# Patient Record
Sex: Male | Born: 1955 | Race: White | Hispanic: No | Marital: Single | Smoking: Never smoker
Health system: Southern US, Community
[De-identification: ages and names within clinical notes are randomized; demographics above are authoritative.]

## PROBLEM LIST (undated history)

## (undated) DIAGNOSIS — I219 Acute myocardial infarction, unspecified: Secondary | ICD-10-CM

## (undated) DIAGNOSIS — N186 End stage renal disease: Secondary | ICD-10-CM

## (undated) DIAGNOSIS — E119 Type 2 diabetes mellitus without complications: Secondary | ICD-10-CM

## (undated) DIAGNOSIS — I480 Paroxysmal atrial fibrillation: Secondary | ICD-10-CM

## (undated) DIAGNOSIS — G4733 Obstructive sleep apnea (adult) (pediatric): Secondary | ICD-10-CM

## (undated) DIAGNOSIS — I5030 Unspecified diastolic (congestive) heart failure: Secondary | ICD-10-CM

## (undated) DIAGNOSIS — N289 Disorder of kidney and ureter, unspecified: Secondary | ICD-10-CM

## (undated) HISTORY — PX: DIALYSIS FISTULA CREATION: SHX611

## (undated) HISTORY — PX: TOE AMPUTATION: SHX809

---

## 2015-07-11 HISTORY — PX: CORONARY ARTERY BYPASS GRAFT: SHX141

## 2020-01-17 ENCOUNTER — Emergency Department (HOSPITAL_COMMUNITY): Payer: Medicare Other

## 2020-01-17 ENCOUNTER — Encounter (HOSPITAL_COMMUNITY): Payer: Self-pay

## 2020-01-17 ENCOUNTER — Emergency Department (HOSPITAL_COMMUNITY)
Admission: EM | Admit: 2020-01-17 | Discharge: 2020-01-17 | Disposition: A | Discharge status: Home / Self Care | Payer: Medicare Other | Attending: Emergency Medicine | Admitting: Emergency Medicine

## 2020-01-17 ENCOUNTER — Other Ambulatory Visit: Payer: Self-pay

## 2020-01-17 DIAGNOSIS — E1122 Type 2 diabetes mellitus with diabetic chronic kidney disease: Secondary | ICD-10-CM | POA: Insufficient documentation

## 2020-01-17 DIAGNOSIS — S0990XA Unspecified injury of head, initial encounter: Secondary | ICD-10-CM | POA: Diagnosis not present

## 2020-01-17 DIAGNOSIS — Z23 Encounter for immunization: Secondary | ICD-10-CM | POA: Insufficient documentation

## 2020-01-17 DIAGNOSIS — Z794 Long term (current) use of insulin: Secondary | ICD-10-CM | POA: Insufficient documentation

## 2020-01-17 DIAGNOSIS — E1165 Type 2 diabetes mellitus with hyperglycemia: Secondary | ICD-10-CM | POA: Insufficient documentation

## 2020-01-17 DIAGNOSIS — Z89421 Acquired absence of other right toe(s): Secondary | ICD-10-CM | POA: Insufficient documentation

## 2020-01-17 DIAGNOSIS — N186 End stage renal disease: Secondary | ICD-10-CM | POA: Diagnosis not present

## 2020-01-17 DIAGNOSIS — S80812A Abrasion, left lower leg, initial encounter: Secondary | ICD-10-CM | POA: Insufficient documentation

## 2020-01-17 DIAGNOSIS — W19XXXA Unspecified fall, initial encounter: Secondary | ICD-10-CM

## 2020-01-17 DIAGNOSIS — Y9389 Activity, other specified: Secondary | ICD-10-CM | POA: Insufficient documentation

## 2020-01-17 DIAGNOSIS — S90819A Abrasion, unspecified foot, initial encounter: Secondary | ICD-10-CM | POA: Insufficient documentation

## 2020-01-17 DIAGNOSIS — Z992 Dependence on renal dialysis: Secondary | ICD-10-CM

## 2020-01-17 DIAGNOSIS — R739 Hyperglycemia, unspecified: Secondary | ICD-10-CM

## 2020-01-17 DIAGNOSIS — Y999 Unspecified external cause status: Secondary | ICD-10-CM | POA: Insufficient documentation

## 2020-01-17 DIAGNOSIS — Y92410 Unspecified street and highway as the place of occurrence of the external cause: Secondary | ICD-10-CM | POA: Insufficient documentation

## 2020-01-17 DIAGNOSIS — T07XXXA Unspecified multiple injuries, initial encounter: Secondary | ICD-10-CM

## 2020-01-17 LAB — I-STAT CHEM 8, ED
BUN: 47 mg/dL — ABNORMAL HIGH (ref 8–23)
Calcium, Ion: 0.97 mmol/L — ABNORMAL LOW (ref 1.15–1.40)
Chloride: 100 mmol/L (ref 98–111)
Creatinine, Ser: 10.4 mg/dL — ABNORMAL HIGH (ref 0.61–1.24)
Glucose, Bld: 383 mg/dL — ABNORMAL HIGH (ref 70–99)
HCT: 27 % — ABNORMAL LOW (ref 39.0–52.0)
Hemoglobin: 9.2 g/dL — ABNORMAL LOW (ref 13.0–17.0)
Potassium: 5 mmol/L (ref 3.5–5.1)
Sodium: 137 mmol/L (ref 135–145)
TCO2: 23 mmol/L (ref 22–32)

## 2020-01-17 LAB — COMPREHENSIVE METABOLIC PANEL
ALT: 11 U/L (ref 0–44)
AST: 25 U/L (ref 15–41)
Albumin: 3 g/dL — ABNORMAL LOW (ref 3.5–5.0)
Alkaline Phosphatase: 98 U/L (ref 38–126)
Anion gap: 15 (ref 5–15)
BUN: 47 mg/dL — ABNORMAL HIGH (ref 8–23)
CO2: 21 mmol/L — ABNORMAL LOW (ref 22–32)
Calcium: 8.1 mg/dL — ABNORMAL LOW (ref 8.9–10.3)
Chloride: 100 mmol/L (ref 98–111)
Creatinine, Ser: 9.28 mg/dL — ABNORMAL HIGH (ref 0.61–1.24)
GFR calc Af Amer: 6 mL/min — ABNORMAL LOW (ref >60)
GFR calc non Af Amer: 5 mL/min — ABNORMAL LOW (ref >60)
Glucose, Bld: 384 mg/dL — ABNORMAL HIGH (ref 70–99)
Potassium: 5.1 mmol/L (ref 3.5–5.1)
Sodium: 136 mmol/L (ref 135–145)
Total Bilirubin: 0.7 mg/dL (ref 0.3–1.2)
Total Protein: 6.7 g/dL (ref 6.5–8.1)

## 2020-01-17 LAB — CBC
HCT: 28.6 % — ABNORMAL LOW (ref 39.0–52.0)
Hemoglobin: 8.7 g/dL — ABNORMAL LOW (ref 13.0–17.0)
MCH: 29.5 pg (ref 26.0–34.0)
MCHC: 30.4 g/dL (ref 30.0–36.0)
MCV: 96.9 fL (ref 80.0–100.0)
Platelets: 235 10*3/uL (ref 150–400)
RBC: 2.95 MIL/uL — ABNORMAL LOW (ref 4.22–5.81)
RDW: 14.8 % (ref 11.5–15.5)
WBC: 13.2 10*3/uL — ABNORMAL HIGH (ref 4.0–10.5)
nRBC: 0 % (ref 0.0–0.2)

## 2020-01-17 LAB — PROTIME-INR
INR: 1.1 (ref 0.8–1.2)
Prothrombin Time: 13.7 seconds (ref 11.4–15.2)

## 2020-01-17 LAB — SAMPLE TO BLOOD BANK

## 2020-01-17 IMAGING — DX DG CHEST 1V PORT
1 series · 1 of 1 positions shown · non-contrast
Comparison: None.

CLINICAL DATA: Acute pain due to trauma

EXAM:
PORTABLE CHEST 1 VIEW

[chest ap]
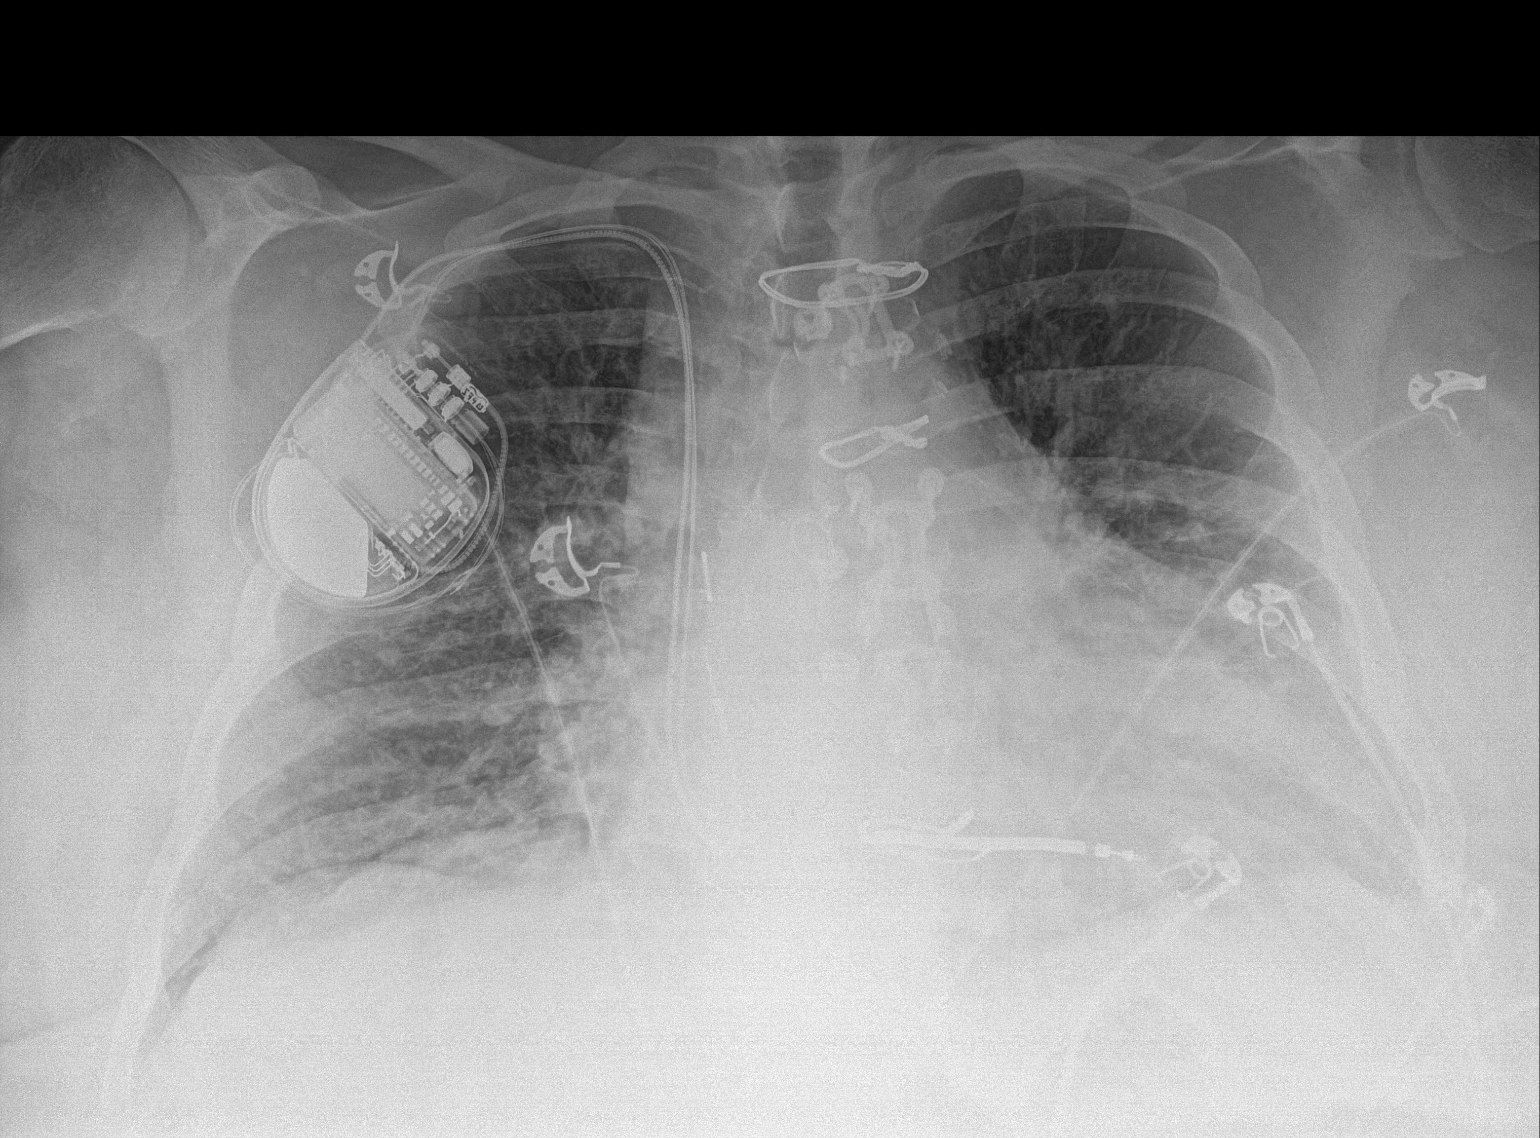

[1 of 1 positions shown; findings below may reference images not displayed]

FINDINGS: The heart size is enlarged. The patient is status post prior median
sternotomy. There is a dual chamber right-sided pacemaker/ICD in
place. There is vascular congestion with developing interstitial
edema. There is no pneumothorax. Bibasilar atelectasis is noted.
IMPRESSION: 1. Cardiomegaly with developing interstitial edema.
2. Bibasilar airspace opacities favored to represent atelectasis.

## 2020-01-17 IMAGING — CT CT HEAD W/O CM
4 series · 16 of 47 positions shown, 18 images · non-contrast
Comparison: None.

CLINICAL DATA: Fell out of wheelchair approximately 15-20 feet,
anticoagulated

EXAM:
CT HEAD WITHOUT CONTRAST
CT CERVICAL SPINE WITHOUT CONTRAST
TECHNIQUE: Multidetector CT imaging of the head and cervical spine was
performed following the standard protocol without intravenous
contrast. Multiplanar CT image reconstructions of the cervical spine
were also generated.

[Series 3: head wo · axial · 0.41mm/px · z∈[-122,-2]mm · 7 of 34 slices shown, 9 images]
[im 5/34  brain]
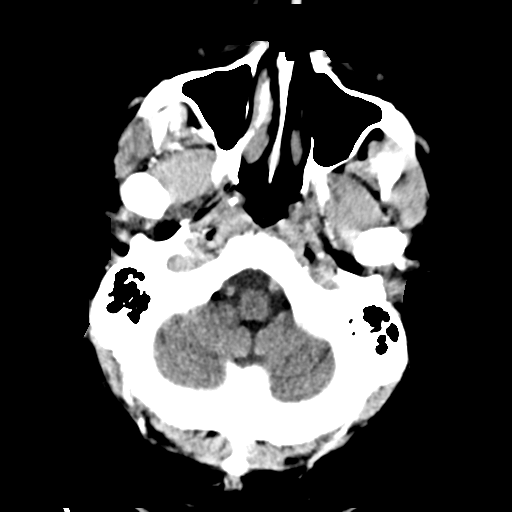
[im 5/34  bone]
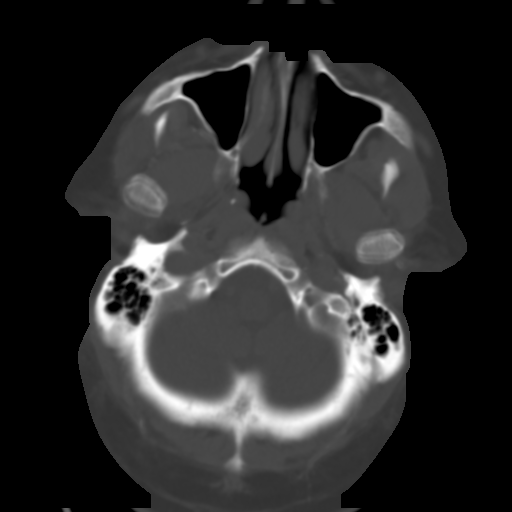
[im 9/34  brain]
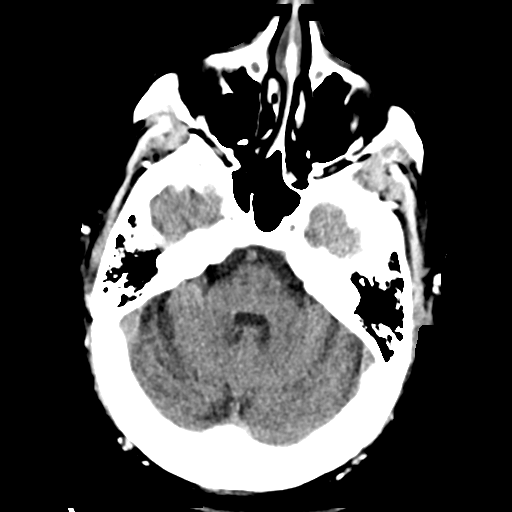
[im 13/34  brain]
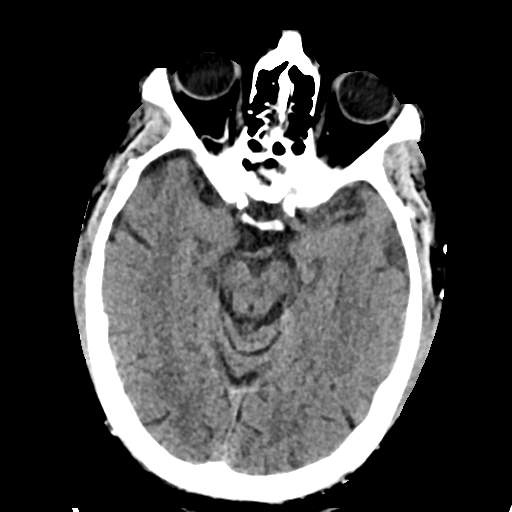
[im 17/34  brain]
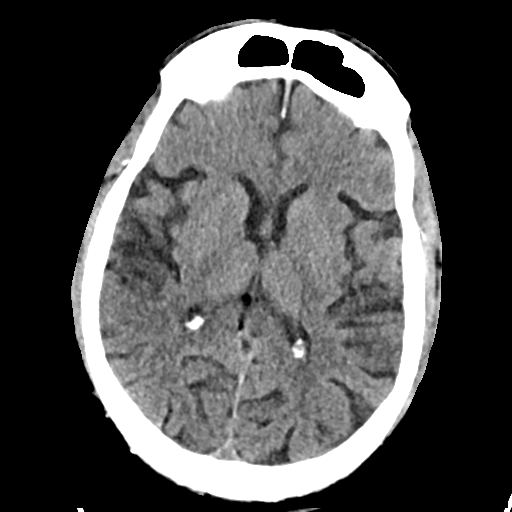
[im 21/34  brain]
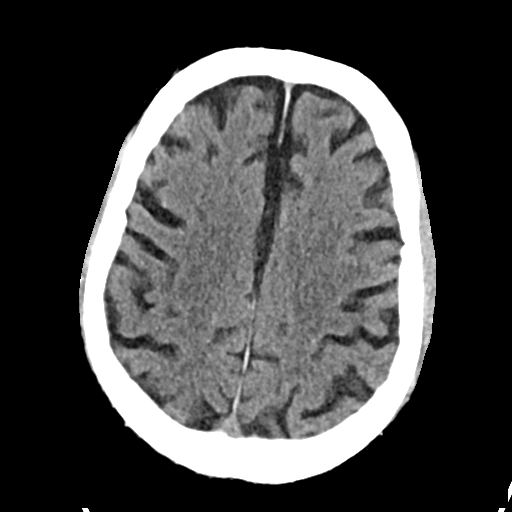
[im 21/34  bone]
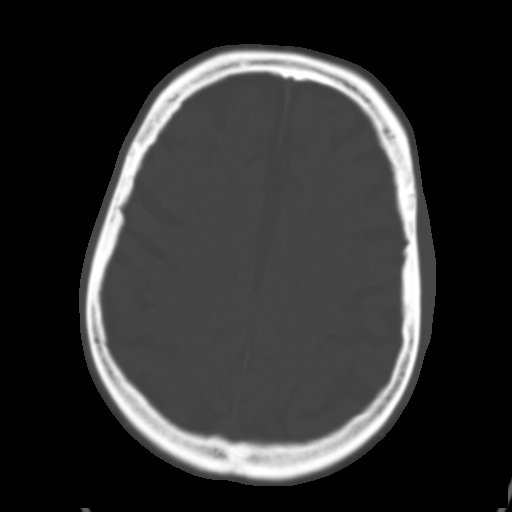
[im 25/34  brain]
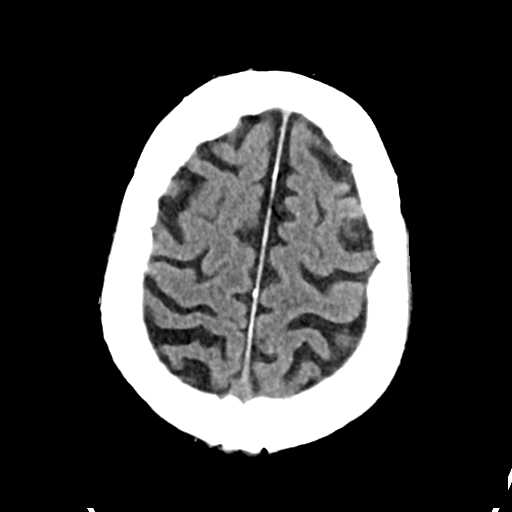
[im 29/34  brain]
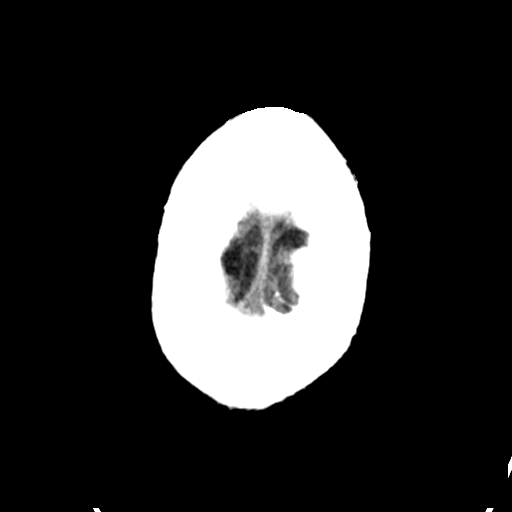

[Series 4: head bone · axial · 0.41mm/px · z∈[-126,-92]mm · 3 of 85 slices shown]
[im 9/85  bone]
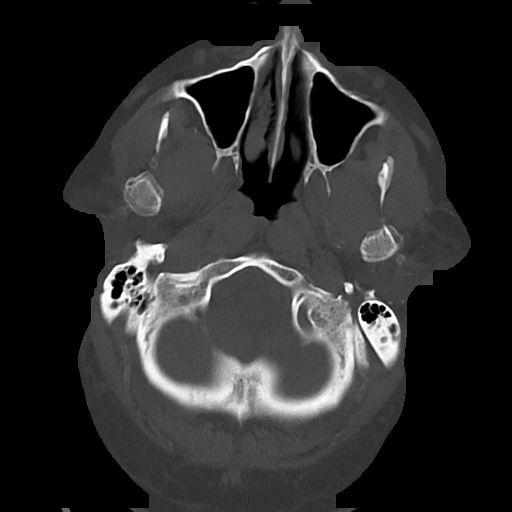
[im 17/85  bone]
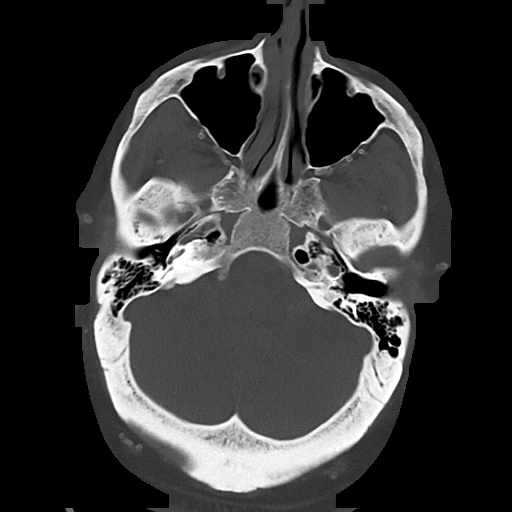
[im 26/85  bone]
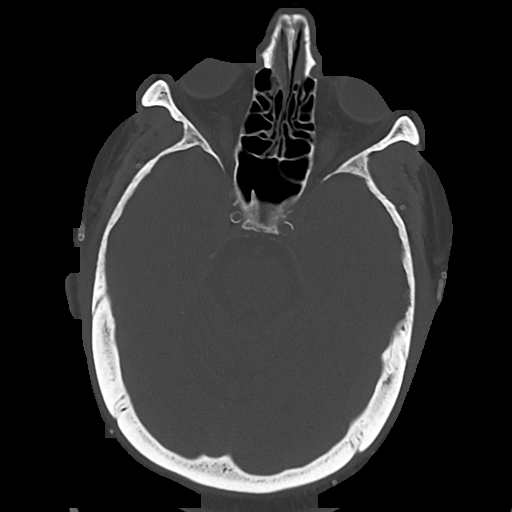

[Series 5: cor soft · coronal · 0.33mm/px · 3 of 79 slices shown]
[im 27/79  brain]
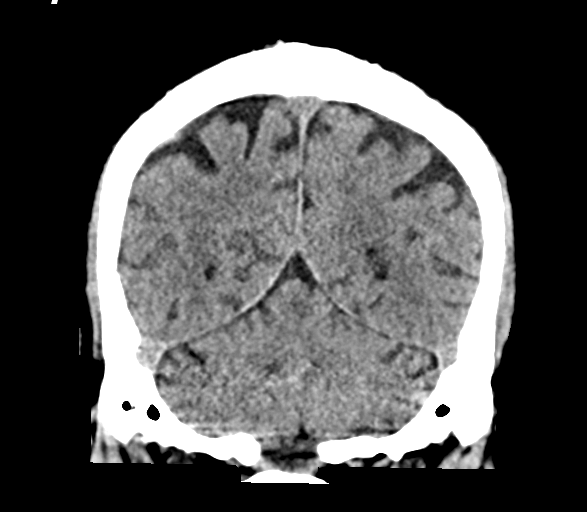
[im 35/79  brain]
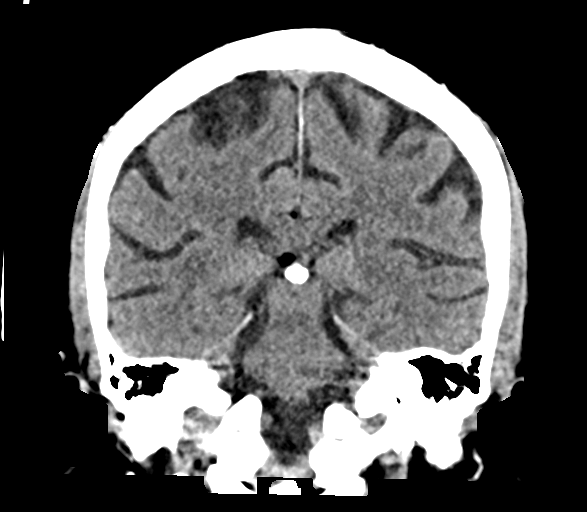
[im 44/79  brain]
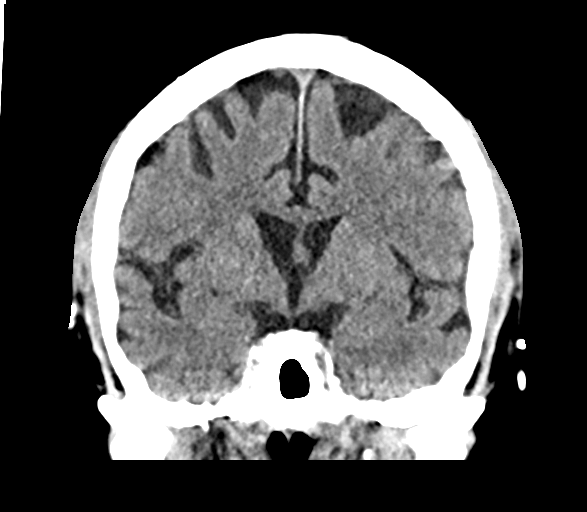

[Series 6: sag soft · sagittal · 0.33mm/px · 3 of 66 slices shown]
[im 22/66  brain]
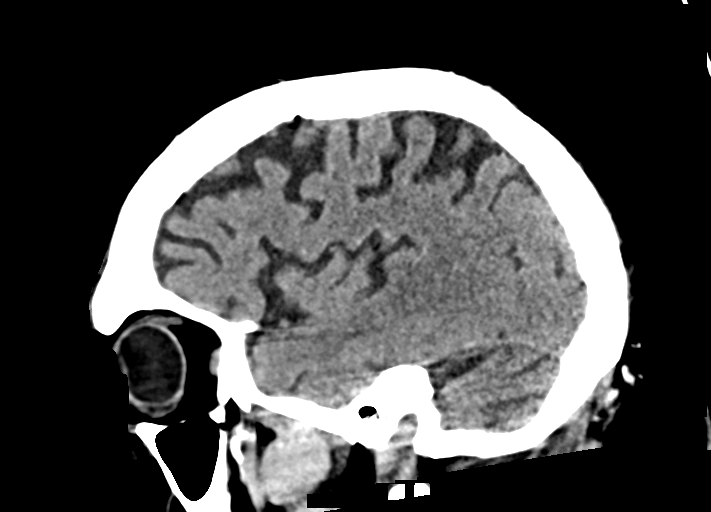
[im 33/66  brain]
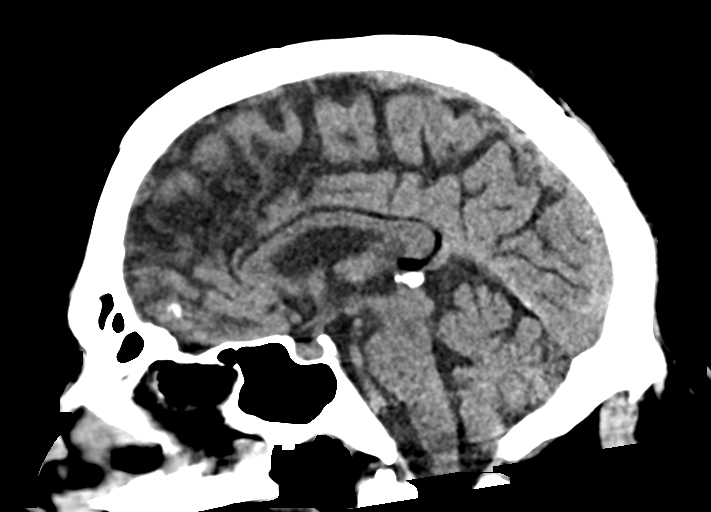
[im 44/66  brain]
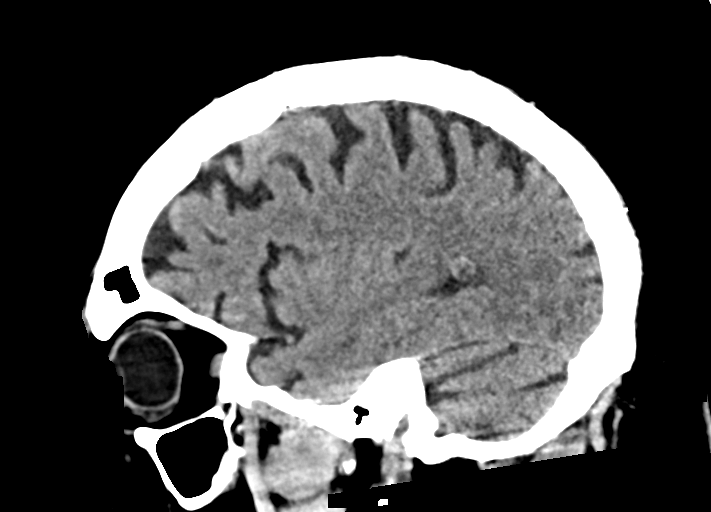

[16 of 47 positions shown; findings below may reference images not displayed]

FINDINGS: CT HEAD FINDINGS

Brain: No acute infarct or hemorrhage. Lateral ventricles and
midline structures are unremarkable. No acute extra-axial fluid
collections. No mass effect.

Vascular: No hyperdense vessel or unexpected calcification.

Skull: Normal. Negative for fracture or focal lesion.

Sinuses/Orbits: No acute finding.

Other: None.

CT CERVICAL SPINE FINDINGS

Alignment: Alignment is grossly anatomic.

Skull base and vertebrae: No acute displaced fractures.

Soft tissues and spinal canal: No prevertebral fluid or swelling. No
visible canal hematoma.

Disc levels: There is mild spondylosis at C3-4. Left predominant
facet hypertrophic changes are seen at C2-3, C3-4, and C4-5. These
changes result in left greater than right neural foraminal
encroachment at C2-3 and C3-4.

Upper chest: Airway is patent. Lung apices are clear.

Other: Reconstructed images demonstrate no additional findings.
IMPRESSION: 1. No acute intracranial process.
2. No acute cervical spine fracture.

## 2020-01-17 IMAGING — DX DG FOOT COMPLETE 3+V*L*
3 series · 3 of 3 positions shown · non-contrast
Comparison: None.

CLINICAL DATA: Pain.

EXAM:
LEFT FOOT - COMPLETE 3+ VIEW

[foot ap]
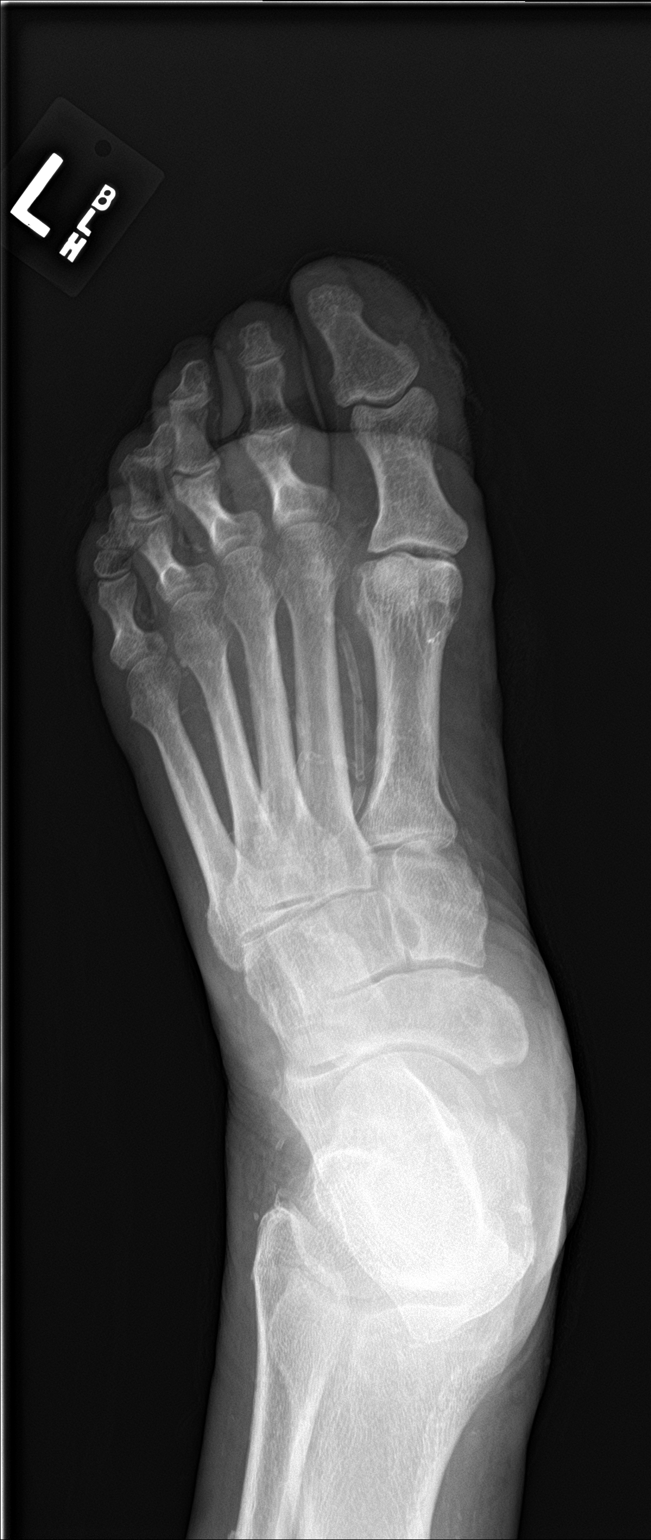

[foot obl]
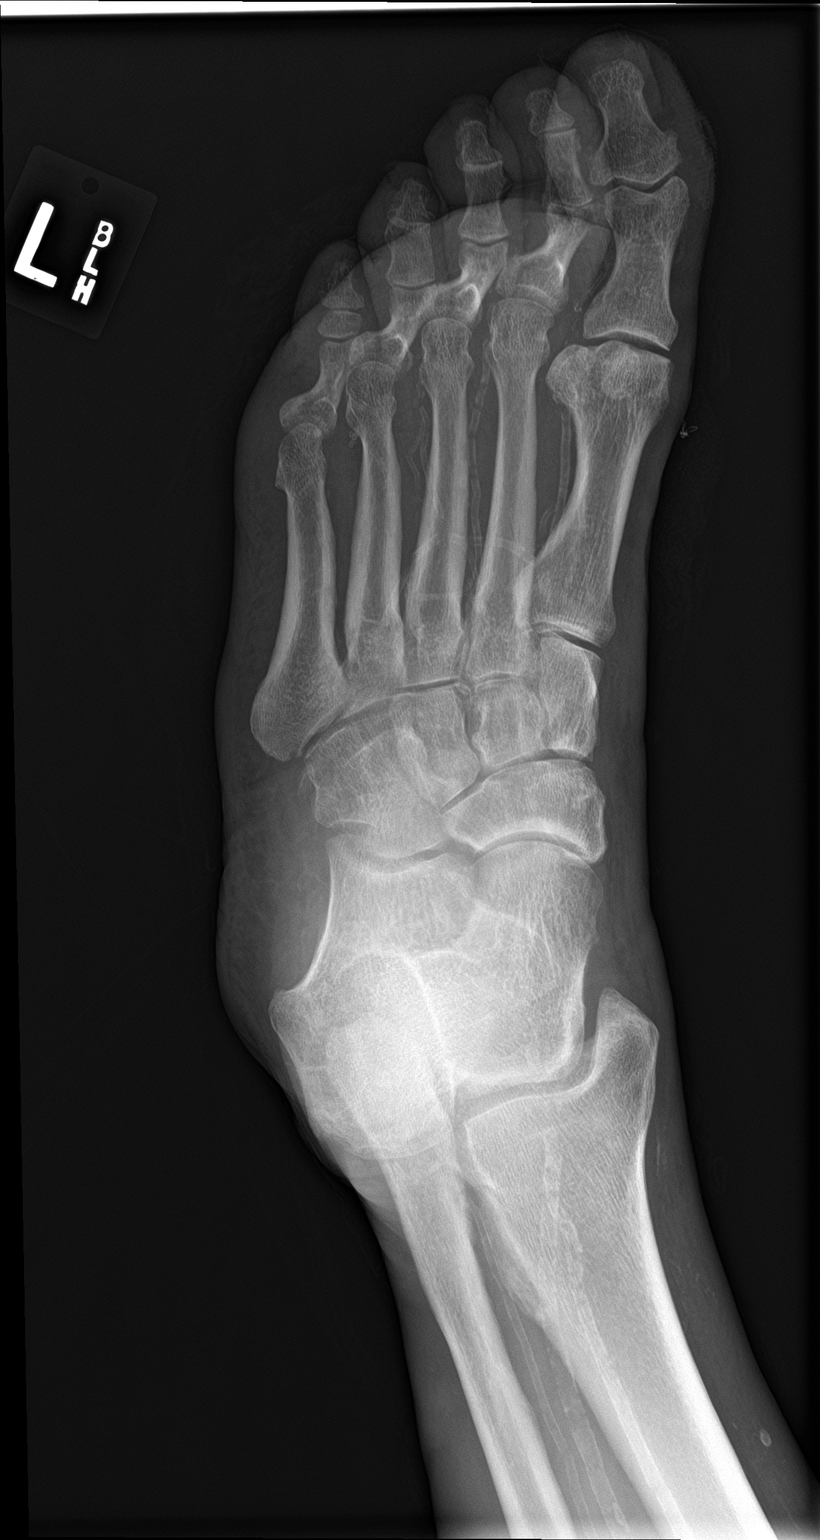

[foot lat]
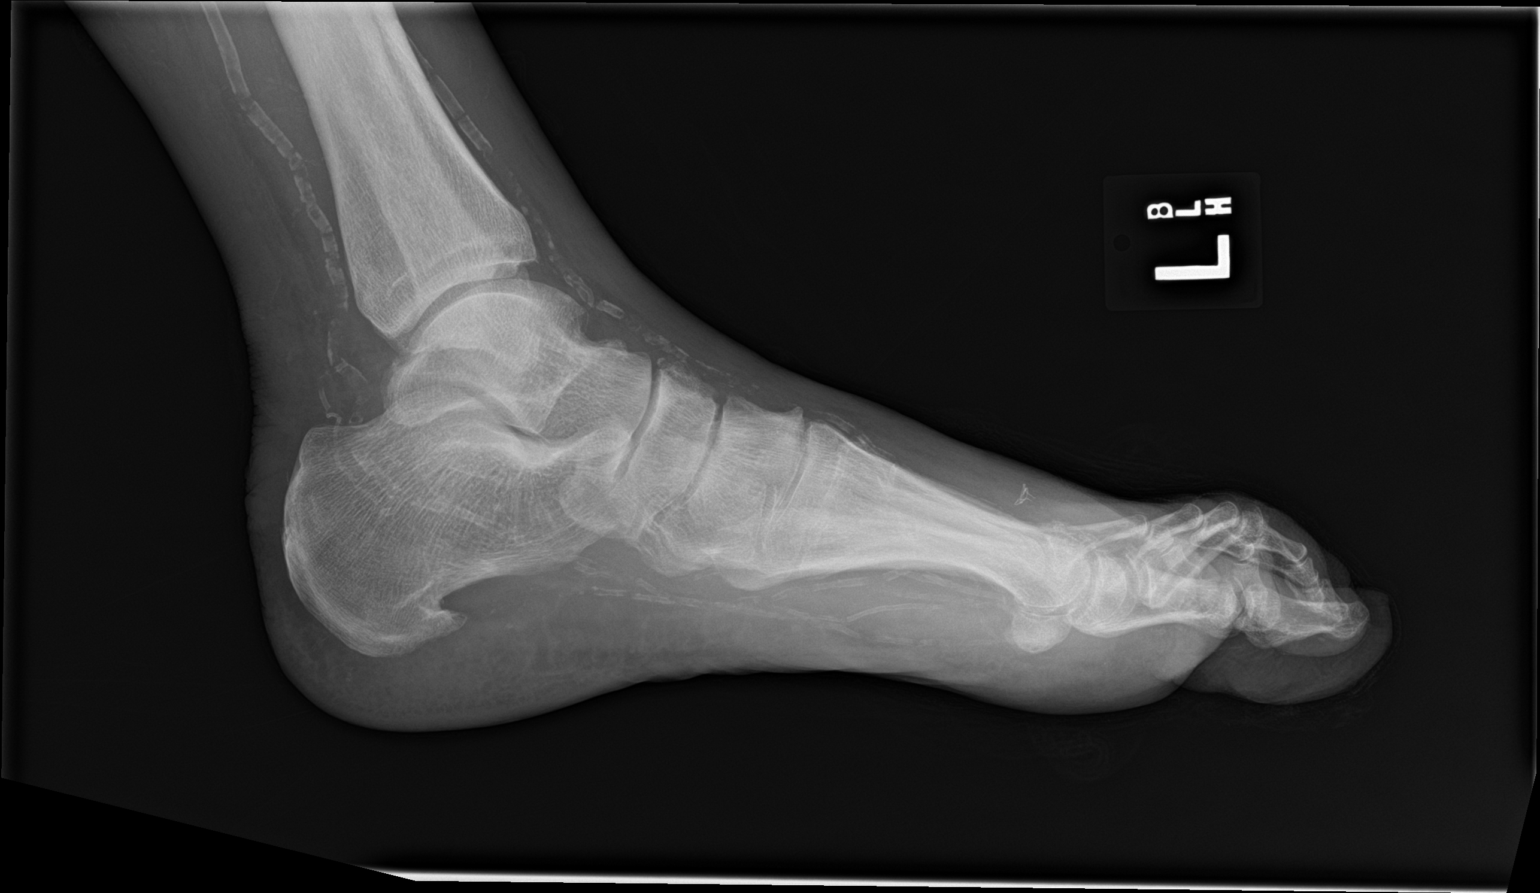

[3 of 3 positions shown; findings below may reference images not displayed]

FINDINGS: There is no acute displaced fracture or dislocation. There is soft
tissue swelling about the medial aspect of the distal first digit.
There is no radiopaque foreign body. Advanced vascular
calcifications are noted.
IMPRESSION: No acute displaced fracture or dislocation. Soft tissue swelling
about the medial aspect of the distal first digit.

## 2020-01-17 IMAGING — DX DG TIBIA/FIBULA 2V*L*
3 series · 3 of 3 positions shown · non-contrast
Comparison: None.

CLINICAL DATA: Pain status post trauma

EXAM:
LEFT TIBIA AND FIBULA - 2 VIEW

[tibia ap]
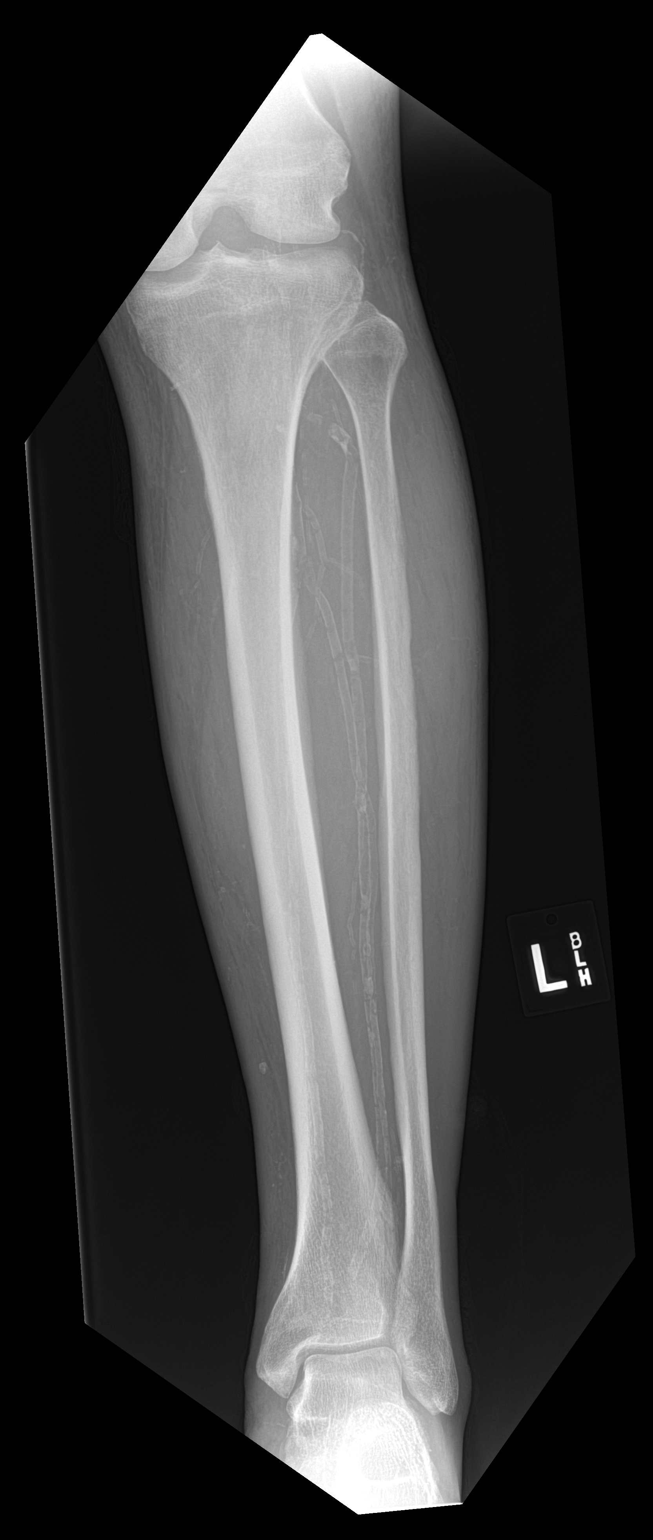

[tibia lat (1 of 2)]
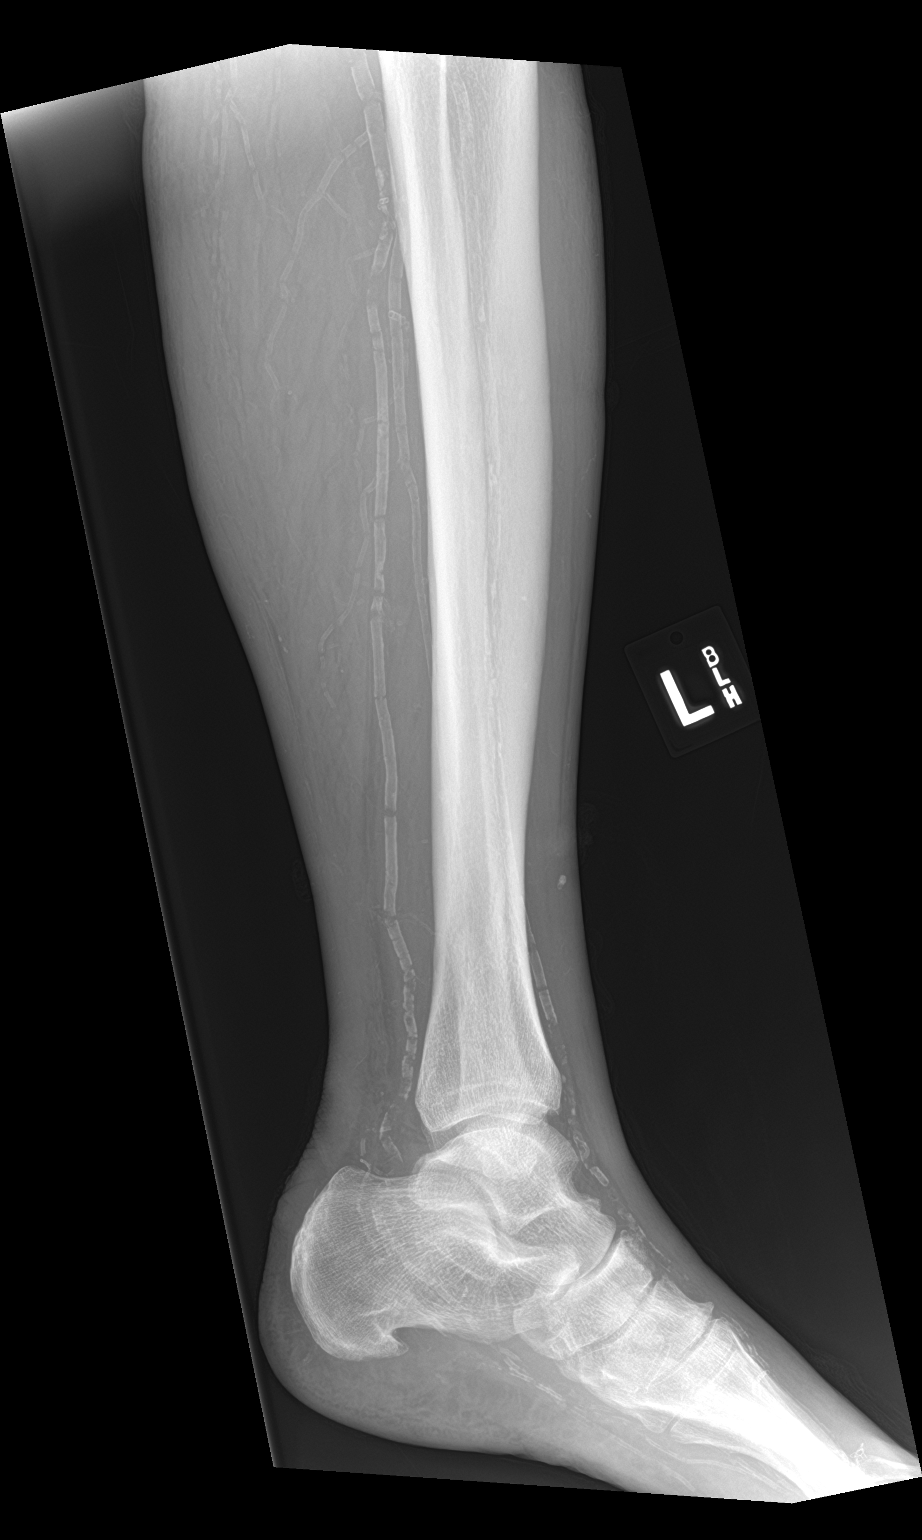

[tibia lat (2 of 2)]
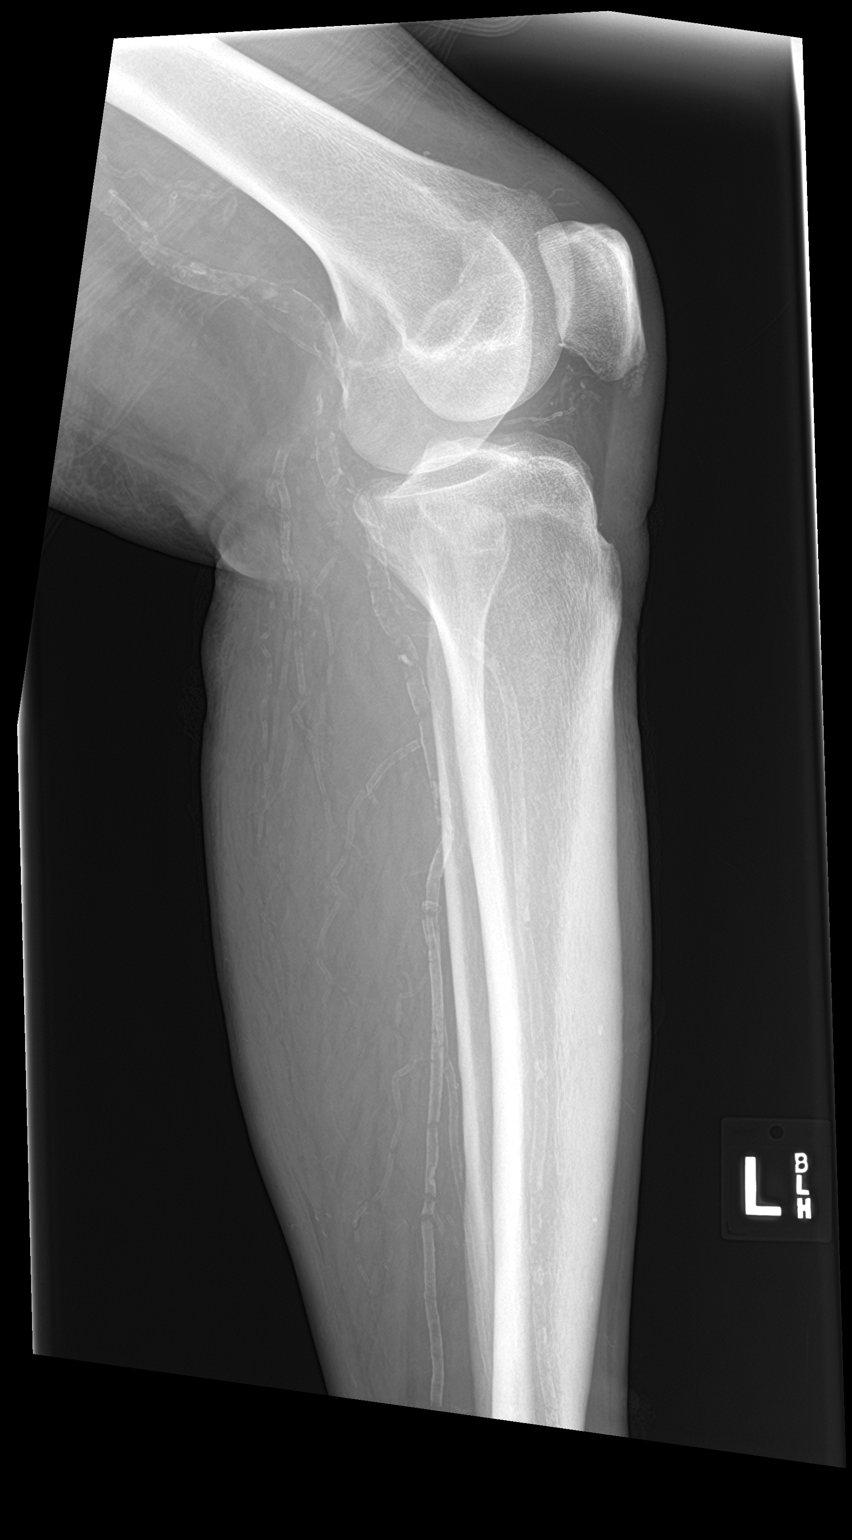

[3 of 3 positions shown; findings below may reference images not displayed]

FINDINGS: There is no evidence of fracture or other focal bone lesions. Soft
tissues are unremarkable. Advanced vascular calcifications are
noted.
IMPRESSION: 1. No acute bony abnormality.
2. Advanced vascular calcifications.

## 2020-01-17 IMAGING — CT CT CERVICAL SPINE W/O CM
3 of 4 series · 13 of 33 positions shown, 16 images · non-contrast
Comparison: None.

CLINICAL DATA: Fell out of wheelchair approximately 15-20 feet,
anticoagulated

EXAM:
CT HEAD WITHOUT CONTRAST
CT CERVICAL SPINE WITHOUT CONTRAST
TECHNIQUE: Multidetector CT imaging of the head and cervical spine was
performed following the standard protocol without intravenous
contrast. Multiplanar CT image reconstructions of the cervical spine
were also generated.

[Series 8: sag bone · sagittal · 0.25mm/px · 5 of 75 slices shown, 6 images]
[im 25/75  bone]
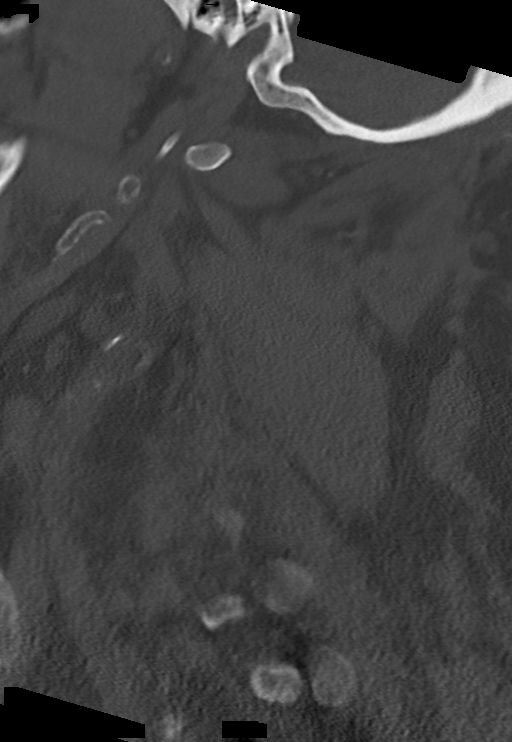
[im 31/75  bone]
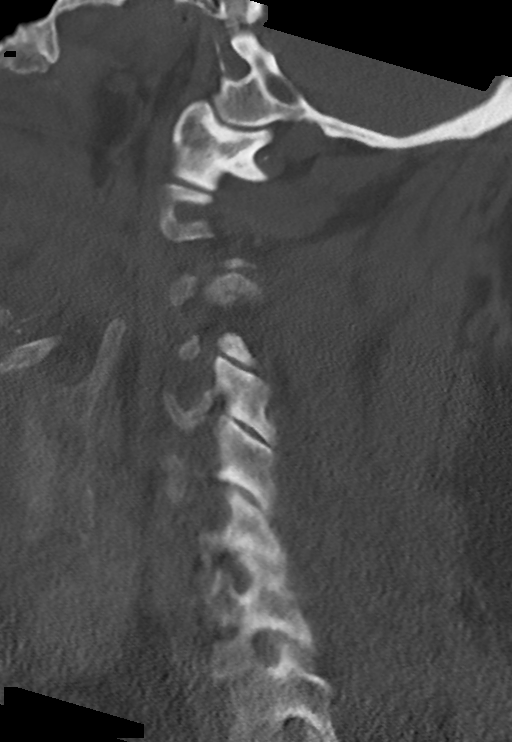
[im 38/75  soft-tissue]
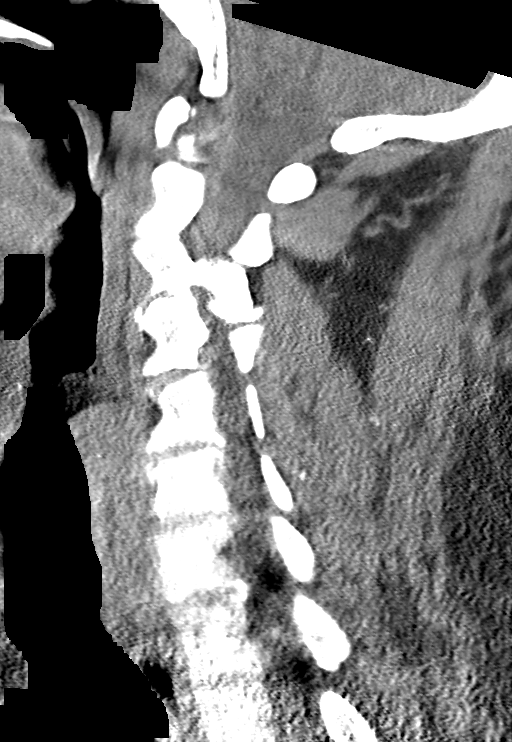
[im 38/75  bone]
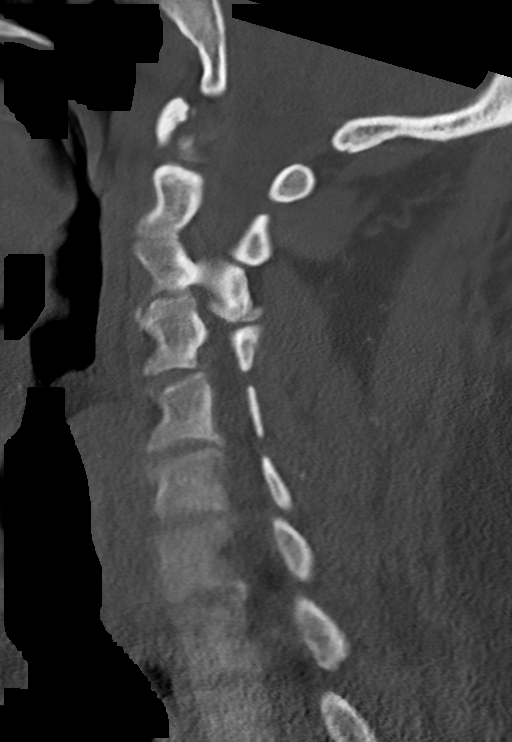
[im 44/75  bone]
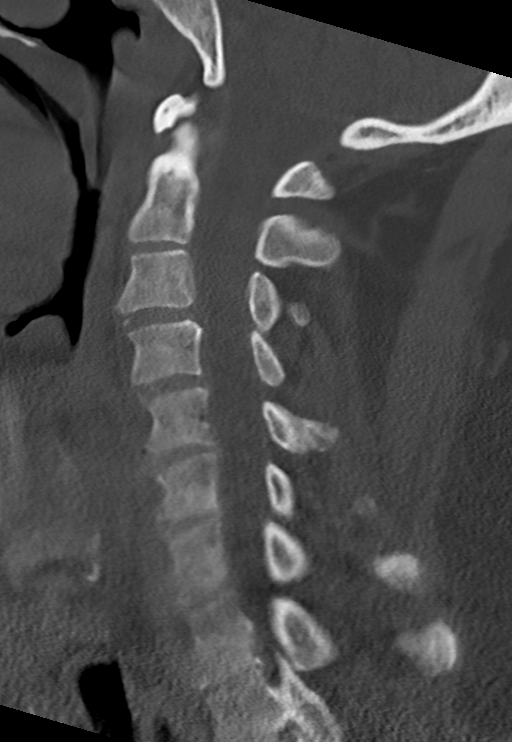
[im 50/75  bone]
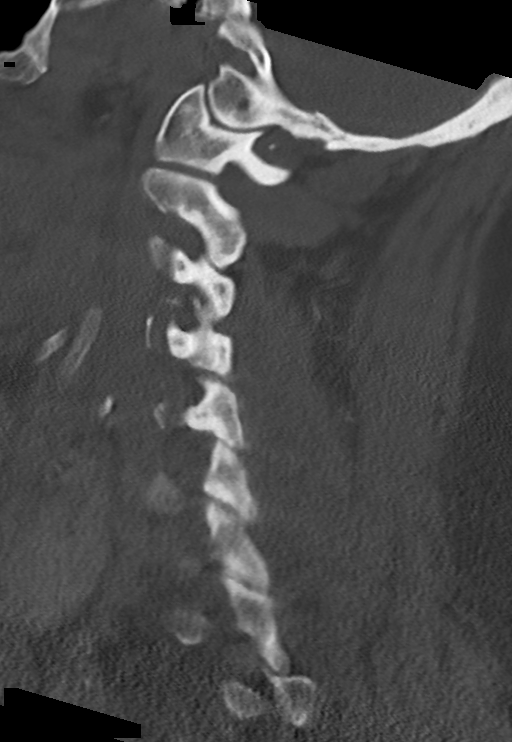

[Series 9: cor bone · coronal · 0.27mm/px · 3 of 63 slices shown]
[im 13/63  bone]
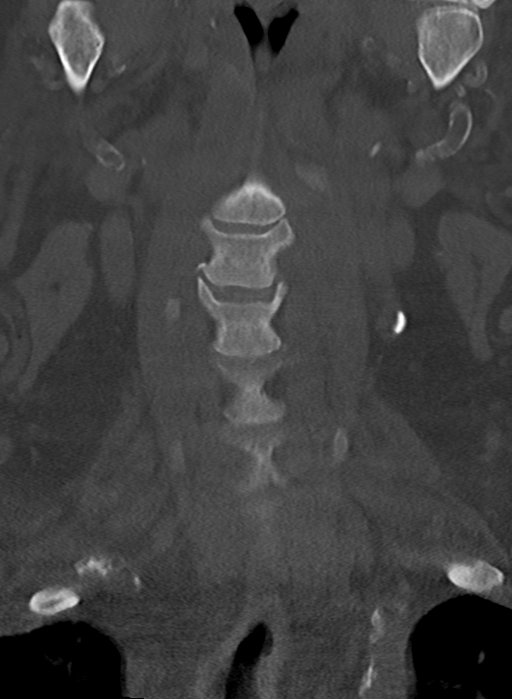
[im 25/63  bone]
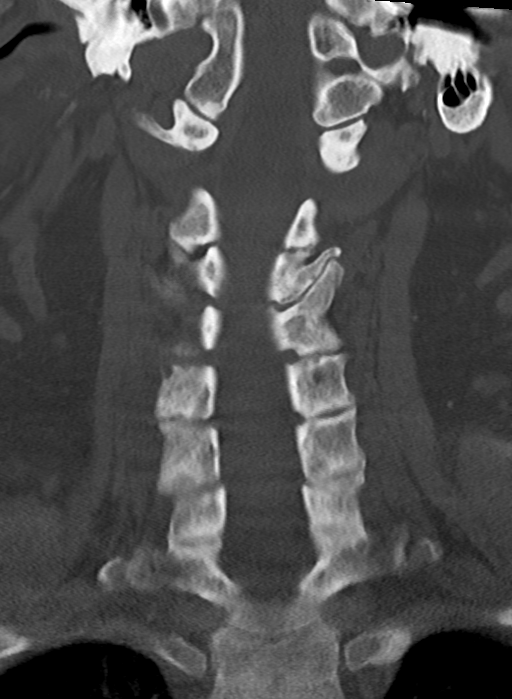
[im 38/63  bone]
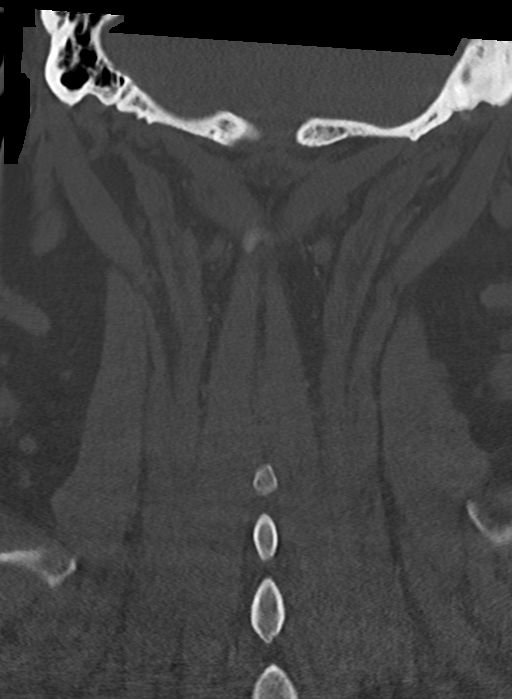

[Series 10: orthogonal axials · axial · 0.21mm/px · z∈[-282,-152]mm · 5 of 95 slices shown, 7 images]
[im 16/95  soft-tissue]
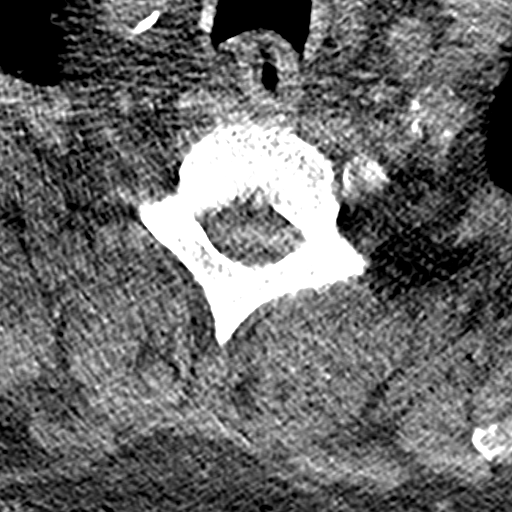
[im 16/95  bone]
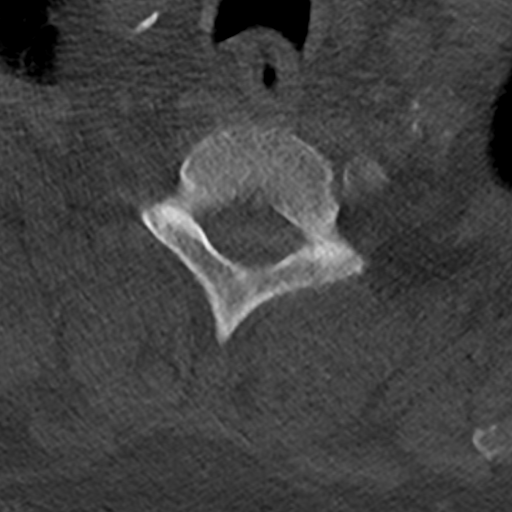
[im 32/95  bone]
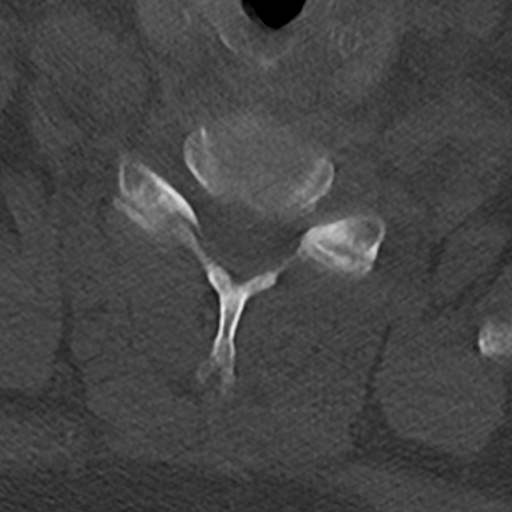
[im 48/95  bone]
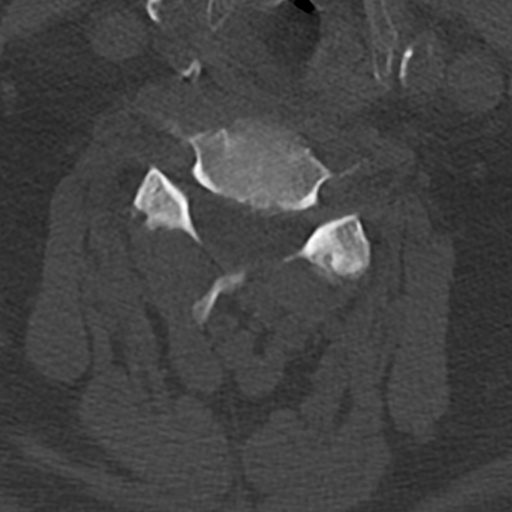
[im 63/95  bone]
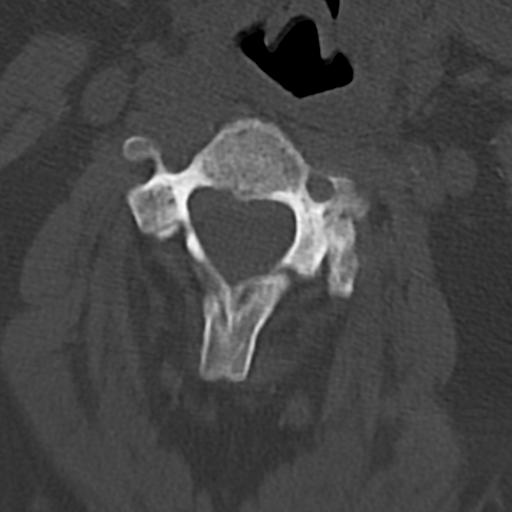
[im 79/95  soft-tissue]
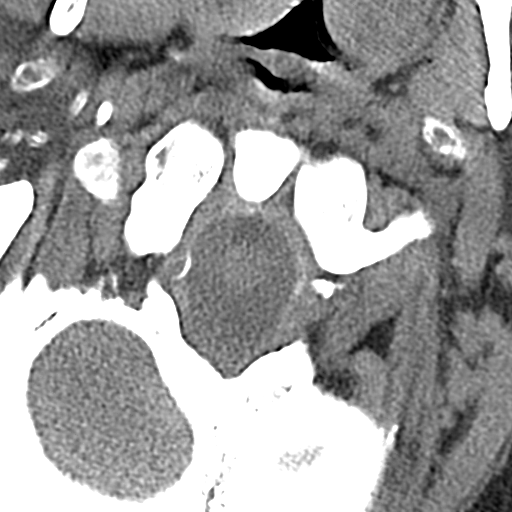
[im 79/95  bone]
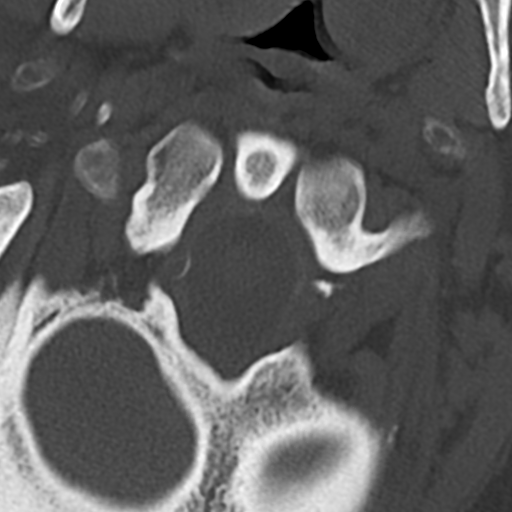

[13 of 33 positions shown; findings below may reference images not displayed]

FINDINGS: CT HEAD FINDINGS

Brain: No acute infarct or hemorrhage. Lateral ventricles and
midline structures are unremarkable. No acute extra-axial fluid
collections. No mass effect.

Vascular: No hyperdense vessel or unexpected calcification.

Skull: Normal. Negative for fracture or focal lesion.

Sinuses/Orbits: No acute finding.

Other: None.

CT CERVICAL SPINE FINDINGS

Alignment: Alignment is grossly anatomic.

Skull base and vertebrae: No acute displaced fractures.

Soft tissues and spinal canal: No prevertebral fluid or swelling. No
visible canal hematoma.

Disc levels: There is mild spondylosis at C3-4. Left predominant
facet hypertrophic changes are seen at C2-3, C3-4, and C4-5. These
changes result in left greater than right neural foraminal
encroachment at C2-3 and C3-4.

Upper chest: Airway is patent. Lung apices are clear.

Other: Reconstructed images demonstrate no additional findings.
IMPRESSION: 1. No acute intracranial process.
2. No acute cervical spine fracture.

## 2020-01-17 IMAGING — DX DG FOOT COMPLETE 3+V*R*
3 series · 3 of 3 positions shown · non-contrast
Comparison: None.

CLINICAL DATA: Acute pain due to trauma

EXAM:
RIGHT FOOT COMPLETE - 3+ VIEW

[foot ap]
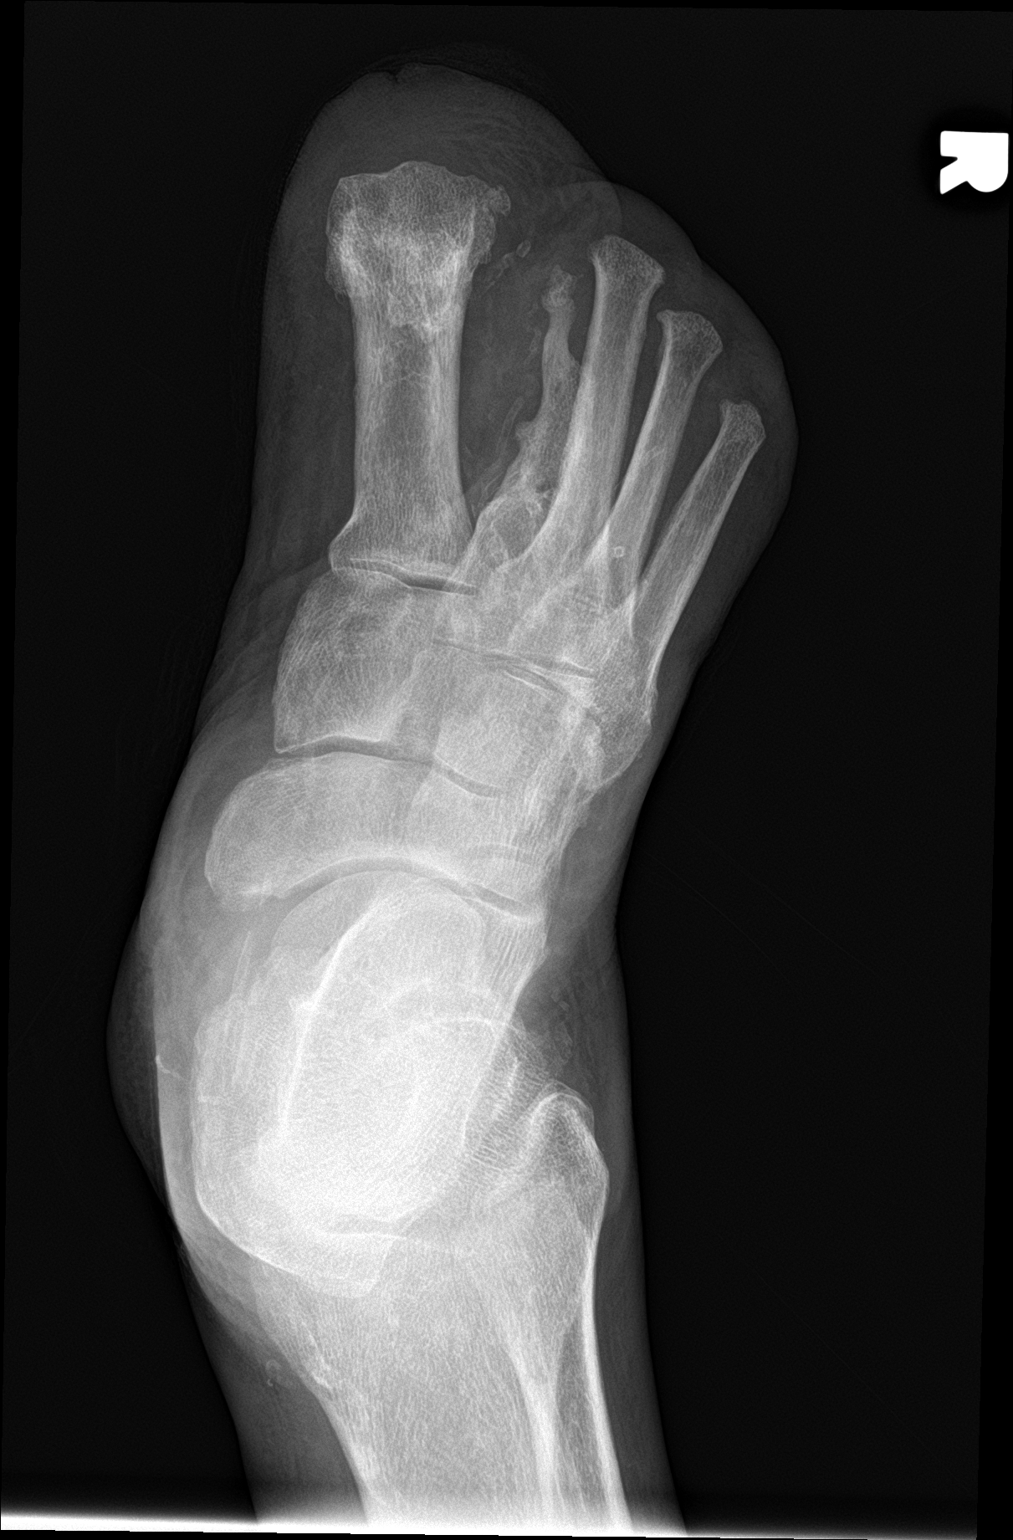

[foot obl]
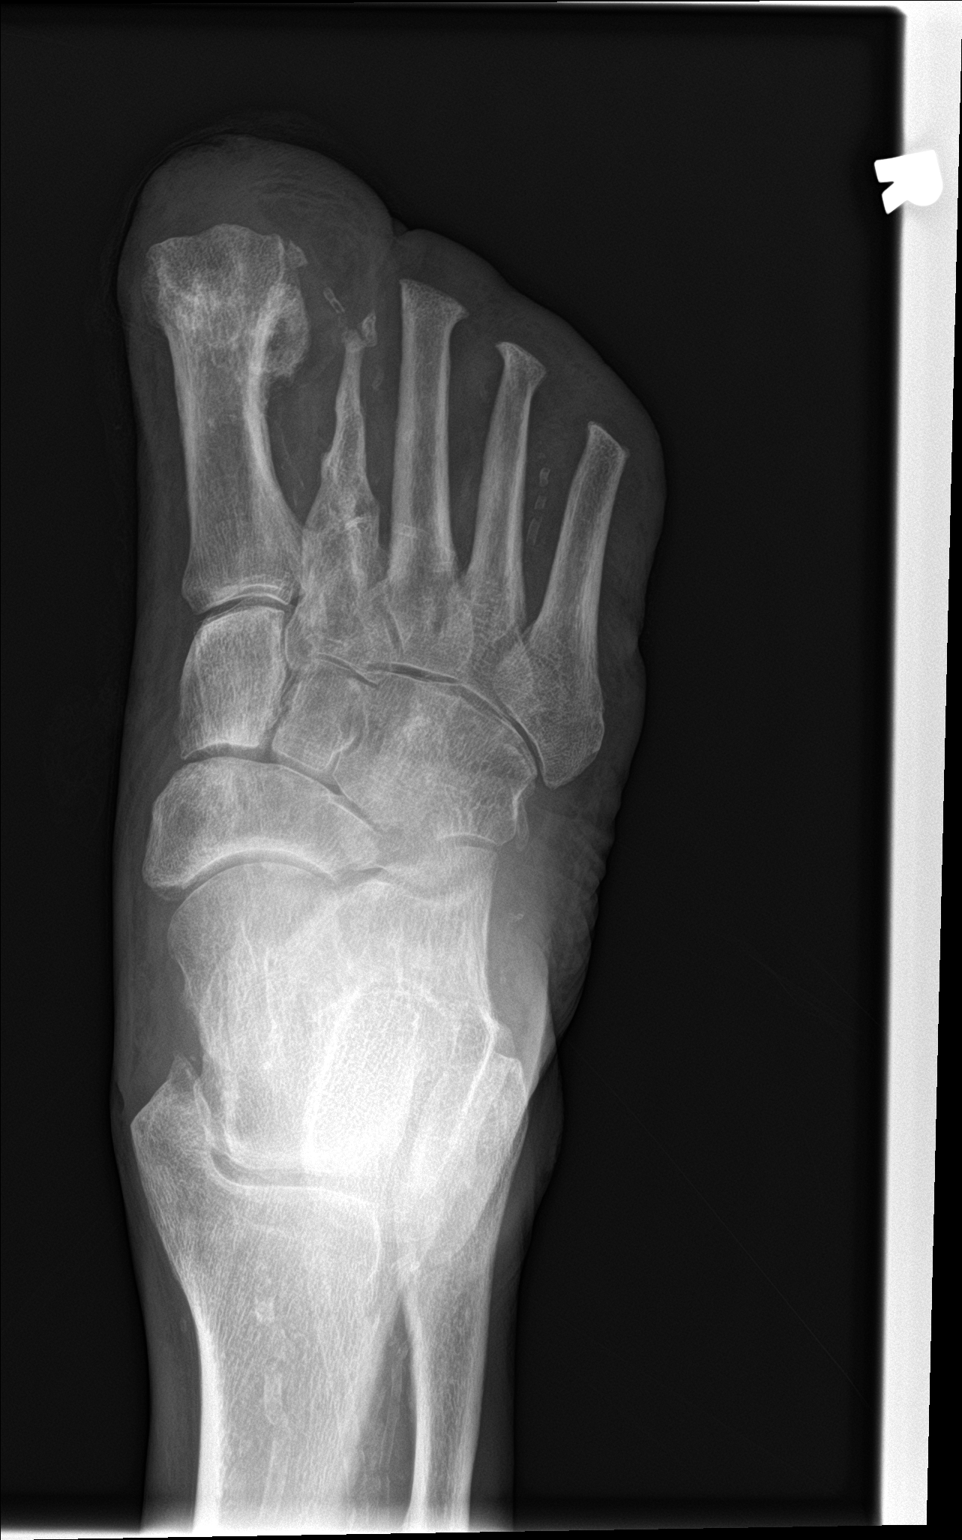

[foot lat]
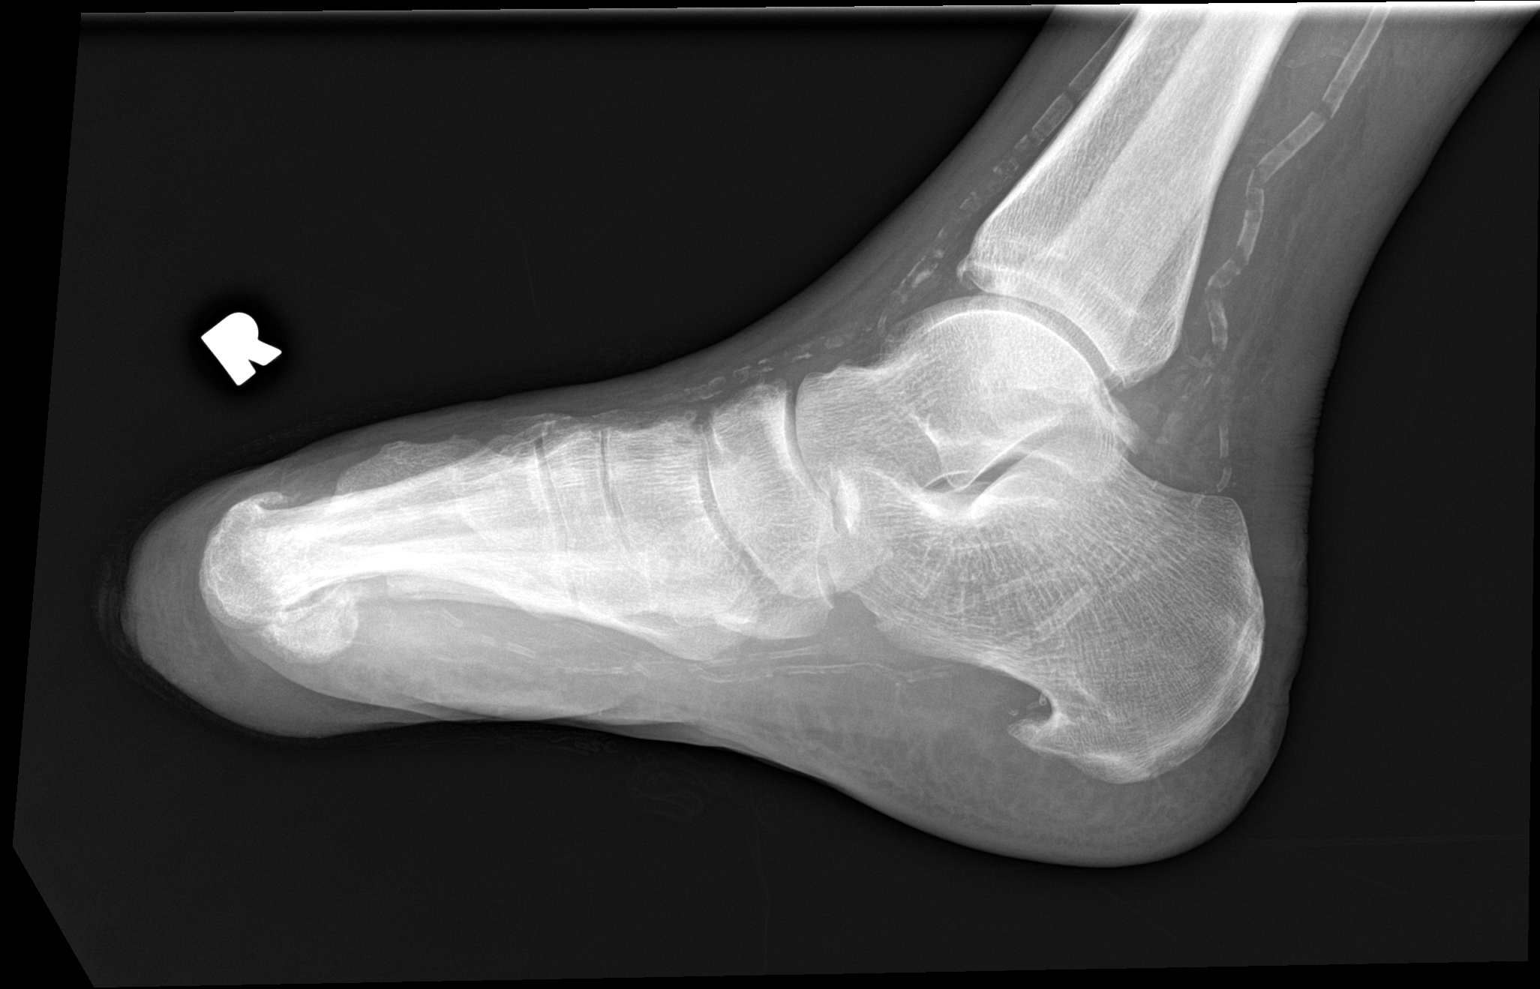

[3 of 3 positions shown; findings below may reference images not displayed]

FINDINGS: The patient is status post prior amputation of the phalanges. There
is nonspecific soft tissue swelling. There is a chronic appearing
deformity of the remaining second metatarsal. There appears to be an
ulcer at the level of the first digit. No definite acute displaced
fracture or dislocation. There is no definite evidence for
osteomyelitis. Advanced vascular calcifications are noted.
IMPRESSION: 1. No acute displaced fracture or dislocation.
2. Status post prior amputation.
3. There is an ulcer at the level of the first digit. No
radiographic evidence for osteomyelitis.

## 2020-01-17 MED ORDER — TETANUS-DIPHTH-ACELL PERTUSSIS 5-2.5-18.5 LF-MCG/0.5 IM SUSP
0.5000 mL | Freq: Once | INTRAMUSCULAR | Status: AC
  Administered 2020-01-17: 0.5 mL via INTRAMUSCULAR
  Filled 2020-01-17: qty 0.5

## 2020-01-17 MED ORDER — ONDANSETRON HCL 4 MG/2ML IJ SOLN
4.0000 mg | Freq: Once | INTRAMUSCULAR | Status: AC
  Administered 2020-01-17: 4 mg via INTRAVENOUS
  Filled 2020-01-17: qty 2

## 2020-01-17 MED ORDER — HYDROCODONE-ACETAMINOPHEN 5-325 MG PO TABS: 1.0000 | ORAL_TABLET | Freq: Four times a day (QID) | ORAL | 0 refills | Status: DC | PRN

## 2020-01-17 MED ORDER — SODIUM CHLORIDE 0.9 % IV SOLN: INTRAVENOUS | Status: DC

## 2020-01-17 MED ORDER — CEFAZOLIN SODIUM-DEXTROSE 1-4 GM/50ML-% IV SOLN
1.0000 g | Freq: Once | INTRAVENOUS | Status: AC
  Administered 2020-01-17: 1 g via INTRAVENOUS
  Filled 2020-01-17: qty 50

## 2020-01-17 MED ORDER — HYDROMORPHONE HCL 1 MG/ML IJ SOLN
0.5000 mg | Freq: Once | INTRAMUSCULAR | Status: AC
  Administered 2020-01-17: 0.5 mg via INTRAVENOUS
  Filled 2020-01-17: qty 1

## 2020-01-17 MED ORDER — HYDROMORPHONE HCL 1 MG/ML IJ SOLN
1.0000 mg | Freq: Once | INTRAMUSCULAR | Status: AC
  Administered 2020-01-17: 1 mg via INTRAVENOUS
  Filled 2020-01-17: qty 1

## 2020-01-17 NOTE — ED Notes (Signed)
Patient verbalizes understanding of discharge instructions. Opportunity for questioning and answers were provided. Armband removed by staff, pt discharged from ED in wheelchair to home.   

## 2020-01-17 NOTE — ED Notes (Signed)
Portable XRAY performed, pt transported to CT at this time.

## 2020-01-17 NOTE — ED Notes (Signed)
Ask patient for urine sample patient stated that he did not make urine.

## 2020-01-17 NOTE — ED Notes (Signed)
Pt transported to CT ?

## 2020-01-17 NOTE — ED Notes (Signed)
Wounds dressed by this RN as requested by MD

## 2020-01-17 NOTE — Progress Notes (Signed)
Orthopedic Tech Progress Note Patient Details:  Kyle Walton 11/01/1955 [id number] LEVEL 2 TRAUMA Patient ID: [name], male   DOB: 02-27-1956, 64 y.o.   MRN: [id number]   [name] 01/17/2020, 4:11 PM
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
## 2020-01-17 NOTE — Discharge Instructions (Addendum)
Soak your feet warm water for 20 minutes twice a day.  Then apply antibiotic ointment and redress.  Take the pain medication as needed for pain.  Continue with your dialysis.  Return for any new or worse symptoms.  Make an appointment with the wellness clinic for follow-up of your diabetes as well as her wounds.
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
## 2020-01-17 NOTE — ED Notes (Signed)
Per EDP, BP is stable and not considered an issue at this time.

## 2020-01-17 NOTE — Consult Note (Signed)
Responded to page, pt unavailable, no family present, pls call again if further need of chaplain services.  Rev. Eloise Levels Chaplain

## 2020-10-26 ENCOUNTER — Emergency Department (HOSPITAL_COMMUNITY)
Admission: EM | Admit: 2020-10-26 | Discharge: 2020-11-01 | Disposition: A | Discharge status: Home / Self Care | Payer: Medicare HMO | Attending: Emergency Medicine | Admitting: Emergency Medicine

## 2020-10-26 ENCOUNTER — Other Ambulatory Visit: Payer: Self-pay

## 2020-10-26 ENCOUNTER — Emergency Department (HOSPITAL_COMMUNITY): Payer: Medicare HMO

## 2020-10-26 DIAGNOSIS — G4733 Obstructive sleep apnea (adult) (pediatric): Secondary | ICD-10-CM | POA: Insufficient documentation

## 2020-10-26 DIAGNOSIS — I480 Paroxysmal atrial fibrillation: Secondary | ICD-10-CM | POA: Insufficient documentation

## 2020-10-26 DIAGNOSIS — Z794 Long term (current) use of insulin: Secondary | ICD-10-CM | POA: Insufficient documentation

## 2020-10-26 DIAGNOSIS — R4689 Other symptoms and signs involving appearance and behavior: Secondary | ICD-10-CM | POA: Diagnosis present

## 2020-10-26 DIAGNOSIS — Z992 Dependence on renal dialysis: Secondary | ICD-10-CM | POA: Diagnosis not present

## 2020-10-26 DIAGNOSIS — I2510 Atherosclerotic heart disease of native coronary artery without angina pectoris: Secondary | ICD-10-CM | POA: Diagnosis not present

## 2020-10-26 DIAGNOSIS — Z951 Presence of aortocoronary bypass graft: Secondary | ICD-10-CM

## 2020-10-26 DIAGNOSIS — Z955 Presence of coronary angioplasty implant and graft: Secondary | ICD-10-CM | POA: Insufficient documentation

## 2020-10-26 DIAGNOSIS — I469 Cardiac arrest, cause unspecified: Secondary | ICD-10-CM | POA: Diagnosis not present

## 2020-10-26 DIAGNOSIS — Z7901 Long term (current) use of anticoagulants: Secondary | ICD-10-CM | POA: Diagnosis not present

## 2020-10-26 DIAGNOSIS — I120 Hypertensive chronic kidney disease with stage 5 chronic kidney disease or end stage renal disease: Secondary | ICD-10-CM | POA: Diagnosis present

## 2020-10-26 DIAGNOSIS — E1122 Type 2 diabetes mellitus with diabetic chronic kidney disease: Secondary | ICD-10-CM | POA: Insufficient documentation

## 2020-10-26 DIAGNOSIS — S0001XA Abrasion of scalp, initial encounter: Secondary | ICD-10-CM | POA: Insufficient documentation

## 2020-10-26 DIAGNOSIS — X838XXA Intentional self-harm by other specified means, initial encounter: Secondary | ICD-10-CM | POA: Insufficient documentation

## 2020-10-26 DIAGNOSIS — Z86718 Personal history of other venous thrombosis and embolism: Secondary | ICD-10-CM | POA: Insufficient documentation

## 2020-10-26 DIAGNOSIS — Z7409 Other reduced mobility: Secondary | ICD-10-CM | POA: Diagnosis present

## 2020-10-26 DIAGNOSIS — J9621 Acute and chronic respiratory failure with hypoxia: Secondary | ICD-10-CM | POA: Diagnosis not present

## 2020-10-26 DIAGNOSIS — H548 Legal blindness, as defined in USA: Secondary | ICD-10-CM | POA: Diagnosis present

## 2020-10-26 DIAGNOSIS — J9612 Chronic respiratory failure with hypercapnia: Secondary | ICD-10-CM | POA: Diagnosis not present

## 2020-10-26 DIAGNOSIS — N186 End stage renal disease: Secondary | ICD-10-CM | POA: Insufficient documentation

## 2020-10-26 DIAGNOSIS — N185 Chronic kidney disease, stage 5: Secondary | ICD-10-CM | POA: Diagnosis present

## 2020-10-26 DIAGNOSIS — R45850 Homicidal ideations: Secondary | ICD-10-CM | POA: Insufficient documentation

## 2020-10-26 DIAGNOSIS — F329 Major depressive disorder, single episode, unspecified: Secondary | ICD-10-CM | POA: Diagnosis not present

## 2020-10-26 DIAGNOSIS — Z95810 Presence of automatic (implantable) cardiac defibrillator: Secondary | ICD-10-CM | POA: Diagnosis not present

## 2020-10-26 DIAGNOSIS — I132 Hypertensive heart and chronic kidney disease with heart failure and with stage 5 chronic kidney disease, or end stage renal disease: Secondary | ICD-10-CM | POA: Insufficient documentation

## 2020-10-26 DIAGNOSIS — R2689 Other abnormalities of gait and mobility: Secondary | ICD-10-CM | POA: Insufficient documentation

## 2020-10-26 DIAGNOSIS — E1151 Type 2 diabetes mellitus with diabetic peripheral angiopathy without gangrene: Secondary | ICD-10-CM | POA: Insufficient documentation

## 2020-10-26 DIAGNOSIS — R079 Chest pain, unspecified: Secondary | ICD-10-CM

## 2020-10-26 DIAGNOSIS — R45851 Suicidal ideations: Secondary | ICD-10-CM | POA: Insufficient documentation

## 2020-10-26 DIAGNOSIS — Y900 Blood alcohol level of less than 20 mg/100 ml: Secondary | ICD-10-CM | POA: Diagnosis not present

## 2020-10-26 DIAGNOSIS — Z8659 Personal history of other mental and behavioral disorders: Secondary | ICD-10-CM

## 2020-10-26 DIAGNOSIS — J9622 Acute and chronic respiratory failure with hypercapnia: Secondary | ICD-10-CM | POA: Diagnosis not present

## 2020-10-26 DIAGNOSIS — Z20822 Contact with and (suspected) exposure to covid-19: Secondary | ICD-10-CM | POA: Insufficient documentation

## 2020-10-26 DIAGNOSIS — I5032 Chronic diastolic (congestive) heart failure: Secondary | ICD-10-CM | POA: Diagnosis not present

## 2020-10-26 DIAGNOSIS — Z8669 Personal history of other diseases of the nervous system and sense organs: Secondary | ICD-10-CM

## 2020-10-26 DIAGNOSIS — Z79899 Other long term (current) drug therapy: Secondary | ICD-10-CM | POA: Diagnosis not present

## 2020-10-26 DIAGNOSIS — N2581 Secondary hyperparathyroidism of renal origin: Secondary | ICD-10-CM | POA: Diagnosis not present

## 2020-10-26 DIAGNOSIS — I10 Essential (primary) hypertension: Secondary | ICD-10-CM | POA: Diagnosis present

## 2020-10-26 DIAGNOSIS — S0990XA Unspecified injury of head, initial encounter: Secondary | ICD-10-CM | POA: Diagnosis present

## 2020-10-26 LAB — COMPREHENSIVE METABOLIC PANEL
ALT: 17 U/L (ref 0–44)
AST: 42 U/L — ABNORMAL HIGH (ref 15–41)
Albumin: 3.3 g/dL — ABNORMAL LOW (ref 3.5–5.0)
Alkaline Phosphatase: 149 U/L — ABNORMAL HIGH (ref 38–126)
Anion gap: 16 — ABNORMAL HIGH (ref 5–15)
BUN: 37 mg/dL — ABNORMAL HIGH (ref 8–23)
CO2: 20 mmol/L — ABNORMAL LOW (ref 22–32)
Calcium: 8.7 mg/dL — ABNORMAL LOW (ref 8.9–10.3)
Chloride: 106 mmol/L (ref 98–111)
Creatinine, Ser: 6.14 mg/dL — ABNORMAL HIGH (ref 0.61–1.24)
GFR, Estimated: 10 mL/min — ABNORMAL LOW (ref >60)
Glucose, Bld: 381 mg/dL — ABNORMAL HIGH (ref 70–99)
Potassium: 4.4 mmol/L (ref 3.5–5.1)
Sodium: 142 mmol/L (ref 135–145)
Total Bilirubin: 1 mg/dL (ref 0.3–1.2)
Total Protein: 7.1 g/dL (ref 6.5–8.1)

## 2020-10-26 LAB — CBC WITH DIFFERENTIAL/PLATELET
Abs Immature Granulocytes: 0.03 10*3/uL (ref 0.00–0.07)
Basophils Absolute: 0.1 10*3/uL (ref 0.0–0.1)
Basophils Relative: 1 %
Eosinophils Absolute: 0.2 10*3/uL (ref 0.0–0.5)
Eosinophils Relative: 2 %
HCT: 34.6 % — ABNORMAL LOW (ref 39.0–52.0)
Hemoglobin: 10.5 g/dL — ABNORMAL LOW (ref 13.0–17.0)
Immature Granulocytes: 0 %
Lymphocytes Relative: 10 %
Lymphs Abs: 0.9 10*3/uL (ref 0.7–4.0)
MCH: 29.8 pg (ref 26.0–34.0)
MCHC: 30.3 g/dL (ref 30.0–36.0)
MCV: 98.3 fL (ref 80.0–100.0)
Monocytes Absolute: 0.9 10*3/uL (ref 0.1–1.0)
Monocytes Relative: 10 %
Neutro Abs: 7.2 10*3/uL (ref 1.7–7.7)
Neutrophils Relative %: 77 %
Platelets: 134 10*3/uL — ABNORMAL LOW (ref 150–400)
RBC: 3.52 MIL/uL — ABNORMAL LOW (ref 4.22–5.81)
RDW: 19.8 % — ABNORMAL HIGH (ref 11.5–15.5)
WBC: 9.2 10*3/uL (ref 4.0–10.5)
nRBC: 0 % (ref 0.0–0.2)

## 2020-10-26 LAB — RESP PANEL BY RT-PCR (FLU A&B, COVID) ARPGX2
Influenza A by PCR: NEGATIVE
Influenza B by PCR: NEGATIVE
SARS Coronavirus 2 by RT PCR: NEGATIVE

## 2020-10-26 LAB — ETHANOL: Alcohol, Ethyl (B): <10 mg/dL (ref <10)

## 2020-10-26 IMAGING — CR DG CHEST 1V PORT
1 series · 1 of 1 positions shown · non-contrast
Comparison: [DATE]

CLINICAL DATA: Diabetes.

EXAM:
PORTABLE CHEST 1 VIEW

[x chest ap]
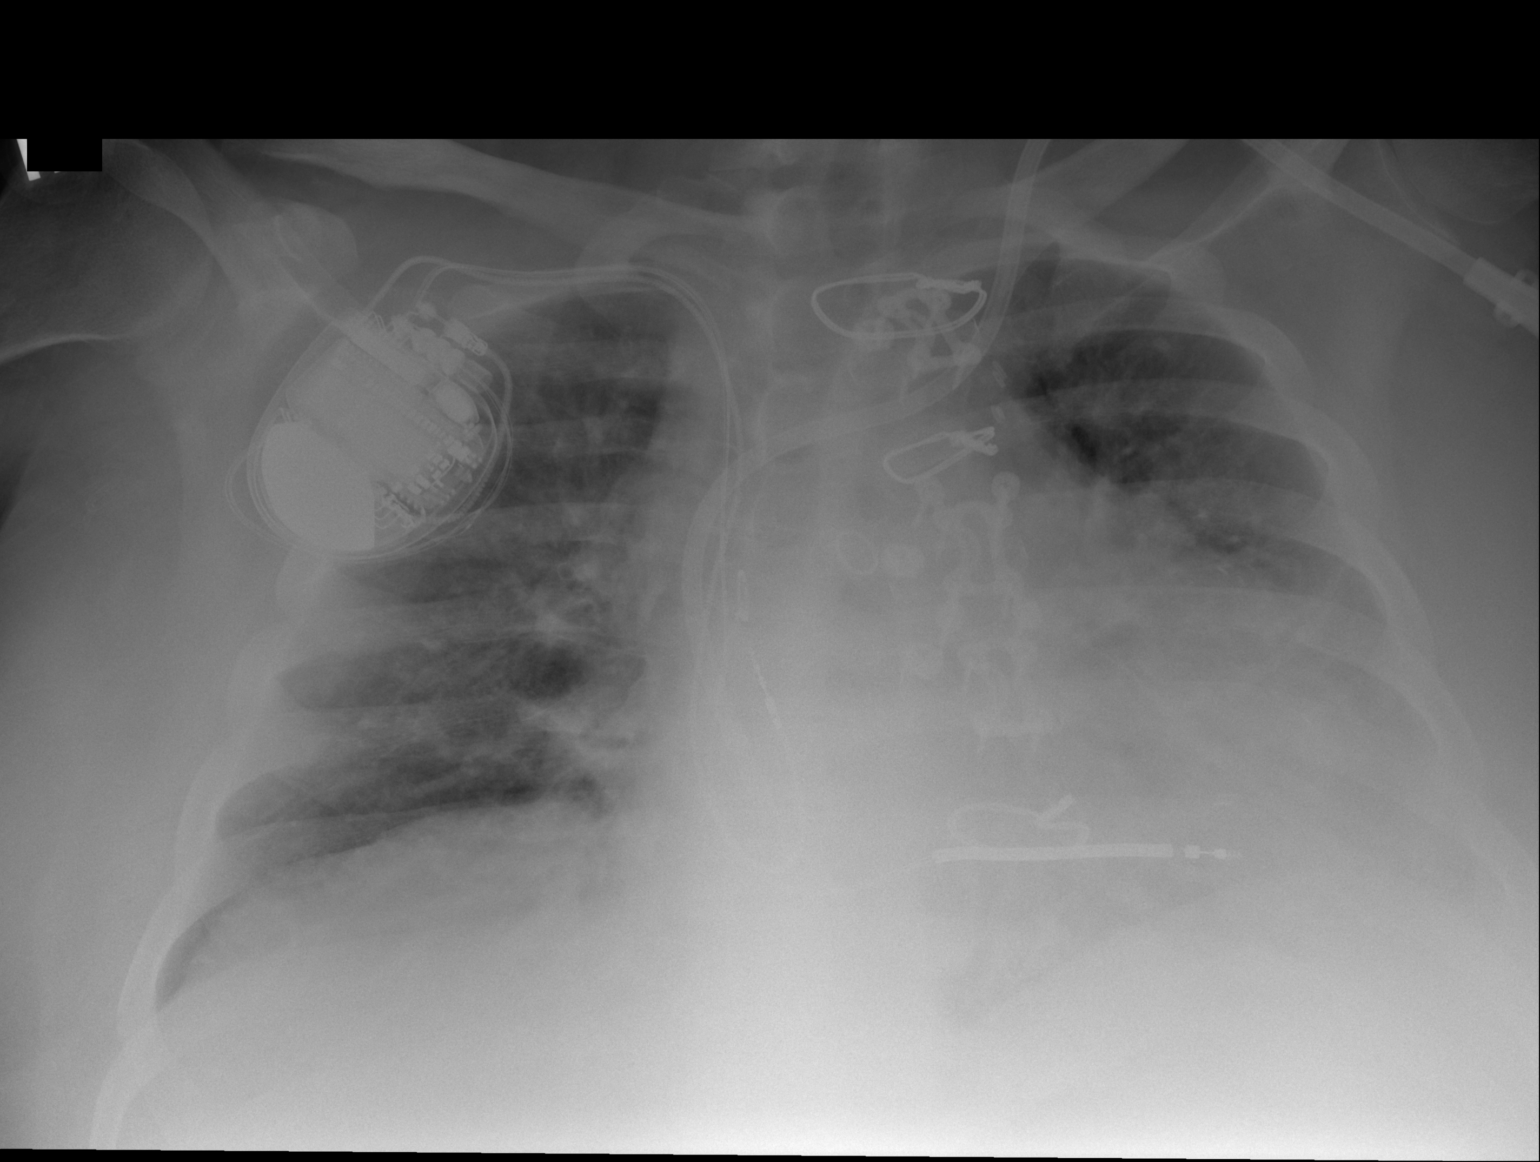

[1 of 1 positions shown; findings below may reference images not displayed]

FINDINGS: [KE] hours. Low lung volumes. The cardio pericardial silhouette is
enlarged. Status post CABG. Left-sided IJ dialysis catheter tip
overlies the right atrium. Right-sided permanent pacemaker/AICD
again noted. There is pulmonary vascular congestion without overt
pulmonary edema. No substantial pleural effusion.
IMPRESSION: Low volume film with cardiomegaly and vascular congestion.

## 2020-10-26 MED ORDER — OLANZAPINE 10 MG IM SOLR
10.0000 mg | Freq: Once | INTRAMUSCULAR | Status: AC
  Administered 2020-10-26: 10 mg via INTRAMUSCULAR
  Filled 2020-10-26: qty 10

## 2020-10-26 MED ORDER — STERILE WATER FOR INJECTION IJ SOLN
INTRAMUSCULAR | Status: AC
  Filled 2020-10-26: qty 10

## 2020-10-26 NOTE — ED Notes (Signed)
Patient keeps requesting a phone call as well as pain medication
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
## 2020-10-26 NOTE — ED Notes (Signed)
Patient stated that he does not urinate frequently due to dialysis  No urine collected at this time
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
## 2020-10-26 NOTE — ED Notes (Addendum)
Patient keeps stating he wants to sit on side of bed. Repositioned patient so he is sitting upright as compromise. Patient stated we are trying to kill him

## 2020-10-26 NOTE — ED Provider Notes (Addendum)
Care assumed from Dr. Roderic Palau, patient under IVC pending TTS evaluation.  TTS consultation is appreciated.  Patient meets inpatient criteria, they are currently working on appropriate placement.   Delora Fuel, MD [id number]  Discussed with Dr. Moshe Cipro, on-call for nephrology.  She will arrange for dialysis today.    Delora Fuel, MD [id number] [phone]

## 2020-10-26 NOTE — ED Notes (Signed)
Patient transported to X-ray 

## 2020-10-26 NOTE — ED Notes (Signed)
Patient stated that he wants to die. He wants out of the hospital and wants to die. Stated that we are holding him here against his wishes and will be on the hospitals head when he kills himself.
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
## 2020-10-26 NOTE — ED Notes (Signed)
Patient is continually requesting to leave the hospital and just go home to die. Stated the more he is in the hospital the more he is holding [name] accountable for him wanting to kill himself

## 2020-10-26 NOTE — ED Notes (Signed)
Safety sitter bedside.

## 2020-10-26 NOTE — ED Notes (Signed)
Sitter documentation: Bed alarm on Patient asking to leave Cleaned and bandaged Right arm, superior top of head, anterior forehead

## 2020-10-26 NOTE — ED Notes (Signed)
Family at bedside. 
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
## 2020-10-26 NOTE — ED Notes (Signed)
RN notified of BP  
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
## 2020-10-26 NOTE — ED Notes (Signed)
Joya Gaskins, EDP made aware patient HR is sustaining in the 120s and patient is supposed to be on a beta blocker but has not been taking it.  No new orders received.

## 2020-10-27 DIAGNOSIS — I120 Hypertensive chronic kidney disease with stage 5 chronic kidney disease or end stage renal disease: Secondary | ICD-10-CM | POA: Diagnosis present

## 2020-10-27 DIAGNOSIS — Z7409 Other reduced mobility: Secondary | ICD-10-CM | POA: Diagnosis present

## 2020-10-27 DIAGNOSIS — Z992 Dependence on renal dialysis: Secondary | ICD-10-CM | POA: Diagnosis not present

## 2020-10-27 DIAGNOSIS — R45850 Homicidal ideations: Secondary | ICD-10-CM | POA: Diagnosis not present

## 2020-10-27 DIAGNOSIS — J9612 Chronic respiratory failure with hypercapnia: Secondary | ICD-10-CM | POA: Diagnosis not present

## 2020-10-27 DIAGNOSIS — I5032 Chronic diastolic (congestive) heart failure: Secondary | ICD-10-CM | POA: Diagnosis not present

## 2020-10-27 DIAGNOSIS — F329 Major depressive disorder, single episode, unspecified: Secondary | ICD-10-CM | POA: Diagnosis not present

## 2020-10-27 DIAGNOSIS — N185 Chronic kidney disease, stage 5: Secondary | ICD-10-CM | POA: Diagnosis present

## 2020-10-27 DIAGNOSIS — J9621 Acute and chronic respiratory failure with hypoxia: Secondary | ICD-10-CM | POA: Diagnosis not present

## 2020-10-27 DIAGNOSIS — I2510 Atherosclerotic heart disease of native coronary artery without angina pectoris: Secondary | ICD-10-CM | POA: Diagnosis not present

## 2020-10-27 DIAGNOSIS — Z86718 Personal history of other venous thrombosis and embolism: Secondary | ICD-10-CM | POA: Diagnosis not present

## 2020-10-27 DIAGNOSIS — G4733 Obstructive sleep apnea (adult) (pediatric): Secondary | ICD-10-CM | POA: Diagnosis not present

## 2020-10-27 DIAGNOSIS — N186 End stage renal disease: Secondary | ICD-10-CM

## 2020-10-27 DIAGNOSIS — E1122 Type 2 diabetes mellitus with diabetic chronic kidney disease: Secondary | ICD-10-CM | POA: Diagnosis present

## 2020-10-27 DIAGNOSIS — I10 Essential (primary) hypertension: Secondary | ICD-10-CM | POA: Diagnosis present

## 2020-10-27 DIAGNOSIS — Z20822 Contact with and (suspected) exposure to covid-19: Secondary | ICD-10-CM | POA: Diagnosis not present

## 2020-10-27 DIAGNOSIS — S0001XA Abrasion of scalp, initial encounter: Secondary | ICD-10-CM | POA: Diagnosis not present

## 2020-10-27 DIAGNOSIS — I469 Cardiac arrest, cause unspecified: Secondary | ICD-10-CM | POA: Diagnosis not present

## 2020-10-27 DIAGNOSIS — J9622 Acute and chronic respiratory failure with hypercapnia: Secondary | ICD-10-CM | POA: Diagnosis not present

## 2020-10-27 DIAGNOSIS — Z951 Presence of aortocoronary bypass graft: Secondary | ICD-10-CM

## 2020-10-27 DIAGNOSIS — Z95810 Presence of automatic (implantable) cardiac defibrillator: Secondary | ICD-10-CM | POA: Diagnosis present

## 2020-10-27 DIAGNOSIS — S0990XA Unspecified injury of head, initial encounter: Secondary | ICD-10-CM | POA: Diagnosis present

## 2020-10-27 DIAGNOSIS — N2581 Secondary hyperparathyroidism of renal origin: Secondary | ICD-10-CM | POA: Diagnosis not present

## 2020-10-27 DIAGNOSIS — Z8669 Personal history of other diseases of the nervous system and sense organs: Secondary | ICD-10-CM

## 2020-10-27 DIAGNOSIS — H548 Legal blindness, as defined in USA: Secondary | ICD-10-CM | POA: Diagnosis present

## 2020-10-27 DIAGNOSIS — I480 Paroxysmal atrial fibrillation: Secondary | ICD-10-CM | POA: Diagnosis not present

## 2020-10-27 DIAGNOSIS — R2689 Other abnormalities of gait and mobility: Secondary | ICD-10-CM | POA: Diagnosis not present

## 2020-10-27 DIAGNOSIS — X838XXA Intentional self-harm by other specified means, initial encounter: Secondary | ICD-10-CM | POA: Diagnosis not present

## 2020-10-27 DIAGNOSIS — I132 Hypertensive heart and chronic kidney disease with heart failure and with stage 5 chronic kidney disease, or end stage renal disease: Secondary | ICD-10-CM | POA: Diagnosis not present

## 2020-10-27 DIAGNOSIS — Z8659 Personal history of other mental and behavioral disorders: Secondary | ICD-10-CM

## 2020-10-27 DIAGNOSIS — Z955 Presence of coronary angioplasty implant and graft: Secondary | ICD-10-CM | POA: Diagnosis not present

## 2020-10-27 DIAGNOSIS — Y900 Blood alcohol level of less than 20 mg/100 ml: Secondary | ICD-10-CM | POA: Diagnosis not present

## 2020-10-27 DIAGNOSIS — E1151 Type 2 diabetes mellitus with diabetic peripheral angiopathy without gangrene: Secondary | ICD-10-CM | POA: Diagnosis not present

## 2020-10-27 LAB — CBG MONITORING, ED
Glucose-Capillary: 262 mg/dL — ABNORMAL HIGH (ref 70–99)
Glucose-Capillary: 213 mg/dL — ABNORMAL HIGH (ref 70–99)
Glucose-Capillary: 166 mg/dL — ABNORMAL HIGH (ref 70–99)

## 2020-10-27 LAB — HEPATITIS B SURFACE ANTIGEN: Hepatitis B Surface Ag: NONREACTIVE

## 2020-10-27 LAB — HEPATITIS B SURFACE ANTIBODY,QUALITATIVE: Hep B S Ab: NONREACTIVE

## 2020-10-27 LAB — HEPATITIS B CORE ANTIBODY, TOTAL: Hep B Core Total Ab: NONREACTIVE

## 2020-10-27 MED ORDER — HYDROXYZINE HCL 25 MG PO TABS
25.0000 mg | ORAL_TABLET | Freq: Two times a day (BID) | ORAL | Status: DC
  Administered 2020-10-27 – 2020-11-01 (×13): 25 mg via ORAL
  Filled 2020-10-27 (×13): qty 1

## 2020-10-27 MED ORDER — HEPARIN SODIUM (PORCINE) 1000 UNIT/ML DIALYSIS: 2000.0000 [IU] | INTRAMUSCULAR | Status: DC | PRN

## 2020-10-27 MED ORDER — ATORVASTATIN CALCIUM 40 MG PO TABS
40.0000 mg | ORAL_TABLET | Freq: Every day | ORAL | Status: DC
  Administered 2020-10-27 – 2020-11-01 (×6): 40 mg via ORAL
  Filled 2020-10-27 (×6): qty 1

## 2020-10-27 MED ORDER — ERGOCALCIFEROL 1.25 MG (50000 UT) PO CAPS: 50000.0000 [IU] | ORAL_CAPSULE | ORAL | Status: DC

## 2020-10-27 MED ORDER — LORAZEPAM 2 MG/ML IJ SOLN
1.0000 mg | Freq: Once | INTRAMUSCULAR | Status: DC
  Filled 2020-10-27 (×3): qty 1

## 2020-10-27 MED ORDER — HEPARIN SODIUM (PORCINE) 1000 UNIT/ML IJ SOLN
INTRAMUSCULAR | Status: AC
  Filled 2020-10-27: qty 4

## 2020-10-27 MED ORDER — INSULIN LISPRO (1 UNIT DIAL) 100 UNIT/ML (KWIKPEN): 2.0000 [IU] | PEN_INJECTOR | Freq: Three times a day (TID) | SUBCUTANEOUS | Status: DC

## 2020-10-27 MED ORDER — CLOPIDOGREL BISULFATE 75 MG PO TABS
75.0000 mg | ORAL_TABLET | Freq: Every day | ORAL | Status: DC
  Administered 2020-10-27 – 2020-11-01 (×6): 75 mg via ORAL
  Filled 2020-10-27 (×6): qty 1

## 2020-10-27 MED ORDER — PANTOPRAZOLE SODIUM 40 MG PO TBEC
40.0000 mg | DELAYED_RELEASE_TABLET | Freq: Every day | ORAL | Status: DC
  Administered 2020-10-27 – 2020-11-01 (×6): 40 mg via ORAL
  Filled 2020-10-27 (×6): qty 1

## 2020-10-27 MED ORDER — LATANOPROST 0.005 % OP SOLN
1.0000 [drp] | Freq: Every day | OPHTHALMIC | Status: DC
  Administered 2020-10-28 – 2020-11-01 (×5): 1 [drp] via OPHTHALMIC
  Filled 2020-10-27 (×3): qty 2.5

## 2020-10-27 MED ORDER — ISOSORBIDE MONONITRATE ER 30 MG PO TB24
30.0000 mg | ORAL_TABLET | Freq: Every day | ORAL | Status: DC
  Administered 2020-10-27 – 2020-11-01 (×6): 30 mg via ORAL
  Filled 2020-10-27 (×6): qty 1

## 2020-10-27 MED ORDER — METOPROLOL TARTRATE 25 MG PO TABS
50.0000 mg | ORAL_TABLET | Freq: Every day | ORAL | Status: DC
  Administered 2020-10-27 – 2020-10-31 (×5): 50 mg via ORAL
  Filled 2020-10-27 (×5): qty 2

## 2020-10-27 MED ORDER — ALUM & MAG HYDROXIDE-SIMETH 200-200-20 MG/5ML PO SUSP
30.0000 mL | Freq: Four times a day (QID) | ORAL | Status: DC | PRN
  Administered 2020-10-31: 30 mL via ORAL
  Filled 2020-10-27: qty 30

## 2020-10-27 MED ORDER — APIXABAN 2.5 MG PO TABS
2.5000 mg | ORAL_TABLET | Freq: Two times a day (BID) | ORAL | Status: DC
  Administered 2020-10-27 – 2020-11-01 (×13): 2.5 mg via ORAL
  Filled 2020-10-27 (×14): qty 1

## 2020-10-27 MED ORDER — ACETAMINOPHEN 325 MG PO TABS
650.0000 mg | ORAL_TABLET | ORAL | Status: DC | PRN
  Administered 2020-10-29 – 2020-10-31 (×3): 650 mg via ORAL
  Filled 2020-10-27 (×3): qty 2

## 2020-10-27 MED ORDER — NITROGLYCERIN 0.4 MG SL SUBL: 0.4000 mg | SUBLINGUAL_TABLET | SUBLINGUAL | Status: DC | PRN

## 2020-10-27 MED ORDER — INSULIN ASPART 100 UNIT/ML ~~LOC~~ SOLN
0.0000 [IU] | Freq: Three times a day (TID) | SUBCUTANEOUS | Status: DC
  Administered 2020-10-28: 1 [IU] via SUBCUTANEOUS
  Administered 2020-10-28: 2 [IU] via SUBCUTANEOUS
  Administered 2020-10-29: 1 [IU] via SUBCUTANEOUS
  Administered 2020-10-30: 2 [IU] via SUBCUTANEOUS
  Administered 2020-10-30 – 2020-11-01 (×4): 1 [IU] via SUBCUTANEOUS
  Administered 2020-11-01: 2 [IU] via SUBCUTANEOUS
  Filled 2020-10-27: qty 0.06

## 2020-10-27 MED ORDER — VITAMIN D (ERGOCALCIFEROL) 1.25 MG (50000 UNIT) PO CAPS
50000.0000 [IU] | ORAL_CAPSULE | ORAL | Status: DC
  Administered 2020-10-27: 50000 [IU] via ORAL
  Filled 2020-10-27: qty 1

## 2020-10-27 MED ORDER — ONDANSETRON HCL 4 MG PO TABS: 4.0000 mg | ORAL_TABLET | Freq: Three times a day (TID) | ORAL | Status: DC | PRN

## 2020-10-27 MED ORDER — SODIUM BICARBONATE 650 MG PO TABS
325.0000 mg | ORAL_TABLET | Freq: Two times a day (BID) | ORAL | Status: DC
  Administered 2020-10-27 – 2020-11-01 (×13): 325 mg via ORAL
  Filled 2020-10-27 (×14): qty 0.5

## 2020-10-27 MED ORDER — AMLODIPINE BESYLATE 5 MG PO TABS
2.5000 mg | ORAL_TABLET | Freq: Every day | ORAL | Status: DC
  Administered 2020-10-27 – 2020-11-01 (×6): 2.5 mg via ORAL
  Filled 2020-10-27 (×5): qty 1

## 2020-10-27 MED ORDER — INSULIN ASPART 100 UNIT/ML ~~LOC~~ SOLN
2.0000 [IU] | Freq: Three times a day (TID) | SUBCUTANEOUS | Status: DC
  Administered 2020-10-27 – 2020-11-01 (×12): 2 [IU] via SUBCUTANEOUS
  Filled 2020-10-27: qty 0.02

## 2020-10-27 MED ORDER — INSULIN GLARGINE 100 UNIT/ML ~~LOC~~ SOLN
29.0000 [IU] | Freq: Every day | SUBCUTANEOUS | Status: DC
  Administered 2020-10-27 – 2020-11-01 (×7): 29 [IU] via SUBCUTANEOUS
  Filled 2020-10-27 (×7): qty 0.29

## 2020-10-27 MED ORDER — LEVOTHYROXINE SODIUM 25 MCG PO TABS
12.5000 ug | ORAL_TABLET | Freq: Every day | ORAL | Status: DC
  Administered 2020-10-28 – 2020-11-01 (×4): 12.5 ug via ORAL
  Filled 2020-10-27 (×5): qty 1

## 2020-10-27 MED ORDER — METOPROLOL TARTRATE 25 MG PO TABS
25.0000 mg | ORAL_TABLET | Freq: Once | ORAL | Status: AC
  Administered 2020-10-27: 25 mg via ORAL
  Filled 2020-10-27: qty 1

## 2020-10-27 MED ORDER — GABAPENTIN 100 MG PO CAPS
100.0000 mg | ORAL_CAPSULE | Freq: Two times a day (BID) | ORAL | Status: DC
  Administered 2020-10-27 – 2020-11-01 (×13): 100 mg via ORAL
  Filled 2020-10-27 (×13): qty 1

## 2020-10-27 MED ORDER — AMIODARONE HCL 200 MG PO TABS
200.0000 mg | ORAL_TABLET | Freq: Every day | ORAL | Status: DC
  Administered 2020-10-27 – 2020-11-01 (×6): 200 mg via ORAL
  Filled 2020-10-27 (×6): qty 1

## 2020-10-27 MED ORDER — CHLORHEXIDINE GLUCONATE CLOTH 2 % EX PADS
6.0000 | MEDICATED_PAD | Freq: Every day | CUTANEOUS | Status: DC
  Administered 2020-10-28 – 2020-10-30 (×2): 6 via TOPICAL

## 2020-10-27 MED ORDER — CALCIUM ACETATE (PHOS BINDER) 667 MG PO CAPS
1334.0000 mg | ORAL_CAPSULE | Freq: Three times a day (TID) | ORAL | Status: DC
  Administered 2020-10-27 – 2020-11-01 (×13): 1334 mg via ORAL
  Filled 2020-10-27 (×21): qty 2

## 2020-10-27 NOTE — ED Notes (Signed)
Patient notified about morning medication. Pt refuses to take medication and states" hey look, they have my phone locked up here somewhere and I need to make a phone call. Go get it for me". This RN responded with " items have been put away for your safety and per protocol". Pt responded with " [place] don[place] have [place] freedom [place] this [place] I didn't do anything wrong. You trying to trick me to say things. If I don't get my things I'm going to walk out. Nobody, You, can't tell me what to do with my life". This RN reniform the patient information on how the waiting  process is completed. Pt continued to further respond with " I haven't talked to anybody. Only one girl on a tablet with probably 2 weeks of schooling. She doesn't know what she's talking about. Donnald Garre been through a lot in the past 4 years. Its my life". Patient very agitated at this time. Sitter at bedside.

## 2020-10-27 NOTE — Progress Notes (Addendum)
Inpatient Diabetes Program Recommendations  AACE/ADA: New Consensus Statement on Inpatient Glycemic Control (2015)  Target Ranges:  Prepandial:   less than 140 mg/dL      Peak postprandial:   less than 180 mg/dL (1-2 hours)      Critically ill patients:  140 - 180 mg/dL   Lab Results  Component Value Date   GLUCAP 262 (H) 10/27/2020    Review of Glycemic Control Results for MALAK, TREMEL (MRN [id number]) as of 10/27/2020 09:18  Ref. Range 10/27/2020 01:57  Glucose-Capillary Latest Ref Range: 70 - 99 mg/dL 262 (H)   Diabetes history: DM 2  Outpatient Diabetes medications:  Basaglar 40 units q HS Humalog 2-15 units tid with meals Current orders for Inpatient glycemic control:  Lantus 29 units q HS Novolog 2 units tid with meals Inpatient Diabetes Program Recommendations:   Please add Novolog sensitive tid with meals and HS and check CBG's tid with meals and HS.  Thanks,  Adah Perl, RN, BC-ADM Inpatient Diabetes Coordinator Pager [phone] (8a-5p)

## 2020-10-27 NOTE — ED Notes (Signed)
Report

## 2020-10-27 NOTE — ED Notes (Signed)
Report called from [place] from dialysis. RN states patient had been hypotensive throughout treatment. 1.8L removed

## 2020-10-27 NOTE — ED Notes (Addendum)
Attempted to give patient medication for his HR, patient refused stating that the doctor was an idiot and that we are just trying to kill him. I encouraged patient to take the help being offered but was unable to redirect him. Patient continues to refuse metoprolol at this time.

## 2020-10-27 NOTE — ED Notes (Signed)
Received patient from [facility] from dialysis. Patient alert and oriented BP 98/48 P 116 R 18  O2 98% RA

## 2020-10-27 NOTE — ED Notes (Addendum)
Pt reports he undergoes dialysis M/W/F.  Pt reports he did have dialysis this past Monday.

## 2020-10-27 NOTE — ED Notes (Signed)
Pt assisted to reclining chair, per his request.  Pt insistent on doing it himself, able to stand and pivot to chair. No assistance required. Sitter remains at bedside.

## 2020-10-27 NOTE — Consult Note (Addendum)
Renal Service Consult Note [facility]  Linden Peppler 10/27/2020 Sol Blazing, MD Requesting Physician: Dr. Roxanne Mins  Reason for Consult: ESRD pt w/ IVC, suicidal ideation HPI: The patient is a 65 y.o. year-old w/ hx of PAF, ESRD on HD, DM2 w/ PAD, hx R TMA, hx DVT, HTN, hx cardiopulm arrest, CAD/ hx MI/ sp CABG 2017, chronic diast CHF, impaired mobility, chron resp failure w/ ^pCO2, dual chamber ICD in place, depression, legally blind presented to ED brought in IVC by the [facility] police dept. Pt was saying he wanted to die, was agitated and aggressive towards the police and towards his wife. Seen by psychiatry team, meets inpatient criteria, geropsychiatry recommended. We are asked to see for dialysis.   Pt seen in ED.  Reports MWF HD in [facility] w[facility]. Last HD Monday, doesn't miss HD sessions.  Had LUA fistula which is not working now, has tunneled HD cath x 2-3 month.  Started HD 4.5 yrs ago.  Pt lives w/ his wife in an apt in [place].    Pt denies any SOB, cough or orthopnea. No abd pain. + 45 lb wt gain over the last several mos to a year.     ROS  denies CP  no joint pain   no HA  no blurry vision  no rash  no diarrhea  no nausea/ vomiting    Past Medical History No past medical history on file. Past Surgical History No past surgical history on file. Family History No family history on file. Social History  has no history on file for tobacco use, alcohol use, and drug use. Allergies  Allergies  Allergen Reactions  . Morphine Other (See Comments)    Per son, [name], pt becomes unresponsive/disoriented. Especially when given after HD tx  . Liraglutide Diarrhea    Phenol Phenol    Home medications Prior to Admission medications   Medication Sig Start Date End Date Taking? Authorizing Provider  amiodarone (PACERONE) 200 MG tablet Take 200 mg by mouth daily. 10/13/20  Yes [provider]  amLODipine (NORVASC) 2.5 MG tablet Take  2.5 mg by mouth daily. 10/13/20  Yes [provider]  atorvastatin (LIPITOR) 40 MG tablet Take 40 mg by mouth daily. 09/23/20  Yes [provider]  calcium acetate (PHOSLO) 667 MG capsule Take 1,334 mg by mouth 3 (three) times daily with meals. 08/23/20  Yes [provider]  clopidogrel (PLAVIX) 75 MG tablet Take 75 mg by mouth daily. 10/13/20  Yes [provider]  ELIQUIS 2.5 MG TABS tablet Take 2.5 mg by mouth 2 (two) times daily. 10/13/20  Yes [provider]  ergocalciferol (VITAMIN D2) 1.25 MG (50000 UT) capsule Take 50,000 Units by mouth every 30 (thirty) days.   Yes [provider]  gabapentin (NEURONTIN) 100 MG capsule Take 100 mg by mouth in the morning and at bedtime. 08/23/20  Yes [provider]  HYDROcodone-acetaminophen (NORCO/VICODIN) 5-325 MG tablet Take 1 tablet by mouth every 6 (six) hours as needed for moderate pain. 01/17/20  Yes Fredia Sorrow, MD  hydrOXYzine (ATARAX/VISTARIL) 25 MG tablet Take 25 mg by mouth 2 (two) times daily. 10/13/20  Yes [provider]  Insulin Glargine (BASAGLAR KWIKPEN) 100 UNIT/ML Inject 40 Units into the skin at bedtime. 08/23/20  Yes [provider]  insulin lispro (HUMALOG) 100 UNIT/ML KwikPen Inject into the skin. Inject two Units to twelve Units into the skin 15 (fifteen) minutes before meals for 30 days. 180-200 =  2 units 201-250 = 5 units 251-300 = 7 units 301-400 = 12 units and call MD 08/23/20  Yes [provider]  isosorbide mononitrate (IMDUR) 30 MG 24 hr tablet Take 30 mg by mouth daily. 10/13/20  Yes [provider]  latanoprost (XALATAN) 0.005 % ophthalmic solution Place 1 drop into both eyes at bedtime. 08/23/20  Yes [provider]  levothyroxine (SYNTHROID) 25 MCG tablet Take 12.5 mcg by mouth daily before breakfast. 10/13/20  Yes [provider]  metoprolol tartrate (LOPRESSOR) 50 MG tablet Take 25 mg by mouth 2 (two) times daily. 10/13/20   Yes [provider]  nitroGLYCERIN (NITROSTAT) 0.3 MG SL tablet Place 0.3 mg under the tongue every 5 (five) minutes as needed for chest pain. 08/23/20  Yes [provider]  pantoprazole (PROTONIX) 40 MG tablet Take 1 tablet by mouth daily. 10/13/20  Yes [provider]  sodium bicarbonate 650 MG tablet Take 325 mg by mouth 2 (two) times daily. 08/23/20  Yes [provider]     Vitals:   10/27/20 0406 10/27/20 [id number] 10/27/20 0838 10/27/20 0846  BP: 123/66 115/87 115/87 115/87  Pulse: (!) 110 (!) 120  (!) 120  Resp: 19 16    Temp:      TempSrc:      SpO2:  97%    Weight:      Height:       Exam Gen alert, no distress No rash, cyanosis or gangrene Sclera anicteric, throat clear  No jvd or bruits Chest clear bilat to bases, no rales/ wheezing RRR no MRG Abd soft ntnd no mass or ascites +bs GU normal MS no joint effusions or deformity Ext no LE or UE edema, no wounds or ulcers Neuro is alert, Ox 3 , nf  LIJ TDC in place       Home meds:  - amiodarone 200 qd/ norvasc 2.5 qd/ lipitor 40/ plavix 75/ imdur 30 qd/ eliquis 2.5 bid/ metoprolol 25 bid/ sl ntg prn  - sod bicarb 325 bid/ phoslo 2 ac tid/ protonix 40/ synthroid 12.5 ug qd  - insulin lispro SSI 2- 12ug ac tid/ glargine 40u hs  - neurontin 100 bid  - prn's/ vitamins/ supplements    OP HD: [facility]  MWF  4h  450/800  117kg  2K/3Ca bath   TDC   Hep 4900 + 500u/hr  - zemplar 3.5 ug tiw  - aranesp 85 mg q wed   Assessment/ Plan: 1. Suicidal ideation - per psych/ ED, will need inpatient care 2. ESRD - gets HD [facility] in [facility], MWF. Started HD in 2017. HD today at [facility]. Would consider moving pt to [facility] if he will be here more than 1-2 days.  3. HTN - cont meds 4. Atrial fib - per pmd, on eliquis/ mtp/ amio 5. CAD hx CABG/ PCI - on imdur, BB, plavix 6. MBD ckd - cont binders 7. Anemia ckd - Hb 10.5, no need for esa at this time.     Kelly Splinter  MD 10/27/2020, 10:52  AM  Recent Labs  Lab 10/26/20 1452  WBC 9.2  HGB 10.5*   Recent Labs  Lab 10/26/20 1452  K 4.4  BUN 37*  CREATININE 6.14*  CALCIUM 8.7*

## 2020-10-27 NOTE — ED Notes (Signed)
Pt awakens to verbal stimuli.  Pt irritable, refusing to tell RN name or answer any questions.  Pt demanding belongings so he can leave.  RN informed pt that he is unable to leave at this time.  Sitter at bedside.

## 2020-10-27 NOTE — ED Notes (Signed)
Pt cooperative in taking meds, whole with water.

## 2020-10-27 NOTE — Procedures (Signed)
   I was present at this dialysis session, have reviewed the session itself and made  appropriate changes Kelly Splinter MD [place] pager [phone]   10/27/2020, 3:32 PM

## 2020-10-27 NOTE — Progress Notes (Signed)
Patient departed unit.Marland KitchenMarland KitchenMarland Kitchen

## 2020-10-27 NOTE — BH Assessment (Addendum)
Lindon Romp, NP, patient meets inpatient criteria. Geropsychiatry recommended. Disposition SW will secure placement in the AM. Dr. Roxanne Mins and Solmon Ice, RN, informed of disposition via secure chat.

## 2020-10-27 NOTE — ED Notes (Signed)
Assumed care of this patient. Patient resting with eyes closed. NAD. Respirations regular/unlabored. Connected to cardiac monitor, BP and pulse ox. Stretcher low, wheels locked, call bell within reach. Sitter at bedside.

## 2020-10-27 NOTE — BH Assessment (Addendum)
[place] Assessment Progress Note  Per Lindon Romp, NP, this pt requires psychiatric hospitalization at this time.  Pt presents under IVC initiated by law enforcement and upheld by EDP Lorre Munroe, MD.  The following facilities have been contacted to seek placement for this pt, with results as noted:  Beds available, information sent, decision pending: [facility] and [facility]) [facility]  Not Referred: [facility] (dialysis is exclusionary) [facility] (dialysis is exclusionary) [facility] (dialysis is exclusionary) [facility] (dialysis is exclusionary)  At capacity: [facility] and [facility])   Jalene Mullet, [place] Coordinator [phone]

## 2020-10-28 DIAGNOSIS — R4689 Other symptoms and signs involving appearance and behavior: Secondary | ICD-10-CM | POA: Diagnosis present

## 2020-10-28 LAB — CBG MONITORING, ED
Glucose-Capillary: 223 mg/dL — ABNORMAL HIGH (ref 70–99)
Glucose-Capillary: 179 mg/dL — ABNORMAL HIGH (ref 70–99)
Glucose-Capillary: 114 mg/dL — ABNORMAL HIGH (ref 70–99)

## 2020-10-28 MED ORDER — NYSTATIN 100000 UNIT/GM EX POWD
Freq: Once | CUTANEOUS | Status: AC
  Filled 2020-10-28: qty 15

## 2020-10-28 NOTE — ED Notes (Signed)
Pt requesting to be placed on buttock area. Notified MD Zenia Resides.

## 2020-10-28 NOTE — BH Assessment (Signed)
[place] Assessment Progress Note  Per Thomes Lolling, NP, this pt does not require psychiatric hospitalization at this time.  Pt presents under IVC initiated by law enforcement and upheld by EDP Lorre Munroe, MD, which has been rescinded by EDP Charlesetta Shanks, MD.  Pt is psychiatrically cleared.  Discharge instructions include referral information for the [facility] at [facility] with a brief synopsis of the programs that they offer.  A TOC consult has been ordered to address pt's psychosocial needs.  Dr Johnney Killian and pt's nurse, Loma Sousa, have been notified.  At 16:23 I called pt's wife, Missael Andre [phone]), a second time to follow up with her.  I informed her of pt's disposition.  She reports that she will be returning to the court to try to have anger management ordered for pt because she believes that he will not participate otherwise.  She asks that [facility] stay in communication with her about dispositional developments.  I have communicated this to the [facility] team.  Jalene Mullet, [place] Triage Specialist [phone]

## 2020-10-28 NOTE — Discharge Instructions (Signed)
For your behavioral health needs you are advised to follow up with the [facility] at [facility].  Due to Covid-19 most of their services are currently being offered virtually.  The programs they offer include:  Outpatient psychiatry: for medication management, by appointment Outpatient therapy: talk therapy, by appointment Mental Health Intensive Outpatient Programming: group therapy, meets daily Monday - Friday from 9:00 am to 12:00 pm, usually for about two weeks Partial Hospitalization Program: group therapy plus psychiatry, meets daily Monday - Friday from 9:00 am to 1:00 pm, usually for about two weeks  Call them at your earliest opportunity to schedule an intake appointment.       [facility]. [address] [place], [place] [address]      [phone]

## 2020-10-28 NOTE — ED Provider Notes (Signed)
Emergency Medicine Observation Re-evaluation Note  Kyle Walton is a 65 y.o. male, seen on rounds today.  Pt initially presented to the ED for complaints of Suicidal, Aggressive Behavior, verbal abuse, Hyperglycemia, and Homicidal Currently, the patient is resting in bed.  Requested fungal cream for groin rash..  Physical Exam  BP 108/73   Pulse (!) 124   Temp 98.9 F (37.2 C) (Oral)   Resp 19   Ht 1.778 m ('5\' 10"'$ )   Wt 124 kg   SpO2 98%   BMI 39.22 kg/m  Physical Exam General: Calm and cooperative   ED Course / MDM  EKG:  I have reviewed the labs performed to date as well as medications administered while in observation.  Recent changes in the last 24 hours include patient with known history of tachycardia.  Patient saw his physician a week ago and had his metoprolol increased from 25 twice daily to 50 mg twice daily.  Patient recently started receiving the appropriate dose here.  We will continue to monitor.  Patient receiving dialysis as needed per recommendations of nephrology.  Awaiting placement  Plan  Current plan is for placement. Patient is under full IVC at this time.   Lacretia Leigh, MD 10/28/20 [phone]

## 2020-10-28 NOTE — BH Assessment (Signed)
Call from [facility] to decline admission for pt at this time due to medical complexity.

## 2020-10-28 NOTE — ED Notes (Addendum)
Received pt from [employer]. Pt argumentative with sitter at this time. Introduced self to patient, tried to deescalate behavior. Pt unhappy with care. Explained to patient we have to try to keep patient safe.

## 2020-10-28 NOTE — Progress Notes (Signed)
[facility] Progress Note  Subjective: seen on HD, reports high heart rates but no SOB or CP  Vitals:   10/28/20 0751 10/28/20 0800 10/28/20 0911 10/28/20 1115  BP: 125/86 108/73  122/86  Pulse:  (!) 125 (!) 124 85  Resp: 19   19  Temp: 98.9 F (37.2 C)   98.8 F (37.1 C)  TempSrc: Oral   Oral  SpO2:  99% 98% 96%  Weight:      Height:        Exam:  alert, nad   no jvd  Chest cta bilat  Cor reg no RG  Abd soft ntnd no ascites   Ext no LE edema   Alert, NF, ox3  LIJ TDC in place   Home meds:  - amiodarone 200 qd/ norvasc 2.5 qd/ lipitor 40/ plavix 75/ imdur 30 qd/ eliquis 2.5 bid/ metoprolol 25 bid/ sl ntg prn  - sod bicarb 325 bid/ phoslo 2 ac tid/ protonix 40/ synthroid 12.5 ug qd  - insulin lispro SSI 2- 12ug ac tid/ glargine 40u hs  - neurontin 100 bid  - prn's/ vitamins/ supplements    OP HD: [facility]  MWF  4h  450/800  117kg  2K/3Ca bath   TDC   Hep 4900 + 500u/hr  - zemplar 3.5 ug tiw  - aranesp 85 mg q wed   Assessment/ Plan: 1. Suicidal ideation - per psych/ ED, will need inpatient care 2. ESRD - gets HD per [facility], MWF. Started HD in 2017. In [facility] ED and is being transported by ambulance back and forth to/ from [facility] for regular dialysis. HD today.  3. HTN/ vol - cont meds. Up 8kg pre HD today, got 3-4 L off today, cont to lower vol w/ HD as tolerated.  4. Atrial fib - per pmd, on eliquis/ mtp/ amio 5. CAD hx CABG/ PCI - on imdur, BB, plavix 6. MBD ckd - cont binders 7. Anemia ckd - Hb 10.5, no need for esa at this time.      Rob Doctor, hospital 10/29/2020, 7:31 AM   Recent Labs  Lab 10/26/20 1452  K 4.4  BUN 37*  CREATININE 6.14*  CALCIUM 8.7*  HGB 10.5*   Inpatient medications: . amiodarone  200 mg Oral Daily  . amLODipine  2.5 mg Oral Daily  . apixaban  2.5 mg Oral BID  . atorvastatin  40 mg Oral Daily  . calcium acetate  1,334 mg Oral TID with meals  . Chlorhexidine Gluconate Cloth  6 each Topical Q0600   . clopidogrel  75 mg Oral Daily  . gabapentin  100 mg Oral BID  . hydrOXYzine  25 mg Oral BID  . insulin aspart  0-6 Units Subcutaneous TID WC  . insulin aspart  2 Units Subcutaneous TID WC  . insulin glargine  29 Units Subcutaneous QHS  . isosorbide mononitrate  30 mg Oral Daily  . latanoprost  1 drop Both Eyes QHS  . levothyroxine  12.5 mcg Oral QAC breakfast  . LORazepam  1 mg Intramuscular Once  . metoprolol tartrate  50 mg Oral Daily  . pantoprazole  40 mg Oral Daily  . sodium bicarbonate  325 mg Oral BID  . Vitamin D (Ergocalciferol)  50,000 Units Oral Q30 days    acetaminophen, alum & mag hydroxide-simeth, nitroGLYCERIN, ondansetron

## 2020-10-28 NOTE — ED Notes (Signed)
Belongings returned to patient. RN Janett Billow given report. Pt updated as to why he is being moved. Pt calm, cooperative, displays understanding.

## 2020-10-28 NOTE — BH Assessment (Signed)
[place] Assessment Progress Note   This Probation officer received a call from social work reporting that pt's wife, Ahmaud Briskey [phone]), has called to inquire about pt's disposition.  Pt's nurse, Garlan Fair, reports that pt has given consent to release information to her.  At 09:54 I called her, reporting that pt is under IVC and that he requires psychiatric hospitalization at a facility providing specialty care for geriatric patients at this time, which I am seeking for him.  I added that given his combative behavior with ED staff early in his admission, as well as his need for dialysis, it will be difficult to find a facility that can attend to his needs.  I added that our psychiatry team will round on pt regularly in an effort to stabilize him in the meantime.  She had a few questions about potential length of stay, and facilities that might consider him for admission, to which I responded.  I offered by contact information, which she took.  Final disposition is pending as of this writing.  Jalene Mullet, [place] Coordinator [phone]

## 2020-10-28 NOTE — Progress Notes (Signed)
. ..   Transition of Care [facility]) - Emergency Department Mini Assessment   Patient Details  Name: Kyle Walton MRN: [id number] Date of Birth: 1955/10/31  [facility]) CM/SW Contact:    Erenest Rasher, RN Phone Number: [phone] 10/28/2020, 9:56 PM   Clinical Narrative: TOC CM spoke to pt and states he lives at home with wife. Has hoveround wheelchair, rw, cane and bedside commode. Pt provided contact for brother. Gave permission to speak to son or brother. Wife is not allowing pt to return home. Waiting for PT evaluation.    ED Mini Assessment: What brought you to the Emergency Department? : aggressive behaviors  Barriers to Discharge: Homeless with medical needs,ED Facility/Family Refusing to Allow Patient to Return        Interventions which prevented an admission or readmission: [place] or Services    Patient Contact and Communications        ,                 Admission diagnosis:  Suicidal ideation [R45.851] Aggressive behavior [R46.89] Homicidal ideation [R45.850] Chest pain [R07.9] Patient Active Problem List   Diagnosis Date Noted  . Aggressive behavior   . CAD (coronary artery disease) 10/27/2020  . 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  . Cardiopulmonary arrest with successful resuscitation ([place]) 10/27/2020  . Diabetes mellitus with peripheral vascular disease ([place]) 10/27/2020  . History of DVT (deep vein thrombosis) 10/27/2020  . Essential hypertension 10/27/2020  . OSA (obstructive sleep apnea) 10/27/2020  . Paroxysmal atrial fibrillation ([place]) 10/27/2020  . Secondary hyperparathyroidism, renal ([place]) 10/27/2020  . 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 Chronic diastolic congestive heart failure, NYHA class 4 ([place]) 10/27/2020  . ESRD on hemodialysis ([place]) 10/27/2020  . Impaired physical mobility 10/27/2020  . Chronic respiratory failure with hypercapnia ([place]) 10/27/2020  . 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 History of major depression 10/27/2020  . History of macular degeneration 10/27/2020  . Legally blind 10/27/2020   PCP:  Patient, No Pcp Per (Inactive) Pharmacy:   [facility] [address] [address] Phone: [phone] Fax: [phone]

## 2020-10-28 NOTE — ED Notes (Signed)
Patient will not leave BP cuff, monitor and pulse ox on. RN explained that we monitor patient continuously while in the ED. Patient continues to remove all equipment.

## 2020-10-28 NOTE — ED Provider Notes (Signed)
Patient has been psychiatrically cleared by behavioral health.  IVC rescinded per behavioral health recommendation.  TOC consult has been placed by behavioral health.  Apparently patient cannot go home and has a restraining order from his wife.   Charlesetta Shanks, MD 10/28/20 [phone]

## 2020-10-28 NOTE — ED Notes (Signed)
Kyle Walton refusing to remain on strecher hospital bed provided  Kyle Walton the [facility] at bedside when pt was informed he could not ambulate alone. Pt continues to insist he did not need assistance ambulating. Kyle Walton appears alert x3 answers questions appropriately and verbalize understanding that he was not to ambulate w/o staff assist but continues to insist that his gait was study. All high falls risk precautions were taken pt wearing yellow socks and will be given yellow arm band.

## 2020-10-28 NOTE — ED Notes (Signed)
Patient received breakfast tray 

## 2020-10-28 NOTE — ED Notes (Signed)
Pt more cooperative, appreciative of nystatin powder, which was applied to peri area. Peri area appears reddened. Offered to change pt linens and gown, pt refused.

## 2020-10-28 NOTE — ED Notes (Signed)
Patient ambulated to the bathroom with front wheel walker

## 2020-10-28 NOTE — Consult Note (Addendum)
[facility] Psychiatry Consult   Reason for Consult:  Psychiatric reassessment related to aggressive behavior  Referring Physician:  Dr. 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 male patient brought to the emergency room by police with an IVC order.    [name], 65 y.o., male patient seen face to face by this provider, consulted with Dr. Mauro Kaufmann; and chart reviewed on 10/28/20.  On evaluation Frasier Springborn reports "This whole situation is messed up, I don't know why my wife would do this"  HPI:    During evaluation Kyle Walton is in siting position in no acute distress.  He is alert, oriented x 4, calm, cooperative and attentive. He reports his mood is depressed with congruent affect. He is speech is normal rate and tone.   Objectively there is no evidence of psychosis/mania or delusional thinking.  Patient is able to converse coherently, goal directed thoughts, no distractibility, or pre-occupation.He also denies suicidal/self-harm/homicidal  ideation, psychosis, and paranoia. States in the heat of the moment he has said " I would be better off dead, but I have never said I would kill my self". He states he would never do that to his children. States he loves them too much.   Patient states he knows he has temper and he would like some resouces for therapist, either group or individual. Reports he did get angry and did run over the police officers foot with his hover chair when they came to pick him up. States, "I realize that was not the best way to handle that situation". States he has never physically assaulted his wife. Reports he has been verbally abusive to her and even the staff in the hospital. Reports he is frustrated, and states he knows he needs to handle himself better.Reports since he had his heart attack and he has been on dialysis he has been depressed, he states. "why shouldn't I be, I cant do the things that I once could".   Reports he has no access to  Firearms/weopons. Reports occasional alcohol use, maybe 2 drinks a week.  Reports he has one brother and 6 children that he is very close with. States his wife has taken out a restraining order against him. He states he will not contact her. Reports he knows if if he were to be discharged he knows he cant go home. At this time he has no alternative place to live. Patient contracts to keep him self and others safe.   Patient has requested out patient psychiatric resources for individual or group therapy and anger management.  He currently has none in place. He does have transportation in place for dialysis.    Past Psychiatric History: denies  Risk to Self:  patient denies Risk to Others:  patient denies Prior Inpatient Therapy:  patient denies Prior Outpatient Therapy:  patient denies  Past Medical History: No past medical history on file. No past surgical history on file. Family History: No family history on file. Family Psychiatric  History: denies Social History:   Social History   Substance and Sexual Activity  Alcohol Use Not on file     Social History   Substance and Sexual Activity  Drug Use Not on file    Social History   Socioeconomic History  . Marital status: Single    Spouse name: Not on file  . Number of children: Not on file  . Years of education: Not on file  . Highest education level: Not on file  Occupational History  . Not on file  Tobacco Use  . Smoking status: Not on file  . Smokeless tobacco: Not on file  Substance and Sexual Activity  . Alcohol use: Not on file  . Drug use: Not on file  . Sexual activity: Not on file  Other Topics Concern  . Not on file  Social History Narrative  . Not on file   Social Determinants of Health   Financial Resource Strain: Not on file  Food Insecurity: Not on file  Transportation Needs: Not on file  Physical Activity: Not on file  Stress: Not on file  Social Connections: Not on file   Additional Social History:    Allergies:   Allergies  Allergen Reactions  . Morphine Other (See Comments)    Per son, [name], pt becomes unresponsive/disoriented. Especially when given after HD tx  . Liraglutide Diarrhea    Phenol Phenol     Labs:  Results for orders placed or performed during the hospital encounter of 10/26/20 (from the past 48 hour(s))  CBG monitoring, ED     Status: Abnormal   Collection Time: 10/27/20  1:57 AM  Result Value Ref Range   Glucose-Capillary 262 (H) 70 - 99 mg/dL    Comment: Glucose reference range applies only to samples taken after fasting for at least 8 hours.  POC CBG, ED     Status: Abnormal   Collection Time: 10/27/20 12:49 PM  Result Value Ref Range   Glucose-Capillary 213 (H) 70 - 99 mg/dL    Comment: Glucose reference range applies only to samples taken after fasting for at least 8 hours.  Hepatitis B surface antibody,qualitative     Status: None   Collection Time: 10/27/20  4:15 PM  Result Value Ref Range   Hep B S Ab NON REACTIVE NON  REACTIVE    Comment: (NOTE) Inconsistent with immunity, less than 10 mIU/mL.  Performed at [facility], [place] [address] [place], [place] [address]   Hepatitis B core antibody, total     Status: None   Collection Time: 10/27/20  4:15 PM  Result Value Ref Range   Hep B Core Total Ab NON REACTIVE NON REACTIVE    Comment: Performed at [facility] [address] [place], [place] [address]  Hepatitis B surface antigen     Status: None   Collection Time: 10/27/20  5:00 PM  Result Value Ref Range   Hepatitis B Surface Ag NON REACTIVE NON REACTIVE    Comment: Performed at [facility] [address]  [place], [place] [address]  POC CBG, ED     Status: Abnormal   Collection Time: 10/27/20  9:47 PM  Result Value Ref Range   Glucose-Capillary 166 (H) 70 - 99 mg/dL    Comment: Glucose reference range applies only to samples taken after fasting for at least 8 hours.  POC CBG, ED     Status: Abnormal   Collection Time: 10/28/20  7:40 AM  Result Value Ref Range   Glucose-Capillary 223 (H) 70 - 99 mg/dL    Comment: Glucose reference range applies only to samples taken after fasting for at least 8 hours.  POC CBG, ED     Status: Abnormal   Collection Time: 10/28/20 12:23 PM  Result Value Ref Range   Glucose-Capillary 179 (H) 70 - 99 mg/dL    Comment: Glucose reference range applies only to samples taken after fasting for at least 8 hours.    Current Facility-Administered Medications  Medication Dose Route Frequency Provider Last Rate Last Admin  . acetaminophen (TYLENOL) tablet 650 mg  650 mg Oral [id number] PRN Delora Fuel, MD      . alum & mag hydroxide-simeth (MAALOX/MYLANTA) 200-200-20 MG/5ML suspension 30 mL  30 mL Oral [id number] PRN Delora Fuel, MD      . amiodarone (PACERONE) tablet 200 mg  [id number] mg Oral Daily Delora Fuel, MD   [id number] mg at 10/28/20 [id number]  . amLODipine (NORVASC) tablet 2.5 mg  2.5 mg Oral Daily Delora Fuel, MD   2.5 mg at 10/28/20 0902  . apixaban (ELIQUIS)  tablet 2.5 mg  2.5 mg Oral BID Delora Fuel, MD   2.5 mg at 10/28/20 [id number]  . atorvastatin (LIPITOR) tablet 40 mg  40 mg Oral Daily Delora Fuel, MD   40 mg at 10/28/20 [id number]  . calcium acetate (PHOSLO) capsule 1,334 mg  1,334 mg Oral TID with meals Delora Fuel, MD   [id number] mg at 10/28/20 1250  . Chlorhexidine Gluconate Cloth 2 % PADS 6 each  6 each Topical Q0600 Roney Jaffe, MD   6 each at 10/28/20 [phone]  . clopidogrel (PLAVIX) tablet 75 mg  75 mg Oral Daily Delora Fuel, MD   75 mg at 10/28/20 0902  . gabapentin (NEURONTIN) capsule 100 mg  [id number] mg Oral BID Delora Fuel, MD   [id number] mg at 10/28/20 0902  . hydrOXYzine (ATARAX/VISTARIL) tablet 25 mg  25 mg Oral BID Delora Fuel, MD   25 mg at 10/28/20 0902  . insulin aspart (novoLOG) injection 0-6 Units  0-6 Units Subcutaneous TID WC Luna Fuse, MD   1 Units at 10/28/20 1251  . insulin aspart (novoLOG) injection 2 Units  2 Units Subcutaneous TID WC Delora Fuel, MD   2 Units at 10/28/20 1251  . insulin glargine (LANTUS) injection 29 Units  29 Units Subcutaneous QHS Delora Fuel, MD   29 Units at 10/28/20 0021  . isosorbide mononitrate (IMDUR) 24 hr tablet 30 mg  30 mg Oral Daily Delora Fuel, MD   30 mg at 10/28/20 0902  . latanoprost (XALATAN) 0.005 % ophthalmic solution 1 drop  1 drop Both Eyes QHS Delora Fuel, MD   1 drop at 10/28/20 0023  . levothyroxine (SYNTHROID) tablet 12.5 mcg  12.5 mcg Oral QAC breakfast Delora Fuel, MD   [id number] mcg at 10/28/20 0618  . LORazepam (ATIVAN) injection 1 mg  1 mg Intramuscular Once Upstill, Shari, PA-C      . metoprolol tartrate (LOPRESSOR) tablet 50 mg  50 mg Oral Daily Roxanne Mins,  Shanon Brow, MD   50 mg at 10/28/20 0901  . nitroGLYCERIN (NITROSTAT) SL tablet 0.4 mg  0.4 mg Sublingual Q5 min PRN Delora Fuel, MD      . ondansetron [facility]) tablet 4 mg  4 mg Oral [id number] PRN Delora Fuel, MD      . pantoprazole (PROTONIX) EC tablet 40 mg  40 mg Oral Daily Delora Fuel, MD   40 mg at 10/28/20 0901  . sodium bicarbonate  tablet 325 mg  [id number] mg Oral BID Delora Fuel, MD   [id number] mg at 10/28/20 [id number]  . Vitamin D (Ergocalciferol) (DRISDOL) capsule 50,000 Units  50,000 Units Oral Q30 days Lenis Noon, [facility]   50,000 Units at 10/27/20 1029   Current Outpatient Medications  Medication Sig Dispense Refill  . amiodarone (PACERONE) 200 MG tablet Take 200 mg by mouth daily.    Marland Kitchen amLODipine (NORVASC) 2.5 MG tablet Take 2.5 mg by mouth daily.    Marland Kitchen atorvastatin (LIPITOR) 40 MG tablet Take 40 mg by mouth daily.    . calcium acetate (PHOSLO) 667 MG capsule Take 1,334 mg by mouth 3 (three) times daily with meals.    . clopidogrel (PLAVIX) 75 MG tablet Take 75 mg by mouth daily.    Marland Kitchen ELIQUIS 2.5 MG TABS tablet Take 2.5 mg by mouth 2 (two) times daily.    . ergocalciferol (VITAMIN D2) 1.25 MG (50000 UT) capsule Take 50,000 Units by mouth every 30 (thirty) days.    Marland Kitchen gabapentin (NEURONTIN) 100 MG capsule Take 100 mg by mouth in the morning and at bedtime.    Marland Kitchen HYDROcodone-acetaminophen (NORCO/VICODIN) 5-325 MG tablet Take 1 tablet by mouth every 6 (six) hours as needed for moderate pain. 12 tablet 0  . hydrOXYzine (ATARAX/VISTARIL) 25 MG tablet Take 25 mg by mouth 2 (two) times daily.    . Insulin Glargine (BASAGLAR KWIKPEN) 100 UNIT/ML Inject 40 Units into the skin at bedtime.    . insulin lispro (HUMALOG) 100 UNIT/ML KwikPen Inject into the skin. Inject two Units to twelve Units into the skin 15 (fifteen) minutes before meals for 30 days. 180-200 = 2 units 201-250 = 5 units 251-300 = 7 units 301-400 = 12 units and call MD    . isosorbide mononitrate (IMDUR) 30 MG 24 hr tablet Take 30 mg by mouth daily.    Marland Kitchen latanoprost (XALATAN) 0.005 % ophthalmic solution Place 1 drop into both eyes at bedtime.    Marland Kitchen levothyroxine (SYNTHROID) 25 MCG tablet Take 12.5 mcg by mouth daily before breakfast.    . metoprolol tartrate (LOPRESSOR) 50 MG tablet Take 25 mg by mouth 2 (two) times daily.    . nitroGLYCERIN (NITROSTAT) 0.3 MG SL tablet Place 0.3  mg under the tongue every 5 (five) minutes as needed for chest pain.    . pantoprazole (PROTONIX) 40 MG tablet Take 1 tablet by mouth daily.    . sodium bicarbonate 650 MG tablet Take 325 mg by mouth 2 (two) times daily.      Musculoskeletal: Strength & Muscle Tone: decreased Gait & Station: normal, unsteady Patient leans: N/A  Psychiatric Specialty Exam:  Presentation  General Appearance: Disheveled  Eye Contact:Good  Speech:Clear and Coherent; Normal Rate  Speech Volume:Normal  Handedness:Right   Mood and Affect  Mood:Depressed  Affect:Congruent; Depressed   Thought Process  Thought Processes:Goal Directed  Descriptions of Associations:Intact  Orientation:Full (Time, Place and Person)  Thought Content:Logical  History of Schizophrenia/Schizoaffective disorder:No data recorded Duration of Psychotic Symptoms:No data recorded Hallucinations:Hallucinations:  None  Ideas of Reference:None  Suicidal Thoughts:Suicidal Thoughts: No  Homicidal Thoughts:Homicidal Thoughts: No   Sensorium  Memory:Immediate Good; Recent Good; Remote Good  Judgment:Fair  Insight:Good   Executive Functions  Concentration:Good  Attention Span:Good  [place] of Knowledge:Good  Language:Good   Psychomotor Activity  Psychomotor Activity:Psychomotor Activity: Normal   Assets  Assets:Communication Skills; Desire for Improvement; Financial Resources/Insurance; Resilience; Social Support   Sleep  Sleep:Sleep: Good   Physical Exam: Physical Exam Vitals reviewed.  HENT:     Head: Normocephalic.     Right Ear: Tympanic membrane normal.     Left Ear: Tympanic membrane normal.     Nose: Nose normal.     Mouth/Throat:     Pharynx: Oropharynx is clear.  Eyes:     Conjunctiva/sclera: Conjunctivae normal.  Cardiovascular:     Rate and Rhythm: Normal rate.     Pulses: Normal pulses.  Pulmonary:     Effort: Pulmonary effort is normal. No respiratory distress.   Abdominal:     Tenderness: There is no guarding.  Musculoskeletal:        General: Normal range of motion.     Cervical back: Normal range of motion.  Skin:    General: Skin is warm and dry.  Neurological:     Mental Status: He is alert and oriented to person, place, and time.  Psychiatric:        Attention and Perception: Attention normal.        Mood and Affect: Mood is depressed.        Speech: Speech normal.        Behavior: Behavior normal. Behavior is not agitated or aggressive. Behavior is cooperative.        Thought Content: Thought content normal. Thought content is not paranoid or delusional. Thought content does not include homicidal or suicidal ideation. Thought content does not include homicidal or suicidal plan.        Cognition and Memory: Cognition normal.        Judgment: Judgment is impulsive.    Review of Systems  Constitutional: Negative.   HENT: Negative.   Eyes: Positive for blurred vision.  Respiratory: Negative.   Cardiovascular: Negative.   Gastrointestinal: Negative.   Genitourinary: Negative.   Musculoskeletal: Negative.   Skin: Negative.   Neurological: Negative.   Endo/Heme/Allergies: Negative.   Psychiatric/Behavioral: Negative.    Blood pressure 122/86, pulse 85, temperature 98.8 F (37.1 C), temperature source Oral, resp. rate 19, height '5\' 10"'$  (1.778 m), weight 124 kg, SpO2 96 %. Body mass index is 39.22 kg/m.  Treatment Plan Summary: Follow up outpatient resources provided  for behavorial health services.   No new medication recommendations at this time.   Continue at home medication Vistaril 25 mg PO BID   Disposition: No evidence of imminent risk to self or others at present.   Patient does not meet criteria for psychiatric inpatient admission. Supportive therapy provided about ongoing stressors. Discussed crisis plan, support from social network, calling 911, coming to the Emergency Department, and calling Suicide  Hotline.  Patient is psychiatrically cleared.   Revonda Humphrey, NP 10/28/2020 3:13 PM

## 2020-10-29 LAB — CBG MONITORING, ED
Glucose-Capillary: 117 mg/dL — ABNORMAL HIGH (ref 70–99)
Glucose-Capillary: 148 mg/dL — ABNORMAL HIGH (ref 70–99)
Glucose-Capillary: 196 mg/dL — ABNORMAL HIGH (ref 70–99)
Glucose-Capillary: 136 mg/dL — ABNORMAL HIGH (ref 70–99)

## 2020-10-29 MED ORDER — MELATONIN 3 MG PO TABS
3.0000 mg | ORAL_TABLET | Freq: Every evening | ORAL | Status: DC | PRN
  Administered 2020-10-29: 3 mg via ORAL
  Filled 2020-10-29 (×2): qty 1

## 2020-10-29 NOTE — ED Notes (Signed)
Pt alert, calm, cooperative. Pt completed dialysis today. Pt completed fine. Pt resting comfortably.
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
## 2020-10-29 NOTE — Progress Notes (Signed)
Received call from [facility] regarding patient initiated phone calls to his wife from facility. I was advised by staff that patient's wife has a restraining order against him and  he continues to make inappropriate phone calls. Spoke with dialysis director Ronny Bacon who advised that per [facility], we are not able to interfere with patient's personal belongings including his cell phone. He has not been permitted to use any other phone during duration of his time in this facility. Patient's wife is permitted to contact hospital security and request his phone calls be restricted if she feels the need to do so. Spoke with nursing team at [facility] and advised of limitations.

## 2020-10-29 NOTE — BH Assessment (Addendum)
Kyle Walton Assessment Progress Note   At 10:44 this Probation officer received a call from pt's wife, Kyle Walton [phone]), who had left a voice message for me.  She reports that pt has been calling her and harassing her, in spite of restraining order that she has taken out.  She notes that she is going to the court house later today to reinforce her case against the pt and will be citing these calls.  She asks that while he is in our care we take every effort permitted to prevent him from making further calls to her.  She adds that she has suspended pt's cell phone service, so pt must be using our phones to call her.  I have noted her request in pt FYI's.  At 12:05 I called [facility], where pt is currently being served, and spoke to pt's care provider, Kyle Walton, to inform her of these details.  At 12:18 I sent a secure chat to Kyle Walton, pt's nurse at [facility] with same details.  I have also included Kyle Pattricia Boss, Kyle Walton and Kyle Walton staff in conversation.  Jalene Mullet, [place] Coordinator [phone]

## 2020-10-29 NOTE — NC FL2 (Signed)
[place] LEVEL OF CARE SCREENING TOOL     IDENTIFICATION  Patient Name: Kyle Walton Birthdate: 30-Sep-1955 Sex: male Admission Date (Current Location): 10/26/2020  [facility] and [place] Number:  Herbalist and Address:  [facility],  [place] [address] [place]      Provider Number: [id number]  Attending Physician Name and Address:  Default, Provider, MD  Relative Name and Phone Number:  Hiep Ripley [phone]    Current Level of Care: Hospital Recommended Level of Care: [place] Prior Approval Number:    Date Approved/Denied:   PASRR Number: [id number]  Discharge Plan: SNF    Current Diagnoses: Patient Active Problem List   Diagnosis Date Noted  . Aggressive behavior   . CAD (coronary artery disease) 10/27/2020  . 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  . Cardiopulmonary arrest with successful resuscitation ([place]) 10/27/2020  . Diabetes mellitus with peripheral vascular disease ([place]) 10/27/2020  . History of DVT (deep vein thrombosis) 10/27/2020  . Essential hypertension 10/27/2020  . OSA (obstructive sleep apnea) 10/27/2020  . Paroxysmal atrial fibrillation ([place]) 10/27/2020  . Secondary hyperparathyroidism, renal ([place]) 10/27/2020  . 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 Chronic diastolic congestive heart failure, NYHA class 4 ([place]) 10/27/2020  . ESRD on hemodialysis ([place]) 10/27/2020  . Impaired physical mobility 10/27/2020  . Chronic respiratory failure with hypercapnia ([place]) 10/27/2020  . 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 History of major depression 10/27/2020  . History of macular degeneration 10/27/2020  . Legally blind 10/27/2020    Orientation RESPIRATION BLADDER Height & Weight     Self,Place  Normal Continent Weight: 277 lb 1.9 oz (125.7 kg) Height:  '5\' 10"'$  (177.8 cm)  BEHAVIORAL SYMPTOMS/MOOD NEUROLOGICAL BOWEL NUTRITION STATUS       Continent Diet (Carb Modified)  AMBULATORY STATUS COMMUNICATION OF NEEDS Skin   Limited Assist Verbally Normal                       Personal Care Assistance Level of Assistance  Bathing,Feeding,Dressing Bathing Assistance: Limited assistance Feeding assistance: Independent Dressing Assistance: Limited assistance     Functional Limitations Info  Sight,Hearing,Speech Sight Info: Adequate Hearing Info: Adequate Speech Info: Adequate    SPECIAL CARE FACTORS FREQUENCY  PT (By licensed PT),OT (By licensed OT)     PT Frequency: 5x a week OT Frequency: 5x a week            Contractures Contractures Info: Not present    Additional Factors Info  Code Status,Allergies Code Status Info: Full Allergies Info: Morphine and Liraglutide           Current Medications (10/29/2020):  This is the current hospital active medication list Current Facility-Administered Medications  Medication Dose Route Frequency Provider Last Rate Last Admin  . acetaminophen (TYLENOL) tablet 650 mg  650 mg Oral [id number] PRN Delora Fuel, MD      . alum & mag hydroxide-simeth (MAALOX/MYLANTA) 200-200-20 MG/5ML suspension 30 mL  30 mL Oral [id number] PRN Delora Fuel, MD      . amiodarone (PACERONE) tablet 200 mg  [id number] mg Oral Daily Delora Fuel, MD   [id number] mg at 10/29/20 [id number]  . amLODipine (NORVASC) tablet 2.5 mg  2.5 mg Oral Daily Delora Fuel, MD   2.5 mg at 10/28/20 0902  . apixaban (ELIQUIS) tablet 2.5 mg  2.5 mg Oral BID Delora Fuel, MD   2.5 mg at  10/28/20 2253  . atorvastatin (LIPITOR) tablet 40 mg  40 mg Oral Daily Delora Fuel, MD   40 mg at 10/28/20 [id number]  . calcium acetate (PHOSLO) capsule 1,334 mg  1,334 mg Oral TID with meals Delora Fuel, MD   [id number] mg at 10/28/20 1734  . Chlorhexidine Gluconate Cloth 2 % PADS 6 each  6 each Topical Q0600 Roney Jaffe, MD   6 each at 10/28/20 [phone]  . clopidogrel (PLAVIX) tablet 75 mg  75 mg Oral Daily Delora Fuel, MD   75 mg at 10/28/20 0902  . gabapentin  (NEURONTIN) capsule 100 mg  [id number] mg Oral BID Delora Fuel, MD   [id number] mg at 10/28/20 2254  . hydrOXYzine (ATARAX/VISTARIL) tablet 25 mg  25 mg Oral BID Delora Fuel, MD   25 mg at 10/28/20 2253  . insulin aspart (novoLOG) injection 0-6 Units  0-6 Units Subcutaneous TID WC Luna Fuse, MD   1 Units at 10/28/20 1251  . insulin aspart (novoLOG) injection 2 Units  2 Units Subcutaneous TID WC Delora Fuel, MD   2 Units at 10/28/20 1732  . insulin glargine (LANTUS) injection 29 Units  29 Units Subcutaneous QHS Delora Fuel, MD   29 Units at 10/28/20 2254  . isosorbide mononitrate (IMDUR) 24 hr tablet 30 mg  30 mg Oral Daily Delora Fuel, MD   30 mg at 10/28/20 0902  . latanoprost (XALATAN) 0.005 % ophthalmic solution 1 drop  1 drop Both Eyes QHS Delora Fuel, MD   1 drop at 10/28/20 0023  . levothyroxine (SYNTHROID) tablet 12.5 mcg  12.5 mcg Oral QAC breakfast Delora Fuel, MD   [id number] mcg at 10/29/20 0630  . LORazepam (ATIVAN) injection 1 mg  1 mg Intramuscular Once Upstill, Shari, PA-C      . metoprolol tartrate (LOPRESSOR) tablet 50 mg  50 mg Oral Daily Delora Fuel, MD   50 mg at 10/28/20 0901  . nitroGLYCERIN (NITROSTAT) SL tablet 0.4 mg  0.4 mg Sublingual Q5 min PRN Delora Fuel, MD      . ondansetron [facility]) tablet 4 mg  4 mg Oral [id number] PRN Delora Fuel, MD      . pantoprazole (PROTONIX) EC tablet 40 mg  40 mg Oral Daily Delora Fuel, MD   40 mg at 10/28/20 0901  . sodium bicarbonate tablet 325 mg  [id number] mg Oral BID Delora Fuel, MD   [id number] mg at 10/28/20 2252  . Vitamin D (Ergocalciferol) (DRISDOL) capsule 50,000 Units  50,000 Units Oral Q30 days Lenis Noon, [facility]   50,000 Units at 10/27/20 1029     Discharge Medications: Please see discharge summary for a list of discharge medications.  Relevant Imaging Results:  Relevant Lab Results:   Additional Information SSN#:  [id number]  Exavier Lina C Tarpley-Carter, LCSWA

## 2020-10-29 NOTE — ED Notes (Addendum)
Report given to Loma Sousa, RN (Dialysis) at [facility].  Her callback number is [phone].  Reports patient can arrive after 8 am.

## 2020-10-30 LAB — CBG MONITORING, ED
Glucose-Capillary: 125 mg/dL — ABNORMAL HIGH (ref 70–99)
Glucose-Capillary: 177 mg/dL — ABNORMAL HIGH (ref 70–99)
Glucose-Capillary: 237 mg/dL — ABNORMAL HIGH (ref 70–99)
Glucose-Capillary: 178 mg/dL — ABNORMAL HIGH (ref 70–99)

## 2020-10-30 MED ORDER — BACITRACIN ZINC 500 UNIT/GM EX OINT
TOPICAL_OINTMENT | CUTANEOUS | Status: AC
  Administered 2020-10-30: 1
  Filled 2020-10-30: qty 2.7
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
## 2020-10-30 NOTE — ED Notes (Addendum)
Patient mood has been up and down since the beginning of shift. Patient stated " I want to die" "I've lost everything" patient then started crying. Patient later told primary nurse he wants " to speak to a real nurse". Patient has picked his scabs that are all over his arm and they began bleeding. Primary nurse went in to assess and patient told nurse he did not want any bandages or anything for his arms. 5 minutes later patient called out for a bandage. Patient has been having this type of behavior all night so far.

## 2020-10-30 NOTE — ED Notes (Signed)
Pt has had a visit with chaplin today. Eats and takes medications with no issues. Skin has multiple areas of open lesions, wound consult put in.

## 2020-10-30 NOTE — Progress Notes (Signed)
Chaplain responded to request from patient/RN for spiritual/psychological support.  Patient has been here since Wednesday due to incident in the home.  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  Patent had 4 children with first wife and 2 with second.  States he is married now.  Patient shared about a heart attack 4 years ago that significantly affected his health and he has declined.  In relationship to why he is here he says when the police came he said he might be better off dead, but claims he has no intent to take his life.  Chaplain offered a non anxious presence with primary response to provide empathetic/reflective listening.  Patient is Kyle Walton and shared prayer and a belief God has a plan has given him encouragement.  Chaplain and patient discussed several Scriptural references for him to discern.  Patient states he would like to get some support in figuring out what is next.  Chaplain provided prayer.  Chaplain will ask other chaplains to follow up in the next few days.   Dawes, Mdiv.    10/30/20 1420  Clinical Encounter Type  Visited With Patient;Health care provider  Visit Type Spiritual support;Psychological support;Behavioral Health  Referral From Patient;Nurse  Consult/Referral To Chaplain  Spiritual Encounters  Spiritual Needs Prayer;Emotional  Stress Factors  Patient Stress Factors Family relationships;Health changes;Loss of control

## 2020-10-30 NOTE — ED Notes (Signed)
Pt resting with CPAP machine on at this. I will continue to monitor.
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
## 2020-10-30 NOTE — ED Notes (Signed)
MD aware of CBG still advised it was okay you give nighttime insulin lantus.

## 2020-10-31 LAB — CBG MONITORING, ED
Glucose-Capillary: 112 mg/dL — ABNORMAL HIGH (ref 70–99)
Glucose-Capillary: 168 mg/dL — ABNORMAL HIGH (ref 70–99)
Glucose-Capillary: 193 mg/dL — ABNORMAL HIGH (ref 70–99)
Glucose-Capillary: 202 mg/dL — ABNORMAL HIGH (ref 70–99)

## 2020-10-31 MED ORDER — DARBEPOETIN ALFA 100 MCG/0.5ML IJ SOSY
80.0000 ug | PREFILLED_SYRINGE | INTRAMUSCULAR | Status: DC
  Administered 2020-11-01: 80 ug via INTRAVENOUS
  Filled 2020-10-31 (×2): qty 0.5

## 2020-10-31 MED ORDER — METOPROLOL TARTRATE 25 MG PO TABS
25.0000 mg | ORAL_TABLET | Freq: Two times a day (BID) | ORAL | Status: DC
  Administered 2020-10-31 – 2020-11-01 (×2): 25 mg via ORAL
  Filled 2020-10-31 (×3): qty 1

## 2020-10-31 NOTE — Progress Notes (Addendum)
[facility] Progress Note  Subjective: seen in ED at [place], no new c/o's.  No longer under IVC. Pt saw patient and determined he needs SNF placement. FL2 created on 4/22.   Vitals:   10/30/20 1747 10/30/20 2100 10/31/20 0452 10/31/20 1052  BP: 111/74 (!) 1'03/58 92/75 93/77 '$  Pulse: (!) 116 91 82 (!) 120  Resp: '18 20 20 20  '$ Temp: 97.9 F (36.6 C) 98.4 F (36.9 C) (!) 97.3 F (36.3 C) 98.1 F (36.7 C)  TempSrc: Oral Oral Axillary Oral  SpO2: 93% 90% 99% 99%  Weight:      Height:        Exam:  alert, nad   no jvd  Chest cta bilat  Cor reg no RG  Abd soft ntnd no ascites   Ext no LE edema   Alert, NF, ox3  LIJ TDC in place   Home meds:  - amiodarone 200 qd/ norvasc 2.5 qd/ lipitor 40/ plavix 75/ imdur 30 qd/ eliquis 2.5 bid/ metoprolol 25 bid/ sl ntg prn  - sod bicarb 325 bid/ phoslo 2 ac tid/ protonix 40/ synthroid 12.5 ug qd  - insulin lispro SSI 2- 12ug ac tid/ glargine 40u hs  - neurontin 100 bid  - prn's/ vitamins/ supplements    OP HD: [facility]  MWF  4h  450/800  117kg  2K/3Ca bath   TDC   Hep 4900 + 500u/hr  - zemplar 3.5 ug tiw  - aranesp 85 mg q wed     CXR 4/19 - poor film, IMPRESSION > Low volume film with cardiomegaly and vascular congestion.     BS's 150 - 200 range    Assessment/ Plan: 1. Suicidal ideation - was IVC'd initially but no longer in place.  2. Debility - per PT needs SNF placement. Pt states he might be going home.   3. ESRD - gets HD per [facility] in [facility], MWF. Started HD in 2017. In [facility] ED and is being transported by ambulance back and forth to/ from [facility] for regular dialysis. Get labs MWF. Would consider transferring pt to the [facility] ED.  4. HTN/ vol - BP's are soft, have dc'd low dose norvasc. Need standing wt next HD. Not sure wt's are accurate. Euvolemic on exam. Lower UF goal w/ HD tomorrow. Have split metoprolol to 25 bid w/ hold orders.  5. MBD ckd - cont phoslo, vdra per pharm 6. Anemia ckd - last Hb 10.5,  no need for esa at this time. Cont darbe 75 weekly 7. Atrial fib - per pmd, on home eliquis 2.5 bid and home amio/ metoprolol 8. CAD hx CABG/ PCI - on home imdur, plavix, metoprolol     Rob Jayleana Colberg 10/29/2020, 7:31 AM   Recent Labs  Lab 10/26/20 1452  K 4.4  BUN 37*  CREATININE 6.14*  CALCIUM 8.7*  HGB 10.5*   Inpatient medications: . amiodarone  200 mg Oral Daily  . amLODipine  2.5 mg Oral Daily  . apixaban  2.5 mg Oral BID  . atorvastatin  40 mg Oral Daily  . calcium acetate  1,334 mg Oral TID with meals  . Chlorhexidine Gluconate Cloth  6 each Topical Q0600  . clopidogrel  75 mg Oral Daily  . gabapentin  100 mg Oral BID  . hydrOXYzine  25 mg Oral BID  . insulin aspart  0-6 Units Subcutaneous TID WC  . insulin aspart  2 Units Subcutaneous TID WC  . insulin glargine  29  Units Subcutaneous QHS  . isosorbide mononitrate  30 mg Oral Daily  . latanoprost  1 drop Both Eyes QHS  . levothyroxine  12.5 mcg Oral QAC breakfast  . LORazepam  1 mg Intramuscular Once  . metoprolol tartrate  50 mg Oral Daily  . pantoprazole  40 mg Oral Daily  . sodium bicarbonate  325 mg Oral BID  . Vitamin D (Ergocalciferol)  50,000 Units Oral Q30 days    acetaminophen, alum & mag hydroxide-simeth, melatonin, nitroGLYCERIN, ondansetron

## 2020-10-31 NOTE — Progress Notes (Addendum)
TOC CSW reviewed pts bed offers.  Pt has not had any bed offers since Friday (10/29/2020).  CSW will pursue bed offer search on Monday (11/01/2020).  Camar Guyton Tarpley-Carter, MSW, LCSW-A Pronouns:  She, Her, [place] ED Transitions of CareClinical Social Worker Goddess [email or web address] [phone]

## 2020-10-31 NOTE — Progress Notes (Addendum)
TOC CSW with the permission of pt, FL2 was faxed out to facilities for bed offers.   Truth Wolaver Tarpley-Carter, MSW, LCSW-A Pronouns:  She, Her, Hers                  [place] ED Transitions of CareClinical Social Worker [email or web address] [phone]
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
## 2020-10-31 NOTE — Progress Notes (Signed)
TOC CSW attempted to contact [facility] [phone], due to [facility] offered pt a bed. CSW left HIPPA compliant message with my contact information.  Kieran Nachtigal Tarpley-Carter, MSW, LCSW-A Pronouns:  She, Her, Hers                  [place] ED Transitions of CareClinical Social Worker [email or web address] [phone]

## 2020-10-31 NOTE — Progress Notes (Signed)
Pharmacy Brief Note  Pharmacy consulted for assistance with transitioning patient from [place] to [place]. Literature would suggest using a 50-65 % conversion factor (using 50-65% of the paricalcitol dose) to achieve similar PTH inhibition. Please see the following articles for reference.   Fadem SZ, Al-Saghir F, Mills Koller Converting hemodialysis patients from intravenous paricalcitol to intravenous doxercalciferol - a dose equivalency and titration study. Clin Nephrol. 2008 Oct;70(4):319-24. doi: W5629770. PMID: [id number].  Zisman AL, Ghantous W, [employer], Sprague SM. Inhibition of parathyroid hormone: a dose equivalency study of paricalcitol and doxercalciferol. Am J Nephrol. 2005 Nov-Dec;25(6):591-5. doi: 10.1159/000089707. Epub 2005 Nov 9. PMID: [id number].  Thank you for allowing pharmacy to participate in this patient's care.    Napoleon Form  10/31/20 6:41 PM

## 2020-10-31 NOTE — ED Provider Notes (Signed)
Emergency Medicine Observation Re-evaluation Note  Kyle Walton is a 65 y.o. male, seen on rounds today.  Pt initially presented to the ED for complaints of Suicidal, Aggressive Behavior, verbal abuse, Hyperglycemia, and Homicidal Currently, the patient is sleeping comfortably.  Physical Exam  BP 92/75 (BP Location: Right Arm)   Pulse 82   Temp (!) 97.3 F (36.3 C) (Axillary)   Resp 20   Ht '5\' 10"'$  (1.778 m)   Wt 121.7 kg   SpO2 99%   BMI 38.50 kg/m  Physical Exam General: sleeping comfortably    ED Course / MDM  EKG: I have reviewed the labs performed to date as well as medications administered while in observation.  Recent changes in the last 24 hours include none reported by staff. Last reported dialysis was on Friday (next due Monday).  Plan  Current plan is for SNF placement. Patient is not under full IVC at this time.   Valarie Merino, MD 10/31/20 [phone]

## 2020-10-31 NOTE — Progress Notes (Addendum)
TOC CSW received a call from [facility] [phone].  Clarene Critchley said she accepted pt thinking it was [facility] and it is not, therefore they do not accept pts insurance.  Dio Giller Tarpley-Carter, MSW, LCSW-A Pronouns:  She, Her, Hers                  [facility] ED Transitions of CareClinical Social Worker [email or web address] [phone]

## 2020-11-01 DIAGNOSIS — S0001XA Abrasion of scalp, initial encounter: Secondary | ICD-10-CM | POA: Diagnosis not present

## 2020-11-01 LAB — RENAL FUNCTION PANEL
Albumin: 3.1 g/dL — ABNORMAL LOW (ref 3.5–5.0)
Anion gap: 14 (ref 5–15)
BUN: 62 mg/dL — ABNORMAL HIGH (ref 8–23)
CO2: 22 mmol/L (ref 22–32)
Calcium: 8.6 mg/dL — ABNORMAL LOW (ref 8.9–10.3)
Chloride: 100 mmol/L (ref 98–111)
Creatinine, Ser: 7.6 mg/dL — ABNORMAL HIGH (ref 0.61–1.24)
GFR, Estimated: 7 mL/min — ABNORMAL LOW (ref >60)
Glucose, Bld: 191 mg/dL — ABNORMAL HIGH (ref 70–99)
Phosphorus: 5.8 mg/dL — ABNORMAL HIGH (ref 2.5–4.6)
Potassium: 4.8 mmol/L (ref 3.5–5.1)
Sodium: 136 mmol/L (ref 135–145)

## 2020-11-01 LAB — CBG MONITORING, ED
Glucose-Capillary: 158 mg/dL — ABNORMAL HIGH (ref 70–99)
Glucose-Capillary: 142 mg/dL — ABNORMAL HIGH (ref 70–99)
Glucose-Capillary: 209 mg/dL — ABNORMAL HIGH (ref 70–99)
Glucose-Capillary: 184 mg/dL — ABNORMAL HIGH (ref 70–99)

## 2020-11-01 LAB — CBC
HCT: 31 % — ABNORMAL LOW (ref 39.0–52.0)
Hemoglobin: 9.8 g/dL — ABNORMAL LOW (ref 13.0–17.0)
MCH: 30.3 pg (ref 26.0–34.0)
MCHC: 31.6 g/dL (ref 30.0–36.0)
MCV: 96 fL (ref 80.0–100.0)
Platelets: 140 10*3/uL — ABNORMAL LOW (ref 150–400)
RBC: 3.23 MIL/uL — ABNORMAL LOW (ref 4.22–5.81)
RDW: 18 % — ABNORMAL HIGH (ref 11.5–15.5)
WBC: 8.9 10*3/uL (ref 4.0–10.5)
nRBC: 0 % (ref 0.0–0.2)

## 2020-11-01 MED ORDER — HEPARIN SODIUM (PORCINE) 1000 UNIT/ML DIALYSIS
2000.0000 [IU] | INTRAMUSCULAR | Status: AC | PRN
  Administered 2020-11-01: 2000 [IU] via INTRAVENOUS_CENTRAL
  Administered 2020-11-01: 4200 [IU] via INTRAVENOUS_CENTRAL
  Filled 2020-11-01 (×3): qty 2

## 2020-11-01 MED ORDER — DARBEPOETIN ALFA 40 MCG/0.4ML IJ SOSY
PREFILLED_SYRINGE | INTRAMUSCULAR | Status: AC
  Filled 2020-11-01: qty 0.8

## 2020-11-01 MED ORDER — DIPHENHYDRAMINE HCL 50 MG/ML IJ SOLN
25.0000 mg | Freq: Once | INTRAMUSCULAR | Status: AC
  Administered 2020-11-01: 25 mg via INTRAVENOUS

## 2020-11-01 NOTE — Progress Notes (Addendum)
445 pm Sheriff Johnnye Sima was able to may contact with pt's wife, Delcie Roch, [phone]. States it was ok for pt to come home that she would be home. Updated address and contact number in pt's chart, [address] [place]. ED RN updated and PTAR will be arranged for pt dc home. Jonnie Finner RN CCM, [facility] ED TOC CM [phone]  418 pm TOC CM attempted call to pt's wife, Delcie Roch, sister, Verble Stoddart, son, [name] and brother, Jenny Reichmann. Left message for return call. TOC CM did not receive call back from family. Pt states the charges or restraining order was dropped in the court today. He can go back to his home. TOC CM contacted [place] office for a welfare check to pt's home to have pt's wife give hospital a call back about dc. Pt states he is ready to go home. [place], [place] ED TOC CM [phone]

## 2020-11-01 NOTE — ED Provider Notes (Signed)
Emergency Medicine Observation Re-evaluation Note  Kyle Walton is a 65 y.o. male, seen on rounds today.  Pt initially presented to the ED for complaints of Suicidal, Aggressive Behavior, verbal abuse, Hyperglycemia, and Homicidal Currently, the patient is recently returned from dialysis.  He was having some itching at dialysis and did receive Benadryl and is now sleepy.  Otherwise no complaints.  Physical Exam  BP 98/60 (BP Location: Right Wrist)   Pulse (!) 114   Temp 98.4 F (36.9 C) (Oral)   Resp 20   Ht '5\' 10"'$  (1.778 m)   Wt 121.7 kg   SpO2 94%   BMI 38.50 kg/m  Physical Exam General: No acute distress Cardiac: Mild tachycardia Lungs: No acute evidence of respiratory distress Psych: Cleared without any suicidal ideation at this time  ED Course / MDM  EKG:  I have reviewed the labs performed to date as well as medications administered while in observation.  Recent changes in the last 24 hours include based on note from yesterday restraining order may have been removed and patient may be able to go back home.  PT did eval and felt that he was unable to care for himself on his own.  Plan  Current plan is for following for possible skilled placement versus home with his wife.  On sure due to social factors. Patient is not under full IVC at this time.   Blanchie Dessert, MD 11/01/20 1257

## 2020-11-01 NOTE — ED Notes (Signed)
Patient has been calm and cooperative. Seems more positive that he may be going home today. He did speak with his wife last night and that seemed to go well.

## 2020-11-01 NOTE — ED Notes (Addendum)
This RN called PTAR waiting status on this patient, PTAR said there are still 5 patients ahead of him. Patient is tired of waiting and want to leave by cab.  Educate patient on his mobility safety that it would be safer to be transport home by [facility] but patient refused and wants a cab volcher. AC Joe explained to patient unless he has a way to pay for it then he can leave. Patient called his wife to arrange a cab to pick him up. The cab will be here in approximately in 15 minutes.

## 2020-11-01 NOTE — Progress Notes (Signed)
[facility] Progress Note  Subjective: seen in [facility] HD.  C/o pruritis with HD requesting benadryl.  No other complaints.  Vitals:   10/31/20 1707 11/01/20 0100 11/01/20 0743 11/01/20 0841  BP: 123/73 113/66 104/65   Pulse: 87 77 78   Resp: '18 20 18   '$ Temp: 97.9 F (36.6 C) 98.4 F (36.9 C) 97.9 F (36.6 C) 98.3 F (36.8 C)  TempSrc: Oral Axillary Oral Oral  SpO2: 96% 95% 94%   Weight:      Height:        Exam:  alert, nad   no jvd  Chest cta bilat  Cor reg no RG  Abd soft ntnd obese   Ext 1+ tibial edema   Alert, NF, ox3  LIJ TDC in place c/d/i Qb 350   Home meds:  - amiodarone 200 qd/ norvasc 2.5 qd/ lipitor 40/ plavix 75/ imdur 30 qd/ eliquis 2.5 bid/ metoprolol 25 bid/ sl ntg prn  - sod bicarb 325 bid/ phoslo 2 ac tid/ protonix 40/ synthroid 12.5 ug qd  - insulin lispro SSI 2- 12ug ac tid/ glargine 40u hs  - neurontin 100 bid  - prn's/ vitamins/ supplements    OP HD: [facility]  MWF  4h  450/800  117kg  2K/3Ca bath   TDC   Hep 4900 + 500u/hr  - zemplar 3.5 ug tiw  - aranesp 85 mg q wed     CXR 4/19 - poor film, IMPRESSION > Low volume film with cardiomegaly and vascular congestion.     BS's 150 - 200 range    Assessment/ Plan: 1. Suicidal ideation - was IVC'd initially but no longer in place.  2. Debility - per PT needs SNF placement. Pt states he might be going home and per notes it appears may even be today.   3. ESRD - gets HD per [facility] in [facility], MWF. Started HD in 2017. In [facility] ED and is being transported by ambulance back and forth to/ from [facility] for regular dialysis. Get labs MWF. Would consider transferring pt to the [facility] ED.  4. HTN/ vol - BP's are soft, have dc'd low dose norvasc. 7.9 kg over EDW per standing wt today and with mild LE edema.  With BP on lower side will run low temp, profile 2 and attempt 4L UF today. Have split metoprolol to 25 bid w/ hold orders.  5. MBD ckd - cont phoslo, vdra per pharm 6. Anemia ckd - last  Hb 9.8. Cont darbe 75 weekly.  May need dose increased.  7. Atrial fib - per pmd, on home eliquis 2.5 bid and home amio/ metoprolol 8. CAD hx CABG/ PCI - on home imdur, plavix, metoprolol  Jannifer Hick MD [facility] Pager [phone]    Recent Labs  Lab 10/26/20 1452 11/01/20 0500  K 4.4 4.8  BUN 37* 62*  CREATININE 6.14* 7.60*  CALCIUM 8.7* 8.6*  PHOS  --  5.8*  HGB 10.5* 9.8*   Inpatient medications: . amiodarone  200 mg Oral Daily  . amLODipine  2.5 mg Oral Daily  . apixaban  2.5 mg Oral BID  . atorvastatin  40 mg Oral Daily  . calcium acetate  1,334 mg Oral TID with meals  . Chlorhexidine Gluconate Cloth  6 each Topical Q0600  . clopidogrel  75 mg Oral Daily  . darbepoetin (ARANESP) injection - DIALYSIS  80 mcg Intravenous Q Mon-HD  . gabapentin  100 mg Oral BID  .  hydrOXYzine  25 mg Oral BID  . insulin aspart  0-6 Units Subcutaneous TID WC  . insulin aspart  2 Units Subcutaneous TID WC  . insulin glargine  29 Units Subcutaneous QHS  . isosorbide mononitrate  30 mg Oral Daily  . latanoprost  1 drop Both Eyes QHS  . levothyroxine  12.5 mcg Oral QAC breakfast  . LORazepam  1 mg Intramuscular Once  . metoprolol tartrate  25 mg Oral BID  . pantoprazole  40 mg Oral Daily  . sodium bicarbonate  325 mg Oral BID  . Vitamin D (Ergocalciferol)  50,000 Units Oral Q30 days    acetaminophen, alum & mag hydroxide-simeth, [START ON 11/02/2020] heparin, melatonin, nitroGLYCERIN, ondansetron

## 2020-11-02 NOTE — Progress Notes (Signed)
Chaplain responded to call from nurse that patient needed to talk with a Chaplain. Patient was in hematology having dialysis. Chaplain sat bedside and talked with patient. He wanted to go home. He talked about the last few weeks and why he is in the hospital. He also asked Chaplain to find out if wife dropped charges against him this morning in court. Chaplain explained that she could not find out that kind of information and his case worker would be the person to speak with. He also asked Chaplain if she could enroll him in an anger management course close to his home in [place]. Chaplain explained that his case worker would also be able to help him with that as she could have the tools to find anger management classes. Chaplain listened to patient lament over his behavior and offered prayer and support. Chaplain is available when needed.    11/01/20 1030  Clinical Encounter Type  Visited With Patient  Visit Type Spiritual support;Other (Comment)  Referral From Nurse  Consult/Referral To Chaplain  Spiritual Encounters  Spiritual Needs Prayer;Emotional  Stress Factors  Patient Stress Factors Family relationships;Health changes

## 2020-12-03 ENCOUNTER — Emergency Department (HOSPITAL_COMMUNITY): Payer: Medicare HMO

## 2020-12-03 ENCOUNTER — Inpatient Hospital Stay (HOSPITAL_COMMUNITY)
Admission: EM | Admit: 2020-12-03 | Discharge: 2020-12-31 | DRG: 871 | Disposition: A | Discharge status: Skilled Nursing Facility | Payer: Medicare HMO | Attending: Internal Medicine | Admitting: Internal Medicine

## 2020-12-03 ENCOUNTER — Encounter (HOSPITAL_COMMUNITY): Payer: Self-pay | Admitting: *Deleted

## 2020-12-03 ENCOUNTER — Other Ambulatory Visit: Payer: Self-pay

## 2020-12-03 DIAGNOSIS — Z951 Presence of aortocoronary bypass graft: Secondary | ICD-10-CM

## 2020-12-03 DIAGNOSIS — Z6841 Body Mass Index (BMI) 40.0 and over, adult: Secondary | ICD-10-CM | POA: Diagnosis not present

## 2020-12-03 DIAGNOSIS — E1122 Type 2 diabetes mellitus with diabetic chronic kidney disease: Secondary | ICD-10-CM | POA: Diagnosis present

## 2020-12-03 DIAGNOSIS — A419 Sepsis, unspecified organism: Secondary | ICD-10-CM | POA: Diagnosis not present

## 2020-12-03 DIAGNOSIS — I4892 Unspecified atrial flutter: Secondary | ICD-10-CM | POA: Diagnosis not present

## 2020-12-03 DIAGNOSIS — Z20822 Contact with and (suspected) exposure to covid-19: Secondary | ICD-10-CM | POA: Diagnosis not present

## 2020-12-03 DIAGNOSIS — L8990 Pressure ulcer of unspecified site, unspecified stage: Secondary | ICD-10-CM | POA: Insufficient documentation

## 2020-12-03 DIAGNOSIS — N2581 Secondary hyperparathyroidism of renal origin: Secondary | ICD-10-CM | POA: Diagnosis present

## 2020-12-03 DIAGNOSIS — I5042 Chronic combined systolic (congestive) and diastolic (congestive) heart failure: Secondary | ICD-10-CM | POA: Diagnosis present

## 2020-12-03 DIAGNOSIS — G5711 Meralgia paresthetica, right lower limb: Secondary | ICD-10-CM

## 2020-12-03 DIAGNOSIS — L89152 Pressure ulcer of sacral region, stage 2: Secondary | ICD-10-CM | POA: Diagnosis not present

## 2020-12-03 DIAGNOSIS — I120 Hypertensive chronic kidney disease with stage 5 chronic kidney disease or end stage renal disease: Secondary | ICD-10-CM | POA: Diagnosis present

## 2020-12-03 DIAGNOSIS — R29898 Other symptoms and signs involving the musculoskeletal system: Secondary | ICD-10-CM | POA: Diagnosis not present

## 2020-12-03 DIAGNOSIS — N185 Chronic kidney disease, stage 5: Secondary | ICD-10-CM

## 2020-12-03 DIAGNOSIS — I252 Old myocardial infarction: Secondary | ICD-10-CM

## 2020-12-03 DIAGNOSIS — G928 Other toxic encephalopathy: Secondary | ICD-10-CM | POA: Diagnosis present

## 2020-12-03 DIAGNOSIS — Z79890 Hormone replacement therapy (postmenopausal): Secondary | ICD-10-CM

## 2020-12-03 DIAGNOSIS — D72825 Bandemia: Secondary | ICD-10-CM | POA: Diagnosis not present

## 2020-12-03 DIAGNOSIS — M7989 Other specified soft tissue disorders: Secondary | ICD-10-CM | POA: Diagnosis not present

## 2020-12-03 DIAGNOSIS — E1151 Type 2 diabetes mellitus with diabetic peripheral angiopathy without gangrene: Secondary | ICD-10-CM | POA: Diagnosis present

## 2020-12-03 DIAGNOSIS — I953 Hypotension of hemodialysis: Secondary | ICD-10-CM | POA: Diagnosis not present

## 2020-12-03 DIAGNOSIS — D631 Anemia in chronic kidney disease: Secondary | ICD-10-CM | POA: Diagnosis not present

## 2020-12-03 DIAGNOSIS — R21 Rash and other nonspecific skin eruption: Secondary | ICD-10-CM | POA: Diagnosis present

## 2020-12-03 DIAGNOSIS — E1165 Type 2 diabetes mellitus with hyperglycemia: Secondary | ICD-10-CM | POA: Diagnosis present

## 2020-12-03 DIAGNOSIS — Z794 Long term (current) use of insulin: Secondary | ICD-10-CM

## 2020-12-03 DIAGNOSIS — I255 Ischemic cardiomyopathy: Secondary | ICD-10-CM | POA: Diagnosis present

## 2020-12-03 DIAGNOSIS — Z7901 Long term (current) use of anticoagulants: Secondary | ICD-10-CM | POA: Diagnosis not present

## 2020-12-03 DIAGNOSIS — Z95810 Presence of automatic (implantable) cardiac defibrillator: Secondary | ICD-10-CM

## 2020-12-03 DIAGNOSIS — I480 Paroxysmal atrial fibrillation: Secondary | ICD-10-CM | POA: Diagnosis not present

## 2020-12-03 DIAGNOSIS — G4733 Obstructive sleep apnea (adult) (pediatric): Secondary | ICD-10-CM | POA: Diagnosis present

## 2020-12-03 DIAGNOSIS — Z79899 Other long term (current) drug therapy: Secondary | ICD-10-CM

## 2020-12-03 DIAGNOSIS — Z89421 Acquired absence of other right toe(s): Secondary | ICD-10-CM

## 2020-12-03 DIAGNOSIS — L03115 Cellulitis of right lower limb: Secondary | ICD-10-CM | POA: Diagnosis present

## 2020-12-03 DIAGNOSIS — R5381 Other malaise: Secondary | ICD-10-CM | POA: Diagnosis not present

## 2020-12-03 DIAGNOSIS — I132 Hypertensive heart and chronic kidney disease with heart failure and with stage 5 chronic kidney disease, or end stage renal disease: Secondary | ICD-10-CM | POA: Diagnosis not present

## 2020-12-03 DIAGNOSIS — I5032 Chronic diastolic (congestive) heart failure: Secondary | ICD-10-CM | POA: Diagnosis not present

## 2020-12-03 DIAGNOSIS — Z992 Dependence on renal dialysis: Secondary | ICD-10-CM

## 2020-12-03 DIAGNOSIS — L538 Other specified erythematous conditions: Secondary | ICD-10-CM | POA: Diagnosis not present

## 2020-12-03 DIAGNOSIS — K5900 Constipation, unspecified: Secondary | ICD-10-CM | POA: Diagnosis present

## 2020-12-03 DIAGNOSIS — Z7902 Long term (current) use of antithrombotics/antiplatelets: Secondary | ICD-10-CM

## 2020-12-03 DIAGNOSIS — E039 Hypothyroidism, unspecified: Secondary | ICD-10-CM | POA: Diagnosis present

## 2020-12-03 DIAGNOSIS — N186 End stage renal disease: Secondary | ICD-10-CM | POA: Diagnosis not present

## 2020-12-03 DIAGNOSIS — I10 Essential (primary) hypertension: Secondary | ICD-10-CM | POA: Diagnosis not present

## 2020-12-03 DIAGNOSIS — I2510 Atherosclerotic heart disease of native coronary artery without angina pectoris: Secondary | ICD-10-CM | POA: Diagnosis present

## 2020-12-03 DIAGNOSIS — R6520 Severe sepsis without septic shock: Secondary | ICD-10-CM | POA: Diagnosis present

## 2020-12-03 DIAGNOSIS — R0602 Shortness of breath: Secondary | ICD-10-CM

## 2020-12-03 DIAGNOSIS — L8992 Pressure ulcer of unspecified site, stage 2: Secondary | ICD-10-CM | POA: Diagnosis not present

## 2020-12-03 DIAGNOSIS — Z89422 Acquired absence of other left toe(s): Secondary | ICD-10-CM

## 2020-12-03 DIAGNOSIS — H548 Legal blindness, as defined in USA: Secondary | ICD-10-CM | POA: Diagnosis present

## 2020-12-03 DIAGNOSIS — I951 Orthostatic hypotension: Secondary | ICD-10-CM | POA: Diagnosis not present

## 2020-12-03 HISTORY — DX: Type 2 diabetes mellitus without complications: E11.9

## 2020-12-03 HISTORY — DX: End stage renal disease: N18.6

## 2020-12-03 HISTORY — DX: Acute myocardial infarction, unspecified: I21.9

## 2020-12-03 HISTORY — DX: Disorder of kidney and ureter, unspecified: N28.9

## 2020-12-03 HISTORY — DX: Unspecified diastolic (congestive) heart failure: I50.30

## 2020-12-03 HISTORY — DX: Paroxysmal atrial fibrillation: I48.0

## 2020-12-03 HISTORY — DX: Obstructive sleep apnea (adult) (pediatric): G47.33

## 2020-12-03 LAB — COMPREHENSIVE METABOLIC PANEL
ALT: 12 U/L (ref 0–44)
AST: 38 U/L (ref 15–41)
Albumin: 2.9 g/dL — ABNORMAL LOW (ref 3.5–5.0)
Alkaline Phosphatase: 77 U/L (ref 38–126)
Anion gap: 17 — ABNORMAL HIGH (ref 5–15)
BUN: 57 mg/dL — ABNORMAL HIGH (ref 8–23)
CO2: 19 mmol/L — ABNORMAL LOW (ref 22–32)
Calcium: 8.3 mg/dL — ABNORMAL LOW (ref 8.9–10.3)
Chloride: 99 mmol/L (ref 98–111)
Creatinine, Ser: 9.94 mg/dL — ABNORMAL HIGH (ref 0.61–1.24)
GFR, Estimated: 5 mL/min — ABNORMAL LOW (ref >60)
Glucose, Bld: 278 mg/dL — ABNORMAL HIGH (ref 70–99)
Potassium: 4.6 mmol/L (ref 3.5–5.1)
Sodium: 135 mmol/L (ref 135–145)
Total Bilirubin: 1.3 mg/dL — ABNORMAL HIGH (ref 0.3–1.2)
Total Protein: 6.8 g/dL (ref 6.5–8.1)

## 2020-12-03 LAB — LACTIC ACID, PLASMA
Lactic Acid, Venous: 1.8 mmol/L (ref 0.5–1.9)
Lactic Acid, Venous: 1.9 mmol/L (ref 0.5–1.9)

## 2020-12-03 LAB — CBC WITH DIFFERENTIAL/PLATELET
Abs Immature Granulocytes: 0.2 10*3/uL — ABNORMAL HIGH (ref 0.00–0.07)
Basophils Absolute: 0 10*3/uL (ref 0.0–0.1)
Basophils Relative: 0 %
Eosinophils Absolute: 0 10*3/uL (ref 0.0–0.5)
Eosinophils Relative: 0 %
HCT: 33.9 % — ABNORMAL LOW (ref 39.0–52.0)
Hemoglobin: 10.9 g/dL — ABNORMAL LOW (ref 13.0–17.0)
Immature Granulocytes: 1 %
Lymphocytes Relative: 4 %
Lymphs Abs: 0.7 10*3/uL (ref 0.7–4.0)
MCH: 30.5 pg (ref 26.0–34.0)
MCHC: 32.2 g/dL (ref 30.0–36.0)
MCV: 95 fL (ref 80.0–100.0)
Monocytes Absolute: 1.6 10*3/uL — ABNORMAL HIGH (ref 0.1–1.0)
Monocytes Relative: 9 %
Neutro Abs: 15.8 10*3/uL — ABNORMAL HIGH (ref 1.7–7.7)
Neutrophils Relative %: 86 %
Platelets: 148 10*3/uL — ABNORMAL LOW (ref 150–400)
RBC: 3.57 MIL/uL — ABNORMAL LOW (ref 4.22–5.81)
RDW: 17 % — ABNORMAL HIGH (ref 11.5–15.5)
WBC: 18.3 10*3/uL — ABNORMAL HIGH (ref 4.0–10.5)
nRBC: 0 % (ref 0.0–0.2)

## 2020-12-03 LAB — PROTIME-INR
INR: 1.6 — ABNORMAL HIGH (ref 0.8–1.2)
Prothrombin Time: 18.9 seconds — ABNORMAL HIGH (ref 11.4–15.2)

## 2020-12-03 LAB — RESP PANEL BY RT-PCR (FLU A&B, COVID) ARPGX2
Influenza A by PCR: NEGATIVE
Influenza B by PCR: NEGATIVE
SARS Coronavirus 2 by RT PCR: NEGATIVE

## 2020-12-03 IMAGING — DX DG HIP (WITH OR WITHOUT PELVIS) 2-3V*R*
3 series · 3 of 3 positions shown · non-contrast
Comparison: None.

CLINICAL DATA: Right leg pain.

EXAM:
DG HIP (WITH OR WITHOUT PELVIS) 2-3V RIGHT

[pelvis ap]
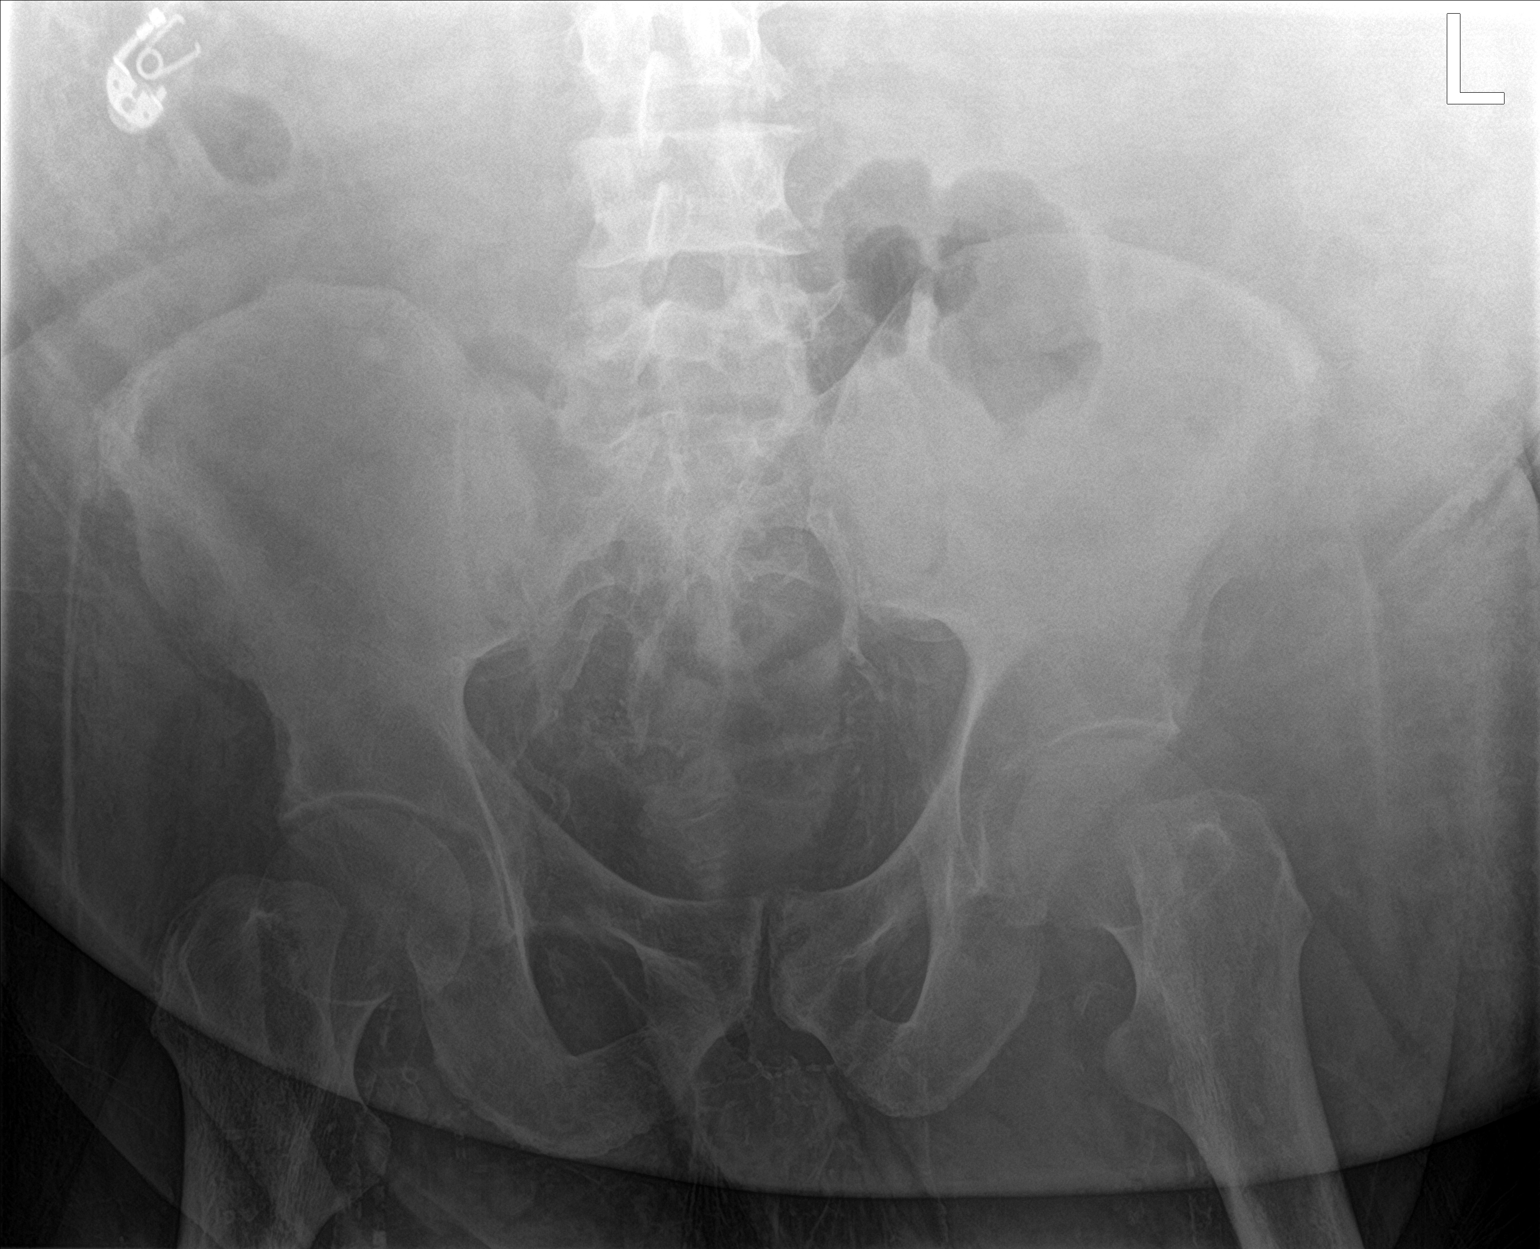

[hip ap]
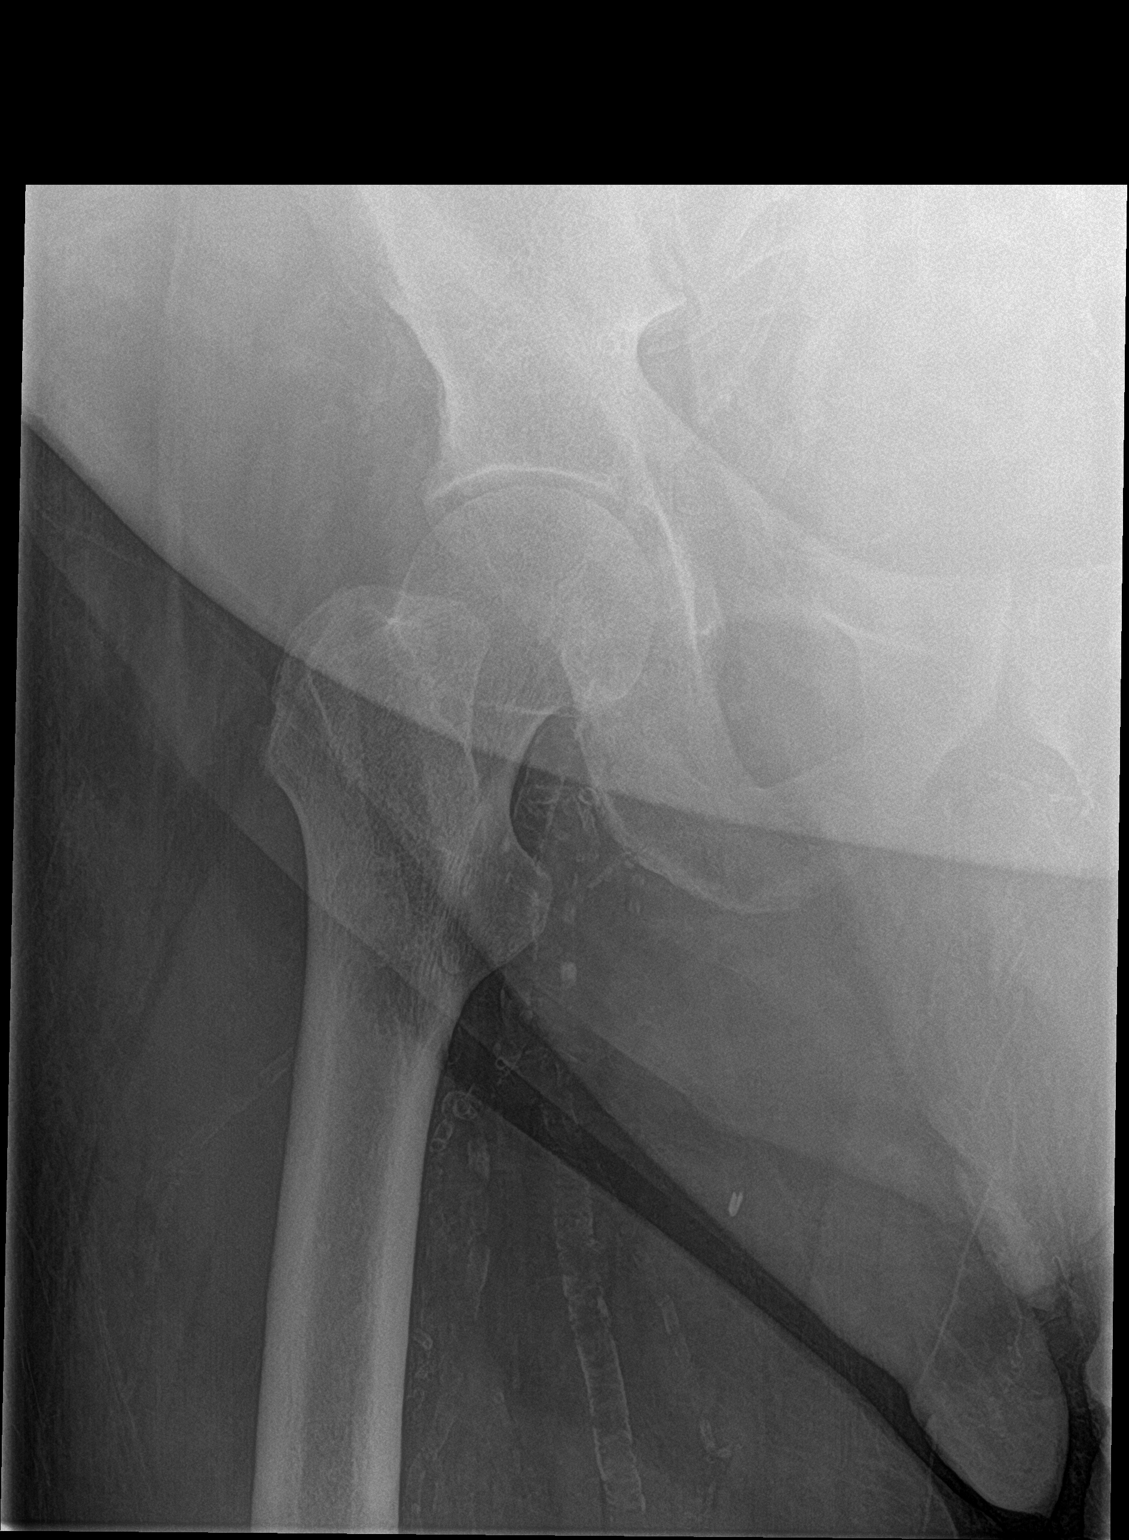

[hip lat]
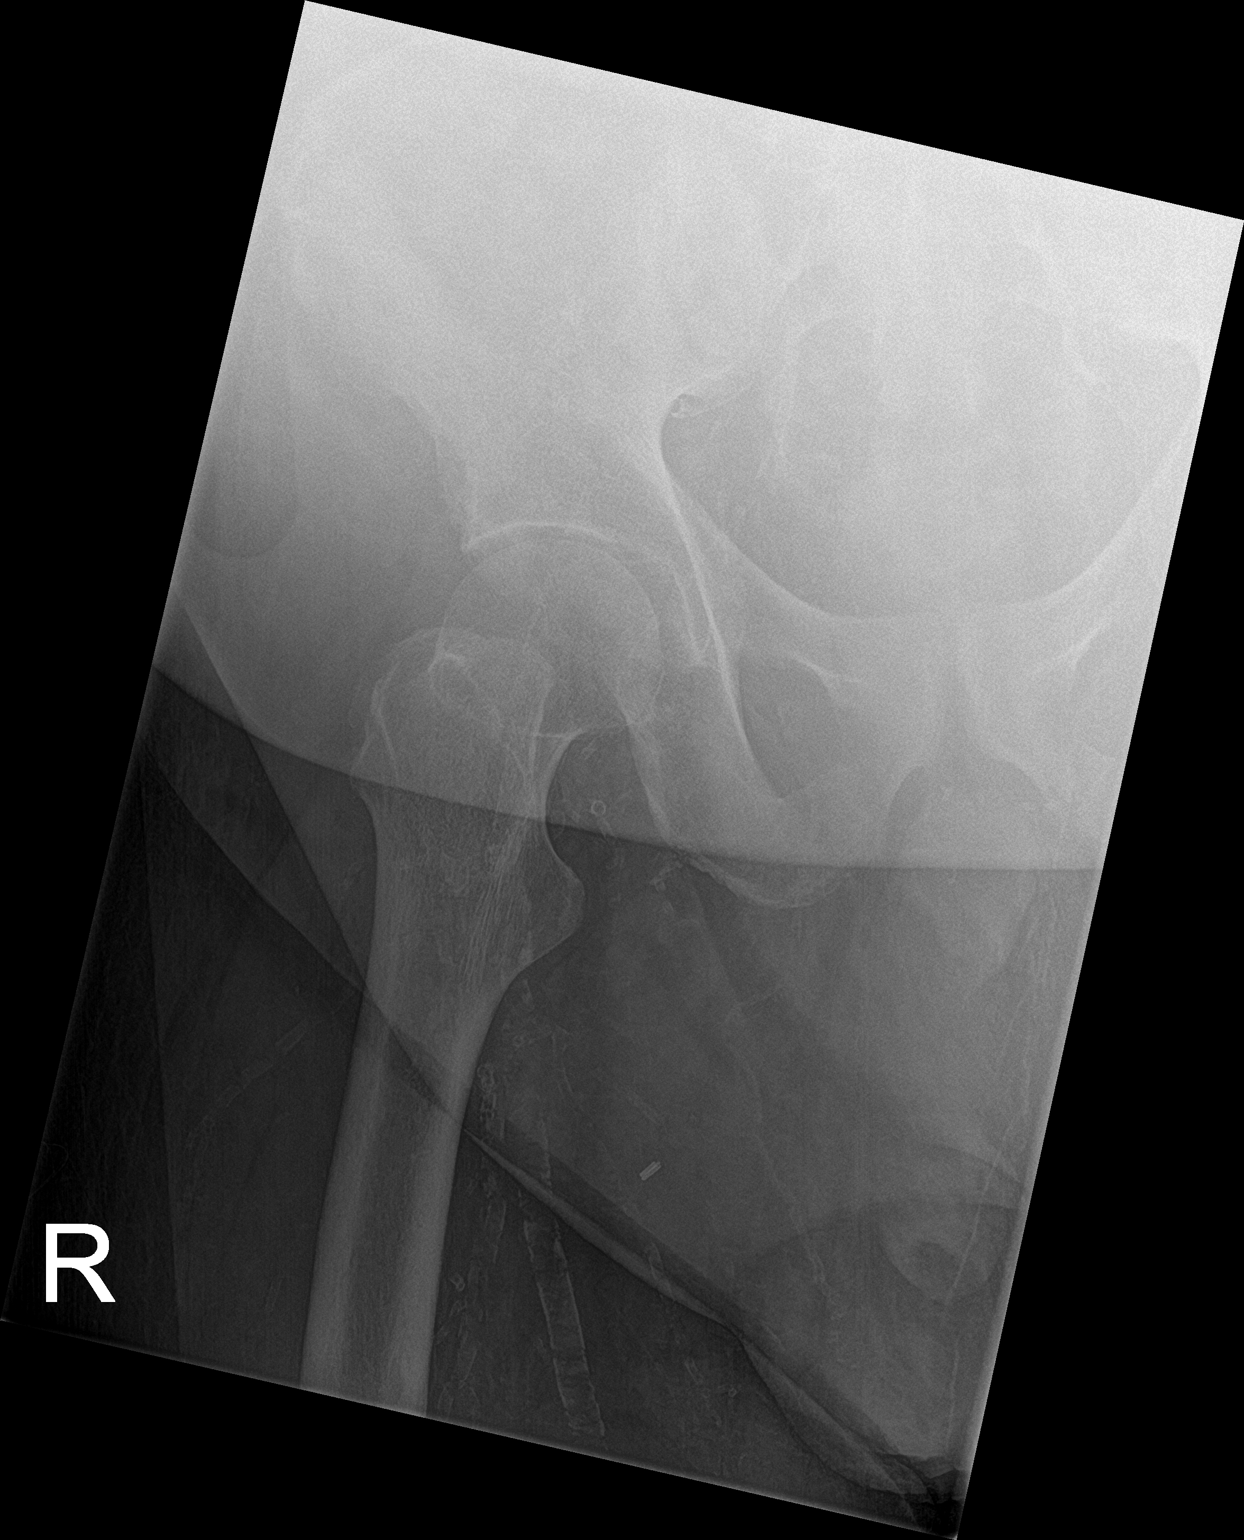

[3 of 3 positions shown; findings below may reference images not displayed]

FINDINGS: There is no evidence of hip fracture or dislocation. There is no
evidence of arthropathy or other focal bone abnormality.
IMPRESSION: Negative.

## 2020-12-03 IMAGING — DX DG CHEST 1V
1 series · 2 of 2 positions shown · non-contrast
Comparison: [DATE]

CLINICAL DATA: Leg pain

Suspected sepsis
EXAM:
CHEST  1 VIEW

[Series 3: chest ap · 0.14mm/px · 2 of 2 slices shown]
[im 1/2]
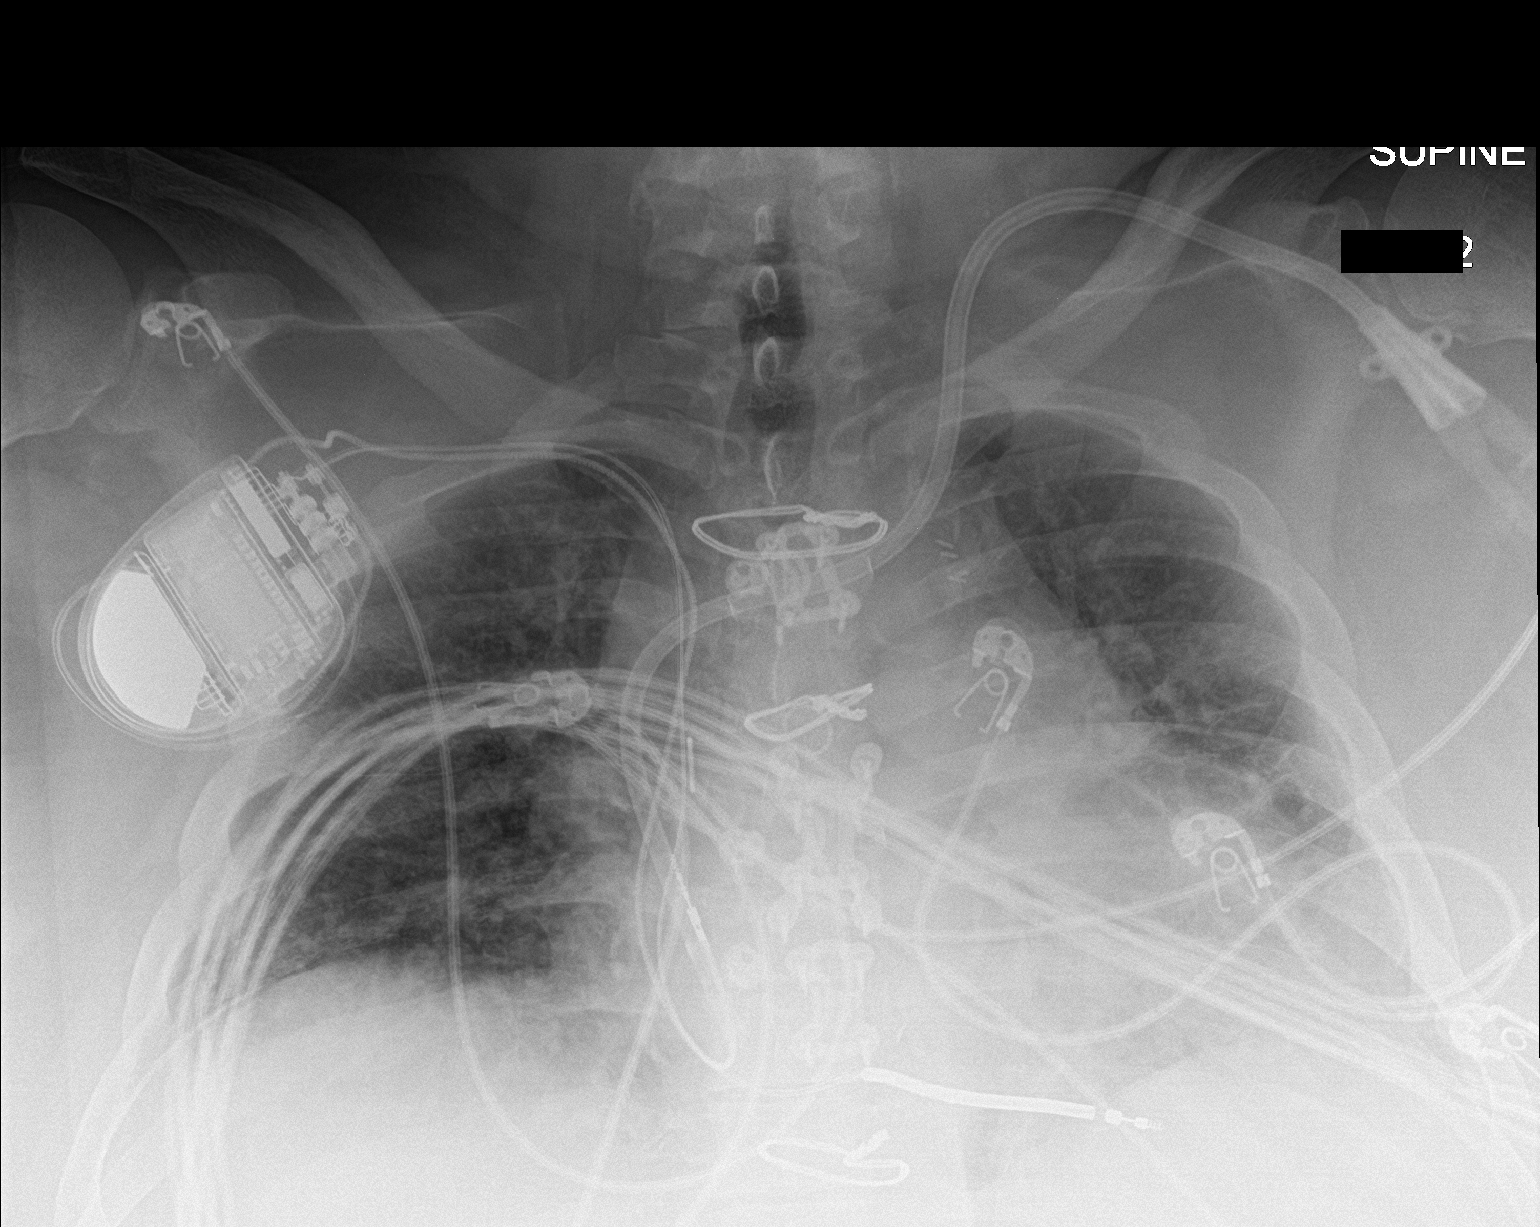
[im 2/2]
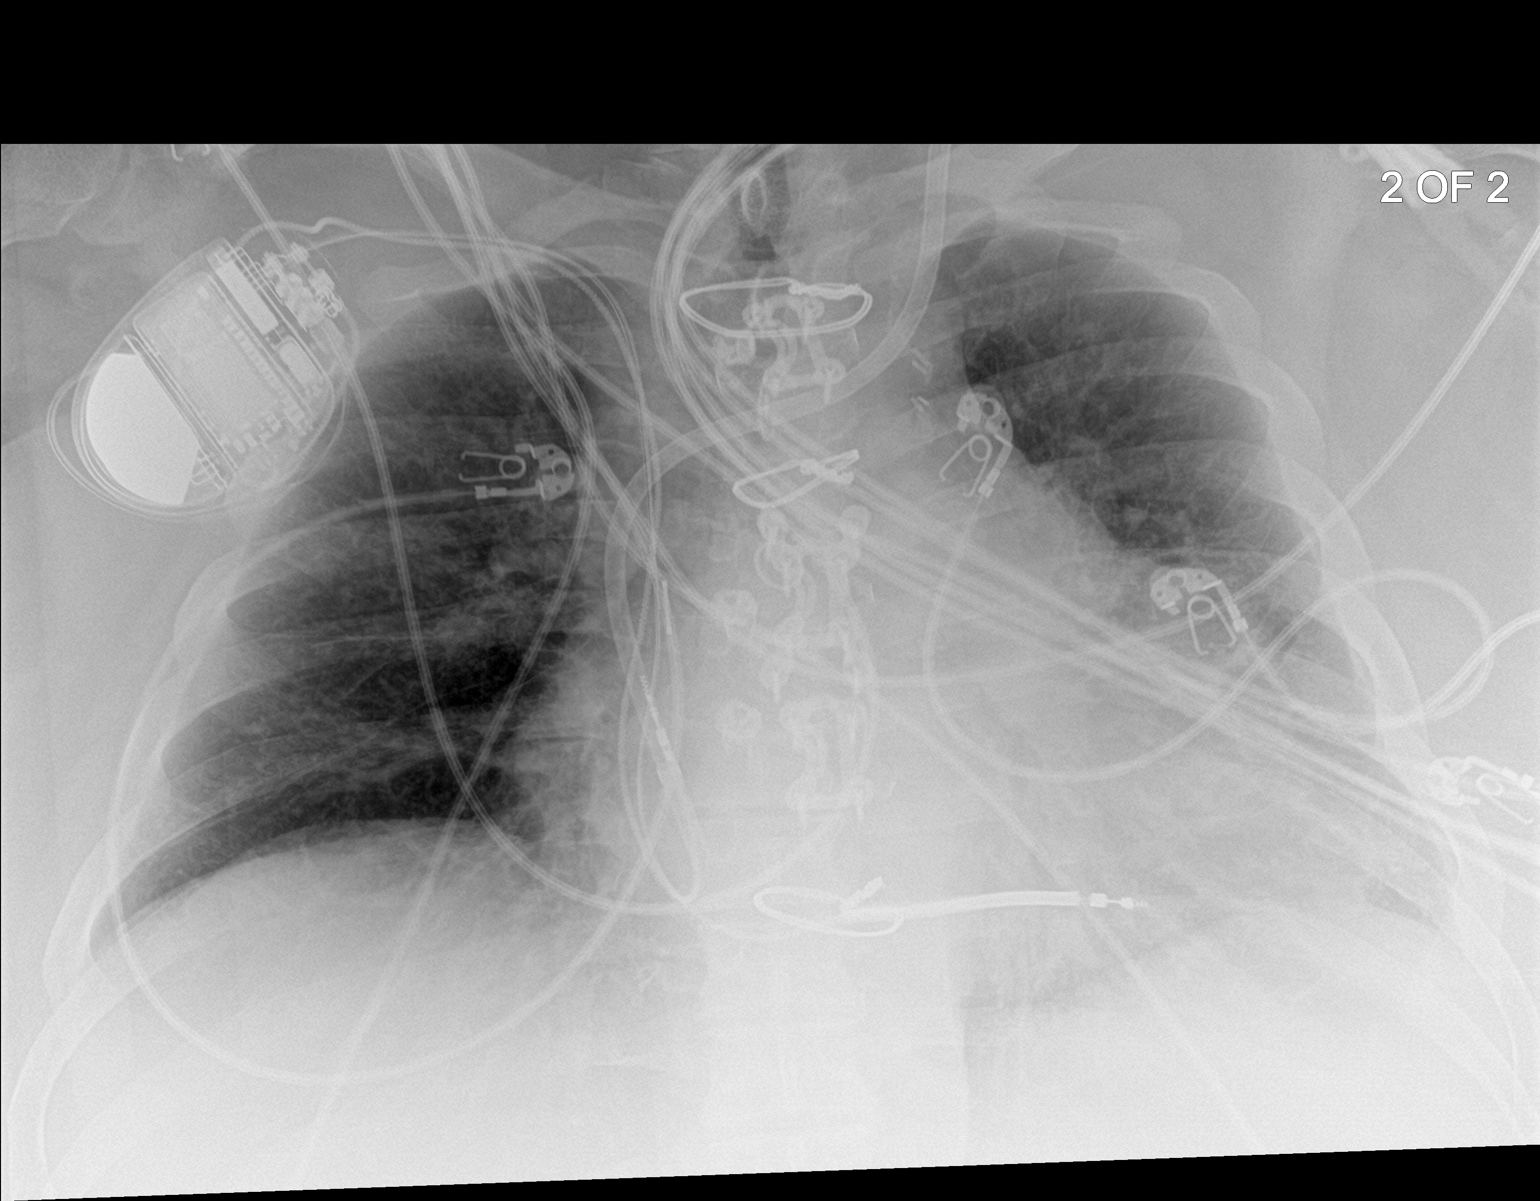

[2 of 2 positions shown; findings below may reference images not displayed]

FINDINGS: Evaluation of the chest limited due to expiratory phase of imaging.
Unchanged cardiomegaly. Mild pulmonary vascular congestion is
unchanged. Left IJ dialysis catheter terminates in the right atrium,
unchanged from prior exam. Right chest wall AICD unchanged in
configuration. Sternal fixation wires and hardware again seen. Lungs
are clear.
IMPRESSION: Unchanged cardiomegaly and pulmonary vascular congestion.

## 2020-12-03 IMAGING — DX DG TIBIA/FIBULA 2V*R*
3 series · 3 of 3 positions shown · non-contrast
Comparison: None.

CLINICAL DATA: Right leg pain.

EXAM:
RIGHT TIBIA AND FIBULA - 2 VIEW

[tibia ap (1 of 2)]
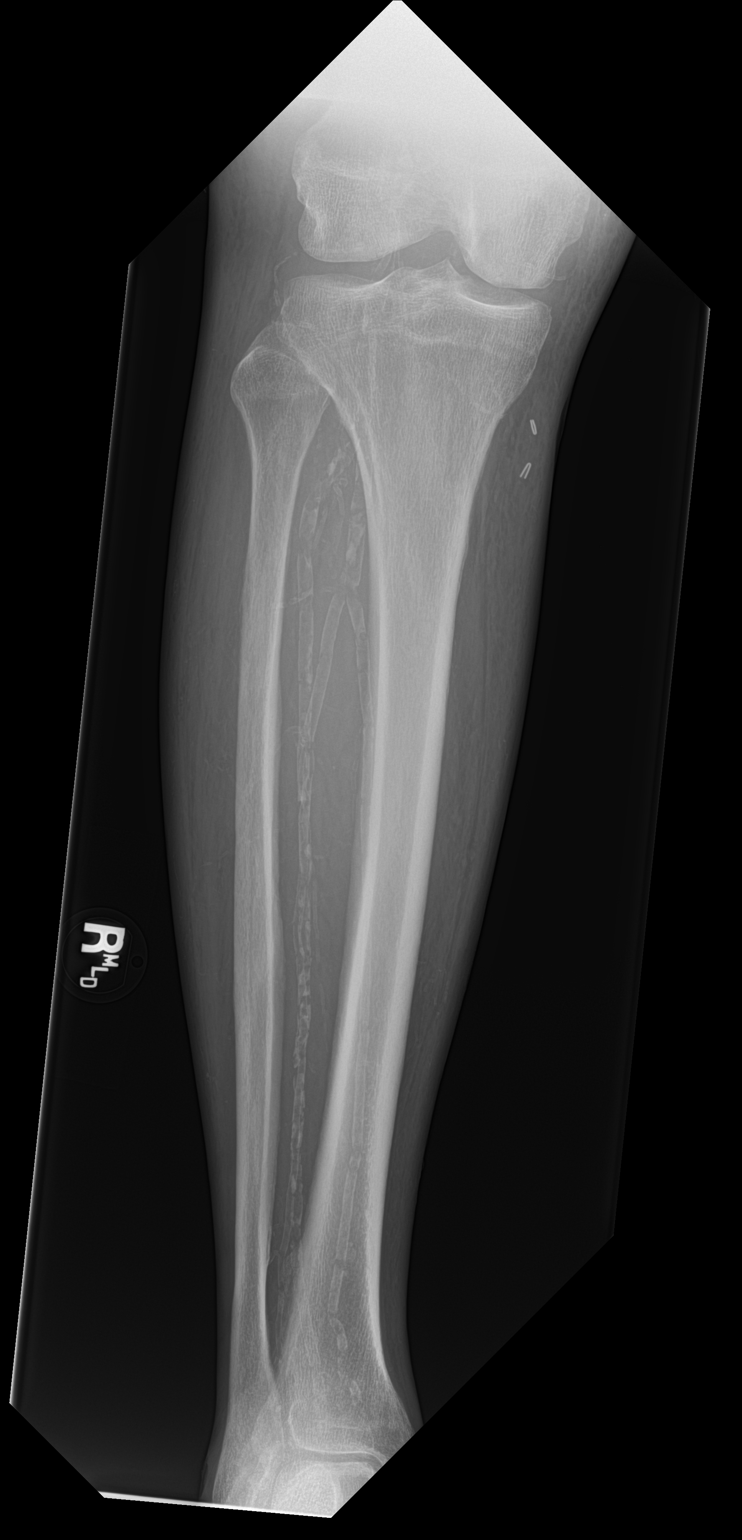

[tibia ap (2 of 2)]
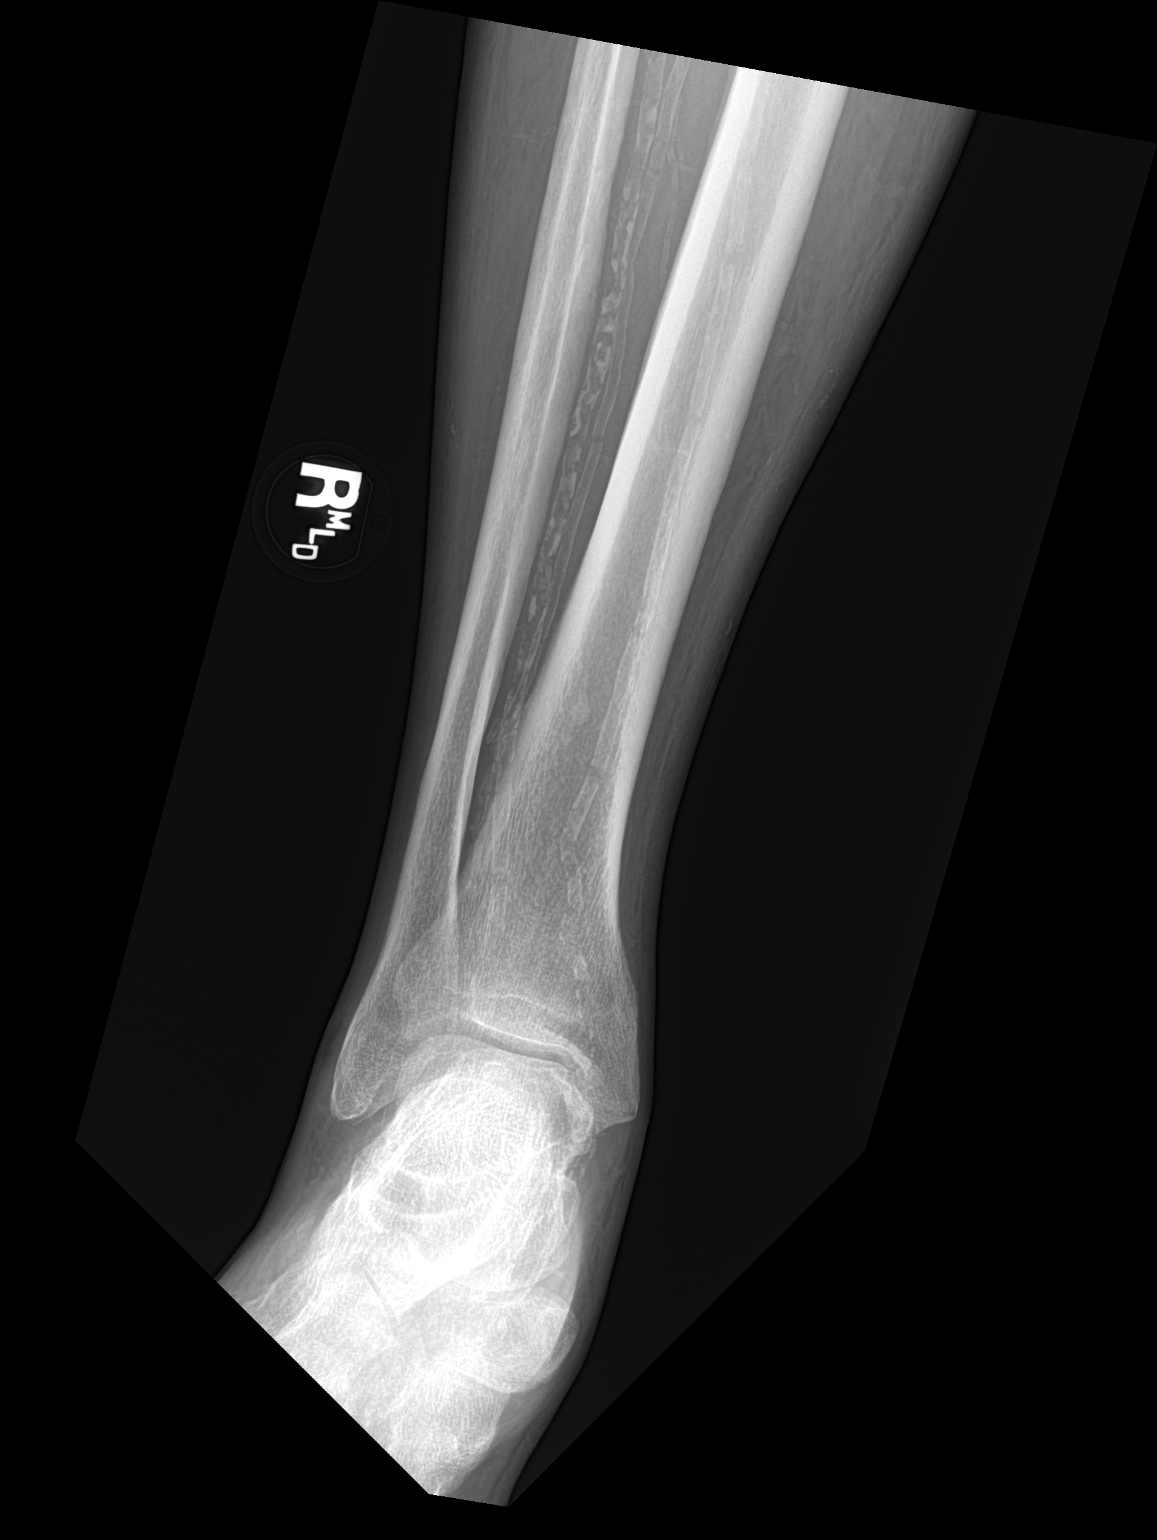

[tibia lat]
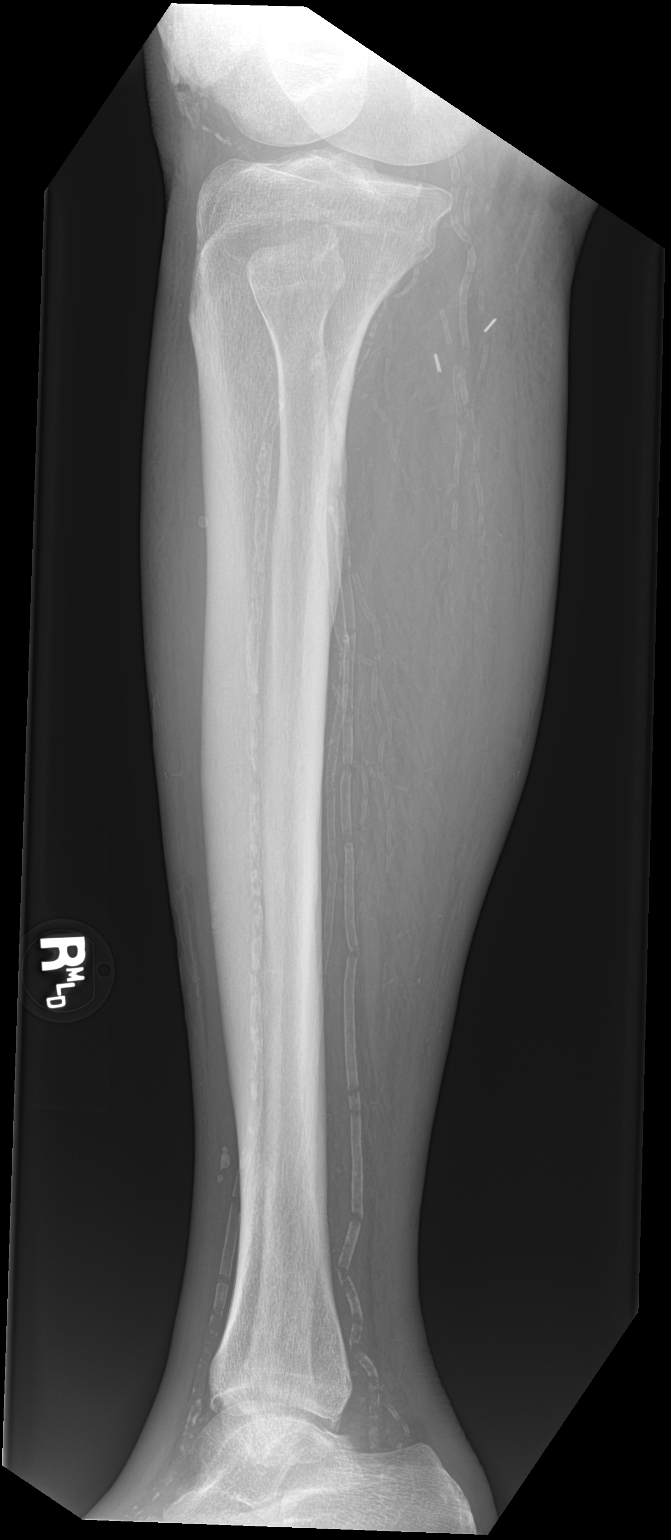

[3 of 3 positions shown; findings below may reference images not displayed]

FINDINGS: There is no evidence of fracture or other focal bone lesions.
Vascular calcifications are noted.
IMPRESSION: No acute abnormality is noted.

## 2020-12-03 MED ORDER — HYDROMORPHONE HCL 1 MG/ML IJ SOLN
1.0000 mg | Freq: Once | INTRAMUSCULAR | Status: AC
  Administered 2020-12-03: 1 mg via INTRAVENOUS
  Filled 2020-12-03: qty 1

## 2020-12-03 MED ORDER — SODIUM CHLORIDE 0.9 % IV BOLUS
250.0000 mL | Freq: Once | INTRAVENOUS | Status: AC
  Administered 2020-12-03: 250 mL via INTRAVENOUS

## 2020-12-03 MED ORDER — SODIUM CHLORIDE 0.45 % IV SOLN: INTRAVENOUS | Status: AC

## 2020-12-03 MED ORDER — SODIUM CHLORIDE 0.9 % IV SOLN
2.0000 g | Freq: Once | INTRAVENOUS | Status: AC
  Administered 2020-12-03: 2 g via INTRAVENOUS
  Filled 2020-12-03: qty 20

## 2020-12-03 MED ORDER — AMLODIPINE BESYLATE 5 MG PO TABS
2.5000 mg | ORAL_TABLET | Freq: Every day | ORAL | Status: DC
  Filled 2020-12-03 (×2): qty 1

## 2020-12-03 MED ORDER — LACTATED RINGERS IV SOLN: INTRAVENOUS | Status: DC

## 2020-12-03 MED ORDER — HYDROCODONE-ACETAMINOPHEN 5-325 MG PO TABS
1.0000 | ORAL_TABLET | Freq: Four times a day (QID) | ORAL | Status: DC | PRN
Start: 2020-12-03 — End: 2020-12-05
  Administered 2020-12-04 – 2020-12-05 (×2): 1 via ORAL
  Filled 2020-12-03 (×2): qty 1

## 2020-12-03 MED ORDER — CLOPIDOGREL BISULFATE 75 MG PO TABS
75.0000 mg | ORAL_TABLET | Freq: Every day | ORAL | Status: DC
  Administered 2020-12-04 – 2020-12-31 (×29): 75 mg via ORAL
  Filled 2020-12-03 (×29): qty 1

## 2020-12-03 MED ORDER — FUROSEMIDE 10 MG/ML IJ SOLN
40.0000 mg | Freq: Once | INTRAMUSCULAR | Status: AC
  Administered 2020-12-03: 40 mg via INTRAVENOUS
  Filled 2020-12-03: qty 4

## 2020-12-03 MED ORDER — PANTOPRAZOLE SODIUM 40 MG PO TBEC
40.0000 mg | DELAYED_RELEASE_TABLET | Freq: Every day | ORAL | Status: DC
  Administered 2020-12-04 – 2020-12-20 (×18): 40 mg via ORAL
  Filled 2020-12-03 (×18): qty 1

## 2020-12-03 MED ORDER — VANCOMYCIN HCL 1000 MG/200ML IV SOLN
1000.0000 mg | Freq: Once | INTRAVENOUS | Status: DC
Start: 2020-12-03 — End: 2020-12-03

## 2020-12-03 MED ORDER — HYDROXYZINE HCL 25 MG PO TABS: 25.0000 mg | ORAL_TABLET | Freq: Two times a day (BID) | ORAL | Status: DC

## 2020-12-03 MED ORDER — METOPROLOL TARTRATE 25 MG PO TABS
25.0000 mg | ORAL_TABLET | Freq: Two times a day (BID) | ORAL | Status: DC
  Administered 2020-12-04 – 2020-12-07 (×6): 25 mg via ORAL
  Filled 2020-12-03 (×9): qty 1

## 2020-12-03 MED ORDER — INSULIN GLARGINE 100 UNIT/ML ~~LOC~~ SOLN
40.0000 [IU] | Freq: Every day | SUBCUTANEOUS | Status: DC
  Administered 2020-12-04 – 2020-12-06 (×3): 40 [IU] via SUBCUTANEOUS
  Filled 2020-12-03 (×8): qty 0.4

## 2020-12-03 MED ORDER — CALCIUM ACETATE (PHOS BINDER) 667 MG PO CAPS
1334.0000 mg | ORAL_CAPSULE | Freq: Three times a day (TID) | ORAL | Status: DC
  Administered 2020-12-04 – 2020-12-09 (×7): 1334 mg via ORAL
  Filled 2020-12-03 (×14): qty 2

## 2020-12-03 MED ORDER — HYDROMORPHONE HCL 1 MG/ML IJ SOLN
1.0000 mg | INTRAMUSCULAR | Status: DC | PRN
Start: 2020-12-03 — End: 2020-12-05
  Administered 2020-12-03 – 2020-12-05 (×4): 1 mg via INTRAVENOUS
  Filled 2020-12-03 (×4): qty 1

## 2020-12-03 MED ORDER — LEVOTHYROXINE SODIUM 25 MCG PO TABS
12.5000 ug | ORAL_TABLET | Freq: Every day | ORAL | Status: DC
  Administered 2020-12-04 – 2020-12-31 (×28): 12.5 ug via ORAL
  Filled 2020-12-03 (×28): qty 1

## 2020-12-03 MED ORDER — AMIODARONE HCL 200 MG PO TABS
200.0000 mg | ORAL_TABLET | Freq: Every day | ORAL | Status: DC
  Administered 2020-12-04 – 2020-12-20 (×18): 200 mg via ORAL
  Filled 2020-12-03 (×17): qty 1

## 2020-12-03 MED ORDER — ATORVASTATIN CALCIUM 40 MG PO TABS
40.0000 mg | ORAL_TABLET | Freq: Every day | ORAL | Status: DC
  Administered 2020-12-04 – 2020-12-31 (×29): 40 mg via ORAL
  Filled 2020-12-03 (×29): qty 1

## 2020-12-03 MED ORDER — VANCOMYCIN HCL 1750 MG/350ML IV SOLN
1750.0000 mg | Freq: Once | INTRAVENOUS | Status: AC
  Administered 2020-12-03: 1750 mg via INTRAVENOUS
  Filled 2020-12-03: qty 350

## 2020-12-03 MED ORDER — LOPERAMIDE HCL 2 MG PO CAPS
2.0000 mg | ORAL_CAPSULE | Freq: Once | ORAL | Status: AC
  Administered 2020-12-03: 2 mg via ORAL
  Filled 2020-12-03: qty 1

## 2020-12-03 MED ORDER — SODIUM BICARBONATE 650 MG PO TABS
325.0000 mg | ORAL_TABLET | Freq: Two times a day (BID) | ORAL | Status: DC
  Administered 2020-12-04 – 2020-12-12 (×18): 325 mg via ORAL
  Filled 2020-12-03 (×12): qty 1
  Filled 2020-12-03: qty 0.5
  Filled 2020-12-03 (×6): qty 1

## 2020-12-03 MED ORDER — CHOLESTYRAMINE LIGHT 4 G PO PACK
4.0000 g | PACK | Freq: Two times a day (BID) | ORAL | Status: DC
  Administered 2020-12-04 – 2020-12-06 (×5): 4 g via ORAL
  Filled 2020-12-03 (×9): qty 1

## 2020-12-03 MED ORDER — ONDANSETRON HCL 4 MG/2ML IJ SOLN
4.0000 mg | Freq: Four times a day (QID) | INTRAMUSCULAR | Status: DC | PRN
  Administered 2020-12-06: 4 mg via INTRAVENOUS
  Filled 2020-12-03: qty 2

## 2020-12-03 MED ORDER — HYDROMORPHONE HCL 1 MG/ML IJ SOLN
0.5000 mg | Freq: Once | INTRAMUSCULAR | Status: AC
  Administered 2020-12-03: 0.5 mg via INTRAVENOUS
  Filled 2020-12-03: qty 1

## 2020-12-03 MED ORDER — SODIUM CHLORIDE 0.9 % IV SOLN
1.0000 g | INTRAVENOUS | Status: DC
  Administered 2020-12-03 – 2020-12-04 (×2): 1 g via INTRAVENOUS
  Filled 2020-12-03 (×3): qty 1

## 2020-12-03 MED ORDER — INSULIN ASPART 100 UNIT/ML IJ SOLN
3.0000 [IU] | Freq: Three times a day (TID) | INTRAMUSCULAR | Status: DC
  Administered 2020-12-04 – 2020-12-06 (×4): 3 [IU] via SUBCUTANEOUS

## 2020-12-03 MED ORDER — GABAPENTIN 100 MG PO CAPS
100.0000 mg | ORAL_CAPSULE | ORAL | Status: DC
  Administered 2020-12-04 – 2020-12-05 (×3): 100 mg via ORAL
  Filled 2020-12-03 (×3): qty 1

## 2020-12-03 MED ORDER — CHLORHEXIDINE GLUCONATE CLOTH 2 % EX PADS
6.0000 | MEDICATED_PAD | Freq: Every day | CUTANEOUS | Status: DC
  Administered 2020-12-04 – 2020-12-14 (×11): 6 via TOPICAL

## 2020-12-03 MED ORDER — VANCOMYCIN HCL 1000 MG/200ML IV SOLN
1000.0000 mg | INTRAVENOUS | Status: AC
  Filled 2020-12-03 (×3): qty 200

## 2020-12-03 MED ORDER — NITROGLYCERIN 0.4 MG SL SUBL: 0.4000 mg | SUBLINGUAL_TABLET | SUBLINGUAL | Status: DC | PRN

## 2020-12-03 MED ORDER — DOXERCALCIFEROL 4 MCG/2ML IV SOLN: 2.0000 ug | INTRAVENOUS | Status: DC

## 2020-12-03 MED ORDER — INSULIN ASPART 100 UNIT/ML IJ SOLN
0.0000 [IU] | Freq: Three times a day (TID) | INTRAMUSCULAR | Status: DC
  Administered 2020-12-04 (×2): 2 [IU] via SUBCUTANEOUS
  Administered 2020-12-04 – 2020-12-05 (×3): 3 [IU] via SUBCUTANEOUS
  Administered 2020-12-05: 2 [IU] via SUBCUTANEOUS
  Administered 2020-12-06: 3 [IU] via SUBCUTANEOUS
  Administered 2020-12-06 – 2020-12-09 (×2): 2 [IU] via SUBCUTANEOUS
  Administered 2020-12-09 – 2020-12-10 (×2): 3 [IU] via SUBCUTANEOUS
  Administered 2020-12-10 (×2): 2 [IU] via SUBCUTANEOUS
  Administered 2020-12-11: 3 [IU] via SUBCUTANEOUS
  Administered 2020-12-11: 2 [IU] via SUBCUTANEOUS
  Administered 2020-12-11 – 2020-12-12 (×3): 3 [IU] via SUBCUTANEOUS
  Administered 2020-12-12: 5 [IU] via SUBCUTANEOUS
  Administered 2020-12-13: 3 [IU] via SUBCUTANEOUS
  Administered 2020-12-13: 2 [IU] via SUBCUTANEOUS
  Administered 2020-12-14: 5 [IU] via SUBCUTANEOUS
  Administered 2020-12-14 – 2020-12-15 (×5): 3 [IU] via SUBCUTANEOUS
  Administered 2020-12-16 (×2): 5 [IU] via SUBCUTANEOUS
  Administered 2020-12-16: 3 [IU] via SUBCUTANEOUS
  Administered 2020-12-17 (×2): 2 [IU] via SUBCUTANEOUS
  Administered 2020-12-18: 3 [IU] via SUBCUTANEOUS
  Administered 2020-12-18: 5 [IU] via SUBCUTANEOUS
  Administered 2020-12-18 – 2020-12-19 (×2): 2 [IU] via SUBCUTANEOUS
  Administered 2020-12-19: 3 [IU] via SUBCUTANEOUS
  Administered 2020-12-19 – 2020-12-20 (×2): 2 [IU] via SUBCUTANEOUS
  Administered 2020-12-21 (×3): 3 [IU] via SUBCUTANEOUS
  Administered 2020-12-22 (×2): 2 [IU] via SUBCUTANEOUS
  Administered 2020-12-23 (×2): 3 [IU] via SUBCUTANEOUS
  Administered 2020-12-23: 5 [IU] via SUBCUTANEOUS
  Administered 2020-12-24: 2 [IU] via SUBCUTANEOUS
  Administered 2020-12-24: 3 [IU] via SUBCUTANEOUS

## 2020-12-03 MED ORDER — ISOSORBIDE MONONITRATE ER 30 MG PO TB24
30.0000 mg | ORAL_TABLET | Freq: Every day | ORAL | Status: DC
  Administered 2020-12-04 (×2): 30 mg via ORAL
  Filled 2020-12-03 (×2): qty 1

## 2020-12-03 MED ORDER — APIXABAN 2.5 MG PO TABS
2.5000 mg | ORAL_TABLET | Freq: Two times a day (BID) | ORAL | Status: DC
  Administered 2020-12-04 – 2020-12-12 (×18): 2.5 mg via ORAL
  Filled 2020-12-03 (×20): qty 1

## 2020-12-03 NOTE — ED Notes (Signed)
Patient to HD at this time.

## 2020-12-03 NOTE — ED Provider Notes (Signed)
Emergency Department Provider Note   I have reviewed the triage vital signs and the nursing notes.   HISTORY  Chief Complaint Code Sepsis (Right leg infection)   HPI Kyle Walton is a 65 y.o. male with PMH of DM, CAD, ESRD presents to the ED with severe right leg pain with visible leg wound.  Patient states he began having pain in the leg 2 days ago without known injury.  He was due to have dialysis today but could not get up to put weight on the leg because of pain and generalized weakness.  EMS was called and found the patient to be very tachycardic and hyperglycemic.  He is followed by wound care for a wound to the right arm but the leg wound is new.  He has had toe amputations on the right foot but denies pain or drainage in that location.  In addition to the lower leg he is having pain in the right lateral thigh which is severe and worse with touching the area.  Denies any abdominal or chest pain.  No shortness of breath.  No upper respiratory infection symptoms. EMS gave 250 ml NS bolus PTA.   Past Medical History:  Diagnosis Date  . Diabetes mellitus without complication ([place])   . Diastolic heart failure ([place])   . ESRD (end stage renal disease) ([place])   . MI (myocardial infarction) ([place])   . OSA (obstructive sleep apnea)   . PAF (paroxysmal atrial fibrillation) ([place])   . Renal disorder    on dialysis    Patient Active Problem List   Diagnosis Date Noted  . Cellulitis of right leg 12/03/2020  . Sepsis ([place]) 12/03/2020  . Aggressive behavior   . CAD (coronary artery disease) 10/27/2020  . 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  . Cardiopulmonary arrest with successful resuscitation ([place]) 10/27/2020  . Diabetes mellitus with peripheral vascular disease ([place]) 10/27/2020  . History of DVT (deep vein thrombosis) 10/27/2020  . Essential hypertension 10/27/2020  . OSA (obstructive sleep apnea) 10/27/2020  . Paroxysmal atrial fibrillation ([place]) 10/27/2020  . Secondary hyperparathyroidism,  renal ([place]) 10/27/2020  . 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 Chronic diastolic congestive heart failure, NYHA class 4 ([place]) 10/27/2020  . ESRD on hemodialysis ([place]) 10/27/2020  . Impaired physical mobility 10/27/2020  . Chronic respiratory failure with hypercapnia ([place]) 10/27/2020  . 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 History of major depression 10/27/2020  . History of macular degeneration 10/27/2020  . Legally blind 10/27/2020    Past Surgical History:  Procedure Laterality Date  . CORONARY ARTERY BYPASS GRAFT  2017   LIMA LAD, SVG PDA, OM3  . DIALYSIS FISTULA CREATION    . TOE AMPUTATION Bilateral     Allergies Morphine and Liraglutide  History reviewed. No pertinent family history.  Social History Social History   Tobacco Use  . Smoking status: Never Smoker  . Smokeless tobacco: Never Used  Substance Use Topics  . Alcohol use: Not Currently  . Drug use: Not Currently    Review of Systems  Constitutional: No fever/chills Eyes: No visual changes. ENT: No sore throat. Cardiovascular: Denies chest pain. Respiratory: Denies shortness of breath. Gastrointestinal: No abdominal pain.  No nausea, no vomiting.  No diarrhea.  No constipation. Genitourinary: Negative for dysuria. Musculoskeletal: Negative for back pain. Positive right leg pain.  Skin: Right leg wound.  Neurological: Negative for headaches, focal weakness or numbness.  10-point ROS otherwise negative.  ____________________________________________   PHYSICAL EXAM:  VITAL SIGNS: ED Triage Vitals  Enc Vitals Group     BP 12/03/20 1236 111/70     Pulse Rate 12/03/20 1236 (!) 119     Resp 12/03/20 1236 (!) 25     Temp 12/03/20 1236 98.4 F (36.9 C)     Temp Source 12/03/20 1236 Oral     SpO2 12/03/20 1236 98 %     Weight 12/03/20 1230 260 lb (117.9 kg)     Height 12/03/20 1230 '5\' 10"'$  (1.778 m)   Constitutional: Alert and  oriented. Chronically ill appearing but able to provide a history. Grimacing and moaning in pain with even gentle touch of the right lower leg and lateral right thigh.  Eyes: Conjunctivae are normal.  Head: Atraumatic. Nose: No congestion/rhinnorhea. Mouth/Throat: Mucous membranes are moist.   Neck: No stridor.   Cardiovascular: Tachycardia. Good peripheral circulation. Grossly normal heart sounds.   Respiratory: Normal respiratory effort.  No retractions. Lungs CTAB. Gastrointestinal: Soft and nontender. No distention.  Musculoskeletal: The patient's right foot is status post amputation of the toes.  There is a wound over the anterior tibia on the right which is ulcerated with some surrounding cellulitis and drainage.  Exquisite tenderness to even light touch over the lateral thigh.  No medial thigh tenderness.  No cellulitis, crepitus in this area. Neurologic:  Normal speech and language. No gross focal neurologic deficits are appreciated.  Skin:  Skin is warm and ulcerated in the right anterior leg.   ____________________________________________   LABS (all labs ordered are listed, but only abnormal results are displayed)  Labs Reviewed  COMPREHENSIVE METABOLIC PANEL - Abnormal; Notable for the following components:      Result Value   CO2 19 (*)    Glucose, Bld 278 (*)    BUN 57 (*)    Creatinine, Ser 9.94 (*)    Calcium 8.3 (*)    Albumin 2.9 (*)    Total Bilirubin 1.3 (*)    GFR, Estimated 5 (*)    Anion gap 17 (*)    All other components within normal limits  CBC WITH DIFFERENTIAL/PLATELET - Abnormal; Notable for the following components:   WBC 18.3 (*)    RBC 3.57 (*)    Hemoglobin 10.9 (*)    HCT 33.9 (*)    RDW 17.0 (*)    Platelets 148 (*)    Neutro Abs 15.8 (*)    Monocytes Absolute 1.6 (*)    Abs Immature Granulocytes 0.20 (*)    All other components within normal limits  PROTIME-INR - Abnormal; Notable for the following components:   Prothrombin Time 18.9 (*)     INR 1.6 (*)    All other components within normal limits  BASIC METABOLIC PANEL - Abnormal; Notable for the following components:   Glucose, Bld 142 (*)    BUN 30 (*)    Creatinine, Ser 6.10 (*)    Calcium 8.3 (*)    GFR, Estimated 10 (*)    All other components within normal limits  CBC - Abnormal; Notable for the following components:   WBC 16.2 (*)    RBC 3.40 (*)    Hemoglobin 10.2 (*)    HCT 32.0 (*)    RDW 16.8 (*)    Platelets 145 (*)    All other components within normal limits  PROTIME-INR - Abnormal; Notable for the following components:   Prothrombin Time 18.4 (*)    INR 1.5 (*)  All other components within normal limits  RESP PANEL BY RT-PCR (FLU A&B, COVID) ARPGX2  CULTURE, BLOOD (ROUTINE X 2)  CULTURE, BLOOD (ROUTINE X 2)  LACTIC ACID, PLASMA  LACTIC ACID, PLASMA  APTT  HEPATITIS B SURFACE ANTIGEN  HEMOGLOBIN A1C  HEPATITIS B SURFACE ANTIBODY, QUANTITATIVE   ____________________________________________  EKG   EKG Interpretation  Date/Time:  Friday Dec 03 2020 12:34:15 EDT Ventricular Rate:  120 PR Interval:    QRS Duration: 183 QT Interval:  397 QTC Calculation: 561 R Axis:   30 Text Interpretation: Junctional tachycardia IVCD, consider atypical LBBB Similar to April 2022 tracing Confirmed by Nanda Quinton [phone]) on 12/04/2020 7:41:45 AM       ____________________________________________  RADIOLOGY  DG Chest 1 View  Result Date: 12/03/2020 CLINICAL DATA:  Leg pain Suspected sepsis EXAM: CHEST  1 VIEW COMPARISON:  12/01/2020 FINDINGS: Evaluation of the chest limited due to expiratory phase of imaging. Unchanged cardiomegaly. Mild pulmonary vascular congestion is unchanged. Left IJ dialysis catheter terminates in the right atrium, unchanged from prior exam. Right chest wall AICD unchanged in configuration. Sternal fixation wires and hardware again seen. Lungs are clear. IMPRESSION: Unchanged cardiomegaly and pulmonary vascular congestion.  Electronically Signed   By: Miachel Roux M.D.   On: 12/03/2020 14:45   DG Tibia/Fibula Right  Result Date: 12/03/2020 CLINICAL DATA:  Right leg pain. EXAM: RIGHT TIBIA AND FIBULA - 2 VIEW COMPARISON:  None. FINDINGS: There is no evidence of fracture or other focal bone lesions. Vascular calcifications are noted. IMPRESSION: No acute abnormality is noted. Electronically Signed   By: Marijo Conception M.D.   On: 12/03/2020 14:47   DG Hip Unilat W or Wo Pelvis 2-3 Views Right  Result Date: 12/03/2020 CLINICAL DATA:  Right leg pain. EXAM: DG HIP (WITH OR WITHOUT PELVIS) 2-3V RIGHT COMPARISON:  None. FINDINGS: There is no evidence of hip fracture or dislocation. There is no evidence of arthropathy or other focal bone abnormality. IMPRESSION: Negative. Electronically Signed   By: Marijo Conception M.D.   On: 12/03/2020 14:46    ____________________________________________   PROCEDURES  Procedure(s) performed:   Procedures  CRITICAL CARE Performed by: Margette Fast Total critical care time: 35 minutes Critical care time was exclusive of separately billable procedures and treating other patients. Critical care was necessary to treat or prevent imminent or life-threatening deterioration. Critical care was time spent personally by me on the following activities: development of treatment plan with patient and/or surrogate as well as nursing, discussions with consultants, evaluation of patient's response to treatment, examination of patient, obtaining history from patient or surrogate, ordering and performing treatments and interventions, ordering and review of laboratory studies, ordering and review of radiographic studies, pulse oximetry and re-evaluation of patient's condition.  Nanda Quinton, MD Emergency Medicine  ____________________________________________   INITIAL IMPRESSION / ASSESSMENT AND PLAN / ED COURSE  Pertinent labs & imaging results that were available during my care of the patient  were reviewed by me and considered in my medical decision making (see chart for details).   Patient presents to the emergency department with right leg pain.  There is a wound to the right anterior lower leg with erythema.  The erythema does not track upward but does have exquisite tenderness in the right lateral thigh and hip.  He is tachycardic on arrival but afebrile.  Blood pressures are normal.  Patient has sepsis labs pending. Will start empiric abx and IVF.   Labs show a normal lactic  acid but elevated white blood cell count.  Plain films with no subcutaneous gas or obvious bony involvement for infection.  Have covered with broad-spectrum antibiotics for likely early sepsis.   Discussed patient's case with [facility] to request admission. Patient and family (if present) updated with plan. Care transferred to [facility] service.  I reviewed all nursing notes, vitals, pertinent old records, EKGs, labs, imaging (as available).  ____________________________________________  FINAL CLINICAL IMPRESSION(S) / ED DIAGNOSES  Final diagnoses:  Cellulitis of right lower extremity     MEDICATIONS GIVEN DURING THIS VISIT:  Medications  HYDROcodone-acetaminophen (NORCO/VICODIN) 5-325 MG per tablet 1 tablet (1 tablet Oral Given 12/04/20 0500)  amiodarone (PACERONE) tablet 200 mg (200 mg Oral Given 12/04/20 0318)  amLODipine (NORVASC) tablet 2.5 mg (2.5 mg Oral Not Given 12/04/20 0319)  atorvastatin (LIPITOR) tablet 40 mg (40 mg Oral Given 12/04/20 0318)  isosorbide mononitrate (IMDUR) 24 hr tablet 30 mg (30 mg Oral Given 12/04/20 0318)  metoprolol tartrate (LOPRESSOR) tablet 25 mg (25 mg Oral Not Given 12/04/20 0319)  nitroGLYCERIN (NITROSTAT) SL tablet 0.4 mg (has no administration in time range)  insulin glargine (LANTUS) injection 40 Units (has no administration in time range)  levothyroxine (SYNTHROID) tablet 12.5 mcg (12.5 mcg Oral Given 12/04/20 0504)  calcium acetate (PHOSLO) capsule 1,334 mg (has no  administration in time range)  pantoprazole (PROTONIX) EC tablet 40 mg (40 mg Oral Given 12/04/20 0318)  sodium bicarbonate tablet 325 mg (325 mg Oral Given 12/04/20 0322)  clopidogrel (PLAVIX) tablet 75 mg (75 mg Oral Given 12/04/20 0318)  apixaban (ELIQUIS) tablet 2.5 mg (2.5 mg Oral Given 12/04/20 0322)  gabapentin (NEURONTIN) capsule 100 mg (has no administration in time range)  0.45 % sodium chloride infusion ( Intravenous New Bag/Given 12/03/20 1947)  insulin aspart (novoLOG) injection 0-15 Units (has no administration in time range)  insulin aspart (novoLOG) injection 3 Units (has no administration in time range)  ceFEPIme (MAXIPIME) 1 g in sodium chloride 0.9 % 100 mL IVPB (0 g Intravenous Stopped 12/03/20 2034)  HYDROmorphone (DILAUDID) injection 1 mg (1 mg Intravenous Given 12/03/20 1933)  ondansetron (ZOFRAN) injection 4 mg (has no administration in time range)  cholestyramine light (PREVALITE) packet 4 g (has no administration in time range)  Chlorhexidine Gluconate Cloth 2 % PADS 6 each (has no administration in time range)  doxercalciferol (HECTOROL) injection 2 mcg (has no administration in time range)  vancomycin (VANCOREADY) IVPB 1000 mg/200 mL (has no administration in time range)  heparin sodium (porcine) 1000 UNIT/ML injection (has no administration in time range)  hydrOXYzine (ATARAX/VISTARIL) tablet 25 mg (25 mg Oral Given 12/04/20 0500)  sodium chloride 0.9 % bolus 250 mL (0 mLs Intravenous Stopped 12/03/20 1345)  HYDROmorphone (DILAUDID) injection 0.5 mg (0.5 mg Intravenous Given 12/03/20 1310)  vancomycin (VANCOREADY) IVPB 1750 mg/350 mL (0 mg Intravenous Stopped 12/03/20 1706)  cefTRIAXone (ROCEPHIN) 2 g in sodium chloride 0.9 % 100 mL IVPB (0 g Intravenous Stopped 12/03/20 1345)  loperamide (IMODIUM) capsule 2 mg (2 mg Oral Given 12/03/20 1346)  HYDROmorphone (DILAUDID) injection 1 mg (1 mg Intravenous Given 12/03/20 1433)  furosemide (LASIX) injection 40 mg (40 mg Intravenous  Given 12/03/20 1933)     Note:  This document was prepared using Dragon voice recognition software and may include unintentional dictation errors.  Nanda Quinton, MD, [facility] Emergency Medicine    Arul Farabee, [facility], MD 12/04/20 [phone]

## 2020-12-03 NOTE — ED Triage Notes (Signed)
Pt brought in by ems for c/o right leg infection; pt is dialysis pt and is due today; pt had heart rate in 120's with ems; cbg 315; pt has wound to right forearm that is wrapped and being seen by wound care specialist; pt has redness and slough to red lower leg

## 2020-12-03 NOTE — Consult Note (Signed)
Reason for Consult: ESRD Referring Physician:  Dr. Nanda Quinton  Chief Complaint: rt leg pain  Home meds: - amiodarone 200 qd/ norvasc 2.5 qd/ lipitor 40/ plavix 75/ imdur 30 qd/ eliquis 2.5 bid/ metoprolol 25 bid/ sl ntg prn - sod bicarb 325 bid/ phoslo 2 ac tid/ protonix 40/ synthroid 12.5 ug qd - insulin lispro SSI 2- 12ug ac tid/ glargine 40u hs - neurontin 100 bid - prn's/ vitamins/ supplements  OP [id number] MWF 4h 450/800 117kg 2K/3Ca bath TDC Hep 4900 + 500u/hr - zemplar 3.5 ug tiw - aranesp 85 mg q wed   Assessment/Plan: 1. ESRD - gets HD per [facility] in [facility], MWF. Started HD in 2017.  - Plan on HD today to get back on MWF regimen bec of logistics w/ staffing.  2. HTN/ vol - BP's are soft, have dc'd low dose norvasc. No far off  EDW but he has edema in the lower ext,  profile 2 and attempt 2-3L UF today.  3. MBD ckd - cont phoslo, vdra per pharm and will check phos level. 4. Anemia ckd - last Hb 10.9.Cont darbe 75 weekly.    5. Cellulitis - on abx per primary team. 6. Atrial fib - per pmd, on home eliquis 2.5 bid and home amio/ metoprolol 7. CAD hx CABG/ PCI - on home imdur, plavix, metoprolol 8. Debility - per PT needs SNF placement. Pt states he might be going home and per notes it appears may even be today.    HPI: Kyle Walton is an 65 y.o. male DM CASHD s/p CABG x3V pAfib  CV/defibrillator, obesity, PAD with multiple toe amputations including right TMA,  legally blind HTN ESRD who dialyzes at [facility] [address] in [facility] MWF presenting with severe right leg pain and a leg wound which started as pain in the right leg 2 days ago with no known trauma. He missed dialysis today because of severe pain and not being able to put weight on it with associated weakness as well. EMS found the patient tachycardic and hyperglycemic. He has had amputations to the right food but denies pain, open wounds or drainage from the prior wound sites. Main area of  pain is in the right upper leg/ thigh even though there are open excoriations in the anterior aspect of the right lower leg. He denies dyspnea, CP, cough, fever, chills, sore throat.   ROS Pertinent items are noted in HPI.  Chemistry and CBC: Creatinine, Ser  Date/Time Value Ref Range Status  12/03/2020 01:11 PM 9.94 (H) 0.61 - 1.24 mg/dL Final  11/01/2020 05:00 AM 7.60 (H) 0.61 - 1.24 mg/dL Final  10/26/2020 02:52 PM 6.14 (H) 0.61 - 1.24 mg/dL Final  01/17/2020 04:20 PM 10.40 (H) 0.61 - 1.24 mg/dL Final  01/17/2020 04:05 PM 9.28 (H) 0.61 - 1.24 mg/dL Final   Recent Labs  Lab 12/03/20 1311  NA 135  K 4.6  CL 99  CO2 19*  GLUCOSE 278*  BUN 57*  CREATININE 9.94*  CALCIUM 8.3*   Recent Labs  Lab 12/03/20 1311  WBC 18.3*  NEUTROABS 15.8*  HGB 10.9*  HCT 33.9*  MCV 95.0  PLT 148*   Liver Function Tests: Recent Labs  Lab 12/03/20 1311  AST 38  ALT 12  ALKPHOS 77  BILITOT 1.3*  PROT 6.8  ALBUMIN 2.9*   No results for input(s): LIPASE, AMYLASE in the last 168 hours. No results for input(s): AMMONIA in the last 168 hours. Cardiac Enzymes: No results  for input(s): CKTOTAL, CKMB, CKMBINDEX, TROPONINI in the last 168 hours. Iron Studies: No results for input(s): IRON, TIBC, TRANSFERRIN, FERRITIN in the last 72 hours. PT/INR: '@LABRCNTIP'$ (inr:5)  Xrays/Other Studies: ) Results for orders placed or performed during the hospital encounter of 12/03/20 (from the past 48 hour(s))  Resp Panel by RT-PCR (Flu A&B, Covid) Nasopharyngeal Swab     Status: None   Collection Time: 12/03/20 12:52 PM   Specimen: Nasopharyngeal Swab; Nasopharyngeal(NP) swabs in vial transport medium  Result Value Ref Range   SARS Coronavirus 2 by RT PCR NEGATIVE NEGATIVE    Comment: (NOTE) SARS-CoV-2 target nucleic acids are NOT DETECTED.  The SARS-CoV-2 RNA is generally detectable in upper respiratory specimens during the acute phase of infection. The lowest concentration of SARS-CoV-2 viral  copies this assay can detect is 138 copies/mL. A negative result does not preclude SARS-Cov-2 infection and should not be used as the sole basis for treatment or other patient management decisions. A negative result may occur with  improper specimen collection/handling, submission of specimen other than nasopharyngeal swab, presence of viral mutation(s) within the areas targeted by this assay, and inadequate number of viral copies(<138 copies/mL). A negative result must be combined with clinical observations, patient history, and epidemiological information. The expected result is Negative.  Fact Sheet for Patients:  EntrepreneurPulse.com.au  Fact Sheet for Healthcare Providers:  IncredibleEmployment.be  This test is no t yet approved or cleared by the [place] FDA and  has been authorized for detection and/or diagnosis of SARS-CoV-2 by FDA under an Emergency Use Authorization (EUA). This EUA will remain  in effect (meaning this test can be used) for the duration of the COVID-19 declaration under Section 564(b)(1) of the Act, 21 U.S.C.section 360bbb-3(b)(1), unless the authorization is terminated  or revoked sooner.       Influenza A by PCR NEGATIVE NEGATIVE   Influenza B by PCR NEGATIVE NEGATIVE    Comment: (NOTE) The Xpert Xpress SARS-CoV-2/FLU/RSV plus assay is intended as an aid in the diagnosis of influenza from Nasopharyngeal swab specimens and should not be used as a sole basis for treatment. Nasal washings and aspirates are unacceptable for Xpert Xpress SARS-CoV-2/FLU/RSV testing.  Fact Sheet for Patients: EntrepreneurPulse.com.au  Fact Sheet for Healthcare Providers: IncredibleEmployment.be  This test is not yet approved or cleared by the [place] FDA and has been authorized for detection and/or diagnosis of SARS-CoV-2 by FDA under an Emergency Use Authorization (EUA). This EUA will  remain in effect (meaning this test can be used) for the duration of the COVID-19 declaration under Section 564(b)(1) of the Act, 21 U.S.C. section 360bbb-3(b)(1), unless the authorization is terminated or revoked.  Performed at [facility], [place] [address] [place],  [address]   Comprehensive metabolic panel     Status: Abnormal   Collection Time: 12/03/20  1:11 PM  Result Value Ref Range   Sodium 135 135 - 145 mmol/L   Potassium 4.6 3.5 - 5.1 mmol/L   Chloride 99 98 - 111 mmol/L   CO2 19 (L) 22 - 32 mmol/L   Glucose, Bld 278 (H) 70 - 99 mg/dL    Comment: Glucose reference range applies only to samples taken after fasting for at least 8 hours.   BUN 57 (H) 8 - 23 mg/dL   Creatinine, Ser 9.94 (H) 0.61 - 1.24 mg/dL   Calcium 8.3 (L) 8.9 - 10.3 mg/dL   Total Protein 6.8 6.5 - 8.1 g/dL   Albumin 2.9 (L) 3.5 -  5.0 g/dL   AST 38 15 - 41 U/L   ALT 12 0 - 44 U/L   Alkaline Phosphatase 77 38 - 126 U/L   Total Bilirubin 1.3 (H) 0.3 - 1.2 mg/dL   GFR, Estimated 5 (L) >60 mL/min    Comment: (NOTE) Calculated using the CKD-EPI Creatinine Equation (2021)    Anion gap 17 (H) 5 - 15    Comment: Performed at [facility] [address], [place], [place] [address]  Lactic acid, plasma     Status: None   Collection Time: 12/03/20  1:11 PM  Result Value Ref Range   Lactic Acid, Venous 1.8 0.5 - 1.9 mmol/L    Comment: Performed at [facility] [address], [place], [place] [address]  CBC with Differential     Status: Abnormal   Collection Time: 12/03/20  1:11 PM  Result Value Ref Range   WBC 18.3 (H) 4.0 - 10.5 K/uL   RBC 3.57 (L) 4.22 - 5.81 MIL/uL   Hemoglobin 10.9 (L) 13.0 - 17.0 g/dL   HCT 33.9 (L) 39.0 - 52.0 %   MCV 95.0 80.0 - 100.0 fL   MCH 30.5 26.0 - 34.0 pg   MCHC 32.2 30.0 - 36.0 g/dL   RDW 17.0 (H) 11.5 - 15.5 %   Platelets 148 (L) 150 - 400 K/uL   nRBC 0.0 0.0 - 0.2 %   Neutrophils Relative % 86 %   Neutro Abs 15.8 (H) 1.7 - 7.7 K/uL    Lymphocytes Relative 4 %   Lymphs Abs 0.7 0.7 - 4.0 K/uL   Monocytes Relative 9 %   Monocytes Absolute 1.6 (H) 0.1 - 1.0 K/uL   Eosinophils Relative 0 %   Eosinophils Absolute 0.0 0.0 - 0.5 K/uL   Basophils Relative 0 %   Basophils Absolute 0.0 0.0 - 0.1 K/uL   Immature Granulocytes 1 %   Abs Immature Granulocytes 0.20 (H) 0.00 - 0.07 K/uL    Comment: Performed at [facility], [address] [place], [place] [address]  Protime-INR     Status: Abnormal   Collection Time: 12/03/20  1:11 PM  Result Value Ref Range   Prothrombin Time 18.9 (H) 11.4 - 15.2 seconds   INR 1.6 (H) 0.8 - 1.2    Comment: (NOTE) INR goal varies based on device and disease states. Performed at [facility], [place] [address], [place], [place] [address]   Lactic acid, plasma     Status: None   Collection Time: 12/03/20  2:44 PM  Result Value Ref Range   Lactic Acid, Venous 1.9 0.5 - 1.9 mmol/L    Comment: Performed at [facility] [address], [place], [place] [address]   DG Chest 1 View  Result Date: 12/03/2020 CLINICAL DATA:  Leg pain Suspected sepsis EXAM: CHEST  1 VIEW COMPARISON:  12/01/2020 FINDINGS: Evaluation of the chest limited due to expiratory phase of imaging. Unchanged cardiomegaly. Mild pulmonary vascular congestion is unchanged. Left IJ dialysis catheter terminates in the right atrium, unchanged from prior exam. Right chest wall AICD unchanged in configuration. Sternal fixation wires and hardware again seen. Lungs are clear. IMPRESSION: Unchanged cardiomegaly and pulmonary vascular congestion. Electronically Signed   By: Miachel Roux M.D.   On: 12/03/2020 14:45   DG Tibia/Fibula Right  Result Date: 12/03/2020 CLINICAL DATA:  Right leg pain. EXAM: RIGHT TIBIA AND FIBULA - 2 VIEW COMPARISON:  None. FINDINGS: There is no evidence  of fracture or other focal bone lesions. Vascular calcifications are noted. IMPRESSION: No acute abnormality is noted. Electronically Signed   By: Marijo Conception M.D.   On: 12/03/2020 14:47   DG Hip Unilat W or Wo Pelvis 2-3 Views Right  Result Date: 12/03/2020 CLINICAL DATA:  Right leg pain. EXAM: DG HIP (WITH OR WITHOUT PELVIS) 2-3V RIGHT COMPARISON:  None. FINDINGS: There is no evidence of hip fracture or dislocation. There is no evidence of arthropathy or other focal bone abnormality. IMPRESSION: Negative. Electronically Signed   By: Marijo Conception M.D.   On: 12/03/2020 14:46    PMH:   Past Medical History:  Diagnosis Date  . Diabetes mellitus without complication ([place])   . MI (myocardial infarction) ([place])   . Renal disorder    on dialysis    PSH:   Past Surgical History:  Procedure Laterality Date  . DIALYSIS FISTULA CREATION    . TOE AMPUTATION Bilateral     Allergies:  Allergies  Allergen Reactions  . Morphine Other (See Comments)    Per son, [name], pt becomes unresponsive/disoriented. Especially when given after HD tx  . Liraglutide Diarrhea    Phenol Phenol     Medications:   Prior to Admission medications   Medication Sig Start Date End Date Taking? Authorizing Provider  amiodarone (PACERONE) 200 MG tablet Take 200 mg by mouth daily. 10/13/20   [provider]  amLODipine (NORVASC) 2.5 MG tablet Take 2.5 mg by mouth daily. 10/13/20   [provider]  atorvastatin (LIPITOR) 40 MG tablet Take 40 mg by mouth daily. 09/23/20   [provider]  calcium acetate (PHOSLO) 667 MG capsule Take 1,334 mg by mouth 3 (three) times daily with meals. 08/23/20   [provider]  clopidogrel (PLAVIX) 75 MG tablet Take 75 mg by mouth daily. 10/13/20   [provider]  ELIQUIS 2.5 MG TABS tablet Take 2.5 mg by mouth 2 (two) times daily. 10/13/20   [provider]  ergocalciferol (VITAMIN D2) 1.25 MG (50000 UT) capsule Take 50,000 Units by mouth every 30 (thirty) days.    [provider]  gabapentin (NEURONTIN) 100 MG capsule Take 100 mg by mouth in the morning and at bedtime.  08/23/20   [provider]  HYDROcodone-acetaminophen (NORCO/VICODIN) 5-325 MG tablet Take 1 tablet by mouth every 6 (six) hours as needed for moderate pain. 01/17/20   Fredia Sorrow, MD  hydrOXYzine (ATARAX/VISTARIL) 25 MG tablet Take 25 mg by mouth 2 (two) times daily. 10/13/20   [provider]  Insulin Glargine (BASAGLAR KWIKPEN) 100 UNIT/ML Inject 40 Units into the skin at bedtime. 08/23/20   [provider]  insulin lispro (HUMALOG) 100 UNIT/ML KwikPen Inject into the skin. Inject two Units to twelve Units into the skin 15 (fifteen) minutes before meals for 30 days. 180-200 = 2 units 201-250 = 5 units 251-300 = 7 units 301-400 = 12 units and call MD 08/23/20   [provider]  isosorbide mononitrate (IMDUR) 30 MG 24 hr tablet Take 30 mg by mouth daily. 10/13/20   [provider]  latanoprost (XALATAN) 0.005 % ophthalmic solution Place 1 drop into both eyes at bedtime. 08/23/20   [provider]  levothyroxine (SYNTHROID) 25 MCG tablet Take 12.5 mcg by mouth daily before breakfast. 10/13/20   [provider]  metoprolol tartrate (LOPRESSOR) 50 MG tablet Take 25 mg by mouth 2 (two) times daily. 10/13/20   [provider]  nitroGLYCERIN (  NITROSTAT) 0.3 MG SL tablet Place 0.3 mg under the tongue every 5 (five) minutes as needed for chest pain. 08/23/20   [provider]  pantoprazole (PROTONIX) 40 MG tablet Take 1 tablet by mouth daily. 10/13/20   [provider]  sodium bicarbonate 650 MG tablet Take 325 mg by mouth 2 (two) times daily. 08/23/20   [provider]    Discontinued Meds:   Medications Discontinued During This Encounter  Medication Reason  . vancomycin (VANCOREADY) IVPB 1000 mg/200 mL     Social History:  reports that he has never smoked. He has never used smokeless tobacco. He reports previous alcohol use. He reports previous drug use.  Family History:  History reviewed. No pertinent family  history.  Blood pressure (!) 126/55, pulse (!) 112, temperature 98.4 F (36.9 C), temperature source Oral, resp. rate 12, height '5\' 10"'$  (1.778 m), weight 117.9 kg, SpO2 97 %.  alert, nad   no jvd  Chest cta bilat  Cor reg no RG  Abd soft ntnd obese   Ext 1+ tibial edema, excoriations in the right lower leg and right upper leg/ thigh is very tender and mildly erythematous   Alert, NF, ox3 LIJ TDC in place c/d/i        Dwana Melena, MD 12/03/2020, 6:33 PM

## 2020-12-03 NOTE — H&P (Signed)
History and Physical    Kyle Walton [id number] DOB: 1956-03-12 DOA: 12/03/2020  PCP: Patient, No Pcp Per (Inactive) (Confirm with patient/family/NH records and if not entered, this has to be entered at [facility] point of entry) Patient coming from: home  I have personally briefly reviewed patient's old medical records in [place]  Chief Complaint: leg pain  HPI: [name] is a 65 y.o. male with medical history significant of CAD, ESRD on HD MWF, DM on insulin, s/p multiple toe amputations, morbid obesity. Patient reports leg pain for the past 3 days. He also reports that he has not been able to ambulate 2/2 pain. He missed his HD today for the same reason. He presentsto [facility] due to leg pain.  ED Course: T 98.4  HR 112  122/55  RR 12. Per EDP patient has purulent cellulitis right leg. Lab revealed glucose 278, Cr. 9.54, lactic acid level 1.8, 1.9. WBC 18.3 with 86/4/9. INR 1.6 Covid negative. CXR with cardiomegaly and pulmonary congestion. Code sepsis initiated in ED: patient received fluid bolus, was administered Vancomycin and Rocephin. TRH called to admit patient for continued treatment.   Review of Systems: As per HPI otherwise 10 point review of systems negative. Has marked pain. No change in appetite.  Past Medical History:  Diagnosis Date  . Diabetes mellitus without complication ([place])   . Diastolic heart failure ([place])   . ESRD (end stage renal disease) ([place])   . MI (myocardial infarction) ([place])   . OSA (obstructive sleep apnea)   . PAF (paroxysmal atrial fibrillation) ([place])   . Renal disorder    on dialysis    Past Surgical History:  Procedure Laterality Date  . CORONARY ARTERY BYPASS GRAFT  2017   LIMA LAD, SVG PDA, OM3  . DIALYSIS FISTULA CREATION    . TOE AMPUTATION Bilateral     Soc Hx - married - wife with visual impairment and multiple medical problems. He was a Insurance account manager.    reports that he has never smoked. He has never  used smokeless tobacco. He reports previous alcohol use. He reports previous drug use.  Allergies  Allergen Reactions  . Morphine Other (See Comments)    Per son, [name], pt becomes unresponsive/disoriented. Especially when given after HD tx  . Liraglutide Diarrhea    Phenol Phenol     History reviewed. No pertinent family history.   Prior to Admission medications   Medication Sig Start Date End Date Taking? Authorizing Provider  amiodarone (PACERONE) 200 MG tablet Take 200 mg by mouth daily. 10/13/20   [provider]  amLODipine (NORVASC) 2.5 MG tablet Take 2.5 mg by mouth daily. 10/13/20   [provider]  atorvastatin (LIPITOR) 40 MG tablet Take 40 mg by mouth daily. 09/23/20   [provider]  calcium acetate (PHOSLO) 667 MG capsule Take 1,334 mg by mouth 3 (three) times daily with meals. 08/23/20   [provider]  clopidogrel (PLAVIX) 75 MG tablet Take 75 mg by mouth daily. 10/13/20   [provider]  ELIQUIS 2.5 MG TABS tablet Take 2.5 mg by mouth 2 (two) times daily. 10/13/20   [provider]  ergocalciferol (VITAMIN D2) 1.25 MG (50000 UT) capsule Take 50,000 Units by mouth every 30 (thirty) days.    [provider]  gabapentin (NEURONTIN) 100 MG capsule Take 100 mg by mouth in the morning and at bedtime. 08/23/20   [provider]  HYDROcodone-acetaminophen (NORCO/VICODIN) 5-325 MG tablet Take  1 tablet by mouth every 6 (six) hours as needed for moderate pain. 01/17/20   Fredia Sorrow, MD  hydrOXYzine (ATARAX/VISTARIL) 25 MG tablet Take 25 mg by mouth 2 (two) times daily. 10/13/20   [provider]  Insulin Glargine (BASAGLAR KWIKPEN) 100 UNIT/ML Inject 40 Units into the skin at bedtime. 08/23/20   [provider]  insulin lispro (HUMALOG) 100 UNIT/ML KwikPen Inject into the skin. Inject two Units to twelve Units into the skin 15 (fifteen) minutes before meals for 30 days. 180-200 = 2 units 201-250 = 5  units 251-300 = 7 units 301-400 = 12 units and call MD 08/23/20   [provider]  isosorbide mononitrate (IMDUR) 30 MG 24 hr tablet Take 30 mg by mouth daily. 10/13/20   [provider]  latanoprost (XALATAN) 0.005 % ophthalmic solution Place 1 drop into both eyes at bedtime. 08/23/20   [provider]  levothyroxine (SYNTHROID) 25 MCG tablet Take 12.5 mcg by mouth daily before breakfast. 10/13/20   [provider]  metoprolol tartrate (LOPRESSOR) 50 MG tablet Take 25 mg by mouth 2 (two) times daily. 10/13/20   [provider]  nitroGLYCERIN (NITROSTAT) 0.3 MG SL tablet Place 0.3 mg under the tongue every 5 (five) minutes as needed for chest pain. 08/23/20   [provider]  pantoprazole (PROTONIX) 40 MG tablet Take 1 tablet by mouth daily. 10/13/20   [provider]  sodium bicarbonate 650 MG tablet Take 325 mg by mouth 2 (two) times daily. 08/23/20   [provider]    Physical Exam: Vitals:   12/03/20 1700 12/03/20 1730 12/03/20 1800 12/03/20 1830  BP: (!) 126/55 133/72 127/62 120/64  Pulse: (!) 112 (!) 113 (!) 112 (!) 112  Resp: '12 14 13 14  '$ Temp:      TempSrc:      SpO2: 97% 100% 100% 100%  Weight:      Height:         Vitals:   12/03/20 1700 12/03/20 1730 12/03/20 1800 12/03/20 1830  BP: (!) 126/55 133/72 127/62 120/64  Pulse: (!) 112 (!) 113 (!) 112 (!) 112  Resp: '12 14 13 14  '$ Temp:      TempSrc:      SpO2: 97% 100% 100% 100%  Weight:      Height:       General:  Morbidly obese man in pain Eyes: PERRL, lids and conjunctivae normal ENMT: Mucous membranes are moist.   Neck: obese, normal, supple, no masses, no thyromegaly Chest: HD catheter left subclavian Respiratory: clear to auscultation bilaterally, no wheezing, crackles worst at left base. Normal respiratory effort. No accessory muscle use.  Cardiovascular: Heasrt sounds distant 2/2 obesity. Regular rate and rhythm, no murmurs / rubs / gallops. No  extremity edema. 2+ pedal pulses. No carotid bruits.  Abdomen: Obese hindering exam, no tenderness, no masses palpated. No hepatosplenomegaly. Bowel sounds positive.  Musculoskeletal: no clubbing / cyanosis. No joint deformity upper and lower extremities. Good ROM, no contractures. Normal muscle tone.  Skin: Right great toe stump with ulcer at tip, distal LE anteriorly with erythema and area of purulent desquamation, 3 rd toe left with ulceration. Neurologic: CN 2-12 grossly intact. Sensation decreased. Strength 4/5 in all 4.  Psychiatric: Normal judgment and insight. Alert and oriented x 3. Normal mood.     Labs on Admission: I have personally reviewed following labs and imaging studies  CBC: Recent Labs  Lab 12/03/20 1311  WBC 18.3*  NEUTROABS 15.8*  HGB 10.9*  HCT 33.9*  MCV 95.0  PLT 123456*   Basic Metabolic Panel: Recent Labs  Lab 12/03/20 1311  NA 135  K 4.6  CL 99  CO2 19*  GLUCOSE 278*  BUN 57*  CREATININE 9.94*  CALCIUM 8.3*   GFR: Estimated Creatinine Clearance: 9.5 mL/min (A) (by C-G formula based on SCr of 9.94 mg/dL (H)). Liver Function Tests: Recent Labs  Lab 12/03/20 1311  AST 38  ALT 12  ALKPHOS 77  BILITOT 1.3*  PROT 6.8  ALBUMIN 2.9*   No results for input(s): LIPASE, AMYLASE in the last 168 hours. No results for input(s): AMMONIA in the last 168 hours. Coagulation Profile: Recent Labs  Lab 12/03/20 1311  INR 1.6*   Cardiac Enzymes: No results for input(s): CKTOTAL, CKMB, CKMBINDEX, TROPONINI in the last 168 hours. BNP (last 3 results) No results for input(s): PROBNP in the last 8760 hours. HbA1C: No results for input(s): HGBA1C in the last 72 hours. CBG: No results for input(s): GLUCAP in the last 168 hours. Lipid Profile: No results for input(s): CHOL, HDL, LDLCALC, TRIG, CHOLHDL, LDLDIRECT in the last 72 hours. Thyroid Function Tests: No results for input(s): TSH, T4TOTAL, FREET4, T3FREE, THYROIDAB in the last 72 hours. Anemia  Panel: No results for input(s): VITAMINB12, FOLATE, FERRITIN, TIBC, IRON, RETICCTPCT in the last 72 hours. Urine analysis: No results found for: COLORURINE, APPEARANCEUR, LABSPEC, [place], GLUCOSEU, [place], BILIRUBINUR, KETONESUR, PROTEINUR, UROBILINOGEN, NITRITE, LEUKOCYTESUR  Radiological Exams on Admission: DG Chest 1 View  Result Date: 12/03/2020 CLINICAL DATA:  Leg pain Suspected sepsis EXAM: CHEST  1 VIEW COMPARISON:  12/01/2020 FINDINGS: Evaluation of the chest limited due to expiratory phase of imaging. Unchanged cardiomegaly. Mild pulmonary vascular congestion is unchanged. Left IJ dialysis catheter terminates in the right atrium, unchanged from prior exam. Right chest wall AICD unchanged in configuration. Sternal fixation wires and hardware again seen. Lungs are clear. IMPRESSION: Unchanged cardiomegaly and pulmonary vascular congestion. Electronically Signed   By: Miachel Roux M.D.   On: 12/03/2020 14:45   DG Tibia/Fibula Right  Result Date: 12/03/2020 CLINICAL DATA:  Right leg pain. EXAM: RIGHT TIBIA AND FIBULA - 2 VIEW COMPARISON:  None. FINDINGS: There is no evidence of fracture or other focal bone lesions. Vascular calcifications are noted. IMPRESSION: No acute abnormality is noted. Electronically Signed   By: Marijo Conception M.D.   On: 12/03/2020 14:47   DG Hip Unilat W or Wo Pelvis 2-3 Views Right  Result Date: 12/03/2020 CLINICAL DATA:  Right leg pain. EXAM: DG HIP (WITH OR WITHOUT PELVIS) 2-3V RIGHT COMPARISON:  None. FINDINGS: There is no evidence of hip fracture or dislocation. There is no evidence of arthropathy or other focal bone abnormality. IMPRESSION: Negative. Electronically Signed   By: Marijo Conception M.D.   On: 12/03/2020 14:46    EKG: Independently reviewed. 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 Sepsis - patient presents with infection, tachycardia, cannot assess renal meeting criteria for early sepsis. Protocol initiated with IV fluids given and early start of abx with Vancomycin and rocephin  2. Cellulities -  purulent cellulitis right leg. H/o resistent infections. Compromised host Plan Abx - continue Vancomycin; cefipeime - pharmacy to dose  Wound care - cleanse BID, dry gauze dressing.  Pain control- schedule APAP, Dilaudid for severe pain.   3. ESRD - patient missed HD today. Has mild pulmonary congestion. Plan Nephrology consult requested to determine need for urgent HD and to resume scheduled tx  4. Cardiac - patient with HFpEF, h/o PAF, h/o wide QRS tachycardia. Appears stable with mild pulmonary congestion. Rhythm is currently stable.  Plan Nephrology consult  Lasix 40 mg IV single dose, will follow clinically in regard to additional lasix dosing.  Continue home cardiac meds.  5. DM- markedly elevated Glucose at adm Plan Continue basal insulin  A1C  3 u insulin before meals  Sliding scale coverage.   6. OSA  On CPAP at home Plan CPAP  7. Pruritis - patient got good relief with cholestyramine bid  8. Code status - discussed with patient and wife. He defers to her as HCPOA. Full code for now.  9. Dispo - patient needs in home assistance. Plan  TOC  DVT prophylaxis: Eliquis Code Status: full code  Family Communication: wife present during exam. She understands Dx and Tx plan   Disposition Plan: TBD  Consults called: Nephrology - Dr. Augustin Coupe Admission status: inpatient    Adella Hare MD [facility] Pager [phone]  If 7PM-7AM, please contact night-coverage [email or web address] Password [facility]  12/03/2020, 6:45 PM

## 2020-12-03 NOTE — ED Notes (Signed)
Unable to collect UA d/t pt stating he no longer make urine.

## 2020-12-03 NOTE — ED Notes (Signed)
Unable to obtain second set of cultures . Did not delay starting antibiotics

## 2020-12-03 NOTE — ED Notes (Signed)
Pt found to have O2 sats in upper 70's; pt placed on O2 at 4l via [place] and sats increased to mid to lower 90's;

## 2020-12-03 NOTE — ED Notes (Signed)
Pt had incontinence of bowel; pt cleaned and barrier cream and wound dressing applied

## 2020-12-03 NOTE — ED Notes (Signed)
Pt had episode of diarrhea upon arrival to room; pt was cleaned and new linen placed under pt

## 2020-12-03 NOTE — Progress Notes (Addendum)
Pharmacy Antibiotic Note  Kyle Walton is a 65 y.o. male admitted on 12/03/2020 with cellulitis.  Pharmacy has been consulted for vancomycin and cefepime dosing. Patient presents with PMH significant for DM, CAD, ESRD on HD MWF with chief complain of increased leg pain and weakness. Of notes, patient is followed by wound care for a wound to the right arm, but has a new wound presenting on the right leg. Patient was due for dialysis today but was unable to go as he could not put weight on his leg. Per nephro, plan to complete HD session today, then back to regular schedule. 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   Plan: Vancomycin 1750 mg x1 load followed by vancomycin 1000 MG QHD MWF starting today with HD  Cefepime 1 GM Q24H  Monitor for HD schedule  Monitor clinical status and culture data  Vancomycin levels as indicated   Height: '5\' 10"'$  (177.8 cm) Weight: 117.9 kg (260 lb) IBW/kg (Calculated) : 73  Temp (24hrs), Avg:98.4 F (36.9 C), Min:98.4 F (36.9 C), Max:98.4 F (36.9 C)  No results for input(s): WBC, CREATININE, LATICACIDVEN, VANCOTROUGH, VANCOPEAK, VANCORANDOM, GENTTROUGH, GENTPEAK, GENTRANDOM, TOBRATROUGH, TOBRAPEAK, TOBRARND, AMIKACINPEAK, AMIKACINTROU, AMIKACIN in the last 168 hours.  CrCl cannot be calculated (Patient's most recent lab result is older than the maximum 21 days allowed.).    Allergies  Allergen Reactions  . Morphine Other (See Comments)    Per son, [name], pt becomes unresponsive/disoriented. Especially when given after HD tx  . Liraglutide Diarrhea    Phenol Phenol     Antimicrobials this admission: 5/27 CTX x1  5/27 Vancomycin >>  Dose adjustments this admission:   Microbiology results: 5/27 BCx: collected   Thank you for allowing pharmacy to be a part of this patient's care.  Cephus Slater, PharmD, [facility] Resident [phone] 12/03/2020 1:41 PM

## 2020-12-03 NOTE — ED Notes (Signed)
Pt's spouse at bedside.

## 2020-12-04 DIAGNOSIS — I5032 Chronic diastolic (congestive) heart failure: Secondary | ICD-10-CM | POA: Diagnosis not present

## 2020-12-04 DIAGNOSIS — N186 End stage renal disease: Secondary | ICD-10-CM | POA: Diagnosis not present

## 2020-12-04 DIAGNOSIS — L03115 Cellulitis of right lower limb: Secondary | ICD-10-CM | POA: Diagnosis not present

## 2020-12-04 DIAGNOSIS — E1151 Type 2 diabetes mellitus with diabetic peripheral angiopathy without gangrene: Secondary | ICD-10-CM | POA: Diagnosis not present

## 2020-12-04 LAB — GLUCOSE, CAPILLARY
Glucose-Capillary: 142 mg/dL — ABNORMAL HIGH (ref 70–99)
Glucose-Capillary: 147 mg/dL — ABNORMAL HIGH (ref 70–99)
Glucose-Capillary: 176 mg/dL — ABNORMAL HIGH (ref 70–99)
Glucose-Capillary: 159 mg/dL — ABNORMAL HIGH (ref 70–99)

## 2020-12-04 LAB — CBC
HCT: 32 % — ABNORMAL LOW (ref 39.0–52.0)
Hemoglobin: 10.2 g/dL — ABNORMAL LOW (ref 13.0–17.0)
MCH: 30 pg (ref 26.0–34.0)
MCHC: 31.9 g/dL (ref 30.0–36.0)
MCV: 94.1 fL (ref 80.0–100.0)
Platelets: 145 10*3/uL — ABNORMAL LOW (ref 150–400)
RBC: 3.4 MIL/uL — ABNORMAL LOW (ref 4.22–5.81)
RDW: 16.8 % — ABNORMAL HIGH (ref 11.5–15.5)
WBC: 16.2 10*3/uL — ABNORMAL HIGH (ref 4.0–10.5)
nRBC: 0.1 % (ref 0.0–0.2)

## 2020-12-04 LAB — BASIC METABOLIC PANEL
Anion gap: 14 (ref 5–15)
BUN: 30 mg/dL — ABNORMAL HIGH (ref 8–23)
CO2: 23 mmol/L (ref 22–32)
Calcium: 8.3 mg/dL — ABNORMAL LOW (ref 8.9–10.3)
Chloride: 99 mmol/L (ref 98–111)
Creatinine, Ser: 6.1 mg/dL — ABNORMAL HIGH (ref 0.61–1.24)
GFR, Estimated: 10 mL/min — ABNORMAL LOW (ref >60)
Glucose, Bld: 142 mg/dL — ABNORMAL HIGH (ref 70–99)
Potassium: 4.2 mmol/L (ref 3.5–5.1)
Sodium: 136 mmol/L (ref 135–145)

## 2020-12-04 LAB — HEPATITIS B SURFACE ANTIGEN: Hepatitis B Surface Ag: NONREACTIVE

## 2020-12-04 LAB — APTT: aPTT: 34 seconds (ref 24–36)

## 2020-12-04 LAB — PROTIME-INR
INR: 1.5 — ABNORMAL HIGH (ref 0.8–1.2)
Prothrombin Time: 18.4 seconds — ABNORMAL HIGH (ref 11.4–15.2)

## 2020-12-04 MED ORDER — HYDROXYZINE HCL 25 MG PO TABS
25.0000 mg | ORAL_TABLET | Freq: Two times a day (BID) | ORAL | Status: DC | PRN
  Administered 2020-12-04 – 2020-12-18 (×4): 25 mg via ORAL
  Filled 2020-12-04 (×4): qty 1

## 2020-12-04 MED ORDER — VANCOMYCIN HCL 1000 MG/200ML IV SOLN
1000.0000 mg | Freq: Once | INTRAVENOUS | Status: AC
  Administered 2020-12-04: 1000 mg via INTRAVENOUS
  Filled 2020-12-04: qty 200

## 2020-12-04 MED ORDER — HEPARIN SODIUM (PORCINE) 1000 UNIT/ML IJ SOLN
INTRAMUSCULAR | Status: AC
  Filled 2020-12-04: qty 4

## 2020-12-04 NOTE — Progress Notes (Signed)
Patient was on BiPAP with 3 L bleed in upon RT walking into room.  Patient is resting comfortably on settings.  RT will continue to monitor

## 2020-12-04 NOTE — Progress Notes (Signed)
Patient non compliant refusing full skin assessment and wound care, RN educated patient and will continue to monitor this patient.

## 2020-12-04 NOTE — Progress Notes (Signed)
[facility] Progress Note   Home meds: - amiodarone 200 qd/ norvasc 2.5 qd/ lipitor 40/ plavix 75/ imdur 30 qd/ eliquis 2.5 bid/ metoprolol 25 bid/ sl ntg prn - sod bicarb 325 bid/ phoslo 2 ac tid/ protonix 40/ synthroid 12.5 ug qd - insulin lispro SSI 2- 12ug ac tid/ glargine 40u hs - neurontin 100 bid - prn's/ vitamins/ supplements  OP [id number] MWF 4h 450/800 117kg 2K/3Ca bath TDC Hep 4900 + 500u/hr - zemplar 3.5 ug tiw - aranesp 85 mg q wed  Assessment/ Plan:   1. Cellulitis - on abx per primary team. 2. ESRD - [facility] HD [facility] [place], MWF. Started HD in 2017.  - Tolerated  HD 5/27 with net UF 2 L.  - Next HD on Monday  on MWF regimen; no absolute indications for RRT today.  2. HTN/ vol - BP's are soft, have dc'd low dose norvasc.No far off  EDW but he has edema in the lower ext,  profile 2 and attempt 2-3L UF today.  3. MBD ckd - cont phoslo, vdra per pharm and still need to check phos level. 4. Anemia ckd - last Hb10.9.Cont darbe 75 weekly.   5. Atrial fib - per pmd, on home eliquis 2.5 bid and home amio/ metoprolol 6. CAD hx CABG/ PCI - on home imdur, plavix, metoprolol     Subjective:   Wife bedside; pt pleasant but confused. He doesn't even remember that he had dialysis yest evening. Both pt and spouse are legally blind.   Objective:   BP (!) 104/59 (BP Location: Right Arm)   Pulse (!) 120   Temp 98.8 F (37.1 C) (Oral)   Resp 14   Ht '5\' 10"'$  (1.778 m)   Wt 117.9 kg   SpO2 92%   BMI 37.31 kg/m   Intake/Output Summary (Last 24 hours) at 12/04/2020 [id number] Last data filed at 12/04/2020 0104 Gross per 24 hour  Intake 950 ml  Output 2000 ml  Net -1050 ml   Weight change:   Physical Exam: Gen: alert, nad, pleasant  Neck:no jvd Chest cta bilat CV: RRR no RG Abd:  soft ntnd obese Ext:  1+ tibial edema, excoriations in the right lower leg and right upper leg/ thigh is very tender and mildly  erythematous Access; LIJ TDC in placec/d/i   Imaging: DG Chest 1 View  Result Date: 12/03/2020 CLINICAL DATA:  Leg pain Suspected sepsis EXAM: CHEST  1 VIEW COMPARISON:  12/01/2020 FINDINGS: Evaluation of the chest limited due to expiratory phase of imaging. Unchanged cardiomegaly. Mild pulmonary vascular congestion is unchanged. Left IJ dialysis catheter terminates in the right atrium, unchanged from prior exam. Right chest wall AICD unchanged in configuration. Sternal fixation wires and hardware again seen. Lungs are clear. IMPRESSION: Unchanged cardiomegaly and pulmonary vascular congestion. Electronically Signed   By: Miachel Roux M.D.   On: 12/03/2020 14:45   DG Tibia/Fibula Right  Result Date: 12/03/2020 CLINICAL DATA:  Right leg pain. EXAM: RIGHT TIBIA AND FIBULA - 2 VIEW COMPARISON:  None. FINDINGS: There is no evidence of fracture or other focal bone lesions. Vascular calcifications are noted. IMPRESSION: No acute abnormality is noted. Electronically Signed   By: Marijo Conception M.D.   On: 12/03/2020 14:47   DG Hip Unilat W or Wo Pelvis 2-3 Views Right  Result Date: 12/03/2020 CLINICAL DATA:  Right leg pain. EXAM: DG HIP (WITH OR WITHOUT PELVIS) 2-3V RIGHT COMPARISON:  None. FINDINGS: There is no evidence  of hip fracture or dislocation. There is no evidence of arthropathy or other focal bone abnormality. IMPRESSION: Negative. Electronically Signed   By: Marijo Conception M.D.   On: 12/03/2020 14:46    Labs: BMET Recent Labs  Lab 12/03/20 1311 12/04/20 0412  NA 135 136  K 4.6 4.2  CL 99 99  CO2 19* 23  GLUCOSE 278* 142*  BUN 57* 30*  CREATININE 9.94* 6.10*  CALCIUM 8.3* 8.3*   CBC Recent Labs  Lab 12/03/20 1311 12/04/20 0412  WBC 18.3* 16.2*  NEUTROABS 15.8*  --   HGB 10.9* 10.2*  HCT 33.9* 32.0*  MCV 95.0 94.1  PLT 148* 145*    Medications:    . amiodarone  200 mg Oral Daily  . amLODipine  2.5 mg Oral Daily  . apixaban  2.5 mg Oral BID  . atorvastatin  40 mg  Oral Daily  . calcium acetate  1,334 mg Oral TID WC  . Chlorhexidine Gluconate Cloth  6 each Topical Q0600  . cholestyramine light  4 g Oral BID  . clopidogrel  75 mg Oral Daily  . [START ON 12/06/2020] doxercalciferol  2 mcg Intravenous Q M,W,F-HD  . gabapentin  100 mg Oral BH-qamhs  . heparin sodium (porcine)      . insulin aspart  0-15 Units Subcutaneous TID WC  . insulin aspart  3 Units Subcutaneous TID WC  . insulin glargine  40 Units Subcutaneous QHS  . isosorbide mononitrate  30 mg Oral Daily  . levothyroxine  12.5 mcg Oral QAC breakfast  . metoprolol tartrate  25 mg Oral BID  . pantoprazole  40 mg Oral Daily  . sodium bicarbonate  325 mg Oral BID      Otelia Santee, MD 12/04/2020, 8:22 AM

## 2020-12-04 NOTE — Progress Notes (Signed)
The patient is admiited from ER to room 5 M 11. A & O x 4. Patient refused skin assessment. He indicated no one  should  touch him and that if we do,  he would hit [name]. He endorsed the saff has been lying to him since 7 pm to give him his itchig medication. Paged Dr. Myna Hidalgo and informed him  of his request for itching medication. Received Atarax 25 mg oral twice a day PRN.  He also refused to answer admission questions. Will continue to monitor.

## 2020-12-04 NOTE — Progress Notes (Signed)
   12/04/20 0852  Assess: MEWS Score  Temp 98.2 F (36.8 C)  BP (!) 97/56  Pulse Rate (!) 125  SpO2 (!) 86 %  Assess: MEWS Score  MEWS Temp 0  MEWS Systolic 1  MEWS Pulse 2  MEWS RR 0  MEWS LOC 0  MEWS Score 3  MEWS Score Color Yellow  Assess: if the MEWS score is Yellow or Red  Were vital signs taken at a resting state? Yes  Focused Assessment No change from prior assessment  Early Detection of Sepsis Score *See Row Information* High  MEWS guidelines implemented *See Row Information* No, previously yellow, continue vital signs every 4 hours  Treat  MEWS Interventions Administered scheduled meds/treatments  Pain Scale 0-10  Pain Score 2  Take Vital Signs  Increase Vital Sign Frequency  Yellow: Q 2hr X 2 then Q 4hr X 2, if remains yellow, continue Q 4hrs  Escalate  MEWS: Escalate Yellow: discuss with charge nurse/RN and consider discussing with provider and RRT  Notify: Charge Nurse/RN  Name of Charge Nurse/RN Notified Maryruth Hancock  Date Charge Nurse/RN Notified 12/04/20  Time Charge Nurse/RN Notified [id number]  Notify: Provider  Provider Name/Title Marianne Sofia  Date Provider Notified 12/04/20  Time Provider Notified [phone]  Notification Type Face-to-face  Notification Reason Other (Comment) (patient yellow MEWs on arrival to unit)  Provider response No new orders  Date of Provider Response 12/04/20  Time of Provider Response [phone]  Document  Patient Outcome Other (Comment) (patient will continue to monitor this patient)  Progress note created (see row info) Yes

## 2020-12-04 NOTE — Progress Notes (Signed)
PROGRESS NOTE        PATIENT DETAILS Name: Kyle Walton Age: 65 y.o. Sex: male Date of Birth: 09-17-1955 Admit Date: 12/03/2020 Admitting Physician Neena Rhymes, MD BP:[id number], No Pcp Per (Inactive)  Brief Narrative: Patient is a 65 y.o. male PAF, CAD s/p CABG, AICD implantation, PAD-s/p numerous toe amputations, HTN, ESRD on HD MWF-admitted for right leg soft tissue infection.  See below for further details.  Significant events: 5/27>> admit for RLE pain/right leg cellulitis.  Significant studies: 5/27>> x-ray right foot: No evidence of fracture/focal bone lesions. 5/27>> chest x-ray: Mild pulmonary vascular congestion.  Antimicrobial therapy: None  Microbiology data: 5/27>> Influenza/COVID PCR: Negative 5/27>> blood culture: No growth 5/28>> blood culture: Pending  Procedures : None  Consults: Nephrology  DVT Prophylaxis : apixaban (ELIQUIS) tablet 2.5 mg Start: 12/03/20 2200 apixaban (ELIQUIS) tablet 2.5 mg    Subjective: Continues to have pain in the right lower extremity area.  Assessment/Plan: Sepsis due to RLE cellulitis: Overall stable-essentially unchanged from yesterday-continue vancomycin/cefepime-await culture data.  Have placed a wound care consult.  Wound/leg does not appear surgical on exam.  ESRD on HD MWF: Nephrology following.  HTN: BP on the lower side today-cautiously continue with metoprolol-stop amlodipine/Imdur for now  Chronic diastolic heart failure: Volume status relatively stable-diuresis with HD.  CAD-s/p CABG 2017: No anginal symptoms-on Plavix/statin/beta-blocker  History of [id number] amiodarone, metoprolol and Eliquis.  History of AICD implantation  History of PAD-s/p multiple toe amputations bilaterally  DM-2: CBG stable-continue Lantus 40 units daily, 3 units of NovoLog with meals and SSI.  Follow and adjust  Recent Labs    12/04/20 0834 12/04/20 1112  GLUCAP 142* 147*    Hypothyroidism: Continue Synthroid  OSA: Continue CPAP nightly  Legally blind  Morbid Obesity: Estimated body mass index is 37.31 kg/m as calculated from the following:   Height as of this encounter: '5\' 10"'$  (1.778 m).   Weight as of this encounter: 117.9 kg.    Diet: Diet Order            Diet heart healthy/carb modified Room service appropriate? Yes; Fluid consistency: Thin  Diet effective now                  Code Status: Full code   Family Communication: Spouse at bedside  Disposition Plan: Status is: Inpatient  Remains inpatient appropriate because:Inpatient level of care appropriate due to severity of illness   Dispo: The patient is from: Home              Anticipated d/c is to: Home              Patient currently is not medically stable to d/c.   Difficult to place patient No        Barriers to Discharge:  Antimicrobial agents: Anti-infectives (From admission, onward)   Start     Dose/Rate Route Frequency Ordered Stop   12/04/20 1030  vancomycin (VANCOREADY) IVPB 1000 mg/200 mL        1,000 mg 200 mL/hr over 60 Minutes Intravenous  Once 12/04/20 0930     12/03/20 2300  vancomycin (VANCOREADY) IVPB 1000 mg/200 mL        1,000 mg 200 mL/hr over 60 Minutes Intravenous Every M-W-F (Hemodialysis) 12/03/20 2019     12/03/20 1830  ceFEPIme (MAXIPIME) 1 g in sodium chloride 0.9 %  100 mL IVPB        1 g 200 mL/hr over 30 Minutes Intravenous Every 24 hours 12/03/20 1802     12/03/20 1800  vancomycin (VANCOREADY) IVPB 1000 mg/200 mL  Status:  Discontinued        1,000 mg 200 mL/hr over 60 Minutes Intravenous  Once 12/03/20 1756 12/03/20 1801   12/03/20 1300  vancomycin (VANCOREADY) IVPB 1750 mg/350 mL        1,750 mg 175 mL/hr over 120 Minutes Intravenous  Once 12/03/20 1252 12/03/20 1706   12/03/20 1300  cefTRIAXone (ROCEPHIN) 2 g in sodium chloride 0.9 % 100 mL IVPB        2 g 200 mL/hr over 30 Minutes Intravenous  Once 12/03/20 1252 12/03/20 1345        Time spent: 35 minutes-Greater than 50% of this time was spent in counseling, explanation of diagnosis, planning of further management, and coordination of care.  MEDICATIONS: Scheduled Meds: . amiodarone  200 mg Oral Daily  . apixaban  2.5 mg Oral BID  . atorvastatin  40 mg Oral Daily  . calcium acetate  1,334 mg Oral TID WC  . Chlorhexidine Gluconate Cloth  6 each Topical Q0600  . cholestyramine light  4 g Oral BID  . clopidogrel  75 mg Oral Daily  . [START ON 12/06/2020] doxercalciferol  2 mcg Intravenous Q M,W,F-HD  . gabapentin  100 mg Oral BH-qamhs  . heparin sodium (porcine)      . insulin aspart  0-15 Units Subcutaneous TID WC  . insulin aspart  3 Units Subcutaneous TID WC  . insulin glargine  40 Units Subcutaneous QHS  . isosorbide mononitrate  30 mg Oral Daily  . levothyroxine  12.5 mcg Oral QAC breakfast  . metoprolol tartrate  25 mg Oral BID  . pantoprazole  40 mg Oral Daily  . sodium bicarbonate  325 mg Oral BID   Continuous Infusions: . ceFEPime (MAXIPIME) IV Stopped (12/03/20 2034)  . vancomycin    . vancomycin     PRN Meds:.HYDROcodone-acetaminophen, HYDROmorphone (DILAUDID) injection, hydrOXYzine, nitroGLYCERIN, ondansetron (ZOFRAN) IV   PHYSICAL EXAM: Vital signs: Vitals:   12/04/20 0352 12/04/20 0852 12/04/20 0853 12/04/20 1139  BP: (!) 104/59 (!) 97/56  (!) 94/57  Pulse: (!) 120 (!) 125  (!) 122  Resp:    18  Temp:  98.2 F (36.8 C)  98.3 F (36.8 C)  TempSrc:  Oral  Oral  SpO2:  (!) 86% 92% 94%  Weight:      Height:       Filed Weights   12/03/20 1230  Weight: 117.9 kg   Body mass index is 37.31 kg/m.   Gen Exam:Alert awake-not in any distress HEENT:atraumatic, normocephalic Chest: B/L clear to auscultation anteriorly CVS:S1S2 regular Abdomen:soft non tender, non distended Extremities:see below-no crepitus Neurology: Non focal Skin: no rash      I have personally reviewed following labs and imaging studies  LABORATORY  DATA: CBC: Recent Labs  Lab 12/03/20 1311 12/04/20 0412  WBC 18.3* 16.2*  NEUTROABS 15.8*  --   HGB 10.9* 10.2*  HCT 33.9* 32.0*  MCV 95.0 94.1  PLT 148* 145*    Basic Metabolic Panel: Recent Labs  Lab 12/03/20 1311 12/04/20 0412  NA 135 136  K 4.6 4.2  CL 99 99  CO2 19* 23  GLUCOSE 278* 142*  BUN 57* 30*  CREATININE 9.94* 6.10*  CALCIUM 8.3* 8.3*    GFR: Estimated Creatinine Clearance: 15.5 mL/min (A) (  by C-G formula based on SCr of 6.1 mg/dL (H)).  Liver Function Tests: Recent Labs  Lab 12/03/20 1311  AST 38  ALT 12  ALKPHOS 77  BILITOT 1.3*  PROT 6.8  ALBUMIN 2.9*   No results for input(s): LIPASE, AMYLASE in the last 168 hours. No results for input(s): AMMONIA in the last 168 hours.  Coagulation Profile: Recent Labs  Lab 12/03/20 1311 12/04/20 0412  INR 1.6* 1.5*    Cardiac Enzymes: No results for input(s): CKTOTAL, CKMB, CKMBINDEX, TROPONINI in the last 168 hours.  BNP (last 3 results) No results for input(s): PROBNP in the last 8760 hours.  Lipid Profile: No results for input(s): CHOL, HDL, LDLCALC, TRIG, CHOLHDL, LDLDIRECT in the last 72 hours.  Thyroid Function Tests: No results for input(s): TSH, T4TOTAL, FREET4, T3FREE, THYROIDAB in the last 72 hours.  Anemia Panel: No results for input(s): VITAMINB12, FOLATE, FERRITIN, TIBC, IRON, RETICCTPCT in the last 72 hours.  Urine analysis: No results found for: COLORURINE, APPEARANCEUR, LABSPEC, PHURINE, GLUCOSEU, HGBUR, BILIRUBINUR, KETONESUR, PROTEINUR, UROBILINOGEN, NITRITE, LEUKOCYTESUR  Sepsis Labs: Lactic Acid, Venous    Component Value Date/Time   LATICACIDVEN 1.9 12/03/2020 1444    MICROBIOLOGY: Recent Results (from the past 240 hour(s))  Culture, blood (Routine x 2)     Status: None (Preliminary result)   Collection Time: 12/03/20 12:32 PM   Specimen: BLOOD  Result Value Ref Range Status   Specimen Description BLOOD RIGHT ANTECUBITAL  Final   Special Requests   Final     BOTTLES DRAWN AEROBIC AND ANAEROBIC Blood Culture adequate volume   Culture   Final    NO GROWTH < 24 HOURS Performed at [facility], [address], [place], [place] [address]    Report Status PENDING  Incomplete  Resp Panel by RT-PCR (Flu A&B, Covid) Nasopharyngeal Swab     Status: None   Collection Time: 12/03/20 12:52 PM   Specimen: Nasopharyngeal Swab; Nasopharyngeal(NP) swabs in vial transport medium  Result Value Ref Range Status   SARS Coronavirus 2 by RT PCR NEGATIVE NEGATIVE Final    Comment: (NOTE) SARS-CoV-2 target nucleic acids are NOT DETECTED.  The SARS-CoV-2 RNA is generally detectable in upper respiratory specimens during the acute phase of infection. The lowest concentration of SARS-CoV-2 viral copies this assay can detect is 138 copies/mL. A negative result does not preclude SARS-Cov-2 infection and should not be used as the sole basis for treatment or other patient management decisions. A negative result may occur with  improper specimen collection/handling, submission of specimen other than nasopharyngeal swab, presence of viral mutation(s) within the areas targeted by this assay, and inadequate number of viral copies(<138 copies/mL). A negative result must be combined with clinical observations, patient history, and epidemiological information. The expected result is Negative.  Fact Sheet for Patients:  EntrepreneurPulse.com.au  Fact Sheet for Healthcare Providers:  IncredibleEmployment.be  This test is no t yet approved or cleared by the [place] FDA and  has been authorized for detection and/or diagnosis of SARS-CoV-2 by FDA under an Emergency Use Authorization (EUA). This EUA will remain  in effect (meaning this test can be used) for the duration of the COVID-19 declaration under Section 564(b)(1) of the Act, 21 U.S.C.section 360bbb-3(b)(1), unless the authorization is terminated  or revoked sooner.        Influenza A by PCR NEGATIVE NEGATIVE Final   Influenza B by PCR NEGATIVE NEGATIVE Final    Comment: (NOTE) The Xpert Xpress SARS-CoV-2/FLU/RSV plus assay is  intended as an aid in the diagnosis of influenza from Nasopharyngeal swab specimens and should not be used as a sole basis for treatment. Nasal washings and aspirates are unacceptable for Xpert Xpress SARS-CoV-2/FLU/RSV testing.  Fact Sheet for Patients: EntrepreneurPulse.com.au  Fact Sheet for Healthcare Providers: IncredibleEmployment.be  This test is not yet approved or cleared by the [place] FDA and has been authorized for detection and/or diagnosis of SARS-CoV-2 by FDA under an Emergency Use Authorization (EUA). This EUA will remain in effect (meaning this test can be used) for the duration of the COVID-19 declaration under Section 564(b)(1) of the Act, 21 U.S.C. section 360bbb-3(b)(1), unless the authorization is terminated or revoked.  Performed at [facility], [place] [address], [place], [place] [address]     RADIOLOGY STUDIES/RESULTS: DG Chest 1 View  Result Date: 12/03/2020 CLINICAL DATA:  Leg pain Suspected sepsis EXAM: CHEST  1 VIEW COMPARISON:  12/01/2020 FINDINGS: Evaluation of the chest limited due to expiratory phase of imaging. Unchanged cardiomegaly. Mild pulmonary vascular congestion is unchanged. Left IJ dialysis catheter terminates in the right atrium, unchanged from prior exam. Right chest wall AICD unchanged in configuration. Sternal fixation wires and hardware again seen. Lungs are clear. IMPRESSION: Unchanged cardiomegaly and pulmonary vascular congestion. Electronically Signed   By: Miachel Roux M.D.   On: 12/03/2020 14:45   DG Tibia/Fibula Right  Result Date: 12/03/2020 CLINICAL DATA:  Right leg pain. EXAM: RIGHT TIBIA AND FIBULA - 2 VIEW COMPARISON:  None. FINDINGS: There is no evidence of fracture or other focal bone lesions. Vascular calcifications  are noted. IMPRESSION: No acute abnormality is noted. Electronically Signed   By: Marijo Conception M.D.   On: 12/03/2020 14:47   DG Hip Unilat W or Wo Pelvis 2-3 Views Right  Result Date: 12/03/2020 CLINICAL DATA:  Right leg pain. EXAM: DG HIP (WITH OR WITHOUT PELVIS) 2-3V RIGHT COMPARISON:  None. FINDINGS: There is no evidence of hip fracture or dislocation. There is no evidence of arthropathy or other focal bone abnormality. IMPRESSION: Negative. Electronically Signed   By: Marijo Conception M.D.   On: 12/03/2020 14:46     LOS: 1 day   Oren Binet, MD  [facility]    To contact the attending provider between 7A-7P or the covering provider during after hours 7P-7A, please log into the web site [email or web address] and access using universal [place] password for that web site. If you do not have the password, please call the hospital operator.  12/04/2020, 12:44 PM

## 2020-12-04 NOTE — Progress Notes (Signed)
Patient non compliant with RN education to wear nasal cannula oxygen. RN will continue to monitor this patient.

## 2020-12-05 ENCOUNTER — Inpatient Hospital Stay (HOSPITAL_COMMUNITY): Payer: Medicare HMO

## 2020-12-05 DIAGNOSIS — L03115 Cellulitis of right lower limb: Secondary | ICD-10-CM | POA: Diagnosis not present

## 2020-12-05 DIAGNOSIS — N186 End stage renal disease: Secondary | ICD-10-CM | POA: Diagnosis not present

## 2020-12-05 DIAGNOSIS — E1151 Type 2 diabetes mellitus with diabetic peripheral angiopathy without gangrene: Secondary | ICD-10-CM | POA: Diagnosis not present

## 2020-12-05 DIAGNOSIS — L538 Other specified erythematous conditions: Secondary | ICD-10-CM

## 2020-12-05 DIAGNOSIS — M7989 Other specified soft tissue disorders: Secondary | ICD-10-CM

## 2020-12-05 DIAGNOSIS — I5032 Chronic diastolic (congestive) heart failure: Secondary | ICD-10-CM | POA: Diagnosis not present

## 2020-12-05 LAB — RENAL FUNCTION PANEL
Albumin: 2.4 g/dL — ABNORMAL LOW (ref 3.5–5.0)
Anion gap: 16 — ABNORMAL HIGH (ref 5–15)
BUN: 43 mg/dL — ABNORMAL HIGH (ref 8–23)
CO2: 20 mmol/L — ABNORMAL LOW (ref 22–32)
Calcium: 8.4 mg/dL — ABNORMAL LOW (ref 8.9–10.3)
Chloride: 100 mmol/L (ref 98–111)
Creatinine, Ser: 7.54 mg/dL — ABNORMAL HIGH (ref 0.61–1.24)
GFR, Estimated: 7 mL/min — ABNORMAL LOW (ref >60)
Glucose, Bld: 119 mg/dL — ABNORMAL HIGH (ref 70–99)
Phosphorus: 6.9 mg/dL — ABNORMAL HIGH (ref 2.5–4.6)
Potassium: 4.6 mmol/L (ref 3.5–5.1)
Sodium: 136 mmol/L (ref 135–145)

## 2020-12-05 LAB — GLUCOSE, CAPILLARY
Glucose-Capillary: 121 mg/dL — ABNORMAL HIGH (ref 70–99)
Glucose-Capillary: 182 mg/dL — ABNORMAL HIGH (ref 70–99)
Glucose-Capillary: 165 mg/dL — ABNORMAL HIGH (ref 70–99)
Glucose-Capillary: 193 mg/dL — ABNORMAL HIGH (ref 70–99)

## 2020-12-05 LAB — CBC
HCT: 30.9 % — ABNORMAL LOW (ref 39.0–52.0)
Hemoglobin: 9.8 g/dL — ABNORMAL LOW (ref 13.0–17.0)
MCH: 29.8 pg (ref 26.0–34.0)
MCHC: 31.7 g/dL (ref 30.0–36.0)
MCV: 93.9 fL (ref 80.0–100.0)
Platelets: 133 10*3/uL — ABNORMAL LOW (ref 150–400)
RBC: 3.29 MIL/uL — ABNORMAL LOW (ref 4.22–5.81)
RDW: 16.6 % — ABNORMAL HIGH (ref 11.5–15.5)
WBC: 17.3 10*3/uL — ABNORMAL HIGH (ref 4.0–10.5)
nRBC: 0.2 % (ref 0.0–0.2)

## 2020-12-05 LAB — BLOOD GAS, ARTERIAL
Acid-base deficit: 4.1 mmol/L — ABNORMAL HIGH (ref 0.0–2.0)
Bicarbonate: 21.5 mmol/L (ref 20.0–28.0)
Drawn by: 164
FIO2: 21
O2 Saturation: 82.8 %
Patient temperature: 37
pCO2 arterial: 46 mmHg (ref 32.0–48.0)
pH, Arterial: 7.29 — ABNORMAL LOW (ref 7.350–7.450)
pO2, Arterial: 54.2 mmHg — ABNORMAL LOW (ref 83.0–108.0)

## 2020-12-05 LAB — HEPATITIS B SURFACE ANTIBODY, QUANTITATIVE: Hep B S AB Quant (Post): <3.1 m[IU]/mL — ABNORMAL LOW (ref >9.9)

## 2020-12-05 IMAGING — DX DG CHEST 1V PORT
1 series · 2 of 2 positions shown · non-contrast
Comparison: [DATE]

CLINICAL DATA: Shortness of breath

EXAM:
PORTABLE CHEST 1 VIEW

[Series 1: chest ap · 0.14mm/px · 2 of 2 slices shown]
[im 1/2]
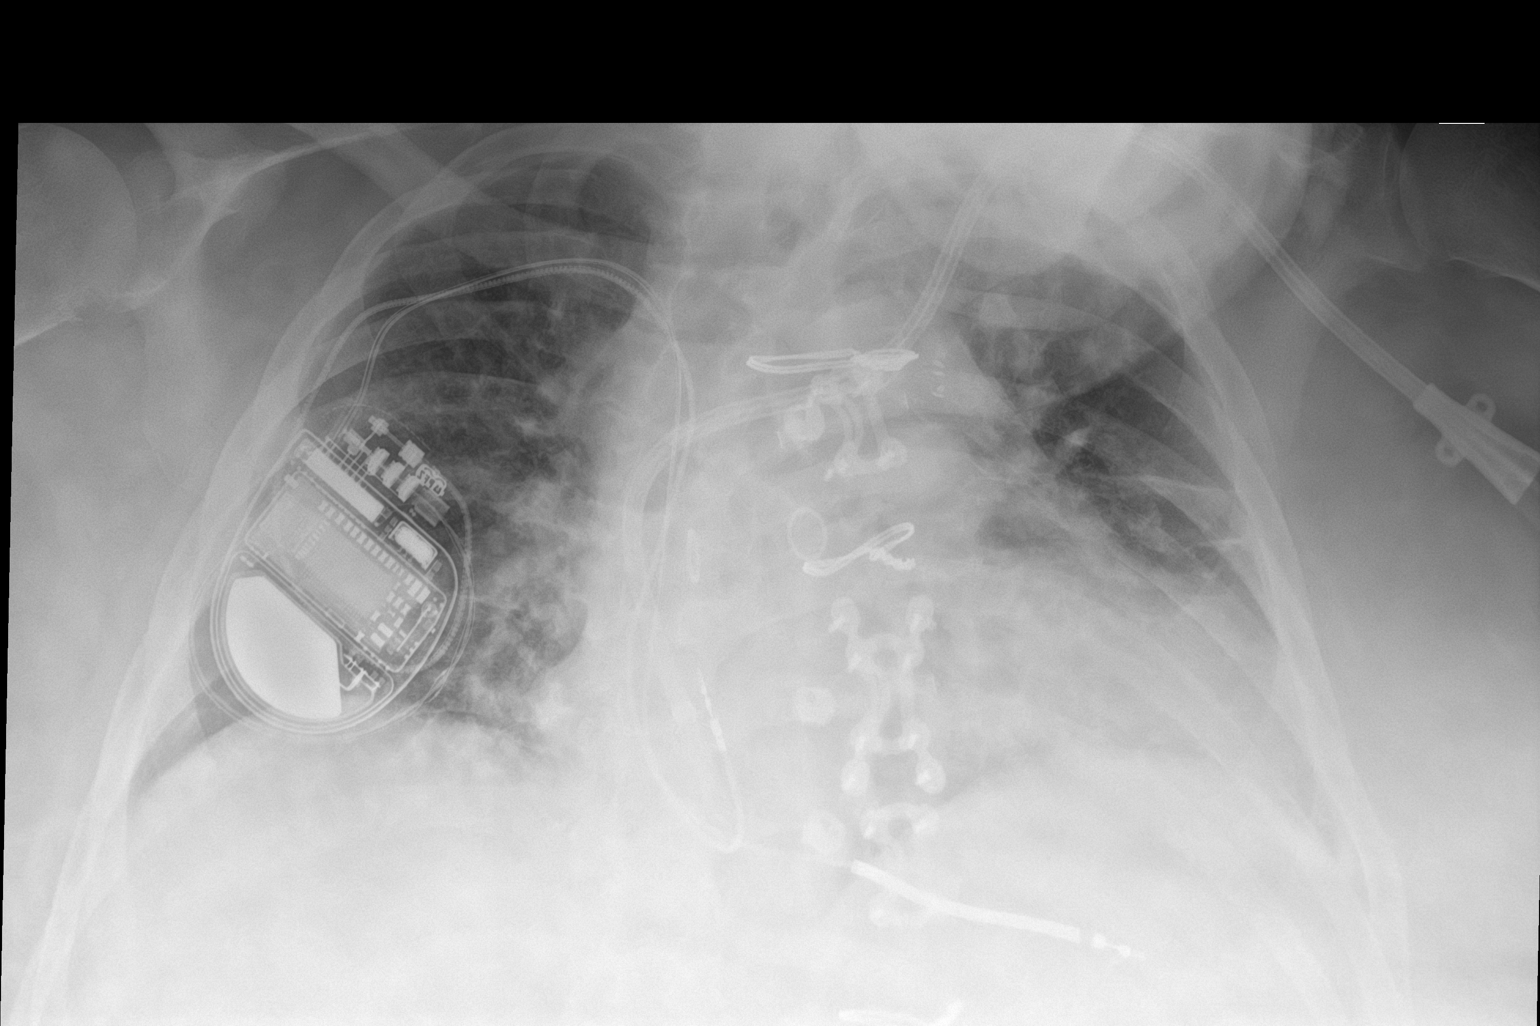
[im 2/2]
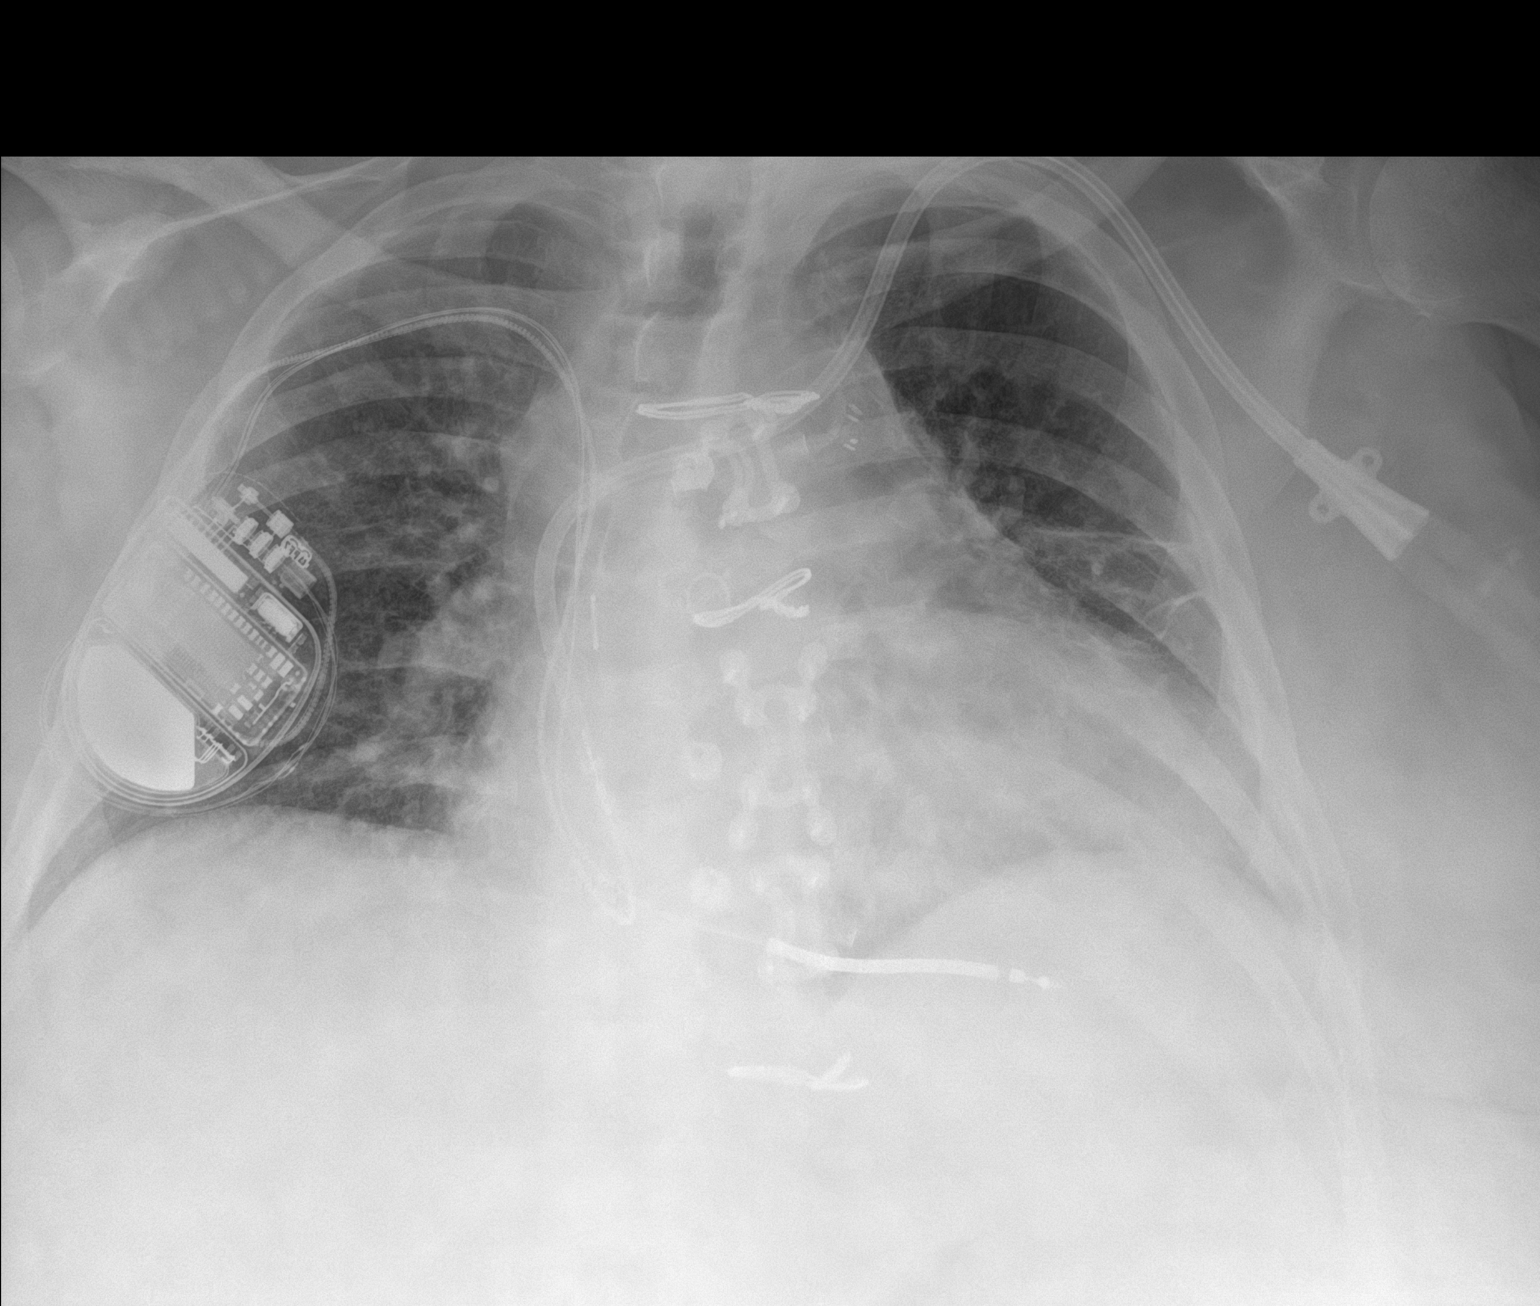

[2 of 2 positions shown; findings below may reference images not displayed]

FINDINGS: The cardiomediastinal silhouette is unchanged and enlarged in
contour.RIGHT chest AICD. Status post median sternotomy and CABG.
LEFT IJ CVC tip terminates over the RIGHT atrium. No pleural
effusion. No pneumothorax. Scattered linear opacities consistent
with atelectasis. Perihilar vascular fullness without overt edema.
Visualized abdomen is unremarkable. Multilevel degenerative changes
of the thoracic spine.
IMPRESSION: Cardiomegaly with pulmonary vascular congestion without overt edema.

## 2020-12-05 IMAGING — CT CT FEMUR *R* W/O CM
3 series · 10 of 35 positions shown, 12 images · non-contrast
Comparison: None.

CLINICAL DATA: Limping, right leg pain

EXAM:
CT OF THE LOWER RIGHT EXTREMITY WITHOUT CONTRAST
TECHNIQUE: Multidetector CT imaging of the right lower extremity was performed
according to the standard protocol.

[Series 5: extremity soft tissue · axial · 0.57mm/px · z∈[-597,-323]mm · 2 of 298 slices shown, 3 images]
[im 92/298  soft-tissue]
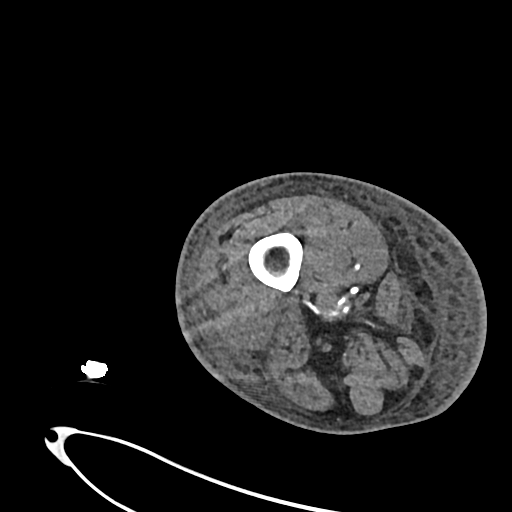
[im 92/298  bone]
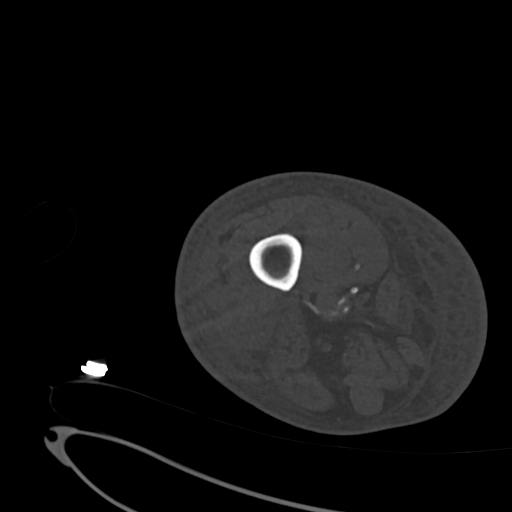
[im 229/298  bone]
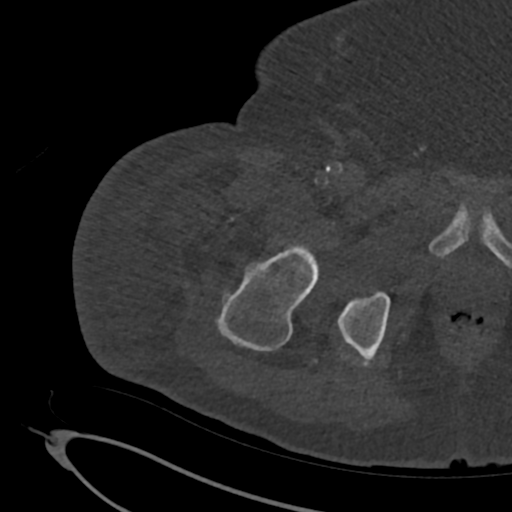

[Series 9: cor soft tissue · coronal · 0.59mm/px · 3 of 178 slices shown]
[im 36/178  bone]
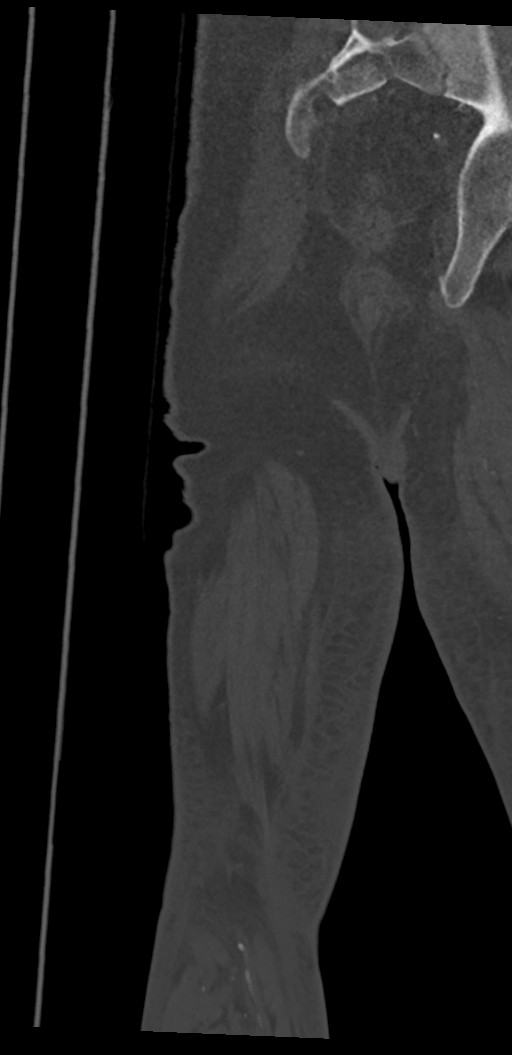
[im 71/178  bone]
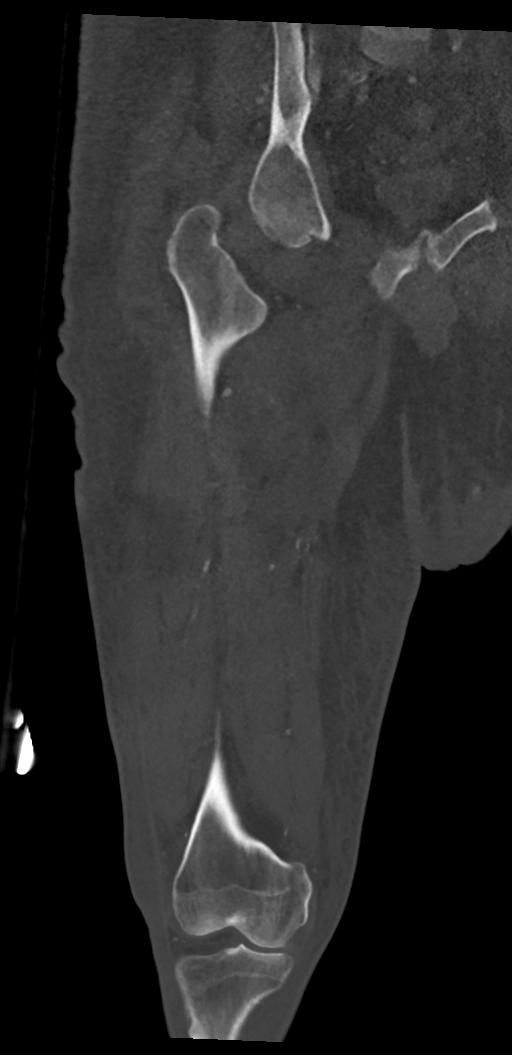
[im 107/178  bone]
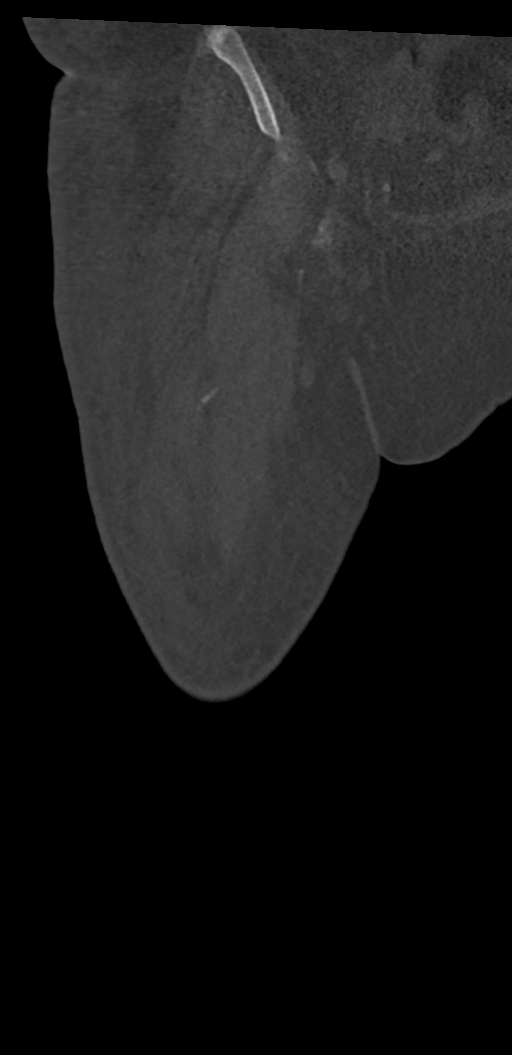

[Series 10: sag soft tissue · sagittal · 0.69mm/px · 5 of 151 slices shown, 6 images]
[im 51/151  bone]
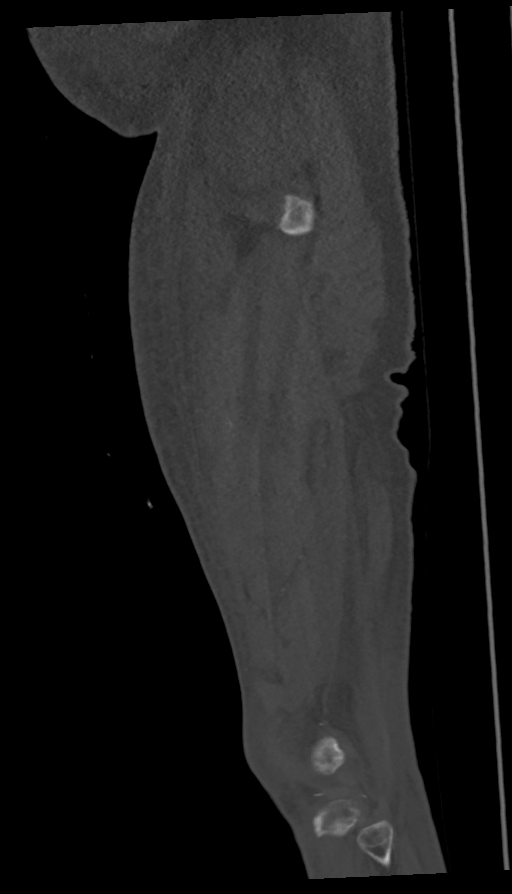
[im 63/151  bone]
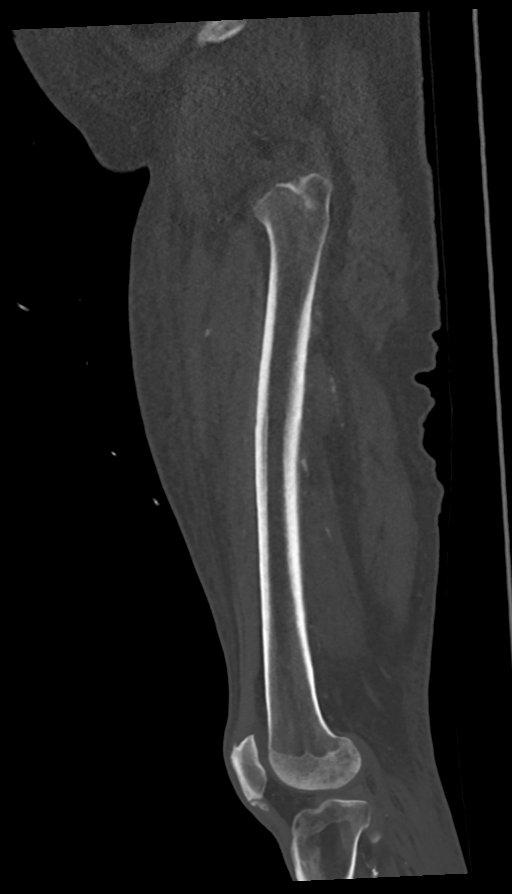
[im 76/151  soft-tissue]
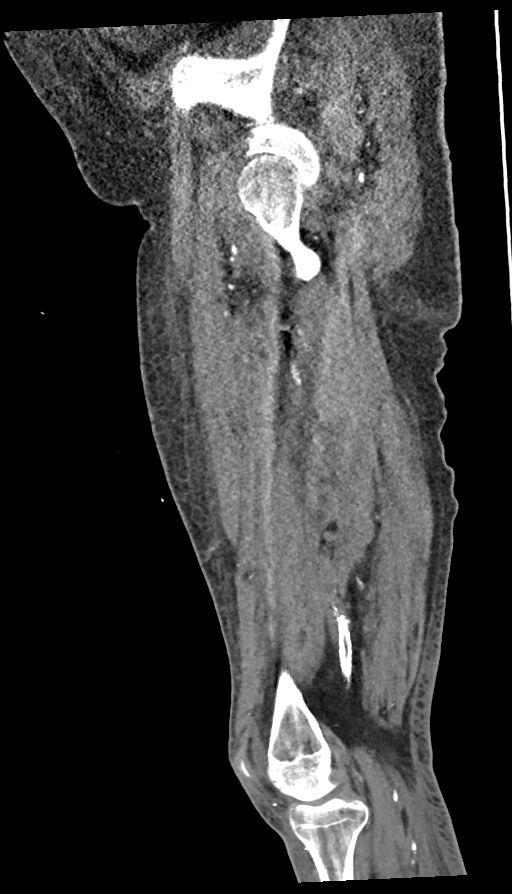
[im 76/151  bone]
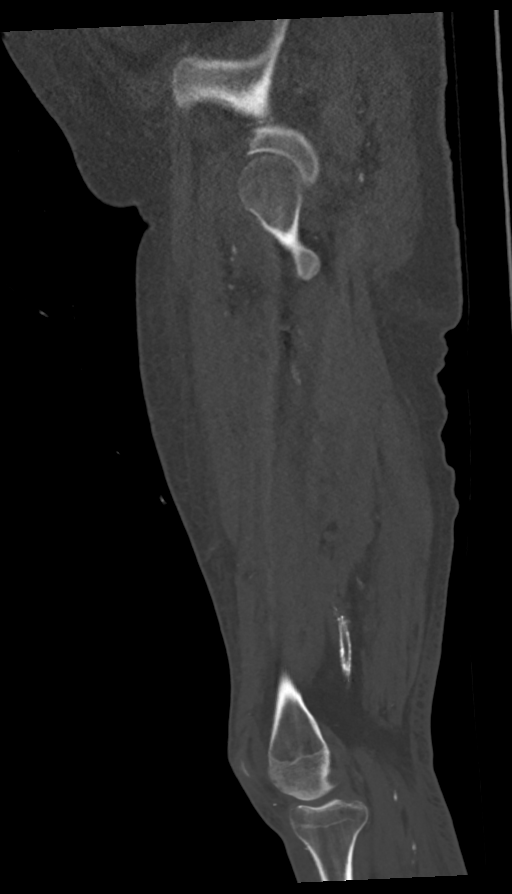
[im 88/151  bone]
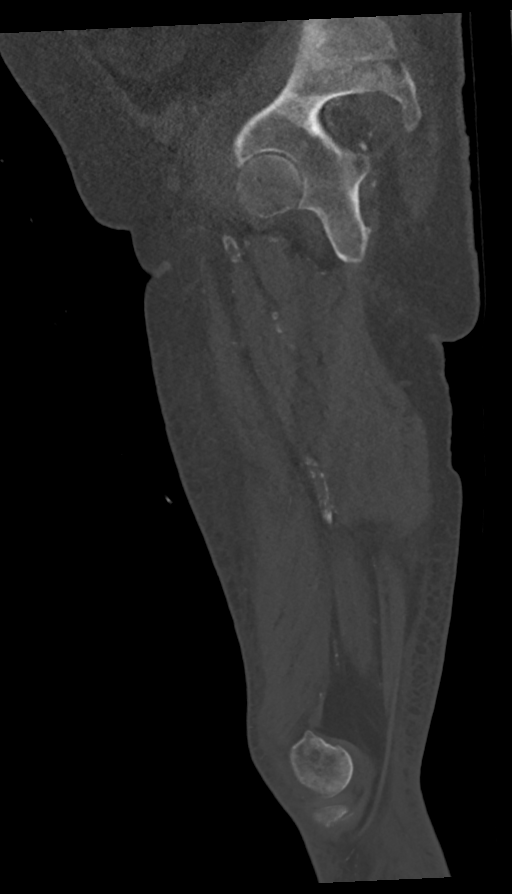
[im 101/151  bone]
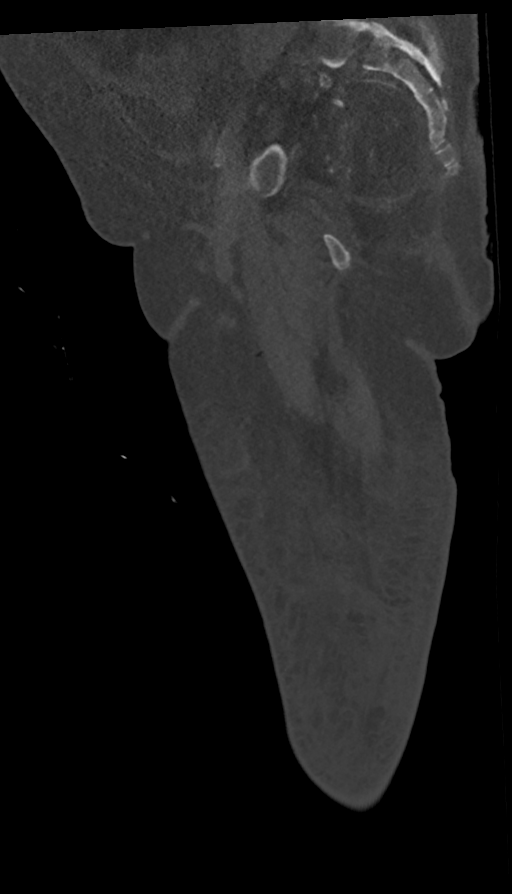

[10 of 35 positions shown; findings below may reference images not displayed]

FINDINGS: Bones/Joint/Cartilage

No fracture or dislocation. Normal alignment. No joint effusion.
Left hip joint space is maintained. Knee joint spaces are
maintained. Degenerative changes of the pubic symphysis. Mild
osteoarthritis of the right SI joint.

Ligaments

Ligaments are suboptimally evaluated by CT.

Muscles and Tendons
Muscles are normal.  No intramuscular fluid collection or hematoma.

Soft tissue
No fluid collection or hematoma. No soft tissue mass. Soft tissue
edema and skin thickening along the lateral aspect of the upper
right thigh and circumferential subcutaneous edema throughout the
remainder of the thigh extending into the lower leg. Extensive
peripheral vascular atherosclerotic disease.
IMPRESSION: 1.  No acute osseous injury of the right femur.
2. Soft tissue edema and skin thickening along the lateral aspect of
the upper right thigh and circumferential subcutaneous edema
throughout the remainder of the thigh extending into the lower leg.
This likely reflects reactive edema secondary to fluid overload and
less likely cellulitis.
3. No drainable fluid collections.

## 2020-12-05 MED ORDER — TRAMADOL HCL 50 MG PO TABS
25.0000 mg | ORAL_TABLET | Freq: Once | ORAL | Status: AC
  Administered 2020-12-05: 25 mg via ORAL
  Filled 2020-12-05: qty 1

## 2020-12-05 MED ORDER — GABAPENTIN 100 MG PO CAPS
100.0000 mg | ORAL_CAPSULE | ORAL | Status: DC
  Administered 2020-12-06: 100 mg via ORAL
  Filled 2020-12-05: qty 1

## 2020-12-05 MED ORDER — MIDODRINE HCL 5 MG PO TABS
5.0000 mg | ORAL_TABLET | Freq: Three times a day (TID) | ORAL | Status: DC
  Administered 2020-12-05: 5 mg via ORAL
  Filled 2020-12-05: qty 1

## 2020-12-05 MED ORDER — OXYCODONE HCL 5 MG PO TABS: 10.0000 mg | ORAL_TABLET | ORAL | Status: DC | PRN

## 2020-12-05 MED ORDER — NALOXONE HCL 0.4 MG/ML IJ SOLN
INTRAMUSCULAR | Status: AC
  Filled 2020-12-05: qty 1

## 2020-12-05 MED ORDER — HYDROMORPHONE HCL 1 MG/ML IJ SOLN: 0.2500 mg | INTRAMUSCULAR | Status: DC | PRN

## 2020-12-05 MED ORDER — NALOXONE HCL 0.4 MG/ML IJ SOLN
0.4000 mg | INTRAMUSCULAR | Status: DC | PRN
  Administered 2020-12-05: 0.4 mg via INTRAVENOUS

## 2020-12-05 MED ORDER — ACETAMINOPHEN 500 MG PO TABS
1000.0000 mg | ORAL_TABLET | Freq: Three times a day (TID) | ORAL | Status: DC
  Administered 2020-12-05 – 2020-12-20 (×42): 1000 mg via ORAL
  Filled 2020-12-05 (×42): qty 2

## 2020-12-05 MED ORDER — HYDROMORPHONE HCL 1 MG/ML IJ SOLN: 0.5000 mg | INTRAMUSCULAR | Status: DC | PRN

## 2020-12-05 NOTE — Evaluation (Signed)
Occupational Therapy Evaluation Patient Details Name: Kyle Walton MRN: [id number] DOB: 01-May-1956 Today's Date: 12/05/2020    History of Present Illness Pt is a 65 y.o. male who presented 5/27 with R leg cellulitis and sepsis. PMH: PAF, OSA, MI, ESRD on HD MWF, DM on insulin, s/p multiple toe amputations, morbid obesity, and CAD.   Clinical Impression   Pt admitted to the ED for concerns listed above. Pt was confused and lethargic throughout session with OT/PT, therefore home set up and prior level of functioning may be inaccurate. Per Pt he was walking a few weeks ago with no DME and slowly required a walker then a WC. He lives in a 2nd floor apartment, which he states he never leaves due to the stairs. During the session pt presented with R sided inattention, having difficulty tracking and maintaining gaze to the Rside. Additionally pt did not use his R arm without max verbal and tactile cueing to do so, which then was limited and choppy movements. RN and MD notified. Pt required total assist for all bed mobility and deferred OOB transfers due to lethargy, confusion, and pain. Pt will need skilled therapies prior to going home to address functinal mobility and ADL performance. Acutely OT will follow to address all concerns and deficits listed below.    Follow Up Recommendations  SNF;Supervision/Assistance - 24 hour    Equipment Recommendations  3 in 1 bedside commode;Tub/shower bench;Wheelchair (measurements OT);Wheelchair cushion (measurements OT)    Recommendations for Other Services       Precautions / Restrictions Precautions Precautions: Fall Precaution Comments: contact precautions; CPAP Restrictions Weight Bearing Restrictions: No      Mobility Bed Mobility Overal bed mobility: Needs Assistance Bed Mobility: Supine to Sit;Sit to Supine     Supine to sit: HOB elevated;Total assist;+2 for physical assistance;+2 for safety/equipment Sit to supine: Total assist;+2 for  physical assistance;+2 for safety/equipment   General bed mobility comments: Max multi-modal simple cues provided to have pt assist with leg movement towards EOB or to pull trunk to ascend, min success thus TAx2 for bed mobility.    Transfers                 General transfer comment: Deferred as pt lethargic and extreme pain in R leg    Balance Overall balance assessment: Needs assistance Sitting-balance support: Bilateral upper extremity supported;Feet supported Sitting balance-Leahy Scale: Zero Sitting balance - Comments: Pt with strong posterior and L lateral lean, reporting he felt like he was falling to the R. Pt needing max-TA initially but progressed to modA by the end for brief periods before needing maxA again. Bil UEs on rails with cues to correct posture, min success. Postural control: Posterior lean;Left lateral lean     Standing balance comment: Deferred as pt lethargic and extreme pain in R leg                           ADL either performed or assessed with clinical judgement   ADL Overall ADL's : Needs assistance/impaired Eating/Feeding: Maximal assistance;Bed level Eating/Feeding Details (indicate cue type and reason): Pt needs assist with feeding himself due to lethargy and confusion. Grooming: Wash/dry hands;Wash/dry face;Bed level;Maximal assistance Grooming Details (indicate cue type and reason): Pt needs assist due to lethargy and confusion Upper Body Bathing: Total assistance;Bed level   Lower Body Bathing: Total assistance;+2 for physical assistance;+2 for safety/equipment;Bed level Lower Body Bathing Details (indicate cue type and reason): +2 to assist  with rolling/bed mobility Upper Body Dressing : Maximal assistance;Bed level   Lower Body Dressing: Total assistance;+2 for physical assistance;+2 for safety/equipment   Toilet Transfer: Total assistance;+2 for physical assistance;+2 for safety/equipment Toilet Transfer Details (indicate cue  type and reason): Pt unable to transfer OOB at this time Toileting- Clothing Manipulation and Hygiene: Total assistance;+2 for physical assistance;+2 for safety/equipment;Bed level   Tub/ Shower Transfer: Total assistance;+2 for physical assistance;+2 for safety/equipment Tub/Shower Transfer Details (indicate cue type and reason): Pt unable to transfer OOB at this time Functional mobility during ADLs: Total assistance;+2 for physical assistance;+2 for safety/equipment General ADL Comments: Pt requires total to mod assist to maintain upright posture seated EOB, pt unable to participate in ADL's due to weakness and confusion at this time.     Vision   Additional Comments: Unable to assess     Perception Perception Perception Tested?: No   Praxis Praxis Praxis tested?: Not tested    Pertinent Vitals/Pain Pain Assessment: Faces Faces Pain Scale: Hurts whole lot Pain Location: R leg Pain Descriptors / Indicators: Grimacing;Guarding;Moaning Pain Intervention(s): Limited activity within patient's tolerance;Monitored during session;Repositioned     Hand Dominance Right   Extremity/Trunk Assessment Upper Extremity Assessment Upper Extremity Assessment: Generalized weakness;RUE deficits/detail RUE Deficits / Details: Pt not moving RUE without max tactile and verbal cueing. Appears similar to R sided inattention. With PROM pt has functional mobility. RUE Coordination: decreased fine motor;decreased gross motor   Lower Extremity Assessment Lower Extremity Assessment: Defer to PT evaluation RLE Deficits / Details: Generalized weakness with ROM deficits, limited assessment due to pain and lethargy RLE Coordination: decreased gross motor LLE Deficits / Details: Generalized weakness with ROM deficits, limited assessment due to lethargy LLE Coordination: decreased gross motor   Cervical / Trunk Assessment Cervical / Trunk Assessment: Kyphotic   Communication Communication Communication:  No difficulties   Cognition Arousal/Alertness: Lethargic Behavior During Therapy: WFL for tasks assessed/performed Overall Cognitive Status: No family/caregiver present to determine baseline cognitive functioning                                 General Comments: Pt very lethargic, thus difficult to assess cognition. However, upon entry pt was stating "I owe you my life" and "you have done so much for me" despite assuring pt that we had not met before. Pt with difficulty following commands, sequencing, and maintaining attention, likely due to lethargy.   General Comments  Pt with poor tracking of eyes to R with inability to cross midline (noted possible nystagmus when attempting) but able to track to L; pt with inattention to R side also; RN and MD notified; SpO2 100% on CPAP    Exercises     Shoulder Instructions      Home Living Family/patient expects to be discharged to:: Private residence Living Arrangements: Spouse/significant other Available Help at Discharge: Family Type of Home: Apartment Home Access: Stairs to enter Technical brewer of Steps: flight   Home Layout: One level               Home Equipment: Environmental consultant - 4 wheels;Electric scooter;Cane - single point;Bedside commode   Additional Comments: unsure how much info is accurate as pt lethargic and confused and likely poor historian      Prior Functioning/Environment Level of Independence: Independent with assistive device(s)                 OT Problem List: Decreased strength;Decreased range  of motion;Decreased activity tolerance;Impaired balance (sitting and/or standing);Decreased coordination;Decreased cognition;Decreased safety awareness;Decreased knowledge of use of DME or AE;Obesity;Impaired UE functional use;Pain      OT Treatment/Interventions: Self-care/ADL training;Therapeutic exercise;Energy conservation;Neuromuscular education;DME and/or AE instruction;Therapeutic  activities;Cognitive remediation/compensation;Balance training;Patient/family education    OT Goals(Current goals can be found in the care plan section) Acute Rehab OT Goals Patient Stated Goal: to assist [name] OT Goal Formulation: With patient Time For Goal Achievement: 12/19/20 Potential to Achieve Goals: Good ADL Goals Pt Will Transfer to Toilet: with mod assist;with transfer board;bedside commode Additional ADL Goal #1: Pt will complete bed mobility including rolling with min assist, to relieve pressure and ease caregiver burden Additional ADL Goal #2: Pt will sit EOB with min support to maintain upright position for 3 mins to prepare for seated ADL's.  OT Frequency: Min 2X/week   Barriers to D/C: Inaccessible home environment  Pt lives up a flight of stairs.       Co-evaluation PT/OT/SLP Co-Evaluation/Treatment: Yes Reason for Co-Treatment: For patient/therapist safety;To address functional/ADL transfers PT goals addressed during session: Mobility/safety with mobility;Balance OT goals addressed during session: ADL's and self-care;Strengthening/ROM      AM-PAC OT "6 Clicks" Daily Activity     Outcome Measure Help from another person eating meals?: A Lot Help from another person taking care of personal grooming?: Total Help from another person toileting, which includes using toliet, bedpan, or urinal?: Total Help from another person bathing (including washing, rinsing, drying)?: Total Help from another person to put on and taking off regular upper body clothing?: A Lot Help from another person to put on and taking off regular lower body clothing?: Total 6 Click Score: 8   End of Session Nurse Communication: Mobility status  Activity Tolerance: Patient limited by lethargy;Patient limited by pain Patient left: in bed;with call bell/phone within reach;with bed alarm set  OT Visit Diagnosis: Unsteadiness on feet (R26.81);Other abnormalities of gait and mobility (R26.89);Muscle  weakness (generalized) (M62.81)                Time: 2820-8138 OT Time Calculation (min): 30 min Charges:  OT General Charges $OT Visit: 1 Visit OT Evaluation $OT Eval Moderate Complexity: [place]., OTR/L Acute Rehabilitation  Jaelah Hauth Elane Yolanda Bonine 12/05/2020, 6:25 PM

## 2020-12-05 NOTE — Progress Notes (Addendum)
Revaluation:  Sleepy while on CPAP-but awakes-able to converse-speech slow but clear. But after a minute or so of talking-dozes back off to sleep. He has strong grips-moves all 4 ext-RLE limited to due to pain.   Improving encephalopathy (clearly better than earlier)  Will check ABG Minimize narcotics as much as possible Move to progressive for closer monitoring.   Voicemail left for spouse

## 2020-12-05 NOTE — Progress Notes (Signed)
Patient returned back from CT lethargic, patient was not responding to RN, RN took vital signs and oxygen saturation was at 70% on room air, RN alerted MD, rapid response team, respiratory, and charge nurse who all arrived at patient bedside. Patient was stabilized on CPAP, 0.'4mg'$  of narcan IV, patient responded to pain and vital signs were retaken and charted at 1203. RN will continue to monitor this patient.

## 2020-12-05 NOTE — Consult Note (Signed)
WOC Nurse Consult Note: Reason for Consult: Right pretibial wounds, full thickness, consistent with arterial insufficiency.  Wound type:areterial insufficiency, possible infectious, cannot rule out autoimmune Pressure Injury POA:N/A Measurement: To be obtained by bedside RN and documented on Nursing Flow Sheet Wound bed: Red, dry with thin yellow slough. Punctate. Drainage (amount, consistency, odor) scant Periwound:erythema, mild edema, no hair growth, areas of dried serum (scabbing) Dressing procedure/placement/frequency: Conservative antimicrobial nonadherent dressings will be placed twice daily after cleansing. Pressure injury prevention via a pressure redistribution heel boot is requested. Systemic antibiotics are in place. 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  [place] nursing team will not follow, but will remain available to this patient, the nursing and medical teams.  Please re-consult if needed. Thanks, Maudie Flakes, MSN, RN, [place], Arther Abbott  Pager# [phone]

## 2020-12-05 NOTE — Significant Event (Signed)
Rapid Response Event Note   Reason for Call :  Lethargy and decreased oxygen saturation  Initial Focused Assessment:  Called by RN stating that her patient had a decreased oxygen saturation and was also hard to arouse. RN stated that the patient had an O2 sat in the 70's. Patient was very difficult to arouse. He was only arousable to pain. RT placed patient back on his CPAP, which was bedside.  The patient did receive PO pain medication at 0700 and IV dilaudid at 0911.   Interventions:  Vitals taken  0.'4mg'$  of narcan given  Plan of Care:  Patient will remain on 62M. If the patient gets very lethargic another dose of narcan can be given.   Event Summary:   MD Notified: Ghimire MD Call Time: [place] Time: [id number] End Time: [place], RN

## 2020-12-05 NOTE — Progress Notes (Signed)
RN attempted to call report to receiving nurse.

## 2020-12-05 NOTE — Progress Notes (Addendum)
RN given report and ready to transfer patient and place him on cardiac monitoring per MD order, RN contacted SWOT at 1747 to assist to transfer, however patient refused when RN and SWOT arrived to room becoming irritable and agitated insisting that he wants to be left alone to talk to his wife on the phone and eat his food. RN educated patient on the consequences on talking and eating at the same time, patient became increasingly more agitated. RN checked oxygen saturation on room air and it was 89%, patient continued to refuse nasal cannula while eating and talking on the phone. RN will continue to monitor and will notify charge nurse.  Update patient agreed to be transferred to 4E Rm.09 at 1850 on RN 2nd attempt to transfer patient. Patient's belongings and CPAP machine were transferred with the patient.

## 2020-12-05 NOTE — Evaluation (Signed)
Physical Therapy Evaluation Patient Details Name: Kyle Walton MRN: [id number] DOB: [birth year] Today's Date: 12/05/2020   History of Present Illness  Pt is a 65 y.o. male who presented 5/27 with R leg cellulitis and sepsis. PMH: PAF, OSA, MI, ESRD on HD MWF, DM on insulin, s/p multiple toe amputations, morbid obesity, and CAD.    Clinical Impression  Pt presents with condition above and deficits mentioned below, see PT Problem List. Pt appeared confused this date and was very lethargic, thus pt's report on PLOF and home set-up may be inaccurate, will need to confirm at a later date. Per pt, he is mod I living in a 2nd floor apartment that does not have an elevator. Currently, pt demonstrates R inattention, difficulty tracking past midline to his R with his eyes, balance deficits (thinks he is falling to the R), cognition deficits, and overall generalized weakness and ROM deficits. RN and MD notified of these concerning deficits. Pt needing TAx2 for all bed mobility this date. Deferred attempting to transfers as pt was very lethargic, having difficulty following commands or sequencing, and experiencing severe pain/sensativity in his R lower extremity. Recommend follow-up therapy in a SNF at this time. Will continue to follow acutely.    Follow Up Recommendations SNF;Supervision/Assistance - 24 hour    Equipment Recommendations  3in1 (PT);Wheelchair (measurements PT);Wheelchair cushion (measurements PT);Hospital bed;Other (comment) (lift; may change with progression; unclear what he already owns)    Recommendations for Other Services       Precautions / Restrictions Precautions Precautions: Fall Precaution Comments: contact precautions; CPAP Restrictions Weight Bearing Restrictions: No      Mobility  Bed Mobility Overal bed mobility: Needs Assistance Bed Mobility: Supine to Sit;Sit to Supine     Supine to sit: HOB elevated;Total assist;+2 for physical assistance;+2 for  safety/equipment Sit to supine: Total assist;+2 for physical assistance;+2 for safety/equipment   General bed mobility comments: Max multi-modal simple cues provided to have pt assist with leg movement towards EOB or to pull trunk to ascend, min success thus TAx2 for bed mobility.    Transfers                 General transfer comment: Deferred as pt lethargic and extreme pain in R leg  Ambulation/Gait             General Gait Details: Deferred as pt lethargic and extreme pain in R leg  Stairs            Wheelchair Mobility    Modified Rankin (Stroke Patients Only)       Balance Overall balance assessment: Needs assistance Sitting-balance support: Bilateral upper extremity supported;Feet supported Sitting balance-Leahy Scale: Zero Sitting balance - Comments: Pt with strong posterior and L lateral lean, reporting he felt like he was falling to the R. Pt needing max-TA initially but progressed to modA by the end for brief periods before needing maxA again. Bil UEs on rails with cues to correct posture, min success. Postural control: Posterior lean;Left lateral lean     Standing balance comment: Deferred as pt lethargic and extreme pain in R leg                             Pertinent Vitals/Pain Pain Assessment: Faces Faces Pain Scale: Hurts whole lot Pain Location: R leg Pain Descriptors / Indicators: Grimacing;Guarding;Moaning Pain Intervention(s): Limited activity within patient's tolerance;Monitored during session;Repositioned    Home Living Family/patient expects to be discharged  to:: Private residence Living Arrangements: Spouse/significant other Available Help at Discharge: Family Type of Home: Apartment Home Access: Stairs to enter   Technical brewer of Steps: flight Home Layout: One level Home Equipment: Environmental consultant - 4 wheels;Electric scooter;Cane - single point;Bedside commode (hoverround, stair lift not yet installed; some info  taken from prior entry 10/29/20) Additional Comments: unsure how much info is accurate as pt lethargic and confused and likely poor historian    Prior Function Level of Independence: Independent with assistive device(s)               Hand Dominance        Extremity/Trunk Assessment   Upper Extremity Assessment Upper Extremity Assessment: Defer to OT evaluation    Lower Extremity Assessment Lower Extremity Assessment: RLE deficits/detail;Generalized weakness;LLE deficits/detail RLE Deficits / Details: Generalized weakness with ROM deficits, limited assessment due to pain and lethargy RLE Coordination: decreased gross motor LLE Deficits / Details: Generalized weakness with ROM deficits, limited assessment due to lethargy LLE Coordination: decreased gross motor    Cervical / Trunk Assessment Cervical / Trunk Assessment: Kyphotic  Communication   Communication: No difficulties  Cognition Arousal/Alertness: Lethargic Behavior During Therapy: WFL for tasks assessed/performed Overall Cognitive Status: No family/caregiver present to determine baseline cognitive functioning                                 General Comments: Pt very lethargic, thus difficult to assess cognition. However, upon entry pt was stating "I owe you my life" and "you have done so much for me" despite assuring pt that we had not met before. Pt with difficulty following commands, sequencing, and maintaining attention, likely due to lethargy.      General Comments General comments (skin integrity, edema, etc.): Pt with poor tracking of eyes to R with inability to cross midline (noted possible nystagmus when attempting) but able to track to L; pt with inattention to R side also; RN and MD notified; SpO2 100%    Exercises     Assessment/Plan    PT Assessment Patient needs continued PT services  PT Problem List Decreased strength;Decreased range of motion;Decreased activity tolerance;Decreased  balance;Decreased mobility;Decreased coordination;Decreased cognition;Decreased knowledge of use of DME;Decreased safety awareness;Decreased skin integrity;Obesity;Pain       PT Treatment Interventions DME instruction;Gait training;Stair training;Functional mobility training;Therapeutic activities;Therapeutic exercise;Balance training;Neuromuscular re-education;Cognitive remediation;Patient/family education;Wheelchair mobility training    PT Goals (Current goals can be found in the Care Plan section)  Acute Rehab PT Goals Patient Stated Goal: to assist [name] PT Goal Formulation: With patient Time For Goal Achievement: 12/19/20 Potential to Achieve Goals: Good    Frequency Min 2X/week   Barriers to discharge        Co-evaluation PT/OT/SLP Co-Evaluation/Treatment: Yes Reason for Co-Treatment: For patient/therapist safety;To address functional/ADL transfers PT goals addressed during session: Mobility/safety with mobility;Balance         AM-PAC PT "6 Clicks" Mobility  Outcome Measure Help needed turning from your back to your side while in a flat bed without using bedrails?: Total Help needed moving from lying on your back to sitting on the side of a flat bed without using bedrails?: Total Help needed moving to and from a bed to a chair (including a wheelchair)?: Total Help needed standing up from a chair using your arms (e.g., wheelchair or bedside chair)?: Total Help needed to walk in hospital room?: Total Help needed climbing 3-5 steps with a railing? :  Total 6 Click Score: 6    End of Session Equipment Utilized During Treatment: Oxygen Activity Tolerance: Patient limited by lethargy Patient left: in bed;with call bell/phone within reach;with bed alarm set Nurse Communication: Mobility status;Other (comment) (R inattention and difficulty crossing midline/tracking eyes to R) PT Visit Diagnosis: Muscle weakness (generalized) (M62.81);Difficulty in walking, not elsewhere classified  (R26.2);Pain Pain - Right/Left: Right Pain - part of body: Leg    Time: 4650-3546 PT Time Calculation (min) (ACUTE ONLY): 31 min   Charges:   PT Evaluation $PT Eval Moderate Complexity: 1 Mod          Moishe Spice, PT, DPT Acute Rehabilitation Services  Pager: [phone] Office: [phone]   Orvan Falconer 12/05/2020, 5:36 PM

## 2020-12-05 NOTE — Progress Notes (Addendum)
PROGRESS NOTE        PATIENT DETAILS Name: Kyle Walton Age: 65 y.o. Sex: male Date of Birth: October 30, 1955 Admit Date: 12/03/2020 Admitting Physician Neena Rhymes, MD [id number], No Pcp Per (Inactive)  Brief Narrative: Patient is a 65 y.o. male PAF, CAD s/p CABG, AICD implantation, PAD-s/p numerous toe amputations, HTN, ESRD on HD MWF-admitted for right leg soft tissue infection.  See below for further details.  Significant events: 5/27>> admit for RLE pain/right leg cellulitis.  Significant studies: 5/27>> x-ray right foot: No evidence of fracture/focal bone lesions. 5/27>> chest x-ray: Mild pulmonary vascular congestion. 5/29>> RLE Doppler: No DVT 5/29>> CT right lower extremity: Soft tissue edema/skin thickening along the lateral aspect of the upper right thigh.  No drainable fluid collection.  Antimicrobial therapy: Vancomycin: 5/27>> Cefepime: 5/27>>5/29  Microbiology data: 5/27>> Influenza/COVID PCR: Negative 5/27>> blood culture: No growth 5/28>> blood culture:No growth  Procedures : None  Consults: Nephrology  DVT Prophylaxis : apixaban (ELIQUIS) tablet 2.5 mg Start: 12/03/20 2200 apixaban (ELIQUIS) tablet 2.5 mg    Subjective: Continues to complain of pain in the right lower extremity-he complains of more pain in his right lateral thigh today compared to yesterday.  He underwent a CT scan of his right thigh earlier-upon coming back to the floor-he was very lethargic-and responded following a dose of Narcan.  Assessment/Plan: Sepsis due to RLE cellulitis: Erythema in the right leg/wound area better-however continues to have significant pain and leukocytosis.  Interestingly-his pain in his right leg has improved but pain in his right thigh area continues.  CT of his right thigh done earlier today does not show any abscess-shows mostly subcutaneous swelling/edema.  Spoke with radiologist-Dr. Elbert Ewings Patel-does not think there is any  deep abscess based on his CT review.  Given that patient continues to have leukocytosis-I will continue with empiric broad-spectrum antibiotics for another day or so before considering to narrow antibiotic spectrum.  Will reassess on 5/30 and decide if patient needs additional imaging.  Acute toxic encephalopathy: Patient became very lethargic this afternoon-once he came back from radiology after CT right leg.  Placed on BiPAP-and given 1 dose of Narcan with significant improvement.  Have decreased Dilaudid dosage to 0.5 mg-have asked nursing staff to see if we can preferably give him oral narcotics to control his pain.  ESRD on HD MWF: Nephrology following.  HTN: BP stable-continue metoprolol.  Amlodipine/Imdur held  Chronic diastolic heart failure: Volume status relatively stable-diuresis with HD.  CAD-s/p CABG 2017: No anginal symptoms-on Plavix/statin/beta-blocker  History of [id number] amiodarone, metoprolol and Eliquis.  History of AICD implantation  History of PAD-s/p multiple toe amputations bilaterally  DM-2: CBG stable-continue Lantus 40 units daily, 3 units of NovoLog with meals and SSI.  Follow and adjust  Recent Labs    12/04/20 2043 12/05/20 0736 12/05/20 1147  GLUCAP 159* 121* 182*   Hypothyroidism: Continue Synthroid  OSA: Continue CPAP nightly  Legally blind  Morbid Obesity: Estimated body mass index is 37.31 kg/m as calculated from the following:   Height as of this encounter: '5\' 10"'$  (1.778 m).   Weight as of this encounter: 117.9 kg.    Diet: Diet Order            Diet heart healthy/carb modified Room service appropriate? Yes; Fluid consistency: Thin  Diet effective now  Code Status: Full code   Family Communication: Spouse at bedside on 5/28  Disposition Plan: Status is: Inpatient  Remains inpatient appropriate because:Inpatient level of care appropriate due to severity of illness   Dispo: The patient is from: Home               Anticipated d/c is to: Home              Patient currently is not medically stable to d/c.   Difficult to place patient No     Barriers to Discharge: RLE pain/cellulitis-on IV antibiotics.  Antimicrobial agents: Anti-infectives (From admission, onward)   Start     Dose/Rate Route Frequency Ordered Stop   12/04/20 1030  vancomycin (VANCOREADY) IVPB 1000 mg/200 mL        1,000 mg 200 mL/hr over 60 Minutes Intravenous  Once 12/04/20 0930 12/04/20 1230   12/03/20 2300  vancomycin (VANCOREADY) IVPB 1000 mg/200 mL        1,000 mg 200 mL/hr over 60 Minutes Intravenous Every M-W-F (Hemodialysis) 12/03/20 2019     12/03/20 1830  ceFEPIme (MAXIPIME) 1 g in sodium chloride 0.9 % 100 mL IVPB        1 g 200 mL/hr over 30 Minutes Intravenous Every 24 hours 12/03/20 1802     12/03/20 1800  vancomycin (VANCOREADY) IVPB 1000 mg/200 mL  Status:  Discontinued        1,000 mg 200 mL/hr over 60 Minutes Intravenous  Once 12/03/20 1756 12/03/20 1801   12/03/20 1300  vancomycin (VANCOREADY) IVPB 1750 mg/350 mL        1,750 mg 175 mL/hr over 120 Minutes Intravenous  Once 12/03/20 1252 12/03/20 1706   12/03/20 1300  cefTRIAXone (ROCEPHIN) 2 g in sodium chloride 0.9 % 100 mL IVPB        2 g 200 mL/hr over 30 Minutes Intravenous  Once 12/03/20 1252 12/03/20 1345       Time spent: 35 minutes-Greater than 50% of this time was spent in counseling, explanation of diagnosis, planning of further management, and coordination of care.  MEDICATIONS: Scheduled Meds: . amiodarone  200 mg Oral Daily  . apixaban  2.5 mg Oral BID  . atorvastatin  40 mg Oral Daily  . calcium acetate  1,334 mg Oral TID WC  . Chlorhexidine Gluconate Cloth  6 each Topical Q0600  . cholestyramine light  4 g Oral BID  . clopidogrel  75 mg Oral Daily  . [START ON 12/06/2020] doxercalciferol  2 mcg Intravenous Q M,W,F-HD  . gabapentin  100 mg Oral BH-qamhs  . insulin aspart  0-15 Units Subcutaneous TID WC  . insulin aspart   3 Units Subcutaneous TID WC  . insulin glargine  40 Units Subcutaneous QHS  . levothyroxine  12.5 mcg Oral QAC breakfast  . metoprolol tartrate  25 mg Oral BID  . pantoprazole  40 mg Oral Daily  . sodium bicarbonate  325 mg Oral BID   Continuous Infusions: . ceFEPime (MAXIPIME) IV 1 g (12/04/20 1844)  . vancomycin     PRN Meds:.HYDROmorphone (DILAUDID) injection, hydrOXYzine, naLOXone (NARCAN)  injection, nitroGLYCERIN, ondansetron (ZOFRAN) IV, oxyCODONE   PHYSICAL EXAM: Vital signs: Vitals:   12/05/20 0900 12/05/20 1015 12/05/20 1200 12/05/20 1303  BP: 105/60 102/78 (!) 86/50 105/78  Pulse: (!) 115 (!) 120  (!) 125  Resp:  20  18  Temp:  98.3 F (36.8 C) 98 F (36.7 C) 98 F (36.7 C)  TempSrc:  Oral  Oral  SpO2:  92% (!) 70%   Weight:      Height:       Filed Weights   12/03/20 1230  Weight: 117.9 kg   Body mass index is 37.31 kg/m.   Gen Exam:Alert awake-not in any distress HEENT:atraumatic, normocephalic Chest: B/L clear to auscultation anteriorly CVS:S1S2 regular Abdomen:soft non tender, non distended Extremities: Continues to have swelling/tenderness in the right lateral thigh area-wound in the right leg area seems to be somewhat better-erythema has decreased as well. Neurology: Non focal Skin: no rash   I have personally reviewed following labs and imaging studies  LABORATORY DATA: CBC: Recent Labs  Lab 12/03/20 1311 12/04/20 0412 12/05/20 0721  WBC 18.3* 16.2* 17.3*  NEUTROABS 15.8*  --   --   HGB 10.9* 10.2* 9.8*  HCT 33.9* 32.0* 30.9*  MCV 95.0 94.1 93.9  PLT 148* 145* 133*    Basic Metabolic Panel: Recent Labs  Lab 12/03/20 1311 12/04/20 0412 12/05/20 0721  NA 135 136 136  K 4.6 4.2 4.6  CL 99 99 100  CO2 19* 23 20*  GLUCOSE 278* 142* 119*  BUN 57* 30* 43*  CREATININE 9.94* 6.10* 7.54*  CALCIUM 8.3* 8.3* 8.4*  PHOS  --   --  6.9*    GFR: Estimated Creatinine Clearance: 12.6 mL/min (A) (by C-G formula based on SCr of 7.54  mg/dL (H)).  Liver Function Tests: Recent Labs  Lab 12/03/20 1311 12/05/20 0721  AST 38  --   ALT 12  --   ALKPHOS 77  --   BILITOT 1.3*  --   PROT 6.8  --   ALBUMIN 2.9* 2.4*   No results for input(s): LIPASE, AMYLASE in the last 168 hours. No results for input(s): AMMONIA in the last 168 hours.  Coagulation Profile: Recent Labs  Lab 12/03/20 1311 12/04/20 0412  INR 1.6* 1.5*    Cardiac Enzymes: No results for input(s): CKTOTAL, CKMB, CKMBINDEX, TROPONINI in the last 168 hours.  BNP (last 3 results) No results for input(s): PROBNP in the last 8760 hours.  Lipid Profile: No results for input(s): CHOL, HDL, LDLCALC, TRIG, CHOLHDL, LDLDIRECT in the last 72 hours.  Thyroid Function Tests: No results for input(s): TSH, T4TOTAL, FREET4, T3FREE, THYROIDAB in the last 72 hours.  Anemia Panel: No results for input(s): VITAMINB12, FOLATE, FERRITIN, TIBC, IRON, RETICCTPCT in the last 72 hours.  Urine analysis: No results found for: COLORURINE, APPEARANCEUR, LABSPEC, PHURINE, GLUCOSEU, HGBUR, BILIRUBINUR, KETONESUR, PROTEINUR, UROBILINOGEN, NITRITE, LEUKOCYTESUR  Sepsis Labs: Lactic Acid, Venous    Component Value Date/Time   LATICACIDVEN 1.9 12/03/2020 1444    MICROBIOLOGY: Recent Results (from the past 240 hour(s))  Culture, blood (Routine x 2)     Status: None (Preliminary result)   Collection Time: 12/03/20 12:32 PM   Specimen: BLOOD  Result Value Ref Range Status   Specimen Description BLOOD RIGHT ANTECUBITAL  Final   Special Requests   Final    BOTTLES DRAWN AEROBIC AND ANAEROBIC Blood Culture adequate volume   Culture   Final    NO GROWTH 2 DAYS Performed at [facility], [address] [place], [place] [address]    Report Status PENDING  Incomplete  Resp Panel by RT-PCR (Flu A&B, Covid) Nasopharyngeal Swab     Status: None   Collection Time: 12/03/20 12:52 PM   Specimen: Nasopharyngeal Swab; Nasopharyngeal(NP) swabs in vial transport medium   Result Value Ref Range Status   SARS Coronavirus 2 by RT PCR NEGATIVE NEGATIVE  Final    Comment: (NOTE) SARS-CoV-2 target nucleic acids are NOT DETECTED.  The SARS-CoV-2 RNA is generally detectable in upper respiratory specimens during the acute phase of infection. The lowest concentration of SARS-CoV-2 viral copies this assay can detect is 138 copies/mL. A negative result does not preclude SARS-Cov-2 infection and should not be used as the sole basis for treatment or other patient management decisions. A negative result may occur with  improper specimen collection/handling, submission of specimen other than nasopharyngeal swab, presence of viral mutation(s) within the areas targeted by this assay, and inadequate number of viral copies(<138 copies/mL). A negative result must be combined with clinical observations, patient history, and epidemiological information. The expected result is Negative.  Fact Sheet for Patients:  EntrepreneurPulse.com.au  Fact Sheet for Healthcare Providers:  IncredibleEmployment.be  This test is no t yet approved or cleared by the [place] FDA and  has been authorized for detection and/or diagnosis of SARS-CoV-2 by FDA under an Emergency Use Authorization (EUA). This EUA will remain  in effect (meaning this test can be used) for the duration of the COVID-19 declaration under Section 564(b)(1) of the Act, 21 U.S.C.section 360bbb-3(b)(1), unless the authorization is terminated  or revoked sooner.       Influenza A by PCR NEGATIVE NEGATIVE Final   Influenza B by PCR NEGATIVE NEGATIVE Final    Comment: (NOTE) The Xpert Xpress SARS-CoV-2/FLU/RSV plus assay is intended as an aid in the diagnosis of influenza from Nasopharyngeal swab specimens and should not be used as a sole basis for treatment. Nasal washings and aspirates are unacceptable for Xpert Xpress SARS-CoV-2/FLU/RSV testing.  Fact Sheet for  Patients: EntrepreneurPulse.com.au  Fact Sheet for Healthcare Providers: IncredibleEmployment.be  This test is not yet approved or cleared by the [place] FDA and has been authorized for detection and/or diagnosis of SARS-CoV-2 by FDA under an Emergency Use Authorization (EUA). This EUA will remain in effect (meaning this test can be used) for the duration of the COVID-19 declaration under Section 564(b)(1) of the Act, 21 U.S.C. section 360bbb-3(b)(1), unless the authorization is terminated or revoked.  Performed at [facility], [place] [address] [place], [place] [address]   Culture, blood (Routine x 2)     Status: None (Preliminary result)   Collection Time: 12/04/20  4:12 AM   Specimen: BLOOD RIGHT HAND  Result Value Ref Range Status   Specimen Description BLOOD RIGHT HAND  Final   Special Requests   Final    BOTTLES DRAWN AEROBIC AND ANAEROBIC Blood Culture results may not be optimal due to an inadequate volume of blood received in culture bottles   Culture   Final    NO GROWTH 1 DAY Performed at [facility], [place] [address] [place], [place] [address]    Report Status PENDING  Incomplete    RADIOLOGY STUDIES/RESULTS: CT FEMUR RIGHT WO CONTRAST  Result Date: 12/05/2020 CLINICAL DATA:  Limping, right leg pain EXAM: CT OF THE LOWER RIGHT EXTREMITY WITHOUT CONTRAST TECHNIQUE: Multidetector CT imaging of the right lower extremity was performed according to the standard protocol. COMPARISON:  None. FINDINGS: Bones/Joint/Cartilage No fracture or dislocation. Normal alignment. No joint effusion. Left hip joint space is maintained. Knee joint spaces are maintained. Degenerative changes of the pubic symphysis. 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 Ligaments Ligaments are suboptimally evaluated by CT. Muscles and Tendons Muscles are normal.  No intramuscular fluid collection or hematoma. Soft tissue No fluid collection or hematoma. No  soft tissue  mass. Soft tissue edema and skin thickening along the lateral aspect of the upper right thigh and circumferential subcutaneous edema throughout the remainder of the thigh extending into the lower leg. Extensive peripheral vascular atherosclerotic disease. IMPRESSION: 1.  No acute osseous injury of the right femur. 2. Soft tissue edema and skin thickening along the lateral aspect of the upper right thigh and circumferential subcutaneous edema throughout the remainder of the thigh extending into the lower leg. This likely reflects reactive edema secondary to fluid overload and less likely cellulitis. 3. No drainable fluid collections. Electronically Signed   By: Kathreen Devoid   On: 12/05/2020 10:49   VAS [name] LOWER EXTREMITY VENOUS (DVT)  Result Date: 12/05/2020  Lower Venous DVT Study Patient Name:  POE RINIKER  Date of Exam:   12/05/2020 Medical Rec #: [id number]      Accession #:    [id number] Date of Birth: Dec 17, 1955      Patient Gender: M Patient Age:   65Y Exam Location:  [facility] Procedure:      VAS [name] LOWER EXTREMITY VENOUS (DVT) Referring Phys: [place] --------------------------------------------------------------------------------  Indications: Swelling, and Erythema.  Limitations: Edema. Comparison Study: No prior study on file Performing Technologist: Sharion Dove RVS  Examination Guidelines: A complete evaluation includes B-mode imaging, spectral Doppler, color Doppler, and power Doppler as needed of all accessible portions of each vessel. Bilateral testing is considered an integral part of a complete examination. Limited examinations for reoccurring indications may be performed as noted. The reflux portion of the exam is performed with the patient in reverse Trendelenburg.  +---------+---------------+---------+-----------+----------+-------------------+ RIGHT    CompressibilityPhasicitySpontaneityPropertiesThrombus Aging       +---------+---------------+---------+-----------+----------+-------------------+ CFV      Full                                         pulsatile           +---------+---------------+---------+-----------+----------+-------------------+ SFJ      Full                                                             +---------+---------------+---------+-----------+----------+-------------------+ FV Prox  Full                                                             +---------+---------------+---------+-----------+----------+-------------------+ FV Mid   Full                                                             +---------+---------------+---------+-----------+----------+-------------------+ FV DistalFull                                                             +---------+---------------+---------+-----------+----------+-------------------+  PFV      Full                                                             +---------+---------------+---------+-----------+----------+-------------------+ POP      Full                                         pulsatile           +---------+---------------+---------+-----------+----------+-------------------+ PTV      Full                                                             +---------+---------------+---------+-----------+----------+-------------------+ PERO                                                  Not well visualized +---------+---------------+---------+-----------+----------+-------------------+   +----+---------------+---------+-----------+----------+--------------+ LEFTCompressibilityPhasicitySpontaneityPropertiesThrombus Aging +----+---------------+---------+-----------+----------+--------------+ CFV Full           Yes      No                                  +----+---------------+---------+-----------+----------+--------------+    Summary: RIGHT: - There is no evidence of deep  vein thrombosis in the lower extremity. However, portions of this examination were limited- see technologist comments above.  LEFT: - No evidence of common femoral vein obstruction.  *See table(s) above for measurements and observations.    Preliminary      LOS: 2 days   Oren Binet, MD  [facility]    To contact the attending provider between 7A-7P or the covering provider during after hours 7P-7A, please log into the web site [email or web address] and access using universal [place] password for that web site. If you do not have the password, please call the hospital operator.  12/05/2020, 2:23 PM

## 2020-12-05 NOTE — Progress Notes (Signed)
Rt placed pt on CPAP dream station in auto titrate mode max 15 min 7 w/4 Lpm bled into the system. Pts respiratory status stable w/no distress noted at this time. RT will continue to monitor.

## 2020-12-05 NOTE — Progress Notes (Signed)
VASCULAR LAB    Right lower extremity venous duplex has been performed.  See CV proc for preliminary results.   Glorie Dowlen, RVT 12/05/2020, 10:57 AM

## 2020-12-05 NOTE — Progress Notes (Signed)
[facility] Progress Note   Home meds: - amiodarone 200 qd/ norvasc 2.5 qd/ lipitor 40/ plavix 75/ imdur 30 qd/ eliquis 2.5 bid/ metoprolol 25 bid/ sl ntg prn - sod bicarb 325 bid/ phoslo 2 ac tid/ protonix 40/ synthroid 12.5 ug qd - insulin lispro SSI 2- 12ug ac tid/ glargine 40u hs - neurontin 100 bid - prn's/ vitamins/ supplements  OP [id number] MWF 4h 450/800 117kg 2K/3Ca bath TDC Hep 4900 + 500u/hr - zemplar 3.5 ug tiw - aranesp 85 mg q wed  Assessment/ Plan:   1. Cellulitis - on abx per primary team -> broad spectrum at this point. Bld cxs from 5/27 and 5/28 both NTD. He is in so much pain in the right leg.  2. ESRD - [facility] HD [facility], MWF. Started HD in 2017.  - Tolerated  HD 5/27 with net UF 2 L (ended early 5/28).  - Next HD on Monday  on MWF regimen; no absolute indications for RRT today.  2. HTN/ vol - BP's are soft, have dc'd low dose norvasc.No far off  EDW but he is still overloaded.  3. MBD ckd - cont phoslo, vdra per pharm and still need to check phos level; will check w/ HD in the AMl. 4. Anemia ckd - last Hb10.9.Cont darbe 75 weekly.   5. Atrial fib - per pmd, on home eliquis 2.5 bid and home amio/ metoprolol 6. CAD hx CABG/ PCI - on home imdur, plavix, metoprolol     Subjective:   Wife not bedside this am; pt pleasant and much less  Confused c/w yesterday. Both pt and spouse are legally blind. Main complaint is right leg pain.   Objective:   BP 98/62 (BP Location: Right Arm)   Pulse (!) 116   Temp 98.2 F (36.8 C) (Oral)   Resp 18   Ht '5\' 10"'$  (1.778 m)   Wt 117.9 kg   SpO2 95%   BMI 37.31 kg/m   Intake/Output Summary (Last 24 hours) at 12/05/2020 0805 Last data filed at 12/04/2020 1855 Gross per 24 hour  Intake 995.9 ml  Output 0 ml  Net 995.9 ml   Weight change:   Physical Exam: Gen: alert, nad, pleasant  Neck:no jvd Chest cta bilat CV: RRR no RG Abd:  soft ntnd obese Ext:   1+ tibial edema, excoriations in the right lower leg and right upper leg/ thigh is very tender and mildly erythematous Access; LIJ TDC in placec/d/i   Imaging: DG Chest 1 View  Result Date: 12/03/2020 CLINICAL DATA:  Leg pain Suspected sepsis EXAM: CHEST  1 VIEW COMPARISON:  12/01/2020 FINDINGS: Evaluation of the chest limited due to expiratory phase of imaging. Unchanged cardiomegaly. Mild pulmonary vascular congestion is unchanged. Left IJ dialysis catheter terminates in the right atrium, unchanged from prior exam. Right chest wall AICD unchanged in configuration. Sternal fixation wires and hardware again seen. Lungs are clear. IMPRESSION: Unchanged cardiomegaly and pulmonary vascular congestion. Electronically Signed   By: Miachel Roux M.D.   On: 12/03/2020 14:45   DG Tibia/Fibula Right  Result Date: 12/03/2020 CLINICAL DATA:  Right leg pain. EXAM: RIGHT TIBIA AND FIBULA - 2 VIEW COMPARISON:  None. FINDINGS: There is no evidence of fracture or other focal bone lesions. Vascular calcifications are noted. IMPRESSION: No acute abnormality is noted. Electronically Signed   By: Marijo Conception M.D.   On: 12/03/2020 14:47   DG Hip Unilat W or Wo Pelvis 2-3 Views Right  Result Date: 12/03/2020 CLINICAL DATA:  Right leg pain. EXAM: DG HIP (WITH OR WITHOUT PELVIS) 2-3V RIGHT COMPARISON:  None. FINDINGS: There is no evidence of hip fracture or dislocation. There is no evidence of arthropathy or other focal bone abnormality. IMPRESSION: Negative. Electronically Signed   By: Marijo Conception M.D.   On: 12/03/2020 14:46    Labs: BMET Recent Labs  Lab 12/03/20 1311 12/04/20 0412  NA 135 136  K 4.6 4.2  CL 99 99  CO2 19* 23  GLUCOSE 278* 142*  BUN 57* 30*  CREATININE 9.94* 6.10*  CALCIUM 8.3* 8.3*   CBC Recent Labs  Lab 12/03/20 1311 12/04/20 0412  WBC 18.3* 16.2*  NEUTROABS 15.8*  --   HGB 10.9* 10.2*  HCT 33.9* 32.0*  MCV 95.0 94.1  PLT 148* 145*    Medications:    . amiodarone   200 mg Oral Daily  . apixaban  2.5 mg Oral BID  . atorvastatin  40 mg Oral Daily  . calcium acetate  1,334 mg Oral TID WC  . Chlorhexidine Gluconate Cloth  6 each Topical Q0600  . cholestyramine light  4 g Oral BID  . clopidogrel  75 mg Oral Daily  . [START ON 12/06/2020] doxercalciferol  2 mcg Intravenous Q M,W,F-HD  . gabapentin  100 mg Oral BH-qamhs  . insulin aspart  0-15 Units Subcutaneous TID WC  . insulin aspart  3 Units Subcutaneous TID WC  . insulin glargine  40 Units Subcutaneous QHS  . levothyroxine  12.5 mcg Oral QAC breakfast  . metoprolol tartrate  25 mg Oral BID  . pantoprazole  40 mg Oral Daily  . sodium bicarbonate  325 mg Oral BID      Otelia Santee, MD 12/05/2020, 8:05 AM

## 2020-12-05 NOTE — Progress Notes (Signed)
Patient stated that, "he didn't want any of his meds". He wanted the strong stuff. Explained to patient that it would not be safe to give him dilaudid b/c of his low b/p and the fact that they had to narcan him earlier. Patient insisted on moving to the unit so that he can have the "strong stuff and sleep".   Notified Dr. Hal Hope. Dr Hal Hope called pt on room phone. Patient agreed to take night time meds.   Will continue to monitor b/ps and patient status.

## 2020-12-05 NOTE — Progress Notes (Signed)
Notified Dr. Hal Hope concerning patient's pain issues. Patient requesting dilaudid. Patient was narcan earlier around 1315 due to lethargy and unable to be aroused.   Patient transferred from 21M with soft b/p. Patient continues to have soft b/p. Dilaudid d/c'd. Low dose Ultram ordered.    Will continue to monitor

## 2020-12-06 DIAGNOSIS — G4733 Obstructive sleep apnea (adult) (pediatric): Secondary | ICD-10-CM | POA: Diagnosis not present

## 2020-12-06 DIAGNOSIS — L03115 Cellulitis of right lower limb: Secondary | ICD-10-CM | POA: Diagnosis not present

## 2020-12-06 DIAGNOSIS — N186 End stage renal disease: Secondary | ICD-10-CM | POA: Diagnosis not present

## 2020-12-06 DIAGNOSIS — G5711 Meralgia paresthetica, right lower limb: Secondary | ICD-10-CM

## 2020-12-06 DIAGNOSIS — I5032 Chronic diastolic (congestive) heart failure: Secondary | ICD-10-CM | POA: Diagnosis not present

## 2020-12-06 DIAGNOSIS — E1151 Type 2 diabetes mellitus with diabetic peripheral angiopathy without gangrene: Secondary | ICD-10-CM | POA: Diagnosis not present

## 2020-12-06 LAB — COMPREHENSIVE METABOLIC PANEL
ALT: 15 U/L (ref 0–44)
AST: 44 U/L — ABNORMAL HIGH (ref 15–41)
Albumin: 2.3 g/dL — ABNORMAL LOW (ref 3.5–5.0)
Alkaline Phosphatase: 66 U/L (ref 38–126)
Anion gap: 14 (ref 5–15)
BUN: 54 mg/dL — ABNORMAL HIGH (ref 8–23)
CO2: 22 mmol/L (ref 22–32)
Calcium: 8.3 mg/dL — ABNORMAL LOW (ref 8.9–10.3)
Chloride: 98 mmol/L (ref 98–111)
Creatinine, Ser: 8.64 mg/dL — ABNORMAL HIGH (ref 0.61–1.24)
GFR, Estimated: 6 mL/min — ABNORMAL LOW (ref >60)
Glucose, Bld: 196 mg/dL — ABNORMAL HIGH (ref 70–99)
Potassium: 4.7 mmol/L (ref 3.5–5.1)
Sodium: 134 mmol/L — ABNORMAL LOW (ref 135–145)
Total Bilirubin: 1 mg/dL (ref 0.3–1.2)
Total Protein: 5.7 g/dL — ABNORMAL LOW (ref 6.5–8.1)

## 2020-12-06 LAB — CBC
HCT: 29.9 % — ABNORMAL LOW (ref 39.0–52.0)
Hemoglobin: 9.4 g/dL — ABNORMAL LOW (ref 13.0–17.0)
MCH: 29.6 pg (ref 26.0–34.0)
MCHC: 31.4 g/dL (ref 30.0–36.0)
MCV: 94 fL (ref 80.0–100.0)
Platelets: 149 10*3/uL — ABNORMAL LOW (ref 150–400)
RBC: 3.18 MIL/uL — ABNORMAL LOW (ref 4.22–5.81)
RDW: 16.5 % — ABNORMAL HIGH (ref 11.5–15.5)
WBC: 15.3 10*3/uL — ABNORMAL HIGH (ref 4.0–10.5)
nRBC: 0.2 % (ref 0.0–0.2)

## 2020-12-06 LAB — GLUCOSE, CAPILLARY
Glucose-Capillary: 170 mg/dL — ABNORMAL HIGH (ref 70–99)
Glucose-Capillary: 162 mg/dL — ABNORMAL HIGH (ref 70–99)
Glucose-Capillary: 127 mg/dL — ABNORMAL HIGH (ref 70–99)

## 2020-12-06 LAB — AMMONIA: Ammonia: 56 umol/L — ABNORMAL HIGH (ref 9–35)

## 2020-12-06 LAB — PHOSPHORUS: Phosphorus: 7.9 mg/dL — ABNORMAL HIGH (ref 2.5–4.6)

## 2020-12-06 MED ORDER — HYDROXYZINE HCL 25 MG PO TABS
ORAL_TABLET | ORAL | Status: AC
  Filled 2020-12-06: qty 1

## 2020-12-06 MED ORDER — LIDOCAINE 5 % EX PTCH
1.0000 | MEDICATED_PATCH | CUTANEOUS | Status: DC
  Administered 2020-12-06: 1 via TRANSDERMAL
  Filled 2020-12-06: qty 1

## 2020-12-06 MED ORDER — MIDODRINE HCL 5 MG PO TABS
ORAL_TABLET | ORAL | Status: AC
  Administered 2020-12-06: 10 mg via ORAL
  Filled 2020-12-06: qty 2

## 2020-12-06 MED ORDER — VANCOMYCIN HCL IN DEXTROSE 1-5 GM/200ML-% IV SOLN
INTRAVENOUS | Status: AC
  Administered 2020-12-06: 1000 mg
  Filled 2020-12-06: qty 200

## 2020-12-06 MED ORDER — CHOLESTYRAMINE LIGHT 4 G PO PACK
4.0000 g | PACK | Freq: Four times a day (QID) | ORAL | Status: DC
  Administered 2020-12-06 – 2020-12-12 (×22): 4 g via ORAL
  Filled 2020-12-06 (×29): qty 1

## 2020-12-06 MED ORDER — DOXERCALCIFEROL 4 MCG/2ML IV SOLN
INTRAVENOUS | Status: AC
  Administered 2020-12-06: 2 ug via INTRAVENOUS
  Filled 2020-12-06: qty 2

## 2020-12-06 MED ORDER — PREGABALIN 25 MG PO CAPS
25.0000 mg | ORAL_CAPSULE | Freq: Every day | ORAL | Status: DC
  Administered 2020-12-06 – 2020-12-31 (×26): 25 mg via ORAL
  Filled 2020-12-06 (×26): qty 1

## 2020-12-06 MED ORDER — LIDOCAINE 4 % EX CREA
TOPICAL_CREAM | Freq: Three times a day (TID) | CUTANEOUS | Status: DC
  Administered 2020-12-07 – 2020-12-09 (×4): 1 via TOPICAL
  Filled 2020-12-06 (×8): qty 5

## 2020-12-06 MED ORDER — HEPARIN SODIUM (PORCINE) 1000 UNIT/ML IJ SOLN
INTRAMUSCULAR | Status: AC
  Filled 2020-12-06: qty 1

## 2020-12-06 MED ORDER — MIDODRINE HCL 5 MG PO TABS
10.0000 mg | ORAL_TABLET | Freq: Three times a day (TID) | ORAL | Status: DC
  Administered 2020-12-06 – 2020-12-31 (×71): 10 mg via ORAL
  Filled 2020-12-06 (×69): qty 2

## 2020-12-06 MED ORDER — ALBUMIN HUMAN 25 % IV SOLN
INTRAVENOUS | Status: AC
  Administered 2020-12-06: 25 g
  Filled 2020-12-06: qty 100

## 2020-12-06 NOTE — TOC Initial Note (Signed)
Transition of Care [facility]) - Initial/Assessment Note    Patient Details  Name: Kyle Walton MRN: [id number] Date of Birth: Mar 31, 1956  Transition of [facility]) CM/SW Contact:    [address]oreta [address], [place] Phone Number: 12/06/2020, 12:57 PM  Clinical Narrative:                 CSW spoke with pt's wife via phone in reference to dc plans for pt. Pt's wife states that pt has been to two different rehab facilities in the past and pt's wife wasn't impressed with the quality of care pt received. Pt's wife stated that the last rehab facility pt was at, pt contracted MRSA and was so badly infected it was "dripping from his head". Pt's wife states that she would rather have [place] come in as well as home PT/OT to work with pt. Pt has received both vaccines as well as the booster.         Patient Goals and CMS Choice        Expected Discharge Plan and Services                                                Prior Living Arrangements/Services                       Activities of Daily Living      Permission Sought/Granted                  Emotional Assessment              Admission diagnosis:  Cellulitis of right leg [L03.115] Cellulitis of right lower extremity [L03.115] Sepsis [facility]) [A41.9] Patient Active Problem List   Diagnosis Date Noted  . Cellulitis of right leg 12/03/2020  . Sepsis ([place]) 12/03/2020  . Aggressive behavior   . CAD (coronary artery disease) 10/27/2020  . 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  . Cardiopulmonary arrest with successful resuscitation ([place]) 10/27/2020  . Diabetes mellitus with peripheral vascular disease ([place]) 10/27/2020  . History of DVT (deep vein thrombosis) 10/27/2020  . Essential hypertension 10/27/2020  . OSA (obstructive sleep apnea) 10/27/2020  . Paroxysmal atrial fibrillation ([place]) 10/27/2020  . Secondary hyperparathyroidism, renal ([place]) 10/27/2020  . 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 Chronic  diastolic congestive heart failure, NYHA class 4 ([place]) 10/27/2020  . ESRD on hemodialysis ([place]) 10/27/2020  . Impaired physical mobility 10/27/2020  . Chronic respiratory failure with hypercapnia ([place]) 10/27/2020  . 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 History of major depression 10/27/2020  . History of macular degeneration 10/27/2020  . Legally blind 10/27/2020   PCP:  Patient, No Pcp Per (Inactive) Pharmacy:   [place], [place] [address] Phone: [phone] Fax: [phone]     Social Determinants of Health (SDOH) Interventions    Readmission Risk Interventions No flowsheet data found.

## 2020-12-06 NOTE — Progress Notes (Addendum)
[facility] Progress Note   Subjective:   Pt seen on HD. Hypotensive and tachycardic at the start of dialysis, given midodrine and albumin. Reports his blood pressure is always low at home as well, currently without dizziness, CP or palpitations. C/o nausea and is hungry this AM. Also reports body-wide itching, reports he usually takes benadryl because atarax is "not strong enough"  Objective Vitals:   12/05/20 2143 12/05/20 2315 12/05/20 2322 12/06/20 0518  BP: (!) 88/56 (!) 97/50  (!) 83/45  Pulse: (!) 117 (!) 117    Resp: 14 (!) '23 16 18  '$ Temp:  97.8 F (36.6 C)  97.6 F (36.4 C)  TempSrc:  Oral  Oral  SpO2: 95% 95%    Weight:      Height:       Physical Exam General: Well developed male, alert and in NAD Heart: Tachycardic, regular rhythm, no murmur auscultated Lungs: CTA bilaterally without wheezing, rhonchi or rales Abdomen: Soft, non-tender, non-distended, +BS Extremities: 1+ pretibial edema, R upper leg mildly erythematous with scattered shallow wounds lower leg Dialysis Access:  L IJ [facility] accessed  Additional Objective Labs: Basic Metabolic Panel: Recent Labs  Lab 12/04/20 0412 12/05/20 0721 12/06/20 0042 12/06/20 0700  NA 136 136 134*  --   K 4.2 4.6 4.7  --   CL 99 100 98  --   CO2 23 20* 22  --   GLUCOSE 142* 119* 196*  --   BUN 30* 43* 54*  --   CREATININE 6.10* 7.54* 8.64*  --   CALCIUM 8.3* 8.4* 8.3*  --   PHOS  --  6.9*  --  7.9*   Liver Function Tests: Recent Labs  Lab 12/03/20 1311 12/05/20 0721 12/06/20 0042  AST 38  --  44*  ALT 12  --  15  ALKPHOS 77  --  66  BILITOT 1.3*  --  1.0  PROT 6.8  --  5.7*  ALBUMIN 2.9* 2.4* 2.3*   No results for input(s): LIPASE, AMYLASE in the last 168 hours. CBC: Recent Labs  Lab 12/03/20 1311 12/04/20 0412 12/05/20 0721 12/06/20 0042  WBC 18.3* 16.2* 17.3* 15.3*  NEUTROABS 15.8*  --   --   --   HGB 10.9* 10.2* 9.8* 9.4*  HCT 33.9* 32.0* 30.9* 29.9*  MCV 95.0 94.1 93.9 94.0  PLT  148* 145* 133* 149*   Blood Culture    Component Value Date/Time   SDES BLOOD RIGHT HAND 12/04/2020 0412   SPECREQUEST  12/04/2020 0412    BOTTLES DRAWN AEROBIC AND ANAEROBIC Blood Culture results may not be optimal due to an inadequate volume of blood received in culture bottles   CULT  12/04/2020 0412    NO GROWTH 1 DAY Performed at [facility] [address] [place], [place] [address]    REPTSTATUS PENDING 12/04/2020 [id number]    Cardiac Enzymes: No results for input(s): CKTOTAL, CKMB, CKMBINDEX, TROPONINI in the last 168 hours. CBG: Recent Labs  Lab 12/05/20 0736 12/05/20 1147 12/05/20 1641 12/05/20 2135 12/06/20 0543  GLUCAP 121* 182* 165* 193* 170*   Iron Studies: No results for input(s): IRON, TIBC, TRANSFERRIN, FERRITIN in the last 72 hours. '@lablastinr3'$ @ Studies/Results: CT FEMUR RIGHT WO CONTRAST  Result Date: 12/05/2020 CLINICAL DATA:  Limping, right leg pain EXAM: CT OF THE LOWER RIGHT EXTREMITY WITHOUT CONTRAST TECHNIQUE: Multidetector CT imaging of the right lower extremity was performed according to the standard protocol. COMPARISON:  None. FINDINGS: Bones/Joint/Cartilage No fracture or  dislocation. Normal alignment. No joint effusion. Left hip joint space is maintained. Knee joint spaces are maintained. Degenerative changes of the pubic symphysis. 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 Ligaments Ligaments are suboptimally evaluated by CT. Muscles and Tendons Muscles are normal.  No intramuscular fluid collection or hematoma. Soft tissue No fluid collection or hematoma. No soft tissue mass. Soft tissue edema and skin thickening along the lateral aspect of the upper right thigh and circumferential subcutaneous edema throughout the remainder of the thigh extending into the lower leg. Extensive peripheral vascular atherosclerotic disease. IMPRESSION: 1.  No acute osseous injury of the right femur. 2. Soft tissue edema and skin thickening along the lateral aspect of  the upper right thigh and circumferential subcutaneous edema throughout the remainder of the thigh extending into the lower leg. This likely reflects reactive edema secondary to fluid overload and less likely cellulitis. 3. No drainable fluid collections. Electronically Signed   By: Kathreen Devoid   On: 12/05/2020 10:49   DG CHEST PORT 1 VIEW  Result Date: 12/05/2020 CLINICAL DATA:  Shortness of breath EXAM: PORTABLE CHEST 1 VIEW COMPARISON:  Dec 03, 2020 FINDINGS: The cardiomediastinal silhouette is unchanged and enlarged in contour.RIGHT chest AICD. Status post median sternotomy and CABG. LEFT IJ CVC tip terminates over the RIGHT atrium. No pleural effusion. No pneumothorax. Scattered linear opacities consistent with atelectasis. Perihilar vascular fullness without overt edema. Visualized abdomen is unremarkable. Multilevel degenerative changes of the thoracic spine. IMPRESSION: Cardiomegaly with pulmonary vascular congestion without overt edema. Electronically Signed   By: Valentino Saxon MD   On: 12/05/2020 14:42   VAS [name] LOWER EXTREMITY VENOUS (DVT)  Result Date: 12/05/2020  Lower Venous DVT Study Patient Name:  Kyle Walton  Date of Exam:   12/05/2020 Medical Rec #: [id number]      Accession #:    [id number] Date of Birth: [birth year]      Patient Gender: M Patient Age:   65Y Exam [facility] Procedure:      VAS [name] LOWER EXTREMITY VENOUS (DVT) Referring Phys: [place] --------------------------------------------------------------------------------  Indications: Swelling, and Erythema.  Limitations: Edema. Comparison Study: No prior study on file Performing Technologist: Sharion Dove RVS  Examination Guidelines: A complete evaluation includes B-mode imaging, spectral Doppler, color Doppler, and power Doppler as needed of all accessible portions of each vessel. Bilateral testing is considered an integral part of a complete examination. Limited examinations for  reoccurring indications may be performed as noted. The reflux portion of the exam is performed with the patient in reverse Trendelenburg.  +---------+---------------+---------+-----------+----------+-------------------+ RIGHT    CompressibilityPhasicitySpontaneityPropertiesThrombus Aging      +---------+---------------+---------+-----------+----------+-------------------+ CFV      Full                                         pulsatile           +---------+---------------+---------+-----------+----------+-------------------+ SFJ      Full                                                             +---------+---------------+---------+-----------+----------+-------------------+ FV Prox  Full                                                             +---------+---------------+---------+-----------+----------+-------------------+  FV Mid   Full                                                             +---------+---------------+---------+-----------+----------+-------------------+ FV DistalFull                                                             +---------+---------------+---------+-----------+----------+-------------------+ PFV      Full                                                             +---------+---------------+---------+-----------+----------+-------------------+ POP      Full                                         pulsatile           +---------+---------------+---------+-----------+----------+-------------------+ PTV      Full                                                             +---------+---------------+---------+-----------+----------+-------------------+ PERO                                                  Not well visualized +---------+---------------+---------+-----------+----------+-------------------+   +----+---------------+---------+-----------+----------+--------------+  LEFTCompressibilityPhasicitySpontaneityPropertiesThrombus Aging +----+---------------+---------+-----------+----------+--------------+ CFV Full           Yes      No                                  +----+---------------+---------+-----------+----------+--------------+     Summary: RIGHT: - There is no evidence of deep vein thrombosis in the lower extremity. However, portions of this examination were limited- see technologist comments above.  LEFT: - No evidence of common femoral vein obstruction.  *See table(s) above for measurements and observations. Electronically signed by Monica Martinez MD on 12/05/2020 at 5:17:31 PM.    Final    Medications: . vancomycin     . acetaminophen  1,000 mg Oral Q8H  . amiodarone  200 mg Oral Daily  . apixaban  2.5 mg Oral BID  . atorvastatin  40 mg Oral Daily  . calcium acetate  1,334 mg Oral TID WC  . Chlorhexidine Gluconate Cloth  6 each Topical Q0600  . cholestyramine light  4 g Oral BID  . clopidogrel  75 mg Oral Daily  . doxercalciferol  2 mcg Intravenous Q M,W,F-HD  . gabapentin  100 mg Oral BH-qamhs  . insulin aspart  0-15 Units Subcutaneous TID WC  . insulin aspart  3 Units Subcutaneous TID WC  . insulin glargine  40 Units Subcutaneous QHS  . levothyroxine  12.5 mcg Oral QAC breakfast  . metoprolol tartrate  25 mg Oral BID  . midodrine  10 mg Oral TID WC  . pantoprazole  40 mg Oral Daily  . sodium bicarbonate  325 mg Oral BID    Outpatient Dialysis Orders: [facility] MWF 4h 450/800 117kg 2K/3Ca bath TDC Hep 4900 + 500u/hr - zemplar 3.5 ug tiw - aranesp 85 mg q wed  Assessment/Plan: 1. Cellulitis - on abx per primary team (vancomycin). Bld cxs from 5/27 and 5/28 both NTD. Still reporting severe pain and was requesting stronger pain meds overnight but required narcan yesterday due to lethargy.  2. ESRD - gets HD per [facility], MWF. Started HD in 2017. - Tolerated  HD 5/27 with net UF 2 L (ended early  5/28). On HD today, next treatment will be Wednesday.  2. HTN/ vol - BP's are soft, have dc'd low dose norvasc.On metoprolol for a.fib. Getting midodrine and albumin with HD. Not far offEDW but he is still overloaded.  3. MBD ckd - cont phoslo, vdra. Still need to check phos level; will check w/ HD. High phos can also cause itching.  4. Pruritis- chronic, atarax on med list but requests benadryl, agrees to wait for BP/HR to regulate a bit on on HD before given anticholinergic meds.  5. Anemia ckd - last Hb9.4.Cont darbe 75 weekly. 6. Atrial fib - per pmd, on home eliquis 2.5 bid and home amio/ metoprolol 7. CAD hx CABG/ PCI - on home imdur, plavix, metoprolol  Anice Paganini, PA-C 12/06/2020, 8:34 AM  [facility] Pager: [phone]  Pt seen, examined and agree w assess/plan as above with additions as indicated. Pt complains of severe pain RLE from knee to R hip.  No skin lesions to suggest calciphylaxis.  Imaging hasn't shown any fractures or joint infections. Have d/w pmd. He has significant myoclonic jerking due to narcotic excess or due to gabapentin. Will dc gabapentin.  Difficult situation as pt continues to ask for strong pain medication but is at high risk for complications.  [facility] 12/06/2020, 1:53 PM

## 2020-12-06 NOTE — Progress Notes (Signed)
PROGRESS NOTE        PATIENT DETAILS Name: Kyle Walton Age: 65 y.o. Sex: male Date of Birth: 15-Jan-1956 Admit Date: 12/03/2020 Admitting Physician Neena Rhymes, MD QP:[id number], No Pcp Per (Inactive)  Brief Narrative: Patient is a 65 y.o. male PAF, CAD s/p CABG, AICD implantation, PAD-s/p numerous toe amputations, HTN, ESRD on HD MWF-admitted for right leg soft tissue infection and right upper thigh pain.  See below for further details.  Significant events: 5/27>> admit for RLE pain/right leg cellulitis. 5/29>> lethargy/encephalopathy-significant improvement post Narcan  Significant studies: 5/27>> x-ray right foot: No evidence of fracture/focal bone lesions. 5/27>> chest x-ray: Mild pulmonary vascular congestion. 5/29>> RLE Doppler: No DVT 5/29>> CT right lower extremity: Soft tissue edema/skin thickening along the lateral aspect of the upper right thigh.  No drainable fluid collection.  Antimicrobial therapy: Vancomycin: 5/27>> Cefepime: 5/27>>5/29  Microbiology data: 5/27>> Influenza/COVID PCR: Negative 5/27>> blood culture: No growth 5/28>> blood culture:No growth  Procedures : None  Consults: Nephrology  DVT Prophylaxis : apixaban (ELIQUIS) tablet 2.5 mg Start: 12/03/20 2200 apixaban (ELIQUIS) tablet 2.5 mg    Subjective: Right lateral thigh pain unchanged.  Soft tissue erythema/swelling/wounds and right lower leg improved.  He is completely awake and alert today.  Assessment/Plan: Sepsis due to RLE cellulitis: Sepsis physiology improved-erythema/right leg swelling/wounds have significantly improved.  On IV vancomycin.  Cultures negative so far.    Right lateral thigh pain: Unclear etiology-CT without any soft tissue collection.  CT showing soft tissue/skin edema which is related to volume overload and unlikely causing this much of pain.  Right lower extremity Dopplers negative for DVT.  Apart from pain-no erythema/nodules evident  on exam.  Wonder this could be neuropathic pain-?  Diabetic amyotrophy.  Add Lyrica-already on Neurontin-we will discuss with neurology.  Acute toxic encephalopathy: Occurred on 5/29-given 1 dose of Narcan.  Currently completely awake and alert.  Minimize narcotics as much as possible.    ESRD on HD MWF: Nephrology following.  HTN: BP on the softer/lower side-started midodrine.  Amlodipine/Imdur held-remains on metoprolol.  Chronic diastolic heart failure: Volume status relatively stable-diuresis with HD.  CAD-s/p CABG 2017: No anginal symptoms-on Plavix/statin/beta-blocker  History of [id number] amiodarone, metoprolol and Eliquis.  History of AICD implantation  History of PAD-s/p multiple toe amputations bilaterally  DM-2: CBG stable-continue Lantus 40 units daily, 3 units of NovoLog with meals and SSI.  Follow and adjust  Recent Labs    12/05/20 1641 12/05/20 2135 12/06/20 0543  GLUCAP 165* 193* 170*   Hypothyroidism: Continue Synthroid  OSA: Continue CPAP nightly  Legally blind  Morbid Obesity: Estimated body mass index is 39.89 kg/m as calculated from the following:   Height as of this encounter: '5\' 10"'$  (1.778 m).   Weight as of this encounter: 126.1 kg.    Diet: Diet Order            Diet heart healthy/carb modified Room service appropriate? Yes; Fluid consistency: Thin  Diet effective now                  Code Status: Full code   Family Communication: Spouse at bedside on 5/28-left a voicemail on 5/29  Disposition Plan: Status is: Inpatient  Remains inpatient appropriate because:Inpatient level of care appropriate due to severity of illness   Dispo: The patient is from: Home  Anticipated d/c is to: Home              Patient currently is not medically stable to d/c.   Difficult to place patient No     Barriers to Discharge: RLE pain/cellulitis-on IV antibiotics.  Antimicrobial agents: Anti-infectives (From admission, onward)    Start     Dose/Rate Route Frequency Ordered Stop   12/04/20 1030  vancomycin (VANCOREADY) IVPB 1000 mg/200 mL        1,000 mg 200 mL/hr over 60 Minutes Intravenous  Once 12/04/20 0930 12/04/20 1230   12/03/20 2300  vancomycin (VANCOREADY) IVPB 1000 mg/200 mL        1,000 mg 200 mL/hr over 60 Minutes Intravenous Every M-W-F (Hemodialysis) 12/03/20 2019     12/03/20 1830  ceFEPIme (MAXIPIME) 1 g in sodium chloride 0.9 % 100 mL IVPB  Status:  Discontinued        1 g 200 mL/hr over 30 Minutes Intravenous Every 24 hours 12/03/20 1802 12/05/20 1630   12/03/20 1800  vancomycin (VANCOREADY) IVPB 1000 mg/200 mL  Status:  Discontinued        1,000 mg 200 mL/hr over 60 Minutes Intravenous  Once 12/03/20 1756 12/03/20 1801   12/03/20 1300  vancomycin (VANCOREADY) IVPB 1750 mg/350 mL        1,750 mg 175 mL/hr over 120 Minutes Intravenous  Once 12/03/20 1252 12/03/20 1706   12/03/20 1300  cefTRIAXone (ROCEPHIN) 2 g in sodium chloride 0.9 % 100 mL IVPB        2 g 200 mL/hr over 30 Minutes Intravenous  Once 12/03/20 1252 12/03/20 1345       Time spent: 35 minutes-Greater than 50% of this time was spent in counseling, explanation of diagnosis, planning of further management, and coordination of care.  MEDICATIONS: Scheduled Meds: . acetaminophen  1,000 mg Oral Q8H  . amiodarone  200 mg Oral Daily  . apixaban  2.5 mg Oral BID  . atorvastatin  40 mg Oral Daily  . calcium acetate  1,334 mg Oral TID WC  . Chlorhexidine Gluconate Cloth  6 each Topical Q0600  . cholestyramine light  4 g Oral BID  . clopidogrel  75 mg Oral Daily  . doxercalciferol  2 mcg Intravenous Q M,W,F-HD  . gabapentin  100 mg Oral BH-qamhs  . hydrOXYzine      . insulin aspart  0-15 Units Subcutaneous TID WC  . insulin aspart  3 Units Subcutaneous TID WC  . insulin glargine  40 Units Subcutaneous QHS  . levothyroxine  12.5 mcg Oral QAC breakfast  . metoprolol tartrate  25 mg Oral BID  . midodrine  10 mg Oral TID WC  .  pantoprazole  40 mg Oral Daily  . pregabalin  25 mg Oral Daily  . sodium bicarbonate  325 mg Oral BID   Continuous Infusions: . vancomycin     PRN Meds:.hydrOXYzine, naLOXone (NARCAN)  injection, nitroGLYCERIN, ondansetron (ZOFRAN) IV   PHYSICAL EXAM: Vital signs: Vitals:   12/06/20 0900 12/06/20 0930 12/06/20 1000 12/06/20 1030  BP: (!) 96/40 (!) 87/50 (!) 95/52 (!) 98/53  Pulse: (!) 118 (!) 118 (!) 104 (!) 102  Resp: (!) 22  (!) 23 (!) 21  Temp:      TempSrc:      SpO2: 100%  93% 95%  Weight:      Height:       Filed Weights   12/03/20 1230 12/06/20 0809  Weight: 117.9 kg 126.1 kg  Body mass index is 39.89 kg/m.   Gen Exam:Alert awake-not in any distress HEENT:atraumatic, normocephalic Chest: B/L clear to auscultation anteriorly CVS:S1S2 regular Abdomen:soft non tender, non distended Extremities: Right thigh area-lateral aspect-still very tender.  Right leg wound/erythema significantly improved Neurology: Non focal Skin: no rash   I have personally reviewed following labs and imaging studies  LABORATORY DATA: CBC: Recent Labs  Lab 12/03/20 1311 12/04/20 0412 12/05/20 0721 12/06/20 0042  WBC 18.3* 16.2* 17.3* 15.3*  NEUTROABS 15.8*  --   --   --   HGB 10.9* 10.2* 9.8* 9.4*  HCT 33.9* 32.0* 30.9* 29.9*  MCV 95.0 94.1 93.9 94.0  PLT 148* 145* 133* 149*    Basic Metabolic Panel: Recent Labs  Lab 12/03/20 1311 12/04/20 0412 12/05/20 0721 12/06/20 0042 12/06/20 0700  NA 135 136 136 134*  --   K 4.6 4.2 4.6 4.7  --   CL 99 99 100 98  --   CO2 19* 23 20* 22  --   GLUCOSE 278* 142* 119* 196*  --   BUN 57* 30* 43* 54*  --   CREATININE 9.94* 6.10* 7.54* 8.64*  --   CALCIUM 8.3* 8.3* 8.4* 8.3*  --   PHOS  --   --  6.9*  --  7.9*    GFR: Estimated Creatinine Clearance: 11.4 mL/min (A) (by C-G formula based on SCr of 8.64 mg/dL (H)).  Liver Function Tests: Recent Labs  Lab 12/03/20 1311 12/05/20 0721 12/06/20 0042  AST 38  --  44*  ALT 12   --  15  ALKPHOS 77  --  66  BILITOT 1.3*  --  1.0  PROT 6.8  --  5.7*  ALBUMIN 2.9* 2.4* 2.3*   No results for input(s): LIPASE, AMYLASE in the last 168 hours. Recent Labs  Lab 12/06/20 0042  AMMONIA 56*    Coagulation Profile: Recent Labs  Lab 12/03/20 1311 12/04/20 0412  INR 1.6* 1.5*    Cardiac Enzymes: No results for input(s): CKTOTAL, CKMB, CKMBINDEX, TROPONINI in the last 168 hours.  BNP (last 3 results) No results for input(s): PROBNP in the last 8760 hours.  Lipid Profile: No results for input(s): CHOL, HDL, LDLCALC, TRIG, CHOLHDL, LDLDIRECT in the last 72 hours.  Thyroid Function Tests: No results for input(s): TSH, T4TOTAL, FREET4, T3FREE, THYROIDAB in the last 72 hours.  Anemia Panel: No results for input(s): VITAMINB12, FOLATE, FERRITIN, TIBC, IRON, RETICCTPCT in the last 72 hours.  Urine analysis: No results found for: COLORURINE, APPEARANCEUR, LABSPEC, PHURINE, GLUCOSEU, HGBUR, BILIRUBINUR, KETONESUR, PROTEINUR, UROBILINOGEN, NITRITE, LEUKOCYTESUR  Sepsis Labs: Lactic Acid, Venous    Component Value Date/Time   LATICACIDVEN 1.9 12/03/2020 1444    MICROBIOLOGY: Recent Results (from the past 240 hour(s))  Culture, blood (Routine x 2)     Status: None (Preliminary result)   Collection Time: 12/03/20 12:32 PM   Specimen: BLOOD  Result Value Ref Range Status   Specimen Description BLOOD RIGHT ANTECUBITAL  Final   Special Requests   Final    BOTTLES DRAWN AEROBIC AND ANAEROBIC Blood Culture adequate volume   Culture   Final    NO GROWTH 3 DAYS Performed at [facility], [address], [place], [place] [address]    Report Status PENDING  Incomplete  Resp Panel by RT-PCR (Flu A&B, Covid) Nasopharyngeal Swab     Status: None   Collection Time: 12/03/20 12:52 PM   Specimen: Nasopharyngeal Swab; Nasopharyngeal(NP) swabs in vial transport medium  Result  Value Ref Range Status   SARS Coronavirus 2 by RT PCR NEGATIVE NEGATIVE Final    Comment:  (NOTE) SARS-CoV-2 target nucleic acids are NOT DETECTED.  The SARS-CoV-2 RNA is generally detectable in upper respiratory specimens during the acute phase of infection. The lowest concentration of SARS-CoV-2 viral copies this assay can detect is 138 copies/mL. A negative result does not preclude SARS-Cov-2 infection and should not be used as the sole basis for treatment or other patient management decisions. A negative result may occur with  improper specimen collection/handling, submission of specimen other than nasopharyngeal swab, presence of viral mutation(s) within the areas targeted by this assay, and inadequate number of viral copies(<138 copies/mL). A negative result must be combined with clinical observations, patient history, and epidemiological information. The expected result is Negative.  Fact Sheet for Patients:  EntrepreneurPulse.com.au  Fact Sheet for Healthcare Providers:  IncredibleEmployment.be  This test is no t yet approved or cleared by the [place] FDA and  has been authorized for detection and/or diagnosis of SARS-CoV-2 by FDA under an Emergency Use Authorization (EUA). This EUA will remain  in effect (meaning this test can be used) for the duration of the COVID-19 declaration under Section 564(b)(1) of the Act, 21 U.S.C.section 360bbb-3(b)(1), unless the authorization is terminated  or revoked sooner.       Influenza A by PCR NEGATIVE NEGATIVE Final   Influenza B by PCR NEGATIVE NEGATIVE Final    Comment: (NOTE) The Xpert Xpress SARS-CoV-2/FLU/RSV plus assay is intended as an aid in the diagnosis of influenza from Nasopharyngeal swab specimens and should not be used as a sole basis for treatment. Nasal washings and aspirates are unacceptable for Xpert Xpress SARS-CoV-2/FLU/RSV testing.  Fact Sheet for Patients: EntrepreneurPulse.com.au  Fact Sheet for Healthcare  Providers: IncredibleEmployment.be  This test is not yet approved or cleared by the [place] FDA and has been authorized for detection and/or diagnosis of SARS-CoV-2 by FDA under an Emergency Use Authorization (EUA). This EUA will remain in effect (meaning this test can be used) for the duration of the COVID-19 declaration under Section 564(b)(1) of the Act, 21 U.S.C. section 360bbb-3(b)(1), unless the authorization is terminated or revoked.  Performed at [facility], [place] [address] [place], [place] [address]   Culture, blood (Routine x 2)     Status: None (Preliminary result)   Collection Time: 12/04/20  4:12 AM   Specimen: BLOOD RIGHT HAND  Result Value Ref Range Status   Specimen Description BLOOD RIGHT HAND  Final   Special Requests   Final    BOTTLES DRAWN AEROBIC AND ANAEROBIC Blood Culture results may not be optimal due to an inadequate volume of blood received in culture bottles   Culture   Final    NO GROWTH 2 DAYS Performed at [facility], [place] [address], [place], [place] [address]    Report Status PENDING  Incomplete    RADIOLOGY STUDIES/RESULTS: CT FEMUR RIGHT WO CONTRAST  Result Date: 12/05/2020 CLINICAL DATA:  Limping, right leg pain EXAM: CT OF THE LOWER RIGHT EXTREMITY WITHOUT CONTRAST TECHNIQUE: Multidetector CT imaging of the right lower extremity was performed according to the standard protocol. COMPARISON:  None. FINDINGS: Bones/Joint/Cartilage No fracture or dislocation. Normal alignment. No joint effusion. Left hip joint space is maintained. Knee joint spaces are maintained. Degenerative changes of the pubic symphysis. 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 Ligaments Ligaments are suboptimally evaluated by CT. Muscles and Tendons Muscles are normal.  No intramuscular  fluid collection or hematoma. Soft tissue No fluid collection or hematoma. No soft tissue mass. Soft tissue edema and skin thickening along the lateral aspect  of the upper right thigh and circumferential subcutaneous edema throughout the remainder of the thigh extending into the lower leg. Extensive peripheral vascular atherosclerotic disease. IMPRESSION: 1.  No acute osseous injury of the right femur. 2. Soft tissue edema and skin thickening along the lateral aspect of the upper right thigh and circumferential subcutaneous edema throughout the remainder of the thigh extending into the lower leg. This likely reflects reactive edema secondary to fluid overload and less likely cellulitis. 3. No drainable fluid collections. Electronically Signed   By: Kathreen Devoid   On: 12/05/2020 10:49   DG CHEST PORT 1 VIEW  Result Date: 12/05/2020 CLINICAL DATA:  Shortness of breath EXAM: PORTABLE CHEST 1 VIEW COMPARISON:  Dec 03, 2020 FINDINGS: The cardiomediastinal silhouette is unchanged and enlarged in contour.RIGHT chest AICD. Status post median sternotomy and CABG. LEFT IJ CVC tip terminates over the RIGHT atrium. No pleural effusion. No pneumothorax. Scattered linear opacities consistent with atelectasis. Perihilar vascular fullness without overt edema. Visualized abdomen is unremarkable. Multilevel degenerative changes of the thoracic spine. IMPRESSION: Cardiomegaly with pulmonary vascular congestion without overt edema. Electronically Signed   By: Valentino Saxon MD   On: 12/05/2020 14:42   VAS [name] LOWER EXTREMITY VENOUS (DVT)  Result Date: 12/05/2020  Lower Venous DVT Study Patient Name:  ALTIN BRISCO  Date of Exam:   12/05/2020 Medical Rec #: [id number]      Accession #:    [id number] Date of Birth: [birth year]      Patient Gender: M Patient Age:   65Y Exam Location:  [facility] Procedure:      VAS [name] LOWER EXTREMITY VENOUS (DVT) Referring Phys: [place] --------------------------------------------------------------------------------  Indications: Swelling, and Erythema.  Limitations: Edema. Comparison Study: No prior study on file Performing  Technologist: Sharion Dove RVS  Examination Guidelines: A complete evaluation includes B-mode imaging, spectral Doppler, color Doppler, and power Doppler as needed of all accessible portions of each vessel. Bilateral testing is considered an integral part of a complete examination. Limited examinations for reoccurring indications may be performed as noted. The reflux portion of the exam is performed with the patient in reverse Trendelenburg.  +---------+---------------+---------+-----------+----------+-------------------+ RIGHT    CompressibilityPhasicitySpontaneityPropertiesThrombus Aging      +---------+---------------+---------+-----------+----------+-------------------+ CFV      Full                                         pulsatile           +---------+---------------+---------+-----------+----------+-------------------+ SFJ      Full                                                             +---------+---------------+---------+-----------+----------+-------------------+ FV Prox  Full                                                             +---------+---------------+---------+-----------+----------+-------------------+  FV Mid   Full                                                             +---------+---------------+---------+-----------+----------+-------------------+ FV DistalFull                                                             +---------+---------------+---------+-----------+----------+-------------------+ PFV      Full                                                             +---------+---------------+---------+-----------+----------+-------------------+ POP      Full                                         pulsatile           +---------+---------------+---------+-----------+----------+-------------------+ PTV      Full                                                              +---------+---------------+---------+-----------+----------+-------------------+ PERO                                                  Not well visualized +---------+---------------+---------+-----------+----------+-------------------+   +----+---------------+---------+-----------+----------+--------------+ LEFTCompressibilityPhasicitySpontaneityPropertiesThrombus Aging +----+---------------+---------+-----------+----------+--------------+ CFV Full           Yes      No                                  +----+---------------+---------+-----------+----------+--------------+     Summary: RIGHT: - There is no evidence of deep vein thrombosis in the lower extremity. However, portions of this examination were limited- see technologist comments above.  LEFT: - No evidence of common femoral vein obstruction.  *See table(s) above for measurements and observations. Electronically signed by Monica Martinez MD on 12/05/2020 at 5:17:31 PM.    Final      LOS: 3 days   Oren Binet, MD  [facility]    To contact the attending provider between 7A-7P or the covering provider during after hours 7P-7A, please log into the web site [email or web address] and access using universal [place] password for that web site. If you do not have the password, please call the hospital operator.  12/06/2020, 11:04 AM

## 2020-12-06 NOTE — Progress Notes (Signed)
Mobility Specialist: Progress Note   12/06/20 1804  Mobility  Activity Transferred:  Chair to bed  Level of Assistance +2 (takes two people)  Information systems manager Ambulated (ft) 2 ft  Mobility Out of bed to chair with meals  Mobility Response Tolerated fair  Mobility performed by Mobility specialist;Nurse  $Mobility charge 1 Mobility   Post-Mobility: 119 HR, 95% SpO2  Pt was +3 assist to transfer from chair to bed between RN's and myself. Pt seemed more lethargic during this transfer than previous. Pt back to bed with call bell at his side, RN present in room.   [facility] Samiel Peel Mobility Specialist Mobility Specialist Phone: [phone]

## 2020-12-06 NOTE — Plan of Care (Signed)
  Problem: Health Behavior/Discharge Planning: Goal: Ability to manage health-related needs will improve 12/06/2020 0456 by Peggye Pitt, RN Outcome: Not Progressing 12/06/2020 0159 by Peggye Pitt, RN Outcome: Not Progressing   Problem: Clinical Measurements: Goal: Will remain free from infection Outcome: Not Progressing Goal: Respiratory complications will improve Outcome: Not Progressing

## 2020-12-06 NOTE — Plan of Care (Signed)
?  Problem: Health Behavior/Discharge Planning: ?Goal: Ability to manage health-related needs will improve ?Outcome: Not Progressing ?  ?Problem: Clinical Measurements: ?Goal: Will remain free from infection ?Outcome: Not Progressing ?Goal: Respiratory complications will improve ?Outcome: Not Progressing ?  ?

## 2020-12-06 NOTE — Progress Notes (Signed)
Mobility Specialist: Progress Note   12/06/20 1608  Mobility  Activity Transferred:  Bed to chair  Level of Assistance +2 (takes two people)  Games developer wheel walker  Distance Ambulated (ft) 2 ft  Mobility Out of bed to chair with meals  Mobility Response Tolerated fair  Mobility performed by Mobility specialist;Nurse  Bed Position Chair  $Mobility charge 1 Mobility   Pt was modA to maxA to sit EOB from supine. Pt required frequent verbal cues for hand placement for trunk support while sitting EOB and RW management during transfer. Pt is sitting in chair with RN and NT present in room to change bedding and assist pt in getting cleaned up. Will f/u later today to assist with transferring pt back to bed.   [facility] Sebastian Dzik Mobility Specialist Mobility Specialist Phone: [phone]

## 2020-12-06 NOTE — Consult Note (Signed)
Neurology Consultation Reason for Consult: Right leg pain Requesting Physician: Oren Binet   CC: Right leg pain  History is obtained from: Patient and chart review   HPI: Kyle Walton is a 65 y.o. male with a past medical history significant for paroxysmal atrial fibrillation on anticoagulation, diabetes C/B multiple toe amputations (retains the last 3 digits of the left foot only), ESRD on dialysis, CAD complicated by MI, obstructive sleep apnea (compliant with CPAP), obesity (BMI 39.26).  He initially presented with leg pain severe enough to prevent him from going to dialysis and was found to have purulent right leg cellulitis meeting criteria for sepsis with leukocytosis to 18.3.  He was started on vancomycin and Rocephin.  CT of the right lower extremity showed soft tissue edema/skin thickening along the lateral aspect of the upper right thigh.  However the appearance of the cellulitis has improved with reduced erythema/swelling.  He was encephalopathic in the setting of using opiates for the right thigh pain which resolved with Narcan and minimizing further opiates.  He has also been requiring midodrine for low blood pressures especially for dialysis.  His wife notes that in the 11 months that they have been married she has called 911 more than 20 times and he has been in and out of the hospital due to various illnesses.  She reports that her baseline minimum for feeling like he is safe at home is that he should be able to transfer from his bed to his hover chair which allows him to transfer to the bus for dialysis.  She notes that she was aware he was having right leg pain, but neither she nor her husband were able to detect his cellulitis secondary to severe vision impairment.  Additionally she reports that his asterixis is an ongoing problem and intermittently interferes with him being able to feed himself  ROS: Somewhat limited by the patient's mental status, but he denies current  headache, complains of current nausea/vomiting, poor appetite but no weight loss  Past Medical History:  Diagnosis Date  . Diabetes mellitus without complication ([place])   . Diastolic heart failure ([place])   . ESRD (end stage renal disease) ([place])   . MI (myocardial infarction) ([place])   . OSA (obstructive sleep apnea)   . PAF (paroxysmal atrial fibrillation) ([place])   . Renal disorder    on dialysis   Past Surgical History:  Procedure Laterality Date  . CORONARY ARTERY BYPASS GRAFT  2017   LIMA LAD, SVG PDA, OM3  . DIALYSIS FISTULA CREATION    . TOE AMPUTATION Bilateral    Current Outpatient Medications  Medication Instructions  . amiodarone (PACERONE) 200 mg, Oral, Daily  . amLODipine (NORVASC) 2.5 mg, Oral, Daily  . atorvastatin (LIPITOR) 40 mg, Oral, Daily  . Basaglar KwikPen 40 Units, Subcutaneous, Daily at bedtime  . calcium acetate (PHOSLO) 1,334 mg, Oral, 3 times daily with meals  . cholestyramine (QUESTRAN) 4 g packet 1 packet, Oral, 2 times daily  . clopidogrel (PLAVIX) 75 mg, Oral, Daily  . Eliquis 2.5 mg, Oral, 2 times daily  . ergocalciferol (VITAMIN D2) 50,000 Units, Oral, Every 30 days  . gabapentin (NEURONTIN) 100 mg, Oral, 2 times daily  . hydrOXYzine (ATARAX/VISTARIL) 25 mg, Oral, 2 times daily  . insulin lispro (HUMALOG) 100 UNIT/ML KwikPen Subcutaneous, Inject two Units to twelve Units into the skin 15 (fifteen) minutes before meals for 30 days. 180-200 = 2 units 201-250 = 5 units 251-300 = 7 units 301-400 = 12 units  and call MD   . isosorbide mononitrate (IMDUR) 30 mg, Oral, Daily  . latanoprost (XALATAN) 0.005 % ophthalmic solution 1 drop, Both Eyes, Daily at bedtime  . levothyroxine (SYNTHROID) 12.5 mcg, Oral, Daily before breakfast  . metoprolol tartrate (LOPRESSOR) 50 mg, Oral, 2 times daily  . mupirocin ointment (BACTROBAN) 2 % 1 application, Topical, As directed, Apply 2-3 times daily  . nitroGLYCERIN (NITROSTAT) 0.3 mg, Sublingual, Every 5 min PRN  .  pantoprazole (PROTONIX) 40 MG tablet 1 tablet, Oral, Daily  . sodium bicarbonate 325 mg, Oral, 2 times daily    Current Facility-Administered Medications:  .  acetaminophen (TYLENOL) tablet 1,000 mg, 1,000 mg, Oral, Q8H, Ghimire, Shanker M, MD, 1,000 mg at 12/06/20 1316 .  amiodarone (PACERONE) tablet 200 mg, 200 mg, Oral, Daily, Norins, Heinz Knuckles, MD, 200 mg at 12/06/20 1313 .  apixaban (ELIQUIS) tablet 2.5 mg, 2.5 mg, Oral, BID, Norins, Heinz Knuckles, MD, 2.5 mg at 12/06/20 1313 .  atorvastatin (LIPITOR) tablet 40 mg, 40 mg, Oral, Daily, Norins, Heinz Knuckles, MD, 40 mg at 12/06/20 1313 .  calcium acetate (PHOSLO) capsule 1,334 mg, 1,334 mg, Oral, TID WC, Norins, Heinz Knuckles, MD, 1,334 mg at 12/06/20 1623 .  Chlorhexidine Gluconate Cloth 2 % PADS 6 each, 6 each, Topical, Q0600, Dwana Melena, MD, 6 each at 12/06/20 [phone] .  cholestyramine light (PREVALITE) packet 4 g, 4 g, Oral, QID, Ghimire, Henreitta Leber, MD, 4 g at 12/06/20 1626 .  clopidogrel (PLAVIX) tablet 75 mg, 75 mg, Oral, Daily, Norins, Heinz Knuckles, MD, 75 mg at 12/06/20 1313 .  doxercalciferol (HECTOROL) injection 2 mcg, 2 mcg, Intravenous, Q M,W,F-HD, Dwana Melena, MD, 2 mcg at 12/06/20 1211 .  hydrOXYzine (ATARAX/VISTARIL) tablet 25 mg, 25 mg, Oral, BID PRN, Opyd, Ilene Qua, MD, 25 mg at 12/06/20 1315 .  insulin aspart (novoLOG) injection 0-15 Units, 0-15 Units, Subcutaneous, TID WC, Norins, Heinz Knuckles, MD, 3 Units at 12/06/20 1626 .  insulin aspart (novoLOG) injection 3 Units, 3 Units, Subcutaneous, TID WC, Norins, Heinz Knuckles, MD, 3 Units at 12/06/20 1625 .  insulin glargine (LANTUS) injection 40 Units, 40 Units, Subcutaneous, QHS, Norins, Heinz Knuckles, MD, 40 Units at 12/05/20 2312 .  levothyroxine (SYNTHROID) tablet 12.5 mcg, 12.5 mcg, Oral, QAC breakfast, Norins, Heinz Knuckles, MD, 12.5 mcg at 12/06/20 0524 .  lidocaine (LIDODERM) 5 % 1 patch, 1 patch, Transdermal, Q24H, Ghimire, Henreitta Leber, MD, 1 patch at 12/06/20 1312 .  metoprolol tartrate (LOPRESSOR)  tablet 25 mg, 25 mg, Oral, BID, Ghimire, Henreitta Leber, MD, 25 mg at 12/06/20 1313 .  midodrine (PROAMATINE) tablet 10 mg, 10 mg, Oral, TID WC, Ghimire, Shanker M, MD, 10 mg at 12/06/20 1625 .  naloxone [facility]) injection 0.4 mg, 0.4 mg, Intravenous, PRN, Jonetta Osgood, MD, 0.4 mg at 12/05/20 1313 .  nitroGLYCERIN (NITROSTAT) SL tablet 0.4 mg, 0.4 mg, Sublingual, Q5 min PRN, Norins, Heinz Knuckles, MD .  ondansetron (ZOFRAN) injection 4 mg, 4 mg, Intravenous, Q6H PRN, Norins, Heinz Knuckles, MD .  pantoprazole (PROTONIX) EC tablet 40 mg, 40 mg, Oral, Daily, Norins, Heinz Knuckles, MD, 40 mg at 12/06/20 1313 .  pregabalin (LYRICA) capsule 25 mg, 25 mg, Oral, Daily, Ghimire, Henreitta Leber, MD, 25 mg at 12/06/20 1316 .  sodium bicarbonate tablet 325 mg, 325 mg, Oral, BID, Norins, Heinz Knuckles, MD, 325 mg at 12/06/20 1313 .  vancomycin (VANCOREADY) IVPB 1000 mg/200 mL, 1,000 mg, Intravenous, Q M,W,F-HD, Llana Aliment, RPH  History reviewed. No pertinent  family history.  Social History:  reports that he has never smoked. He has never used smokeless tobacco. He reports previous alcohol use. He reports previous drug use.   Exam: Current vital signs: BP 109/71 (BP Location: Right Arm)   Pulse (!) 101   Temp 97.7 F (36.5 C) (Oral)   Resp 20   Ht '5\' 10"'$  (1.778 m)   Wt 124.1 kg   SpO2 91%   BMI 39.26 kg/m  Vital signs in last 24 hours: Temp:  [97.4 F (36.3 C)-98.1 F (36.7 C)] 97.7 F (36.5 C) (05/30 1250) Pulse Rate:  [100-119] 101 (05/30 1230) Resp:  [14-24] 20 (05/30 1250) BP: (79-115)/(40-71) 109/71 (05/30 1250) SpO2:  [89 %-100 %] 91 % (05/30 1250) Weight:  [124.1 kg-126.1 kg] 124.1 kg (05/30 1230)   Physical Exam  Constitutional: Chronically ill.  Intermittently coughing/spitting frothy sputum into an emesis bag. Psych: Anxious when awake Eyes: No scleral injection HENT: No oropharyngeal obstruction.  Poor dentition MSK: Retains only in the last 3 toes on the left foot, no toes on the right.   Cardiovascular: Mild tachycardia, but sinus rhythm on monitor Respiratory: Effort normal, non-labored breathing GI: Soft.  Protuberant.  There is no tenderness.  Skin: Right lower extremity was recently bandaged.  Lidocaine patch on right upper extremity.  Healing scabs on his forehead from prior MRSA infection as well as on the bilateral feet, worse on the right than the left foot.  There is significant pitting edema of the right thigh compared to the left thigh appreciable on palpation (which does elicit severe allodynia)  Neuro: Mental Status: Patient is very sleepy, intermittently falling asleep during examination, alert, oriented to person, place, memorial day and 2022 although he initially reported it was June, and situation Patient is able to give limited history due to perseveration and frequently falling asleep during examination No signs of aphasia or neglect Cranial Nerves: II: Pupils are equal and round.  Visual fields not tested as patient is legally blind III,IV, VI: EOMI without ptosis though he struggles to bury his gaze to either side V: Facial sensation is symmetric to temperature VII: Facial movement is symmetric.  VIII: hearing is intact to voice X: Uvula elevates symmetrically XI: Shoulder shrug is symmetric. XII: tongue is midline without atrophy or fasciculations.  Motor: Tone is normal. Bulk is normal. 5/5 strength throughout the upper extremities. Right lower extremity is extremely pain limited for hip flexion but at least 1/5 for hip flexion, 4/5 knee extension, patient deferred flexion secondary to pain, 4+/5 dorsiflexion, 5/5 plantarflexion Left lower extremity is 2/5 for hip flexion, 4/5 in both knee extension and flexion, 5/5 in both plantar and dorsiflexion Sensory: Sensation is symmetric to light touch and temperature in the arms and legs, reduced in a length dependent fashion.  Significant allodynia to touch in the lateral thigh, without involvement of the  medial thigh, extending down to the knee but not involving the entirety of the popliteal fossa.  No allodynia in the calf or shin Deep Tendon Reflexes: 2+ and symmetric in the biceps and patellae.  There is initially some guarding at the right patella but when he relaxes I am able to elicit a patellar reflex Plantars: No clear movement of the toes bilaterally though most of them are amputated Cerebellar: Finger-nose testing generally intact within limits of vision loss although also limited secondary to asterixis which is potentially slightly worse in the left upper extremity versus were secondary to patient falling asleep  I have  reviewed labs in epic and the results pertinent to this consultation are:   Basic Metabolic Panel: Recent Labs  Lab 12/03/20 1311 12/04/20 0412 12/05/20 0721 12/06/20 0042 12/06/20 0700  NA 135 136 136 134*  --   K 4.6 4.2 4.6 4.7  --   CL 99 99 100 98  --   CO2 19* 23 20* 22  --   GLUCOSE 278* 142* 119* 196*  --   BUN 57* 30* 43* 54*  --   CREATININE 9.94* 6.10* 7.54* 8.64*  --   CALCIUM 8.3* 8.3* 8.4* 8.3*  --   PHOS  --   --  6.9*  --  7.9*    CBC: Recent Labs  Lab 12/03/20 1311 12/04/20 0412 12/05/20 0721 12/06/20 0042  WBC 18.3* 16.2* 17.3* 15.3*  NEUTROABS 15.8*  --   --   --   HGB 10.9* 10.2* 9.8* 9.4*  HCT 33.9* 32.0* 30.9* 29.9*  MCV 95.0 94.1 93.9 94.0  PLT 148* 145* 133* 149*    Coagulation Studies: Recent Labs    12/04/20 0412  LABPROT 18.4*  INR 1.5*       I have reviewed the images obtained: CT of the right femur with soft tissue edema and skin thickening along the lateral aspect of the upper right thigh extending into the lower leg, personally reviewed. 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  Weakness seems mostly secondary to pain and potentially some component of deconditioning/chronic neuropathy given that he does appear to have worse peripheral vascular disease in the right lower extremity compared to the left (more amputations, and more nonhealing wounds).  Anticipate that symptoms will improve as the thigh edema improves, though he may be left with residual numbness.  While diabetic amyotrophy is on the differential, at this time the symptoms are limited exclusively to the leg with significant edema and the examination though pain limited appears to support involvement only on the lateral cutaneous femoral nerve without true weakness or progressive symptoms.  Additionally there are no clear data that immunosuppressive treatment is beneficial in these cases, and while there is theoretical benefit, steroids would be very high risk in this patient recovering from sepsis secondary to cellulitis and IVIG is contraindicated in the setting of end-stage renal disease on dialysis.  However given his recent infection, and persistent edema, prudent to obtain pelvic imaging to exclude a compressive abscess.     His asterixis is chronic and likely related to his end-stage renal disease  Recommendations:  - MRI pelvis without contrast; if pacer is not compatible will change order to CT study, likely without contrast due to current contrast shortage -Topical lidocaine in the entirety of the painful area, applied 3 times daily -Pain continues to be significantly limiting patient's ability to participate unnecessary treatment such as dialysis, please reach out to anesthesiology for consideration of peripheral nerve block of the lateral cutaneous femoral nerve (discussed with patient this may not be feasible given concern for cellulitis) -Patient counseled to additionally reduce edema by moving the leg is much as possible and then elevating the leg when resting -Continued excellent management of  cellulitis per primary team -Outpatient neurology follow-up for EMG/nerve conduction study if symptoms persist greater than 3 weeks (mid June or later given symptom onset about 1  week ago); referral placed -Neurology will follow up results of imaging but otherwise will be available on an as-needed basis going forward.  Please reach out if issues arise  Lesleigh Noe MD-PhD [facility] [phone] Available 7 AM to 7 PM, outside these hours please contact Neurologist on call listed on AMION

## 2020-12-07 DIAGNOSIS — L03115 Cellulitis of right lower limb: Secondary | ICD-10-CM | POA: Diagnosis not present

## 2020-12-07 DIAGNOSIS — N186 End stage renal disease: Secondary | ICD-10-CM | POA: Diagnosis not present

## 2020-12-07 DIAGNOSIS — I5032 Chronic diastolic (congestive) heart failure: Secondary | ICD-10-CM | POA: Diagnosis not present

## 2020-12-07 DIAGNOSIS — E1151 Type 2 diabetes mellitus with diabetic peripheral angiopathy without gangrene: Secondary | ICD-10-CM | POA: Diagnosis not present

## 2020-12-07 LAB — RENAL FUNCTION PANEL
Albumin: 2.5 g/dL — ABNORMAL LOW (ref 3.5–5.0)
Anion gap: 13 (ref 5–15)
BUN: 30 mg/dL — ABNORMAL HIGH (ref 8–23)
CO2: 24 mmol/L (ref 22–32)
Calcium: 8.1 mg/dL — ABNORMAL LOW (ref 8.9–10.3)
Chloride: 95 mmol/L — ABNORMAL LOW (ref 98–111)
Creatinine, Ser: 5.56 mg/dL — ABNORMAL HIGH (ref 0.61–1.24)
GFR, Estimated: 11 mL/min — ABNORMAL LOW (ref >60)
Glucose, Bld: 127 mg/dL — ABNORMAL HIGH (ref 70–99)
Phosphorus: 5.1 mg/dL — ABNORMAL HIGH (ref 2.5–4.6)
Potassium: 4 mmol/L (ref 3.5–5.1)
Sodium: 132 mmol/L — ABNORMAL LOW (ref 135–145)

## 2020-12-07 LAB — CBC
HCT: 31.6 % — ABNORMAL LOW (ref 39.0–52.0)
Hemoglobin: 10.2 g/dL — ABNORMAL LOW (ref 13.0–17.0)
MCH: 29.9 pg (ref 26.0–34.0)
MCHC: 32.3 g/dL (ref 30.0–36.0)
MCV: 92.7 fL (ref 80.0–100.0)
Platelets: 156 10*3/uL (ref 150–400)
RBC: 3.41 MIL/uL — ABNORMAL LOW (ref 4.22–5.81)
RDW: 16.7 % — ABNORMAL HIGH (ref 11.5–15.5)
WBC: 16 10*3/uL — ABNORMAL HIGH (ref 4.0–10.5)
nRBC: 0.1 % (ref 0.0–0.2)

## 2020-12-07 LAB — HEMOGLOBIN A1C
Hgb A1c MFr Bld: 9.7 % — ABNORMAL HIGH (ref 4.8–5.6)
Mean Plasma Glucose: 232 mg/dL

## 2020-12-07 LAB — GLUCOSE, CAPILLARY
Glucose-Capillary: 144 mg/dL — ABNORMAL HIGH (ref 70–99)
Glucose-Capillary: 118 mg/dL — ABNORMAL HIGH (ref 70–99)
Glucose-Capillary: 90 mg/dL (ref 70–99)
Glucose-Capillary: 83 mg/dL (ref 70–99)
Glucose-Capillary: 88 mg/dL (ref 70–99)

## 2020-12-07 MED ORDER — LORAZEPAM 2 MG/ML IJ SOLN
0.5000 mg | Freq: Once | INTRAMUSCULAR | Status: AC
  Administered 2020-12-07: 0.5 mg via INTRAVENOUS
  Filled 2020-12-07: qty 1

## 2020-12-07 MED ORDER — DARBEPOETIN ALFA 100 MCG/0.5ML IJ SOSY: 75.0000 ug | PREFILLED_SYRINGE | INTRAMUSCULAR | Status: DC

## 2020-12-07 MED ORDER — TRAMADOL HCL 50 MG PO TABS
25.0000 mg | ORAL_TABLET | Freq: Once | ORAL | Status: AC
  Administered 2020-12-07: 25 mg via ORAL
  Filled 2020-12-07: qty 1

## 2020-12-07 NOTE — Progress Notes (Signed)
Patients wife requested an update from MD. MD notified.  Daymon Larsen, RN

## 2020-12-07 NOTE — TOC Initial Note (Signed)
Transition of Care [facility]) - Initial/Assessment Note    Patient Details  Name: Kyle Walton MRN: [id number] Date of Birth: 1956/01/06  Transition of Care [facility] CM/SW Contact:    Carles Collet, RN Phone Number: 12/07/2020, 4:35 PM  Clinical Narrative:      Could not reach patient on phone. Spoke w wife over the phone. She states that at baseline they are both use electric scooters at home. She referred to herself as blind and crippled. She states that although she feels like he won't get the best care at SNF, she feels like she cannot care for him at home, and won't be able to help him physically to the extent he would require.  She is agreeable to a SNF search, and states that although she want to see him, if a SNF is available that would give him "better care" that is farther away (outside [facility]), she would want him to go there.    Updated CSW.             Expected Discharge Plan: Skilled Nursing Facility Barriers to Discharge: Continued Medical Work up   Patient Goals and CMS Choice Patient states their goals for this hospitalization and ongoing recovery are:: rehabiltion, per wife      Expected Discharge Plan and Services Expected Discharge Plan: [place]                                              Prior Living Arrangements/Services                       Activities of Daily Living Home Assistive Devices/Equipment: Other (Comment) Production assistant, radio) ADL Screening (condition at time of admission) Patient's cognitive ability adequate to safely complete daily activities?: No Is the patient deaf or have difficulty hearing?: No Does the patient have difficulty seeing, even when wearing glasses/contacts?: Yes Does the patient have difficulty concentrating, remembering, or making decisions?: No Patient able to express need for assistance with ADLs?: Yes Does the patient have difficulty dressing or bathing?: Yes Independently  performs ADLs?: No Communication: Independent Dressing (OT): Needs assistance Grooming: Needs assistance Feeding: Independent Bathing: Needs assistance Toileting: Needs assistance In/Out Bed: Needs assistance Walks in Home: Needs assistance Does the patient have difficulty walking or climbing stairs?: Yes Weakness of Legs: Both Weakness of Arms/Hands: None  Permission Sought/Granted                  Emotional Assessment              Admission diagnosis:  Cellulitis of right leg [L03.115] Cellulitis of right lower extremity [L03.115] Sepsis ([place]) [A41.9] Patient Active Problem List   Diagnosis Date Noted  . Cellulitis of right leg 12/03/2020  . Sepsis ([place]) 12/03/2020  . Aggressive behavior   . CAD (coronary artery disease) 10/27/2020  . 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  . Cardiopulmonary arrest with successful resuscitation ([place]) 10/27/2020  . Diabetes mellitus with peripheral vascular disease ([place]) 10/27/2020  . History of DVT (deep vein thrombosis) 10/27/2020  . Essential hypertension 10/27/2020  . OSA (obstructive sleep apnea) 10/27/2020  . Paroxysmal atrial fibrillation ([place]) 10/27/2020  . Secondary hyperparathyroidism, renal ([place]) 10/27/2020  . 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 Chronic diastolic congestive heart failure, NYHA class 4 ([place]) 10/27/2020  . ESRD  on hemodialysis ([place]) 10/27/2020  . Impaired physical mobility 10/27/2020  . Chronic respiratory failure with hypercapnia ([place]) 10/27/2020  . 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 History of major depression 10/27/2020  . History of macular degeneration 10/27/2020  . Legally blind 10/27/2020   PCP:  Patient, No Pcp Per (Inactive) Pharmacy:   [facility], [address] [place] [address] Phone: [phone] Fax: [phone]     Social Determinants of Health (SDOH)  Interventions    Readmission Risk Interventions No flowsheet data found.

## 2020-12-07 NOTE — Progress Notes (Addendum)
PROGRESS NOTE        PATIENT DETAILS Name: Kyle Walton Age: 65 y.o. Sex: male Date of Birth: October 09, 1955 Admit Date: 12/03/2020 Admitting Physician Neena Rhymes, MD [id number], No Pcp Per (Inactive)  Brief Narrative: Patient is a 65 y.o. male PAF, CAD s/p CABG, AICD implantation, PAD-s/p numerous toe amputations, HTN, ESRD on HD MWF-admitted for right leg soft tissue infection and right upper thigh pain.  See below for further details.  Significant events: 5/27>> admit for RLE pain/right leg cellulitis. 5/29>> lethargy/encephalopathy-significant improvement post Narcan  Significant studies: 5/27>> x-ray right foot: No evidence of fracture/focal bone lesions. 5/27>> chest x-ray: Mild pulmonary vascular congestion. 5/29>> RLE Doppler: No DVT 5/29>> CT right lower extremity: Soft tissue edema/skin thickening along the lateral aspect of the upper right thigh.  No drainable fluid collection.  Antimicrobial therapy: Vancomycin: 5/27>> Cefepime: 5/27>>5/29  Microbiology data: 5/27>> Influenza/COVID PCR: Negative 5/27>> blood culture: No growth 5/28>> blood culture:No growth  Procedures : None  Consults: Nephrology, neurology  DVT Prophylaxis : apixaban (ELIQUIS) tablet 2.5 mg Start: 12/03/20 2200 apixaban (ELIQUIS) tablet 2.5 mg    Subjective: Continues to have right thigh pain-lateral aspect.  No other issues overnight.  Assessment/Plan: Sepsis due to RLE cellulitis: Sepsis physiology has improved-minimal erythema in RLE-wounds are stable.  Cultures negative so far.  Plan on vancomycin x7 days total.    Right lateral thigh pain: Unclear etiology-CT without any soft tissue collection-but showing some skin/soft tissue edema-which is unlikely to be the cause of this much of pain.  Due to concern for diabetic amyotrophy-neurology consulted-recommendations are to proceed with a MRI pelvis.  Discussed with neurology-not a candidate for advanced  therapies including IVIG etc.  Continue transdermal Lidoderm-and Lyrica.  Acute toxic encephalopathy: Occurred on 5/29-given 1 dose of Narcan.  Currently completely awake and alert.  Minimize narcotics as much as possible.    ESRD on HD MWF: Nephrology following.  HTN: BP on the softer/lower side-continue midodrine-continue low-dose metoprolol.    Chronic diastolic heart failure: Volume status relatively stable-diuresis with HD.  CAD-s/p CABG 2017: No anginal symptoms-on Plavix/statin/beta-blocker  History of [id number] amiodarone, metoprolol and Eliquis.  History of AICD implantation  History of PAD-s/p multiple toe amputations bilaterally  DM-2: CBG stable-continue Lantus 40 units daily, 3 units of NovoLog with meals and SSI.  Follow and adjust  Recent Labs    12/06/20 2108 12/07/20 0610 12/07/20 1158  GLUCAP 127* 118* 90   Hypothyroidism: Continue Synthroid  OSA: Continue CPAP nightly  Legally blind  Morbid Obesity: Estimated body mass index is 39.26 kg/m as calculated from the following:   Height as of this encounter: '5\' 10"'$  (1.778 m).   Weight as of this encounter: 124.1 kg.    Diet: Diet Order            Diet renal/carb modified with fluid restriction Diet-HS Snack? Nothing; Fluid restriction: 1200 mL Fluid; Room service appropriate? Yes with Assist; Fluid consistency: Thin  Diet effective now                  Code Status: Full code   Family Communication: Delcie Roch (spouse)-[phone]-spoke over the phone on 5/31  Disposition Plan: Status is: Inpatient  Remains inpatient appropriate because:Inpatient level of care appropriate due to severity of illness   Dispo: The patient is from: Home  Anticipated d/c is to: Home              Patient currently is not medically stable to d/c.   Difficult to place patient No     Barriers to Discharge: RLE pain/cellulitis-on IV antibiotics.  Antimicrobial agents: Anti-infectives (From  admission, onward)   Start     Dose/Rate Route Frequency Ordered Stop   12/06/20 1203  vancomycin (VANCOCIN) 1-5 GM/200ML-% IVPB       Note to Pharmacy: Wallace Cullens   : cabinet override      12/06/20 1203 12/06/20 1206   12/04/20 1030  vancomycin (VANCOREADY) IVPB 1000 mg/200 mL        1,000 mg 200 mL/hr over 60 Minutes Intravenous  Once 12/04/20 0930 12/04/20 1230   12/03/20 2300  vancomycin (VANCOREADY) IVPB 1000 mg/200 mL        1,000 mg 200 mL/hr over 60 Minutes Intravenous Every M-W-F (Hemodialysis) 12/03/20 2019 12/09/20 2359   12/03/20 1830  ceFEPIme (MAXIPIME) 1 g in sodium chloride 0.9 % 100 mL IVPB  Status:  Discontinued        1 g 200 mL/hr over 30 Minutes Intravenous Every 24 hours 12/03/20 1802 12/05/20 1630   12/03/20 1800  vancomycin (VANCOREADY) IVPB 1000 mg/200 mL  Status:  Discontinued        1,000 mg 200 mL/hr over 60 Minutes Intravenous  Once 12/03/20 1756 12/03/20 1801   12/03/20 1300  vancomycin (VANCOREADY) IVPB 1750 mg/350 mL        1,750 mg 175 mL/hr over 120 Minutes Intravenous  Once 12/03/20 1252 12/03/20 1706   12/03/20 1300  cefTRIAXone (ROCEPHIN) 2 g in sodium chloride 0.9 % 100 mL IVPB        2 g 200 mL/hr over 30 Minutes Intravenous  Once 12/03/20 1252 12/03/20 1345       Time spent: 35 minutes-Greater than 50% of this time was spent in counseling, explanation of diagnosis, planning of further management, and coordination of care.  MEDICATIONS: Scheduled Meds: . acetaminophen  1,000 mg Oral Q8H  . amiodarone  200 mg Oral Daily  . apixaban  2.5 mg Oral BID  . atorvastatin  40 mg Oral Daily  . calcium acetate  1,334 mg Oral TID WC  . Chlorhexidine Gluconate Cloth  6 each Topical Q0600  . cholestyramine light  4 g Oral QID  . clopidogrel  75 mg Oral Daily  . [START ON 12/08/2020] darbepoetin (ARANESP) injection - DIALYSIS  76 mcg Intravenous Q Wed-HD  . doxercalciferol  2 mcg Intravenous Q M,W,F-HD  . insulin aspart  0-15 Units Subcutaneous  TID WC  . insulin aspart  3 Units Subcutaneous TID WC  . insulin glargine  40 Units Subcutaneous QHS  . levothyroxine  12.5 mcg Oral QAC breakfast  . lidocaine   Topical TID  . metoprolol tartrate  25 mg Oral BID  . midodrine  10 mg Oral TID WC  . pantoprazole  40 mg Oral Daily  . pregabalin  25 mg Oral Daily  . sodium bicarbonate  325 mg Oral BID   Continuous Infusions: . vancomycin     PRN Meds:.hydrOXYzine, naLOXone (NARCAN)  injection, nitroGLYCERIN, ondansetron (ZOFRAN) IV   PHYSICAL EXAM: Vital signs: Vitals:   12/07/20 0730 12/07/20 1100 12/07/20 1225 12/07/20 1226  BP: (!) 89/61 105/73 100/61 100/61  Pulse: 88 100 69 69  Resp: '18 20 20 20  '$ Temp: 98 F (36.7 C) 98.4 F (36.9 C)    TempSrc: Axillary  Oral Oral   SpO2: 98% 92% 96% 96%  Weight:      Height:       Filed Weights   12/03/20 1230 12/06/20 0809 12/06/20 1230  Weight: 117.9 kg 126.1 kg 124.1 kg   Body mass index is 39.26 kg/m.   Gen Exam:Alert awake-not in any distress HEENT:atraumatic, normocephalic Chest: B/L clear to auscultation anteriorly CVS:S1S2 regular Abdomen:soft non tender, non distended Extremities: Right lateral thigh area continues to be tender-very minimal erythema today. Neurology: Non focal Skin: no rash   I have personally reviewed following labs and imaging studies  LABORATORY DATA: CBC: Recent Labs  Lab 12/03/20 1311 12/04/20 0412 12/05/20 0721 12/06/20 0042 12/07/20 0056  WBC 18.3* 16.2* 17.3* 15.3* 16.0*  NEUTROABS 15.8*  --   --   --   --   HGB 10.9* 10.2* 9.8* 9.4* 10.2*  HCT 33.9* 32.0* 30.9* 29.9* 31.6*  MCV 95.0 94.1 93.9 94.0 92.7  PLT 148* 145* 133* 149* [id number]    Basic Metabolic Panel: Recent Labs  Lab 12/03/20 1311 12/04/20 0412 12/05/20 0721 12/06/20 0042 12/06/20 0700 12/07/20 0056  NA 135 136 136 134*  --  132*  K 4.6 4.2 4.6 4.7  --  4.0  CL 99 99 100 98  --  95*  CO2 19* 23 20* 22  --  24  GLUCOSE 278* 142* 119* 196*  --  127*  BUN 57* 30*  43* 54*  --  30*  CREATININE 9.94* 6.10* 7.54* 8.64*  --  5.56*  CALCIUM 8.3* 8.3* 8.4* 8.3*  --  8.1*  PHOS  --   --  6.9*  --  7.9* 5.1*    GFR: Estimated Creatinine Clearance: 17.5 mL/min (A) (by C-G formula based on SCr of 5.56 mg/dL (H)).  Liver Function Tests: Recent Labs  Lab 12/03/20 1311 12/05/20 0721 12/06/20 0042 12/07/20 0056  AST 38  --  44*  --   ALT 12  --  15  --   ALKPHOS 77  --  66  --   BILITOT 1.3*  --  1.0  --   PROT 6.8  --  5.7*  --   ALBUMIN 2.9* 2.4* 2.3* 2.5*   No results for input(s): LIPASE, AMYLASE in the last 168 hours. Recent Labs  Lab 12/06/20 0042  AMMONIA 56*    Coagulation Profile: Recent Labs  Lab 12/03/20 1311 12/04/20 0412  INR 1.6* 1.5*    Cardiac Enzymes: No results for input(s): CKTOTAL, CKMB, CKMBINDEX, TROPONINI in the last 168 hours.  BNP (last 3 results) No results for input(s): PROBNP in the last 8760 hours.  Lipid Profile: No results for input(s): CHOL, HDL, LDLCALC, TRIG, CHOLHDL, LDLDIRECT in the last 72 hours.  Thyroid Function Tests: No results for input(s): TSH, T4TOTAL, FREET4, T3FREE, THYROIDAB in the last 72 hours.  Anemia Panel: No results for input(s): VITAMINB12, FOLATE, FERRITIN, TIBC, IRON, RETICCTPCT in the last 72 hours.  Urine analysis: No results found for: COLORURINE, APPEARANCEUR, LABSPEC, PHURINE, GLUCOSEU, HGBUR, BILIRUBINUR, KETONESUR, PROTEINUR, UROBILINOGEN, NITRITE, LEUKOCYTESUR  Sepsis Labs: Lactic Acid, Venous    Component Value Date/Time   LATICACIDVEN 1.9 12/03/2020 1444    MICROBIOLOGY: Recent Results (from the past 240 hour(s))  Culture, blood (Routine x 2)     Status: None (Preliminary result)   Collection Time: 12/03/20 12:32 PM   Specimen: BLOOD  Result Value Ref Range Status   Specimen Description BLOOD RIGHT ANTECUBITAL  Final   Special Requests   Final  BOTTLES DRAWN AEROBIC AND ANAEROBIC Blood Culture adequate volume   Culture   Final    NO GROWTH 4  DAYS Performed at [facility], [place] [address] [place], [place] [address]    Report Status PENDING  Incomplete  Resp Panel by RT-PCR (Flu A&B, Covid) Nasopharyngeal Swab     Status: None   Collection Time: 12/03/20 12:52 PM   Specimen: Nasopharyngeal Swab; Nasopharyngeal(NP) swabs in vial transport medium  Result Value Ref Range Status   SARS Coronavirus 2 by RT PCR NEGATIVE NEGATIVE Final    Comment: (NOTE) SARS-CoV-2 target nucleic acids are NOT DETECTED.  The SARS-CoV-2 RNA is generally detectable in upper respiratory specimens during the acute phase of infection. The lowest concentration of SARS-CoV-2 viral copies this assay can detect is 138 copies/mL. A negative result does not preclude SARS-Cov-2 infection and should not be used as the sole basis for treatment or other patient management decisions. A negative result may occur with  improper specimen collection/handling, submission of specimen other than nasopharyngeal swab, presence of viral mutation(s) within the areas targeted by this assay, and inadequate number of viral copies(<138 copies/mL). A negative result must be combined with clinical observations, patient history, and epidemiological information. The expected result is Negative.  Fact Sheet for Patients:  EntrepreneurPulse.com.au  Fact Sheet for Healthcare Providers:  IncredibleEmployment.be  This test is no t yet approved or cleared by the [place] FDA and  has been authorized for detection and/or diagnosis of SARS-CoV-2 by FDA under an Emergency Use Authorization (EUA). This EUA will remain  in effect (meaning this test can be used) for the duration of the COVID-19 declaration under Section 564(b)(1) of the Act, 21 U.S.C.section 360bbb-3(b)(1), unless the authorization is terminated  or revoked sooner.       Influenza A by PCR NEGATIVE NEGATIVE Final   Influenza B by PCR NEGATIVE NEGATIVE Final    Comment:  (NOTE) The Xpert Xpress SARS-CoV-2/FLU/RSV plus assay is intended as an aid in the diagnosis of influenza from Nasopharyngeal swab specimens and should not be used as a sole basis for treatment. Nasal washings and aspirates are unacceptable for Xpert Xpress SARS-CoV-2/FLU/RSV testing.  Fact Sheet for Patients: EntrepreneurPulse.com.au  Fact Sheet for Healthcare Providers: IncredibleEmployment.be  This test is not yet approved or cleared by the [place] FDA and has been authorized for detection and/or diagnosis of SARS-CoV-2 by FDA under an Emergency Use Authorization (EUA). This EUA will remain in effect (meaning this test can be used) for the duration of the COVID-19 declaration under Section 564(b)(1) of the Act, 21 U.S.C. section 360bbb-3(b)(1), unless the authorization is terminated or revoked.  Performed at [facility], [place] [address], [place], [place] [address]   Culture, blood (Routine x 2)     Status: None (Preliminary result)   Collection Time: 12/04/20  4:12 AM   Specimen: BLOOD RIGHT HAND  Result Value Ref Range Status   Specimen Description BLOOD RIGHT HAND  Final   Special Requests   Final    BOTTLES DRAWN AEROBIC AND ANAEROBIC Blood Culture results may not be optimal due to an inadequate volume of blood received in culture bottles   Culture   Final    NO GROWTH 3 DAYS Performed at [facility], [place] [address] [place], [place] [address]    Report Status PENDING  Incomplete    RADIOLOGY STUDIES/RESULTS: No results found.   LOS: 4 days   Oren Binet, MD  [facility]  To contact the attending provider between 7A-7P or the covering provider during after hours 7P-7A, please log into the web site [email or web address] and access using universal [place] password for that web site. If you do not have the password, please call the hospital operator.  12/07/2020, 2:58 PM

## 2020-12-07 NOTE — Progress Notes (Signed)
Pharmacy Antibiotic Note  Kyle Walton is a 65 y.o. male admitted on 12/03/2020 with cellulitis.  Pharmacy has been consulted for vancomycin. Patient presents with PMH significant for DM, CAD, ESRD on HD MWF with chief complain of increased leg pain and weakness.  -WBC= 16, afeb -cultures- ngtd  Plan: -vancomycin 1000 MG QHD MWF  -Will check a pre-HD vancomycin level on 6/1   Height: '5\' 10"'$  (177.8 cm) Weight: 124.1 kg (273 lb 9.5 oz) IBW/kg (Calculated) : 73  Temp (24hrs), Avg:97.9 F (36.6 C), Min:97.4 F (36.3 C), Max:98.5 F (36.9 C)  Recent Labs  Lab 12/03/20 1311 12/03/20 1444 12/04/20 0412 12/05/20 0721 12/06/20 0042 12/07/20 0056  WBC 18.3*  --  16.2* 17.3* 15.3* 16.0*  CREATININE 9.94*  --  6.10* 7.54* 8.64* 5.56*  LATICACIDVEN 1.8 1.9  --   --   --   --     Estimated Creatinine Clearance: 17.5 mL/min (A) (by C-G formula based on SCr of 5.56 mg/dL (H)).    Allergies  Allergen Reactions  . Morphine Other (See Comments)    Per son, Kyle Walton, pt becomes unresponsive/disoriented. Especially when given after HD tx  . Liraglutide Diarrhea    Phenol Phenol      Thank you for allowing pharmacy to be a part of this patient's care.  Hildred Laser, PharmD Clinical Pharmacist **Pharmacist phone directory can now be found on [email or web address] ([email or web address]).  Listed under [place].

## 2020-12-07 NOTE — Progress Notes (Signed)
Mobility Specialist: Progress Note   12/07/20 1220  Mobility  Activity Transferred:  Chair to bed  Level of Assistance +2 (takes two people)  Assistive Device Stedy  Mobility Out of bed to chair with meals  Mobility Response Tolerated poorly  Mobility performed by Mobility specialist  $Mobility charge 1 Mobility   Post-Mobility: 122 HR  Pt was maxA +2 for transfer. Pt was not able to assist very much with standing this trial, said he feels like he is losing his strength. Pt took two attempts to stand with bilateral knee buckling again. Pt back to bed with call bell at his side, RN present in room.   [facility] Cozetta Seif Mobility Specialist Mobility Specialist Phone: [phone]

## 2020-12-07 NOTE — Progress Notes (Signed)
Mobility Specialist: Progress Note   12/07/20 1131  Mobility  Activity Transferred:  Bed to chair  Level of Assistance +2 (takes two people)  Assistive Device Stedy  Mobility Out of bed to chair with meals  Mobility Response Tolerated poorly  Mobility performed by Mobility specialist  Bed Position Chair  $Mobility charge 1 Mobility   Post-Mobility: 121 HR  Attempted to assist pt with standing at RW but pt unable with +2 assistance. Pt's knees kept buckling when attempting to stand. Pt was assisted in standing by NT and myself at the stedy instead to transfer to the recliner. Pt was able to assist more by pulling on the stedy. Pt is in the chair with NT present in room. Will f/u later to assist pt back to bed.   [facility] Freemon Binford Mobility Specialist Mobility Specialist Phone: [phone]

## 2020-12-07 NOTE — Progress Notes (Signed)
[facility] Progress Note   Subjective:   Pt seen in room, up in the chair. C/o pain all over and R leg / hip  Objective Vitals:   12/07/20 0730 12/07/20 1100 12/07/20 1225 12/07/20 1226  BP: (!) 89/61 105/73 100/61 100/61  Pulse: 88 100 69 69  Resp: '18 20 20 20  '$ Temp: 98 F (36.7 C) 98.4 F (36.9 C)    TempSrc: Axillary Oral Oral   SpO2: 98% 92% 96% 96%  Weight:      Height:       Physical Exam General: Well developed male, alert and in NAD Heart: Tachycardic, regular rhythm, no murmur auscultated Lungs: CTA bilaterally without wheezing, rhonchi or rales Abdomen: Soft, non-tender, non-distended, +BS Extremities: 1+ pretibial edema, R lower leg scattered shallow wounds lower leg, no rash or lesions on the R upper leg; bilat toe amps Dialysis Access:  L IJ [facility]   Additional Objective Labs: Basic Metabolic Panel: Recent Labs  Lab 12/05/20 0721 12/06/20 0042 12/06/20 0700 12/07/20 0056  NA 136 134*  --  132*  K 4.6 4.7  --  4.0  CL 100 98  --  95*  CO2 20* 22  --  24  GLUCOSE 119* 196*  --  127*  BUN 43* 54*  --  30*  CREATININE 7.54* 8.64*  --  5.56*  CALCIUM 8.4* 8.3*  --  8.1*  PHOS 6.9*  --  7.9* 5.1*   Liver Function Tests: Recent Labs  Lab 12/03/20 1311 12/05/20 0721 12/06/20 0042 12/07/20 0056  AST 38  --  44*  --   ALT 12  --  15  --   ALKPHOS 77  --  66  --   BILITOT 1.3*  --  1.0  --   PROT 6.8  --  5.7*  --   ALBUMIN 2.9* 2.4* 2.3* 2.5*   No results for input(s): LIPASE, AMYLASE in the last 168 hours. CBC: Recent Labs  Lab 12/03/20 1311 12/04/20 0412 12/05/20 0721 12/06/20 0042 12/07/20 0056  WBC 18.3* 16.2* 17.3* 15.3* 16.0*  NEUTROABS 15.8*  --   --   --   --   HGB 10.9* 10.2* 9.8* 9.4* 10.2*  HCT 33.9* 32.0* 30.9* 29.9* 31.6*  MCV 95.0 94.1 93.9 94.0 92.7  PLT 148* 145* 133* 149* 156   Blood Culture    Component Value Date/Time   SDES BLOOD RIGHT HAND 12/04/2020 0412   SPECREQUEST  12/04/2020 0412    BOTTLES  DRAWN AEROBIC AND ANAEROBIC Blood Culture results may not be optimal due to an inadequate volume of blood received in culture bottles   CULT  12/04/2020 0412    NO GROWTH 3 DAYS Performed at [facility], [place] [address], [place], [place] [address]    REPTSTATUS PENDING 12/04/2020 [id number]    Cardiac Enzymes: No results for input(s): CKTOTAL, CKMB, CKMBINDEX, TROPONINI in the last 168 hours. CBG: Recent Labs  Lab 12/06/20 1247 12/06/20 1612 12/06/20 2108 12/07/20 0610 12/07/20 1158  GLUCAP 144* 162* 127* 118* 90   Iron Studies: No results for input(s): IRON, TIBC, TRANSFERRIN, FERRITIN in the last 72 hours. '@lablastinr3'$ @ Studies/Results: DG CHEST PORT 1 VIEW  Result Date: 12/05/2020 CLINICAL DATA:  Shortness of breath EXAM: PORTABLE CHEST 1 VIEW COMPARISON:  Dec 03, 2020 FINDINGS: The cardiomediastinal silhouette is unchanged and enlarged in contour.RIGHT chest AICD. Status post median sternotomy and CABG. LEFT IJ CVC tip terminates over the RIGHT atrium. No pleural effusion. No pneumothorax.  Scattered linear opacities consistent with atelectasis. Perihilar vascular fullness without overt edema. Visualized abdomen is unremarkable. Multilevel degenerative changes of the thoracic spine. IMPRESSION: Cardiomegaly with pulmonary vascular congestion without overt edema. Electronically Signed   By: Valentino Saxon MD   On: 12/05/2020 14:42   Medications: . vancomycin     . acetaminophen  1,000 mg Oral Q8H  . amiodarone  200 mg Oral Daily  . apixaban  2.5 mg Oral BID  . atorvastatin  40 mg Oral Daily  . calcium acetate  1,334 mg Oral TID WC  . Chlorhexidine Gluconate Cloth  6 each Topical Q0600  . cholestyramine light  4 g Oral QID  . clopidogrel  75 mg Oral Daily  . doxercalciferol  2 mcg Intravenous Q M,W,F-HD  . insulin aspart  0-15 Units Subcutaneous TID WC  . insulin aspart  3 Units Subcutaneous TID WC  . insulin glargine  40 Units Subcutaneous QHS  . levothyroxine  12.5  mcg Oral QAC breakfast  . lidocaine   Topical TID  . metoprolol tartrate  25 mg Oral BID  . midodrine  10 mg Oral TID WC  . pantoprazole  40 mg Oral Daily  . pregabalin  25 mg Oral Daily  . sodium bicarbonate  325 mg Oral BID    [facility] 4h 450/800 117kg 2K/3Ca bath TDC Hep 4900 + 500u/hr - zemplar 3.5 ug tiw - aranesp 85 mg q wed  Assessment/Plan: 1. RLE Cellulitis - on abx per primary team (vancomycin). Bld cxs from 5/27 and 5/28 both NTD.  2. R hip / upper leg pain - no lesions on exam or by imaging (CT). Has some edema but doesn't explain pain. Follow.  3. ESRD - MWF HD w/ [facility] [place]. HD tomorrow.  2. HTN/ vol - BP's are soft, have dc'd low dose norvasc.On metoprolol for afib. Will set higher goal tomorrow w/ LE edema , up 6-7kg by wts. Midodrine 10 tid added here.  3. MBD ckd - cont phoslo, vdra. Phos 5.1- 7.9 here.  4. Pruritis- chronic, atarax on med list 5. Anemia ckd - last Hb9.4.Cont darbe 75 weekly. 6. Atrial fib - per pmd, on home eliquis 2.5 bid and home amio/ metoprolol 7. CAD hx CABG/ PCI - on home imdur, plavix, metoprolol  Kelly Splinter, MD 12/07/2020, 1:50 PM

## 2020-12-07 NOTE — Progress Notes (Deleted)
Patient stated he feels like he has a "floater" in his left eye and is seeing shooting stars out of his right eye. NIH stroke scale performed, ruled out. Blood sugar checked, 157.  PA paged. Awaiting response.  Daymon Larsen, RN

## 2020-12-07 NOTE — Progress Notes (Signed)
Physical Therapy Treatment Patient Details Name: Kyle Walton MRN: [id number] DOB: 03-22-1956 Today's Date: 12/07/2020    History of Present Illness Pt is a 65 y.o. male who presented 5/27 with R leg cellulitis and sepsis. PMH: PAF, OSA, MI, ESRD on HD MWF, DM on insulin, s/p multiple toe amputations, morbid obesity, and CAD.    PT Comments    Pt received in bed eating breakfast. At rest with HOB at 45 degrees, BP 89/61 and HR 120. Pt required +2 total assist bed mobility. BLE exercises performed in supine. After exercise/mobility, BP 95/66. Pt reports episode of vomiting with attempt to sit up with nursing overnight/this morning. No N&V during today's session. Pt remained in bed at end of session.    Follow Up Recommendations  SNF;Supervision/Assistance - 24 hour     Equipment Recommendations  3in1 (PT);Wheelchair (measurements PT);Wheelchair cushion (measurements PT);Hospital bed;Other (comment) (hoyer lift, unsure what he already owns)    Recommendations for Other Services       Precautions / Restrictions Precautions Precautions: Fall;Other (comment) Precaution Comments: contact precautions Restrictions Other Position/Activity Restrictions: h/o multiple toe amputations bilat    Mobility  Bed Mobility Overal bed mobility: Needs Assistance Bed Mobility: Supine to Sit;Sit to Supine     Supine to sit: HOB elevated;Total assist;+2 for physical assistance;+2 for safety/equipment Sit to supine: Total assist;+2 for physical assistance;+2 for safety/equipment   General bed mobility comments: cues for sequencing, assist with all aspects of mobility    Transfers                 General transfer comment: Mobility tech and NT transfered pt to recliner using stedy following PT session.  Ambulation/Gait                 Stairs             Wheelchair Mobility    Modified Rankin (Stroke Patients Only)       Balance                                             Cognition Arousal/Alertness: Awake/alert Behavior During Therapy: WFL for tasks assessed/performed Overall Cognitive Status: No family/caregiver present to determine baseline cognitive functioning                                 General Comments: following simple commands, difficulty staying on task      Exercises General Exercises - Lower Extremity Ankle Circles/Pumps: AROM;Both Heel Slides: AAROM;Right;Left;5 reps Hip ABduction/ADduction: AAROM;Right;Left;5 reps Straight Leg Raises: AAROM;Right;Left;5 reps Other Exercises Other Exercises: pulling to long sit using bilat lower bedrails x 3 reps, mod assist    General Comments General comments (skin integrity, edema, etc.): Pt had removed his O2. SpO2 90-92% on RA. BP with HOB at 45 degrees, 89/61. After exercise and bed mobility, BP 95/66. Resting HR 120, remained steady during activity.      Pertinent Vitals/Pain Pain Assessment: Faces Faces Pain Scale: Hurts even more Pain Location: R leg with movement Pain Descriptors / Indicators: Grimacing;Guarding;Moaning Pain Intervention(s): Limited activity within patient's tolerance;Monitored during session;Repositioned    Home Living                      Prior Function  PT Goals (current goals can now be found in the care plan section) Acute Rehab PT Goals Patient Stated Goal: home Progress towards PT goals: Progressing toward goals    Frequency    Min 2X/week      PT Plan Current plan remains appropriate    Co-evaluation              AM-PAC PT "6 Clicks" Mobility   Outcome Measure  Help needed turning from your back to your side while in a flat bed without using bedrails?: A Lot Help needed moving from lying on your back to sitting on the side of a flat bed without using bedrails?: Total Help needed moving to and from a bed to a chair (including a wheelchair)?: Total Help needed standing up from a  chair using your arms (e.g., wheelchair or bedside chair)?: Total Help needed to walk in hospital room?: Total Help needed climbing 3-5 steps with a railing? : Total 6 Click Score: 7    End of Session   Activity Tolerance: Patient limited by pain Patient left: in bed;with call bell/phone within reach;with bed alarm set Nurse Communication: Mobility status PT Visit Diagnosis: Muscle weakness (generalized) (M62.81);Difficulty in walking, not elsewhere classified (R26.2);Pain Pain - Right/Left: Right Pain - part of body: Leg     Time: 1030-1055 PT Time Calculation (min) (ACUTE ONLY): 25 min  Charges:  $Therapeutic Exercise: 8-22 mins $Therapeutic Activity: 8-22 mins                     Lorrin Goodell, PT  Office # [phone] Pager [phone]    Lorriane Shire 12/07/2020, 12:13 PM

## 2020-12-08 ENCOUNTER — Inpatient Hospital Stay (HOSPITAL_COMMUNITY): Payer: Medicare HMO

## 2020-12-08 DIAGNOSIS — N185 Chronic kidney disease, stage 5: Secondary | ICD-10-CM

## 2020-12-08 DIAGNOSIS — A419 Sepsis, unspecified organism: Secondary | ICD-10-CM | POA: Diagnosis not present

## 2020-12-08 DIAGNOSIS — R29898 Other symptoms and signs involving the musculoskeletal system: Secondary | ICD-10-CM

## 2020-12-08 DIAGNOSIS — L03115 Cellulitis of right lower limb: Secondary | ICD-10-CM | POA: Diagnosis not present

## 2020-12-08 DIAGNOSIS — E1122 Type 2 diabetes mellitus with diabetic chronic kidney disease: Secondary | ICD-10-CM

## 2020-12-08 DIAGNOSIS — N186 End stage renal disease: Secondary | ICD-10-CM | POA: Diagnosis not present

## 2020-12-08 DIAGNOSIS — I120 Hypertensive chronic kidney disease with stage 5 chronic kidney disease or end stage renal disease: Secondary | ICD-10-CM

## 2020-12-08 LAB — RENAL FUNCTION PANEL
Albumin: 2.4 g/dL — ABNORMAL LOW (ref 3.5–5.0)
Anion gap: 11 (ref 5–15)
BUN: 42 mg/dL — ABNORMAL HIGH (ref 8–23)
CO2: 24 mmol/L (ref 22–32)
Calcium: 8.5 mg/dL — ABNORMAL LOW (ref 8.9–10.3)
Chloride: 97 mmol/L — ABNORMAL LOW (ref 98–111)
Creatinine, Ser: 6.65 mg/dL — ABNORMAL HIGH (ref 0.61–1.24)
GFR, Estimated: 9 mL/min — ABNORMAL LOW (ref >60)
Glucose, Bld: 114 mg/dL — ABNORMAL HIGH (ref 70–99)
Phosphorus: 5.9 mg/dL — ABNORMAL HIGH (ref 2.5–4.6)
Potassium: 4 mmol/L (ref 3.5–5.1)
Sodium: 132 mmol/L — ABNORMAL LOW (ref 135–145)

## 2020-12-08 LAB — CBC
HCT: 32.6 % — ABNORMAL LOW (ref 39.0–52.0)
Hemoglobin: 10.1 g/dL — ABNORMAL LOW (ref 13.0–17.0)
MCH: 29.3 pg (ref 26.0–34.0)
MCHC: 31 g/dL (ref 30.0–36.0)
MCV: 94.5 fL (ref 80.0–100.0)
Platelets: 197 10*3/uL (ref 150–400)
RBC: 3.45 MIL/uL — ABNORMAL LOW (ref 4.22–5.81)
RDW: 16.8 % — ABNORMAL HIGH (ref 11.5–15.5)
WBC: 15.8 10*3/uL — ABNORMAL HIGH (ref 4.0–10.5)
nRBC: 0 % (ref 0.0–0.2)

## 2020-12-08 LAB — GLUCOSE, CAPILLARY
Glucose-Capillary: 74 mg/dL (ref 70–99)
Glucose-Capillary: 116 mg/dL — ABNORMAL HIGH (ref 70–99)
Glucose-Capillary: 116 mg/dL — ABNORMAL HIGH (ref 70–99)
Glucose-Capillary: 122 mg/dL — ABNORMAL HIGH (ref 70–99)

## 2020-12-08 LAB — CULTURE, BLOOD (ROUTINE X 2)
Culture: NO GROWTH
Special Requests: ADEQUATE

## 2020-12-08 LAB — CK: Total CK: 24 U/L — ABNORMAL LOW (ref 49–397)

## 2020-12-08 IMAGING — MR MR CERVICAL SPINE W/O CM
5 series · 34 of 48 positions shown · non-contrast
Comparison: None.

CLINICAL DATA: Worsening bilateral lower extremity weakness

EXAM:
MRI CERVICAL, THORACIC AND LUMBAR SPINE WITHOUT CONTRAST
TECHNIQUE: Multiplanar and multiecho pulse sequences of the cervical spine, to
include the craniocervical junction and cervicothoracic junction,
and thoracic and lumbar spine, were obtained without intravenous
contrast.

[Series 5: T2 · sagittal · 3.0mm · 0.69mm/px · 6 of 15 slices shown (1 of 2)]
[im 1/15]
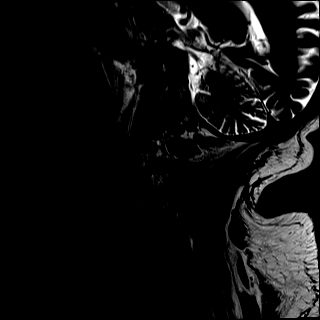
[im 3/15]
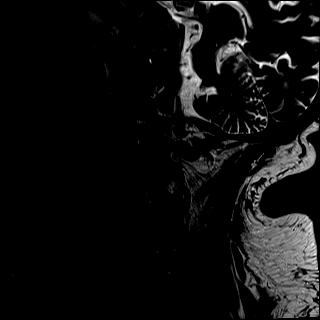
[im 6/15]
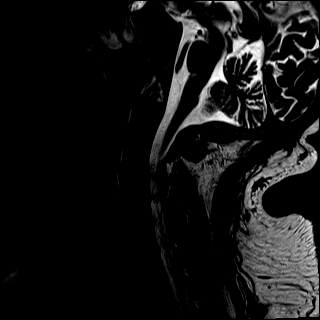
[im 9/15]
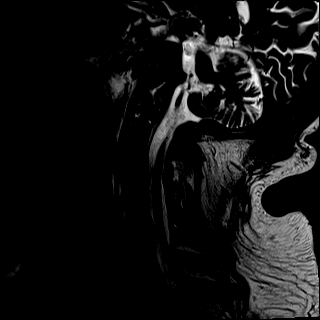
[im 12/15]
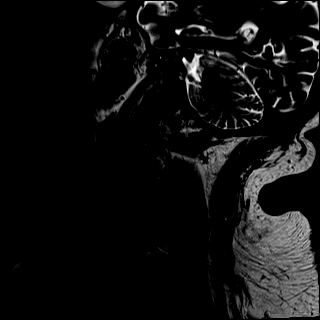
[im 15/15]
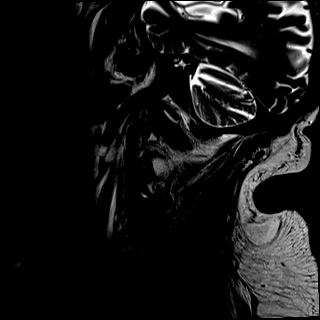

[Series 6: T1 · sagittal · 3.0mm · 0.69mm/px · 6 of 15 slices shown]
[im 1/15]
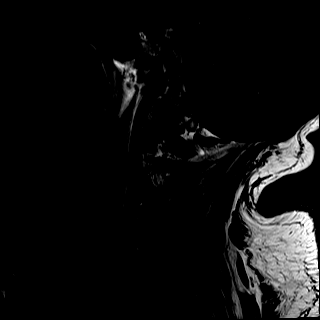
[im 3/15]
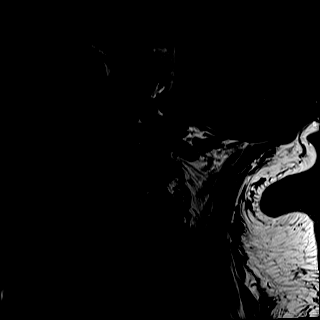
[im 6/15]
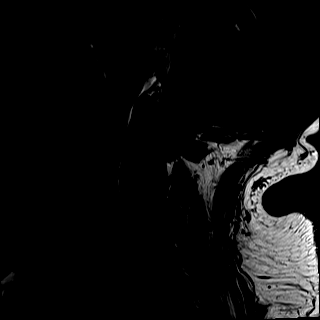
[im 9/15]
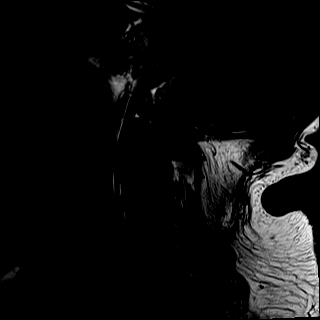
[im 12/15]
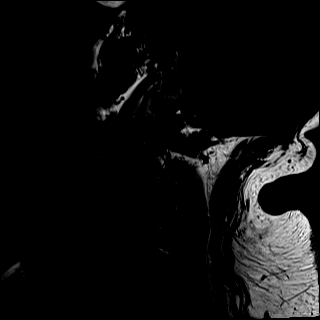
[im 15/15]
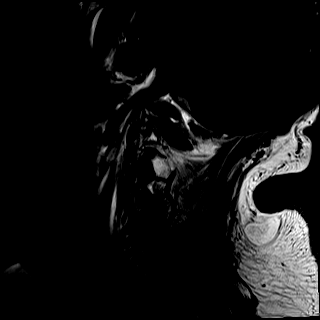

[Series 7: STIR · sagittal · 3.0mm · 0.86mm/px · 6 of 15 slices shown]
[im 1/15]
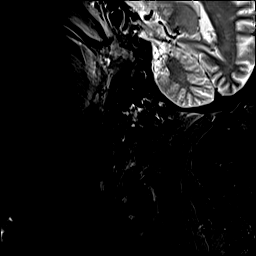
[im 3/15]
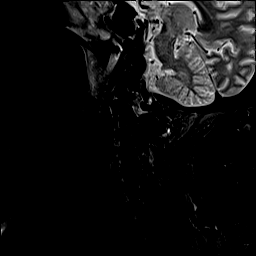
[im 6/15]
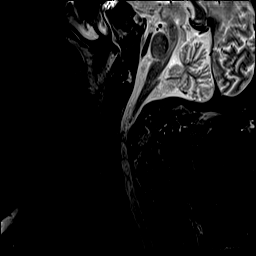
[im 9/15]
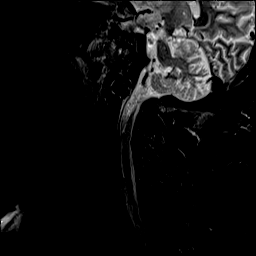
[im 12/15]
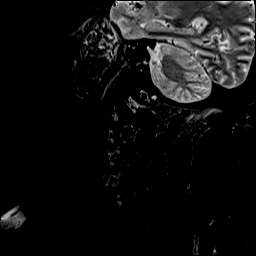
[im 15/15]
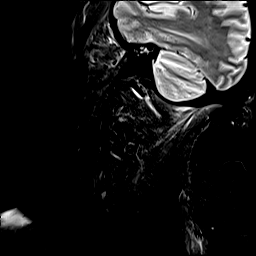

[Series 8: T2 · axial · 3.0mm · 0.66mm/px · z∈[-78,+34]mm · 9 of 40 slices shown (2 of 2)]
[im 1/40]
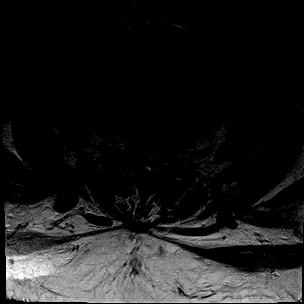
[im 6/40]
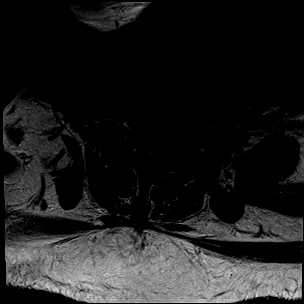
[im 12/40]
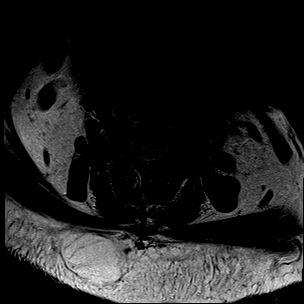
[im 17/40]
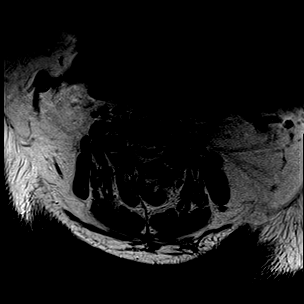
[im 20/40]
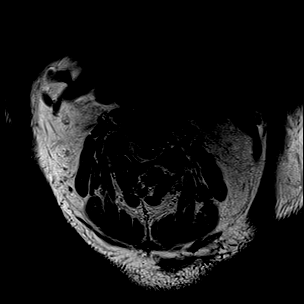
[im 23/40]
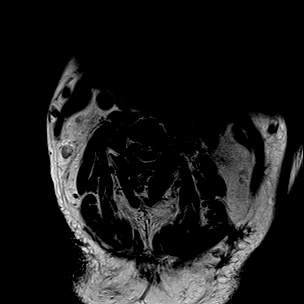
[im 28/40]
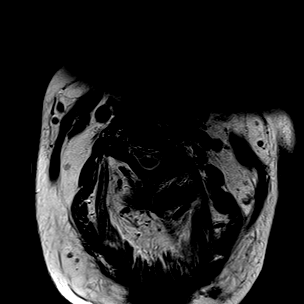
[im 34/40]
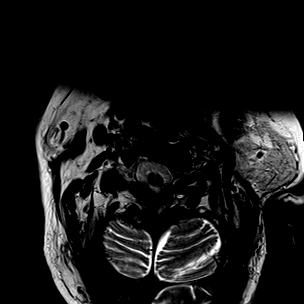
[im 40/40]
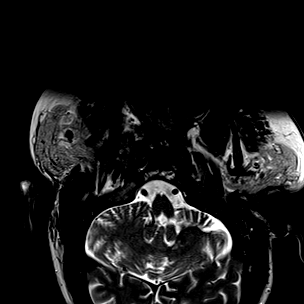

[Series 12: GRE · axial · 3.0mm · 0.39mm/px · z∈[-78,+17]mm · 7 of 40 slices shown]
[im 1/40]
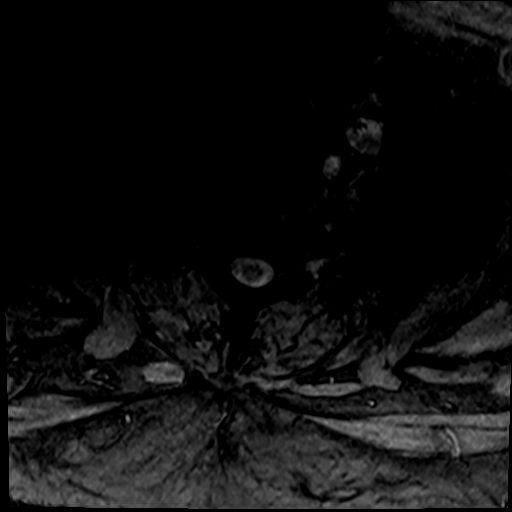
[im 6/40]
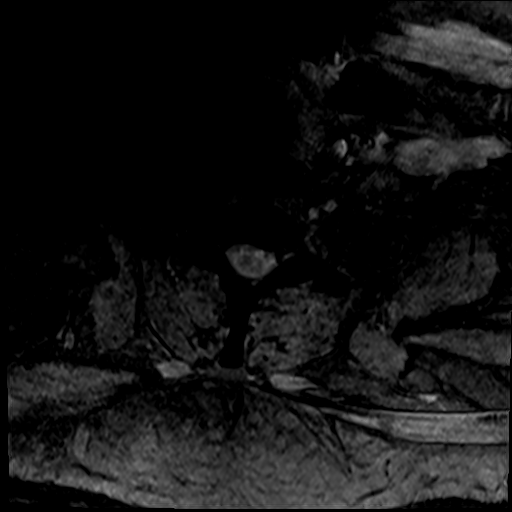
[im 12/40]
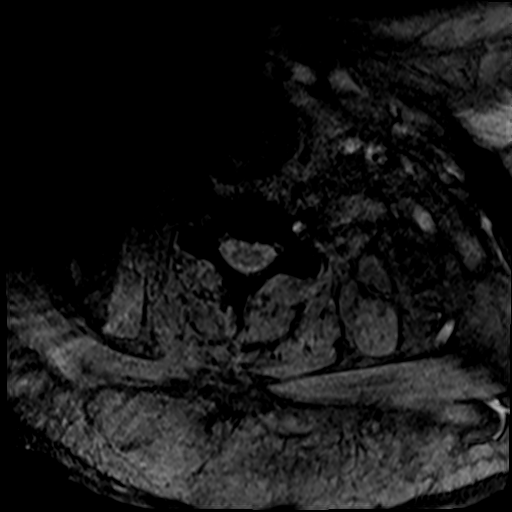
[im 17/40]
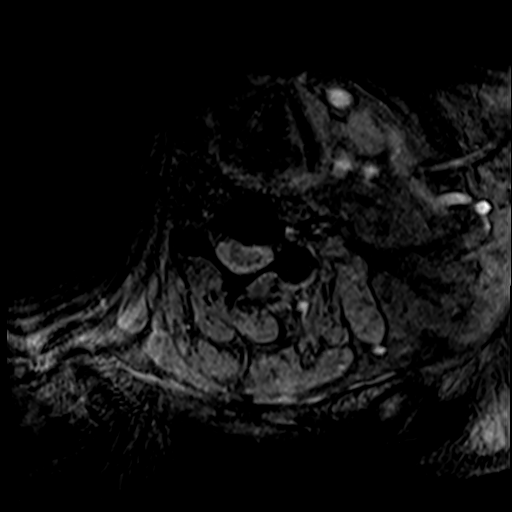
[im 23/40]
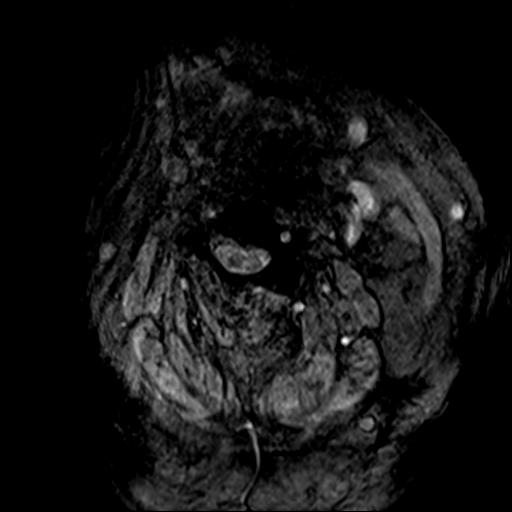
[im 28/40]
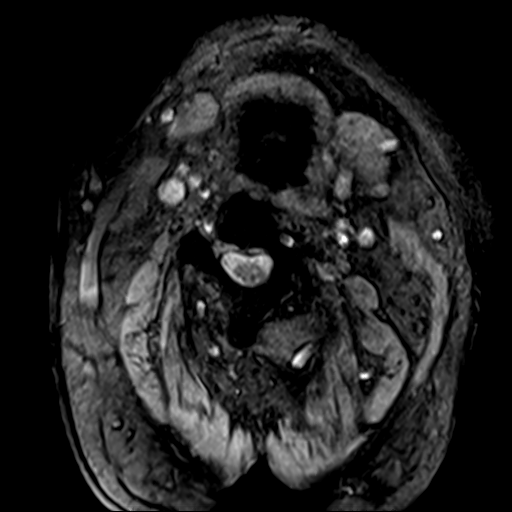
[im 34/40]
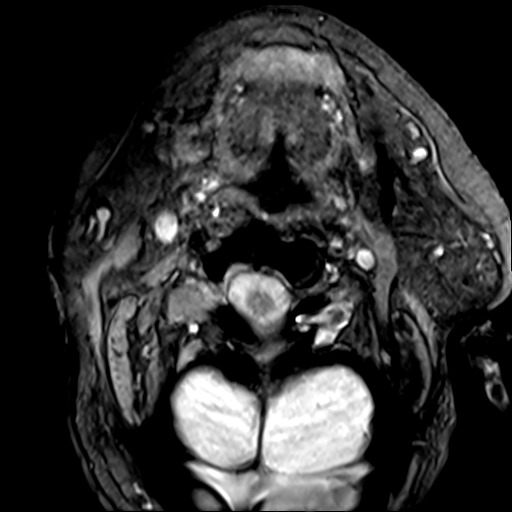

[34 of 48 positions shown; findings below may reference images not displayed]

FINDINGS: MRI CERVICAL SPINE

Alignment: No significant listhesis.

Vertebrae: Vertebral body heights are maintained. No substantial
marrow edema. No suspicious osseous lesion.

Cord: No abnormal signal.

Posterior Fossa, vertebral arteries, paraspinal tissues:
Unremarkable.

Disc levels:

C2-C3: Left facet hypertrophy. No canal or right foraminal stenosis.
Moderate left foraminal stenosis.

C3-C4: Disc bulge with endplate osteophytes. Left facet hypertrophy.
No canal or right foraminal stenosis. Moderate to marked left
foraminal stenosis.

C4-C5: Disc bulge with endplate osteophytes. Right facet joint
effusion. Left facet hypertrophy. No canal stenosis. No foraminal
stenosis.

C5-C6: Disc bulge with superimposed central disc protrusion and
endplate osteophytes. Left facet hypertrophy. No canal or foraminal
stenosis.

C6-C7: Disc bulge with endplate osteophytes. No canal or foraminal
stenosis.

C7-T1: Endplate osteophytes. No canal or right foraminal stenosis.
Minor left foraminal stenosis.

MRI THORACIC SPINE

Motion artifact is present.

Alignment: Preserved.

Vertebrae: Multilevel degenerative endplate irregularity. Chronic
appearing degenerative endplate marrow changes, particularly at
T5-T6 to T7-T8. No substantial marrow edema. No suspicious osseous
lesion.

Cord:  No abnormal signal within above limitation.

Paraspinal and other soft tissues: Unremarkable.

Disc levels: Mild multilevel degenerative disc disease with minor
disc bulges or protrusions. Mild facet arthropathy. No high-grade
degenerative stenosis.

MRI LUMBAR SPINE

Motion artifact is present particularly on axial imaging.

Segmentation: Standard.

Alignment:  Trace retrolisthesis at L3-L4.

Vertebrae: Vertebral body heights are maintained apart from small
Schmorl's nodes at the superior and inferior L3 on plates with minor
associated marrow edema. No suspicious osseous lesion.

Conus medullaris and cauda equina: Conus extends to the T12-L1
level. Conus and cauda equina appear normal.

Paraspinal and other soft tissues: Atrophy of the inferior left
psoas.

Disc levels:

L1-L2:  No canal or foraminal stenosis.

L2-L3:  No canal or foraminal stenosis.

L3-L4: Disc bulge. No canal stenosis. No right foraminal stenosis.
Minor left foraminal stenosis.

L4-L5: Disc bulge with endplate osteophytic ridging eccentric to the
left. Facet arthropathy. No canal or right foraminal stenosis. Mild
to moderate left foraminal stenosis.

L5-S1: Small right subarticular disc protrusion. Facet arthropathy.
No canal stenosis. Slight effacement of the right subarticular
recess. No foraminal stenosis.
IMPRESSION: No abnormal cord signal. No high-grade canal stenosis. No evidence
of discitis/osteomyelitis or epidural collection.

Multilevel degenerative changes as detailed above.

## 2020-12-08 IMAGING — MR MR LUMBAR SPINE W/O CM
4 of 5 series · 25 of 48 positions shown · non-contrast
Comparison: None.

CLINICAL DATA: Worsening bilateral lower extremity weakness

EXAM:
MRI CERVICAL, THORACIC AND LUMBAR SPINE WITHOUT CONTRAST
TECHNIQUE: Multiplanar and multiecho pulse sequences of the cervical spine, to
include the craniocervical junction and cervicothoracic junction,
and thoracic and lumbar spine, were obtained without intravenous
contrast.

[Series 9: T2 · sagittal · 4.0mm · 0.73mm/px · 6 of 16 slices shown (1 of 2)]
[im 1/16]
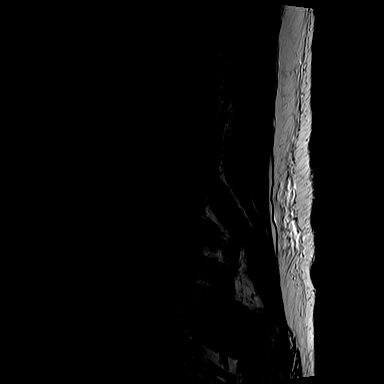
[im 4/16]
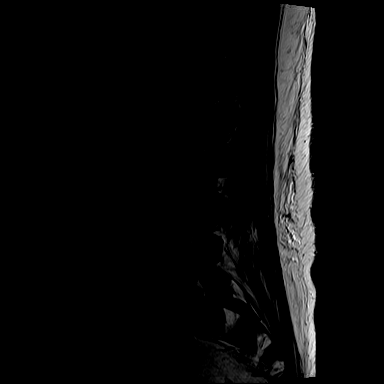
[im 7/16]
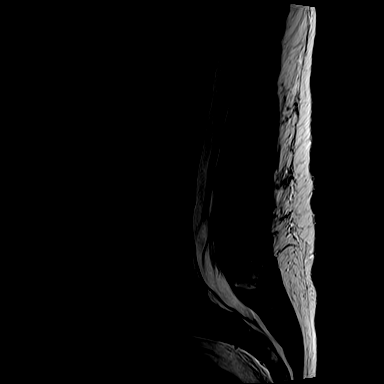
[im 10/16]
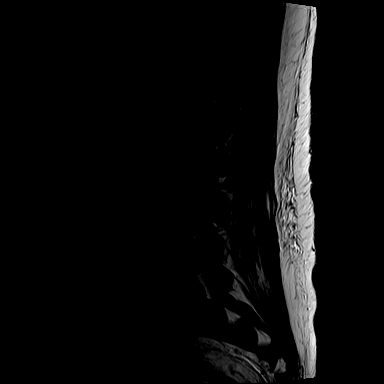
[im 13/16]
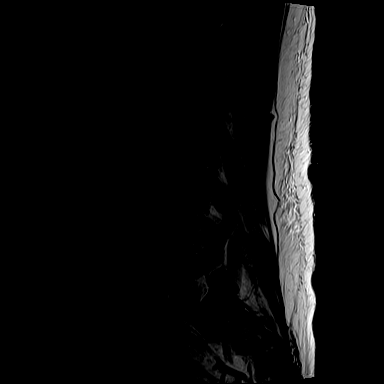
[im 16/16]
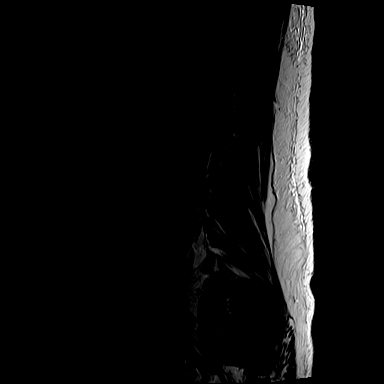

[Series 11: T1 · sagittal · 4.0mm · 0.88mm/px · 7 of 16 slices shown (1 of 2)]
[im 1/16]
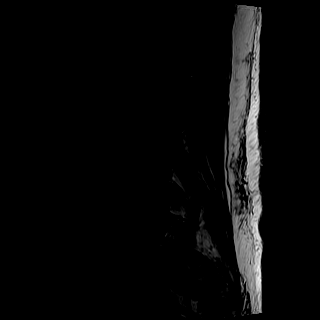
[im 3/16]
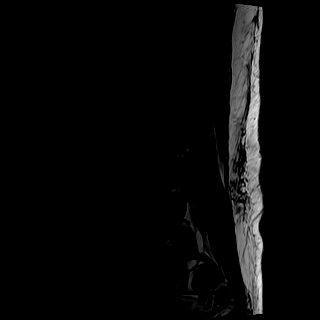
[im 6/16]
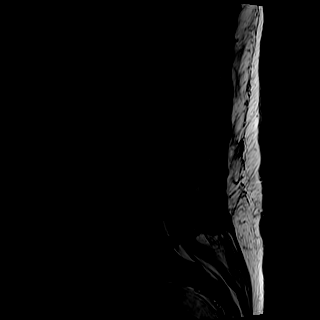
[im 8/16]
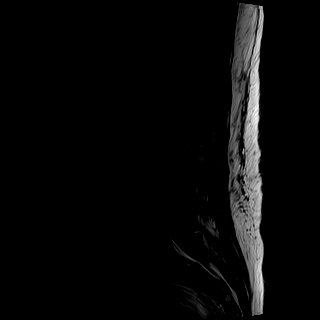
[im 11/16]
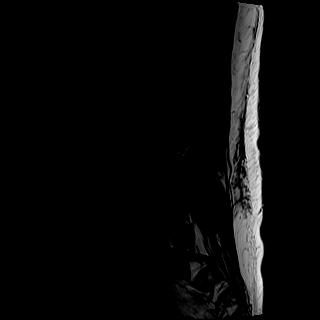
[im 13/16]
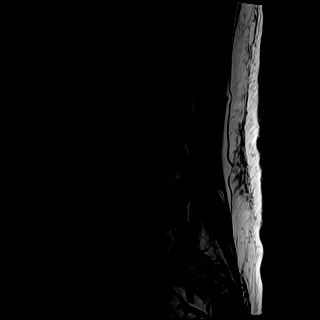
[im 16/16]
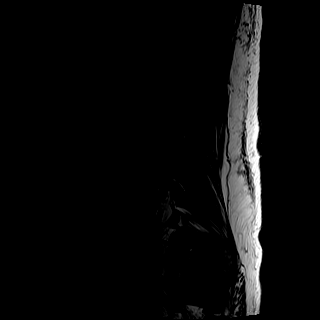

[Series 12: T2 · axial · 4.0mm · 0.57mm/px · z∈[-525,-328]mm · 8 of 32 slices shown (2 of 2)]
[im 1/32]
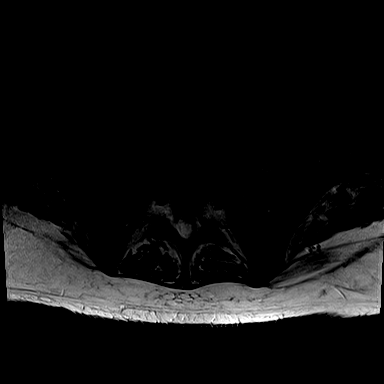
[im 5/32]
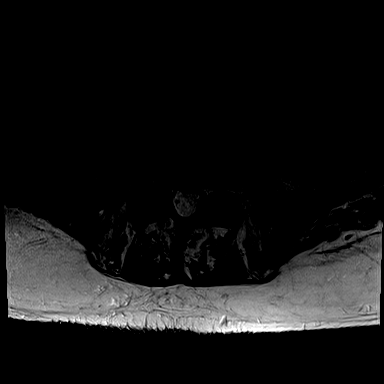
[im 10/32]
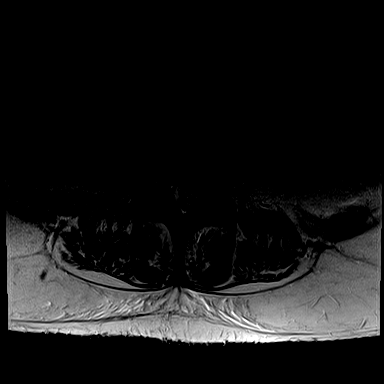
[im 15/32]
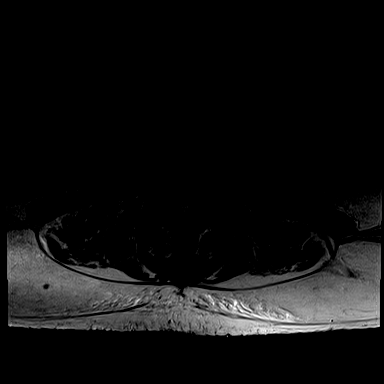
[im 17/32]
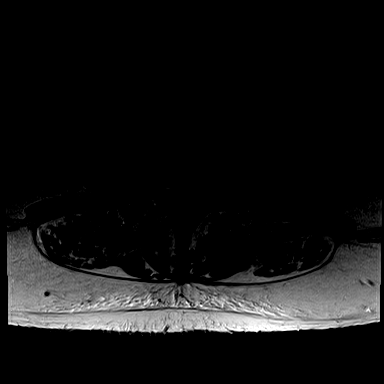
[im 22/32]
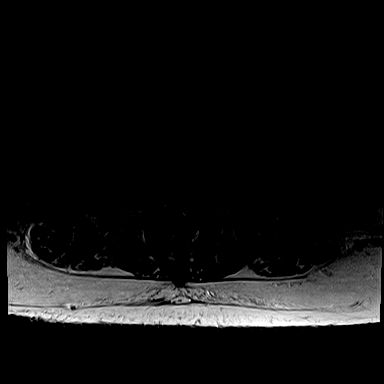
[im 27/32]
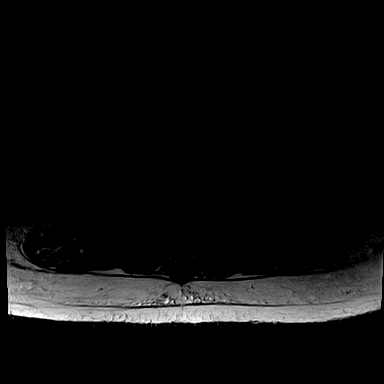
[im 32/32]
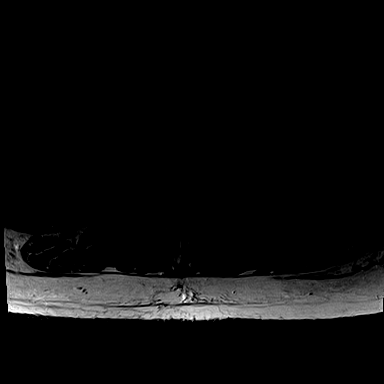

[Series 13: T1 · axial · 4.0mm · 0.34mm/px · z∈[-525,-352]mm · 4 of 32 slices shown (2 of 2)]
[im 1/32]
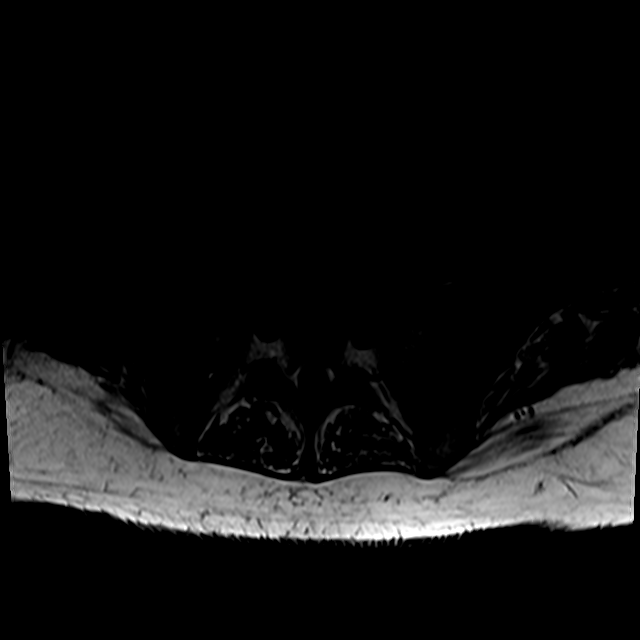
[im 5/32]
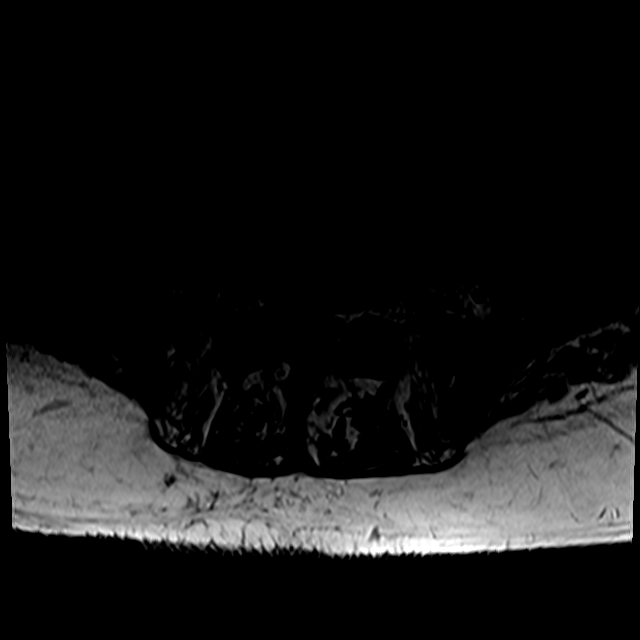
[im 17/32]
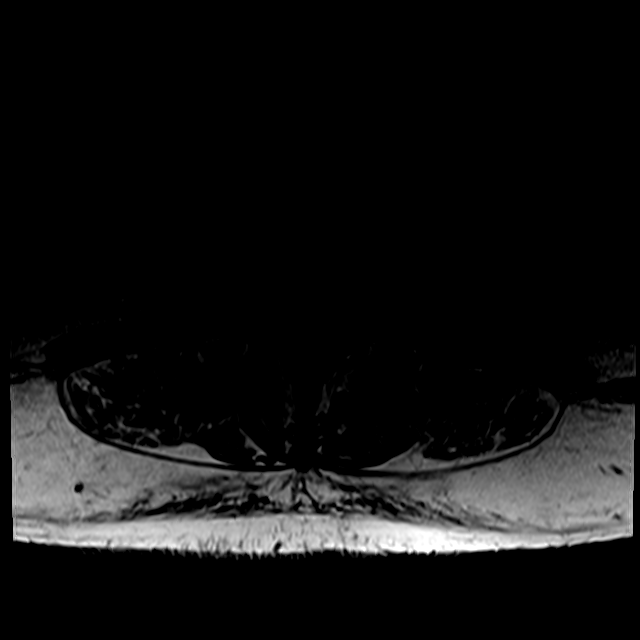
[im 27/32]
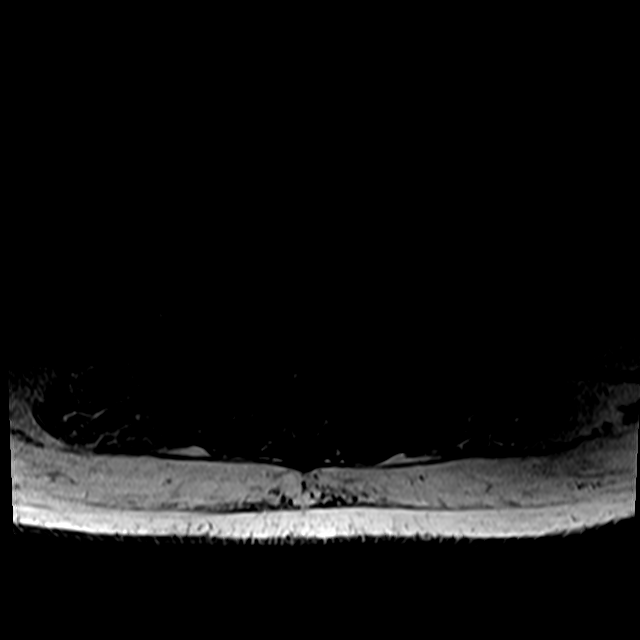

[25 of 48 positions shown; findings below may reference images not displayed]

FINDINGS: MRI CERVICAL SPINE

Alignment: No significant listhesis.

Vertebrae: Vertebral body heights are maintained. No substantial
marrow edema. No suspicious osseous lesion.

Cord: No abnormal signal.

Posterior Fossa, vertebral arteries, paraspinal tissues:
Unremarkable.

Disc levels:

C2-C3: Left facet hypertrophy. No canal or right foraminal stenosis.
Moderate left foraminal stenosis.

C3-C4: Disc bulge with endplate osteophytes. Left facet hypertrophy.
No canal or right foraminal stenosis. Moderate to marked left
foraminal stenosis.

C4-C5: Disc bulge with endplate osteophytes. Right facet joint
effusion. Left facet hypertrophy. No canal stenosis. No foraminal
stenosis.

C5-C6: Disc bulge with superimposed central disc protrusion and
endplate osteophytes. Left facet hypertrophy. No canal or foraminal
stenosis.

C6-C7: Disc bulge with endplate osteophytes. No canal or foraminal
stenosis.

C7-T1: Endplate osteophytes. No canal or right foraminal stenosis.
Minor left foraminal stenosis.

MRI THORACIC SPINE

Motion artifact is present.

Alignment: Preserved.

Vertebrae: Multilevel degenerative endplate irregularity. Chronic
appearing degenerative endplate marrow changes, particularly at
T5-T6 to T7-T8. No substantial marrow edema. No suspicious osseous
lesion.

Cord:  No abnormal signal within above limitation.

Paraspinal and other soft tissues: Unremarkable.

Disc levels: Mild multilevel degenerative disc disease with minor
disc bulges or protrusions. Mild facet arthropathy. No high-grade
degenerative stenosis.

MRI LUMBAR SPINE

Motion artifact is present particularly on axial imaging.

Segmentation: Standard.

Alignment:  Trace retrolisthesis at L3-L4.

Vertebrae: Vertebral body heights are maintained apart from small
Schmorl's nodes at the superior and inferior L3 on plates with minor
associated marrow edema. No suspicious osseous lesion.

Conus medullaris and cauda equina: Conus extends to the T12-L1
level. Conus and cauda equina appear normal.

Paraspinal and other soft tissues: Atrophy of the inferior left
psoas.

Disc levels:

L1-L2:  No canal or foraminal stenosis.

L2-L3:  No canal or foraminal stenosis.

L3-L4: Disc bulge. No canal stenosis. No right foraminal stenosis.
Minor left foraminal stenosis.

L4-L5: Disc bulge with endplate osteophytic ridging eccentric to the
left. Facet arthropathy. No canal or right foraminal stenosis. Mild
to moderate left foraminal stenosis.

L5-S1: Small right subarticular disc protrusion. Facet arthropathy.
No canal stenosis. Slight effacement of the right subarticular
recess. No foraminal stenosis.
IMPRESSION: No abnormal cord signal. No high-grade canal stenosis. No evidence
of discitis/osteomyelitis or epidural collection.

Multilevel degenerative changes as detailed above.

## 2020-12-08 IMAGING — MR MR THORACIC SPINE W/O CM
5 of 7 series · 29 of 48 positions shown · non-contrast
Comparison: None.

CLINICAL DATA: Worsening bilateral lower extremity weakness

EXAM:
MRI CERVICAL, THORACIC AND LUMBAR SPINE WITHOUT CONTRAST
TECHNIQUE: Multiplanar and multiecho pulse sequences of the cervical spine, to
include the craniocervical junction and cervicothoracic junction,
and thoracic and lumbar spine, were obtained without intravenous
contrast.

[Series 17: T1 · axial · 4.0mm · 0.88mm/px · z∈[-95,+170]mm · 9 of 54 slices shown (1 of 3)]
[im 1/54]
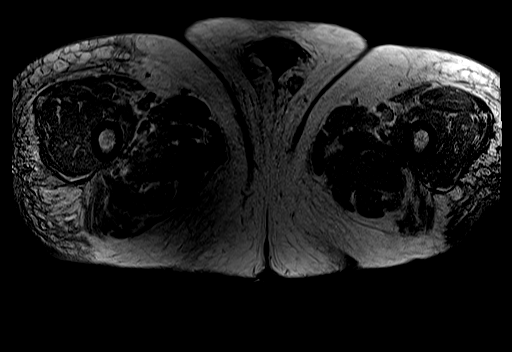
[im 9/54]
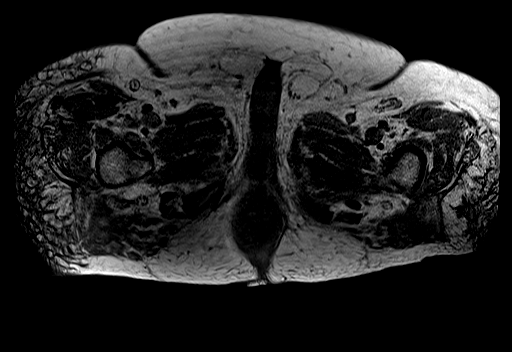
[im 18/54]
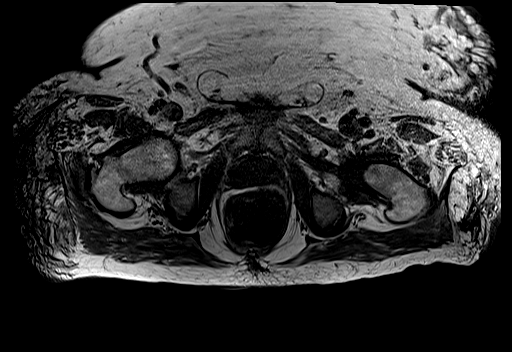
[im 23/54]
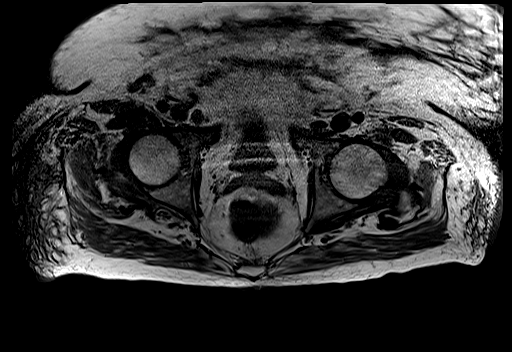
[im 27/54]
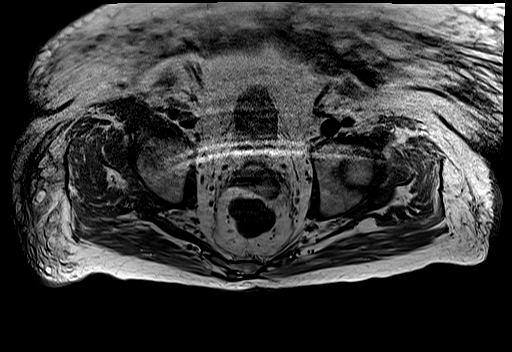
[im 31/54]
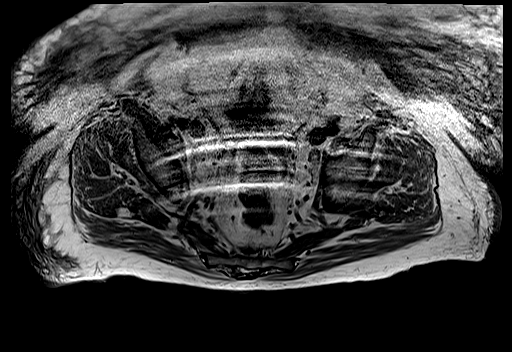
[im 36/54]
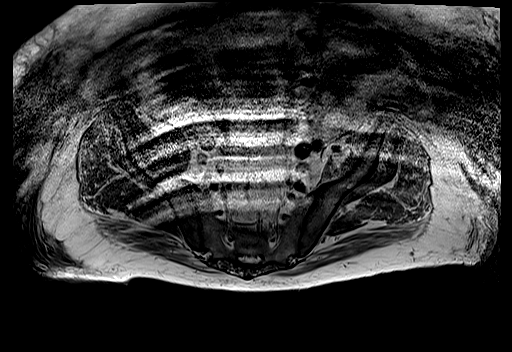
[im 45/54]
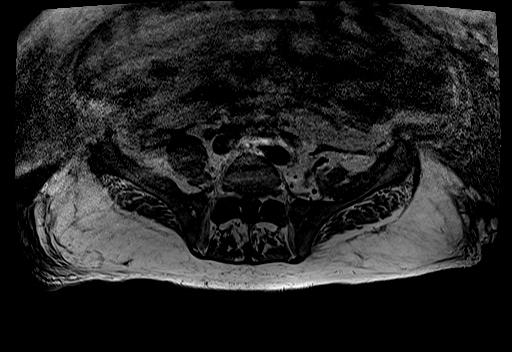
[im 54/54]
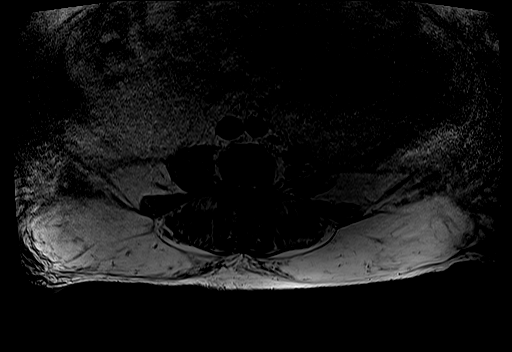

[Series 18: T1 · sagittal · 6.0mm · 1.23mm/px · 2 of 9 slices shown (2 of 3)]
[im 1/9]
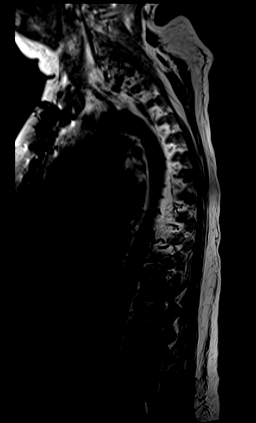
[im 9/9]
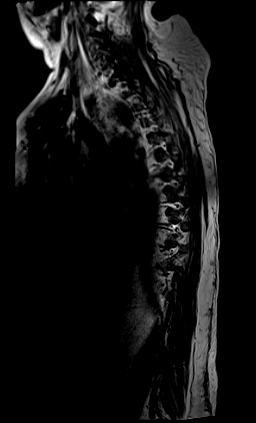

[Series 19: T2 · sagittal · 3.0mm · 0.76mm/px · 5 of 20 slices shown (1 of 2)]
[im 1/20]
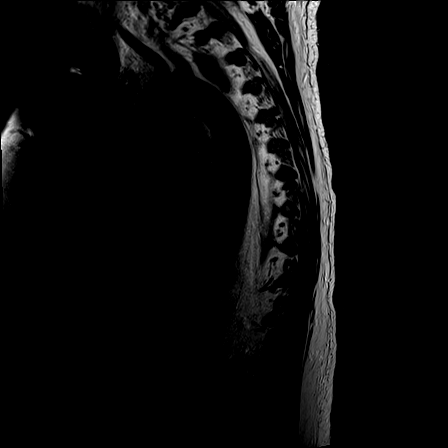
[im 5/20]
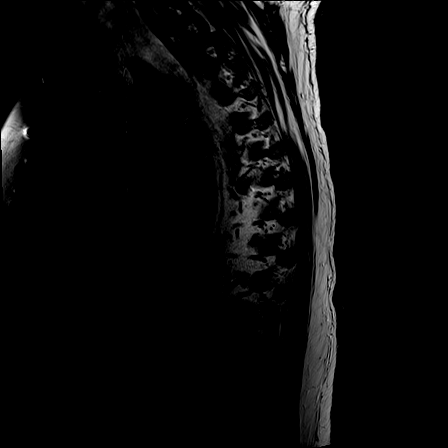
[im 10/20]
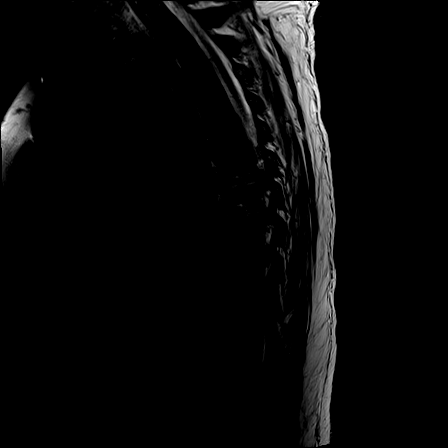
[im 15/20]
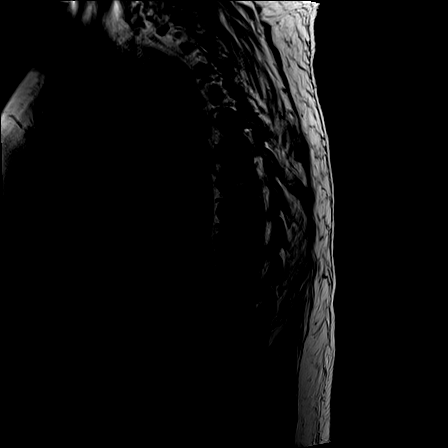
[im 20/20]
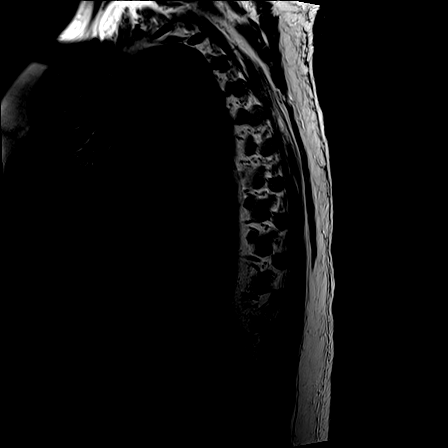

[Series 20: T1 · sagittal · 3.0mm · 0.76mm/px · 4 of 20 slices shown (3 of 3)]
[im 1/20]
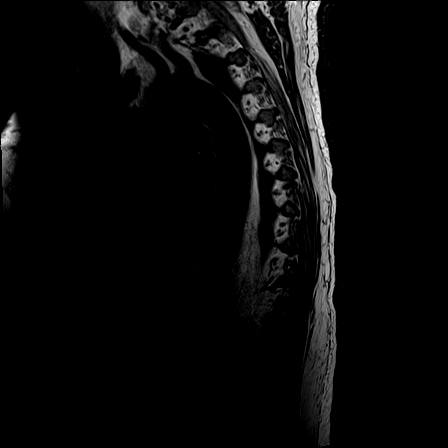
[im 5/20]
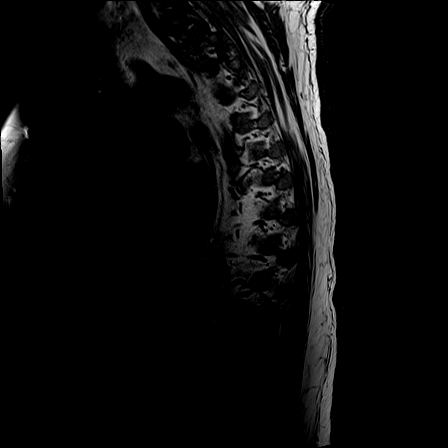
[im 10/20]
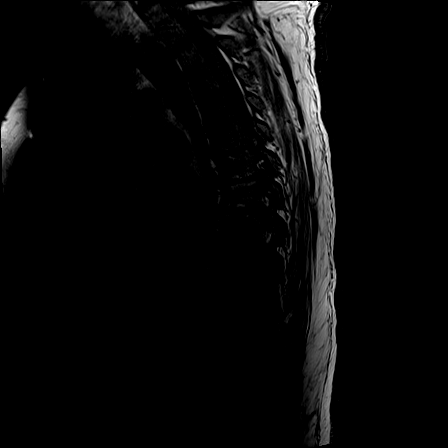
[im 15/20]
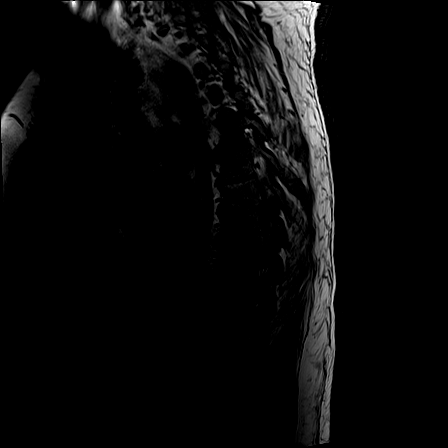

[Series 22: T2 · axial · 4.0mm · 0.59mm/px · z∈[-291,-81]mm · 9 of 36 slices shown (2 of 2)]
[im 1/36]
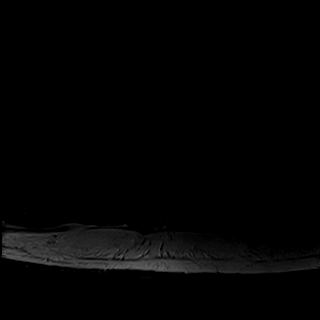
[im 5/36]
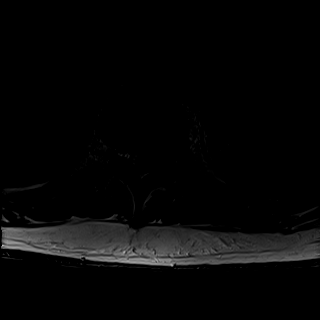
[im 9/36]
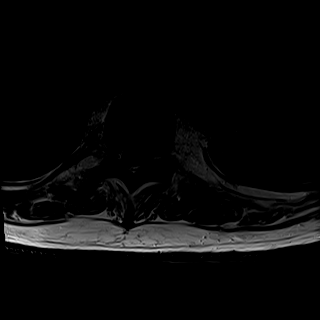
[im 14/36]
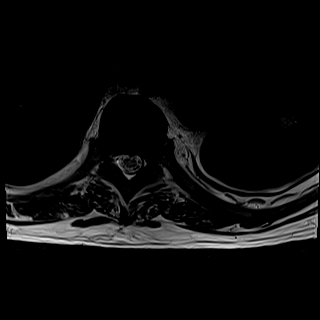
[im 18/36]
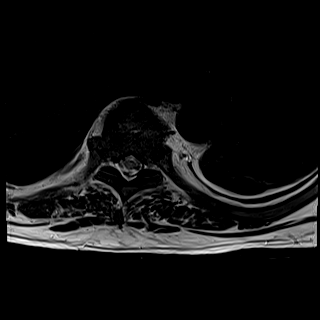
[im 22/36]
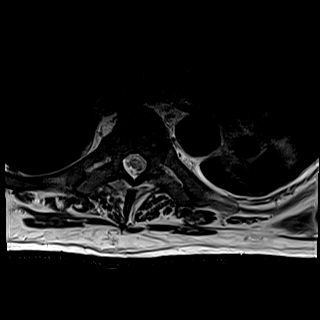
[im 27/36]
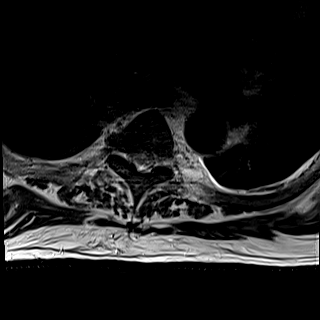
[im 31/36]
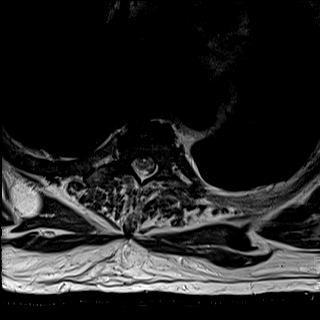
[im 36/36]
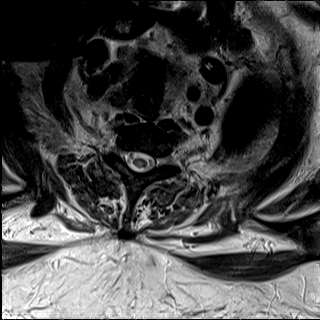

[29 of 48 positions shown; findings below may reference images not displayed]

FINDINGS: MRI CERVICAL SPINE

Alignment: No significant listhesis.

Vertebrae: Vertebral body heights are maintained. No substantial
marrow edema. No suspicious osseous lesion.

Cord: No abnormal signal.

Posterior Fossa, vertebral arteries, paraspinal tissues:
Unremarkable.

Disc levels:

C2-C3: Left facet hypertrophy. No canal or right foraminal stenosis.
Moderate left foraminal stenosis.

C3-C4: Disc bulge with endplate osteophytes. Left facet hypertrophy.
No canal or right foraminal stenosis. Moderate to marked left
foraminal stenosis.

C4-C5: Disc bulge with endplate osteophytes. Right facet joint
effusion. Left facet hypertrophy. No canal stenosis. No foraminal
stenosis.

C5-C6: Disc bulge with superimposed central disc protrusion and
endplate osteophytes. Left facet hypertrophy. No canal or foraminal
stenosis.

C6-C7: Disc bulge with endplate osteophytes. No canal or foraminal
stenosis.

C7-T1: Endplate osteophytes. No canal or right foraminal stenosis.
Minor left foraminal stenosis.

MRI THORACIC SPINE

Motion artifact is present.

Alignment: Preserved.

Vertebrae: Multilevel degenerative endplate irregularity. Chronic
appearing degenerative endplate marrow changes, particularly at
T5-T6 to T7-T8. No substantial marrow edema. No suspicious osseous
lesion.

Cord:  No abnormal signal within above limitation.

Paraspinal and other soft tissues: Unremarkable.

Disc levels: Mild multilevel degenerative disc disease with minor
disc bulges or protrusions. Mild facet arthropathy. No high-grade
degenerative stenosis.

MRI LUMBAR SPINE

Motion artifact is present particularly on axial imaging.

Segmentation: Standard.

Alignment:  Trace retrolisthesis at L3-L4.

Vertebrae: Vertebral body heights are maintained apart from small
Schmorl's nodes at the superior and inferior L3 on plates with minor
associated marrow edema. No suspicious osseous lesion.

Conus medullaris and cauda equina: Conus extends to the T12-L1
level. Conus and cauda equina appear normal.

Paraspinal and other soft tissues: Atrophy of the inferior left
psoas.

Disc levels:

L1-L2:  No canal or foraminal stenosis.

L2-L3:  No canal or foraminal stenosis.

L3-L4: Disc bulge. No canal stenosis. No right foraminal stenosis.
Minor left foraminal stenosis.

L4-L5: Disc bulge with endplate osteophytic ridging eccentric to the
left. Facet arthropathy. No canal or right foraminal stenosis. Mild
to moderate left foraminal stenosis.

L5-S1: Small right subarticular disc protrusion. Facet arthropathy.
No canal stenosis. Slight effacement of the right subarticular
recess. No foraminal stenosis.
IMPRESSION: No abnormal cord signal. No high-grade canal stenosis. No evidence
of discitis/osteomyelitis or epidural collection.

Multilevel degenerative changes as detailed above.

## 2020-12-08 MED ORDER — HEPARIN SODIUM (PORCINE) 1000 UNIT/ML IJ SOLN
INTRAMUSCULAR | Status: AC
  Administered 2020-12-08: 1000 [IU]
  Filled 2020-12-08: qty 8

## 2020-12-08 MED ORDER — HEPARIN SODIUM (PORCINE) 1000 UNIT/ML DIALYSIS: 4000.0000 [IU] | INTRAMUSCULAR | Status: DC | PRN

## 2020-12-08 MED ORDER — DARBEPOETIN ALFA 60 MCG/0.3ML IJ SOSY
60.0000 ug | PREFILLED_SYRINGE | INTRAMUSCULAR | Status: DC
  Administered 2020-12-22: 60 ug via INTRAVENOUS
  Filled 2020-12-08 (×3): qty 0.3

## 2020-12-08 MED ORDER — VANCOMYCIN HCL IN DEXTROSE 1-5 GM/200ML-% IV SOLN
INTRAVENOUS | Status: AC
  Administered 2020-12-08: 1000 mg
  Filled 2020-12-08: qty 200

## 2020-12-08 MED ORDER — DOXERCALCIFEROL 4 MCG/2ML IV SOLN
INTRAVENOUS | Status: AC
  Administered 2020-12-08: 2 ug via INTRAVENOUS
  Filled 2020-12-08: qty 2

## 2020-12-08 MED ORDER — DARBEPOETIN ALFA 60 MCG/0.3ML IJ SOSY
PREFILLED_SYRINGE | INTRAMUSCULAR | Status: AC
  Administered 2020-12-08: 60 ug via INTRAVENOUS
  Filled 2020-12-08: qty 0.3

## 2020-12-08 MED ORDER — METOPROLOL TARTRATE 12.5 MG HALF TABLET: 12.5000 mg | ORAL_TABLET | Freq: Two times a day (BID) | ORAL | Status: DC

## 2020-12-08 NOTE — Progress Notes (Signed)
Pt arrived from dialysis, BP slightly off baseline 72/58. Medications administered. Will continue to monitor. Chrisandra Carota, RN

## 2020-12-08 NOTE — Progress Notes (Addendum)
NEUROLOGY PROGRESS NOTE  Patient ID: Kyle Walton is a 65 year old male w/pmh of PAF, diabetes c/b multiple toe amputations, obesity, OSA who presents with right leg pain for which neurology was consulted.  Subjective: No acute events overnight, are waiting on MRI Pelvis which was ordered as a part of work up to rule out infectious process that could be contributing to leg pain  He reports worsening ability to move his bilateral lower extremities and unchanged pain in the right lateral thigh.  He reports "some" pain in his back but none in his neck  Physical Exam  Constitutional: Chronically ill.    Examined while undergoing dialysis.  Blood pressure initially low but rose with examination secondary to pain Psych:  Sleepy but cooperative Eyes: No scleral injection HENT: No oropharyngeal obstruction.  Poor dentition MSK: Retains only in the last 3 toes on the left foot, no toes on the right.  Palpation of the back was extremely limited secondary to body habitus and patient's limited ability to turn secondary to pain.  Certainly he has no cervical spine tenderness, but did report thoracic or lumbar spine pain that was "not as bad as the leg" Cardiovascular: Mild tachycardia, but sinus rhythm on monitor Respiratory: Effort normal, non-labored breathing GI: Soft.  Protuberant.  There is no tenderness.  Skin: Right lower extremity was recently bandaged.  Thigh in the area of pain remains indurated and red, possibly slightly worse than prior exam certainly no better.  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    Neuro: Mental Status: Patient is very sleepy, intermittently falling asleep during examination, alert, oriented to person, place,  month and 2022   Patient is able to give limited history due to frequently falling asleep during examination Cranial Nerves: II: Pupils are equal and round.  Visual fields not tested as patient is legally blind III,IV, VI: Largely keeps his eyes closed today secondary to sleepiness/fatigue V: Facial sensation is symmetric to eyelash brush VII: Facial movement is symmetric.  VIII: hearing is intact to voice Motor: Tone is normal. Bulk is normal. 5/5 strength throughout the upper extremities, continues to have stable mild asterixis slightly improved compared to last examination. Right lower extremity is extremely pain limited for hip flexion but at least 1/5 for hip flexion, 3/5 knee extension, does not participate in deferred flexion secondary to pain, 2-3 dorsiflexion/plantarflexion but inconsistent effort and certainly does not provide any resistance Left lower extremity is 1/5 for hip flexion, 1/5 in both knee extension and flexion, 1/5 in both plantar and dorsiflexion Sensory: Sensation is symmetric to light touch and temperature in the arms and legs, reduced in a length dependent fashion.  Significant allodynia to touch in the lateral thigh, without involvement of the medial thigh, extending down to the knee but not involving the entirety of the popliteal fossa.  No allodynia in the calf or shin Deep Tendon Reflexes: 2+ and symmetric in the patellae, more easily elicited than previously.   Pertinent Labs/Diagnostics:  Basic Metabolic Panel: Recent Labs  Lab 12/04/20 0412 12/05/20 0721 12/06/20 0042 12/06/20 0700 12/07/20 0056 12/08/20 0848  NA 136 136 134*  --  132* 132*  K 4.2 4.6 4.7  --  4.0 4.0  CL 99 100  98  --  95* 97*  CO2 23 20* 22  --  24 24  GLUCOSE 142* 119* 196*  --  127* 114*  BUN 30* 43* 54*  --  30* 42*  CREATININE 6.10* 7.54* 8.64*  --  5.56* 6.65*  CALCIUM 8.3* 8.4* 8.3*  --  8.1* 8.5*  PHOS  --  6.9*  --  7.9* 5.1* 5.9*    CBC: Recent Labs  Lab 12/03/20 1311  12/04/20 0412 12/05/20 0721 12/06/20 0042 12/07/20 0056 12/08/20 0847  WBC 18.3* 16.2* 17.3* 15.3* 16.0* 15.8*  NEUTROABS 15.8*  --   --   --   --   --   HGB 10.9* 10.2* 9.8* 9.4* 10.2* 10.1*  HCT 33.9* 32.0* 30.9* 29.9* 31.6* 32.6*  MCV 95.0 94.1 93.9 94.0 92.7 94.5  PLT 148* 145* 133* 149* 156 197    CT Femur Right WO Contrast 12/05/20 1.  No acute osseous injury of the right femur. 2. Soft tissue edema and skin thickening along the lateral aspect of the upper right thigh and circumferential subcutaneous edema throughout the remainder of the thigh extending into the lower leg.This likely reflects reactive edema secondary to fluid overload and less likely cellulitis. 3. No drainable fluid collections.  VAS [name] Lower Extremity Venous Right 12/05/20  No DVT RLE    Assessment:  Westen Cancellieri is a 65 year old male w/pmh of obesity, diabetes,PAF, OSA, being treated for cellulitis whom neurology was consulted for right leg pain.    The severe pain he is complaining of remains restricted to the distribution of the lateral cutaneous femoral nerve, consistent with meralgia paresthetica, likely secondary to edema of the right lower extremity due to his cellulitis.  This is supported by CT findings of edema in the area, as well as examination with edema and induration of the area.  However I am concerned by his worsening bilateral lower extremity weakness.  Although his original blood cultures from 5/27 were negative, he continues to have leukocytosis of 16.  Additionally he has pain on palpation of the spine, which was limited secondary to his body habitus.  Therefore will additionally obtain MRI C-spine, T-spine and lumbar spine to rule out spinal epidural abscess.   Recommendations: - MRI pelvis without contrast; - MRI C-spine, T-spine and L-spine without contrast (higher priority than MRI pelvis), reaching out to nephrology to see if this could be obtained with contrast for better  sensitivity - For now, continue topical lidocaine in the entirety of the painful area, applied 3 times daily, was not yet applied this morning due to patient being in dialysis but does not appear to be providing much benefit - Continued management of cellulitis per primary team - Outpatient neurology follow-up for EMG/nerve conduction study if symptoms persist greater than 3 weeks (mid June or later given symptom onset about 1 week ago); referral placed earlier this hospitalization - Neurology will continue to follow  Note compiled with assistance of Courtney Heard,NP  [facility] 12/08/2020, 8:38 AM   Attending Neurologist's note:  I personally saw this patient, gathering history, performing the neurologic examination, reviewing relevant labs, personally reviewing relevant imaging including CT femur and formulated the assessment and plan, adding the note above for completeness and clarity to accurately reflect my thoughts  Lesleigh Noe MD-PhD [facility] [phone] Available 7 AM to 7 PM, outside these hours please contact Neurologist on call listed on AMION   Greater than 35 minutes were spent in the care of this patient  today, over which more than 50% was at bedside performing examination as documented above which was quite challenging given patient's pain and mental status.  Additionally discussed case with nephrology and primary team as well as MRI technicians and dialysis bedside team

## 2020-12-08 NOTE — Progress Notes (Signed)
Patient stated that he is unsure if he wants to wear his CPAP tonight. RT checked with patient twice, RN informed to call RT if the patient changes his mind and decides to wear CPAP.

## 2020-12-08 NOTE — Care Management Important Message (Signed)
Important Message  Patient Details  Name: Kyle Walton MRN: [id number] Date of Birth: April 06, 1956   Medicare Important Message Given:  Yes     [name] 12/08/2020, 9:50 AM

## 2020-12-08 NOTE — Progress Notes (Signed)
Informed of MRI for today.   Device system confirmed to be MRI conditional, with implant date > 6 weeks ago and no evidence of abandoned or epicardial leads in review of most recent CXR Interrogation from today reviewed, pt is currently AF-VS, with only 1.3% V pacing.   Change device settings for MRI to ODO  Tachy-therapies to off if applicable.  Program device back to pre-MRI settings after completion of exam.  Annamaria Helling  12/08/2020 3:15 PM

## 2020-12-08 NOTE — Progress Notes (Signed)
Patient down for MRI from 4E9. Patient has medtronic device. [facility] express sent. Orders received for ODO

## 2020-12-08 NOTE — Progress Notes (Signed)
[facility] Progress Note   Subjective:   Pt seen on HD.   Objective Vitals:   12/08/20 0830 12/08/20 0900 12/08/20 0930 12/08/20 1000  BP: (!) 98/44 (!) 97/31 134/79 119/73  Pulse:      Resp:      Temp:      TempSrc:      SpO2:      Weight:      Height:       Physical Exam General: Well developed male, alert and in NAD Heart: Tachycardic, regular rhythm, no murmur auscultated Lungs: CTA bilaterally without wheezing, rhonchi or rales Abdomen: Soft, non-tender, non-distended, +BS Extremities: 1+ pretibial edema, R lower leg scattered shallow wounds lower leg, no rash or lesions on the R upper leg; bilat toe amps Dialysis Access:  L IJ TDC   OP HD[facility] MWF 4h 450/800 117kg 2K/3Ca bath TDC Hep 4900 + 500u/hr - zemplar 3.5 ug tiw - aranesp 85 mg q wed  Assessment/Plan: 1. RLE Cellulitis - on abx per primary team (vancomycin). Bld cxs from 5/27 and 5/28 both NTD. WBC 10-15 range, afeb since admission.  2. R hip / upper leg pain - no lesions on exam or by imaging (CT). Neurology evaluating today.  3. ESRD - MWF HD w/ [facility] in [place]. HD today.  2. HTN/ vol - BP's are soft, have dc'd low dose norvasc.On metoprolol for afib. Will set higher goal for w/ LE edema , up 6-7kg by wts. Midodrine 10 tid added here.  3. MBD ckd - cont phoslo, vdra. Phos 5.1- 7.9 here.  4. Pruritis- chronic, atarax on med list 5. Anemia ckd - last Hb9.4.Cont darbe 75 weekly. 6. Atrial fib - per pmd, on home eliquis 2.5 bid and home amio/ metoprolol 7. CAD hx CABG/ PCI - on home imdur, plavix, metoprolol  Kelly Splinter, MD 12/08/2020, 10:06 AM   Additional Objective Labs: Basic Metabolic Panel: Recent Labs  Lab 12/06/20 0042 12/06/20 0700 12/07/20 0056 12/08/20 0848  NA 134*  --  132* 132*  K 4.7  --  4.0 4.0  CL 98  --  95* 97*  CO2 22  --  24 24  GLUCOSE 196*  --  127* 114*  BUN 54*  --  30* 42*  CREATININE 8.64*  --  5.56* 6.65*  CALCIUM 8.3*  --   8.1* 8.5*  PHOS  --  7.9* 5.1* 5.9*   Liver Function Tests: Recent Labs  Lab 12/03/20 1311 12/05/20 0721 12/06/20 0042 12/07/20 0056 12/08/20 0848  AST 38  --  44*  --   --   ALT 12  --  15  --   --   ALKPHOS 77  --  66  --   --   BILITOT 1.3*  --  1.0  --   --   PROT 6.8  --  5.7*  --   --   ALBUMIN 2.9*   < > 2.3* 2.5* 2.4*   < > = values in this interval not displayed.   No results for input(s): LIPASE, AMYLASE in the last 168 hours. CBC: Recent Labs  Lab 12/03/20 1311 12/04/20 0412 12/05/20 0721 12/06/20 0042 12/07/20 0056 12/08/20 0847  WBC 18.3* 16.2* 17.3* 15.3* 16.0* 15.8*  NEUTROABS 15.8*  --   --   --   --   --   HGB 10.9* 10.2* 9.8* 9.4* 10.2* 10.1*  HCT 33.9* 32.0* 30.9* 29.9* 31.6* 32.6*  MCV 95.0 94.1 93.9 94.0 92.7 94.5  PLT 148* 145* 133* 149* 156 197   Blood Culture    Component Value Date/Time   SDES BLOOD RIGHT HAND 12/04/2020 0412   SPECREQUEST  12/04/2020 0412    BOTTLES DRAWN AEROBIC AND ANAEROBIC Blood Culture results may not be optimal due to an inadequate volume of blood received in culture bottles   CULT  12/04/2020 0412    NO GROWTH 4 DAYS Performed at [facility], [place] [address], [place], [place] [address]    REPTSTATUS PENDING 12/04/2020 [id number]    Cardiac Enzymes: No results for input(s): CKTOTAL, CKMB, CKMBINDEX, TROPONINI in the last 168 hours. CBG: Recent Labs  Lab 12/07/20 0610 12/07/20 1158 12/07/20 1656 12/07/20 2153 12/08/20 0610  GLUCAP 118* 90 83 88 74   Iron Studies: No results for input(s): IRON, TIBC, TRANSFERRIN, FERRITIN in the last 72 hours. '@lablastinr3'$ @ Studies/Results: No results found. Medications: . vancomycin     . acetaminophen  1,000 mg Oral Q8H  . amiodarone  200 mg Oral Daily  . apixaban  2.5 mg Oral BID  . atorvastatin  40 mg Oral Daily  . calcium acetate  1,334 mg Oral TID WC  . Chlorhexidine Gluconate Cloth  6 each Topical Q0600  . cholestyramine light  4 g Oral QID  . clopidogrel   75 mg Oral Daily  . darbepoetin (ARANESP) injection - DIALYSIS  60 mcg Intravenous Q Wed-HD  . doxercalciferol  2 mcg Intravenous Q M,W,F-HD  . insulin aspart  0-15 Units Subcutaneous TID WC  . insulin aspart  3 Units Subcutaneous TID WC  . insulin glargine  40 Units Subcutaneous QHS  . levothyroxine  12.5 mcg Oral QAC breakfast  . lidocaine   Topical TID  . metoprolol tartrate  25 mg Oral BID  . midodrine  10 mg Oral TID WC  . pantoprazole  40 mg Oral Daily  . pregabalin  25 mg Oral Daily  . sodium bicarbonate  325 mg Oral BID

## 2020-12-08 NOTE — Progress Notes (Signed)
Applied lidocaine cream lightly and gently. Patient screamed out as this RN barely touch his thigh. Will continue to monitor

## 2020-12-08 NOTE — Progress Notes (Signed)
OT Cancellation Note  Patient Details Name: Matthews Garnier MRN: [id number] DOB: July 23, 1955   Cancelled Treatment:    Reason Eval/Treat Not Completed: Patient at procedure or test/ unavailable (HD). OT will continue to follow for therapy session as schedule allows  Jaci Carrel 12/08/2020, 8:31 AM   Jesse Sans OTR/L Acute Rehabilitation Services Pager: [phone] Office: [phone]

## 2020-12-08 NOTE — Progress Notes (Signed)
PROGRESS NOTE        PATIENT DETAILS Name: Kyle Walton Age: 65 y.o. Sex: male Date of Birth: 06/22/1956 Admit Date: 12/03/2020 Admitting Physician Neena Rhymes, MD [id number], No Pcp Per (Inactive)  Brief Narrative: Patient is a 65 y.o. male PAF, CAD s/p CABG, AICD implantation, PAD-s/p numerous toe amputations, HTN, ESRD on HD MWF-admitted for right leg soft tissue infection and right upper thigh pain.  See below for further details.  Significant events: 5/27>> admit for RLE pain/right leg cellulitis. 5/29>> lethargy/encephalopathy-significant improvement post Narcan  Significant studies: 5/27>> x-ray right foot: No evidence of fracture/focal bone lesions. 5/27>> chest x-ray: Mild pulmonary vascular congestion. 5/29>> RLE Doppler: No DVT 5/29>> CT right lower extremity: Soft tissue edema/skin thickening along the lateral aspect of the upper right thigh.  No drainable fluid collection.  Antimicrobial therapy: Vancomycin: 5/27>> Cefepime: 5/27>>5/29  Microbiology data: 5/27>> Influenza/COVID PCR: Negative 5/27>> blood culture: No growth 5/28>> blood culture:No growth  Procedures : None  Consults: Nephrology, neurology  DVT Prophylaxis : apixaban (ELIQUIS) tablet 2.5 mg Start: 12/03/20 2200 apixaban (ELIQUIS) tablet 2.5 mg    Subjective: Just came back to his room after dialysis-he is somewhat lethargic but awake/alert.  Continues to complain of pain in his right lateral thigh area.  Claims his left leg is more weak today compared to the past few days.  Assessment/Plan: Sepsis due to RLE cellulitis: Improved-wound care per wound care team-blood cultures negative so far-continues to have lingering leukocytosis.   Right lateral thigh pain-with worsening weakness: Etiology unclear-Dopplers negative for DVT-CT without any abscess or obvious etiologies.  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  Started on transdermal Lidoderm and Lyrica without any significant response.  Reevaluated by neurology 6/1-although he has chronic deconditioning/chronic weakness at baseline-his left lower extremity appears to be more weak than his usual baseline compared to the past few days-neurology is already ordered MRI of his spine-MRI of his pelvis is also pending.    Acute toxic encephalopathy: Occurred on 5/29-given 1 dose of Narcan.  Currently completely awake and alert.  Minimize narcotics as much as possible.    ESRD on HD MWF: Nephrology following.  HTN: BP on the softer/lower side-continue midodrine-continue low-dose metoprolol.    Chronic diastolic heart failure: Volume status relatively stable-diuresis with HD.  CAD-s/p CABG 2017: No anginal symptoms-on Plavix/statin/beta-blocker  History of [id number] amiodarone, metoprolol and Eliquis.  History of AICD implantation  History of PAD-s/p multiple toe amputations bilaterally  DM-2: CBG stable-continue Lantus 40 units daily, 3 units of NovoLog with meals and SSI.  Follow and adjust  Recent Labs    12/07/20 2153 12/08/20 0610 12/08/20 1251  GLUCAP 88 74 116*   Hypothyroidism: Continue Synthroid  OSA: Continue CPAP nightly  Legally blind  Morbid Obesity: Estimated body mass index is 38.75 kg/m as calculated from the following:   Height as of this encounter: '5\' 10"'$  (1.778 m).   Weight as of this encounter: 122.5 kg.    Diet: Diet Order            Diet renal/carb modified with fluid restriction Diet-HS Snack? Nothing; Fluid restriction: 1200 mL Fluid; Room service appropriate? Yes with Assist; Fluid consistency: Thin  Diet effective now                  Code Status:  Full code   Family Communication: Delcie Roch (spouse)-[phone]-spoke over the phone on 5/31  Disposition Plan: Status is: Inpatient  Remains inpatient appropriate because:Inpatient level of care appropriate due to severity  of illness   Dispo: The patient is from: Home              Anticipated d/c is to: Home              Patient currently is not medically stable to d/c.   Difficult to place patient No     Barriers to Discharge: RLE pain/cellulitis-on IV antibiotics.  Antimicrobial agents: Anti-infectives (From admission, onward)   Start     Dose/Rate Route Frequency Ordered Stop   12/08/20 1007  vancomycin (VANCOCIN) 1-5 GM/200ML-% IVPB       Note to Pharmacy: Judieth Keens  : cabinet override      12/08/20 1007 12/08/20 1102   12/06/20 1203  vancomycin (VANCOCIN) 1-5 GM/200ML-% IVPB       Note to Pharmacy: Wallace Cullens   : cabinet override      12/06/20 1203 12/06/20 1206   12/04/20 1030  vancomycin (VANCOREADY) IVPB 1000 mg/200 mL        1,000 mg 200 mL/hr over 60 Minutes Intravenous  Once 12/04/20 0930 12/04/20 1230   12/03/20 2300  vancomycin (VANCOREADY) IVPB 1000 mg/200 mL        1,000 mg 200 mL/hr over 60 Minutes Intravenous Every M-W-F (Hemodialysis) 12/03/20 2019 12/09/20 2359   12/03/20 1830  ceFEPIme (MAXIPIME) 1 g in sodium chloride 0.9 % 100 mL IVPB  Status:  Discontinued        1 g 200 mL/hr over 30 Minutes Intravenous Every 24 hours 12/03/20 1802 12/05/20 1630   12/03/20 1800  vancomycin (VANCOREADY) IVPB 1000 mg/200 mL  Status:  Discontinued        1,000 mg 200 mL/hr over 60 Minutes Intravenous  Once 12/03/20 1756 12/03/20 1801   12/03/20 1300  vancomycin (VANCOREADY) IVPB 1750 mg/350 mL        1,750 mg 175 mL/hr over 120 Minutes Intravenous  Once 12/03/20 1252 12/03/20 1706   12/03/20 1300  cefTRIAXone (ROCEPHIN) 2 g in sodium chloride 0.9 % 100 mL IVPB        2 g 200 mL/hr over 30 Minutes Intravenous  Once 12/03/20 1252 12/03/20 1345       Time spent: 35 minutes-Greater than 50% of this time was spent in counseling, explanation of diagnosis, planning of further management, and coordination of care.  MEDICATIONS: Scheduled Meds: . acetaminophen  1,000 mg Oral  Q8H  . amiodarone  200 mg Oral Daily  . apixaban  2.5 mg Oral BID  . atorvastatin  40 mg Oral Daily  . calcium acetate  1,334 mg Oral TID WC  . Chlorhexidine Gluconate Cloth  6 each Topical Q0600  . cholestyramine light  4 g Oral QID  . clopidogrel  75 mg Oral Daily  . darbepoetin (ARANESP) injection - DIALYSIS  60 mcg Intravenous Q Wed-HD  . doxercalciferol  2 mcg Intravenous Q M,W,F-HD  . insulin aspart  0-15 Units Subcutaneous TID WC  . insulin aspart  3 Units Subcutaneous TID WC  . insulin glargine  40 Units Subcutaneous QHS  . levothyroxine  12.5 mcg Oral QAC breakfast  . lidocaine   Topical TID  . metoprolol tartrate  25 mg Oral BID  . midodrine  10 mg Oral TID WC  . pantoprazole  40 mg Oral Daily  . pregabalin  25 mg Oral Daily  . sodium bicarbonate  325 mg Oral BID   Continuous Infusions: . vancomycin     PRN Meds:.hydrOXYzine, naLOXone (NARCAN)  injection, nitroGLYCERIN, ondansetron (ZOFRAN) IV   PHYSICAL EXAM: Vital signs: Vitals:   12/08/20 1100 12/08/20 1130 12/08/20 1210 12/08/20 1300  BP: 112/67 (!) 159/88 100/62 93/61  Pulse:   89 (!) 122  Resp:    20  Temp:   98.5 F (36.9 C) 98.6 F (37 C)  TempSrc:   Axillary Oral  SpO2:    94%  Weight:   122.5 kg   Height:       Filed Weights   12/06/20 1230 12/08/20 0813 12/08/20 1210  Weight: 124.1 kg 126.7 kg 122.5 kg   Body mass index is 38.75 kg/m.   Gen Exam: Likely drowsy/lethargic but comfortable.  Able to answer questions appropriately. HEENT:atraumatic, normocephalic Chest: B/L clear to auscultation anteriorly CVS:S1S2 regular Abdomen:soft non tender, non distended Extremities: Right thigh-lateral aspect is still tender-minimal erythema present.  Amputation of numerous toes bilaterally. Neurology: RLE-difficult to assess due to pain.  LLE-able to move leg side-to-side, just about able to lift off bed.    I have personally reviewed following labs and imaging studies  LABORATORY DATA: CBC: Recent  Labs  Lab 12/03/20 1311 12/04/20 0412 12/05/20 0721 12/06/20 0042 12/07/20 0056 12/08/20 0847  WBC 18.3* 16.2* 17.3* 15.3* 16.0* 15.8*  NEUTROABS 15.8*  --   --   --   --   --   HGB 10.9* 10.2* 9.8* 9.4* 10.2* 10.1*  HCT 33.9* 32.0* 30.9* 29.9* 31.6* 32.6*  MCV 95.0 94.1 93.9 94.0 92.7 94.5  PLT 148* 145* 133* 149* 156 [id number]    Basic Metabolic Panel: Recent Labs  Lab 12/04/20 0412 12/05/20 0721 12/06/20 0042 12/06/20 0700 12/07/20 0056 12/08/20 0848  NA 136 136 134*  --  132* 132*  K 4.2 4.6 4.7  --  4.0 4.0  CL 99 100 98  --  95* 97*  CO2 23 20* 22  --  24 24  GLUCOSE 142* 119* 196*  --  127* 114*  BUN 30* 43* 54*  --  30* 42*  CREATININE 6.10* 7.54* 8.64*  --  5.56* 6.65*  CALCIUM 8.3* 8.4* 8.3*  --  8.1* 8.5*  PHOS  --  6.9*  --  7.9* 5.1* 5.9*    GFR: Estimated Creatinine Clearance: 14.5 mL/min (A) (by C-G formula based on SCr of 6.65 mg/dL (H)).  Liver Function Tests: Recent Labs  Lab 12/03/20 1311 12/05/20 0721 12/06/20 0042 12/07/20 0056 12/08/20 0848  AST 38  --  44*  --   --   ALT 12  --  15  --   --   ALKPHOS 77  --  66  --   --   BILITOT 1.3*  --  1.0  --   --   PROT 6.8  --  5.7*  --   --   ALBUMIN 2.9* 2.4* 2.3* 2.5* 2.4*   No results for input(s): LIPASE, AMYLASE in the last 168 hours. Recent Labs  Lab 12/06/20 0042  AMMONIA 56*    Coagulation Profile: Recent Labs  Lab 12/03/20 1311 12/04/20 0412  INR 1.6* 1.5*    Cardiac Enzymes: No results for input(s): CKTOTAL, CKMB, CKMBINDEX, TROPONINI in the last 168 hours.  BNP (last 3 results) No results for input(s): PROBNP in the last 8760 hours.  Lipid Profile: No results for input(s): CHOL, HDL, LDLCALC, TRIG, CHOLHDL, LDLDIRECT in  the last 72 hours.  Thyroid Function Tests: No results for input(s): TSH, T4TOTAL, FREET4, T3FREE, THYROIDAB in the last 72 hours.  Anemia Panel: No results for input(s): VITAMINB12, FOLATE, FERRITIN, TIBC, IRON, RETICCTPCT in the last 72  hours.  Urine analysis: No results found for: COLORURINE, APPEARANCEUR, LABSPEC, PHURINE, GLUCOSEU, HGBUR, BILIRUBINUR, KETONESUR, PROTEINUR, UROBILINOGEN, NITRITE, LEUKOCYTESUR  Sepsis Labs: Lactic Acid, Venous    Component Value Date/Time   LATICACIDVEN 1.9 12/03/2020 1444    MICROBIOLOGY: Recent Results (from the past 240 hour(s))  Culture, blood (Routine x 2)     Status: None   Collection Time: 12/03/20 12:32 PM   Specimen: BLOOD  Result Value Ref Range Status   Specimen Description BLOOD RIGHT ANTECUBITAL  Final   Special Requests   Final    BOTTLES DRAWN AEROBIC AND ANAEROBIC Blood Culture adequate volume   Culture   Final    NO GROWTH 5 DAYS Performed at [facility], [address], [place], [place] [address]    Report Status 12/08/2020 FINAL  Final  Resp Panel by RT-PCR (Flu A&B, Covid) Nasopharyngeal Swab     Status: None   Collection Time: 12/03/20 12:52 PM   Specimen: Nasopharyngeal Swab; Nasopharyngeal(NP) swabs in vial transport medium  Result Value Ref Range Status   SARS Coronavirus 2 by RT PCR NEGATIVE NEGATIVE Final    Comment: (NOTE) SARS-CoV-2 target nucleic acids are NOT DETECTED.  The SARS-CoV-2 RNA is generally detectable in upper respiratory specimens during the acute phase of infection. The lowest concentration of SARS-CoV-2 viral copies this assay can detect is 138 copies/mL. A negative result does not preclude SARS-Cov-2 infection and should not be used as the sole basis for treatment or other patient management decisions. A negative result may occur with  improper specimen collection/handling, submission of specimen other than nasopharyngeal swab, presence of viral mutation(s) within the areas targeted by this assay, and inadequate number of viral copies(<138 copies/mL). A negative result must be combined with clinical observations, patient history, and epidemiological information. The expected result is Negative.  Fact Sheet for  Patients:  EntrepreneurPulse.com.au  Fact Sheet for Healthcare Providers:  IncredibleEmployment.be  This test is no t yet approved or cleared by the [place] FDA and  has been authorized for detection and/or diagnosis of SARS-CoV-2 by FDA under an Emergency Use Authorization (EUA). This EUA will remain  in effect (meaning this test can be used) for the duration of the COVID-19 declaration under Section 564(b)(1) of the Act, 21 U.S.C.section 360bbb-3(b)(1), unless the authorization is terminated  or revoked sooner.       Influenza A by PCR NEGATIVE NEGATIVE Final   Influenza B by PCR NEGATIVE NEGATIVE Final    Comment: (NOTE) The Xpert Xpress SARS-CoV-2/FLU/RSV plus assay is intended as an aid in the diagnosis of influenza from Nasopharyngeal swab specimens and should not be used as a sole basis for treatment. Nasal washings and aspirates are unacceptable for Xpert Xpress SARS-CoV-2/FLU/RSV testing.  Fact Sheet for Patients: EntrepreneurPulse.com.au  Fact Sheet for Healthcare Providers: IncredibleEmployment.be  This test is not yet approved or cleared by the [place] FDA and has been authorized for detection and/or diagnosis of SARS-CoV-2 by FDA under an Emergency Use Authorization (EUA). This EUA will remain in effect (meaning this test can be used) for the duration of the COVID-19 declaration under Section 564(b)(1) of the Act, 21 U.S.C. section 360bbb-3(b)(1), unless the authorization is terminated or revoked.  Performed at [facility], [place]  [address], [place], [place] [address]   Culture, blood (Routine x 2)     Status: None (Preliminary result)   Collection Time: 12/04/20  4:12 AM   Specimen: BLOOD RIGHT HAND  Result Value Ref Range Status   Specimen Description BLOOD RIGHT HAND  Final   Special Requests   Final    BOTTLES DRAWN AEROBIC AND ANAEROBIC Blood Culture results may not  be optimal due to an inadequate volume of blood received in culture bottles   Culture   Final    NO GROWTH 4 DAYS Performed at [facility], [place] [address], [place], [place] [address]    Report Status PENDING  Incomplete    RADIOLOGY STUDIES/RESULTS: No results found.   LOS: 5 days   Oren Binet, MD  [facility]    To contact the attending provider between 7A-7P or the covering provider during after hours 7P-7A, please log into the web site [email or web address] and access using universal [place] password for that web site. If you do not have the password, please call the hospital operator.  12/08/2020, 1:33 PM

## 2020-12-08 NOTE — Progress Notes (Signed)
lantus held for tonight per Dr. Cyd Silence - CBG 122. Will continue to monitor

## 2020-12-09 DIAGNOSIS — E1151 Type 2 diabetes mellitus with diabetic peripheral angiopathy without gangrene: Secondary | ICD-10-CM | POA: Diagnosis not present

## 2020-12-09 DIAGNOSIS — G4733 Obstructive sleep apnea (adult) (pediatric): Secondary | ICD-10-CM

## 2020-12-09 DIAGNOSIS — L03115 Cellulitis of right lower limb: Secondary | ICD-10-CM | POA: Diagnosis not present

## 2020-12-09 DIAGNOSIS — G5711 Meralgia paresthetica, right lower limb: Secondary | ICD-10-CM | POA: Diagnosis not present

## 2020-12-09 DIAGNOSIS — I10 Essential (primary) hypertension: Secondary | ICD-10-CM | POA: Diagnosis not present

## 2020-12-09 DIAGNOSIS — N186 End stage renal disease: Secondary | ICD-10-CM | POA: Diagnosis not present

## 2020-12-09 LAB — GLUCOSE, CAPILLARY
Glucose-Capillary: 87 mg/dL (ref 70–99)
Glucose-Capillary: 133 mg/dL — ABNORMAL HIGH (ref 70–99)
Glucose-Capillary: 162 mg/dL — ABNORMAL HIGH (ref 70–99)
Glucose-Capillary: 142 mg/dL — ABNORMAL HIGH (ref 70–99)

## 2020-12-09 LAB — CULTURE, BLOOD (ROUTINE X 2): Culture: NO GROWTH

## 2020-12-09 MED ORDER — LIDOCAINE 5 % EX OINT
TOPICAL_OINTMENT | Freq: Three times a day (TID) | CUTANEOUS | Status: DC
  Filled 2020-12-09: qty 35.44

## 2020-12-09 MED ORDER — SODIUM THIOSULFATE 250 MG/ML IV SOLN
25.0000 g | INTRAVENOUS | Status: DC
  Administered 2020-12-11 – 2020-12-31 (×8): 25 g via INTRAVENOUS
  Filled 2020-12-09 (×17): qty 100

## 2020-12-09 MED ORDER — SEVELAMER CARBONATE 800 MG PO TABS
1600.0000 mg | ORAL_TABLET | Freq: Three times a day (TID) | ORAL | Status: DC
  Administered 2020-12-09 – 2020-12-31 (×56): 1600 mg via ORAL
  Filled 2020-12-09 (×58): qty 2

## 2020-12-09 MED ORDER — INSULIN GLARGINE 100 UNIT/ML ~~LOC~~ SOLN
10.0000 [IU] | Freq: Every day | SUBCUTANEOUS | Status: DC
  Administered 2020-12-09 – 2020-12-15 (×7): 10 [IU] via SUBCUTANEOUS
  Filled 2020-12-09 (×9): qty 0.1

## 2020-12-09 MED ORDER — POLYETHYLENE GLYCOL 3350 17 G PO PACK
17.0000 g | PACK | Freq: Every day | ORAL | Status: DC
  Administered 2020-12-10 – 2020-12-30 (×19): 17 g via ORAL
  Filled 2020-12-09 (×21): qty 1

## 2020-12-09 MED ORDER — LIDOCAINE 4 % EX CREA: TOPICAL_CREAM | Freq: Three times a day (TID) | CUTANEOUS | Status: DC

## 2020-12-09 MED ORDER — LIDOCAINE 5 % EX OINT
TOPICAL_OINTMENT | Freq: Three times a day (TID) | CUTANEOUS | Status: DC
  Administered 2020-12-15 – 2020-12-30 (×3): 1 via TOPICAL
  Filled 2020-12-09 (×3): qty 35.44

## 2020-12-09 MED ORDER — SENNA 8.6 MG PO TABS: 2.0000 | ORAL_TABLET | Freq: Every day | ORAL | Status: DC | PRN

## 2020-12-09 NOTE — NC FL2 (Signed)
[place] LEVEL OF CARE SCREENING TOOL     IDENTIFICATION  Patient Name: Kyle Walton Birthdate: [birth year] Sex: male Admission Date (Current Location): 12/03/2020  [facility] and [place] Number:  Herbalist and Address:  [facility], [place] [address] [place], [place] [address]      Provider Number: [id number]  Attending Physician Name and Address:  Jonetta Osgood, MD  Relative Name and Phone Number:       Current Level of Care: Hospital Recommended Level of Care: [place] Prior Approval Number:    Date Approved/Denied:   PASRR Number: [id number]  Discharge Plan: SNF    Current Diagnoses: Patient Active Problem List   Diagnosis Date Noted  . Cellulitis of right leg 12/03/2020  . Sepsis ([place]) 12/03/2020  . Aggressive behavior   . CAD (coronary artery disease) 10/27/2020  . 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  . Cardiopulmonary arrest with successful resuscitation ([place]) 10/27/2020  . Diabetes mellitus with peripheral vascular disease ([place]) 10/27/2020  . History of DVT (deep vein thrombosis) 10/27/2020  . Essential hypertension 10/27/2020  . OSA (obstructive sleep apnea) 10/27/2020  . Paroxysmal atrial fibrillation ([place]) 10/27/2020  . Secondary hyperparathyroidism, renal ([place]) 10/27/2020  . 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 Chronic diastolic congestive heart failure, NYHA class 4 ([place]) 10/27/2020  . ESRD on hemodialysis ([place]) 10/27/2020  . Impaired physical mobility 10/27/2020  . Chronic respiratory failure with hypercapnia ([place]) 10/27/2020  . 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 History of major depression 10/27/2020  . History of macular degeneration 10/27/2020  . Legally blind 10/27/2020    Orientation RESPIRATION BLADDER Height & Weight     Self,Time,Situation,Place  O2 Continent Weight: 262 lb 2 oz (118.9 kg) Height:  '5\' 10"'$  (177.8 cm)   BEHAVIORAL SYMPTOMS/MOOD NEUROLOGICAL BOWEL NUTRITION STATUS      Incontinent Diet (please see discharge summary)  AMBULATORY STATUS COMMUNICATION OF NEEDS Skin   Limited Assist Verbally Surgical wounds,Other (Comment) (wound/incision pretibial RT cellulitis)                       Personal Care Assistance Level of Assistance  Bathing,Feeding,Dressing Bathing Assistance: Limited assistance Feeding assistance: Independent Dressing Assistance: Limited assistance     Functional Limitations Info  Sight,Speech,Hearing Sight Info: Impaired (blind) Hearing Info: Adequate Speech Info: Adequate    SPECIAL CARE FACTORS FREQUENCY  PT (By licensed PT),OT (By licensed OT)     PT Frequency: 5x per week OT Frequency: 5x per week            Contractures Contractures Info: Not present    Additional Factors Info  Code Status,Allergies,Isolation Precautions Code Status Info: FULL Allergies Info: Morphine,Liraglutide     Isolation Precautions Info: ESBL, VRE,MRSA     Current Medications (12/09/2020):  This is the current hospital active medication list Current Facility-Administered Medications  Medication Dose Route Frequency Provider Last Rate Last Admin  . acetaminophen (TYLENOL) tablet 1,000 mg  1,000 mg Oral Q8H Ghimire, Henreitta Leber, MD   1,000 mg at 12/09/20 1406  . amiodarone (PACERONE) tablet 200 mg  200 mg Oral Daily Norins, Heinz Knuckles, MD   200 mg at 12/09/20 0919  . apixaban (ELIQUIS) tablet 2.5 mg  2.5 mg Oral BID Norins, Heinz Knuckles, MD   2.5 mg at 12/09/20 0919  . atorvastatin (LIPITOR) tablet 40 mg  40 mg Oral Daily Norins, Heinz Knuckles,  MD   40 mg at 12/09/20 0918  . Chlorhexidine Gluconate Cloth 2 % PADS 6 each  6 each Topical Q0600 Dwana Melena, MD   6 each at 12/09/20 [phone]  . cholestyramine light (PREVALITE) packet 4 g  4 g Oral QID Jonetta Osgood, MD   4 g at 12/09/20 1408  . clopidogrel (PLAVIX) tablet 75 mg  75 mg Oral Daily Norins, Heinz Knuckles, MD   75 mg at 12/09/20  0919  . Darbepoetin Alfa (ARANESP) injection 60 mcg  60 mcg Intravenous Q Wed-HD Roney Jaffe, MD   60 mcg at 12/08/20 1019  . hydrOXYzine (ATARAX/VISTARIL) tablet 25 mg  25 mg Oral BID PRN Vianne Bulls, MD   25 mg at 12/06/20 1315  . insulin aspart (novoLOG) injection 0-15 Units  0-15 Units Subcutaneous TID WC Norins, Heinz Knuckles, MD   2 Units at 12/09/20 1224  . insulin glargine (LANTUS) injection 10 Units  10 Units Subcutaneous QHS Ghimire, Shanker M, MD      . levothyroxine (SYNTHROID) tablet 12.5 mcg  12.5 mcg Oral QAC breakfast Norins, Heinz Knuckles, MD   12.5 mcg at 12/09/20 0529  . [START ON 12/10/2020] lidocaine (XYLOCAINE) 5 % ointment   Topical TID Jonetta Osgood, MD      . midodrine (PROAMATINE) tablet 10 mg  10 mg Oral TID WC Ghimire, Henreitta Leber, MD   10 mg at 12/09/20 1240  . naloxone [facility]) injection 0.4 mg  0.4 mg Intravenous PRN Jonetta Osgood, MD   0.4 mg at 12/05/20 1313  . nitroGLYCERIN (NITROSTAT) SL tablet 0.4 mg  0.4 mg Sublingual Q5 min PRN Norins, Heinz Knuckles, MD      . ondansetron [facility]) injection 4 mg  4 mg Intravenous Q6H PRN Norins, Heinz Knuckles, MD   4 mg at 12/06/20 2120  . pantoprazole (PROTONIX) EC tablet 40 mg  40 mg Oral Daily Norins, Heinz Knuckles, MD   40 mg at 12/09/20 0920  . polyethylene glycol (MIRALAX / GLYCOLAX) packet 17 g  17 g Oral Daily Ghimire, Shanker M, MD      . pregabalin (LYRICA) capsule 25 mg  25 mg Oral Daily Jonetta Osgood, MD   25 mg at 12/09/20 0919  . senna (SENOKOT) tablet 17.2 mg  2 tablet Oral Daily PRN Jonetta Osgood, MD      . sevelamer carbonate (RENVELA) tablet 1,600 mg  1,600 mg Oral TID WC Roney Jaffe, MD      . sodium bicarbonate tablet 325 mg  325 mg Oral BID Neena Rhymes, MD   325 mg at 12/09/20 0919  . 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 vancomycin (VANCOREADY) IVPB 1000 mg/200 mL  1,000 mg  Intravenous Q M,W,F-HD Ghimire, Henreitta Leber, MD         Discharge Medications: Please see discharge summary for a list of discharge medications.  Relevant Imaging Results:  Relevant Lab Results:   Additional Information SSN [id number]   patient has received covid vaccine and booster shot,   patient uses cpap (Adult full face mask),   HD patient @ [facility] in [place]  M,W,F around [place], [place]

## 2020-12-09 NOTE — Progress Notes (Signed)
Patient on 4L O2 sats >92%

## 2020-12-09 NOTE — Progress Notes (Signed)
Pt stated he doesn't want to wear CPAP for the night.  

## 2020-12-09 NOTE — Procedures (Signed)
*   No surgery found *  12:10 PM  PATIENT:  Kyle Walton  65 y.o. male  PRE-OPERATIVE DIAGNOSIS:  Right thigh rash  POST-OPERATIVE DIAGNOSIS: SAA  PROCEDURE:  Right thigh punch biopsy  SURGEON:  Saverio Danker, PA-C ASSISTANTS: none   ANESTHESIA:   local  DRAINS: none   LOCAL MEDICATIONS USED:  LIDOCAINE   SPECIMEN:  Biopsy / Limited Resection  DISPOSITION OF SPECIMEN:  PATHOLOGY  INDICATION FOR PROCEDURE: This is a 65 yo white male admitted for progressive right thigh pain with an inability to mobilize on this who has now developed a right thigh erythematous rash.  He has undergone imaging which is inconclusive.  A skin biopsy has been requested.  PROCEDURE: The site was cleaned and prepped in the normal sterile fashion after verbal informed consent was obtained.  The site was then anesthetized with lidocaine.  A 68m punch was used to biopsy.  Hemostasis was achieved with pressure and a dry dressing was placed over the site.  PLAN OF CARE: remains inpatient in his room  PATIENT DISPOSITION:  in his room, stable and tolerated the procedure well   KHenreitta Cea12:17 PM 12/09/2020

## 2020-12-09 NOTE — Plan of Care (Signed)
  Problem: Health Behavior/Discharge Planning: Goal: Ability to manage health-related needs will improve Outcome: Not Progressing   Problem: Clinical Measurements: Goal: Will remain free from infection Outcome: Not Progressing

## 2020-12-09 NOTE — Progress Notes (Signed)
Occupational Therapy Treatment Patient Details Name: Kyle Walton MRN: [id number] DOB: 29-Aug-1955 Today's Date: 12/09/2020    History of present illness Pt is a 65 y.o. male who presented 5/27 with R leg cellulitis and sepsis. PMH: PAF, OSA, MI, ESRD on HD MWF, DM on insulin, s/p multiple toe amputations, morbid obesity, and CAD.   OT comments  Pt with gradual progression towards OT goals. Pt continues to be limited by R LE pain and severe deconditioning, requiring Total A for LB ADLs bed level. Coordinated with RNs for transfer to [facility] bed during session. Pt overall Max A x 2 for bed mobility, able to hold sitting balance with UE support EOB. Pt required Max A x 3 for sit to stands in [place] today. Pt able to stand in [place] for 3 minutes for bed switch before fatiguing. VSS on RA. Continue to recommend SNF due to pt below baseline functioning and requires heavy assist x 2-3 for tasks.    Follow Up Recommendations  SNF;Supervision/Assistance - 24 hour    Equipment Recommendations  3 in 1 bedside commode;Tub/shower bench;Wheelchair (measurements OT);Wheelchair cushion (measurements OT);Hospital bed    Recommendations for Other Services      Precautions / Restrictions Precautions Precautions: Fall;Other (comment) Precaution Comments: contact precautions Restrictions Weight Bearing Restrictions: No       Mobility Bed Mobility Overal bed mobility: Needs Assistance Bed Mobility: Supine to Sit;Sit to Supine     Supine to sit: Max assist;+2 for physical assistance;+2 for safety/equipment;HOB elevated Sit to supine: Max assist;+2 for physical assistance;+2 for safety/equipment   General bed mobility comments: Max A x 2 to get to EOB, able to assist in advancing L LE but heavy assist needed for trunk. Max A x 2 to get back to supine with assist for B LE and guiding of trunk    Transfers Overall transfer level: Needs assistance Equipment used: Ambulation equipment used Transfers:  Sit to/from Stand Sit to Stand: Max assist (+3)         General transfer comment: Max A + 3 for sit to stands in [place] (from bed and then from stedy pads) with assist from NT.    Balance Overall balance assessment: Needs assistance Sitting-balance support: Bilateral upper extremity supported;Feet supported Sitting balance-Leahy Scale: Poor Sitting balance - Comments: reliant on UE support EOB, brief moment of fair sitting balance unsupported   Standing balance support: Bilateral upper extremity supported;During functional activity Standing balance-Leahy Scale: Zero Standing balance comment: in [place]                           ADL either performed or assessed with clinical judgement   ADL Overall ADL's : Needs assistance/impaired                     Lower Body Dressing: Total assistance;+2 for physical assistance;+2 for safety/equipment Lower Body Dressing Details (indicate cue type and reason): Total A to don socks bed level, pt unable to successfully cross L LE over R LE via figure four position. Limited similarly R LE due to pain     Toileting- Clothing Manipulation and Hygiene: Total assistance;+2 for physical assistance;+2 for safety/equipment;Bed level Toileting - Clothing Manipulation Details (indicate cue type and reason): Total A bed level for cleanup after BM in bed       General ADL Comments: Very pain limited, also poor endurance and weakness impacting ability to complete tasks. Pt with decline requiring +3 for [facility]  transfer with RNs assisting to switch out beds     Vision   Vision Assessment?: No apparent visual deficits   Perception     Praxis      Cognition Arousal/Alertness: Awake/alert Behavior During Therapy: WFL for tasks assessed/performed Overall Cognitive Status: Within Functional Limits for tasks assessed                                          Exercises     Shoulder Instructions       General Comments  O2 WFL on RA, HR 100s with activity. BP stable supine and sitting EOB    Pertinent Vitals/ Pain       Pain Assessment: 0-10 Pain Score: 10-Worst pain ever Pain Location: R leg with movement Pain Descriptors / Indicators: Grimacing;Guarding;Moaning Pain Intervention(s): Monitored during session;Repositioned;Limited activity within patient's tolerance;Other (comment) (RN present and aware)  Home Living                                          Prior Functioning/Environment              Frequency  Min 2X/week        Progress Toward Goals  OT Goals(current goals can now be found in the care plan section)  Progress towards OT goals: Progressing toward goals  Acute Rehab OT Goals Patient Stated Goal: get strength back OT Goal Formulation: With patient Time For Goal Achievement: 12/19/20 Potential to Achieve Goals: Good ADL Goals Pt Will Transfer to Toilet: with mod assist;with transfer board;bedside commode Additional ADL Goal #1: Pt will complete bed mobility including rolling with min assist, to relieve pressure and ease caregiver burden Additional ADL Goal #2: Pt will sit EOB with min support to maintain upright position for 3 mins to prepare for seated ADL's.  Plan Discharge plan remains appropriate    Co-evaluation    PT/OT/SLP Co-Evaluation/Treatment: Yes Reason for Co-Treatment: For patient/therapist safety;To address functional/ADL transfers   OT goals addressed during session: ADL's and self-care      AM-PAC OT "6 Clicks" Daily Activity     Outcome Measure   Help from another person eating meals?: A Little Help from another person taking care of personal grooming?: A Little Help from another person toileting, which includes using toliet, bedpan, or urinal?: Total Help from another person bathing (including washing, rinsing, drying)?: A Lot Help from another person to put on and taking off regular upper body clothing?: A Lot Help from  another person to put on and taking off regular lower body clothing?: Total 6 Click Score: 12    End of Session Equipment Utilized During Treatment: Gait belt;Other (comment) Charlaine Dalton)  OT Visit Diagnosis: Unsteadiness on feet (R26.81);Other abnormalities of gait and mobility (R26.89);Muscle weakness (generalized) (M62.81)   Activity Tolerance Patient limited by pain   Patient Left in bed;with call bell/phone within reach;with bed alarm set;with nursing/sitter in room   Nurse Communication Mobility status        Time: [id number] OT Time Calculation (min): 43 min  Charges: OT General Charges $OT Visit: 1 Visit OT Treatments $Self Care/Home Management : 8-22 mins $Therapeutic Activity: 8-22 mins  Malachy Chamber, OTR/L Acute Rehab Services Office: [phone]   Layla Maw 12/09/2020, 2:59 PM

## 2020-12-09 NOTE — Consult Note (Signed)
Kyle Walton [birth year]  [id number].    Requesting MD: Dr. Sloan Leiter Chief Complaint/Reason for Consult: Thigh rash  HPI: Kyle Walton is a 65 y.o. male w/ hx of IDDM2, dHF, ESRD on HD (M/W/F), PAD, CAD on Plavix and PAF on Eliquis who we were asked to see to perform a punch bx.   Patient presented on 5/27 for 3 days of leg pain w/ inability to ambulate 2/2 the pain. He was admitted for suspected cellulitis of the right upper thigh w/ sepsis. He was placed on broad spectrum abx with reported improvement of redness. Neurology was consulted for continued pai and BLE weakness. He underwent RLE doppler that was negative for DVT. He underwent CT RLE that showed soft tissue edema/skin thickening along the lateral aspect of the upper right thigh.  No drainable fluid collection. MRI C, T and L spine negative for abnormal cord signal, high grade canal stenosis, discitis/osteo or epidural collection. Blood cx's have been negative. No hx of similar in the past.   New erythematous rash noted on his lateral right thigh and given pain and unclear etiology request was made for skin biopsy.  ROS: Review of Systems  Constitutional: Negative for chills and fever.  Musculoskeletal: Positive for myalgias.  Skin: Positive for rash.  Neurological: Positive for weakness.  All other systems reviewed and are negative.   History reviewed. No pertinent family history.  Past Medical History:  Diagnosis Date  . Diabetes mellitus without complication ([place])   . Diastolic heart failure ([place])   . ESRD (end stage renal disease) ([place])   . MI (myocardial infarction) ([place])   . OSA (obstructive sleep apnea)   . PAF (paroxysmal atrial fibrillation) ([place])   . Renal disorder    on dialysis    Past Surgical History:  Procedure Laterality Date  . CORONARY ARTERY BYPASS GRAFT  2017   LIMA LAD, SVG PDA, OM3  . DIALYSIS FISTULA CREATION    . TOE AMPUTATION Bilateral     Social History:  reports that he has never  smoked. He has never used smokeless tobacco. He reports previous alcohol use. He reports previous drug use.  Allergies:  Allergies  Allergen Reactions  . Morphine Other (See Comments)    Per son, [name], pt becomes unresponsive/disoriented. Especially when given after HD tx  . Liraglutide Diarrhea    Phenol Phenol     Medications Prior to Admission  Medication Sig Dispense Refill  . amiodarone (PACERONE) 200 MG tablet Take 200 mg by mouth daily.    Marland Kitchen amLODipine (NORVASC) 2.5 MG tablet Take 2.5 mg by mouth daily.    Marland Kitchen atorvastatin (LIPITOR) 40 MG tablet Take 40 mg by mouth daily.    . calcium acetate (PHOSLO) 667 MG capsule Take 1,334 mg by mouth 3 (three) times daily with meals.    . cholestyramine (QUESTRAN) 4 g packet Take 1 packet by mouth 2 (two) times daily.    . clopidogrel (PLAVIX) 75 MG tablet Take 75 mg by mouth daily.    Marland Kitchen ELIQUIS 2.5 MG TABS tablet Take 2.5 mg by mouth 2 (two) times daily.    . ergocalciferol (VITAMIN D2) 1.25 MG (50000 UT) capsule Take 50,000 Units by mouth every 30 (thirty) days.    Marland Kitchen gabapentin (NEURONTIN) 100 MG capsule Take 100 mg by mouth in the morning and at bedtime.    . hydrOXYzine (ATARAX/VISTARIL) 25 MG tablet Take 25 mg by mouth 2 (two) times daily.    . Insulin Glargine (  BASAGLAR KWIKPEN) 100 UNIT/ML Inject 40 Units into the skin at bedtime.    . insulin lispro (HUMALOG) 100 UNIT/ML KwikPen Inject into the skin. Inject two Units to twelve Units into the skin 15 (fifteen) minutes before meals for 30 days. 180-200 = 2 units 201-250 = 5 units 251-300 = 7 units 301-400 = 12 units and call MD    . isosorbide mononitrate (IMDUR) 30 MG 24 hr tablet Take 30 mg by mouth daily.    Marland Kitchen latanoprost (XALATAN) 0.005 % ophthalmic solution Place 1 drop into both eyes at bedtime.    Marland Kitchen levothyroxine (SYNTHROID) 25 MCG tablet Take 12.5 mcg by mouth daily before breakfast.    . metoprolol tartrate (LOPRESSOR) 50 MG tablet Take 50 mg by mouth 2 (two) times daily.    .  mupirocin ointment (BACTROBAN) 2 % Apply 1 application topically as directed. Apply 2-3 times daily    . pantoprazole (PROTONIX) 40 MG tablet Take 1 tablet by mouth daily.    . sodium bicarbonate 650 MG tablet Take 325 mg by mouth 2 (two) times daily.    . nitroGLYCERIN (NITROSTAT) 0.3 MG SL tablet Place 0.3 mg under the tongue every 5 (five) minutes as needed for chest pain.       Physical Exam: Blood pressure 122/80, pulse (!) 120, temperature 97.9 F (36.6 C), temperature source Oral, resp. rate 20, height '5\' 10"'$  (1.778 m), weight 118.9 kg, SpO2 97 %. General: pleasant, WD/WN male who is laying in bed in NAD HEENT: head is normocephalic, atraumatic.  Sclera are noninjected.  PERRL.  Ears and nose without any masses or lesions.  Mouth is pink and moist. Dentition poor Skin: Rash as noted in picture below.  Right thigh very tender and edematous. Skin otherwise warm and dry with no masses, lesions Psych: A&Ox4 with an appropriate affect        Results for orders placed or performed during the hospital encounter of 12/03/20 (from the past 48 hour(s))  Glucose, capillary     Status: None   Collection Time: 12/07/20 11:58 AM  Result Value Ref Range   Glucose-Capillary 90 70 - 99 mg/dL    Comment: Glucose reference range applies only to samples taken after fasting for at least 8 hours.  Glucose, capillary     Status: None   Collection Time: 12/07/20  4:56 PM  Result Value Ref Range   Glucose-Capillary 83 70 - 99 mg/dL    Comment: Glucose reference range applies only to samples taken after fasting for at least 8 hours.  Glucose, capillary     Status: None   Collection Time: 12/07/20  9:53 PM  Result Value Ref Range   Glucose-Capillary 88 70 - 99 mg/dL    Comment: Glucose reference range applies only to samples taken after fasting for at least 8 hours.   Comment 1 Document in Chart   Glucose, capillary     Status: None   Collection Time: 12/08/20  6:10 AM  Result Value Ref Range    Glucose-Capillary 74 70 - 99 mg/dL    Comment: Glucose reference range applies only to samples taken after fasting for at least 8 hours.  CBC     Status: Abnormal   Collection Time: 12/08/20  8:47 AM  Result Value Ref Range   WBC 15.8 (H) 4.0 - 10.5 K/uL   RBC 3.45 (L) 4.22 - 5.81 MIL/uL   Hemoglobin 10.1 (L) 13.0 - 17.0 g/dL   HCT 32.6 (L) 39.0 - 52.0 %  MCV 94.5 80.0 - 100.0 fL   MCH 29.3 26.0 - 34.0 pg   MCHC 31.0 30.0 - 36.0 g/dL   RDW 16.8 (H) 11.5 - 15.5 %   Platelets 197 150 - 400 K/uL   nRBC 0.0 0.0 - 0.2 %    Comment: Performed at [facility] [address] [place], [place] [address]  Renal function panel     Status: Abnormal   Collection Time: 12/08/20  8:48 AM  Result Value Ref Range   Sodium 132 (L) 135 - 145 mmol/L   Potassium 4.0 3.5 - 5.1 mmol/L   Chloride 97 (L) 98 - 111 mmol/L   CO2 24 22 - 32 mmol/L   Glucose, Bld 114 (H) 70 - 99 mg/dL    Comment: Glucose reference range applies only to samples taken after fasting for at least 8 hours.   BUN 42 (H) 8 - 23 mg/dL   Creatinine, Ser 6.65 (H) 0.61 - 1.24 mg/dL   Calcium 8.5 (L) 8.9 - 10.3 mg/dL   Phosphorus 5.9 (H) 2.5 - 4.6 mg/dL   Albumin 2.4 (L) 3.5 - 5.0 g/dL   GFR, Estimated 9 (L) >60 mL/min    Comment: (NOTE) Calculated using the CKD-EPI Creatinine Equation (2021)    Anion gap 11 5 - 15    Comment: Performed at [facility] [address], [place], [place] [address]  CK     Status: Abnormal   Collection Time: 12/08/20 11:45 AM  Result Value Ref Range   Total CK 24 (L) 49 - 397 U/L    Comment: Performed at [facility], [place] [address] [place], [place] [address]  Glucose, capillary     Status: Abnormal   Collection Time: 12/08/20 12:51 PM  Result Value Ref Range   Glucose-Capillary 116 (H) 70 - 99 mg/dL    Comment: Glucose reference range applies only to samples taken after fasting for at least 8 hours.  Glucose, capillary     Status: Abnormal   Collection Time: 12/08/20  6:09 PM   Result Value Ref Range   Glucose-Capillary 116 (H) 70 - 99 mg/dL    Comment: Glucose reference range applies only to samples taken after fasting for at least 8 hours.  Glucose, capillary     Status: Abnormal   Collection Time: 12/08/20  9:26 PM  Result Value Ref Range   Glucose-Capillary 122 (H) 70 - 99 mg/dL    Comment: Glucose reference range applies only to samples taken after fasting for at least 8 hours.  Glucose, capillary     Status: None   Collection Time: 12/09/20  6:13 AM  Result Value Ref Range   Glucose-Capillary 87 70 - 99 mg/dL    Comment: Glucose reference range applies only to samples taken after fasting for at least 8 hours.  Glucose, capillary     Status: Abnormal   Collection Time: 12/09/20 11:25 AM  Result Value Ref Range   Glucose-Capillary 133 (H) 70 - 99 mg/dL    Comment: Glucose reference range applies only to samples taken after fasting for at least 8 hours.   Comment 1 Notify RN    Comment 2 Document in Chart    MR CERVICAL SPINE WO CONTRAST  Result Date: 12/08/2020 CLINICAL DATA:  Worsening bilateral lower extremity weakness EXAM: MRI CERVICAL, THORACIC AND LUMBAR SPINE WITHOUT CONTRAST TECHNIQUE: Multiplanar and multiecho pulse sequences of the cervical spine, to include the craniocervical junction and cervicothoracic junction, and thoracic  and lumbar spine, were obtained without intravenous contrast. COMPARISON:  None. FINDINGS: MRI CERVICAL SPINE Alignment: No significant listhesis. Vertebrae: Vertebral body heights are maintained. No substantial marrow edema. No suspicious osseous lesion. Cord: No abnormal signal. Posterior Fossa, vertebral arteries, paraspinal tissues: Unremarkable. Disc levels: C2-C3: Left facet hypertrophy. No canal or right foraminal stenosis. Moderate left foraminal stenosis. C3-C4: Disc bulge with endplate osteophytes. Left facet hypertrophy. No canal or right foraminal stenosis. Moderate to marked left foraminal stenosis. C4-C5: Disc  bulge with endplate osteophytes. Right facet joint effusion. Left facet hypertrophy. No canal stenosis. No foraminal stenosis. C5-C6: Disc bulge with superimposed central disc protrusion and endplate osteophytes. Left facet hypertrophy. No canal or foraminal stenosis. C6-C7: Disc bulge with endplate osteophytes. No canal or foraminal stenosis. C7-T1: Endplate osteophytes. No canal or right foraminal stenosis. Minor left foraminal stenosis. MRI THORACIC SPINE Motion artifact is present. Alignment: Preserved. Vertebrae: Multilevel degenerative endplate irregularity. Chronic appearing degenerative endplate marrow changes, particularly at T5-T6 to T7-T8. No substantial marrow edema. No suspicious osseous lesion. Cord:  No abnormal signal within above limitation. Paraspinal and other soft tissues: Unremarkable. Disc levels: Mild multilevel degenerative disc disease with minor disc bulges or protrusions. Mild facet arthropathy. No high-grade degenerative stenosis. MRI LUMBAR SPINE Motion artifact is present particularly on axial imaging. Segmentation: Standard. Alignment:  Trace retrolisthesis at L3-L4. Vertebrae: Vertebral body heights are maintained apart from small Schmorl's nodes at the superior and inferior L3 on plates with minor associated marrow edema. No suspicious osseous lesion. Conus medullaris and cauda equina: Conus extends to the T12-L1 level. Conus and cauda equina appear normal. Paraspinal and other soft tissues: Atrophy of the inferior left psoas. Disc levels: L1-L2:  No canal or foraminal stenosis. L2-L3:  No canal or foraminal stenosis. L3-L4: Disc bulge. No canal stenosis. No right foraminal stenosis. Minor left foraminal stenosis. L4-L5: Disc bulge with endplate osteophytic ridging eccentric to the left. Facet arthropathy. No canal or right foraminal stenosis. Mild to moderate left foraminal stenosis. L5-S1: Small right subarticular disc protrusion. Facet arthropathy. No canal stenosis. Slight  effacement of the right subarticular recess. No foraminal stenosis. IMPRESSION: No abnormal cord signal. No high-grade canal stenosis. No evidence of discitis/osteomyelitis or epidural collection. Multilevel degenerative changes as detailed above. Electronically Signed   By: Macy Mis M.D.   On: 12/08/2020 18:16   MR THORACIC SPINE WO CONTRAST  Result Date: 12/08/2020 CLINICAL DATA:  Worsening bilateral lower extremity weakness EXAM: MRI CERVICAL, THORACIC AND LUMBAR SPINE WITHOUT CONTRAST TECHNIQUE: Multiplanar and multiecho pulse sequences of the cervical spine, to include the craniocervical junction and cervicothoracic junction, and thoracic and lumbar spine, were obtained without intravenous contrast. COMPARISON:  None. FINDINGS: MRI CERVICAL SPINE Alignment: No significant listhesis. Vertebrae: Vertebral body heights are maintained. No substantial marrow edema. No suspicious osseous lesion. Cord: No abnormal signal. Posterior Fossa, vertebral arteries, paraspinal tissues: Unremarkable. Disc levels: C2-C3: Left facet hypertrophy. No canal or right foraminal stenosis. Moderate left foraminal stenosis. C3-C4: Disc bulge with endplate osteophytes. Left facet hypertrophy. No canal or right foraminal stenosis. Moderate to marked left foraminal stenosis. C4-C5: Disc bulge with endplate osteophytes. Right facet joint effusion. Left facet hypertrophy. No canal stenosis. No foraminal stenosis. C5-C6: Disc bulge with superimposed central disc protrusion and endplate osteophytes. Left facet hypertrophy. No canal or foraminal stenosis. C6-C7: Disc bulge with endplate osteophytes. No canal or foraminal stenosis. C7-T1: Endplate osteophytes. No canal or right foraminal stenosis. Minor left foraminal stenosis. MRI THORACIC SPINE Motion artifact is present. Alignment: Preserved. Vertebrae:  Multilevel degenerative endplate irregularity. Chronic appearing degenerative endplate marrow changes, particularly at T5-T6 to  T7-T8. No substantial marrow edema. No suspicious osseous lesion. Cord:  No abnormal signal within above limitation. Paraspinal and other soft tissues: Unremarkable. Disc levels: Mild multilevel degenerative disc disease with minor disc bulges or protrusions. Mild facet arthropathy. No high-grade degenerative stenosis. MRI LUMBAR SPINE Motion artifact is present particularly on axial imaging. Segmentation: Standard. Alignment:  Trace retrolisthesis at L3-L4. Vertebrae: Vertebral body heights are maintained apart from small Schmorl's nodes at the superior and inferior L3 on plates with minor associated marrow edema. No suspicious osseous lesion. Conus medullaris and cauda equina: Conus extends to the T12-L1 level. Conus and cauda equina appear normal. Paraspinal and other soft tissues: Atrophy of the inferior left psoas. Disc levels: L1-L2:  No canal or foraminal stenosis. L2-L3:  No canal or foraminal stenosis. L3-L4: Disc bulge. No canal stenosis. No right foraminal stenosis. Minor left foraminal stenosis. L4-L5: Disc bulge with endplate osteophytic ridging eccentric to the left. Facet arthropathy. No canal or right foraminal stenosis. Mild to moderate left foraminal stenosis. L5-S1: Small right subarticular disc protrusion. Facet arthropathy. No canal stenosis. Slight effacement of the right subarticular recess. No foraminal stenosis. IMPRESSION: No abnormal cord signal. No high-grade canal stenosis. No evidence of discitis/osteomyelitis or epidural collection. Multilevel degenerative changes as detailed above. Electronically Signed   By: Macy Mis M.D.   On: 12/08/2020 18:16   MR LUMBAR SPINE WO CONTRAST  Result Date: 12/08/2020 CLINICAL DATA:  Worsening bilateral lower extremity weakness EXAM: MRI CERVICAL, THORACIC AND LUMBAR SPINE WITHOUT CONTRAST TECHNIQUE: Multiplanar and multiecho pulse sequences of the cervical spine, to include the craniocervical junction and cervicothoracic junction, and thoracic  and lumbar spine, were obtained without intravenous contrast. COMPARISON:  None. FINDINGS: MRI CERVICAL SPINE Alignment: No significant listhesis. Vertebrae: Vertebral body heights are maintained. No substantial marrow edema. No suspicious osseous lesion. Cord: No abnormal signal. Posterior Fossa, vertebral arteries, paraspinal tissues: Unremarkable. Disc levels: C2-C3: Left facet hypertrophy. No canal or right foraminal stenosis. Moderate left foraminal stenosis. C3-C4: Disc bulge with endplate osteophytes. Left facet hypertrophy. No canal or right foraminal stenosis. Moderate to marked left foraminal stenosis. C4-C5: Disc bulge with endplate osteophytes. Right facet joint effusion. Left facet hypertrophy. No canal stenosis. No foraminal stenosis. C5-C6: Disc bulge with superimposed central disc protrusion and endplate osteophytes. Left facet hypertrophy. No canal or foraminal stenosis. C6-C7: Disc bulge with endplate osteophytes. No canal or foraminal stenosis. C7-T1: Endplate osteophytes. No canal or right foraminal stenosis. Minor left foraminal stenosis. MRI THORACIC SPINE Motion artifact is present. Alignment: Preserved. Vertebrae: Multilevel degenerative endplate irregularity. Chronic appearing degenerative endplate marrow changes, particularly at T5-T6 to T7-T8. No substantial marrow edema. No suspicious osseous lesion. Cord:  No abnormal signal within above limitation. Paraspinal and other soft tissues: Unremarkable. Disc levels: Mild multilevel degenerative disc disease with minor disc bulges or protrusions. Mild facet arthropathy. No high-grade degenerative stenosis. MRI LUMBAR SPINE Motion artifact is present particularly on axial imaging. Segmentation: Standard. Alignment:  Trace retrolisthesis at L3-L4. Vertebrae: Vertebral body heights are maintained apart from small Schmorl's nodes at the superior and inferior L3 on plates with minor associated marrow edema. No suspicious osseous lesion. Conus  medullaris and cauda equina: Conus extends to the T12-L1 level. Conus and cauda equina appear normal. Paraspinal and other soft tissues: Atrophy of the inferior left psoas. Disc levels: L1-L2:  No canal or foraminal stenosis. L2-L3:  No canal or foraminal stenosis. L3-L4: Disc bulge. No  canal stenosis. No right foraminal stenosis. Minor left foraminal stenosis. L4-L5: Disc bulge with endplate osteophytic ridging eccentric to the left. Facet arthropathy. No canal or right foraminal stenosis. Mild to moderate left foraminal stenosis. L5-S1: Small right subarticular disc protrusion. Facet arthropathy. No canal stenosis. Slight effacement of the right subarticular recess. No foraminal stenosis. IMPRESSION: No abnormal cord signal. No high-grade canal stenosis. No evidence of discitis/osteomyelitis or epidural collection. Multilevel degenerative changes as detailed above. Electronically Signed   By: Macy Mis M.D.   On: 12/08/2020 18:16   MR PELVIS WO CONTRAST  Result Date: 12/08/2020 CLINICAL DATA:  Nontraumatic lumbosacral plexopathy. Worsening ability to move the lower extremities with right lateral thigh pain. EXAM: MRI PELVIS WITHOUT CONTRAST TECHNIQUE: Multiplanar multisequence MR imaging of the pelvis was performed. No intravenous contrast was administered. COMPARISON:  Pelvic CT 09/15/2020.  Lumbar MRI 12/08/2020. FINDINGS: Bones/Joint/Cartilage The bony pelvis appears normal, without evidence of acute fracture, dislocation or femoral head avascular necrosis. The hip and sacroiliac joints appear normal without effusions, erosions or subchondral edema. Lower lumbar spine findings are dictated separately. Ligaments Not relevant for exam/indication. Muscles and Tendons There is mild generalized edema throughout the pelvic and proximal thigh musculature. No focal fluid collection or atrophy is seen. There is mild gluteus tendinosis bilaterally with associated mild trochanteric bursal fluid. The common  hamstring tendons are intact. Soft tissues There is generalized subcutaneous edema, especially in the low anterior pelvic wall and lateral thighs bilaterally. There is also edema throughout the internal pelvic fat. A small amount of free pelvic fluid is present. No evidence of neurovascular abnormality. IMPRESSION: 1. Nonspecific generalized soft tissue edema involving the internal and subcutaneous fat as well as the pelvic and proximal thigh musculature. The diffuse nature of this edema suggests anasarca. Correlate clinically. No focal fluid collection identified. 2. No acute osseous findings or significant arthropathic changes. 3. No evidence of neurovascular abnormality. Electronically Signed   By: Richardean Sale M.D.   On: 12/08/2020 20:24   Anti-infectives (From admission, onward)   Start     Dose/Rate Route Frequency Ordered Stop   12/08/20 1007  vancomycin (VANCOCIN) 1-5 GM/200ML-% IVPB       Note to Pharmacy: Judieth Keens  : cabinet override      12/08/20 1007 12/08/20 1102   12/06/20 1203  vancomycin (VANCOCIN) 1-5 GM/200ML-% IVPB       Note to Pharmacy: Wallace Cullens   : cabinet override      12/06/20 1203 12/06/20 1206   12/04/20 1030  vancomycin (VANCOREADY) IVPB 1000 mg/200 mL        1,000 mg 200 mL/hr over 60 Minutes Intravenous  Once 12/04/20 0930 12/04/20 1230   12/03/20 2300  vancomycin (VANCOREADY) IVPB 1000 mg/200 mL        1,000 mg 200 mL/hr over 60 Minutes Intravenous Every M-W-F (Hemodialysis) 12/03/20 2019 12/09/20 2359   12/03/20 1830  ceFEPIme (MAXIPIME) 1 g in sodium chloride 0.9 % 100 mL IVPB  Status:  Discontinued        1 g 200 mL/hr over 30 Minutes Intravenous Every 24 hours 12/03/20 1802 12/05/20 1630   12/03/20 1800  vancomycin (VANCOREADY) IVPB 1000 mg/200 mL  Status:  Discontinued        1,000 mg 200 mL/hr over 60 Minutes Intravenous  Once 12/03/20 1756 12/03/20 1801   12/03/20 1300  vancomycin (VANCOREADY) IVPB 1750 mg/350 mL        1,750 mg 175  mL/hr over 120 Minutes Intravenous  Once 12/03/20 1252 12/03/20 1706   12/03/20 1300  cefTRIAXone (ROCEPHIN) 2 g in sodium chloride 0.9 % 100 mL IVPB        2 g 200 mL/hr over 30 Minutes Intravenous  Once 12/03/20 1252 12/03/20 1345       Assessment/Plan Rash to right upper thigh -skin biopsy performed at bedside -dry dressing can be changed daily or band-aid daily over the biopsy site -no further surgical needs, specimen taken to pathology -we will sign off.  FEN - Per primary VTE - Per primary ID - Per primary  ESRD on HD M/W/F Hx HTN Hx dCHF Hx PAF on Eliquis Hx CAD on Plavix Hx of AICD Hx of PAD Hx of DM2  Henreitta Cea, [facility] 12/09/2020, 11:47 AM Please see Amion for pager number during day hours 7:00am-4:30pm

## 2020-12-09 NOTE — Progress Notes (Signed)
Tele notified of an abnormal rhythm, EKG ran and showed Wide QRS tachycardia block, possible anterolateral infarct, MD notified.   Chrisandra Carota, RN 12/09/2020 3:26 PM

## 2020-12-09 NOTE — Progress Notes (Signed)
Physical Therapy Treatment Patient Details Name: Kyle Walton MRN: [id number] DOB: 13-Mar-1956 Today's Date: 12/09/2020    History of Present Illness Pt is a 65 y.o. male who presented 5/27 with R leg cellulitis and sepsis. PMH: PAF, OSA, MI, ESRD on HD MWF, DM on insulin, s/p multiple toe amputations, morbid obesity, ESBL/VRE/MRSA and CAD.    PT Comments    Pt received in supine, agreeable to therapy session and with good participation and fair tolerance for bed mobility and transfer training. Pt performed sit<>stands from elevated bed and Stedy seat heights x2 trials and needing +3 maxA to reach upright posture, and needing up to +2 maxA to perform supine<>EOB transfers. Pt limited due to severe R thigh pain and decreased activity tolerance. Pt continues to benefit from PT services to progress toward functional mobility goals. Continue to recommend SNF.  Follow Up Recommendations  SNF;Supervision/Assistance - 24 hour     Equipment Recommendations  3in1 (PT);Wheelchair (measurements PT);Wheelchair cushion (measurements PT);Hospital bed;Other (comment) (mechanical lift, ramp (unsure if he needs to perform stairs?))    Recommendations for Other Services       Precautions / Restrictions Precautions Precautions: Fall;Other (comment) Precaution Comments: contact precautions Restrictions Weight Bearing Restrictions: No    Mobility  Bed Mobility Overal bed mobility: Needs Assistance Bed Mobility: Supine to Sit;Sit to Supine     Supine to sit: Max assist;+2 for physical assistance;+2 for safety/equipment;HOB elevated Sit to supine: Max assist;+2 for physical assistance;+2 for safety/equipment   General bed mobility comments: Max A x 2 to get to EOB, able to assist in advancing L LE but heavy assist needed for trunk and RLE due to thigh pain. Max A x 2 to get back to supine via helicopter method due to pain/fatigue.    Transfers Overall transfer level: Needs assistance Equipment  used: Ambulation equipment used Transfers: Sit to/from Stand Sit to Stand: Max assist;From elevated surface (+3)         General transfer comment: Max A + 3 for sit to stands in [place] (from elevated bed and then from stedy pads) with assist from NT in front and therapists on each side.  Ambulation/Gait             General Gait Details: UTA; pt unable to stand >10 seconds within [facility] and RLE too painful to weight shift   Stairs             Wheelchair Mobility    Modified Rankin (Stroke Patients Only)       Balance Overall balance assessment: Needs assistance Sitting-balance support: Bilateral upper extremity supported;Feet supported Sitting balance-Leahy Scale: Poor Sitting balance - Comments: reliant on UE support EOB, brief moment of fair sitting balance unsupported   Standing balance support: Bilateral upper extremity supported;During functional activity Standing balance-Leahy Scale: Zero Standing balance comment: in Stedy, difficulty achieving upright trunk/fully extending hips due to RLE pain/weakness                            Cognition Arousal/Alertness: Awake/alert Behavior During Therapy: WFL for tasks assessed/performed Overall Cognitive Status: Within Functional Limits for tasks assessed                                 General Comments: pt internally distracted due to pain but participatory as able      Exercises      General Comments General comments (  skin integrity, edema, etc.): BP stable in supine/seated postures, HR tachy to 120's bpm with exertion and SpO2 99% on RA at rest and WFL with exertion.      Pertinent Vitals/Pain Pain Assessment: 0-10 Pain Score: 10-Worst pain ever Pain Location: R lateral leg with movement, especially hip>knee Pain Descriptors / Indicators: Grimacing;Guarding;Moaning Pain Intervention(s): Limited activity within patient's tolerance;Monitored during session;Premedicated before  session;Repositioned (RN present and aware)           PT Goals (current goals can now be found in the care plan section) Acute Rehab PT Goals Patient Stated Goal: get strength back PT Goal Formulation: With patient Time For Goal Achievement: 12/19/20 Potential to Achieve Goals: Good Progress towards PT goals: Progressing toward goals (slow progress, pain limiting)    Frequency    Min 2X/week      PT Plan Current plan remains appropriate    Co-evaluation PT/OT/SLP Co-Evaluation/Treatment: Yes Reason for Co-Treatment: Complexity of the patient's impairments (multi-system involvement);For patient/therapist safety;To address functional/ADL transfers PT goals addressed during session: Mobility/safety with mobility;Balance;Proper use of DME OT goals addressed during session: ADL's and self-care      AM-PAC PT "6 Clicks" Mobility   Outcome Measure  Help needed turning from your back to your side while in a flat bed without using bedrails?: A Lot Help needed moving from lying on your back to sitting on the side of a flat bed without using bedrails?: Total Help needed moving to and from a bed to a chair (including a wheelchair)?: Total Help needed standing up from a chair using your arms (e.g., wheelchair or bedside chair)?: Total Help needed to walk in hospital room?: Total Help needed climbing 3-5 steps with a railing? : Total 6 Click Score: 7    End of Session Equipment Utilized During Treatment: Gait belt Activity Tolerance: Patient limited by pain Patient left: in bed;with call bell/phone within reach;with bed alarm set;with nursing/sitter in room;Other (comment) (R prevalon boot donned, heels floated) Nurse Communication: Mobility status;Need for lift equipment;Patient requests pain meds PT Visit Diagnosis: Muscle weakness (generalized) (M62.81);Difficulty in walking, not elsewhere classified (R26.2);Pain Pain - Right/Left: Right Pain - part of body: Leg     Time:  [id number] PT Time Calculation (min) (ACUTE ONLY): 31 min  Charges:  $Therapeutic Activity: 8-22 mins                     Kaesha Kirsch P., PTA Acute Rehabilitation Services Pager: [phone] Office: [place] 12/09/2020, 5:20 PM

## 2020-12-09 NOTE — Progress Notes (Addendum)
Kyle Walton Progress Note   Subjective:   Pt seen in room. MRI neg for spinal abscess.   Objective Vitals:   12/09/20 0009 12/09/20 0518 12/09/20 0529 12/09/20 0850  BP: 92/68  (!) 84/48 125/71  Pulse: (!) 122  (!) 121 (!) 118  Resp: '20  19 20  '$ Temp: 99.2 F (37.3 C) 98.4 F (36.9 C) 98.4 F (36.9 C) 98.6 F (37 C)  TempSrc: Oral Oral Oral Oral  SpO2: 99%  96% 100%  Weight:  118.9 kg    Height:       Physical Exam General: Well developed male, alert and in NAD Heart: Tachycardic, regular rhythm, no murmur auscultated Lungs: CTA bilaterally without wheezing, rhonchi or rales Abdomen: Soft, non-tender, non-distended, +BS Extremities: 1+ pretibial edema, R lateral thigh w/ worsening irregular area of skin reddening w/o drainage or nodularity, very tender to touch.  Dialysis Access:  L IJ TDC   OP HD: [facility] MWF 4h 450/800 117kg 2K/3Ca bath TDC Hep 4900 + 500u/hr - zemplar 3.5 ug tiw - aranesp 85 mg q wed  - home renal: norvasc 2.5 qd/ phoslo 2 ac tid/ metoprolol 25 bid  Assessment/Plan: 1. R hip / upper leg pain - MRI negative. 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 Will dc Ca binder and VDRA and start on Na thiosulfate w/ HD MWF.  Bx was done this am.  2. RLE Cellulitis - on abx per primary team (vancomycin). Bld cxs from 5/27 and 5/28 both NTD. WBC 10-15 range, afeb since admission.  3. ESRD - MWF HD. HD tomorrow.   2. HTN/ vol - BP's are soft. Off home norvasc and home metoprolol (for afib) is now dc'd. Got 4 L off yest w/ HD. 2kg up now, better. Midodrine 10 tid added here.  3. MBD ckd - as above, use renvela now. VDRA dc'd.  4. Pruritis- chronic, atarax on med list 5. Anemia ckd - last Hb 9.4, getting darbe 60 ug q wed here (6/01).  6. Atrial fib - per pmd, on home eliquis 2.5 bid and home amio. BB on hold.  7. CAD hx CABG/ PCI - on home imdur, plavix. BB on hold  Kelly Splinter,  MD 12/08/2020, 10:06 AM   Additional Objective Labs: Basic Metabolic Panel: Recent Labs  Lab 12/06/20 0042 12/06/20 0700 12/07/20 0056 12/08/20 0848  NA 134*  --  132* 132*  K 4.7  --  4.0 4.0  CL 98  --  95* 97*  CO2 22  --  24 24  GLUCOSE 196*  --  127* 114*  BUN 54*  --  30* 42*  CREATININE 8.64*  --  5.56* 6.65*  CALCIUM 8.3*  --  8.1* 8.5*  PHOS  --  7.9* 5.1* 5.9*   Liver Function Tests: Recent Labs  Lab 12/03/20 1311 12/05/20 0721 12/06/20 0042 12/07/20 0056 12/08/20 0848  AST 38  --  44*  --   --   ALT 12  --  15  --   --   ALKPHOS 77  --  66  --   --   BILITOT 1.3*  --  1.0  --   --   PROT 6.8  --  5.7*  --   --   ALBUMIN 2.9*   < > 2.3* 2.5* 2.4*   < > = values in this interval not displayed.   No results for input(s): LIPASE, AMYLASE in the last 168 hours.  CBC: Recent Labs  Lab 12/03/20 1311 12/04/20 0412 12/05/20 0721 12/06/20 0042 12/07/20 0056 12/08/20 0847  WBC 18.3* 16.2* 17.3* 15.3* 16.0* 15.8*  NEUTROABS 15.8*  --   --   --   --   --   HGB 10.9* 10.2* 9.8* 9.4* 10.2* 10.1*  HCT 33.9* 32.0* 30.9* 29.9* 31.6* 32.6*  MCV 95.0 94.1 93.9 94.0 92.7 94.5  PLT 148* 145* 133* 149* 156 197   Blood Culture    Component Value Date/Time   SDES BLOOD RIGHT HAND 12/04/2020 0412   SPECREQUEST  12/04/2020 0412    BOTTLES DRAWN AEROBIC AND ANAEROBIC Blood Culture results may not be optimal due to an inadequate volume of blood received in culture bottles   CULT  12/04/2020 0412    NO GROWTH 5 DAYS Performed at [facility], [place] [address] [place], [place] [address]    REPTSTATUS 12/09/2020 FINAL 12/04/2020 0412    Cardiac Enzymes: Recent Labs  Lab 12/08/20 1145  CKTOTAL 24*   CBG: Recent Labs  Lab 12/08/20 0610 12/08/20 1251 12/08/20 1809 12/08/20 2126 12/09/20 0613  GLUCAP 74 116* 116* 122* 87   Iron Studies: No results for input(s): IRON, TIBC, TRANSFERRIN, FERRITIN in the last 72 hours. '@lablastinr3'$ @ Studies/Results: MR  CERVICAL SPINE WO CONTRAST  Result Date: 12/08/2020 CLINICAL DATA:  Worsening bilateral lower extremity weakness EXAM: MRI CERVICAL, THORACIC AND LUMBAR SPINE WITHOUT CONTRAST TECHNIQUE: Multiplanar and multiecho pulse sequences of the cervical spine, to include the craniocervical junction and cervicothoracic junction, and thoracic and lumbar spine, were obtained without intravenous contrast. COMPARISON:  None. FINDINGS: MRI CERVICAL SPINE Alignment: No significant listhesis. Vertebrae: Vertebral body heights are maintained. No substantial marrow edema. No suspicious osseous lesion. Cord: No abnormal signal. Posterior Fossa, vertebral arteries, paraspinal tissues: Unremarkable. Disc levels: C2-C3: Left facet hypertrophy. No canal or right foraminal stenosis. Moderate left foraminal stenosis. C3-C4: Disc bulge with endplate osteophytes. Left facet hypertrophy. No canal or right foraminal stenosis. Moderate to marked left foraminal stenosis. C4-C5: Disc bulge with endplate osteophytes. Right facet joint effusion. Left facet hypertrophy. No canal stenosis. No foraminal stenosis. C5-C6: Disc bulge with superimposed central disc protrusion and endplate osteophytes. Left facet hypertrophy. No canal or foraminal stenosis. C6-C7: Disc bulge with endplate osteophytes. No canal or foraminal stenosis. C7-T1: Endplate osteophytes. No canal or right foraminal stenosis. Minor left foraminal stenosis. MRI THORACIC SPINE Motion artifact is present. Alignment: Preserved. Vertebrae: Multilevel degenerative endplate irregularity. Chronic appearing degenerative endplate marrow changes, particularly at T5-T6 to T7-T8. No substantial marrow edema. No suspicious osseous lesion. Cord:  No abnormal signal within above limitation. Paraspinal and other soft tissues: Unremarkable. Disc levels: Mild multilevel degenerative disc disease with minor disc bulges or protrusions. Mild facet arthropathy. No high-grade degenerative stenosis. MRI  LUMBAR SPINE Motion artifact is present particularly on axial imaging. Segmentation: Standard. Alignment:  Trace retrolisthesis at L3-L4. Vertebrae: Vertebral body heights are maintained apart from small Schmorl's nodes at the superior and inferior L3 on plates with minor associated marrow edema. No suspicious osseous lesion. Conus medullaris and cauda equina: Conus extends to the T12-L1 level. Conus and cauda equina appear normal. Paraspinal and other soft tissues: Atrophy of the inferior left psoas. Disc levels: L1-L2:  No canal or foraminal stenosis. L2-L3:  No canal or foraminal stenosis. L3-L4: Disc bulge. No canal stenosis. No right foraminal stenosis. Minor left foraminal stenosis. L4-L5: Disc bulge with endplate osteophytic ridging eccentric to the left. Facet arthropathy. No canal or right foraminal stenosis. Mild to  moderate left foraminal stenosis. L5-S1: Small right subarticular disc protrusion. Facet arthropathy. No canal stenosis. Slight effacement of the right subarticular recess. No foraminal stenosis. IMPRESSION: No abnormal cord signal. No high-grade canal stenosis. No evidence of discitis/osteomyelitis or epidural collection. Multilevel degenerative changes as detailed above. Electronically Signed   By: Macy Mis M.D.   On: 12/08/2020 18:16   MR THORACIC SPINE WO CONTRAST  Result Date: 12/08/2020 CLINICAL DATA:  Worsening bilateral lower extremity weakness EXAM: MRI CERVICAL, THORACIC AND LUMBAR SPINE WITHOUT CONTRAST TECHNIQUE: Multiplanar and multiecho pulse sequences of the cervical spine, to include the craniocervical junction and cervicothoracic junction, and thoracic and lumbar spine, were obtained without intravenous contrast. COMPARISON:  None. FINDINGS: MRI CERVICAL SPINE Alignment: No significant listhesis. Vertebrae: Vertebral body heights are maintained. No substantial marrow edema. No suspicious osseous lesion. Cord: No abnormal signal. Posterior Fossa, vertebral arteries,  paraspinal tissues: Unremarkable. Disc levels: C2-C3: Left facet hypertrophy. No canal or right foraminal stenosis. Moderate left foraminal stenosis. C3-C4: Disc bulge with endplate osteophytes. Left facet hypertrophy. No canal or right foraminal stenosis. Moderate to marked left foraminal stenosis. C4-C5: Disc bulge with endplate osteophytes. Right facet joint effusion. Left facet hypertrophy. No canal stenosis. No foraminal stenosis. C5-C6: Disc bulge with superimposed central disc protrusion and endplate osteophytes. Left facet hypertrophy. No canal or foraminal stenosis. C6-C7: Disc bulge with endplate osteophytes. No canal or foraminal stenosis. C7-T1: Endplate osteophytes. No canal or right foraminal stenosis. Minor left foraminal stenosis. MRI THORACIC SPINE Motion artifact is present. Alignment: Preserved. Vertebrae: Multilevel degenerative endplate irregularity. Chronic appearing degenerative endplate marrow changes, particularly at T5-T6 to T7-T8. No substantial marrow edema. No suspicious osseous lesion. Cord:  No abnormal signal within above limitation. Paraspinal and other soft tissues: Unremarkable. Disc levels: Mild multilevel degenerative disc disease with minor disc bulges or protrusions. Mild facet arthropathy. No high-grade degenerative stenosis. MRI LUMBAR SPINE Motion artifact is present particularly on axial imaging. Segmentation: Standard. Alignment:  Trace retrolisthesis at L3-L4. Vertebrae: Vertebral body heights are maintained apart from small Schmorl's nodes at the superior and inferior L3 on plates with minor associated marrow edema. No suspicious osseous lesion. Conus medullaris and cauda equina: Conus extends to the T12-L1 level. Conus and cauda equina appear normal. Paraspinal and other soft tissues: Atrophy of the inferior left psoas. Disc levels: L1-L2:  No canal or foraminal stenosis. L2-L3:  No canal or foraminal stenosis. L3-L4: Disc bulge. No canal stenosis. No right foraminal  stenosis. Minor left foraminal stenosis. L4-L5: Disc bulge with endplate osteophytic ridging eccentric to the left. Facet arthropathy. No canal or right foraminal stenosis. Mild to moderate left foraminal stenosis. L5-S1: Small right subarticular disc protrusion. Facet arthropathy. No canal stenosis. Slight effacement of the right subarticular recess. No foraminal stenosis. IMPRESSION: No abnormal cord signal. No high-grade canal stenosis. No evidence of discitis/osteomyelitis or epidural collection. Multilevel degenerative changes as detailed above. Electronically Signed   By: Macy Mis M.D.   On: 12/08/2020 18:16   MR LUMBAR SPINE WO CONTRAST  Result Date: 12/08/2020 CLINICAL DATA:  Worsening bilateral lower extremity weakness EXAM: MRI CERVICAL, THORACIC AND LUMBAR SPINE WITHOUT CONTRAST TECHNIQUE: Multiplanar and multiecho pulse sequences of the cervical spine, to include the craniocervical junction and cervicothoracic junction, and thoracic and lumbar spine, were obtained without intravenous contrast. COMPARISON:  None. FINDINGS: MRI CERVICAL SPINE Alignment: No significant listhesis. Vertebrae: Vertebral body heights are maintained. No substantial marrow edema. No suspicious osseous lesion. Cord: No abnormal signal. Posterior Fossa, vertebral arteries, paraspinal tissues: Unremarkable.  Disc levels: C2-C3: Left facet hypertrophy. No canal or right foraminal stenosis. Moderate left foraminal stenosis. C3-C4: Disc bulge with endplate osteophytes. Left facet hypertrophy. No canal or right foraminal stenosis. Moderate to marked left foraminal stenosis. C4-C5: Disc bulge with endplate osteophytes. Right facet joint effusion. Left facet hypertrophy. No canal stenosis. No foraminal stenosis. C5-C6: Disc bulge with superimposed central disc protrusion and endplate osteophytes. Left facet hypertrophy. No canal or foraminal stenosis. C6-C7: Disc bulge with endplate osteophytes. No canal or foraminal stenosis.  C7-T1: Endplate osteophytes. No canal or right foraminal stenosis. Minor left foraminal stenosis. MRI THORACIC SPINE Motion artifact is present. Alignment: Preserved. Vertebrae: Multilevel degenerative endplate irregularity. Chronic appearing degenerative endplate marrow changes, particularly at T5-T6 to T7-T8. No substantial marrow edema. No suspicious osseous lesion. Cord:  No abnormal signal within above limitation. Paraspinal and other soft tissues: Unremarkable. Disc levels: Mild multilevel degenerative disc disease with minor disc bulges or protrusions. Mild facet arthropathy. No high-grade degenerative stenosis. MRI LUMBAR SPINE Motion artifact is present particularly on axial imaging. Segmentation: Standard. Alignment:  Trace retrolisthesis at L3-L4. Vertebrae: Vertebral body heights are maintained apart from small Schmorl's nodes at the superior and inferior L3 on plates with minor associated marrow edema. No suspicious osseous lesion. Conus medullaris and cauda equina: Conus extends to the T12-L1 level. Conus and cauda equina appear normal. Paraspinal and other soft tissues: Atrophy of the inferior left psoas. Disc levels: L1-L2:  No canal or foraminal stenosis. L2-L3:  No canal or foraminal stenosis. L3-L4: Disc bulge. No canal stenosis. No right foraminal stenosis. Minor left foraminal stenosis. L4-L5: Disc bulge with endplate osteophytic ridging eccentric to the left. Facet arthropathy. No canal or right foraminal stenosis. Mild to moderate left foraminal stenosis. L5-S1: Small right subarticular disc protrusion. Facet arthropathy. No canal stenosis. Slight effacement of the right subarticular recess. No foraminal stenosis. IMPRESSION: No abnormal cord signal. No high-grade canal stenosis. No evidence of discitis/osteomyelitis or epidural collection. Multilevel degenerative changes as detailed above. Electronically Signed   By: Macy Mis M.D.   On: 12/08/2020 18:16   MR PELVIS WO  CONTRAST  Result Date: 12/08/2020 CLINICAL DATA:  Nontraumatic lumbosacral plexopathy. Worsening ability to move the lower extremities with right lateral thigh pain. EXAM: MRI PELVIS WITHOUT CONTRAST TECHNIQUE: Multiplanar multisequence MR imaging of the pelvis was performed. No intravenous contrast was administered. COMPARISON:  Pelvic CT 09/15/2020.  Lumbar MRI 12/08/2020. FINDINGS: Bones/Joint/Cartilage The bony pelvis appears normal, without evidence of acute fracture, dislocation or femoral head avascular necrosis. The hip and sacroiliac joints appear normal without effusions, erosions or subchondral edema. Lower lumbar spine findings are dictated separately. Ligaments Not relevant for exam/indication. Muscles and Tendons There is mild generalized edema throughout the pelvic and proximal thigh musculature. No focal fluid collection or atrophy is seen. There is mild gluteus tendinosis bilaterally with associated mild trochanteric bursal fluid. The common hamstring tendons are intact. Soft tissues There is generalized subcutaneous edema, especially in the low anterior pelvic wall and lateral thighs bilaterally. There is also edema throughout the internal pelvic fat. A small amount of free pelvic fluid is present. No evidence of neurovascular abnormality. IMPRESSION: 1. Nonspecific generalized soft tissue edema involving the internal and subcutaneous fat as well as the pelvic and proximal thigh musculature. The diffuse nature of this edema suggests anasarca. Correlate clinically. No focal fluid collection identified. 2. No acute osseous findings or significant arthropathic changes. 3. No evidence of neurovascular abnormality. Electronically Signed   By: Caryl Comes.D.  On: 12/08/2020 20:24   Medications: . vancomycin     . acetaminophen  1,000 mg Oral Q8H  . amiodarone  200 mg Oral Daily  . apixaban  2.5 mg Oral BID  . atorvastatin  40 mg Oral Daily  . calcium acetate  1,334 mg Oral TID WC  .  Chlorhexidine Gluconate Cloth  6 each Topical Q0600  . cholestyramine light  4 g Oral QID  . clopidogrel  75 mg Oral Daily  . darbepoetin (ARANESP) injection - DIALYSIS  60 mcg Intravenous Q Wed-HD  . doxercalciferol  2 mcg Intravenous Q M,W,F-HD  . insulin aspart  0-15 Units Subcutaneous TID WC  . insulin glargine  10 Units Subcutaneous QHS  . levothyroxine  12.5 mcg Oral QAC breakfast  . lidocaine   Topical TID  . midodrine  10 mg Oral TID WC  . pantoprazole  40 mg Oral Daily  . pregabalin  25 mg Oral Daily  . sodium bicarbonate  325 mg Oral BID

## 2020-12-09 NOTE — Progress Notes (Signed)
Patient having small loose stools. Patient c/o constipation and states that, "he feels like there is something there but it won't come out".   Patient asking for stool softener. Patient ulcer asking for muscle relaxer.

## 2020-12-09 NOTE — Progress Notes (Signed)
NEUROLOGY PROGRESS NOTE  Patient ID: Kyle Walton is a 65 year old male w/pmh of PAF, diabetes c/b multiple toe amputations, obesity, OSA who presents with right leg pain for which neurology was consulted.  Subjective: No acute events overnight, he states that he cant move his leg but while stating so attempts to move the left leg and is antigravity. He continues to have pain to his right anterolateral thigh. Also reports that he is constipated and thinks an enema would work to relieve his constipation.   Physical Exam  Constitutional: Ill appearing caucasian male sleeping in bed  Psych:  Cooperative with exam upon waking  Eyes: No scleral injection HENT: No oropharyngeal obstruction. Poor dentition MSK: Retains only the last 3 toes on the left foot, no toes on the right.  Cardiovascular: Mild sinus tachycardia  Respiratory: Unlabored respirations  GI: Soft.  Protuberant.  There is no tenderness.  Skin: Right lower extremity is bandaged.  Thigh in the area of pain remains indurated and red. 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    Neuro: Mental Status: Awakens easily to voice, oriented x4, follows commands Cranial Nerves: Pupils are equal and round. 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 Face is symmetric.  Motor: Normal bulk and tone. 5/5 strength throughout the upper extremities, continues to have stable mild asterixis compared to last examination.  Left: 4/5 with hip flexion, 5/5 otherwise thoughout Right: 2/5 on the hip flexion (pain limited), 4/5 knee extension/flexion (pain limited), and 5/5 plantar/dorsi flexion  Sensory: Sensation is symmetric to light touch and temperature in the arms and legs, reduced in a length dependent fashion.  Significant allodynia  to touch in the lateral thigh, without involvement of the medial thigh, extending down to the knee but not involving the entirety of the popliteal fossa.  No allodynia in the calf or shin Deep Tendon Reflexes: 2+ symmetric bilaterally to biceps, brachioradialis, patellar. 0 achilles and plantars are mute   Pertinent Labs/Diagnostics:  Basic Metabolic Panel: Recent Labs  Lab 12/04/20 0412 12/05/20 0721 12/06/20 0042 12/06/20 0700 12/07/20 0056 12/08/20 0848  NA 136 136 134*  --  132* 132*  K 4.2 4.6 4.7  --  4.0 4.0  CL 99 100 98  --  95* 97*  CO2 23 20* 22  --  24 24  GLUCOSE 142* 119* 196*  --  127* 114*  BUN 30* 43* 54*  --  30* 42*  CREATININE 6.10* 7.54* 8.64*  --  5.56* 6.65*  CALCIUM 8.3* 8.4* 8.3*  --  8.1* 8.5*  PHOS  --  6.9*  --  7.9* 5.1* 5.9*    CBC: Recent Labs  Lab 12/03/20 1311 12/04/20 0412 12/05/20 0721 12/06/20 0042 12/07/20 0056 12/08/20 0847  WBC 18.3* 16.2* 17.3* 15.3* 16.0* 15.8*  NEUTROABS 15.8*  --   --   --   --   --   HGB 10.9* 10.2* 9.8* 9.4* 10.2* 10.1*  HCT 33.9* 32.0* 30.9* 29.9* 31.6* 32.6*  MCV 95.0 94.1 93.9 94.0 92.7 94.5  PLT 148* 145* 133* 149* 156 197    CT Femur Right WO Contrast 12/05/20 1.  No acute osseous injury of the right femur. 2. Soft tissue edema and skin thickening along the lateral aspect of  the upper right thigh and circumferential subcutaneous edema throughout the remainder of the thigh extending into the lower leg.This likely reflects reactive edema secondary to fluid overload and less likely cellulitis. 3. No drainable fluid collections.  VAS [name] Lower Extremity Venous Right 12/05/20  No DVT RLE   MRI C-spine, T-spine and L-spine personally reviewed by attending physician  no acute spinal cord compression  MRI pelvis personally reviewed by attending physician Diffuse edema, right pelvis worse than left  Please see radiology reports for full details of examinations  Assessment:  Kyle Walton is a 65 year  old male w/pmh of obesity, diabetes,PAF, OSA, being treated for cellulitis whom neurology was consulted for right leg pain.    The severe pain he is complaining of remains restricted to the distribution of the lateral cutaneous femoral nerve, consistent with meralgia paresthetica, likely secondary to edema of the right lower extremity due to his cellulitis.This is supported by CT findings of edema in the area, as well as examination with edema and induration of the area.  To rule out both an epidural abscess and compressive abscess, MRI Pelvis and MRI Total Spine were obtained on 12/08/20. These studies were non revealing- MRI Total Spine showed no abnormal cord signal + no discitis or epidural collection, MRI Pelvis with no compressive abscess.   Recommendations: - Discontinue Lidocaine given it is not helping with pain control  - Continued management of ?cellulitis per primary team- per discussion with them today they do not think the area of induration/redness to his right thigh is cellulitis  - Consider ID consult for evaluation/treatment of ?cellulitis  - Outpatient neurology follow-up for EMG/nerve conduction study if symptoms persist greater than 3 weeks (mid June or later given symptom onset about 1 week ago); referral placed earlier this hospitalization - Please note Lyrica can contribute to asterixis, if this significantly worsens consider discontinuing this medication - Neurology will sign off at this time, please reconsult if new questions arise  Kyle Hinds, NP  [facility] 12/09/2020, 9:43 AM   Patient seen and discussed with attending physician   Attending Neurologist's note:  I personally saw this patient, gathering history, performing the neurologic examination, reviewing relevant labs, personally reviewing relevant imaging as above and formulated the assessment and plan, adding the note above for completeness and clarity to accurately reflect my thoughts  Lesleigh Noe MD-PhD [facility] [phone] Available 7 AM to 7 PM, outside these hours please contact Neurologist on call listed on [place] than 35 minutes were spent in the care of this patient today, over which more than 50% was at bedside performing examination as documented above which was quite challenging given patient's pain and mental status.  Additionally discussed case with nephrology and primary team as well as MRI technicians and dialysis bedside team

## 2020-12-09 NOTE — Progress Notes (Signed)
OT Cancellation Note  Patient Details Name: Kyle Walton MRN: [id number] DOB: 1955/09/15   Cancelled Treatment:    Reason Eval/Treat Not Completed: Other (comment) Pt underwent skin biopsy earlier today that is still actively bleeding. Checked with RN who recommended to hold therapy for 1-2 hours for bleeding to stop. Will follow-up again as time allows.   Layla Maw 12/09/2020, 1:05 PM

## 2020-12-09 NOTE — TOC Progression Note (Signed)
Transition of Care [facility]) - Progression Note    Patient Details  Name: Kyle Walton MRN: [id number] Date of Birth: [birth year]  Transition of Care [facility]) CM/SW [place], [place] Phone Number: 12/09/2020, 4:14 PM  Clinical Narrative:     CSW met with patient at bedside. Patient appeared to be tried and lethargic and not engaging in assessment. CSW spoke with patient's wife- she remains agreeable to SNF placement. She has no SNF preference but requested somewhere with "good care". CSW advised potential barriers - hx of behavior, need for dialysis and transport  and making sure insurance is in network. She states understanding.    CSW will provide bed offers once available. CSW will continue to follow and assist with discharge planning.  Thurmond Butts, MSW, LCSW Clinical Social Worker   Expected Discharge Plan: Skilled Nursing Facility Barriers to Discharge: Continued Medical Work up  Expected Discharge Plan and Services Expected Discharge Plan: [place]                                               Social Determinants of Health (SDOH) Interventions    Readmission Risk Interventions No flowsheet data found.

## 2020-12-09 NOTE — Progress Notes (Signed)
PROGRESS NOTE        PATIENT DETAILS Name: Kyle Walton Age: 65 y.o. Sex: male Date of Birth: [birth year] Admit Date: 12/03/2020 Admitting Physician Neena Rhymes, MD QP:[id number], No Pcp Per (Inactive)  Brief Narrative: Patient is a 65 y.o. male PAF, CAD s/p CABG, AICD implantation, PAD-s/p numerous toe amputations, HTN, ESRD on HD MWF-admitted for right leg soft tissue infection and right upper thigh pain.  See below for further details.  Significant events: 5/27>> admit for RLE pain/right leg cellulitis. 5/29>> lethargy/encephalopathy-significant improvement post Narcan  Significant studies: 5/27>> x-ray right foot: No evidence of fracture/focal bone lesions. 5/27>> chest x-ray: Mild pulmonary vascular congestion. 5/29>> RLE Doppler: No DVT 5/29>> CT right lower extremity: Soft tissue edema-no drainable fluid collection.   6/1>> MRI C-spine/T-spine/L-spine: No discitis/osteomyelitis/epidural collection. 6/1>> MRI pelvis: Soft tissue edema in pelvis/proximal thigh.  No focal fluid collection identified  Antimicrobial therapy: Vancomycin: 5/27>> 6/1 Cefepime: 5/27>>5/29  Microbiology data: 5/27>> Influenza/COVID PCR: Negative 5/27>> blood culture: No growth 5/28>> blood culture:No growth  Procedures : 6/12>> punch biopsy/skin biopsy of right lateral thigh by general surgery  Consults: Nephrology, neurology, general surgery  DVT Prophylaxis : apixaban (ELIQUIS) tablet 2.5 mg Start: 12/03/20 2200 apixaban (ELIQUIS) tablet 2.5 mg    Subjective: Right thigh pain continues.  His right lateral thigh skin area appears to be more erythematous compared to the past few days.  He did not have erythema when he first presented to the hospital.  Assessment/Plan: Sepsis due to RLE cellulitis: Sepsis physiology has improved-significant improvement in erythema around his RLE wounds.  Blood cultures are negative so far.  Completed a course of vancomycin.   See picture of right leg below.  Right lateral thigh pain with worsening erythema: Unclear etiology-imaging negative for abscess.  Dopplers negative for DVT.  He continues to have severe pain and erythema in the lateral aspect of his right leg (see pictures taken below).  Appreciate neurology input-some concern that this could be diabetic amyotrophy or meralgia paresthetica-however given his poor overall health/frailty-not felt to be a candidate for IVIG and other therapies.  Given that patient is ESRD-also concerned that this could be developing calciphylaxis-hence after discussion with nephrology-have consulted general surgery for a punch biopsy.  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  Management of this pain is challenging-patient developed toxic encephalopathy/lethargy after narcotics administration and required Narcan.  He has developed asterixis-and is no longer on Neurontin-we have him on low-dose of Lyrica.  Lower extremity weakness: Felt to be due to debility-acute illness-right lower extremity weakness is mostly from his pain.  MRI imaging of his entire spine negative for any structural lesions.  We will need continued PT/OT treatments.  Likely will require SNF on discharge.  Acute toxic encephalopathy: Occurred on 5/29-given 1 dose of Narcan.  Currently completely awake and alert.  Minimize narcotics as much as possible.    ESRD on HD MWF: Nephrology following.  HTN: BP on the softer/lower side-continue midodrine-continue low-dose metoprolol.    Chronic diastolic heart failure: Volume status relatively stable-diuresis with HD.  CAD-s/p CABG 2017: No anginal symptoms-on Plavix/statin/beta-blocker  History of [id number] amiodarone, metoprolol and Eliquis.  History of AICD implantation  History of PAD-s/p multiple toe amputations bilaterally  DM-2: CBG stable-however has had poor appetite-did not  take his Lantus for the  past few days-hence will decrease Lantus to 10 units daily-continue SSI and follow closely.    Recent Labs    12/08/20 2126 12/09/20 0613 12/09/20 1125  GLUCAP 122* 87 133*   Hypothyroidism: Continue Synthroid  OSA: Continue CPAP nightly  Legally blind  Morbid Obesity: Estimated body mass index is 37.61 kg/m as calculated from the following:   Height as of this encounter: '5\' 10"'$  (1.778 m).   Weight as of this encounter: 118.9 kg.    Diet: Diet Order            Diet renal/carb modified with fluid restriction Diet-HS Snack? Nothing; Fluid restriction: 1200 mL Fluid; Room service appropriate? Yes with Assist; Fluid consistency: Thin  Diet effective now                  Code Status: Full code   Family Communication: Delcie Roch (spouse)-[phone]-spoke over the phone on 6/2  Disposition Plan: Status is: Inpatient  Remains inpatient appropriate because:Inpatient level of care appropriate due to severity of illness   Dispo: The patient is from: Home              Anticipated d/c is to: Home              Patient currently is not medically stable to d/c.   Difficult to place patient No     Barriers to Discharge: RLE pain/cellulitis-on IV antibiotics.  Antimicrobial agents: Anti-infectives (From admission, onward)   Start     Dose/Rate Route Frequency Ordered Stop   12/08/20 1007  vancomycin (VANCOCIN) 1-5 GM/200ML-% IVPB       Note to Pharmacy: Judieth Keens  : cabinet override      12/08/20 1007 12/08/20 1102   12/06/20 1203  vancomycin (VANCOCIN) 1-5 GM/200ML-% IVPB       Note to Pharmacy: Wallace Cullens   : cabinet override      12/06/20 1203 12/06/20 1206   12/04/20 1030  vancomycin (VANCOREADY) IVPB 1000 mg/200 mL        1,000 mg 200 mL/hr over 60 Minutes Intravenous  Once 12/04/20 0930 12/04/20 1230   12/03/20 2300  vancomycin (VANCOREADY) IVPB 1000 mg/200 mL        1,000 mg 200 mL/hr over 60 Minutes Intravenous Every M-W-F  (Hemodialysis) 12/03/20 2019 12/09/20 2359   12/03/20 1830  ceFEPIme (MAXIPIME) 1 g in sodium chloride 0.9 % 100 mL IVPB  Status:  Discontinued        1 g 200 mL/hr over 30 Minutes Intravenous Every 24 hours 12/03/20 1802 12/05/20 1630   12/03/20 1800  vancomycin (VANCOREADY) IVPB 1000 mg/200 mL  Status:  Discontinued        1,000 mg 200 mL/hr over 60 Minutes Intravenous  Once 12/03/20 1756 12/03/20 1801   12/03/20 1300  vancomycin (VANCOREADY) IVPB 1750 mg/350 mL        1,750 mg 175 mL/hr over 120 Minutes Intravenous  Once 12/03/20 1252 12/03/20 1706   12/03/20 1300  cefTRIAXone (ROCEPHIN) 2 g in sodium chloride 0.9 % 100 mL IVPB        2 g 200 mL/hr over 30 Minutes Intravenous  Once 12/03/20 1252 12/03/20 1345       Time spent: 35 minutes-Greater than 50% of this time was spent in counseling, explanation of diagnosis, planning of further management, and coordination of care.  MEDICATIONS: Scheduled Meds: . acetaminophen  1,000 mg Oral Q8H  . amiodarone  200 mg Oral Daily  . apixaban  2.5 mg  Oral BID  . atorvastatin  40 mg Oral Daily  . Chlorhexidine Gluconate Cloth  6 each Topical Q0600  . cholestyramine light  4 g Oral QID  . clopidogrel  75 mg Oral Daily  . darbepoetin (ARANESP) injection - DIALYSIS  60 mcg Intravenous Q Wed-HD  . insulin aspart  0-15 Units Subcutaneous TID WC  . insulin glargine  10 Units Subcutaneous QHS  . levothyroxine  12.5 mcg Oral QAC breakfast  . [START ON 12/10/2020] lidocaine   Topical TID  . midodrine  10 mg Oral TID WC  . pantoprazole  40 mg Oral Daily  . polyethylene glycol  17 g Oral Daily  . pregabalin  25 mg Oral Daily  . sevelamer carbonate  1,600 mg Oral TID WC  . sodium bicarbonate  325 mg Oral BID   Continuous Infusions: . [START ON 12/10/2020] sodium thiosulfate infusion for calciphylaxis    . vancomycin     PRN Meds:.hydrOXYzine, naLOXone (NARCAN)  injection, nitroGLYCERIN, ondansetron (ZOFRAN) IV, senna   PHYSICAL EXAM: Vital  signs: Vitals:   12/09/20 0518 12/09/20 0529 12/09/20 0850 12/09/20 1130  BP:  (!) 84/48 125/71 122/80  Pulse:  (!) 121 (!) 118 (!) 120  Resp:  '19 20 20  '$ Temp: 98.4 F (36.9 C) 98.4 F (36.9 C) 98.6 F (37 C) 97.9 F (36.6 C)  TempSrc: Oral Oral Oral Oral  SpO2:  96% 100% 97%  Weight: 118.9 kg     Height:       Filed Weights   12/08/20 0813 12/08/20 1210 12/09/20 0518  Weight: 126.7 kg 122.5 kg 118.9 kg   Body mass index is 37.61 kg/m.   Gen Exam:Alert awake-not in any distress HEENT:atraumatic, normocephalic Chest: B/L clear to auscultation anteriorly CVS:S1S2 regular Abdomen:soft non tender, non distended Extremities: See pictures below Neurology: Non focal           I have personally reviewed following labs and imaging studies  LABORATORY DATA: CBC: Recent Labs  Lab 12/03/20 1311 12/04/20 0412 12/05/20 0721 12/06/20 0042 12/07/20 0056 12/08/20 0847  WBC 18.3* 16.2* 17.3* 15.3* 16.0* 15.8*  NEUTROABS 15.8*  --   --   --   --   --   HGB 10.9* 10.2* 9.8* 9.4* 10.2* 10.1*  HCT 33.9* 32.0* 30.9* 29.9* 31.6* 32.6*  MCV 95.0 94.1 93.9 94.0 92.7 94.5  PLT 148* 145* 133* 149* 156 [id number]    Basic Metabolic Panel: Recent Labs  Lab 12/04/20 0412 12/05/20 0721 12/06/20 0042 12/06/20 0700 12/07/20 0056 12/08/20 0848  NA 136 136 134*  --  132* 132*  K 4.2 4.6 4.7  --  4.0 4.0  CL 99 100 98  --  95* 97*  CO2 23 20* 22  --  24 24  GLUCOSE 142* 119* 196*  --  127* 114*  BUN 30* 43* 54*  --  30* 42*  CREATININE 6.10* 7.54* 8.64*  --  5.56* 6.65*  CALCIUM 8.3* 8.4* 8.3*  --  8.1* 8.5*  PHOS  --  6.9*  --  7.9* 5.1* 5.9*    GFR: Estimated Creatinine Clearance: 14.3 mL/min (A) (by C-G formula based on SCr of 6.65 mg/dL (H)).  Liver Function Tests: Recent Labs  Lab 12/03/20 1311 12/05/20 0721 12/06/20 0042 12/07/20 0056 12/08/20 0848  AST 38  --  44*  --   --   ALT 12  --  15  --   --   ALKPHOS 77  --  66  --   --  BILITOT 1.3*  --  1.0  --   --    PROT 6.8  --  5.7*  --   --   ALBUMIN 2.9* 2.4* 2.3* 2.5* 2.4*   No results for input(s): LIPASE, AMYLASE in the last 168 hours. Recent Labs  Lab 12/06/20 0042  AMMONIA 56*    Coagulation Profile: Recent Labs  Lab 12/03/20 1311 12/04/20 0412  INR 1.6* 1.5*    Cardiac Enzymes: Recent Labs  Lab 12/08/20 1145  CKTOTAL 24*    BNP (last 3 results) No results for input(s): PROBNP in the last 8760 hours.  Lipid Profile: No results for input(s): CHOL, HDL, LDLCALC, TRIG, CHOLHDL, LDLDIRECT in the last 72 hours.  Thyroid Function Tests: No results for input(s): TSH, T4TOTAL, FREET4, T3FREE, THYROIDAB in the last 72 hours.  Anemia Panel: No results for input(s): VITAMINB12, FOLATE, FERRITIN, TIBC, IRON, RETICCTPCT in the last 72 hours.  Urine analysis: No results found for: COLORURINE, APPEARANCEUR, LABSPEC, PHURINE, GLUCOSEU, HGBUR, BILIRUBINUR, KETONESUR, PROTEINUR, UROBILINOGEN, NITRITE, LEUKOCYTESUR  Sepsis Labs: Lactic Acid, Venous    Component Value Date/Time   LATICACIDVEN 1.9 12/03/2020 1444    MICROBIOLOGY: Recent Results (from the past 240 hour(s))  Culture, blood (Routine x 2)     Status: None   Collection Time: 12/03/20 12:32 PM   Specimen: BLOOD  Result Value Ref Range Status   Specimen Description BLOOD RIGHT ANTECUBITAL  Final   Special Requests   Final    BOTTLES DRAWN AEROBIC AND ANAEROBIC Blood Culture adequate volume   Culture   Final    NO GROWTH 5 DAYS Performed at [facility], [address] [place], [place] [address]    Report Status 12/08/2020 FINAL  Final  Resp Panel by RT-PCR (Flu A&B, Covid) Nasopharyngeal Swab     Status: None   Collection Time: 12/03/20 12:52 PM   Specimen: Nasopharyngeal Swab; Nasopharyngeal(NP) swabs in vial transport medium  Result Value Ref Range Status   SARS Coronavirus 2 by RT PCR NEGATIVE NEGATIVE Final    Comment: (NOTE) SARS-CoV-2 target nucleic acids are NOT DETECTED.  The SARS-CoV-2 RNA is  generally detectable in upper respiratory specimens during the acute phase of infection. The lowest concentration of SARS-CoV-2 viral copies this assay can detect is 138 copies/mL. A negative result does not preclude SARS-Cov-2 infection and should not be used as the sole basis for treatment or other patient management decisions. A negative result may occur with  improper specimen collection/handling, submission of specimen other than nasopharyngeal swab, presence of viral mutation(s) within the areas targeted by this assay, and inadequate number of viral copies(<138 copies/mL). A negative result must be combined with clinical observations, patient history, and epidemiological information. The expected result is Negative.  Fact Sheet for Patients:  EntrepreneurPulse.com.au  Fact Sheet for Healthcare Providers:  IncredibleEmployment.be  This test is no t yet approved or cleared by the [place] FDA and  has been authorized for detection and/or diagnosis of SARS-CoV-2 by FDA under an Emergency Use Authorization (EUA). This EUA will remain  in effect (meaning this test can be used) for the duration of the COVID-19 declaration under Section 564(b)(1) of the Act, 21 U.S.C.section 360bbb-3(b)(1), unless the authorization is terminated  or revoked sooner.       Influenza A by PCR NEGATIVE NEGATIVE Final   Influenza B by PCR NEGATIVE NEGATIVE Final    Comment: (NOTE) The Xpert Xpress SARS-CoV-2/FLU/RSV plus assay is intended as an aid in the diagnosis of influenza  from Nasopharyngeal swab specimens and should not be used as a sole basis for treatment. Nasal washings and aspirates are unacceptable for Xpert Xpress SARS-CoV-2/FLU/RSV testing.  Fact Sheet for Patients: EntrepreneurPulse.com.au  Fact Sheet for Healthcare Providers: IncredibleEmployment.be  This test is not yet approved or cleared by the [place] FDA and has been authorized for detection and/or diagnosis of SARS-CoV-2 by FDA under an Emergency Use Authorization (EUA). This EUA will remain in effect (meaning this test can be used) for the duration of the COVID-19 declaration under Section 564(b)(1) of the Act, 21 U.S.C. section 360bbb-3(b)(1), unless the authorization is terminated or revoked.  Performed at [facility], [place] [address] [place], [place] [address]   Culture, blood (Routine x 2)     Status: None   Collection Time: 12/04/20  4:12 AM   Specimen: BLOOD RIGHT HAND  Result Value Ref Range Status   Specimen Description BLOOD RIGHT HAND  Final   Special Requests   Final    BOTTLES DRAWN AEROBIC AND ANAEROBIC Blood Culture results may not be optimal due to an inadequate volume of blood received in culture bottles   Culture   Final    NO GROWTH 5 DAYS Performed at [facility], [place] [address], [place], [place] [address]    Report Status 12/09/2020 FINAL  Final    RADIOLOGY STUDIES/RESULTS: MR CERVICAL SPINE WO CONTRAST  Result Date: 12/08/2020 CLINICAL DATA:  Worsening bilateral lower extremity weakness EXAM: MRI CERVICAL, THORACIC AND LUMBAR SPINE WITHOUT CONTRAST TECHNIQUE: Multiplanar and multiecho pulse sequences of the cervical spine, to include the craniocervical junction and cervicothoracic junction, and thoracic and lumbar spine, were obtained without intravenous contrast. COMPARISON:  None. FINDINGS: MRI CERVICAL SPINE Alignment: No significant listhesis. Vertebrae: Vertebral body heights are maintained. No substantial marrow edema. No suspicious osseous lesion. Cord: No abnormal signal. Posterior Fossa, vertebral arteries, paraspinal tissues: Unremarkable. Disc levels: C2-C3: Left facet hypertrophy. No canal or right foraminal stenosis. Moderate left foraminal stenosis. C3-C4: Disc bulge with endplate osteophytes. Left facet hypertrophy. No canal or right foraminal stenosis. Moderate to marked left  foraminal stenosis. C4-C5: Disc bulge with endplate osteophytes. Right facet joint effusion. Left facet hypertrophy. No canal stenosis. No foraminal stenosis. C5-C6: Disc bulge with superimposed central disc protrusion and endplate osteophytes. Left facet hypertrophy. No canal or foraminal stenosis. C6-C7: Disc bulge with endplate osteophytes. No canal or foraminal stenosis. C7-T1: Endplate osteophytes. No canal or right foraminal stenosis. Minor left foraminal stenosis. MRI THORACIC SPINE Motion artifact is present. Alignment: Preserved. Vertebrae: Multilevel degenerative endplate irregularity. Chronic appearing degenerative endplate marrow changes, particularly at T5-T6 to T7-T8. No substantial marrow edema. No suspicious osseous lesion. Cord:  No abnormal signal within above limitation. Paraspinal and other soft tissues: Unremarkable. Disc levels: Mild multilevel degenerative disc disease with minor disc bulges or protrusions. Mild facet arthropathy. No high-grade degenerative stenosis. MRI LUMBAR SPINE Motion artifact is present particularly on axial imaging. Segmentation: Standard. Alignment:  Trace retrolisthesis at L3-L4. Vertebrae: Vertebral body heights are maintained apart from small Schmorl's nodes at the superior and inferior L3 on plates with minor associated marrow edema. No suspicious osseous lesion. Conus medullaris and cauda equina: Conus extends to the T12-L1 level. Conus and cauda equina appear normal. Paraspinal and other soft tissues: Atrophy of the inferior left psoas. Disc levels: L1-L2:  No canal or foraminal stenosis. L2-L3:  No canal or foraminal stenosis. L3-L4: Disc bulge. No canal stenosis. No right foraminal stenosis. Minor left foraminal stenosis. L4-L5:  Disc bulge with endplate osteophytic ridging eccentric to the left. Facet arthropathy. No canal or right foraminal stenosis. Mild to moderate left foraminal stenosis. L5-S1: Small right subarticular disc protrusion. Facet arthropathy.  No canal stenosis. Slight effacement of the right subarticular recess. No foraminal stenosis. IMPRESSION: No abnormal cord signal. No high-grade canal stenosis. No evidence of discitis/osteomyelitis or epidural collection. Multilevel degenerative changes as detailed above. Electronically Signed   By: Macy Mis M.D.   On: 12/08/2020 18:16   MR THORACIC SPINE WO CONTRAST  Result Date: 12/08/2020 CLINICAL DATA:  Worsening bilateral lower extremity weakness EXAM: MRI CERVICAL, THORACIC AND LUMBAR SPINE WITHOUT CONTRAST TECHNIQUE: Multiplanar and multiecho pulse sequences of the cervical spine, to include the craniocervical junction and cervicothoracic junction, and thoracic and lumbar spine, were obtained without intravenous contrast. COMPARISON:  None. FINDINGS: MRI CERVICAL SPINE Alignment: No significant listhesis. Vertebrae: Vertebral body heights are maintained. No substantial marrow edema. No suspicious osseous lesion. Cord: No abnormal signal. Posterior Fossa, vertebral arteries, paraspinal tissues: Unremarkable. Disc levels: C2-C3: Left facet hypertrophy. No canal or right foraminal stenosis. Moderate left foraminal stenosis. C3-C4: Disc bulge with endplate osteophytes. Left facet hypertrophy. No canal or right foraminal stenosis. Moderate to marked left foraminal stenosis. C4-C5: Disc bulge with endplate osteophytes. Right facet joint effusion. Left facet hypertrophy. No canal stenosis. No foraminal stenosis. C5-C6: Disc bulge with superimposed central disc protrusion and endplate osteophytes. Left facet hypertrophy. No canal or foraminal stenosis. C6-C7: Disc bulge with endplate osteophytes. No canal or foraminal stenosis. C7-T1: Endplate osteophytes. No canal or right foraminal stenosis. Minor left foraminal stenosis. MRI THORACIC SPINE Motion artifact is present. Alignment: Preserved. Vertebrae: Multilevel degenerative endplate irregularity. Chronic appearing degenerative endplate marrow changes,  particularly at T5-T6 to T7-T8. No substantial marrow edema. No suspicious osseous lesion. Cord:  No abnormal signal within above limitation. Paraspinal and other soft tissues: Unremarkable. Disc levels: Mild multilevel degenerative disc disease with minor disc bulges or protrusions. Mild facet arthropathy. No high-grade degenerative stenosis. MRI LUMBAR SPINE Motion artifact is present particularly on axial imaging. Segmentation: Standard. Alignment:  Trace retrolisthesis at L3-L4. Vertebrae: Vertebral body heights are maintained apart from small Schmorl's nodes at the superior and inferior L3 on plates with minor associated marrow edema. No suspicious osseous lesion. Conus medullaris and cauda equina: Conus extends to the T12-L1 level. Conus and cauda equina appear normal. Paraspinal and other soft tissues: Atrophy of the inferior left psoas. Disc levels: L1-L2:  No canal or foraminal stenosis. L2-L3:  No canal or foraminal stenosis. L3-L4: Disc bulge. No canal stenosis. No right foraminal stenosis. Minor left foraminal stenosis. L4-L5: Disc bulge with endplate osteophytic ridging eccentric to the left. Facet arthropathy. No canal or right foraminal stenosis. Mild to moderate left foraminal stenosis. L5-S1: Small right subarticular disc protrusion. Facet arthropathy. No canal stenosis. Slight effacement of the right subarticular recess. No foraminal stenosis. IMPRESSION: No abnormal cord signal. No high-grade canal stenosis. No evidence of discitis/osteomyelitis or epidural collection. Multilevel degenerative changes as detailed above. Electronically Signed   By: Macy Mis M.D.   On: 12/08/2020 18:16   MR LUMBAR SPINE WO CONTRAST  Result Date: 12/08/2020 CLINICAL DATA:  Worsening bilateral lower extremity weakness EXAM: MRI CERVICAL, THORACIC AND LUMBAR SPINE WITHOUT CONTRAST TECHNIQUE: Multiplanar and multiecho pulse sequences of the cervical spine, to include the craniocervical junction and  cervicothoracic junction, and thoracic and lumbar spine, were obtained without intravenous contrast. COMPARISON:  None. FINDINGS: MRI CERVICAL SPINE Alignment: No significant listhesis. Vertebrae: Vertebral body heights  are maintained. No substantial marrow edema. No suspicious osseous lesion. Cord: No abnormal signal. Posterior Fossa, vertebral arteries, paraspinal tissues: Unremarkable. Disc levels: C2-C3: Left facet hypertrophy. No canal or right foraminal stenosis. Moderate left foraminal stenosis. C3-C4: Disc bulge with endplate osteophytes. Left facet hypertrophy. No canal or right foraminal stenosis. Moderate to marked left foraminal stenosis. C4-C5: Disc bulge with endplate osteophytes. Right facet joint effusion. Left facet hypertrophy. No canal stenosis. No foraminal stenosis. C5-C6: Disc bulge with superimposed central disc protrusion and endplate osteophytes. Left facet hypertrophy. No canal or foraminal stenosis. C6-C7: Disc bulge with endplate osteophytes. No canal or foraminal stenosis. C7-T1: Endplate osteophytes. No canal or right foraminal stenosis. Minor left foraminal stenosis. MRI THORACIC SPINE Motion artifact is present. Alignment: Preserved. Vertebrae: Multilevel degenerative endplate irregularity. Chronic appearing degenerative endplate marrow changes, particularly at T5-T6 to T7-T8. No substantial marrow edema. No suspicious osseous lesion. Cord:  No abnormal signal within above limitation. Paraspinal and other soft tissues: Unremarkable. Disc levels: Mild multilevel degenerative disc disease with minor disc bulges or protrusions. Mild facet arthropathy. No high-grade degenerative stenosis. MRI LUMBAR SPINE Motion artifact is present particularly on axial imaging. Segmentation: Standard. Alignment:  Trace retrolisthesis at L3-L4. Vertebrae: Vertebral body heights are maintained apart from small Schmorl's nodes at the superior and inferior L3 on plates with minor associated marrow edema. No  suspicious osseous lesion. Conus medullaris and cauda equina: Conus extends to the T12-L1 level. Conus and cauda equina appear normal. Paraspinal and other soft tissues: Atrophy of the inferior left psoas. Disc levels: L1-L2:  No canal or foraminal stenosis. L2-L3:  No canal or foraminal stenosis. L3-L4: Disc bulge. No canal stenosis. No right foraminal stenosis. Minor left foraminal stenosis. L4-L5: Disc bulge with endplate osteophytic ridging eccentric to the left. Facet arthropathy. No canal or right foraminal stenosis. Mild to moderate left foraminal stenosis. L5-S1: Small right subarticular disc protrusion. Facet arthropathy. No canal stenosis. Slight effacement of the right subarticular recess. No foraminal stenosis. IMPRESSION: No abnormal cord signal. No high-grade canal stenosis. No evidence of discitis/osteomyelitis or epidural collection. Multilevel degenerative changes as detailed above. Electronically Signed   By: Macy Mis M.D.   On: 12/08/2020 18:16   MR PELVIS WO CONTRAST  Result Date: 12/08/2020 CLINICAL DATA:  Nontraumatic lumbosacral plexopathy. Worsening ability to move the lower extremities with right lateral thigh pain. EXAM: MRI PELVIS WITHOUT CONTRAST TECHNIQUE: Multiplanar multisequence MR imaging of the pelvis was performed. No intravenous contrast was administered. COMPARISON:  Pelvic CT 09/15/2020.  Lumbar MRI 12/08/2020. FINDINGS: Bones/Joint/Cartilage The bony pelvis appears normal, without evidence of acute fracture, dislocation or femoral head avascular necrosis. The hip and sacroiliac joints appear normal without effusions, erosions or subchondral edema. Lower lumbar spine findings are dictated separately. Ligaments Not relevant for exam/indication. Muscles and Tendons There is mild generalized edema throughout the pelvic and proximal thigh musculature. No focal fluid collection or atrophy is seen. There is mild gluteus tendinosis bilaterally with associated mild trochanteric  bursal fluid. The common hamstring tendons are intact. Soft tissues There is generalized subcutaneous edema, especially in the low anterior pelvic wall and lateral thighs bilaterally. There is also edema throughout the internal pelvic fat. A small amount of free pelvic fluid is present. No evidence of neurovascular abnormality. IMPRESSION: 1. Nonspecific generalized soft tissue edema involving the internal and subcutaneous fat as well as the pelvic and proximal thigh musculature. The diffuse nature of this edema suggests anasarca. Correlate clinically. No focal fluid collection identified. 2. No acute osseous findings  or significant arthropathic changes. 3. No evidence of neurovascular abnormality. Electronically Signed   By: Richardean Sale M.D.   On: 12/08/2020 20:24     LOS: 6 days   Oren Binet, MD  [facility]    To contact the attending provider between 7A-7P or the covering provider during after hours 7P-7A, please log into the web site [email or web address] and access using universal [place] password for that web site. If you do not have the password, please call the hospital operator.  12/09/2020, 2:28 PM

## 2020-12-10 DIAGNOSIS — N186 End stage renal disease: Secondary | ICD-10-CM | POA: Diagnosis not present

## 2020-12-10 DIAGNOSIS — L03115 Cellulitis of right lower limb: Secondary | ICD-10-CM | POA: Diagnosis not present

## 2020-12-10 DIAGNOSIS — E1151 Type 2 diabetes mellitus with diabetic peripheral angiopathy without gangrene: Secondary | ICD-10-CM | POA: Diagnosis not present

## 2020-12-10 DIAGNOSIS — I10 Essential (primary) hypertension: Secondary | ICD-10-CM | POA: Diagnosis not present

## 2020-12-10 LAB — GLUCOSE, CAPILLARY
Glucose-Capillary: 133 mg/dL — ABNORMAL HIGH (ref 70–99)
Glucose-Capillary: 144 mg/dL — ABNORMAL HIGH (ref 70–99)
Glucose-Capillary: 178 mg/dL — ABNORMAL HIGH (ref 70–99)
Glucose-Capillary: 184 mg/dL — ABNORMAL HIGH (ref 70–99)

## 2020-12-10 MED ORDER — BISACODYL 10 MG RE SUPP
10.0000 mg | Freq: Every day | RECTAL | Status: DC | PRN
  Administered 2020-12-10: 10 mg via RECTAL
  Filled 2020-12-10 (×2): qty 1

## 2020-12-10 NOTE — TOC Progression Note (Signed)
Transition of Care [facility]) - Progression Note    Patient Details  Name: Kyle Walton MRN: [id number] Date of Birth: 1955-07-22  Transition of Care [facility]) CM/SW [place], [place] Phone Number: 12/10/2020, 12:16 PM  Clinical Narrative:     CSW spoke with patient's wife - CSW advised of his only bed offer at this time is [facility]- she is hoping for somewhere closer to [place] because she does not drive and has to take [facility]- she expressed it cost her 80.00 when she comes and visit the patient at the hospital. CSW states understanding, but advised patient will need SNF that in in network w/patient's insurance and transportation to dialysis. She states understanding.   CSW spoke with [facility]-they have no available bed at this time- but will monitor,including plan will have to be in place for transport to dialysis.   Thurmond Butts, MSW, LCSW Clinical Social Worker   Expected Discharge Plan: Skilled Nursing Facility Barriers to Discharge: Continued Medical Work up  Expected Discharge Plan and Services Expected Discharge Plan: [place]                                               Social Determinants of Health (SDOH) Interventions    Readmission Risk Interventions No flowsheet data found.

## 2020-12-10 NOTE — Care Management Important Message (Signed)
Important Message  Patient Details  Name: Kyle Walton MRN: [id number] Date of Birth: 1956-05-12   Medicare Important Message Given:  Yes     [name] 12/10/2020, 10:01 AM

## 2020-12-10 NOTE — Progress Notes (Signed)
[facility] Progress Note   Subjective:   Pt seen in room. Pain a little better, moving RLE some which is new. No new/ c/o.   Objective Vitals:   12/10/20 0203 12/10/20 0519 12/10/20 0832 12/10/20 1307  BP:  103/74 (!) 103/42 108/79  Pulse: (!) 122 (!) 121 (!) 122 (!) 119  Resp: '20 17 18 20  '$ Temp:  99.5 F (37.5 C) 98.5 F (36.9 C) (!) 97.4 F (36.3 C)  TempSrc:  Oral Oral Oral  SpO2: 98% 98% 95% 94%  Weight:      Height:       Physical Exam General: Well developed male, alert and in NAD Heart: Tachycardic, regular rhythm, no murmur auscultated Lungs: CTA bilaterally without wheezing, rhonchi or rales Abdomen: Soft, non-tender, non-distended, +BS Extremities: 1+ pretibial edema, R lateral thigh w/ worsening irregular area of skin reddening w/o abscess, very tender to touch.  Dialysis Access:  L IJ TDC   OP HD: [facility] MWF 4h 450/800 117kg 2K/3Ca bath TDC Hep 4900 + 500u/hr - zemplar 3.5 ug tiw - aranesp 85 mg q wed  - home renal: norvasc 2.5 qd/ phoslo 2 ac tid/ metoprolol 25 bid  Assessment/Plan: 1. R hip / upper leg pain - MRI negative. 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 Have dc'd Ca binder and VDRA and started on Na thiosulfate w/ HD MWF.  Bx was done 6/02.  2. RLE Cellulitis - on abx per primary team (vancomycin). Bld cxs from 5/27 and 5/28 both NTD. Afeb since admission. Leg is better.  3. ESRD - MWF HD. HD today.  Use low Ca++ 2.0 bath if possible.  2. HTN/ vol - BP's are soft. Off home norvasc and home metoprolol (for afib) is now dc'd. Got 4 L off w/ last HD. 2kg up now, UF same w/ HD today. Midodrine 10 tid added here.  3. MBD ckd - as above, using renvela now. VDRA dc'd. Avoid any/ all Ca/vit D products. Low Ca++bath on HD.  4. Pruritis- chronic, atarax on med list 5. Anemia ckd - last Hb 9.4, getting darbe 60 ug q wed here (6/01).  6. Atrial fib - per pmd, on home eliquis  2.5 bid and home amio. BB on hold.  7. CAD hx CABG/ PCI - on home imdur, plavix. BB on hold  Kelly Splinter, MD 12/08/2020, 10:06 AM   Additional Objective Labs: Basic Metabolic Panel: Recent Labs  Lab 12/06/20 0042 12/06/20 0700 12/07/20 0056 12/08/20 0848  NA 134*  --  132* 132*  K 4.7  --  4.0 4.0  CL 98  --  95* 97*  CO2 22  --  24 24  GLUCOSE 196*  --  127* 114*  BUN 54*  --  30* 42*  CREATININE 8.64*  --  5.56* 6.65*  CALCIUM 8.3*  --  8.1* 8.5*  PHOS  --  7.9* 5.1* 5.9*   Liver Function Tests: Recent Labs  Lab 12/06/20 0042 12/07/20 0056 12/08/20 0848  AST 44*  --   --   ALT 15  --   --   ALKPHOS 66  --   --   BILITOT 1.0  --   --   PROT 5.7*  --   --   ALBUMIN 2.3* 2.5* 2.4*   No results for input(s): LIPASE, AMYLASE in the last 168 hours. CBC: Recent Labs  Lab 12/04/20 [id number] 12/05/20 [id number] 12/06/20 0042 12/07/20 0056 12/08/20 [id number]  WBC 16.2* 17.3* 15.3* 16.0* 15.8*  HGB 10.2* 9.8* 9.4* 10.2* 10.1*  HCT 32.0* 30.9* 29.9* 31.6* 32.6*  MCV 94.1 93.9 94.0 92.7 94.5  PLT 145* 133* 149* 156 197   Blood Culture    Component Value Date/Time   SDES BLOOD RIGHT HAND 12/04/2020 0412   SPECREQUEST  12/04/2020 0412    BOTTLES DRAWN AEROBIC AND ANAEROBIC Blood Culture results may not be optimal due to an inadequate volume of blood received in culture bottles   CULT  12/04/2020 0412    NO GROWTH 5 DAYS Performed at [facility] [address], [place], [place] [address]    REPTSTATUS 12/09/2020 FINAL 12/04/2020 0412    Cardiac Enzymes: Recent Labs  Lab 12/08/20 1145  CKTOTAL 24*   CBG: Recent Labs  Lab 12/09/20 1125 12/09/20 1649 12/09/20 2108 12/10/20 0608 12/10/20 1221  GLUCAP 133* 162* 142* 133* 144*   Iron Studies: No results for input(s): IRON, TIBC, TRANSFERRIN, FERRITIN in the last 72 hours. '@lablastinr3'$ @ Studies/Results: MR CERVICAL SPINE WO CONTRAST  Result Date: 12/08/2020 CLINICAL DATA:  Worsening bilateral lower extremity  weakness EXAM: MRI CERVICAL, THORACIC AND LUMBAR SPINE WITHOUT CONTRAST TECHNIQUE: Multiplanar and multiecho pulse sequences of the cervical spine, to include the craniocervical junction and cervicothoracic junction, and thoracic and lumbar spine, were obtained without intravenous contrast. COMPARISON:  None. FINDINGS: MRI CERVICAL SPINE Alignment: No significant listhesis. Vertebrae: Vertebral body heights are maintained. No substantial marrow edema. No suspicious osseous lesion. Cord: No abnormal signal. Posterior Fossa, vertebral arteries, paraspinal tissues: Unremarkable. Disc levels: C2-C3: Left facet hypertrophy. No canal or right foraminal stenosis. Moderate left foraminal stenosis. C3-C4: Disc bulge with endplate osteophytes. Left facet hypertrophy. No canal or right foraminal stenosis. Moderate to marked left foraminal stenosis. C4-C5: Disc bulge with endplate osteophytes. Right facet joint effusion. Left facet hypertrophy. No canal stenosis. No foraminal stenosis. C5-C6: Disc bulge with superimposed central disc protrusion and endplate osteophytes. Left facet hypertrophy. No canal or foraminal stenosis. C6-C7: Disc bulge with endplate osteophytes. No canal or foraminal stenosis. C7-T1: Endplate osteophytes. No canal or right foraminal stenosis. Minor left foraminal stenosis. MRI THORACIC SPINE Motion artifact is present. Alignment: Preserved. Vertebrae: Multilevel degenerative endplate irregularity. Chronic appearing degenerative endplate marrow changes, particularly at T5-T6 to T7-T8. No substantial marrow edema. No suspicious osseous lesion. Cord:  No abnormal signal within above limitation. Paraspinal and other soft tissues: Unremarkable. Disc levels: Mild multilevel degenerative disc disease with minor disc bulges or protrusions. Mild facet arthropathy. No high-grade degenerative stenosis. MRI LUMBAR SPINE Motion artifact is present particularly on axial imaging. Segmentation: Standard. Alignment:   Trace retrolisthesis at L3-L4. Vertebrae: Vertebral body heights are maintained apart from small Schmorl's nodes at the superior and inferior L3 on plates with minor associated marrow edema. No suspicious osseous lesion. Conus medullaris and cauda equina: Conus extends to the T12-L1 level. Conus and cauda equina appear normal. Paraspinal and other soft tissues: Atrophy of the inferior left psoas. Disc levels: L1-L2:  No canal or foraminal stenosis. L2-L3:  No canal or foraminal stenosis. L3-L4: Disc bulge. No canal stenosis. No right foraminal stenosis. Minor left foraminal stenosis. L4-L5: Disc bulge with endplate osteophytic ridging eccentric to the left. Facet arthropathy. No canal or right foraminal stenosis. Mild to moderate left foraminal stenosis. L5-S1: Small right subarticular disc protrusion. Facet arthropathy. No canal stenosis. Slight effacement of the right subarticular recess. No foraminal stenosis. IMPRESSION: No abnormal cord signal. No high-grade canal stenosis. No evidence of discitis/osteomyelitis or epidural collection.  Multilevel degenerative changes as detailed above. Electronically Signed   By: Macy Mis M.D.   On: 12/08/2020 18:16   MR THORACIC SPINE WO CONTRAST  Result Date: 12/08/2020 CLINICAL DATA:  Worsening bilateral lower extremity weakness EXAM: MRI CERVICAL, THORACIC AND LUMBAR SPINE WITHOUT CONTRAST TECHNIQUE: Multiplanar and multiecho pulse sequences of the cervical spine, to include the craniocervical junction and cervicothoracic junction, and thoracic and lumbar spine, were obtained without intravenous contrast. COMPARISON:  None. FINDINGS: MRI CERVICAL SPINE Alignment: No significant listhesis. Vertebrae: Vertebral body heights are maintained. No substantial marrow edema. No suspicious osseous lesion. Cord: No abnormal signal. Posterior Fossa, vertebral arteries, paraspinal tissues: Unremarkable. Disc levels: C2-C3: Left facet hypertrophy. No canal or right foraminal  stenosis. Moderate left foraminal stenosis. C3-C4: Disc bulge with endplate osteophytes. Left facet hypertrophy. No canal or right foraminal stenosis. Moderate to marked left foraminal stenosis. C4-C5: Disc bulge with endplate osteophytes. Right facet joint effusion. Left facet hypertrophy. No canal stenosis. No foraminal stenosis. C5-C6: Disc bulge with superimposed central disc protrusion and endplate osteophytes. Left facet hypertrophy. No canal or foraminal stenosis. C6-C7: Disc bulge with endplate osteophytes. No canal or foraminal stenosis. C7-T1: Endplate osteophytes. No canal or right foraminal stenosis. Minor left foraminal stenosis. MRI THORACIC SPINE Motion artifact is present. Alignment: Preserved. Vertebrae: Multilevel degenerative endplate irregularity. Chronic appearing degenerative endplate marrow changes, particularly at T5-T6 to T7-T8. No substantial marrow edema. No suspicious osseous lesion. Cord:  No abnormal signal within above limitation. Paraspinal and other soft tissues: Unremarkable. Disc levels: Mild multilevel degenerative disc disease with minor disc bulges or protrusions. Mild facet arthropathy. No high-grade degenerative stenosis. MRI LUMBAR SPINE Motion artifact is present particularly on axial imaging. Segmentation: Standard. Alignment:  Trace retrolisthesis at L3-L4. Vertebrae: Vertebral body heights are maintained apart from small Schmorl's nodes at the superior and inferior L3 on plates with minor associated marrow edema. No suspicious osseous lesion. Conus medullaris and cauda equina: Conus extends to the T12-L1 level. Conus and cauda equina appear normal. Paraspinal and other soft tissues: Atrophy of the inferior left psoas. Disc levels: L1-L2:  No canal or foraminal stenosis. L2-L3:  No canal or foraminal stenosis. L3-L4: Disc bulge. No canal stenosis. No right foraminal stenosis. Minor left foraminal stenosis. L4-L5: Disc bulge with endplate osteophytic ridging eccentric to  the left. Facet arthropathy. No canal or right foraminal stenosis. Mild to moderate left foraminal stenosis. L5-S1: Small right subarticular disc protrusion. Facet arthropathy. No canal stenosis. Slight effacement of the right subarticular recess. No foraminal stenosis. IMPRESSION: No abnormal cord signal. No high-grade canal stenosis. No evidence of discitis/osteomyelitis or epidural collection. Multilevel degenerative changes as detailed above. Electronically Signed   By: Macy Mis M.D.   On: 12/08/2020 18:16   MR LUMBAR SPINE WO CONTRAST  Result Date: 12/08/2020 CLINICAL DATA:  Worsening bilateral lower extremity weakness EXAM: MRI CERVICAL, THORACIC AND LUMBAR SPINE WITHOUT CONTRAST TECHNIQUE: Multiplanar and multiecho pulse sequences of the cervical spine, to include the craniocervical junction and cervicothoracic junction, and thoracic and lumbar spine, were obtained without intravenous contrast. COMPARISON:  None. FINDINGS: MRI CERVICAL SPINE Alignment: No significant listhesis. Vertebrae: Vertebral body heights are maintained. No substantial marrow edema. No suspicious osseous lesion. Cord: No abnormal signal. Posterior Fossa, vertebral arteries, paraspinal tissues: Unremarkable. Disc levels: C2-C3: Left facet hypertrophy. No canal or right foraminal stenosis. Moderate left foraminal stenosis. C3-C4: Disc bulge with endplate osteophytes. Left facet hypertrophy. No canal or right foraminal stenosis. Moderate to marked left foraminal stenosis. C4-C5: Disc bulge with  endplate osteophytes. Right facet joint effusion. Left facet hypertrophy. No canal stenosis. No foraminal stenosis. C5-C6: Disc bulge with superimposed central disc protrusion and endplate osteophytes. Left facet hypertrophy. No canal or foraminal stenosis. C6-C7: Disc bulge with endplate osteophytes. No canal or foraminal stenosis. C7-T1: Endplate osteophytes. No canal or right foraminal stenosis. Minor left foraminal stenosis. MRI  THORACIC SPINE Motion artifact is present. Alignment: Preserved. Vertebrae: Multilevel degenerative endplate irregularity. Chronic appearing degenerative endplate marrow changes, particularly at T5-T6 to T7-T8. No substantial marrow edema. No suspicious osseous lesion. Cord:  No abnormal signal within above limitation. Paraspinal and other soft tissues: Unremarkable. Disc levels: Mild multilevel degenerative disc disease with minor disc bulges or protrusions. Mild facet arthropathy. No high-grade degenerative stenosis. MRI LUMBAR SPINE Motion artifact is present particularly on axial imaging. Segmentation: Standard. Alignment:  Trace retrolisthesis at L3-L4. Vertebrae: Vertebral body heights are maintained apart from small Schmorl's nodes at the superior and inferior L3 on plates with minor associated marrow edema. No suspicious osseous lesion. Conus medullaris and cauda equina: Conus extends to the T12-L1 level. Conus and cauda equina appear normal. Paraspinal and other soft tissues: Atrophy of the inferior left psoas. Disc levels: L1-L2:  No canal or foraminal stenosis. L2-L3:  No canal or foraminal stenosis. L3-L4: Disc bulge. No canal stenosis. No right foraminal stenosis. Minor left foraminal stenosis. L4-L5: Disc bulge with endplate osteophytic ridging eccentric to the left. Facet arthropathy. No canal or right foraminal stenosis. Mild to moderate left foraminal stenosis. L5-S1: Small right subarticular disc protrusion. Facet arthropathy. No canal stenosis. Slight effacement of the right subarticular recess. No foraminal stenosis. IMPRESSION: No abnormal cord signal. No high-grade canal stenosis. No evidence of discitis/osteomyelitis or epidural collection. Multilevel degenerative changes as detailed above. Electronically Signed   By: Macy Mis M.D.   On: 12/08/2020 18:16   MR PELVIS WO CONTRAST  Result Date: 12/08/2020 CLINICAL DATA:  Nontraumatic lumbosacral plexopathy. Worsening ability to move the  lower extremities with right lateral thigh pain. EXAM: MRI PELVIS WITHOUT CONTRAST TECHNIQUE: Multiplanar multisequence MR imaging of the pelvis was performed. No intravenous contrast was administered. COMPARISON:  Pelvic CT 09/15/2020.  Lumbar MRI 12/08/2020. FINDINGS: Bones/Joint/Cartilage The bony pelvis appears normal, without evidence of acute fracture, dislocation or femoral head avascular necrosis. The hip and sacroiliac joints appear normal without effusions, erosions or subchondral edema. Lower lumbar spine findings are dictated separately. Ligaments Not relevant for exam/indication. Muscles and Tendons There is mild generalized edema throughout the pelvic and proximal thigh musculature. No focal fluid collection or atrophy is seen. There is mild gluteus tendinosis bilaterally with associated mild trochanteric bursal fluid. The common hamstring tendons are intact. Soft tissues There is generalized subcutaneous edema, especially in the low anterior pelvic wall and lateral thighs bilaterally. There is also edema throughout the internal pelvic fat. A small amount of free pelvic fluid is present. No evidence of neurovascular abnormality. IMPRESSION: 1. Nonspecific generalized soft tissue edema involving the internal and subcutaneous fat as well as the pelvic and proximal thigh musculature. The diffuse nature of this edema suggests anasarca. Correlate clinically. No focal fluid collection identified. 2. No acute osseous findings or significant arthropathic changes. 3. No evidence of neurovascular abnormality. Electronically Signed   By: Richardean Sale M.D.   On: 12/08/2020 20:24   Medications: . sodium thiosulfate infusion for calciphylaxis     . acetaminophen  1,000 mg Oral Q8H  . amiodarone  200 mg Oral Daily  . apixaban  2.5 mg Oral BID  .  atorvastatin  40 mg Oral Daily  . Chlorhexidine Gluconate Cloth  6 each Topical Q0600  . cholestyramine light  4 g Oral QID  . clopidogrel  75 mg Oral Daily  .  darbepoetin (ARANESP) injection - DIALYSIS  60 mcg Intravenous Q Wed-HD  . insulin aspart  0-15 Units Subcutaneous TID WC  . insulin glargine  10 Units Subcutaneous QHS  . levothyroxine  12.5 mcg Oral QAC breakfast  . lidocaine   Topical TID  . midodrine  10 mg Oral TID WC  . pantoprazole  40 mg Oral Daily  . polyethylene glycol  17 g Oral Daily  . pregabalin  25 mg Oral Daily  . sevelamer carbonate  1,600 mg Oral TID WC  . sodium bicarbonate  325 mg Oral BID

## 2020-12-10 NOTE — Progress Notes (Addendum)
PROGRESS NOTE        PATIENT DETAILS Name: Kyle Walton Age: 65 y.o. Sex: male Date of Birth: [birth year] Admit Date: 12/03/2020 Admitting Physician Neena Rhymes, MD QP:[id number], No Pcp Per (Inactive)  Brief Narrative: Patient is a 65 y.o. male PAF, CAD s/p CABG, AICD implantation, PAD-s/p numerous toe amputations, HTN, ESRD on HD MWF-admitted for right leg soft tissue infection and right upper thigh pain.  See below for further details.  Significant events: 5/27>> admit for RLE pain/right leg cellulitis. 5/29>> lethargy/encephalopathy-significant improvement post Narcan 6/2>> skin biopsy-right thigh by general surgery  Significant studies: 5/27>> x-ray right foot: No evidence of fracture/focal bone lesions. 5/27>> chest x-ray: Mild pulmonary vascular congestion. 5/29>> RLE Doppler: No DVT 5/29>> CT right lower extremity: Soft tissue edema-no drainable fluid collection.   6/1>> MRI C-spine/T-spine/L-spine: No discitis/osteomyelitis/epidural collection. 6/1>> MRI pelvis: Soft tissue edema in pelvis/proximal thigh.  No focal fluid collection identified  Antimicrobial therapy: Vancomycin: 5/27>> 6/1 Cefepime: 5/27>>5/29  Microbiology data: 5/27>> Influenza/COVID PCR: Negative 5/27>> blood culture: No growth 5/28>> blood culture:No growth  Procedures : 6/2>> punch biopsy/skin biopsy of right lateral thigh by general surgery  Consults: Nephrology, neurology, general surgery  DVT Prophylaxis : apixaban (ELIQUIS) tablet 2.5 mg Start: 12/03/20 2200 apixaban (ELIQUIS) tablet 2.5 mg    Subjective: Awake/alert-pain in the right thigh appears unchanged.  Assessment/Plan: Sepsis due to RLE cellulitis: Sepsis physiology has improved-significant improvement in erythema around his RLE wounds.  Blood cultures are negative so far.  Completed a course of vancomycin.  See picture of right leg below.  Right lateral thigh pain with worsening erythema:  Unclear etiology-imaging negative for abscess.  Dopplers negative for DVT.  He continues to have severe pain-has developed erythema in the lateral aspect of his right thigh (in spite of being on IV vancomycin) over the past few days.  Not felt to be a sequelae of infection.  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   Initially-there was concern whether this was diabetic amyotrophy or meralgia paresthetica-and underwent neurology evaluation.  Continues to have significant pain-pain management has been challenging-as patient has developed encephalopathy/lethargy after narcotics requiring Narcan.  He has developed asterixis/encephalopathy-hence no longer on Neurontin.  He has had no response to Lidoderm patch-currently on low-dose Lyrica.  Lower extremity weakness: Felt to be due to debility-acute illness-right lower extremity weakness is mostly from his pain.  MRI imaging of his entire spine negative for any structural lesions.  No further recommendations from neurology.  We will need continued PT/OT treatments.  Likely will require SNF on discharge.  Acute toxic encephalopathy: Occurred on 5/29-given 1 dose of Narcan.  Currently completely awake and alert.  Avoid narcotics as much as possible.    ESRD on HD MWF: Nephrology following.  HTN: BP on the softer/lower side-continue midodrine-continue low-dose metoprolol.    Chronic diastolic heart failure: Volume status relatively stable-diuresis with HD.  CAD-s/p CABG 2017: No anginal symptoms-on Plavix/statin/beta-blocker  History of [id number] amiodarone, metoprolol and Eliquis.  History of AICD implantation  History of PAD-s/p multiple toe amputations bilaterally  DM-2: CBG stable-has poor appetite-hence plan is to allow also some permissive  hyperglycemia.  Lantus dosage has been decreased to 10  units daily-remains on SSI.  Follow and adjust   Recent Labs    12/09/20 1649 12/09/20 2108 12/10/20 0608  GLUCAP 162* 142* 133*   Hypothyroidism: Continue Synthroid  OSA: Continue CPAP nightly  Legally blind  Morbid Obesity: Estimated body mass index is 37.61 kg/m as calculated from the following:   Height as of this encounter: '5\' 10"'$  (1.778 m).   Weight as of this encounter: 118.9 kg.    Diet: Diet Order            Diet renal/carb modified with fluid restriction Diet-HS Snack? Nothing; Fluid restriction: 1200 mL Fluid; Room service appropriate? Yes with Assist; Fluid consistency: Thin  Diet effective now                  Code Status: Full code   Family Communication: Delcie Roch (spouse)-[phone]-spoke over the phone on 6/2  Disposition Plan: Status is: Inpatient  Remains inpatient appropriate because:Inpatient level of care appropriate due to severity of illness   Dispo: The patient is from: Home              Anticipated d/c is to: SNF              Patient currently is not medically stable to d/c.   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-on sodium thiosulfate  Antimicrobial agents: Anti-infectives (From admission, onward)   Start     Dose/Rate Route Frequency Ordered Stop   12/08/20 1007  vancomycin (VANCOCIN) 1-5 GM/200ML-% IVPB       Note to Pharmacy: Judieth Keens  : cabinet override      12/08/20 1007 12/08/20 1102   12/06/20 1203  vancomycin (VANCOCIN) 1-5 GM/200ML-% IVPB       Note to Pharmacy: Wallace Cullens   : cabinet override      12/06/20 1203 12/06/20 1206   12/04/20 1030  vancomycin (VANCOREADY) IVPB 1000 mg/200 mL        1,000 mg 200 mL/hr over 60 Minutes Intravenous  Once 12/04/20 0930 12/04/20 1230   12/03/20 2300  vancomycin (VANCOREADY) IVPB 1000 mg/200 mL        1,000 mg 200  mL/hr over 60 Minutes Intravenous Every M-W-F (Hemodialysis) 12/03/20 2019 12/09/20 2359   12/03/20 1830  ceFEPIme (MAXIPIME) 1 g in sodium chloride 0.9 % 100 mL IVPB  Status:  Discontinued        1 g 200 mL/hr over 30 Minutes Intravenous Every 24 hours 12/03/20 1802 12/05/20 1630   12/03/20 1800  vancomycin (VANCOREADY) IVPB 1000 mg/200 mL  Status:  Discontinued        1,000 mg 200 mL/hr over 60 Minutes Intravenous  Once 12/03/20 1756 12/03/20 1801   12/03/20 1300  vancomycin (VANCOREADY) IVPB 1750 mg/350 mL        1,750 mg 175 mL/hr over 120 Minutes Intravenous  Once 12/03/20 1252 12/03/20 1706   12/03/20 1300  cefTRIAXone (ROCEPHIN) 2 g in sodium chloride 0.9 % 100 mL IVPB        2 g 200 mL/hr over 30 Minutes Intravenous  Once 12/03/20 1252 12/03/20 1345       Time spent: 35 minutes-Greater than 50% of this time was spent in counseling, explanation of diagnosis, planning of further management, and coordination of care.  MEDICATIONS: Scheduled Meds: . acetaminophen  1,000 mg Oral Q8H  . amiodarone  200 mg Oral Daily  . apixaban  2.5 mg Oral  BID  . atorvastatin  40 mg Oral Daily  . Chlorhexidine Gluconate Cloth  6 each Topical Q0600  . cholestyramine light  4 g Oral QID  . clopidogrel  75 mg Oral Daily  . darbepoetin (ARANESP) injection - DIALYSIS  60 mcg Intravenous Q Wed-HD  . insulin aspart  0-15 Units Subcutaneous TID WC  . insulin glargine  10 Units Subcutaneous QHS  . levothyroxine  12.5 mcg Oral QAC breakfast  . lidocaine   Topical TID  . midodrine  10 mg Oral TID WC  . pantoprazole  40 mg Oral Daily  . polyethylene glycol  17 g Oral Daily  . pregabalin  25 mg Oral Daily  . sevelamer carbonate  1,600 mg Oral TID WC  . sodium bicarbonate  325 mg Oral BID   Continuous Infusions: . sodium thiosulfate infusion for calciphylaxis     PRN Meds:.hydrOXYzine, naLOXone (NARCAN)  injection, nitroGLYCERIN, ondansetron (ZOFRAN) IV, senna   PHYSICAL EXAM: Vital  signs: Vitals:   12/09/20 2343 12/10/20 0203 12/10/20 0519 12/10/20 0832  BP: 101/72  103/74 (!) 103/42  Pulse: (!) 123 (!) 122 (!) 121 (!) 122  Resp: '18 20 17 18  '$ Temp: 98.6 F (37 C)  99.5 F (37.5 C) 98.5 F (36.9 C)  TempSrc: Oral  Oral Oral  SpO2: 92% 98% 98% 95%  Weight:      Height:       Filed Weights   12/08/20 0813 12/08/20 1210 12/09/20 0518  Weight: 126.7 kg 122.5 kg 118.9 kg   Body mass index is 37.61 kg/m.   Gen Exam:Alert awake-not in any distress HEENT:atraumatic, normocephalic Chest: B/L clear to auscultation anteriorly CVS:S1S2 regular Abdomen:soft non tender, non distended Extremities: See pictures below-taken on 6/2-remains unchanged on 6/3.   Neurology: Non focal Skin: no rash           I have personally reviewed following labs and imaging studies  LABORATORY DATA: CBC: Recent Labs  Lab 12/03/20 1311 12/04/20 0412 12/05/20 0721 12/06/20 0042 12/07/20 0056 12/08/20 0847  WBC 18.3* 16.2* 17.3* 15.3* 16.0* 15.8*  NEUTROABS 15.8*  --   --   --   --   --   HGB 10.9* 10.2* 9.8* 9.4* 10.2* 10.1*  HCT 33.9* 32.0* 30.9* 29.9* 31.6* 32.6*  MCV 95.0 94.1 93.9 94.0 92.7 94.5  PLT 148* 145* 133* 149* 156 [id number]    Basic Metabolic Panel: Recent Labs  Lab 12/04/20 0412 12/05/20 0721 12/06/20 0042 12/06/20 0700 12/07/20 0056 12/08/20 0848  NA 136 136 134*  --  132* 132*  K 4.2 4.6 4.7  --  4.0 4.0  CL 99 100 98  --  95* 97*  CO2 23 20* 22  --  24 24  GLUCOSE 142* 119* 196*  --  127* 114*  BUN 30* 43* 54*  --  30* 42*  CREATININE 6.10* 7.54* 8.64*  --  5.56* 6.65*  CALCIUM 8.3* 8.4* 8.3*  --  8.1* 8.5*  PHOS  --  6.9*  --  7.9* 5.1* 5.9*    GFR: Estimated Creatinine Clearance: 14.3 mL/min (A) (by C-G formula based on SCr of 6.65 mg/dL (H)).  Liver Function Tests: Recent Labs  Lab 12/03/20 1311 12/05/20 0721 12/06/20 0042 12/07/20 0056 12/08/20 0848  AST 38  --  44*  --   --   ALT 12  --  15  --   --   ALKPHOS 77  --  66  --    --  BILITOT 1.3*  --  1.0  --   --   PROT 6.8  --  5.7*  --   --   ALBUMIN 2.9* 2.4* 2.3* 2.5* 2.4*   No results for input(s): LIPASE, AMYLASE in the last 168 hours. Recent Labs  Lab 12/06/20 0042  AMMONIA 56*    Coagulation Profile: Recent Labs  Lab 12/03/20 1311 12/04/20 0412  INR 1.6* 1.5*    Cardiac Enzymes: Recent Labs  Lab 12/08/20 1145  CKTOTAL 24*    BNP (last 3 results) No results for input(s): PROBNP in the last 8760 hours.  Lipid Profile: No results for input(s): CHOL, HDL, LDLCALC, TRIG, CHOLHDL, LDLDIRECT in the last 72 hours.  Thyroid Function Tests: No results for input(s): TSH, T4TOTAL, FREET4, T3FREE, THYROIDAB in the last 72 hours.  Anemia Panel: No results for input(s): VITAMINB12, FOLATE, FERRITIN, TIBC, IRON, RETICCTPCT in the last 72 hours.  Urine analysis: No results found for: COLORURINE, APPEARANCEUR, LABSPEC, PHURINE, GLUCOSEU, HGBUR, BILIRUBINUR, KETONESUR, PROTEINUR, UROBILINOGEN, NITRITE, LEUKOCYTESUR  Sepsis Labs: Lactic Acid, Venous    Component Value Date/Time   LATICACIDVEN 1.9 12/03/2020 1444    MICROBIOLOGY: Recent Results (from the past 240 hour(s))  Culture, blood (Routine x 2)     Status: None   Collection Time: 12/03/20 12:32 PM   Specimen: BLOOD  Result Value Ref Range Status   Specimen Description BLOOD RIGHT ANTECUBITAL  Final   Special Requests   Final    BOTTLES DRAWN AEROBIC AND ANAEROBIC Blood Culture adequate volume   Culture   Final    NO GROWTH 5 DAYS Performed at [facility], [address] [place], [place] [address]    Report Status 12/08/2020 FINAL  Final  Resp Panel by RT-PCR (Flu A&B, Covid) Nasopharyngeal Swab     Status: None   Collection Time: 12/03/20 12:52 PM   Specimen: Nasopharyngeal Swab; Nasopharyngeal(NP) swabs in vial transport medium  Result Value Ref Range Status   SARS Coronavirus 2 by RT PCR NEGATIVE NEGATIVE Final    Comment: (NOTE) SARS-CoV-2 target nucleic acids are  NOT DETECTED.  The SARS-CoV-2 RNA is generally detectable in upper respiratory specimens during the acute phase of infection. The lowest concentration of SARS-CoV-2 viral copies this assay can detect is 138 copies/mL. A negative result does not preclude SARS-Cov-2 infection and should not be used as the sole basis for treatment or other patient management decisions. A negative result may occur with  improper specimen collection/handling, submission of specimen other than nasopharyngeal swab, presence of viral mutation(s) within the areas targeted by this assay, and inadequate number of viral copies(<138 copies/mL). A negative result must be combined with clinical observations, patient history, and epidemiological information. The expected result is Negative.  Fact Sheet for Patients:  EntrepreneurPulse.com.au  Fact Sheet for Healthcare Providers:  IncredibleEmployment.be  This test is no t yet approved or cleared by the [place] FDA and  has been authorized for detection and/or diagnosis of SARS-CoV-2 by FDA under an Emergency Use Authorization (EUA). This EUA will remain  in effect (meaning this test can be used) for the duration of the COVID-19 declaration under Section 564(b)(1) of the Act, 21 U.S.C.section 360bbb-3(b)(1), unless the authorization is terminated  or revoked sooner.       Influenza A by PCR NEGATIVE NEGATIVE Final   Influenza B by PCR NEGATIVE NEGATIVE Final    Comment: (NOTE) The Xpert Xpress SARS-CoV-2/FLU/RSV plus assay is intended as an aid in the diagnosis of influenza from  Nasopharyngeal swab specimens and should not be used as a sole basis for treatment. Nasal washings and aspirates are unacceptable for Xpert Xpress SARS-CoV-2/FLU/RSV testing.  Fact Sheet for Patients: EntrepreneurPulse.com.au  Fact Sheet for Healthcare Providers: IncredibleEmployment.be  This test is not  yet approved or cleared by the [place] FDA and has been authorized for detection and/or diagnosis of SARS-CoV-2 by FDA under an Emergency Use Authorization (EUA). This EUA will remain in effect (meaning this test can be used) for the duration of the COVID-19 declaration under Section 564(b)(1) of the Act, 21 U.S.C. section 360bbb-3(b)(1), unless the authorization is terminated or revoked.  Performed at [facility], [place] [address] [place], [place] [address]   Culture, blood (Routine x 2)     Status: None   Collection Time: 12/04/20  4:12 AM   Specimen: BLOOD RIGHT HAND  Result Value Ref Range Status   Specimen Description BLOOD RIGHT HAND  Final   Special Requests   Final    BOTTLES DRAWN AEROBIC AND ANAEROBIC Blood Culture results may not be optimal due to an inadequate volume of blood received in culture bottles   Culture   Final    NO GROWTH 5 DAYS Performed at [facility], [place] [address], [place], [place] [address]    Report Status 12/09/2020 FINAL  Final    RADIOLOGY STUDIES/RESULTS: MR CERVICAL SPINE WO CONTRAST  Result Date: 12/08/2020 CLINICAL DATA:  Worsening bilateral lower extremity weakness EXAM: MRI CERVICAL, THORACIC AND LUMBAR SPINE WITHOUT CONTRAST TECHNIQUE: Multiplanar and multiecho pulse sequences of the cervical spine, to include the craniocervical junction and cervicothoracic junction, and thoracic and lumbar spine, were obtained without intravenous contrast. COMPARISON:  None. FINDINGS: MRI CERVICAL SPINE Alignment: No significant listhesis. Vertebrae: Vertebral body heights are maintained. No substantial marrow edema. No suspicious osseous lesion. Cord: No abnormal signal. Posterior Fossa, vertebral arteries, paraspinal tissues: Unremarkable. Disc levels: C2-C3: Left facet hypertrophy. No canal or right foraminal stenosis. Moderate left foraminal stenosis. C3-C4: Disc bulge with endplate osteophytes. Left facet hypertrophy. No canal or right  foraminal stenosis. Moderate to marked left foraminal stenosis. C4-C5: Disc bulge with endplate osteophytes. Right facet joint effusion. Left facet hypertrophy. No canal stenosis. No foraminal stenosis. C5-C6: Disc bulge with superimposed central disc protrusion and endplate osteophytes. Left facet hypertrophy. No canal or foraminal stenosis. C6-C7: Disc bulge with endplate osteophytes. No canal or foraminal stenosis. C7-T1: Endplate osteophytes. No canal or right foraminal stenosis. Minor left foraminal stenosis. MRI THORACIC SPINE Motion artifact is present. Alignment: Preserved. Vertebrae: Multilevel degenerative endplate irregularity. Chronic appearing degenerative endplate marrow changes, particularly at T5-T6 to T7-T8. No substantial marrow edema. No suspicious osseous lesion. Cord:  No abnormal signal within above limitation. Paraspinal and other soft tissues: Unremarkable. Disc levels: Mild multilevel degenerative disc disease with minor disc bulges or protrusions. Mild facet arthropathy. No high-grade degenerative stenosis. MRI LUMBAR SPINE Motion artifact is present particularly on axial imaging. Segmentation: Standard. Alignment:  Trace retrolisthesis at L3-L4. Vertebrae: Vertebral body heights are maintained apart from small Schmorl's nodes at the superior and inferior L3 on plates with minor associated marrow edema. No suspicious osseous lesion. Conus medullaris and cauda equina: Conus extends to the T12-L1 level. Conus and cauda equina appear normal. Paraspinal and other soft tissues: Atrophy of the inferior left psoas. Disc levels: L1-L2:  No canal or foraminal stenosis. L2-L3:  No canal or foraminal stenosis. L3-L4: Disc bulge. No canal stenosis. No right foraminal stenosis. Minor left foraminal stenosis. L4-L5: Disc  bulge with endplate osteophytic ridging eccentric to the left. Facet arthropathy. No canal or right foraminal stenosis. Mild to moderate left foraminal stenosis. L5-S1: Small right  subarticular disc protrusion. Facet arthropathy. No canal stenosis. Slight effacement of the right subarticular recess. No foraminal stenosis. IMPRESSION: No abnormal cord signal. No high-grade canal stenosis. No evidence of discitis/osteomyelitis or epidural collection. Multilevel degenerative changes as detailed above. Electronically Signed   By: Macy Mis M.D.   On: 12/08/2020 18:16   MR THORACIC SPINE WO CONTRAST  Result Date: 12/08/2020 CLINICAL DATA:  Worsening bilateral lower extremity weakness EXAM: MRI CERVICAL, THORACIC AND LUMBAR SPINE WITHOUT CONTRAST TECHNIQUE: Multiplanar and multiecho pulse sequences of the cervical spine, to include the craniocervical junction and cervicothoracic junction, and thoracic and lumbar spine, were obtained without intravenous contrast. COMPARISON:  None. FINDINGS: MRI CERVICAL SPINE Alignment: No significant listhesis. Vertebrae: Vertebral body heights are maintained. No substantial marrow edema. No suspicious osseous lesion. Cord: No abnormal signal. Posterior Fossa, vertebral arteries, paraspinal tissues: Unremarkable. Disc levels: C2-C3: Left facet hypertrophy. No canal or right foraminal stenosis. Moderate left foraminal stenosis. C3-C4: Disc bulge with endplate osteophytes. Left facet hypertrophy. No canal or right foraminal stenosis. Moderate to marked left foraminal stenosis. C4-C5: Disc bulge with endplate osteophytes. Right facet joint effusion. Left facet hypertrophy. No canal stenosis. No foraminal stenosis. C5-C6: Disc bulge with superimposed central disc protrusion and endplate osteophytes. Left facet hypertrophy. No canal or foraminal stenosis. C6-C7: Disc bulge with endplate osteophytes. No canal or foraminal stenosis. C7-T1: Endplate osteophytes. No canal or right foraminal stenosis. Minor left foraminal stenosis. MRI THORACIC SPINE Motion artifact is present. Alignment: Preserved. Vertebrae: Multilevel degenerative endplate irregularity. Chronic  appearing degenerative endplate marrow changes, particularly at T5-T6 to T7-T8. No substantial marrow edema. No suspicious osseous lesion. Cord:  No abnormal signal within above limitation. Paraspinal and other soft tissues: Unremarkable. Disc levels: Mild multilevel degenerative disc disease with minor disc bulges or protrusions. Mild facet arthropathy. No high-grade degenerative stenosis. MRI LUMBAR SPINE Motion artifact is present particularly on axial imaging. Segmentation: Standard. Alignment:  Trace retrolisthesis at L3-L4. Vertebrae: Vertebral body heights are maintained apart from small Schmorl's nodes at the superior and inferior L3 on plates with minor associated marrow edema. No suspicious osseous lesion. Conus medullaris and cauda equina: Conus extends to the T12-L1 level. Conus and cauda equina appear normal. Paraspinal and other soft tissues: Atrophy of the inferior left psoas. Disc levels: L1-L2:  No canal or foraminal stenosis. L2-L3:  No canal or foraminal stenosis. L3-L4: Disc bulge. No canal stenosis. No right foraminal stenosis. Minor left foraminal stenosis. L4-L5: Disc bulge with endplate osteophytic ridging eccentric to the left. Facet arthropathy. No canal or right foraminal stenosis. Mild to moderate left foraminal stenosis. L5-S1: Small right subarticular disc protrusion. Facet arthropathy. No canal stenosis. Slight effacement of the right subarticular recess. No foraminal stenosis. IMPRESSION: No abnormal cord signal. No high-grade canal stenosis. No evidence of discitis/osteomyelitis or epidural collection. Multilevel degenerative changes as detailed above. Electronically Signed   By: Macy Mis M.D.   On: 12/08/2020 18:16   MR LUMBAR SPINE WO CONTRAST  Result Date: 12/08/2020 CLINICAL DATA:  Worsening bilateral lower extremity weakness EXAM: MRI CERVICAL, THORACIC AND LUMBAR SPINE WITHOUT CONTRAST TECHNIQUE: Multiplanar and multiecho pulse sequences of the cervical spine, to  include the craniocervical junction and cervicothoracic junction, and thoracic and lumbar spine, were obtained without intravenous contrast. COMPARISON:  None. FINDINGS: MRI CERVICAL SPINE Alignment: No significant listhesis. Vertebrae: Vertebral body heights are  maintained. No substantial marrow edema. No suspicious osseous lesion. Cord: No abnormal signal. Posterior Fossa, vertebral arteries, paraspinal tissues: Unremarkable. Disc levels: C2-C3: Left facet hypertrophy. No canal or right foraminal stenosis. Moderate left foraminal stenosis. C3-C4: Disc bulge with endplate osteophytes. Left facet hypertrophy. No canal or right foraminal stenosis. Moderate to marked left foraminal stenosis. C4-C5: Disc bulge with endplate osteophytes. Right facet joint effusion. Left facet hypertrophy. No canal stenosis. No foraminal stenosis. C5-C6: Disc bulge with superimposed central disc protrusion and endplate osteophytes. Left facet hypertrophy. No canal or foraminal stenosis. C6-C7: Disc bulge with endplate osteophytes. No canal or foraminal stenosis. C7-T1: Endplate osteophytes. No canal or right foraminal stenosis. Minor left foraminal stenosis. MRI THORACIC SPINE Motion artifact is present. Alignment: Preserved. Vertebrae: Multilevel degenerative endplate irregularity. Chronic appearing degenerative endplate marrow changes, particularly at T5-T6 to T7-T8. No substantial marrow edema. No suspicious osseous lesion. Cord:  No abnormal signal within above limitation. Paraspinal and other soft tissues: Unremarkable. Disc levels: Mild multilevel degenerative disc disease with minor disc bulges or protrusions. Mild facet arthropathy. No high-grade degenerative stenosis. MRI LUMBAR SPINE Motion artifact is present particularly on axial imaging. Segmentation: Standard. Alignment:  Trace retrolisthesis at L3-L4. Vertebrae: Vertebral body heights are maintained apart from small Schmorl's nodes at the superior and inferior L3 on plates  with minor associated marrow edema. No suspicious osseous lesion. Conus medullaris and cauda equina: Conus extends to the T12-L1 level. Conus and cauda equina appear normal. Paraspinal and other soft tissues: Atrophy of the inferior left psoas. Disc levels: L1-L2:  No canal or foraminal stenosis. L2-L3:  No canal or foraminal stenosis. L3-L4: Disc bulge. No canal stenosis. No right foraminal stenosis. Minor left foraminal stenosis. L4-L5: Disc bulge with endplate osteophytic ridging eccentric to the left. Facet arthropathy. No canal or right foraminal stenosis. Mild to moderate left foraminal stenosis. L5-S1: Small right subarticular disc protrusion. Facet arthropathy. No canal stenosis. Slight effacement of the right subarticular recess. No foraminal stenosis. IMPRESSION: No abnormal cord signal. No high-grade canal stenosis. No evidence of discitis/osteomyelitis or epidural collection. Multilevel degenerative changes as detailed above. Electronically Signed   By: Macy Mis M.D.   On: 12/08/2020 18:16   MR PELVIS WO CONTRAST  Result Date: 12/08/2020 CLINICAL DATA:  Nontraumatic lumbosacral plexopathy. Worsening ability to move the lower extremities with right lateral thigh pain. EXAM: MRI PELVIS WITHOUT CONTRAST TECHNIQUE: Multiplanar multisequence MR imaging of the pelvis was performed. No intravenous contrast was administered. COMPARISON:  Pelvic CT 09/15/2020.  Lumbar MRI 12/08/2020. FINDINGS: Bones/Joint/Cartilage The bony pelvis appears normal, without evidence of acute fracture, dislocation or femoral head avascular necrosis. The hip and sacroiliac joints appear normal without effusions, erosions or subchondral edema. Lower lumbar spine findings are dictated separately. Ligaments Not relevant for exam/indication. Muscles and Tendons There is mild generalized edema throughout the pelvic and proximal thigh musculature. No focal fluid collection or atrophy is seen. There is mild gluteus tendinosis  bilaterally with associated mild trochanteric bursal fluid. The common hamstring tendons are intact. Soft tissues There is generalized subcutaneous edema, especially in the low anterior pelvic wall and lateral thighs bilaterally. There is also edema throughout the internal pelvic fat. A small amount of free pelvic fluid is present. No evidence of neurovascular abnormality. IMPRESSION: 1. Nonspecific generalized soft tissue edema involving the internal and subcutaneous fat as well as the pelvic and proximal thigh musculature. The diffuse nature of this edema suggests anasarca. Correlate clinically. No focal fluid collection identified. 2. No acute osseous findings or  significant arthropathic changes. 3. No evidence of neurovascular abnormality. Electronically Signed   By: Richardean Sale M.D.   On: 12/08/2020 20:24     LOS: 7 days   Oren Binet, MD  [facility]    To contact the attending provider between 7A-7P or the covering provider during after hours 7P-7A, please log into the web site [email or web address] and access using universal [place] password for that web site. If you do not have the password, please call the hospital operator.  12/10/2020, 11:38 AM

## 2020-12-10 NOTE — TOC Progression Note (Signed)
Transition of Care [facility]) - Progression Note    Patient Details  Name: Kyle Walton MRN: [id number] Date of Birth: 03-31-1956  Transition of Care [facility]) CM/SW [place], [place] Phone Number: 12/10/2020, 4:18 PM  Clinical Narrative:     CSW visit with patient at bedside. CSW discussed w/patient PT recommendation of short term rehab at [facility]. Patient states he is agreeable to SNF placement. CSW explained the SNF process and informed of current bed offer. Patient expressed some complaints -CSW informed charge RN to resolve. No other concerns at this time.  CSW will continue to follow and assist with discharge planning.   Thurmond Butts, MSW, LCSW Clinical Social Worker   Expected Discharge Plan: Skilled Nursing Facility Barriers to Discharge: Continued Medical Work up  Expected Discharge Plan and Services Expected Discharge Plan: [place]                                               Social Determinants of Health (SDOH) Interventions    Readmission Risk Interventions No flowsheet data found.

## 2020-12-10 NOTE — Progress Notes (Signed)
Pt complaining of rectal pain stated he felt impacted. MD notified and suppository given. Pt still complaining of pain. Tap water enema given and attempted to disimpact was performed . Was able to remove 4 medium size hard balls from rectum. Pt currently on bedpan and educated on holding enema as long as he can. Will report to night RN    Phoebe Sharps, RN

## 2020-12-11 DIAGNOSIS — L8990 Pressure ulcer of unspecified site, unspecified stage: Secondary | ICD-10-CM | POA: Insufficient documentation

## 2020-12-11 DIAGNOSIS — L03115 Cellulitis of right lower limb: Secondary | ICD-10-CM | POA: Diagnosis not present

## 2020-12-11 LAB — CBC
HCT: 32.1 % — ABNORMAL LOW (ref 39.0–52.0)
Hemoglobin: 10.3 g/dL — ABNORMAL LOW (ref 13.0–17.0)
MCH: 28.9 pg (ref 26.0–34.0)
MCHC: 32.1 g/dL (ref 30.0–36.0)
MCV: 90.2 fL (ref 80.0–100.0)
Platelets: 251 10*3/uL (ref 150–400)
RBC: 3.56 MIL/uL — ABNORMAL LOW (ref 4.22–5.81)
RDW: 16.7 % — ABNORMAL HIGH (ref 11.5–15.5)
WBC: 19.2 10*3/uL — ABNORMAL HIGH (ref 4.0–10.5)
nRBC: 0.2 % (ref 0.0–0.2)

## 2020-12-11 LAB — RENAL FUNCTION PANEL
Albumin: 2.3 g/dL — ABNORMAL LOW (ref 3.5–5.0)
Anion gap: 16 — ABNORMAL HIGH (ref 5–15)
BUN: 48 mg/dL — ABNORMAL HIGH (ref 8–23)
CO2: 20 mmol/L — ABNORMAL LOW (ref 22–32)
Calcium: 8.9 mg/dL (ref 8.9–10.3)
Chloride: 96 mmol/L — ABNORMAL LOW (ref 98–111)
Creatinine, Ser: 6.91 mg/dL — ABNORMAL HIGH (ref 0.61–1.24)
GFR, Estimated: 8 mL/min — ABNORMAL LOW (ref >60)
Glucose, Bld: 167 mg/dL — ABNORMAL HIGH (ref 70–99)
Phosphorus: 5.3 mg/dL — ABNORMAL HIGH (ref 2.5–4.6)
Potassium: 5 mmol/L (ref 3.5–5.1)
Sodium: 132 mmol/L — ABNORMAL LOW (ref 135–145)

## 2020-12-11 LAB — GLUCOSE, CAPILLARY
Glucose-Capillary: 156 mg/dL — ABNORMAL HIGH (ref 70–99)
Glucose-Capillary: 182 mg/dL — ABNORMAL HIGH (ref 70–99)
Glucose-Capillary: 197 mg/dL — ABNORMAL HIGH (ref 70–99)

## 2020-12-11 MED ORDER — MILK AND MOLASSES ENEMA
1.0000 | Freq: Once | RECTAL | Status: AC
  Administered 2020-12-11: 240 mL via RECTAL
  Filled 2020-12-11: qty 240

## 2020-12-11 MED ORDER — SODIUM THIOSULFATE 250 MG/ML IV SOLN: 25.0000 g | Freq: Once | INTRAVENOUS | Status: DC

## 2020-12-11 MED ORDER — MIDODRINE HCL 5 MG PO TABS
ORAL_TABLET | ORAL | Status: AC
  Filled 2020-12-11: qty 2

## 2020-12-11 MED ORDER — HEPARIN SODIUM (PORCINE) 1000 UNIT/ML IJ SOLN
INTRAMUSCULAR | Status: AC
  Administered 2020-12-11: 4200 [IU]
  Filled 2020-12-11: qty 5

## 2020-12-11 NOTE — Progress Notes (Signed)
[facility] Progress Note   Subjective:  Pt seen on dialysis, c/o constipation  Objective Vitals:   12/11/20 0743 12/11/20 0745 12/11/20 0800 12/11/20 0830  BP: (!) 105/56 123/84 (!) 121/47 (!) 147/65  Pulse:  (!) 123 (!) 122 (!) 122  Resp:    (!) 22  Temp:      TempSrc:      SpO2:      Weight:      Height:       Physical Exam General: Well developed male, alert and in NAD Heart: Tachycardic, regular rhythm, no murmur auscultated Lungs: CTA bilaterally without wheezing, rhonchi or rales Abdomen: Soft, non-tender, non-distended, +BS Extremities: 1+ pretibial edema, R lateral thigh w/ worsening irregular area of skin reddening, very tender to touch.  Dialysis Access:  L IJ TDC   OP HD: [facility] MWF 4h 450/800 117kg 2K/3Ca bath TDC Hep 4900 + 500u/hr - zemplar 3.5 ug tiw - aranesp 85 mg q wed  - home renal: norvasc 2.5 qd/ phoslo 2 ac tid/ metoprolol 25 bid  Assessment/Plan: 1. R hip / upper leg pain - MRI negative. 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 Have dc'd Ca binder and VDRA and started on Na thiosulfate w/ HD MWF.  Bx was done 6/02.  2. RLE Cellulitis - on abx per primary team (vancomycin). Bld cxs from 5/27 and 5/28 both NTD. Afeb since admission. Leg is better.  3. ESRD - MWF HD. HD today off schedule (did not get HD yest). Use low 2.0 Ca bath when available.  2. HTN/ vol - BP's were soft, home norvasc and metoprolol are on hold. Close to dry wt after last HD.  Midodrine 10 tid added here.  3. MBD ckd - as above, using renvela now. VDRA dc'd. Avoid any/ all Ca/vit D products. Low Ca++bath on HD.  4. Pruritis- chronic, atarax on med list 5. Anemia ckd - last Hb 9.4, getting darbe 60 ug q wed here (6/01).  6. Atrial fib - per pmd, on home eliquis 2.5 bid and home amio. BB on hold.  7. CAD hx CABG/ PCI - on home imdur, plavix. BB on hold  Kelly Splinter, MD 12/08/2020, 10:06  AM   Additional Objective Labs: Basic Metabolic Panel: Recent Labs  Lab 12/07/20 0056 12/08/20 0848 12/11/20 0202  NA 132* 132* 132*  K 4.0 4.0 5.0  CL 95* 97* 96*  CO2 24 24 20*  GLUCOSE 127* 114* 167*  BUN 30* 42* 48*  CREATININE 5.56* 6.65* 6.91*  CALCIUM 8.1* 8.5* 8.9  PHOS 5.1* 5.9* 5.3*   Liver Function Tests: Recent Labs  Lab 12/06/20 0042 12/07/20 0056 12/08/20 0848 12/11/20 0202  AST 44*  --   --   --   ALT 15  --   --   --   ALKPHOS 66  --   --   --   BILITOT 1.0  --   --   --   PROT 5.7*  --   --   --   ALBUMIN 2.3* 2.5* 2.4* 2.3*   No results for input(s): LIPASE, AMYLASE in the last 168 hours. CBC: Recent Labs  Lab 12/05/20 0721 12/06/20 0042 12/07/20 0056 12/08/20 0847 12/11/20 0202  WBC 17.3* 15.3* 16.0* 15.8* 19.2*  HGB 9.8* 9.4* 10.2* 10.1* 10.3*  HCT 30.9* 29.9* 31.6* 32.6* 32.1*  MCV 93.9 94.0 92.7 94.5 90.2  PLT 133* 149* 156 197 251   Blood Culture  Component Value Date/Time   SDES BLOOD RIGHT HAND 12/04/2020 0412   SPECREQUEST  12/04/2020 0412    BOTTLES DRAWN AEROBIC AND ANAEROBIC Blood Culture results may not be optimal due to an inadequate volume of blood received in culture bottles   CULT  12/04/2020 0412    NO GROWTH 5 DAYS Performed at [facility], [place] [address], [place], [place] [address]    REPTSTATUS 12/09/2020 FINAL 12/04/2020 0412    Cardiac Enzymes: Recent Labs  Lab 12/08/20 1145  CKTOTAL 24*   CBG: Recent Labs  Lab 12/10/20 0608 12/10/20 1221 12/10/20 1628 12/10/20 2105 12/11/20 0607  GLUCAP 133* 144* 178* 184* 156*   Iron Studies: No results for input(s): IRON, TIBC, TRANSFERRIN, FERRITIN in the last 72 hours. '@[email or web address]'$ @ Studies/Results: No results found. Medications: . sodium thiosulfate infusion for calciphylaxis    . sodium thiosulfate infusion for calciphylaxis     . midodrine      . acetaminophen  1,000 mg Oral Q8H  . amiodarone  200 mg Oral Daily  . apixaban  2.5 mg Oral  BID  . atorvastatin  40 mg Oral Daily  . Chlorhexidine Gluconate Cloth  6 each Topical Q0600  . cholestyramine light  4 g Oral QID  . clopidogrel  75 mg Oral Daily  . darbepoetin (ARANESP) injection - DIALYSIS  60 mcg Intravenous Q Wed-HD  . insulin aspart  0-15 Units Subcutaneous TID WC  . insulin glargine  10 Units Subcutaneous QHS  . levothyroxine  12.5 mcg Oral QAC breakfast  . lidocaine   Topical TID  . midodrine  10 mg Oral TID WC  . pantoprazole  40 mg Oral Daily  . polyethylene glycol  17 g Oral Daily  . pregabalin  25 mg Oral Daily  . sevelamer carbonate  1,600 mg Oral TID WC  . sodium bicarbonate  325 mg Oral BID

## 2020-12-11 NOTE — Progress Notes (Signed)
   12/11/20 1200  Vitals  Temp 97.6 F (36.4 C)  Temp Source Oral  BP 108/62  BP Location Right Wrist  BP Method Automatic  Patient Position (if appropriate) Lying  Pulse Rate (!) 111  Pulse Rate Source Dinamap  ECG Heart Rate (!) 111  Resp 19  Oxygen Therapy  SpO2 98 %  O2 Device Room Air  Post-Hemodialysis Assessment  Rinseback Volume (mL) 250 mL  KECN 282 V  Dialyzer Clearance Lightly streaked  Duration of HD Treatment -hour(s) 3.5 hour(s)  Hemodialysis Intake (mL) 700 mL  UF Total -Machine (mL) 3386 mL  Net UF (mL) 2686 mL  Tolerated HD Treatment Yes  Post-Hemodialysis Comments hd complete-pt stable  Hemodialysis Catheter Left Double lumen Permanent (Tunneled)  No placement date or time found.   Orientation: Left  Hemodialysis Catheter Type: Double lumen Permanent (Tunneled)  Site Condition No complications  Blue Lumen Status Flushed;Heparin locked;Capped (Central line)  Red Lumen Status Flushed;Heparin locked;Capped (Central line)  Catheter fill solution Heparin 1000 units/ml  Catheter fill volume (Arterial) 2.1 cc  Catheter fill volume (Venous) 2.1  Dressing Type Transparent  Dressing Status Clean;Dry;Intact  Antimicrobial disc in place? Yes  Dressing Change Due 12/11/20  Post treatment catheter status Capped and Clamped  HD complete-pt stable. UF paused intermittently for SBP<80 per orders. Pt asymptomatic. UF=2.6 liters net. Pt c/o severe constipation throughout tx, Dr. Jonnie Finner on unit and made aware. Primary nurse, Garlon Hatchet, made aware.

## 2020-12-11 NOTE — Progress Notes (Signed)
Pt came back to rm 9 from dialysis. Re initiated tele. Vss. Call bell within reach.   Lavenia Atlas, RN

## 2020-12-11 NOTE — Progress Notes (Signed)
Patient wearing CPAP when RT entered room. RT hooked up 4L O2 to CPAP. Spo2 93% at this time. No respiratory distress noted. RT will monitor as needed.

## 2020-12-11 NOTE — Progress Notes (Signed)
PROGRESS NOTE        PATIENT DETAILS Name: Kyle Walton Age: 65 y.o. Sex: male Date of Birth: 1955-10-10 Admit Date: 12/03/2020 Admitting Physician Neena Rhymes, MD [id number], No Pcp Per (Inactive)  Brief Narrative: Patient is a 65 y.o. male PAF, CAD s/p CABG, AICD implantation, PAD-s/p numerous toe amputations, HTN, ESRD on HD MWF-admitted for right leg soft tissue infection and right upper thigh pain.  See below for further details.  Significant events: 5/27>> admit for RLE pain/right leg cellulitis. 5/29>> lethargy/encephalopathy-significant improvement post Narcan 6/2>> skin biopsy-right thigh by general surgery  Significant studies: 5/27>> x-ray right foot: No evidence of fracture/focal bone lesions. 5/27>> chest x-ray: Mild pulmonary vascular congestion. 5/29>> RLE Doppler: No DVT 5/29>> CT right lower extremity: Soft tissue edema-no drainable fluid collection.   6/1>> MRI C-spine/T-spine/L-spine: No discitis/osteomyelitis/epidural collection. 6/1>> MRI pelvis: Soft tissue edema in pelvis/proximal thigh.  No focal fluid collection identified  Antimicrobial therapy: Vancomycin: 5/27>> 6/1 Cefepime: 5/27>>5/29  Microbiology data: 5/27>> Influenza/COVID PCR: Negative 5/27>> blood culture: No growth 5/28>> blood culture:No growth  Procedures : 6/2>> punch biopsy/skin biopsy of right lateral thigh by general surgery  Consults: Nephrology, neurology, general surgery  DVT Prophylaxis : apixaban (ELIQUIS) tablet 2.5 mg Start: 12/03/20 2200 apixaban (ELIQUIS) tablet 2.5 mg    Subjective: No acute issues or events overnight, tolerating dialysis today during evaluation denies nausea vomiting diarrhea headache fever chills chest pain shortness of breath.  Constipation appears to be an ongoing issue as are bilateral feet paresthesias.  Assessment/Plan:  Sepsis due to RLE cellulitis:  Sepsis physiology resolving-significant improvement in  erythema around his RLE wounds.  Blood cultures are negative so far.  Completed a course of vancomycin/cefepime.  See below.  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 -punch biopsy pending -Placed on sodium thiosulfate with dialysis Monday Wednesday Friday per discussion with nephrology -Neurology following, appreciate insight and recommendations -Pain control remains somewhat difficult, episode of lethargy previously requiring Narcan while adjusting his medications due to ongoing complaints -Gabapentin discontinued due to previous episodes of encephalopathy -Topical lidocaine a very little improvement, Lyrica ongoing  Lower extremity weakness:  -Appears to be secondary to pain not a mechanical issue  -He reports bilateral feet numbness today but MRI of the spine has been unremarkable  -No further recommendations from neurology  -PT OT ongoing, ambulation issues appears to be secondary to pain rather than functional issue  Acute toxic encephalopathy, resolved:  - 5/29 -patient was given 1 dose of Narcan.  Continue to wean narcotics as tolerated  ESRD on HD MWF: Nephrology following.  HTN: BP on the softer/lower side-continue midodrine-continue low-dose metoprolol.    Chronic diastolic heart failure: Volume status relatively stable-diuresis with HD.  CAD-s/p CABG 2017: No anginal symptoms-on Plavix/statin/beta-blocker  History of [id number] amiodarone, metoprolol and Eliquis.  History of AICD implantation, stable  History of PAD-s/p multiple toe amputations bilaterally, stable  DM-2, uncontrolled with hyperglycemia: CBG stable-has poor appetite-hence plan is to allow also some permissive hyperglycemia.  Lantus dosage has been decreased to 10 units daily-remains on SSI.  Follow and adjust   Recent Labs    12/10/20 1628 12/10/20 2105  12/11/20 0607  GLUCAP 178* 184* 156*   Hypothyroidism: Continue Synthroid  OSA: Continue CPAP nightly  Legally blind  Morbid Obesity: Estimated body mass index is 50.2 kg/m as calculated from the following:   Height as of this encounter: '5\' 10"'$  (1.778 m).   Weight as of this encounter: 158.7 kg.    Diet: Diet Order            Diet renal/carb modified with fluid restriction Diet-HS Snack? Nothing; Fluid restriction: 1200 mL Fluid; Room service appropriate? Yes with Assist; Fluid consistency: Thin  Diet effective now                  Code Status: Full code   Family Communication: Delcie Roch ([phone]-spoke over the phone on 6/2  Disposition Plan: Status is: Inpatient  Remains inpatient appropriate because:Inpatient level of care appropriate due to severity of illness   Dispo: The patient is from: Home              Anticipated d/c is to: SNF              Patient currently is not medically stable to d/c.   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-on sodium thiosulfate  Antimicrobial agents: Anti-infectives (From admission, onward)   Start     Dose/Rate Route Frequency Ordered Stop   12/08/20 1007  vancomycin (VANCOCIN) 1-5 GM/200ML-% IVPB       Note to Pharmacy: Judieth Keens  : cabinet override      12/08/20 1007 12/08/20 1102   12/06/20 1203  vancomycin (VANCOCIN) 1-5 GM/200ML-% IVPB       Note to Pharmacy: Wallace Cullens   : cabinet override      12/06/20 1203 12/06/20 1206   12/04/20 1030  vancomycin (VANCOREADY) IVPB 1000 mg/200 mL        1,000 mg 200 mL/hr over 60 Minutes Intravenous  Once 12/04/20 0930 12/04/20 1230   12/03/20 2300  vancomycin (VANCOREADY) IVPB 1000 mg/200 mL        1,000 mg 200 mL/hr over 60 Minutes Intravenous Every M-W-F (Hemodialysis) 12/03/20 2019 12/09/20 2359   12/03/20 1830  ceFEPIme (MAXIPIME) 1 g in sodium chloride 0.9  % 100 mL IVPB  Status:  Discontinued        1 g 200 mL/hr over 30 Minutes Intravenous Every 24 hours 12/03/20 1802 12/05/20 1630   12/03/20 1800  vancomycin (VANCOREADY) IVPB 1000 mg/200 mL  Status:  Discontinued        1,000 mg 200 mL/hr over 60 Minutes Intravenous  Once 12/03/20 1756 12/03/20 1801   12/03/20 1300  vancomycin (VANCOREADY) IVPB 1750 mg/350 mL        1,750 mg 175 mL/hr over 120 Minutes Intravenous  Once 12/03/20 1252 12/03/20 1706   12/03/20 1300  cefTRIAXone (ROCEPHIN) 2 g in sodium chloride 0.9 % 100 mL IVPB        2 g 200 mL/hr over 30 Minutes Intravenous  Once 12/03/20 1252 12/03/20 1345       Time spent: 35 minutes-Greater than 50% of this time was spent in counseling, explanation of diagnosis, planning of further management, and coordination of care.  MEDICATIONS: Scheduled Meds: . midodrine      . acetaminophen  1,000 mg Oral Q8H  . amiodarone  200 mg Oral Daily  . apixaban  2.5 mg Oral BID  . atorvastatin  40 mg Oral Daily  . Chlorhexidine Gluconate Cloth  6 each Topical Q0600  . cholestyramine light  4 g Oral QID  . clopidogrel  75  mg Oral Daily  . darbepoetin (ARANESP) injection - DIALYSIS  60 mcg Intravenous Q Wed-HD  . insulin aspart  0-15 Units Subcutaneous TID WC  . insulin glargine  10 Units Subcutaneous QHS  . levothyroxine  12.5 mcg Oral QAC breakfast  . lidocaine   Topical TID  . midodrine  10 mg Oral TID WC  . pantoprazole  40 mg Oral Daily  . polyethylene glycol  17 g Oral Daily  . pregabalin  25 mg Oral Daily  . sevelamer carbonate  1,600 mg Oral TID WC  . sodium bicarbonate  325 mg Oral BID   Continuous Infusions: . sodium thiosulfate infusion for calciphylaxis     PRN Meds:.bisacodyl, hydrOXYzine, naLOXone (NARCAN)  injection, nitroGLYCERIN, ondansetron (ZOFRAN) IV, senna   PHYSICAL EXAM: Vital signs: Vitals:   12/11/20 0000 12/11/20 0500 12/11/20 0735 12/11/20 0740  BP: 140/69 (!) 132/53 96/71   Pulse:  91 (!) 123   Resp: 20  20    Temp: 98.1 F (36.7 C) 97.9 F (36.6 C) 98.4 F (36.9 C)   TempSrc: Axillary Oral Oral   SpO2:  100% 90% 94%  Weight:  (!) 158.8 kg  (!) 158.7 kg  Height:       Filed Weights   12/09/20 0518 12/11/20 0500 12/11/20 0740  Weight: 118.9 kg (!) 158.8 kg (!) 158.7 kg   Body mass index is 50.2 kg/m.   Gen Exam:Alert awake-not in any distress HEENT:atraumatic, normocephalic Chest: B/L clear to auscultation anteriorly CVS:S1S2 regular Abdomen:soft non tender, non distended Extremities: See pictures below-taken on 6/2-remains unchanged on 6/3.   Neurology: Non focal Skin: no rash           I have personally reviewed following labs and imaging studies  LABORATORY DATA: CBC: Recent Labs  Lab 12/05/20 0721 12/06/20 0042 12/07/20 0056 12/08/20 0847 12/11/20 0202  WBC 17.3* 15.3* 16.0* 15.8* 19.2*  HGB 9.8* 9.4* 10.2* 10.1* 10.3*  HCT 30.9* 29.9* 31.6* 32.6* 32.1*  MCV 93.9 94.0 92.7 94.5 90.2  PLT 133* 149* 156 197 [id number]    Basic Metabolic Panel: Recent Labs  Lab 12/05/20 0721 12/06/20 0042 12/06/20 0700 12/07/20 0056 12/08/20 0848 12/11/20 0202  NA 136 134*  --  132* 132* 132*  K 4.6 4.7  --  4.0 4.0 5.0  CL 100 98  --  95* 97* 96*  CO2 20* 22  --  24 24 20*  GLUCOSE 119* 196*  --  127* 114* 167*  BUN 43* 54*  --  30* 42* 48*  CREATININE 7.54* 8.64*  --  5.56* 6.65* 6.91*  CALCIUM 8.4* 8.3*  --  8.1* 8.5* 8.9  PHOS 6.9*  --  7.9* 5.1* 5.9* 5.3*    GFR: Estimated Creatinine Clearance: 16.2 mL/min (A) (by C-G formula based on SCr of 6.91 mg/dL (H)).  Liver Function Tests: Recent Labs  Lab 12/05/20 0721 12/06/20 0042 12/07/20 0056 12/08/20 0848 12/11/20 0202  AST  --  44*  --   --   --   ALT  --  15  --   --   --   ALKPHOS  --  66  --   --   --   BILITOT  --  1.0  --   --   --   PROT  --  5.7*  --   --   --   ALBUMIN 2.4* 2.3* 2.5* 2.4* 2.3*   No results for input(s): LIPASE, AMYLASE in the last 168 hours. Recent Labs  Lab  12/06/20 0042  AMMONIA 56*    Coagulation Profile: No results for input(s): INR, PROTIME in the last 168 hours.  Cardiac Enzymes: Recent Labs  Lab 12/08/20 1145  CKTOTAL 24*    BNP (last 3 results) No results for input(s): PROBNP in the last 8760 hours.  Lipid Profile: No results for input(s): CHOL, HDL, LDLCALC, TRIG, CHOLHDL, LDLDIRECT in the last 72 hours.  Thyroid Function Tests: No results for input(s): TSH, T4TOTAL, FREET4, T3FREE, THYROIDAB in the last 72 hours.  Anemia Panel: No results for input(s): VITAMINB12, FOLATE, FERRITIN, TIBC, IRON, RETICCTPCT in the last 72 hours.  Urine analysis: No results found for: COLORURINE, APPEARANCEUR, LABSPEC, PHURINE, GLUCOSEU, HGBUR, BILIRUBINUR, KETONESUR, PROTEINUR, UROBILINOGEN, NITRITE, LEUKOCYTESUR  Sepsis Labs: Lactic Acid, Venous    Component Value Date/Time   LATICACIDVEN 1.9 12/03/2020 1444    MICROBIOLOGY: Recent Results (from the past 240 hour(s))  Culture, blood (Routine x 2)     Status: None   Collection Time: 12/03/20 12:32 PM   Specimen: BLOOD  Result Value Ref Range Status   Specimen Description BLOOD RIGHT ANTECUBITAL  Final   Special Requests   Final    BOTTLES DRAWN AEROBIC AND ANAEROBIC Blood Culture adequate volume   Culture   Final    NO GROWTH 5 DAYS Performed at [facility], [address] [place], [place] [address]    Report Status 12/08/2020 FINAL  Final  Resp Panel by RT-PCR (Flu A&B, Covid) Nasopharyngeal Swab     Status: None   Collection Time: 12/03/20 12:52 PM   Specimen: Nasopharyngeal Swab; Nasopharyngeal(NP) swabs in vial transport medium  Result Value Ref Range Status   SARS Coronavirus 2 by RT PCR NEGATIVE NEGATIVE Final    Comment: (NOTE) SARS-CoV-2 target nucleic acids are NOT DETECTED.  The SARS-CoV-2 RNA is generally detectable in upper respiratory specimens during the acute phase of infection. The lowest concentration of SARS-CoV-2 viral copies this assay can  detect is 138 copies/mL. A negative result does not preclude SARS-Cov-2 infection and should not be used as the sole basis for treatment or other patient management decisions. A negative result may occur with  improper specimen collection/handling, submission of specimen other than nasopharyngeal swab, presence of viral mutation(s) within the areas targeted by this assay, and inadequate number of viral copies(<138 copies/mL). A negative result must be combined with clinical observations, patient history, and epidemiological information. The expected result is Negative.  Fact Sheet for Patients:  EntrepreneurPulse.com.au  Fact Sheet for Healthcare Providers:  IncredibleEmployment.be  This test is no t yet approved or cleared by the [place] FDA and  has been authorized for detection and/or diagnosis of SARS-CoV-2 by FDA under an Emergency Use Authorization (EUA). This EUA will remain  in effect (meaning this test can be used) for the duration of the COVID-19 declaration under Section 564(b)(1) of the Act, 21 U.S.C.section 360bbb-3(b)(1), unless the authorization is terminated  or revoked sooner.       Influenza A by PCR NEGATIVE NEGATIVE Final   Influenza B by PCR NEGATIVE NEGATIVE Final    Comment: (NOTE) The Xpert Xpress SARS-CoV-2/FLU/RSV plus assay is intended as an aid in the diagnosis of influenza from Nasopharyngeal swab specimens and should not be used as a sole basis for treatment. Nasal washings and aspirates are unacceptable for Xpert Xpress SARS-CoV-2/FLU/RSV testing.  Fact Sheet for Patients: EntrepreneurPulse.com.au  Fact Sheet for Healthcare Providers: IncredibleEmployment.be  This test is not yet approved or cleared by the [place]  States FDA and has been authorized for detection and/or diagnosis of SARS-CoV-2 by FDA under an Emergency Use Authorization (EUA). This EUA will remain in  effect (meaning this test can be used) for the duration of the COVID-19 declaration under Section 564(b)(1) of the Act, 21 U.S.C. section 360bbb-3(b)(1), unless the authorization is terminated or revoked.  Performed at [facility], [place] [address] [place], [place] [address]   Culture, blood (Routine x 2)     Status: None   Collection Time: 12/04/20  4:12 AM   Specimen: BLOOD RIGHT HAND  Result Value Ref Range Status   Specimen Description BLOOD RIGHT HAND  Final   Special Requests   Final    BOTTLES DRAWN AEROBIC AND ANAEROBIC Blood Culture results may not be optimal due to an inadequate volume of blood received in culture bottles   Culture   Final    NO GROWTH 5 DAYS Performed at [facility], [place] [address], [place], [place] [address]    Report Status 12/09/2020 FINAL  Final    RADIOLOGY STUDIES/RESULTS: No results found.   LOS: 8 days   Little Ishikawa, DO  [facility]  To contact the attending provider between 7A-7P or the covering provider during after hours 7P-7A, please log into the web site [email or web address] and access using universal [place] password for that web site. If you do not have the password, please call the hospital operator.  12/11/2020, 8:16 AM

## 2020-12-12 ENCOUNTER — Inpatient Hospital Stay (HOSPITAL_COMMUNITY): Payer: Medicare HMO

## 2020-12-12 DIAGNOSIS — L03115 Cellulitis of right lower limb: Secondary | ICD-10-CM | POA: Diagnosis not present

## 2020-12-12 LAB — BLOOD GAS, ARTERIAL
Acid-base deficit: 1.2 mmol/L (ref 0.0–2.0)
Bicarbonate: 23.2 mmol/L (ref 20.0–28.0)
Drawn by: 511551
FIO2: 32
O2 Saturation: 94.6 %
Patient temperature: 37.1
pCO2 arterial: 40.9 mmHg (ref 32.0–48.0)
pH, Arterial: 7.373 (ref 7.350–7.450)
pO2, Arterial: 83 mmHg (ref 83.0–108.0)

## 2020-12-12 LAB — GLUCOSE, CAPILLARY
Glucose-Capillary: 208 mg/dL — ABNORMAL HIGH (ref 70–99)
Glucose-Capillary: 191 mg/dL — ABNORMAL HIGH (ref 70–99)
Glucose-Capillary: 179 mg/dL — ABNORMAL HIGH (ref 70–99)
Glucose-Capillary: 171 mg/dL — ABNORMAL HIGH (ref 70–99)

## 2020-12-12 MED ORDER — MILK AND MOLASSES ENEMA
1.0000 | Freq: Once | RECTAL | Status: AC
  Administered 2020-12-12: 240 mL via RECTAL
  Filled 2020-12-12: qty 240

## 2020-12-12 MED ORDER — DOCUSATE SODIUM 100 MG PO CAPS
100.0000 mg | ORAL_CAPSULE | Freq: Two times a day (BID) | ORAL | Status: DC
  Administered 2020-12-12 – 2020-12-30 (×33): 100 mg via ORAL
  Filled 2020-12-12 (×34): qty 1

## 2020-12-12 MED ORDER — METOPROLOL TARTRATE 12.5 MG HALF TABLET
12.5000 mg | ORAL_TABLET | Freq: Two times a day (BID) | ORAL | Status: DC
  Administered 2020-12-12 – 2020-12-19 (×14): 12.5 mg via ORAL
  Filled 2020-12-12 (×14): qty 1

## 2020-12-12 MED ORDER — SENNOSIDES-DOCUSATE SODIUM 8.6-50 MG PO TABS
1.0000 | ORAL_TABLET | Freq: Two times a day (BID) | ORAL | Status: DC
  Administered 2020-12-12 – 2020-12-30 (×31): 1 via ORAL
  Filled 2020-12-12 (×33): qty 1

## 2020-12-12 MED ORDER — METOPROLOL TARTRATE 25 MG PO TABS: 25.0000 mg | ORAL_TABLET | Freq: Two times a day (BID) | ORAL | Status: DC

## 2020-12-12 NOTE — TOC Progression Note (Signed)
Transition of Care [facility]) - Progression Note    Patient Details  Name: Kyle Walton MRN: [id number] Date of Birth: [birth year]  Transition of Care [facility]) CM/SW [place], [place] Phone Number: [phone] 12/12/2020, 8:09 AM  Clinical Narrative:     CSW checked hub and the only bed offer is still [facility].  TOC team will continue to assist with discharge planning needs.  Expected Discharge Plan: Skilled Nursing Facility Barriers to Discharge: Continued Medical Work up  Expected Discharge Plan and Services Expected Discharge Plan: [facility]                                               Social Determinants of Health (SDOH) Interventions    Readmission Risk Interventions No flowsheet data found.

## 2020-12-12 NOTE — Progress Notes (Signed)
Upon arrived to pt's rm, Pt eating with HOB <30 degree and coughing occationally. Education gave to pt about aspiration and lying to eating. Pt refused the teaching.   Lavenia Atlas, RN

## 2020-12-12 NOTE — Progress Notes (Signed)
I was asked to assess pt due to mental status change/lethargy. Pt is on BIPAP with sats 97%. Pt is easily arousable, oriented x4 and follows commands. He is generally weak but strength is equal bilaterally. No focal neurological deficits. ABG pending. Pt is ok to transport to CT scan with monitoring and oxygen.

## 2020-12-12 NOTE — Progress Notes (Addendum)
HOSPITAL MEDICINE OVERNIGHT EVENT NOTE    Notified by nursing that patient is exhibiting progressively worsening lethargy throughout the duration of the shift.  Chart reviewed, patient currently being treated for cellulitis, has history of end-stage renal disease.  Patient has not received any sedating agents today.  Of note, patient continues to be in rapid atrial fibrillation despite ongoing treatment with amiodarone and metoprolol.  While patient is lethargic, patient has no focal neurologic deficits.  We will obtain stat CT imaging of the head as well as ABG and follow-up.  Kyle Emerald  MD [facility]   ADDENDUM 6/6 12:15am  Nursing reports that after ABG was performed patient immediately woke up and returned back to baseline mentation without any observable neurologic deficits.  ABG is unremarkable.  Will cancel CT imaging of the head.  [name]

## 2020-12-12 NOTE — Progress Notes (Signed)
[facility] Progress Note   Subjective:  No new/ c/o today, R thigh pain lessening a little bit each day  Objective Vitals:   12/11/20 1951 12/11/20 2026 12/11/20 2311 12/12/20 0324  BP: 125/72 125/72 118/71 106/66  Pulse: (!) 125 (!) 126 (!) 126 (!) 129  Resp: '20 18 20 20  '$ Temp: 98.5 F (36.9 C)  98.5 F (36.9 C) 98.8 F (37.1 C)  TempSrc: Oral  Oral Oral  SpO2: 92% 92% 96% 92%  Weight:    (!) 154.7 kg  Height:    '5\' 10"'$  (1.778 m)   Physical Exam General: Well developed male, alert and in NAD Heart: Tachycardic, regular rhythm, no murmur auscultated Lungs: CTA bilaterally without wheezing, rhonchi or rales Abdomen: Soft, non-tender, non-distended, +BS Extremities: 1+ pretibial edema, R lateral thigh w/ worsening irregular area of skin reddening, very tender to touch.  Dialysis Access:  L IJ TDC   [facility] HD[facility] M[facility] 4h 450/800 117kg 2K/3Ca bath TDC Hep 4900 + 500u/hr - zemplar 3.5 ug tiw - aranesp 85 mg q wed  - home renal: norvasc 2.5 qd/ phoslo 2 ac tid/ metoprolol 25 bid  Assessment/Plan: 1. R hip / upper leg pain - MRI negative. New skin reddening of lateral thigh in area of severe pain and tenderness is suspicious for calciphylaxis. Have dc'd Ca binder and VDRA and started on Na thiosulfate w/ HD MWF.  Bx done 6/02.  2. RLE Cellulitis - sp course of IV abx here w/ improved inflammation in the R lower leg. Bld cxs from 5/27 and 5/28 neg. Afeb since admission.  3. ESRD - MWF HD. Next HD Monday. Use low (2.0) Ca bath when possible.  2. HTN/ vol - home norvasc and metoprolol are on hold. Close to dry wt after last HD. Bed wt's now are off.  Midodrine 10 tid added here.  3. MBD ckd - as above, using renvela now. VDRA dc'd. Avoid any/ all Ca/vit D products. Low Ca++bath on HD.  4. Pruritis- chronic, atarax on med list 5. Anemia ckd - last Hb 9.4, getting darbe 60 ug q wed here (6/01).  6. Atrial fib - per pmd, on home eliquis 2.5 bid and home  amio. BB on hold.  7. CAD hx CABG/ PCI 8. Dispo - per primary team, per pt they are talking about rehab facility  Kelly Splinter, MD 12/08/2020, 10:06 AM   Additional Objective Labs: Basic Metabolic Panel: Recent Labs  Lab 12/07/20 0056 12/08/20 0848 12/11/20 0202  NA 132* 132* 132*  K 4.0 4.0 5.0  CL 95* 97* 96*  CO2 24 24 20*  GLUCOSE 127* 114* 167*  BUN 30* 42* 48*  CREATININE 5.56* 6.65* 6.91*  CALCIUM 8.1* 8.5* 8.9  PHOS 5.1* 5.9* 5.3*   Liver Function Tests: Recent Labs  Lab 12/06/20 0042 12/07/20 0056 12/08/20 0848 12/11/20 0202  AST 44*  --   --   --   ALT 15  --   --   --   ALKPHOS 66  --   --   --   BILITOT 1.0  --   --   --   PROT 5.7*  --   --   --   ALBUMIN 2.3* 2.5* 2.4* 2.3*   No results for input(s): LIPASE, AMYLASE in the last 168 hours. CBC: Recent Labs  Lab 12/05/20 0721 12/06/20 0042 12/07/20 0056 12/08/20 0847 12/11/20 0202  WBC 17.3* 15.3* 16.0* 15.8* 19.2*  HGB 9.8* 9.4* 10.2* 10.1* 10.3*  HCT 30.9* 29.9* 31.6* 32.6* 32.1*  MCV 93.9 94.0 92.7 94.5 90.2  PLT 133* 149* 156 197 251   Blood Culture    Component Value Date/Time   SDES BLOOD RIGHT HAND 12/04/2020 0412   SPECREQUEST  12/04/2020 0412    BOTTLES DRAWN AEROBIC AND ANAEROBIC Blood Culture results may not be optimal due to an inadequate volume of blood received in culture bottles   CULT  12/04/2020 0412    NO GROWTH 5 DAYS Performed at [facility], [place] [address] [place], [place] [address]    REPTSTATUS 12/09/2020 FINAL 12/04/2020 0412    Cardiac Enzymes: Recent Labs  Lab 12/08/20 1145  CKTOTAL 24*   CBG: Recent Labs  Lab 12/10/20 2105 12/11/20 0607 12/11/20 1727 12/11/20 2115 12/12/20 0604  GLUCAP 184* 156* 182* 197* 208*   Iron Studies: No results for input(s): IRON, TIBC, TRANSFERRIN, FERRITIN in the last 72 hours. '@lablastinr3'$ @ Studies/Results: No results found. Medications: . sodium thiosulfate infusion for calciphylaxis Stopped (12/11/20  1203)   . acetaminophen  1,000 mg Oral Q8H  . amiodarone  200 mg Oral Daily  . apixaban  2.5 mg Oral BID  . atorvastatin  40 mg Oral Daily  . Chlorhexidine Gluconate Cloth  6 each Topical Q0600  . cholestyramine light  4 g Oral QID  . clopidogrel  75 mg Oral Daily  . darbepoetin (ARANESP) injection - DIALYSIS  60 mcg Intravenous Q Wed-HD  . insulin aspart  0-15 Units Subcutaneous TID WC  . insulin glargine  10 Units Subcutaneous QHS  . levothyroxine  12.5 mcg Oral QAC breakfast  . lidocaine   Topical TID  . midodrine  10 mg Oral TID WC  . pantoprazole  40 mg Oral Daily  . polyethylene glycol  17 g Oral Daily  . pregabalin  25 mg Oral Daily  . sevelamer carbonate  1,600 mg Oral TID WC  . sodium bicarbonate  325 mg Oral BID

## 2020-12-12 NOTE — Progress Notes (Addendum)
PROGRESS NOTE        PATIENT DETAILS Name: Kyle Walton Age: 65 y.o. Sex: male Date of Birth: Dec 09, 1955 Admit Date: 12/03/2020 Admitting Physician Neena Rhymes, MD BP:[id number], No Pcp Per (Inactive)  Brief Narrative: Patient is a 65 y.o. male PAF, CAD s/p CABG, AICD implantation, PAD-s/p numerous toe amputations, HTN, ESRD on HD MWF-admitted for right leg soft tissue infection and right upper thigh pain.  See below for further details.  Significant events: 5/27>> admit for RLE pain/right leg cellulitis. 5/29>> lethargy/encephalopathy-significant improvement post Narcan 6/2>> skin biopsy-right thigh by general surgery  Significant studies: 5/27>> x-ray right foot: No evidence of fracture/focal bone lesions. 5/27>> chest x-ray: Mild pulmonary vascular congestion. 5/29>> RLE Doppler: No DVT 5/29>> CT right lower extremity: Soft tissue edema-no drainable fluid collection.   6/1>> MRI C-spine/T-spine/L-spine: No discitis/osteomyelitis/epidural collection. 6/1>> MRI pelvis: Soft tissue edema in pelvis/proximal thigh.  No focal fluid collection identified  Antimicrobial therapy: Vancomycin: 5/27>> 6/1 Cefepime: 5/27>>5/29  Microbiology data: 5/27>> Influenza/COVID PCR: Negative 5/27>> blood culture: No growth 5/28>> blood culture:No growth  Procedures : 6/2>> punch biopsy/skin biopsy of right lateral thigh by general surgery  Consults: Nephrology, neurology, general surgery  DVT Prophylaxis : apixaban (ELIQUIS) tablet 2.5 mg Start: 12/03/20 2200 apixaban (ELIQUIS) tablet 2.5 mg    Subjective: No acute issues or events overnight, resting comfortably, still complains somewhat of constipation but had large bowel movement yesterday but feel he still needs to go.  Otherwise denies chest pain nausea vomiting diarrhea headache fevers or chills.  Assessment/Plan:  Sepsis due to RLE cellulitis:  - Sepsis physiology resolving-significant improvement  in erythema around his RLE wounds.   - Blood cultures are negative so far.   - Completed a course of vancomycin/cefepime.  See below.  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 -punch biopsy pending -Placed on sodium thiosulfate with dialysis Monday Wednesday Friday per nephrology -Neurology following, appreciate insight and recommendations -Pain control remains somewhat difficult, episode of lethargy previously requiring Narcan while adjusting his medications due to ongoing complaints -Gabapentin discontinued due to previous episodes of encephalopathy -Topical lidocaine a very little improvement, Lyrica ongoing  Lower extremity weakness:  -Appears to be secondary to pain not a mechanical issue  -He reports bilateral feet numbness today but MRI of the spine has been unremarkable  -No further recommendations from neurology  -PT OT ongoing, ambulation issues appears to be secondary to pain rather than functional issue  Acute toxic encephalopathy, resolved:  - 5/29 -patient was given 1 dose of Narcan.  Continue to wean narcotics as tolerated  ESRD on HD MWF: Nephrology following.  HTN: BP on the softer/lower side-continue midodrine- low-dose metoprolol.    Chronic diastolic heart failure: Volume status relatively stable-diuresis with HD.  CAD-s/p CABG 2017: No anginal symptoms-on Plavix/statin/beta-blocker  History of [id number] amiodarone, metoprolol and Eliquis.  History of AICD implantation, stable  History of PAD-s/p multiple toe amputations bilaterally, stable  DM-2, uncontrolled with hyperglycemia: CBG stable-has poor appetite-hence plan is to allow also some permissive hyperglycemia.  Lantus dosage has been decreased to 10 units daily-remains on SSI.  Follow and adjust   Recent Labs    12/11/20 1727 12/11/20 2115 12/12/20 0604   GLUCAP 182* 197* 208*   Hypothyroidism: Continue Synthroid  OSA: Continue CPAP  nightly  Legally blind  Morbid Obesity: Estimated body mass index is 48.93 kg/m as calculated from the following:   Height as of this encounter: '5\' 10"'$  (1.778 m).   Weight as of this encounter: 154.7 kg.    Diet: Diet Order            Diet renal/carb modified with fluid restriction Diet-HS Snack? Nothing; Fluid restriction: 1200 mL Fluid; Room service appropriate? Yes with Assist; Fluid consistency: Thin  Diet effective now                  Code Status: Full code   Family Communication: Spouse to be updated by patient per discussion today  Disposition Plan: Status is: Inpatient  Remains inpatient appropriate because:Inpatient level of care appropriate due to severity of illness   Dispo: The patient is from: Home              Anticipated d/c is to: SNF              Patient currently is not medically stable to d/c.   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-on sodium thiosulfate  Antimicrobial agents: Anti-infectives (From admission, onward)   Start     Dose/Rate Route Frequency Ordered Stop   12/08/20 1007  vancomycin (VANCOCIN) 1-5 GM/200ML-% IVPB       Note to Pharmacy: Judieth Keens  : cabinet override      12/08/20 1007 12/08/20 1102   12/06/20 1203  vancomycin (VANCOCIN) 1-5 GM/200ML-% IVPB       Note to Pharmacy: Wallace Cullens   : cabinet override      12/06/20 1203 12/06/20 1206   12/04/20 1030  vancomycin (VANCOREADY) IVPB 1000 mg/200 mL        1,000 mg 200 mL/hr over 60 Minutes Intravenous  Once 12/04/20 0930 12/04/20 1230   12/03/20 2300  vancomycin (VANCOREADY) IVPB 1000 mg/200 mL        1,000 mg 200 mL/hr over 60 Minutes Intravenous Every M-W-F (Hemodialysis) 12/03/20 2019 12/09/20 2359   12/03/20 1830  ceFEPIme (MAXIPIME) 1 g in sodium chloride 0.9 % 100 mL IVPB   Status:  Discontinued        1 g 200 mL/hr over 30 Minutes Intravenous Every 24 hours 12/03/20 1802 12/05/20 1630   12/03/20 1800  vancomycin (VANCOREADY) IVPB 1000 mg/200 mL  Status:  Discontinued        1,000 mg 200 mL/hr over 60 Minutes Intravenous  Once 12/03/20 1756 12/03/20 1801   12/03/20 1300  vancomycin (VANCOREADY) IVPB 1750 mg/350 mL        1,750 mg 175 mL/hr over 120 Minutes Intravenous  Once 12/03/20 1252 12/03/20 1706   12/03/20 1300  cefTRIAXone (ROCEPHIN) 2 g in sodium chloride 0.9 % 100 mL IVPB        2 g 200 mL/hr over 30 Minutes Intravenous  Once 12/03/20 1252 12/03/20 1345       Time spent: 35 minutes-Greater than 50% of this time was spent in counseling, explanation of diagnosis, planning of further management, and coordination of care.  MEDICATIONS: Scheduled Meds: . acetaminophen  1,000 mg Oral Q8H  . amiodarone  200 mg Oral Daily  . apixaban  2.5 mg Oral BID  . atorvastatin  40 mg Oral Daily  . Chlorhexidine Gluconate Cloth  6 each Topical Q0600  . cholestyramine light  4 g Oral QID  . clopidogrel  75  mg Oral Daily  . darbepoetin (ARANESP) injection - DIALYSIS  60 mcg Intravenous Q Wed-HD  . docusate sodium  100 mg Oral BID  . insulin aspart  0-15 Units Subcutaneous TID WC  . insulin glargine  10 Units Subcutaneous QHS  . levothyroxine  12.5 mcg Oral QAC breakfast  . lidocaine   Topical TID  . midodrine  10 mg Oral TID WC  . pantoprazole  40 mg Oral Daily  . polyethylene glycol  17 g Oral Daily  . pregabalin  25 mg Oral Daily  . sevelamer carbonate  1,600 mg Oral TID WC  . sodium bicarbonate  325 mg Oral BID   Continuous Infusions: . sodium thiosulfate infusion for calciphylaxis Stopped (12/11/20 1203)   PRN Meds:.bisacodyl, hydrOXYzine, naLOXone (NARCAN)  injection, nitroGLYCERIN, ondansetron (ZOFRAN) IV, senna   PHYSICAL EXAM: Vital signs: Vitals:   12/11/20 1951 12/11/20 2026 12/11/20 2311 12/12/20 0324  BP: 125/72 125/72 118/71 106/66   Pulse: (!) 125 (!) 126 (!) 126 (!) 129  Resp: '20 18 20 20  '$ Temp: 98.5 F (36.9 C)  98.5 F (36.9 C) 98.8 F (37.1 C)  TempSrc: Oral  Oral Oral  SpO2: 92% 92% 96% 92%  Weight:    (!) 154.7 kg  Height:    '5\' 10"'$  (1.778 m)   Filed Weights   12/11/20 0500 12/11/20 0740 12/12/20 0324  Weight: (!) 158.8 kg (!) 158.7 kg (!) 154.7 kg   Body mass index is 48.93 kg/m.   Gen Exam:Alert awake-not in any distress HEENT:atraumatic, normocephalic Chest: B/L clear to auscultation anteriorly CVS:S1S2 regular Abdomen:soft non tender, non distended Extremities: See pictures below-taken on 6/2-remains unchanged on 6/3.   Neurology: Non focal Skin: no rash           I have personally reviewed following labs and imaging studies  LABORATORY DATA: CBC: Recent Labs  Lab 12/05/20 0721 12/06/20 0042 12/07/20 0056 12/08/20 0847 12/11/20 0202  WBC 17.3* 15.3* 16.0* 15.8* 19.2*  HGB 9.8* 9.4* 10.2* 10.1* 10.3*  HCT 30.9* 29.9* 31.6* 32.6* 32.1*  MCV 93.9 94.0 92.7 94.5 90.2  PLT 133* 149* 156 197 [id number]    Basic Metabolic Panel: Recent Labs  Lab 12/05/20 0721 12/06/20 0042 12/06/20 0700 12/07/20 0056 12/08/20 0848 12/11/20 0202  NA 136 134*  --  132* 132* 132*  K 4.6 4.7  --  4.0 4.0 5.0  CL 100 98  --  95* 97* 96*  CO2 20* 22  --  24 24 20*  GLUCOSE 119* 196*  --  127* 114* 167*  BUN 43* 54*  --  30* 42* 48*  CREATININE 7.54* 8.64*  --  5.56* 6.65* 6.91*  CALCIUM 8.4* 8.3*  --  8.1* 8.5* 8.9  PHOS 6.9*  --  7.9* 5.1* 5.9* 5.3*    GFR: Estimated Creatinine Clearance: 15.9 mL/min (A) (by C-G formula based on SCr of 6.91 mg/dL (H)).  Liver Function Tests: Recent Labs  Lab 12/05/20 0721 12/06/20 0042 12/07/20 0056 12/08/20 0848 12/11/20 0202  AST  --  44*  --   --   --   ALT  --  15  --   --   --   ALKPHOS  --  66  --   --   --   BILITOT  --  1.0  --   --   --   PROT  --  5.7*  --   --   --   ALBUMIN 2.4* 2.3* 2.5* 2.4* 2.3*  No results for input(s): LIPASE,  AMYLASE in the last 168 hours. Recent Labs  Lab 12/06/20 0042  AMMONIA 56*    Coagulation Profile: No results for input(s): INR, PROTIME in the last 168 hours.  Cardiac Enzymes: Recent Labs  Lab 12/08/20 1145  CKTOTAL 24*    BNP (last 3 results) No results for input(s): PROBNP in the last 8760 hours.  Lipid Profile: No results for input(s): CHOL, HDL, LDLCALC, TRIG, CHOLHDL, LDLDIRECT in the last 72 hours.  Thyroid Function Tests: No results for input(s): TSH, T4TOTAL, FREET4, T3FREE, THYROIDAB in the last 72 hours.  Anemia Panel: No results for input(s): VITAMINB12, FOLATE, FERRITIN, TIBC, IRON, RETICCTPCT in the last 72 hours.  Urine analysis: No results found for: COLORURINE, APPEARANCEUR, LABSPEC, PHURINE, GLUCOSEU, HGBUR, BILIRUBINUR, KETONESUR, PROTEINUR, UROBILINOGEN, NITRITE, LEUKOCYTESUR  Sepsis Labs: Lactic Acid, Venous    Component Value Date/Time   LATICACIDVEN 1.9 12/03/2020 1444    MICROBIOLOGY: Recent Results (from the past 240 hour(s))  Culture, blood (Routine x 2)     Status: None   Collection Time: 12/03/20 12:32 PM   Specimen: BLOOD  Result Value Ref Range Status   Specimen Description BLOOD RIGHT ANTECUBITAL  Final   Special Requests   Final    BOTTLES DRAWN AEROBIC AND ANAEROBIC Blood Culture adequate volume   Culture   Final    NO GROWTH 5 DAYS Performed at [facility], [address] [place], [place] [address]    Report Status 12/08/2020 FINAL  Final  Resp Panel by RT-PCR (Flu A&B, Covid) Nasopharyngeal Swab     Status: None   Collection Time: 12/03/20 12:52 PM   Specimen: Nasopharyngeal Swab; Nasopharyngeal(NP) swabs in vial transport medium  Result Value Ref Range Status   SARS Coronavirus 2 by RT PCR NEGATIVE NEGATIVE Final    Comment: (NOTE) SARS-CoV-2 target nucleic acids are NOT DETECTED.  The SARS-CoV-2 RNA is generally detectable in upper respiratory specimens during the acute phase of infection. The  lowest concentration of SARS-CoV-2 viral copies this assay can detect is 138 copies/mL. A negative result does not preclude SARS-Cov-2 infection and should not be used as the sole basis for treatment or other patient management decisions. A negative result may occur with  improper specimen collection/handling, submission of specimen other than nasopharyngeal swab, presence of viral mutation(s) within the areas targeted by this assay, and inadequate number of viral copies(<138 copies/mL). A negative result must be combined with clinical observations, patient history, and epidemiological information. The expected result is Negative.  Fact Sheet for Patients:  EntrepreneurPulse.com.au  Fact Sheet for Healthcare Providers:  IncredibleEmployment.be  This test is no t yet approved or cleared by the [place] FDA and  has been authorized for detection and/or diagnosis of SARS-CoV-2 by FDA under an Emergency Use Authorization (EUA). This EUA will remain  in effect (meaning this test can be used) for the duration of the COVID-19 declaration under Section 564(b)(1) of the Act, 21 U.S.C.section 360bbb-3(b)(1), unless the authorization is terminated  or revoked sooner.       Influenza A by PCR NEGATIVE NEGATIVE Final   Influenza B by PCR NEGATIVE NEGATIVE Final    Comment: (NOTE) The Xpert Xpress SARS-CoV-2/FLU/RSV plus assay is intended as an aid in the diagnosis of influenza from Nasopharyngeal swab specimens and should not be used as a sole basis for treatment. Nasal washings and aspirates are unacceptable for Xpert Xpress SARS-CoV-2/FLU/RSV testing.  Fact Sheet for Patients: EntrepreneurPulse.com.au  Fact Sheet for Healthcare  Providers: IncredibleEmployment.be  This test is not yet approved or cleared by the [place] and has been authorized for detection and/or diagnosis of SARS-CoV-2 by FDA under  an Emergency Use Authorization (EUA). This EUA will remain in effect (meaning this test can be used) for the duration of the COVID-19 declaration under Section 564(b)(1) of the Act, 21 U.S.C. section 360bbb-3(b)(1), unless the authorization is terminated or revoked.  Performed at [facility], [place] [address] [place], [place] [address]   Culture, blood (Routine x 2)     Status: None   Collection Time: 12/04/20  4:12 AM   Specimen: BLOOD RIGHT HAND  Result Value Ref Range Status   Specimen Description BLOOD RIGHT HAND  Final   Special Requests   Final    BOTTLES DRAWN AEROBIC AND ANAEROBIC Blood Culture results may not be optimal due to an inadequate volume of blood received in culture bottles   Culture   Final    NO GROWTH 5 DAYS Performed at [facility], [place] [address], [place], [place] [address]    Report Status 12/09/2020 FINAL  Final    RADIOLOGY STUDIES/RESULTS: No results found.   LOS: 9 days   Little Ishikawa, DO  [facility]  To contact the attending provider between 7A-7P or the covering provider during after hours 7P-7A, please log into the web site [email or web address] and access using universal [place] password for that web site. If you do not have the password, please call the hospital operator.  12/12/2020, 7:17 AM

## 2020-12-12 NOTE — Progress Notes (Signed)
Pt is on CPAP, SPO2 98%, RR 20-22, HR 125, atrial fib on monitor. BP soft 91/16- 107/73 mmHg, afebrile.    All bedtime meds are on hold due to Pt becomes more lethargic, very drowsy, responds to voice by opening his eyes then back to sleep again. RRT, Shanon Brow and Dr. Konrad Felix notified. New order received for stat CT head and ABG.   Kennyth Lose, RN

## 2020-12-12 NOTE — Progress Notes (Signed)
ABG drawn per MD order and sent to the lab.  

## 2020-12-12 NOTE — Progress Notes (Addendum)
accroding to the flowsheet, pt been having BMs. Pt kept requested enema whenever staff when in there. Pt did want to use the bed pan. He wanted the nurse to "dig out" his stool.  Education gave to the pt but he refused to listen.   Lavenia Atlas, RN

## 2020-12-13 LAB — COMPREHENSIVE METABOLIC PANEL
ALT: 10 U/L (ref 0–44)
AST: 31 U/L (ref 15–41)
Albumin: 2.3 g/dL — ABNORMAL LOW (ref 3.5–5.0)
Alkaline Phosphatase: 115 U/L (ref 38–126)
Anion gap: 18 — ABNORMAL HIGH (ref 5–15)
BUN: 42 mg/dL — ABNORMAL HIGH (ref 8–23)
CO2: 24 mmol/L (ref 22–32)
Calcium: 9.4 mg/dL (ref 8.9–10.3)
Chloride: 95 mmol/L — ABNORMAL LOW (ref 98–111)
Creatinine, Ser: 5.9 mg/dL — ABNORMAL HIGH (ref 0.61–1.24)
GFR, Estimated: 10 mL/min — ABNORMAL LOW (ref >60)
Glucose, Bld: 160 mg/dL — ABNORMAL HIGH (ref 70–99)
Potassium: 3.9 mmol/L (ref 3.5–5.1)
Sodium: 137 mmol/L (ref 135–145)
Total Bilirubin: 1.1 mg/dL (ref 0.3–1.2)
Total Protein: 6.6 g/dL (ref 6.5–8.1)

## 2020-12-13 LAB — CBC
HCT: 32.5 % — ABNORMAL LOW (ref 39.0–52.0)
Hemoglobin: 10.6 g/dL — ABNORMAL LOW (ref 13.0–17.0)
MCH: 29.2 pg (ref 26.0–34.0)
MCHC: 32.6 g/dL (ref 30.0–36.0)
MCV: 89.5 fL (ref 80.0–100.0)
Platelets: 248 10*3/uL (ref 150–400)
RBC: 3.63 MIL/uL — ABNORMAL LOW (ref 4.22–5.81)
RDW: 16.7 % — ABNORMAL HIGH (ref 11.5–15.5)
WBC: 24.5 10*3/uL — ABNORMAL HIGH (ref 4.0–10.5)
nRBC: 0 % (ref 0.0–0.2)

## 2020-12-13 LAB — GLUCOSE, CAPILLARY
Glucose-Capillary: 142 mg/dL — ABNORMAL HIGH (ref 70–99)
Glucose-Capillary: 157 mg/dL — ABNORMAL HIGH (ref 70–99)
Glucose-Capillary: 149 mg/dL — ABNORMAL HIGH (ref 70–99)
Glucose-Capillary: 188 mg/dL — ABNORMAL HIGH (ref 70–99)
Glucose-Capillary: 161 mg/dL — ABNORMAL HIGH (ref 70–99)

## 2020-12-13 MED ORDER — CHOLESTYRAMINE LIGHT 4 G PO PACK
4.0000 g | PACK | Freq: Every day | ORAL | Status: DC
  Administered 2020-12-13 – 2020-12-17 (×4): 4 g via ORAL
  Filled 2020-12-13 (×6): qty 1

## 2020-12-13 MED ORDER — HEPARIN SODIUM (PORCINE) 1000 UNIT/ML IJ SOLN
INTRAMUSCULAR | Status: AC
  Administered 2020-12-13: 1000 [IU]
  Filled 2020-12-13: qty 7

## 2020-12-13 MED ORDER — ALBUMIN HUMAN 25 % IV SOLN
INTRAVENOUS | Status: AC
  Administered 2020-12-13: 25 g via INTRAVENOUS
  Filled 2020-12-13: qty 100

## 2020-12-13 MED ORDER — HEPARIN SODIUM (PORCINE) 1000 UNIT/ML IJ SOLN
INTRAMUSCULAR | Status: AC
  Administered 2020-12-13: 1000 [IU]
  Filled 2020-12-13: qty 4

## 2020-12-13 MED ORDER — ALBUMIN HUMAN 25 % IV SOLN: 25.0000 g | Freq: Once | INTRAVENOUS | Status: AC

## 2020-12-13 MED ORDER — APIXABAN 5 MG PO TABS
5.0000 mg | ORAL_TABLET | Freq: Two times a day (BID) | ORAL | Status: DC
  Administered 2020-12-13 – 2020-12-31 (×37): 5 mg via ORAL
  Filled 2020-12-13 (×5): qty 1
  Filled 2020-12-13: qty 2
  Filled 2020-12-13 (×31): qty 1

## 2020-12-13 MED ORDER — MIDODRINE HCL 5 MG PO TABS
ORAL_TABLET | ORAL | Status: AC
  Administered 2020-12-13: 10 mg via ORAL
  Filled 2020-12-13: qty 2

## 2020-12-13 NOTE — Progress Notes (Signed)
PT Cancellation Note  Patient Details Name: Nayan Trenary MRN: [id number] DOB: 1956-01-19   Cancelled Treatment:    Reason Eval/Treat Not Completed: (P) Patient at procedure or test/unavailable (at HD dept.) Will continue efforts per PT POC as schedule permits.   Duru Reiger M Lizzete Gough 12/13/2020, 10:30 AM

## 2020-12-13 NOTE — Progress Notes (Signed)
[facility] NEPHROLOGY PROGRESS NOTE  Assessment/ Plan: Pt is a 65 y.o. yo male with HTN, CAD status post CABG, AICD, PAD with numerous toe amputation, ESRD on HD MWF admitted for right leg wound.  [facility] HD[facility] MWF 4h 450/800 117kg 2K/3Ca bath TDC Hep 4900 + 500u/hr  #Right leg pain: MRI negative.  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  The calcium based binders and VDRL was discontinued and patient was started on sodium thiosulfate with dialysis MWF.  The biopsy was done on 6/2, follow-up results.  #Right LE cellulitis status post course of IV antibiotics, blood cultures negative.  # ESRD: MWF, receiving dialysis today and tolerating well.  Next HD on 6/8.  # Anemia hemoglobin at goal.  Continue ESA weekly.  # Secondary hyperparathyroidism: Calcium level acceptable.  We will do low calcium bath and avoid VDR A or calcium based binders.  Continue Renvela.  # HTN/volume: Monitor blood pressure.  On midodrine for intradialytic hypotension.  Subjective: Seen and examined at dialysis unit.  He reports feeling good without any complaint today.  Denies nausea vomiting chest pain shortness of breath.     Objective Vital signs in last 24 hours: Vitals:   12/13/20 0734 12/13/20 0758 12/13/20 0808 12/13/20 0853  BP: (!) 103/48 116/66  (!) 117/99  Pulse: (!) 122 (!) 121 (!) 115 (!) 52  Resp:   (!) 21   Temp:      TempSrc:      SpO2:   100%   Weight:      Height:       Weight change: -4.023 kg  Intake/Output Summary (Last 24 hours) at 12/13/2020 0927 Last data filed at 12/12/2020 1708 Gross per 24 hour  Intake 200 ml  Output --  Net 200 ml       Labs: Basic Metabolic Panel: Recent Labs  Lab 12/07/20 0056 12/08/20 0848 12/11/20 0202 12/13/20 0545  NA 132* 132* 132* 137  K 4.0 4.0 5.0 3.9  CL 95* 97* 96* 95*  CO2 24 24 20* 24  GLUCOSE 127* 114* 167* 160*  BUN 30* 42* 48* 42*  CREATININE 5.56* 6.65* 6.91* 5.90*   CALCIUM 8.1* 8.5* 8.9 9.4  PHOS 5.1* 5.9* 5.3*  --    Liver Function Tests: Recent Labs  Lab 12/08/20 0848 12/11/20 0202 12/13/20 0545  AST  --   --  31  ALT  --   --  10  ALKPHOS  --   --  115  BILITOT  --   --  1.1  PROT  --   --  6.6  ALBUMIN 2.4* 2.3* 2.3*   No results for input(s): LIPASE, AMYLASE in the last 168 hours. No results for input(s): AMMONIA in the last 168 hours. CBC: Recent Labs  Lab 12/07/20 0056 12/08/20 0847 12/11/20 0202 12/13/20 0545  WBC 16.0* 15.8* 19.2* 24.5*  HGB 10.2* 10.1* 10.3* 10.6*  HCT 31.6* 32.6* 32.1* 32.5*  MCV 92.7 94.5 90.2 89.5  PLT 156 197 251 248   Cardiac Enzymes: Recent Labs  Lab 12/08/20 1145  CKTOTAL 24*   CBG: Recent Labs  Lab 12/12/20 0604 12/12/20 1209 12/12/20 1658 12/12/20 2121 12/13/20 0611  GLUCAP 208* 191* 179* 171* 157*    Iron Studies: No results for input(s): IRON, TIBC, TRANSFERRIN, FERRITIN in the last 72 hours. Studies/Results: No results found.  Medications: Infusions: . sodium thiosulfate infusion for calciphylaxis Stopped (12/11/20 1203)    Scheduled Medications: . acetaminophen  1,000 mg Oral Q8H  . amiodarone  200 mg Oral Daily  . apixaban  5 mg Oral BID  . atorvastatin  40 mg Oral Daily  . Chlorhexidine Gluconate Cloth  6 each Topical Q0600  . clopidogrel  75 mg Oral Daily  . darbepoetin (ARANESP) injection - DIALYSIS  60 mcg Intravenous Q Wed-HD  . docusate sodium  100 mg Oral BID  . heparin sodium (porcine)      . insulin aspart  0-15 Units Subcutaneous TID WC  . insulin glargine  10 Units Subcutaneous QHS  . levothyroxine  12.5 mcg Oral QAC breakfast  . lidocaine   Topical TID  . metoprolol tartrate  12.5 mg Oral BID  . midodrine  10 mg Oral TID WC  . pantoprazole  40 mg Oral Daily  . polyethylene glycol  17 g Oral Daily  . pregabalin  25 mg Oral Daily  . senna-docusate  1 tablet Oral BID  . sevelamer carbonate  1,600 mg Oral TID WC    have reviewed scheduled and prn  medications.  Physical Exam: General:NAD, comfortable Heart:RRR, s1s2 nl Lungs:clear b/l, no crackle Abdomen:soft, Non-tender, non-distended Extremities: Trace edema, right lateral thigh has dressing applied. Dialysis Access: TDC.  Artist Bloom Prasad Alisia Vanengen 12/13/2020,9:27 AM  LOS: 10 days

## 2020-12-13 NOTE — Discharge Instructions (Addendum)
Information on my medicine - ELIQUIS (apixaban)  This medication education was reviewed with me or my healthcare representative as part of my discharge preparation.  The pharmacist that spoke with me during my hospital stay was:    Why was Eliquis prescribed for you? Eliquis was prescribed for you to reduce the risk of a blood clot forming that can cause a stroke if you have a medical condition called atrial fibrillation (a type of irregular heartbeat).  What do You need to know about Eliquis ? Take your Eliquis TWICE DAILY - one tablet in the morning and one tablet in the evening with or without food. If you have difficulty swallowing the tablet whole please discuss with your pharmacist how to take the medication safely.  Take Eliquis exactly as prescribed by your doctor and DO NOT stop taking Eliquis without talking to the doctor who prescribed the medication.  Stopping may increase your risk of developing a stroke.  Refill your prescription before you run out.  After discharge, you should have regular check-up appointments with your healthcare provider that is prescribing your Eliquis.  In the future your dose may need to be changed if your kidney function or weight changes by a significant amount or as you get older.  What do you do if you miss a dose? If you miss a dose, take it as soon as you remember on the same day and resume taking twice daily.  Do not take more than one dose of ELIQUIS at the same time to make up a missed dose.  Important Safety Information A possible side effect of Eliquis is bleeding. You should call your healthcare provider right away if you experience any of the following: Bleeding from an injury or your nose that does not stop. Unusual colored urine (red or dark brown) or unusual colored stools (red or black). Unusual bruising for unknown reasons. A serious fall or if you hit your head (even if there is no bleeding).  Some medicines may interact with  Eliquis and might increase your risk of bleeding or clotting while on Eliquis. To help avoid this, consult your healthcare provider or pharmacist prior to using any new prescription or non-prescription medications, including herbals, vitamins, non-steroidal anti-inflammatory drugs (NSAIDs) and supplements.  This website has more information on Eliquis (apixaban): http://www.eliquis.com/eliquis/home   You have an appointment set up with the [facility].  Multiple studies have shown that being followed by a dedicated atrial fibrillation clinic in addition to the standard care you receive from your other physicians improves health. We believe that enrollment in the atrial fibrillation clinic will allow [name] to better care for you.   The phone number to the [facility] is [phone]. The clinic is staffed Monday through Friday from 8:30am to 5pm.  Parking Directions: The clinic is located in the Heart and Vascular Building connected to [facility]. 1)From [address] turn on to [address] and go to the 3rd entrance  (Heart and Vascular entrance) on the right. 2)Look to the right for Heart &Vascular Parking Garage. 3)A code for the entrance is required, for June is 4233.   4)Take the elevators to the 1st floor. Registration is in the room with the glass walls at the end of the hallway.  If you have any trouble parking or locating the clinic, please don't hesitate to call [phone].

## 2020-12-13 NOTE — Progress Notes (Signed)
PROGRESS NOTE        PATIENT DETAILS Name: Kyle Walton Age: 65 y.o. Sex: male Date of Birth: May 19, 1956 Admit Date: 12/03/2020 Admitting Physician Neena Rhymes, MD [id number], No Pcp Per (Inactive)  Brief Narrative: Patient is a 66 y.o. male PAF, CAD s/p CABG, AICD implantation, PAD-s/p numerous toe amputations, HTN, ESRD on HD MWF-admitted for right leg soft tissue infection and right upper thigh pain.  See below for further details.  Significant events: 5/27>> admit for RLE pain/right leg cellulitis. 5/29>> lethargy/encephalopathy-significant improvement post Narcan 6/2>> skin biopsy-right thigh by general surgery  Significant studies: 5/27>> x-ray right foot: No evidence of fracture/focal bone lesions. 5/27>> chest x-ray: Mild pulmonary vascular congestion. 5/29>> RLE Doppler: No DVT 5/29>> CT right lower extremity: Soft tissue edema-no drainable fluid collection.   6/1>> MRI C-spine/T-spine/L-spine: No discitis/osteomyelitis/epidural collection. 6/1>> MRI pelvis: Soft tissue edema in pelvis/proximal thigh.  No focal fluid collection identified  Antimicrobial therapy: Vancomycin: 5/27>> 6/1 Cefepime: 5/27>>5/29  Microbiology data: 5/27>> Influenza/COVID PCR: Negative 5/27>> blood culture: No growth 5/28>> blood culture:No growth  Procedures : 6/2>> punch biopsy/skin biopsy of right lateral thigh by general surgery; pathology sent to derm for further testing/evaluation  Consults: Nephrology, neurology, general surgery  DVT Prophylaxis : apixaban (ELIQUIS) tablet 2.5 mg Start: 12/03/20 2200 apixaban (ELIQUIS) tablet 2.5 mg    Subjective: Rapid for lethargy late evening, imaging and labs unremarkable, patient able to reorient without further incident or issue.  No acute issues or events this morning, patient indicates ongoing issues with regular bowel movements already requesting daily enema but indicates his right leg pain and swelling  and edema continue to improve.  Assessment/Plan:  Sepsis due to RLE cellulitis:  - Sepsis physiology resolving-significant improvement in erythema around his RLE wounds.   - Blood cultures are negative so far.   - Completed a course of vancomycin/cefepime.  See below.  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 -punch biopsy pending -Placed on sodium thiosulfate with dialysis Monday Wednesday Friday per nephrology -Neurology following, appreciate insight and recommendations -Pain control remains somewhat difficult, episode of lethargy previously requiring Narcan while adjusting his medications due to ongoing complaints -Gabapentin discontinued due to previous episodes of encephalopathy -Topical lidocaine a very little improvement, Lyrica ongoing  Lower extremity weakness:  -Appears to be secondary to pain not a mechanical issue  -He reports bilateral feet numbness today but MRI of the spine has been unremarkable  -No further recommendations from neurology  -PT OT ongoing, ambulation issues appears to be secondary to pain rather than functional issue  Acute toxic encephalopathy, resolved:  - 5/29 -patient was given 1 dose of Narcan.  Continue to wean narcotics as tolerated  ESRD on HD MWF: Nephrology following.  HTN: BP on the softer/lower side-continue midodrine- low-dose metoprolol.    Chronic diastolic heart failure: Volume status relatively stable-diuresis with HD.  CAD-s/p CABG 2017: No anginal symptoms-on Plavix/statin/beta-blocker  History of [id number] amiodarone, metoprolol and Eliquis.  History of AICD implantation, stable  History of PAD-s/p multiple toe amputations bilaterally, stable  DM-2, uncontrolled with hyperglycemia: CBG stable-has poor appetite-hence plan is to allow also some permissive hyperglycemia.  Lantus dosage  has been decreased to 10 units daily-remains on SSI.  Follow and adjust   Recent Labs  12/12/20 1658 12/12/20 2121 12/13/20 0611  GLUCAP 179* 171* 157*   Hypothyroidism: Continue Synthroid  OSA: Continue CPAP nightly  Legally blind  Morbid Obesity: Estimated body mass index is 48.93 kg/m as calculated from the following:   Height as of this encounter: '5\' 10"'$  (1.778 m).   Weight as of this encounter: 154.7 kg.    Diet: Diet Order            Diet renal/carb modified with fluid restriction Diet-HS Snack? Nothing; Fluid restriction: 1200 mL Fluid; Room service appropriate? Yes with Assist; Fluid consistency: Thin  Diet effective now                  Code Status: Full code   Family Communication: Spouse to be updated by patient per discussion today  Disposition Plan: Status is: Inpatient  Remains inpatient appropriate because:Inpatient level of care appropriate due to severity of illness   Dispo: The patient is from: Home              Anticipated d/c is to: SNF              Patient currently is not medically stable to d/c.   Difficult to place patient No    Barriers to Discharge: RLE pain/cellulitis vs calciphylaxis treatment/evaluation ongoing  Antimicrobial agents: Anti-infectives (From admission, onward)   Start     Dose/Rate Route Frequency Ordered Stop   12/08/20 1007  vancomycin (VANCOCIN) 1-5 GM/200ML-% IVPB       Note to Pharmacy: Judieth Keens  : cabinet override      12/08/20 1007 12/08/20 1102   12/06/20 1203  vancomycin (VANCOCIN) 1-5 GM/200ML-% IVPB       Note to Pharmacy: Wallace Cullens   : cabinet override      12/06/20 1203 12/06/20 1206   12/04/20 1030  vancomycin (VANCOREADY) IVPB 1000 mg/200 mL        1,000 mg 200 mL/hr over 60 Minutes Intravenous  Once 12/04/20 0930 12/04/20 1230   12/03/20 2300  vancomycin (VANCOREADY) IVPB 1000 mg/200 mL        1,000 mg 200 mL/hr over 60 Minutes Intravenous Every M-W-F (Hemodialysis)  12/03/20 2019 12/09/20 2359   12/03/20 1830  ceFEPIme (MAXIPIME) 1 g in sodium chloride 0.9 % 100 mL IVPB  Status:  Discontinued        1 g 200 mL/hr over 30 Minutes Intravenous Every 24 hours 12/03/20 1802 12/05/20 1630   12/03/20 1800  vancomycin (VANCOREADY) IVPB 1000 mg/200 mL  Status:  Discontinued        1,000 mg 200 mL/hr over 60 Minutes Intravenous  Once 12/03/20 1756 12/03/20 1801   12/03/20 1300  vancomycin (VANCOREADY) IVPB 1750 mg/350 mL        1,750 mg 175 mL/hr over 120 Minutes Intravenous  Once 12/03/20 1252 12/03/20 1706   12/03/20 1300  cefTRIAXone (ROCEPHIN) 2 g in sodium chloride 0.9 % 100 mL IVPB        2 g 200 mL/hr over 30 Minutes Intravenous  Once 12/03/20 1252 12/03/20 1345       Time spent: 35 minutes-Greater than 50% of this time was spent in counseling, explanation of diagnosis, planning of further management, and coordination of care.  MEDICATIONS: Scheduled Meds: . acetaminophen  1,000 mg Oral Q8H  . amiodarone  200 mg Oral Daily  . apixaban  2.5 mg Oral BID  . atorvastatin  40 mg Oral Daily  . Chlorhexidine Gluconate Cloth  6 each Topical  [id number]  . cholestyramine light  4 g Oral QID  . clopidogrel  75 mg Oral Daily  . darbepoetin (ARANESP) injection - DIALYSIS  60 mcg Intravenous Q Wed-HD  . docusate sodium  100 mg Oral BID  . insulin aspart  0-15 Units Subcutaneous TID WC  . insulin glargine  10 Units Subcutaneous QHS  . levothyroxine  12.5 mcg Oral QAC breakfast  . lidocaine   Topical TID  . metoprolol tartrate  12.5 mg Oral BID  . midodrine  10 mg Oral TID WC  . pantoprazole  40 mg Oral Daily  . polyethylene glycol  17 g Oral Daily  . pregabalin  25 mg Oral Daily  . senna-docusate  1 tablet Oral BID  . sevelamer carbonate  1,600 mg Oral TID WC   Continuous Infusions: . sodium thiosulfate infusion for calciphylaxis Stopped (12/11/20 1203)   PRN Meds:.bisacodyl, hydrOXYzine, naLOXone (NARCAN)  injection, nitroGLYCERIN, ondansetron (ZOFRAN)  IV   PHYSICAL EXAM: Vital signs: Vitals:   12/12/20 2306 12/12/20 2347 12/13/20 0356 12/13/20 0453  BP: 112/78  (!) 87/59   Pulse: (!) 125 (!) 125 (!) 120   Resp: '20 20 19   '$ Temp: 98.9 F (37.2 C)  98.1 F (36.7 C)   TempSrc:   Oral   SpO2:  100% 100%   Weight:    (!) 154.7 kg  Height:       Filed Weights   12/11/20 0740 12/12/20 0324 12/13/20 0453  Weight: (!) 158.7 kg (!) 154.7 kg (!) 154.7 kg   Body mass index is 48.93 kg/m.   Gen Exam:Alert awake-not in any distress HEENT:atraumatic, normocephalic Chest: B/L clear to auscultation anteriorly CVS:S1S2 regular Abdomen:soft non tender, non distended Neurology: Non focal - generalized weakness Skin: RLE lateral blanching erythema laterally and proximal to the knee  I have personally reviewed following labs and imaging studies  LABORATORY DATA: CBC: Recent Labs  Lab 12/07/20 0056 12/08/20 0847 12/11/20 0202 12/13/20 0545  WBC 16.0* 15.8* 19.2* 24.5*  HGB 10.2* 10.1* 10.3* 10.6*  HCT 31.6* 32.6* 32.1* 32.5*  MCV 92.7 94.5 90.2 89.5  PLT 156 197 251 [id number]    Basic Metabolic Panel: Recent Labs  Lab 12/07/20 0056 12/08/20 0848 12/11/20 0202 12/13/20 0545  NA 132* 132* 132* 137  K 4.0 4.0 5.0 3.9  CL 95* 97* 96* 95*  CO2 24 24 20* 24  GLUCOSE 127* 114* 167* 160*  BUN 30* 42* 48* 42*  CREATININE 5.56* 6.65* 6.91* 5.90*  CALCIUM 8.1* 8.5* 8.9 9.4  PHOS 5.1* 5.9* 5.3*  --     GFR: Estimated Creatinine Clearance: 18.7 mL/min (A) (by C-G formula based on SCr of 5.9 mg/dL (H)).  Liver Function Tests: Recent Labs  Lab 12/07/20 0056 12/08/20 0848 12/11/20 0202 12/13/20 0545  AST  --   --   --  31  ALT  --   --   --  10  ALKPHOS  --   --   --  115  BILITOT  --   --   --  1.1  PROT  --   --   --  6.6  ALBUMIN 2.5* 2.4* 2.3* 2.3*   No results for input(s): LIPASE, AMYLASE in the last 168 hours. No results for input(s): AMMONIA in the last 168 hours.  Coagulation Profile: No results for input(s):  INR, PROTIME in the last 168 hours.  Cardiac Enzymes: Recent Labs  Lab 12/08/20 1145  CKTOTAL 24*    BNP (last 3 results)  No results for input(s): PROBNP in the last 8760 hours.  Lipid Profile: No results for input(s): CHOL, HDL, LDLCALC, TRIG, CHOLHDL, LDLDIRECT in the last 72 hours.  Thyroid Function Tests: No results for input(s): TSH, T4TOTAL, FREET4, T3FREE, THYROIDAB in the last 72 hours.  Anemia Panel: No results for input(s): VITAMINB12, FOLATE, FERRITIN, TIBC, IRON, RETICCTPCT in the last 72 hours.  Urine analysis: No results found for: COLORURINE, APPEARANCEUR, LABSPEC, PHURINE, GLUCOSEU, HGBUR, BILIRUBINUR, KETONESUR, PROTEINUR, UROBILINOGEN, NITRITE, LEUKOCYTESUR  Sepsis Labs: Lactic Acid, Venous    Component Value Date/Time   LATICACIDVEN 1.9 12/03/2020 1444    MICROBIOLOGY: Recent Results (from the past 240 hour(s))  Culture, blood (Routine x 2)     Status: None   Collection Time: 12/03/20 12:32 PM   Specimen: BLOOD  Result Value Ref Range Status   Specimen Description BLOOD RIGHT ANTECUBITAL  Final   Special Requests   Final    BOTTLES DRAWN AEROBIC AND ANAEROBIC Blood Culture adequate volume   Culture   Final    NO GROWTH 5 DAYS Performed at [facility], [address], [place], [place] [address]    Report Status 12/08/2020 FINAL  Final  Resp Panel by RT-PCR (Flu A&B, Covid) Nasopharyngeal Swab     Status: None   Collection Time: 12/03/20 12:52 PM   Specimen: Nasopharyngeal Swab; Nasopharyngeal(NP) swabs in vial transport medium  Result Value Ref Range Status   SARS Coronavirus 2 by RT PCR NEGATIVE NEGATIVE Final    Comment: (NOTE) SARS-CoV-2 target nucleic acids are NOT DETECTED.  The SARS-CoV-2 RNA is generally detectable in upper respiratory specimens during the acute phase of infection. The lowest concentration of SARS-CoV-2 viral copies this assay can detect is 138 copies/mL. A negative result does not preclude SARS-Cov-2 infection  and should not be used as the sole basis for treatment or other patient management decisions. A negative result may occur with  improper specimen collection/handling, submission of specimen other than nasopharyngeal swab, presence of viral mutation(s) within the areas targeted by this assay, and inadequate number of viral copies(<138 copies/mL). A negative result must be combined with clinical observations, patient history, and epidemiological information. The expected result is Negative.  Fact Sheet for Patients:  EntrepreneurPulse.com.au  Fact Sheet for Healthcare Providers:  IncredibleEmployment.be  This test is no t yet approved or cleared by the [place] FDA and  has been authorized for detection and/or diagnosis of SARS-CoV-2 by FDA under an Emergency Use Authorization (EUA). This EUA will remain  in effect (meaning this test can be used) for the duration of the COVID-19 declaration under Section 564(b)(1) of the Act, 21 U.S.C.section 360bbb-3(b)(1), unless the authorization is terminated  or revoked sooner.       Influenza A by PCR NEGATIVE NEGATIVE Final   Influenza B by PCR NEGATIVE NEGATIVE Final    Comment: (NOTE) The Xpert Xpress SARS-CoV-2/FLU/RSV plus assay is intended as an aid in the diagnosis of influenza from Nasopharyngeal swab specimens and should not be used as a sole basis for treatment. Nasal washings and aspirates are unacceptable for Xpert Xpress SARS-CoV-2/FLU/RSV testing.  Fact Sheet for Patients: EntrepreneurPulse.com.au  Fact Sheet for Healthcare Providers: IncredibleEmployment.be  This test is not yet approved or cleared by the [place] FDA and has been authorized for detection and/or diagnosis of SARS-CoV-2 by FDA under an Emergency Use Authorization (EUA). This EUA will remain in effect (meaning this test can be used) for the duration of the COVID-19 declaration  under Section  564(b)(1) of the Act, 21 U.S.C. section 360bbb-3(b)(1), unless the authorization is terminated or revoked.  Performed at [facility], [place] [address], [place], [place] [address]   Culture, blood (Routine x 2)     Status: None   Collection Time: 12/04/20  4:12 AM   Specimen: BLOOD RIGHT HAND  Result Value Ref Range Status   Specimen Description BLOOD RIGHT HAND  Final   Special Requests   Final    BOTTLES DRAWN AEROBIC AND ANAEROBIC Blood Culture results may not be optimal due to an inadequate volume of blood received in culture bottles   Culture   Final    NO GROWTH 5 DAYS Performed at [facility], [place] [address] [place], [place] [address]    Report Status 12/09/2020 FINAL  Final    RADIOLOGY STUDIES/RESULTS: No results found.   LOS: 10 days   Little Ishikawa, DO  [facility]  To contact the attending provider between 7A-7P or the covering provider during after hours 7P-7A, please log into the web site [email or web address] and access using universal  password for that web site. If you do not have the password, please call the hospital operator.  12/13/2020, 7:19 AM

## 2020-12-13 NOTE — TOC Progression Note (Addendum)
Transition of Care [facility]) - Progression Note    Patient Details  Name: Kyle Walton MRN: [id number] Date of Birth: Nov 23, 1955  Transition of Care [facility]) CM/SW [place], [place] Phone Number: 12/13/2020, 2:52 PM  Clinical Narrative:    [facility] -declined but states no availability  CSW called [facility] to follow up on bed availability- CSW was informed no bed as of today but maybe later this week.   [facility] -declined [facility] -declined  [employer] -declined [facility], MSW, LCSW Clinical Education officer, museum    Expected Discharge Plan: Skilled Nursing Facility Barriers to Discharge: Continued Medical Work up  [employer] and Services Expected Discharge Plan: [place]                                               Social Determinants of Health (SDOH) Interventions    Readmission Risk Interventions No flowsheet data found.

## 2020-12-13 NOTE — Progress Notes (Addendum)
Pt was on cholestyramine BID prior to admission and on it QID here. It has the potential to bind to a lot of meds. D/w Dr Avon Gully and we will hold it for now. We will also increase his apixaban to '5mg'$  BID since he is <65yo and wt >60kg.  Onnie Boer, PharmD, BCIDP, AAHIVP, CPP Infectious Disease Pharmacist 12/13/2020 7:28 AM   Addendum  Pt would like to continue his cholestyramine but he would agree to reduce the interval to qday so we can schedule around it.  Onnie Boer, PharmD, BCIDP, AAHIVP, CPP Infectious Disease Pharmacist 12/13/2020 1:14 PM

## 2020-12-13 NOTE — Progress Notes (Signed)
We discussed with Dr.G. Cyd Silence about Pt's mental status comes back to his previous baseline. He is more awake, follows commands. His ABG result looks good. No respiratory distress noted. MD canceled order CT head. We will continue to monitor.  Kennyth Lose, RN

## 2020-12-14 LAB — GLUCOSE, CAPILLARY
Glucose-Capillary: 215 mg/dL — ABNORMAL HIGH (ref 70–99)
Glucose-Capillary: 186 mg/dL — ABNORMAL HIGH (ref 70–99)
Glucose-Capillary: 187 mg/dL — ABNORMAL HIGH (ref 70–99)
Glucose-Capillary: 163 mg/dL — ABNORMAL HIGH (ref 70–99)

## 2020-12-14 LAB — SURGICAL PATHOLOGY

## 2020-12-14 MED ORDER — CHLORHEXIDINE GLUCONATE CLOTH 2 % EX PADS
6.0000 | MEDICATED_PAD | Freq: Every day | CUTANEOUS | Status: DC
  Administered 2020-12-14 – 2020-12-16 (×3): 6 via TOPICAL

## 2020-12-14 NOTE — Progress Notes (Signed)
Patient is requesting to have PT come by and get him to sit on side of bed. Patient is concerned that he is getting bed sores (crack/tear) on perineum  Plus breakdown on sacrum from laying in bed. Patient states that he does not lay around when he is at home. Will continue to monitor

## 2020-12-14 NOTE — Progress Notes (Signed)
Occupational Therapy Treatment Patient Details Name: Kyle Walton MRN: [id number] DOB: 08-15-1955 Today's Date: 12/14/2020    History of present illness Pt is a 65 y.o. male who presented 5/27 with R leg cellulitis and sepsis. PMH: PAF, OSA, MI, ESRD on HD MWF, DM on insulin, s/p multiple toe amputations, morbid obesity, ESBL/VRE/MRSA and CAD.   OT comments  Pt progressing gradually towards OT goals, eager to participate with therapy today. Pt able to demo improvements in mobility but continues to require extensive assist for LB ADLs and transfers. Pt overall Mod A x 2 for bed mobility, Mod A x 2 for sit to stand in [place] and transfer to chair for lunch. Pt able to demo good sitting balance EOB for ADLs. BP elevated during session and pt notably fatigued, though pleased with progress. Plan to educate in UE HEP for increased strength and progress toilet transfers as appropriate.    Follow Up Recommendations  SNF;Supervision/Assistance - 24 hour    Equipment Recommendations  3 in 1 bedside commode;Tub/shower bench;Wheelchair (measurements OT);Wheelchair cushion (measurements OT);Hospital bed    Recommendations for Other Services      Precautions / Restrictions Precautions Precautions: Fall;Other (comment) Precaution Comments: contact precautions Restrictions Weight Bearing Restrictions: No       Mobility Bed Mobility Overal bed mobility: Needs Assistance Bed Mobility: Supine to Sit     Supine to sit: Mod assist;+2 for safety/equipment;+2 for physical assistance;HOB elevated     General bed mobility comments: Able to assist in advancing LEs, assist to scoot and heavy assist to advance trunk. cues to use bed rails to assist    Transfers Overall transfer level: Needs assistance Equipment used: Ambulation equipment used Transfers: Sit to/from Stand Sit to Stand: Mod assist;+2 safety/equipment;+2 physical assistance;From elevated surface         General transfer comment: Mod  A x 2 for sit to stand in [place], good initiation to power up today. Able to stand from stedy pads at [place] A x 2. transferred to recliner chair via [employer] Overall balance assessment: Needs assistance Sitting-balance support: Bilateral upper extremity supported;Feet supported Sitting balance-Leahy Scale: Fair Sitting balance - Comments: able to sit statically while brushing teeth EOB   Standing balance support: Bilateral upper extremity supported;During functional activity Standing balance-Leahy Scale: Poor Standing balance comment: in [place], reliant on UE support                           ADL either performed or assessed with clinical judgement   ADL Overall ADL's : Needs assistance/impaired     Grooming: Supervision/safety;Sitting;Oral care Grooming Details (indicate cue type and reason): Supervision for safety to maintain balance during task sitting EOB             Lower Body Dressing: Total assistance;Bed level Lower Body Dressing Details (indicate cue type and reason): Total A bed level to don socks due to difficulty reaching feet               General ADL Comments: Pt continues to be pain limited but much improved sitting balance and standing attempts in [employer] Assessment?: No apparent visual deficits   Perception     Praxis      Cognition Arousal/Alertness: Awake/alert Behavior During Therapy: WFL for tasks assessed/performed Overall Cognitive Status: Within Functional Limits for tasks assessed  General Comments: pt internally distracted due to pain but participatory as able        Exercises     Shoulder Instructions       General Comments HR up to 126bpm with activity. BP elevated during session, 161/124 sitting EOB and 165/133 after transfer    Pertinent Vitals/ Pain       Pain Assessment: Faces Faces Pain Scale: Hurts even more Pain Location: R lateral leg  with movement, R thigh with touch Pain Descriptors / Indicators: Grimacing;Guarding;Moaning Pain Intervention(s): Monitored during session;Limited activity within patient's tolerance  Home Living                                          Prior Functioning/Environment              Frequency  Min 2X/week        Progress Toward Goals  OT Goals(current goals can now be found in the care plan section)  Progress towards OT goals: Progressing toward goals  Acute Rehab OT Goals Patient Stated Goal: get strength back OT Goal Formulation: With patient Time For Goal Achievement: 12/19/20 Potential to Achieve Goals: Good ADL Goals Pt Will Transfer to Toilet: with mod assist;with transfer board;bedside commode Additional ADL Goal #1: Pt will complete bed mobility including rolling with min assist, to relieve pressure and ease caregiver burden Additional ADL Goal #2: Pt will sit EOB with min support to maintain upright position for 3 mins to prepare for seated ADL's.  Plan Discharge plan remains appropriate    Co-evaluation    PT/OT/SLP Co-Evaluation/Treatment: Yes Reason for Co-Treatment: For patient/therapist safety;To address functional/ADL transfers   OT goals addressed during session: ADL's and self-care      AM-PAC OT "6 Clicks" Daily Activity     Outcome Measure   Help from another person eating meals?: A Little Help from another person taking care of personal grooming?: A Little Help from another person toileting, which includes using toliet, bedpan, or urinal?: Total Help from another person bathing (including washing, rinsing, drying)?: A Lot Help from another person to put on and taking off regular upper body clothing?: A Little Help from another person to put on and taking off regular lower body clothing?: Total 6 Click Score: 13    End of Session Equipment Utilized During Treatment: Gait belt;Other (comment) Charlaine Dalton)  OT Visit Diagnosis:  Unsteadiness on feet (R26.81);Other abnormalities of gait and mobility (R26.89);Muscle weakness (generalized) (M62.81)   Activity Tolerance Patient tolerated treatment well   Patient Left in chair;with call bell/phone within reach;with chair alarm set   Nurse Communication Mobility status;Other (comment) (BP reading)        Time: [id number] OT Time Calculation (min): 39 min  Charges: OT General Charges $OT Visit: 1 Visit OT Treatments $Self Care/Home Management : 8-22 mins $Therapeutic Activity: 8-22 mins  Malachy Chamber, OTR/L Acute Rehab Services Office: [phone]   Layla Maw 12/14/2020, 12:00 PM

## 2020-12-14 NOTE — Care Management Important Message (Signed)
Important Message  Patient Details  Name: Kveon Decrescenzo MRN: [id number] Date of Birth: Mar 05, 1956   Medicare Important Message Given:  Yes     [name] 12/14/2020, 8:27 AM

## 2020-12-14 NOTE — Progress Notes (Signed)
PROGRESS NOTE        PATIENT DETAILS Name: Kyle Walton Age: 65 y.o. Sex: male Date of Birth: Sep 20, 1955 Admit Date: 12/03/2020 Admitting Physician Neena Rhymes, MD [id number], No Pcp Per (Inactive)  Brief Narrative: Patient is a 65 y.o. male PAF, CAD s/p CABG, AICD implantation, PAD-s/p numerous toe amputations, HTN, ESRD on HD MWF-admitted for right leg soft tissue infection and right upper thigh pain.  See below for further details.  Significant events: 5/27>> admit for RLE pain/right leg cellulitis. 5/29>> lethargy/encephalopathy-significant improvement post Narcan 6/2>> skin biopsy-right thigh by general surgery  Significant studies: 5/27>> x-ray right foot: No evidence of fracture/focal bone lesions. 5/27>> chest x-ray: Mild pulmonary vascular congestion. 5/29>> RLE Doppler: No DVT 5/29>> CT right lower extremity: Soft tissue edema-no drainable fluid collection.   6/1>> MRI C-spine/T-spine/L-spine: No discitis/osteomyelitis/epidural collection. 6/1>> MRI pelvis: Soft tissue edema in pelvis/proximal thigh.  No focal fluid collection identified  Antimicrobial therapy: Vancomycin: 5/27>> 6/1 Cefepime: 5/27>>5/29  Microbiology data: 5/27>> Influenza/COVID PCR: Negative 5/27>> blood culture: No growth 5/28>> blood culture:No growth  Procedures : 6/2>> punch biopsy/skin biopsy of right lateral thigh by general surgery; pathology sent to derm for further testing/evaluation - results not finalized as of 12/14/20  Consults: Nephrology, neurology, general surgery  DVT Prophylaxis :  apixaban (ELIQUIS) tablet 5 mg    Subjective: No acute issues or events overnight, having normal bowel movement this morning without need for enema, patient otherwise voices motivation to begin PT and OT today and more aggressively as he continues to improve.  Right lower extremity pain resolving although not yet back to baseline.  Assessment/Plan:  Sepsis due  to RLE cellulitis:  - Sepsis physiology resolving-significant improvement in erythema around his RLE wounds.   - Blood cultures are negative so far.   - Completed a course of vancomycin/cefepime.  See below.  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 -punch biopsy pending -should be available in the next 24 hours per pathology lab today -Placed on sodium thiosulfate with dialysis Monday Wednesday Friday per nephrology -Neurology following, appreciate insight and recommendations -Narcotics previously discontinued after multiple issues with an episode of lethargy requiring Narcan last week -Gabapentin discontinued due to previous episodes of encephalopathy -Topical lidocaine a very little improvement, Lyrica ongoing  Lower extremity weakness/acute on chronic ambulatory dysfunction:  -Right greater than left - appears to be secondary to pain and not a mechanical/impingement issue  -He reports bilateral feet numbness today but MRI of the spine has been unremarkable  -No further recommendations from neurology  -PT/OT ongoing, ambulation issues appears to be secondary to pain rather than functional issue  Acute toxic encephalopathy, resolved:  - 5/29 -patient was given 1 dose of Narcan.  Continue to hold off on narcotics  ESRD on HD MWF: Nephrology following.  HTN: BP on the softer/lower side-continue midodrine- low-dose metoprolol.    Chronic diastolic heart failure: Volume status relatively stable-diuresis with HD.  CAD-s/p CABG 2017: No anginal symptoms-on Plavix/statin/beta-blocker  History of [id number] amiodarone, metoprolol and Eliquis.  History of AICD implantation, stable  History of PAD-s/p multiple toe amputations bilaterally, stable  DM-2, uncontrolled with hyperglycemia: CBG stable-has very poor appetite-liberalize diet, allow  permissive hyperglycemia.  Lantus continued at 10 units daily-remains on SSI.    Hypothyroidism: Continue  Synthroid  OSA: Continue CPAP nightly  Legally blind  Morbid Obesity: Estimated body mass index is 49.22 kg/m as calculated from the following:   Height as of this encounter: '5\' 10"'$  (1.778 m).   Weight as of this encounter: 155.6 kg.    Diet: Diet Order            Diet renal/carb modified with fluid restriction Diet-HS Snack? Nothing; Fluid restriction: 1200 mL Fluid; Room service appropriate? Yes with Assist; Fluid consistency: Thin  Diet effective now                  Code Status: Full code   Family Communication: Spouse to be updated by patient per discussion today  Disposition Plan: Status is: Inpatient  Remains inpatient appropriate because:Inpatient level of care appropriate due to severity of illness   Dispo: The patient is from: Home              Anticipated d/c is to: SNF              Patient currently is not medically stable to d/c.   Difficult to place patient No    Barriers to Discharge: RLE pain/cellulitis vs calciphylaxis treatment/evaluation ongoing  Antimicrobial agents: Anti-infectives (From admission, onward)   Start     Dose/Rate Route Frequency Ordered Stop   12/08/20 1007  vancomycin (VANCOCIN) 1-5 GM/200ML-% IVPB       Note to Pharmacy: Judieth Keens  : cabinet override      12/08/20 1007 12/08/20 1102   12/06/20 1203  vancomycin (VANCOCIN) 1-5 GM/200ML-% IVPB       Note to Pharmacy: Wallace Cullens   : cabinet override      12/06/20 1203 12/06/20 1206   12/04/20 1030  vancomycin (VANCOREADY) IVPB 1000 mg/200 mL        1,000 mg 200 mL/hr over 60 Minutes Intravenous  Once 12/04/20 0930 12/04/20 1230   12/03/20 2300  vancomycin (VANCOREADY) IVPB 1000 mg/200 mL        1,000 mg 200 mL/hr over 60 Minutes Intravenous Every M-W-F (Hemodialysis) 12/03/20 2019 12/09/20 2359   12/03/20 1830  ceFEPIme (MAXIPIME) 1 g in sodium chloride  0.9 % 100 mL IVPB  Status:  Discontinued        1 g 200 mL/hr over 30 Minutes Intravenous Every 24 hours 12/03/20 1802 12/05/20 1630   12/03/20 1800  vancomycin (VANCOREADY) IVPB 1000 mg/200 mL  Status:  Discontinued        1,000 mg 200 mL/hr over 60 Minutes Intravenous  Once 12/03/20 1756 12/03/20 1801   12/03/20 1300  vancomycin (VANCOREADY) IVPB 1750 mg/350 mL        1,750 mg 175 mL/hr over 120 Minutes Intravenous  Once 12/03/20 1252 12/03/20 1706   12/03/20 1300  cefTRIAXone (ROCEPHIN) 2 g in sodium chloride 0.9 % 100 mL IVPB        2 g 200 mL/hr over 30 Minutes Intravenous  Once 12/03/20 1252 12/03/20 1345       Time spent: 35 minutes-Greater than 50% of this time was spent in counseling, explanation of diagnosis, planning of further management, and coordination of care.  MEDICATIONS: Scheduled Meds: . acetaminophen  1,000 mg Oral Q8H  . amiodarone  200 mg Oral Daily  . apixaban  5 mg Oral BID  . atorvastatin  40 mg Oral Daily  . Chlorhexidine Gluconate Cloth  6 each Topical Q0600  . cholestyramine light  4 g Oral Q1400  . clopidogrel  75  mg Oral Daily  . darbepoetin (ARANESP) injection - DIALYSIS  60 mcg Intravenous Q Wed-HD  . docusate sodium  100 mg Oral BID  . insulin aspart  0-15 Units Subcutaneous TID WC  . insulin glargine  10 Units Subcutaneous QHS  . levothyroxine  12.5 mcg Oral QAC breakfast  . lidocaine   Topical TID  . metoprolol tartrate  12.5 mg Oral BID  . midodrine  10 mg Oral TID WC  . pantoprazole  40 mg Oral Daily  . polyethylene glycol  17 g Oral Daily  . pregabalin  25 mg Oral Daily  . senna-docusate  1 tablet Oral BID  . sevelamer carbonate  1,600 mg Oral TID WC   Continuous Infusions: . sodium thiosulfate infusion for calciphylaxis Stopped (12/13/20 1246)   PRN Meds:.bisacodyl, hydrOXYzine, naLOXone (NARCAN)  injection, nitroGLYCERIN, ondansetron (ZOFRAN) IV   PHYSICAL EXAM: Vital signs: Vitals:   12/13/20 2257 12/13/20 2350 12/14/20 0013  12/14/20 0600  BP:  (!) 101/49  (!) 100/58  Pulse: (!) 112 (!) 125  (!) 124  Resp: '18 20  20  '$ Temp:  97.7 F (36.5 C)  98.7 F (37.1 C)  TempSrc:  Axillary  Oral  SpO2: 100% 95%  100%  Weight:   (!) 155.6 kg   Height:       Filed Weights   12/13/20 0718 12/13/20 1134 12/14/20 0013  Weight: (!) 150.5 kg (!) 149 kg (!) 155.6 kg   Body mass index is 49.22 kg/m.   Gen Exam:Alert awake-not in any distress HEENT:atraumatic, normocephalic Chest: B/L clear to auscultation anteriorly CVS:S1S2 regular Abdomen:soft non tender, non distended Neurology: Non focal - generalized weakness 4 out of 5, right lower extremity 3 out of 5 limited by pain Skin: RLE lateral blanching erythema laterally and proximal to the knee -improving in size/sensitivity over the past 48 hours  I have personally reviewed following labs and imaging studies  LABORATORY DATA: CBC: Recent Labs  Lab 12/08/20 0847 12/11/20 0202 12/13/20 0545  WBC 15.8* 19.2* 24.5*  HGB 10.1* 10.3* 10.6*  HCT 32.6* 32.1* 32.5*  MCV 94.5 90.2 89.5  PLT 197 251 [id number]    Basic Metabolic Panel: Recent Labs  Lab 12/08/20 0848 12/11/20 0202 12/13/20 0545  NA 132* 132* 137  K 4.0 5.0 3.9  CL 97* 96* 95*  CO2 24 20* 24  GLUCOSE 114* 167* 160*  BUN 42* 48* 42*  CREATININE 6.65* 6.91* 5.90*  CALCIUM 8.5* 8.9 9.4  PHOS 5.9* 5.3*  --     GFR: Estimated Creatinine Clearance: 18.7 mL/min (A) (by C-G formula based on SCr of 5.9 mg/dL (H)).  Liver Function Tests: Recent Labs  Lab 12/08/20 0848 12/11/20 0202 12/13/20 0545  AST  --   --  31  ALT  --   --  10  ALKPHOS  --   --  115  BILITOT  --   --  1.1  PROT  --   --  6.6  ALBUMIN 2.4* 2.3* 2.3*   No results for input(s): LIPASE, AMYLASE in the last 168 hours. No results for input(s): AMMONIA in the last 168 hours.  Coagulation Profile: No results for input(s): INR, PROTIME in the last 168 hours.  Cardiac Enzymes: Recent Labs  Lab 12/08/20 1145  CKTOTAL 24*     BNP (last 3 results) No results for input(s): PROBNP in the last 8760 hours.  Lipid Profile: No results for input(s): CHOL, HDL, LDLCALC, TRIG, CHOLHDL, LDLDIRECT in the last 72 hours.  Thyroid Function Tests: No results for input(s): TSH, T4TOTAL, FREET4, T3FREE, THYROIDAB in the last 72 hours.  Anemia Panel: No results for input(s): VITAMINB12, FOLATE, FERRITIN, TIBC, IRON, RETICCTPCT in the last 72 hours.  Urine analysis: No results found for: COLORURINE, APPEARANCEUR, LABSPEC, PHURINE, GLUCOSEU, HGBUR, BILIRUBINUR, KETONESUR, PROTEINUR, UROBILINOGEN, NITRITE, LEUKOCYTESUR  Sepsis Labs: Lactic Acid, Venous    Component Value Date/Time   LATICACIDVEN 1.9 12/03/2020 1444    MICROBIOLOGY: No results found for this or any previous visit (from the past 240 hour(s)).  RADIOLOGY STUDIES/RESULTS: No results found.   LOS: 11 days   Little Ishikawa, DO  [facility]  Secure chat 7a-7p  If after 7 PM please call on-call physician on [place]  12/14/2020, 7:17 AM

## 2020-12-14 NOTE — Progress Notes (Addendum)
Physical Therapy Treatment Patient Details Name: Kyle Walton MRN: [id number] DOB: 1955/10/23 Today's Date: 12/14/2020    History of Present Illness Pt is a 65 y.o. male who presented 5/27 with R leg cellulitis and sepsis. PMH: PAF, OSA, MI, ESRD on HD MWF, DM on insulin, s/p multiple toe amputations, morbid obesity, ESBL/VRE/MRSA and CAD.    PT Comments    Pt received in supine, agreeable to therapy session and with good participation in transfer training and supine/seated exercises for strengthening. Pt continues to report significant R thigh pain (no results yet from punch needle biopsy in chart review) so focus mainly on LLE exercises and also reinforced importance of pressure offloading frequently in chair/bed (every 2 hours in bed and 30 mins in chair) with demo of different techniques. Pt needing +53moA at most for bed mobility and standing to SEsmondx2 reps. BP elevated pre/post mobility and RN notified, lift pad and chair alarm in place for safety at end of session and alarm activated, pt agreeable to try sitting up in chair for 1 hour (RN notified he will need to get up by 12:45pm). Pt continues to benefit from PT services to progress toward functional mobility goals. Plan to trial stand pivot transfer with RW vs slide board transfer to drop arm chair next session if able. Continue to recommend SNF.   Follow Up Recommendations  SNF;Supervision/Assistance - 24 hour     Equipment Recommendations  3in1 (PT);Wheelchair (measurements PT);Wheelchair cushion (measurements PT);Hospital bed;Other (comment)    Recommendations for Other Services       Precautions / Restrictions Precautions Precautions: Fall;Other (comment) Precaution Comments: contact precautions Restrictions Weight Bearing Restrictions: No    Mobility  Bed Mobility Overal bed mobility: Needs Assistance Bed Mobility: Supine to Sit     Supine to sit: Mod assist;+2 for safety/equipment;+2 for physical assistance;HOB  elevated     General bed mobility comments: Able to assist in advancing LEs, assist to scoot and heavy assist to advance trunk. cues to use bed rails to assist.    Transfers Overall transfer level: Needs assistance Equipment used: Ambulation equipment used Transfers: Sit to/from Stand Sit to Stand: Mod assist;+2 safety/equipment;+2 physical assistance;From elevated surface         General transfer comment: Mod A x 2 for sit to stand in [place] good initiation to power up today. Able to stand from stedy pads at [place] Corner[place] x 2. transferred to recliner chair via [place]  Ambulation/Gait                 Stairs             Wheelchair Mobility    Modified Rankin (Stroke Patients Only)       Balance Overall balance assessment: Needs assistance Sitting-balance support: Bilateral upper extremity supported;Feet supported Sitting balance-Leahy Scale: Fair Sitting balance - Comments: able to sit statically while brushing teeth EOB   Standing balance support: Bilateral upper extremity supported;During functional activity Standing balance-Leahy Scale: Poor Standing balance comment: in Stedy, reliant on UE support needs heavy postural cues                            Cognition Arousal/Alertness: Awake/alert Behavior During Therapy: WFL for tasks assessed/performed Overall Cognitive Status: Within Functional Limits for tasks assessed  General Comments: pt with good participation and motivated to get OOB to sit up in chair      Exercises General Exercises - Lower Extremity Ankle Circles/Pumps: AROM;Both;10 reps;Seated Heel Slides: AAROM;Right;Left;5 reps Hip ABduction/ADduction: AAROM;Left;10 reps Other Exercises Other Exercises: seated BLE AROM: LAQ, ankle pumps x10 reps ea Other Exercises: STS x2 (from elevated heights) for strengthening    General Comments General comments (skin integrity, edema, etc.): BP  elevated seated, RN notified BP 161/124 (134) initially seated EOB and BP 165/133 (144) seated in chair; RR 23 rpm with seated tasks and SpO2 92-98% on RA; HR elevated 118-128 bpm with exertion (remains ~128 resting in chair, RN aware)      Pertinent Vitals/Pain Pain Assessment: Faces Faces Pain Scale: Hurts even more Pain Location: R lateral leg with movement, R thigh with touch Pain Descriptors / Indicators: Grimacing;Guarding;Moaning Pain Intervention(s): Limited activity within patient's tolerance;Monitored during session;Repositioned           PT Goals (current goals can now be found in the care plan section) Acute Rehab PT Goals Patient Stated Goal: get strength back PT Goal Formulation: With patient Time For Goal Achievement: 12/19/20 Potential to Achieve Goals: Good Progress towards PT goals: Progressing toward goals    Frequency    Min 2X/week      PT Plan Current plan remains appropriate    Co-evaluation PT/OT/SLP Co-Evaluation/Treatment: Yes Reason for Co-Treatment: Complexity of the patient's impairments (multi-system involvement);For patient/therapist safety;To address functional/ADL transfers PT goals addressed during session: Mobility/safety with mobility;Balance;Proper use of DME;Strengthening/ROM OT goals addressed during session: ADL's and self-care      AM-PAC PT "6 Clicks" Mobility   Outcome Measure  Help needed turning from your back to your side while in a flat bed without using bedrails?: A Lot Help needed moving from lying on your back to sitting on the side of a flat bed without using bedrails?: A Lot Help needed moving to and from a bed to a chair (including a wheelchair)?: A Lot Help needed standing up from a chair using your arms (e.g., wheelchair or bedside chair)?: A Lot Help needed to walk in hospital room?: Total Help needed climbing 3-5 steps with a railing? : Total 6 Click Score: 10    End of Session Equipment Utilized During  Treatment: Gait belt Activity Tolerance: Patient tolerated treatment well;Other (comment) (HR elevated but no sxs, just pain in R thigh) Patient left: in chair;with call bell/phone within reach;with chair alarm set;Other (comment) (heels floated, pt shown how to use button to recline more/raise trunk) Nurse Communication: Mobility status;Need for lift equipment;Patient requests pain meds (lift pad under him in chair, geomat cushion also in place) PT Visit Diagnosis: Muscle weakness (generalized) (M62.81);Difficulty in walking, not elsewhere classified (R26.2);Pain Pain - Right/Left: Right Pain - part of body: Leg (lateral thigh)     Time: [id number] PT Time Calculation (min) (ACUTE ONLY): 38 min  Charges:  $Therapeutic Exercise: 8-22 mins                     Devesh Monforte P., PTA Acute Rehabilitation Services Pager: [phone] Office: [place] 12/14/2020, 1:34 PM

## 2020-12-14 NOTE — Progress Notes (Signed)
Placed patient on bipap for the night with IPAP=12cm and EPAP 6cm and oxygen set at 2lpm

## 2020-12-14 NOTE — Plan of Care (Signed)
  Problem: Health Behavior/Discharge Planning: Goal: Ability to manage health-related needs will improve Outcome: Not Progressing   Problem: Activity: Goal: Risk for activity intolerance will decrease Outcome: Not Progressing   

## 2020-12-14 NOTE — Progress Notes (Signed)
[facility] NEPHROLOGY PROGRESS NOTE  Assessment/ Plan: Pt is a 65 y.o. yo male with HTN, CAD status post CABG, AICD, PAD with numerous toe amputation, ESRD on HD MWF admitted for right leg wound.  [facility] 4h 450/800 117kg 2K/3Ca bath TDC Hep 4900 + 500u/hr  #Right leg pain: MRI negative.  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  The calcium based binders and VDRL was discontinued and patient was started on sodium thiosulfate with dialysis MWF.  The biopsy was done on 6/2, follow-up results.  #Right LE cellulitis status post course of IV antibiotics, blood cultures negative.  # ESRD: MWF, tolerated dialysis well on 6/6 with 1 L UF.  Next HD on 6/8.  # Anemia: Hemoglobin at goal.  Continue ESA weekly.  # Secondary hyperparathyroidism: Calcium level acceptable.  We will do low calcium bath and avoid VDR A or calcium based binders.  Continue Renvela.  # HTN/volume: Monitor blood pressure.  On midodrine for intradialytic hypotension.  Subjective: Seen and examined.  He was wearing CPAP.  He tolerated dialysis well without any issues except requiring midodrine.  Today he denies chest pain, shortness of breath, nausea or vomiting.  No new event.   Objective Vital signs in last 24 hours: Vitals:   12/13/20 2350 12/14/20 0013 12/14/20 0600 12/14/20 0749  BP: (!) 101/49  (!) 100/58 97/71  Pulse: (!) 125  (!) 124 (!) 122  Resp: '20  20 19  '$ Temp: 97.7 F (36.5 C)  98.7 F (37.1 C) 97.9 F (36.6 C)  TempSrc: Axillary  Oral Oral  SpO2: 95%  100% 100%  Weight:  (!) 155.6 kg    Height:       Weight change: -4.176 kg  Intake/Output Summary (Last 24 hours) at 12/14/2020 0917 Last data filed at 12/14/2020 0844 Gross per 24 hour  Intake 403.37 ml  Output 1034 ml  Net -630.63 ml       Labs: Basic Metabolic Panel: Recent Labs  Lab 12/08/20 0848 12/11/20 0202 12/13/20 0545  NA 132* 132* 137  K 4.0 5.0 3.9  CL 97* 96*  95*  CO2 24 20* 24  GLUCOSE 114* 167* 160*  BUN 42* 48* 42*  CREATININE 6.65* 6.91* 5.90*  CALCIUM 8.5* 8.9 9.4  PHOS 5.9* 5.3*  --    Liver Function Tests: Recent Labs  Lab 12/08/20 0848 12/11/20 0202 12/13/20 0545  AST  --   --  31  ALT  --   --  10  ALKPHOS  --   --  115  BILITOT  --   --  1.1  PROT  --   --  6.6  ALBUMIN 2.4* 2.3* 2.3*   No results for input(s): LIPASE, AMYLASE in the last 168 hours. No results for input(s): AMMONIA in the last 168 hours. CBC: Recent Labs  Lab 12/08/20 0847 12/11/20 0202 12/13/20 0545  WBC 15.8* 19.2* 24.5*  HGB 10.1* 10.3* 10.6*  HCT 32.6* 32.1* 32.5*  MCV 94.5 90.2 89.5  PLT 197 251 248   Cardiac Enzymes: Recent Labs  Lab 12/08/20 1145  CKTOTAL 24*   CBG: Recent Labs  Lab 12/13/20 0611 12/13/20 1240 12/13/20 1616 12/13/20 2143 12/14/20 0635  GLUCAP 157* 149* 188* 161* 215*    Iron Studies: No results for input(s): IRON, TIBC, TRANSFERRIN, FERRITIN in the last 72 hours. Studies/Results: No results found.  Medications: Infusions: . sodium thiosulfate infusion for calciphylaxis Stopped (12/13/20 1246)    Scheduled  Medications: . acetaminophen  1,000 mg Oral Q8H  . amiodarone  200 mg Oral Daily  . apixaban  5 mg Oral BID  . atorvastatin  40 mg Oral Daily  . Chlorhexidine Gluconate Cloth  6 each Topical Q0600  . cholestyramine light  4 g Oral Q1400  . clopidogrel  75 mg Oral Daily  . darbepoetin (ARANESP) injection - DIALYSIS  60 mcg Intravenous Q Wed-HD  . docusate sodium  100 mg Oral BID  . insulin aspart  0-15 Units Subcutaneous TID WC  . insulin glargine  10 Units Subcutaneous QHS  . levothyroxine  12.5 mcg Oral QAC breakfast  . lidocaine   Topical TID  . metoprolol tartrate  12.5 mg Oral BID  . midodrine  10 mg Oral TID WC  . pantoprazole  40 mg Oral Daily  . polyethylene glycol  17 g Oral Daily  . pregabalin  25 mg Oral Daily  . senna-docusate  1 tablet Oral BID  . sevelamer carbonate  1,600 mg  Oral TID WC    have reviewed scheduled and prn medications.  Physical Exam: General:NAD, comfortable, on CPAP Heart:RRR, s1s2 nl Lungs: Basal rhonchi, no increased work of breathing Abdomen:soft, Non-tender, non-distended Extremities: Trace edema, right lateral thigh has dressing applied. Dialysis Access: TDC.  Kyle Walton 12/14/2020,9:17 AM  LOS: 11 days

## 2020-12-14 NOTE — Progress Notes (Signed)
Patient had two incontinent stools. Mushy in nature. Advised patient not to ask for any more enemas. Patient was in agreement. Will continue to monitor

## 2020-12-14 NOTE — Progress Notes (Signed)
Patient has dime size opening on forehead right at the hair line. Advised patient not to mess with it because he could get an infection. Area was cleansed and band aid placed. Will continue to monitor

## 2020-12-15 DIAGNOSIS — R21 Rash and other nonspecific skin eruption: Secondary | ICD-10-CM

## 2020-12-15 DIAGNOSIS — I480 Paroxysmal atrial fibrillation: Secondary | ICD-10-CM

## 2020-12-15 LAB — GLUCOSE, CAPILLARY
Glucose-Capillary: 166 mg/dL — ABNORMAL HIGH (ref 70–99)
Glucose-Capillary: 179 mg/dL — ABNORMAL HIGH (ref 70–99)
Glucose-Capillary: 180 mg/dL — ABNORMAL HIGH (ref 70–99)
Glucose-Capillary: 158 mg/dL — ABNORMAL HIGH (ref 70–99)

## 2020-12-15 LAB — RENAL FUNCTION PANEL
Albumin: 2.1 g/dL — ABNORMAL LOW (ref 3.5–5.0)
Anion gap: 13 (ref 5–15)
BUN: 49 mg/dL — ABNORMAL HIGH (ref 8–23)
CO2: 24 mmol/L (ref 22–32)
Calcium: 9 mg/dL (ref 8.9–10.3)
Chloride: 98 mmol/L (ref 98–111)
Creatinine, Ser: 5.1 mg/dL — ABNORMAL HIGH (ref 0.61–1.24)
GFR, Estimated: 12 mL/min — ABNORMAL LOW (ref >60)
Glucose, Bld: 241 mg/dL — ABNORMAL HIGH (ref 70–99)
Phosphorus: 4.4 mg/dL (ref 2.5–4.6)
Potassium: 4.1 mmol/L (ref 3.5–5.1)
Sodium: 135 mmol/L (ref 135–145)

## 2020-12-15 LAB — CBC
HCT: 30.2 % — ABNORMAL LOW (ref 39.0–52.0)
Hemoglobin: 9.6 g/dL — ABNORMAL LOW (ref 13.0–17.0)
MCH: 29 pg (ref 26.0–34.0)
MCHC: 31.8 g/dL (ref 30.0–36.0)
MCV: 91.2 fL (ref 80.0–100.0)
Platelets: 256 10*3/uL (ref 150–400)
RBC: 3.31 MIL/uL — ABNORMAL LOW (ref 4.22–5.81)
RDW: 17 % — ABNORMAL HIGH (ref 11.5–15.5)
WBC: 21.7 10*3/uL — ABNORMAL HIGH (ref 4.0–10.5)
nRBC: 0 % (ref 0.0–0.2)

## 2020-12-15 LAB — MRSA PCR SCREENING: MRSA by PCR: NEGATIVE

## 2020-12-15 MED ORDER — LIDOCAINE HCL (PF) 1 % IJ SOLN: 5.0000 mL | INTRAMUSCULAR | Status: DC | PRN

## 2020-12-15 MED ORDER — LIDOCAINE-PRILOCAINE 2.5-2.5 % EX CREA: 1.0000 "application " | TOPICAL_CREAM | CUTANEOUS | Status: DC | PRN

## 2020-12-15 MED ORDER — HEPARIN SODIUM (PORCINE) 1000 UNIT/ML DIALYSIS: 1000.0000 [IU] | INTRAMUSCULAR | Status: DC | PRN

## 2020-12-15 MED ORDER — DARBEPOETIN ALFA 60 MCG/0.3ML IJ SOSY
PREFILLED_SYRINGE | INTRAMUSCULAR | Status: AC
  Administered 2020-12-15: 60 ug via INTRAVENOUS
  Filled 2020-12-15: qty 0.3

## 2020-12-15 MED ORDER — HEPARIN SODIUM (PORCINE) 1000 UNIT/ML DIALYSIS: 20.0000 [IU]/kg | INTRAMUSCULAR | Status: DC | PRN

## 2020-12-15 MED ORDER — SODIUM CHLORIDE 0.9 % IV SOLN: 100.0000 mL | INTRAVENOUS | Status: DC | PRN

## 2020-12-15 MED ORDER — ALTEPLASE 2 MG IJ SOLR: 2.0000 mg | Freq: Once | INTRAMUSCULAR | Status: DC | PRN

## 2020-12-15 MED ORDER — ALBUMIN HUMAN 25 % IV SOLN
INTRAVENOUS | Status: AC
  Administered 2020-12-15: 25 g
  Filled 2020-12-15: qty 100

## 2020-12-15 MED ORDER — HEPARIN SODIUM (PORCINE) 1000 UNIT/ML IJ SOLN
INTRAMUSCULAR | Status: AC
  Filled 2020-12-15: qty 5

## 2020-12-15 MED ORDER — PENTAFLUOROPROP-TETRAFLUOROETH EX AERO: 1.0000 "application " | INHALATION_SPRAY | CUTANEOUS | Status: DC | PRN

## 2020-12-15 NOTE — Consult Note (Signed)
[place] for Infectious Disease    Date of Admission:  12/03/2020     Total days of antibiotics 7               Reason for Consult: Cellulitis   Referring Provider: [facility] Primary Care Provider: Patient, No Pcp Per (Inactive)   ASSESSMENT:  Kyle Walton is a 65 y/o male with multiple medical problems including ESRD on HD presenting with right leg/thigh pain and weakness of unclear origin. Imaging with soft tissue edema and no fluid collections/abscesses. Has completed vancomycin and cefepime with concern for cellulitis and has not been on antibiotics for the past week. Biopsy of area with neutrophil infiltration. Leukocytosis and rash has improved since initiation of sodium thiosulfate for potential calciphylaxis. Does not appear consistent with cellulitis and would suspect he would have had worsening symptoms instead of the improvements seen thus far. Recommend to continue with sodium thiosulfate and hold off on any additional antibiotics at this point.   PLAN:  1. Continue to monitor off antibiotics. 2. Continue sodium thiosulfate per Nephrology given his gradual improvements. 3. Monitor leukocytosis as it appears to be improving.    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 acetaminophen  1,000 mg Oral Q8H  . amiodarone  200 mg Oral Daily  . apixaban  5 mg Oral BID  . atorvastatin  40 mg Oral Daily  . Chlorhexidine Gluconate Cloth  6 each Topical Q0600  . Chlorhexidine Gluconate Cloth  6 each Topical Q0600  . cholestyramine light  4 g Oral Q1400  . clopidogrel  75 mg Oral Daily  . darbepoetin (ARANESP) injection -  DIALYSIS  60 mcg Intravenous Q Wed-HD  . docusate sodium  100 mg Oral BID  . heparin sodium (porcine)      . insulin aspart  0-15 Units Subcutaneous TID WC  . insulin glargine  10 Units Subcutaneous QHS  . levothyroxine  12.5 mcg Oral QAC breakfast  . lidocaine   Topical TID  . metoprolol tartrate  12.5 mg Oral BID  . midodrine  10 mg Oral TID WC  . pantoprazole  40 mg Oral Daily  . polyethylene glycol  17 g Oral Daily  . pregabalin  25 mg Oral Daily  . senna-docusate  1 tablet Oral BID  . sevelamer carbonate  1,600 mg Oral TID WC     HPI: Kyle Walton is a 65 y.o. male with previous medical history of paroxysmal atrial fibrillation, ESRD on hemodialysis, Type 2 diabetes, sleep apnea and s/p multiple toe amputations admitted with 3 day history of leg pain.   Kyle Walton was scheduled for dialysis on the date of presentation and was unable to attend because of right leg and thigh pain along with generalized weakness. Initial x-rays on 5/27 were negative for acute findings. CT right femur on 5/29 with no acute osseous injury and soft tissue edema and skin thickening along the lateral aspect of the right thigh and circumferential subcutaneous edema likely reflecting edema secondary to fluid overload. Initial blood cultures from 5/27 finalized without growth.   Kyle Walton has  been afebrile since admission and has had on-going leukocytosis around 15 until 6/4 when it began increasing up to a peak of 24.5 on 6/5 with most recent of 21.7. Has received antibiotics including vancomycin (5/27-6/1) and cefepime (5/27-5/29). Concern arose on 6/1 for diabetic amyotrophy with recommendation for MRI. MRI of cervical/thoracic/lumbar spine without cord compression, discitis or epidural abscess. MRI pelvis with non-specific generalized soft tissue edema suggesting anasarca. General Surgery consulted for rash with biopsy performed showing subepidermal edema with diffuse neutrophilic infiltrate involving the dermis  extending into the subcutis. No vasculitis seen. FAS staining was negative for fungus. Started on sodium thiosulfate for potential of calciphylaxis.   Kyle Walton is not currently on antibiotics having finished last dose on 6/1. WBC count appears to be improving slowly. Continues to have pain primarily around the site. Experiencing slow, gradual improvements. Denies fevers/chills. Has had ongoing cough for the last 4+ years.  Review of Systems: Review of Systems  Constitutional: Negative for chills, fever and weight loss.  Respiratory: Negative for cough, shortness of breath and wheezing.   Cardiovascular: Positive for leg swelling (and pain of right lateral thigh). Negative for chest pain.  Gastrointestinal: Negative for abdominal pain, constipation, diarrhea, nausea and vomiting.  Skin: Negative for rash.     Past Medical History:  Diagnosis Date  . Diabetes mellitus without complication ([place])   . Diastolic heart failure ([place])   . ESRD (end stage renal disease) ([place])   . MI (myocardial infarction) ([place])   . OSA (obstructive sleep apnea)   . PAF (paroxysmal atrial fibrillation) ([place])   . Renal disorder    on dialysis    Social History   Tobacco Use  . Smoking status: Never Smoker  . Smokeless tobacco: Never Used  Substance Use Topics  . Alcohol use: Not Currently  . Drug use: Not Currently    History reviewed. No pertinent family history.  Allergies  Allergen Reactions  . Morphine Other (See Comments)    Per son, [name], pt becomes unresponsive/disoriented. Especially when given after HD tx  . Liraglutide Diarrhea    Phenol Phenol     OBJECTIVE: Blood pressure 115/73, pulse (!) 105, temperature 98.3 F (36.8 C), temperature source Oral, resp. rate 18, height '5\' 10"'$  (1.778 m), weight 133.2 kg, SpO2 98 %.  Physical Exam Constitutional:      General: He is not in acute distress.    Appearance: He is well-developed. He is obese.     Comments: Lying in bed with head  of bed elevated; pleasant.   Cardiovascular:     Rate and Rhythm: Normal rate and regular rhythm.     Heart sounds: Normal heart sounds.  Pulmonary:     Effort: Pulmonary effort is normal.     Breath sounds: Normal breath sounds.  Skin:    General: Skin is warm and dry.     Comments: Right thigh with edematous area primarily on the lateral aspect with peau d'orange appearance. Tender and very tight feeling. Appears to be smaller pin head sized areas throughout.   Neurological:     Mental Status: He is alert and oriented to person, place, and time.  Psychiatric:        Mood and Affect: Mood normal.     Lab Results Lab Results  Component Value Date   WBC 21.7 (H) 12/15/2020   HGB 9.6 (L) 12/15/2020   HCT 30.2 (L) 12/15/2020   MCV 91.2 12/15/2020   PLT 256 12/15/2020    Lab  Results  Component Value Date   CREATININE 5.10 (H) 12/15/2020   BUN 49 (H) 12/15/2020   NA 135 12/15/2020   K 4.1 12/15/2020   CL 98 12/15/2020   CO2 24 12/15/2020    Lab Results  Component Value Date   ALT 10 12/13/2020   AST 31 12/13/2020   ALKPHOS 115 12/13/2020   BILITOT 1.1 12/13/2020     Microbiology: Recent Results (from the past 240 hour(s))  MRSA PCR Screening     Status: None   Collection Time: 12/15/20  8:23 AM   Specimen: Nasopharyngeal  Result Value Ref Range Status   MRSA by PCR NEGATIVE NEGATIVE Final    Comment:        The GeneXpert MRSA Assay (FDA approved for NASAL specimens only), is one component of a comprehensive MRSA colonization surveillance program. It is not intended to diagnose MRSA infection nor to guide or monitor treatment for MRSA infections. Performed at [facility], [place] [address] [place], [place] [address]      Terri Piedra, [place] for Infectious Disease [facility]  12/15/2020  11:42 AM

## 2020-12-15 NOTE — Progress Notes (Signed)
PROGRESS NOTE        PATIENT DETAILS Name: Kyle Walton Age: 65 y.o. Sex: male Date of Birth: [birth year] Admit Date: 12/03/2020 Admitting Physician Neena Rhymes, MD [id number], No Pcp Per (Inactive)  Brief Narrative: Patient is a 65 y.o. male PAF, CAD s/p CABG, AICD implantation, PAD-s/p numerous toe amputations, HTN, ESRD on HD MWF-admitted for right leg soft tissue infection and right upper thigh pain.  See below for further details.  Significant events: 5/27>> admit for RLE pain/right leg cellulitis. 5/29>> lethargy/encephalopathy-significant improvement post Narcan 6/2>> skin biopsy-right thigh by general surgery  Significant studies: 5/27>> x-ray right foot: No evidence of fracture/focal bone lesions. 5/27>> chest x-ray: Mild pulmonary vascular congestion. 5/29>> RLE Doppler: No DVT 5/29>> CT right lower extremity: Soft tissue edema-no drainable fluid collection.   6/1>> MRI C-spine/T-spine/L-spine: No discitis/osteomyelitis/epidural collection. 6/1>> MRI pelvis: Soft tissue edema in pelvis/proximal thigh.  No focal fluid collection identified  Antimicrobial therapy: Vancomycin: 5/27>> 6/1 Cefepime: 5/27>>5/29  Microbiology data: 5/27>> Influenza/COVID PCR: Negative 5/27>> blood culture: No growth 5/28>> blood culture:No growth  Procedures : 6/2>> punch biopsy/skin biopsy of right lateral thigh by general surgery; pathology sent to derm for further testing/evaluation - results showed subepidermal edema diffuse neutrophilic infiltrate without diagnostic vasculitis fungal hyphae.   Consults: Nephrology, neurology, general surgery  DVT Prophylaxis :  apixaban (ELIQUIS) tablet 5 mg    Subjective: No acute issues or events overnight, tolerating dialysis well today indicates right lower extremity sensitivity and pain is improving otherwise denies constipation headache fevers chills nausea vomiting diarrhea shortness of breath or chest  pain.  Assessment/Plan:  Sepsis due to RLE cellulitis:  - Sepsis physiology resolving-significant improvement in erythema around his RLE wounds.   - Blood cultures are negative so far.   - Completed a course of vancomycin/cefepime.  See below.  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 -punch biopsy as above -Placed on sodium thiosulfate with dialysis Monday Wednesday Friday per nephrology -Neurology following, appreciate insight and recommendations -Narcotics previously discontinued after multiple issues with an episode of lethargy requiring Narcan last week -Gabapentin discontinued due to previous episodes of encephalopathy -Topical lidocaine a very little improvement, Lyrica ongoing  Lower extremity weakness/acute on chronic ambulatory dysfunction:  -Right greater than left - appears to be secondary to pain and not a mechanical/impingement issue  -He reports bilateral feet numbness today but MRI of the spine has been unremarkable  -No further recommendations from neurology  -PT/OT ongoing, ambulation issues appears to be secondary to pain rather than functional issue  Acute toxic encephalopathy, resolved:  - 5/29 -patient was given 1 dose of Narcan.  Continue to hold off on narcotics  ESRD on HD MWF: Nephrology following.  HTN: BP on the softer/lower side-continue midodrine- low-dose metoprolol.    Chronic diastolic heart failure: Volume status  relatively stable-diuresis with HD.  CAD-s/p CABG 2017: No anginal symptoms-on Plavix/statin/beta-blocker  History of [id number] amiodarone, metoprolol and Eliquis.  History of AICD  implantation, stable  History of PAD-s/p multiple toe amputations bilaterally, stable  DM-2, uncontrolled with hyperglycemia: CBG stable-has very poor appetite-liberalize diet, allow permissive hyperglycemia.  Lantus continued at 10 units daily-remains on SSI.    Hypothyroidism: Continue Synthroid  OSA: Continue CPAP nightly  Legally blind  Morbid Obesity: Estimated body mass index is 49.22 kg/m as calculated from the following:   Height as of this encounter: '5\' 10"'$  (1.778 m).   Weight as of this encounter: 155.6 kg.    Diet: Diet Order            Diet renal/carb modified with fluid restriction Diet-HS Snack? Nothing; Fluid restriction: 1200 mL Fluid; Room service appropriate? Yes with Assist; Fluid consistency: Thin  Diet effective now                  Code Status: Full code   Family Communication: Spouse to be updated by patient per discussion today  Disposition Plan: Status is: Inpatient  Remains inpatient appropriate because:Inpatient level of care appropriate due to severity of illness   Dispo: The patient is from: Home              Anticipated d/c is to: SNF              Patient currently stable for discharge   Difficult to place patient No    Barriers to Discharge: Bed availability  Antimicrobial agents: Anti-infectives (From admission, onward)   Start     Dose/Rate Route Frequency Ordered Stop   12/08/20 1007  vancomycin (VANCOCIN) 1-5 GM/200ML-% IVPB       Note to Pharmacy: Judieth Keens  : cabinet override      12/08/20 1007 12/08/20 1102   12/06/20 1203  vancomycin (VANCOCIN) 1-5 GM/200ML-% IVPB       Note to Pharmacy: Wallace Cullens   : cabinet override      12/06/20 1203 12/06/20 1206   12/04/20 1030  vancomycin (VANCOREADY) IVPB 1000 mg/200 mL         1,000 mg 200 mL/hr over 60 Minutes Intravenous  Once 12/04/20 0930 12/04/20 1230   12/03/20 2300  vancomycin (VANCOREADY) IVPB 1000 mg/200 mL        1,000 mg 200 mL/hr over 60 Minutes Intravenous Every M-W-F (Hemodialysis) 12/03/20 2019 12/09/20 2359   12/03/20 1830  ceFEPIme (MAXIPIME) 1 g in sodium chloride 0.9 % 100 mL IVPB  Status:  Discontinued        1 g 200 mL/hr over 30 Minutes Intravenous Every 24 hours 12/03/20 1802 12/05/20 1630   12/03/20 1800  vancomycin (VANCOREADY) IVPB 1000 mg/200 mL  Status:  Discontinued        1,000 mg 200 mL/hr over 60 Minutes Intravenous  Once 12/03/20 1756 12/03/20 1801   12/03/20 1300  vancomycin (VANCOREADY) IVPB 1750 mg/350 mL        1,750 mg 175 mL/hr over 120 Minutes Intravenous  Once 12/03/20 1252 12/03/20 1706   12/03/20 1300  cefTRIAXone (ROCEPHIN) 2 g in sodium chloride 0.9 % 100 mL IVPB        2 g 200 mL/hr over 30 Minutes Intravenous  Once 12/03/20 1252 12/03/20 1345       Time spent: 35 minutes-Greater than 50% of this time was spent in counseling, explanation of diagnosis, planning of further management, and coordination of care.  MEDICATIONS: Scheduled Meds: . acetaminophen  1,000 mg Oral Q8H  . amiodarone  200 mg Oral Daily  . apixaban  5 mg Oral BID  .  atorvastatin  40 mg Oral Daily  . Chlorhexidine Gluconate Cloth  6 each Topical Q0600  . Chlorhexidine Gluconate Cloth  6 each Topical Q0600  . cholestyramine light  4 g Oral Q1400  . clopidogrel  75 mg Oral Daily  . darbepoetin (ARANESP) injection - DIALYSIS  60 mcg Intravenous Q Wed-HD  . docusate sodium  100 mg Oral BID  . insulin aspart  0-15 Units Subcutaneous TID WC  . insulin glargine  10 Units Subcutaneous QHS  . levothyroxine  12.5 mcg Oral QAC breakfast  . lidocaine   Topical TID  . metoprolol tartrate  12.5 mg Oral BID  . midodrine  10 mg Oral TID WC  . pantoprazole  40 mg Oral Daily  . polyethylene glycol  17 g Oral Daily  . pregabalin  25 mg Oral Daily   . senna-docusate  1 tablet Oral BID  . sevelamer carbonate  1,600 mg Oral TID WC   Continuous Infusions: . sodium thiosulfate infusion for calciphylaxis Stopped (12/13/20 1246)   PRN Meds:.bisacodyl, hydrOXYzine, naLOXone (NARCAN)  injection, nitroGLYCERIN, ondansetron (ZOFRAN) IV   PHYSICAL EXAM: Vital signs: Vitals:   12/14/20 1844 12/14/20 2106 12/14/20 2320 12/15/20 0429  BP: 122/68 94/66  98/67  Pulse: (!) 114 (!) 125 88 (!) 123  Resp: '19 20 18 18  '$ Temp: 98 F (36.7 C) 97.7 F (36.5 C)  (!) 97.1 F (36.2 C)  TempSrc: Oral Oral  Oral  SpO2: 96% 94% 98% 95%  Weight:      Height:       Filed Weights   12/13/20 0718 12/13/20 1134 12/14/20 0013  Weight: (!) 150.5 kg (!) 149 kg (!) 155.6 kg   Body mass index is 49.22 kg/m.   Gen Exam:Alert awake-not in any distress HEENT:atraumatic, normocephalic Chest: B/L clear to auscultation anteriorly CVS:S1S2 regular Abdomen:soft non tender, non distended Neurology: Non focal - generalized weakness 4 out of 5, right lower extremity 3 out of 5 limited by pain Skin: RLE lateral blanching erythema laterally and proximal to the knee -improving in size/sensitivity over the past 48 hours  I have personally reviewed following labs and imaging studies  LABORATORY DATA: CBC: Recent Labs  Lab 12/08/20 0847 12/11/20 0202 12/13/20 0545  WBC 15.8* 19.2* 24.5*  HGB 10.1* 10.3* 10.6*  HCT 32.6* 32.1* 32.5*  MCV 94.5 90.2 89.5  PLT 197 251 [id number]    Basic Metabolic Panel: Recent Labs  Lab 12/08/20 0848 12/11/20 0202 12/13/20 0545  NA 132* 132* 137  K 4.0 5.0 3.9  CL 97* 96* 95*  CO2 24 20* 24  GLUCOSE 114* 167* 160*  BUN 42* 48* 42*  CREATININE 6.65* 6.91* 5.90*  CALCIUM 8.5* 8.9 9.4  PHOS 5.9* 5.3*  --     GFR: Estimated Creatinine Clearance: 18.7 mL/min (A) (by C-G formula based on SCr of 5.9 mg/dL (H)).  Liver Function Tests: Recent Labs  Lab 12/08/20 0848 12/11/20 0202 12/13/20 0545  AST  --   --  31  ALT  --    --  10  ALKPHOS  --   --  115  BILITOT  --   --  1.1  PROT  --   --  6.6  ALBUMIN 2.4* 2.3* 2.3*   No results for input(s): LIPASE, AMYLASE in the last 168 hours. No results for input(s): AMMONIA in the last 168 hours.  Coagulation Profile: No results for input(s): INR, PROTIME in the last 168 hours.  Cardiac Enzymes: Recent Labs  Lab  12/08/20 1145  CKTOTAL 24*    BNP (last 3 results) No results for input(s): PROBNP in the last 8760 hours.  Lipid Profile: No results for input(s): CHOL, HDL, LDLCALC, TRIG, CHOLHDL, LDLDIRECT in the last 72 hours.  Thyroid Function Tests: No results for input(s): TSH, T4TOTAL, FREET4, T3FREE, THYROIDAB in the last 72 hours.  Anemia Panel: No results for input(s): VITAMINB12, FOLATE, FERRITIN, TIBC, IRON, RETICCTPCT in the last 72 hours.  Urine analysis: No results found for: COLORURINE, APPEARANCEUR, LABSPEC, PHURINE, GLUCOSEU, HGBUR, BILIRUBINUR, KETONESUR, PROTEINUR, UROBILINOGEN, NITRITE, LEUKOCYTESUR  Sepsis Labs: Lactic Acid, Venous    Component Value Date/Time   LATICACIDVEN 1.9 12/03/2020 1444    MICROBIOLOGY: No results found for this or any previous visit (from the past 240 hour(s)).  RADIOLOGY STUDIES/RESULTS: No results found.   LOS: 12 days   Little Ishikawa, DO  [facility]  Secure chat 7a-7p  If after 7 PM please call on-call physician on amion  12/15/2020, 7:50 AM

## 2020-12-15 NOTE — Procedures (Signed)
Patient was seen on dialysis and the procedure was supervised.  BFR 450  Via TDC BP is  103/65. UF goal 2 L as tolerated by BP.    Patient appears to be tolerating treatment well.   Kyle Walton 12/15/2020

## 2020-12-15 NOTE — Progress Notes (Signed)
Pt already wearing CPAP and resting comfortably. RT will continue to monitor.

## 2020-12-15 NOTE — Progress Notes (Signed)
   12/15/20 2053  Assess: MEWS Score  Temp 98.6 F (37 C)  BP 120/75  Pulse Rate (!) 127  Resp 20  Level of Consciousness Alert  SpO2 100 %  O2 Device CPAP  Assess: MEWS Score  MEWS Temp 0  MEWS Systolic 0  MEWS Pulse 2  MEWS RR 0  MEWS LOC 0  MEWS Score 2  MEWS Score Color Yellow   Patient alert, oriented, without complaints or distress. Previous history of tachycardia noted. No changes to plan of care. Will continue to monitor.

## 2020-12-16 LAB — BASIC METABOLIC PANEL
Anion gap: 20 — ABNORMAL HIGH (ref 5–15)
BUN: 31 mg/dL — ABNORMAL HIGH (ref 8–23)
CO2: 22 mmol/L (ref 22–32)
Calcium: 9 mg/dL (ref 8.9–10.3)
Chloride: 95 mmol/L — ABNORMAL LOW (ref 98–111)
Creatinine, Ser: 3.95 mg/dL — ABNORMAL HIGH (ref 0.61–1.24)
GFR, Estimated: 16 mL/min — ABNORMAL LOW (ref >60)
Glucose, Bld: 198 mg/dL — ABNORMAL HIGH (ref 70–99)
Potassium: 3.7 mmol/L (ref 3.5–5.1)
Sodium: 137 mmol/L (ref 135–145)

## 2020-12-16 LAB — CBC
HCT: 31.7 % — ABNORMAL LOW (ref 39.0–52.0)
Hemoglobin: 10.2 g/dL — ABNORMAL LOW (ref 13.0–17.0)
MCH: 29 pg (ref 26.0–34.0)
MCHC: 32.2 g/dL (ref 30.0–36.0)
MCV: 90.1 fL (ref 80.0–100.0)
Platelets: 257 10*3/uL (ref 150–400)
RBC: 3.52 MIL/uL — ABNORMAL LOW (ref 4.22–5.81)
RDW: 17.2 % — ABNORMAL HIGH (ref 11.5–15.5)
WBC: 20.2 10*3/uL — ABNORMAL HIGH (ref 4.0–10.5)
nRBC: 0 % (ref 0.0–0.2)

## 2020-12-16 LAB — GLUCOSE, CAPILLARY
Glucose-Capillary: 204 mg/dL — ABNORMAL HIGH (ref 70–99)
Glucose-Capillary: 239 mg/dL — ABNORMAL HIGH (ref 70–99)
Glucose-Capillary: 183 mg/dL — ABNORMAL HIGH (ref 70–99)
Glucose-Capillary: 169 mg/dL — ABNORMAL HIGH (ref 70–99)

## 2020-12-16 LAB — PHOSPHORUS: Phosphorus: 3.2 mg/dL (ref 2.5–4.6)

## 2020-12-16 MED ORDER — CHLORHEXIDINE GLUCONATE CLOTH 2 % EX PADS
6.0000 | MEDICATED_PAD | Freq: Every day | CUTANEOUS | Status: DC
  Administered 2020-12-16 – 2020-12-30 (×13): 6 via TOPICAL

## 2020-12-16 MED ORDER — INSULIN GLARGINE 100 UNIT/ML ~~LOC~~ SOLN
15.0000 [IU] | Freq: Every day | SUBCUTANEOUS | Status: DC
  Administered 2020-12-16 – 2020-12-31 (×15): 15 [IU] via SUBCUTANEOUS
  Filled 2020-12-16 (×16): qty 0.15

## 2020-12-16 NOTE — Progress Notes (Signed)
PROGRESS NOTE    Dederick Lipson  [id number] DOB: 1955-09-09 DOA: 12/03/2020 PCP: Patient, No Pcp Per (Inactive)   Brief Narrative:  Patient is a 65 y.o. male PAF, CAD s/p CABG, AICD implantation, PAD-s/p numerous toe amputations, HTN, ESRD on HD MWF-admitted for right leg soft tissue infection and right upper thigh pain.  See below for further details.  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   - Blood cultures are negative so far.   - Completed a course of vancomycin/cefepime.  See below.   Right lateral thigh pain with worsening erythema: imaging unremarkable for underlying abscess, DVT negative -Unclear etiology biopsy as above shows diffuse neutrophilic infiltrate without clear etiology -ID consulted to further evaluate for possible antibiotic needs however given patient's clinical improvement and completion of previous antibiotics and only ongoing leukocytosis as sign of infection. Punch biopsy shows subepidermal edema with diffuse neutrophilic infiltrate involving the dermis extending into the subcutis.  Diagnostic vasculitis is not seen.  PAS staining is negative for fungal hi-fi.  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 -Placed on sodium thiosulfate with dialysis Monday Wednesday Friday per nephrology -Narcotics previously discontinued after multiple issues with an episode of lethargy requiring Narcan last week -Gabapentin  discontinued due to previous episodes of encephalopathy -Topical lidocaine a very little improvement, Lyrica ongoing   Lower extremity weakness/acute on chronic ambulatory dysfunction:  -Right greater than left - appears to be secondary to pain and not a mechanical/impingement issue -No further recommendations from neurology -PT/OT ongoing, ambulation issues appears to be secondary to pain rather than functional issue.  PT OT recommends SNF.  When discussed in progression today, TOC not aware of those recommendations.  Informed TOC that patient is medically ready for discharge once bed is available at SNF.   Acute toxic encephalopathy, resolved: - 5/29 -patient was given 1 dose of Narcan.  Continue to hold off on narcotics   ESRD on HD MWF: Nephrology following.   HTN: BP on the softer/lower side-continue midodrine- low-dose metoprolol.     Chronic diastolic heart failure: Volume status relatively stable-diuresis with HD.   CAD-s/p CABG 2017: No anginal symptoms-on Plavix/statin/beta-blocker   History of [id number] amiodarone, metoprolol and Eliquis.   History of AICD implantation, stable   History of PAD-s/p multiple toe amputations bilaterally, stable   DM-2, uncontrolled with hyperglycemia: Blood sugar elevated.  Increase Lantus to 15 units starting tonight and continue SSI.   Hypothyroidism: Continue Synthroid   OSA: Continue CPAP nightly   Legally blind   Morbid Obesity: Estimated body mass index is 49.22 kg/m as calculated from the following:   Height as of this encounter: '5\' 10"'$  (1.778 m).   Weight as of this encounter: 155.6 kg.       DVT prophylaxis:    Code Status: Full Code  Family Communication: None present at bedside.  Plan of care discussed with patient in length and he verbalized understanding and agreed with it.  Status is: Inpatient  Remains inpatient appropriate because:Unsafe d/c plan  Dispo: The patient is from: Home              Anticipated  d/c is to: SNF              Patient currently is medically stable to d/c.   Difficult to place patient No        Estimated body mass index is 41.44 kg/m as calculated from the following:   Height as of this encounter: '5\' 10"'$  (1.778 m).   Weight as of this encounter: 131 kg.  Pressure Injury 12/11/20 Sacrum Right;Left;Mid Stage 2 -  Partial thickness loss of dermis presenting as a shallow open injury with a red, pink wound bed without slough. (Active)  12/11/20 0551  Location: Sacrum  Location Orientation: Right;Left;Mid  Staging: Stage 2 -  Partial thickness loss of dermis presenting as a shallow open injury with a red, pink wound bed without slough.  Wound Description (Comments):   Present on Admission:      Nutritional status:               Consultants:  Nephrology ID  Procedures:  Punch biopsy  Antimicrobials:  Anti-infectives (From admission, onward)    Start     Dose/Rate Route Frequency Ordered Stop   12/08/20 1007  vancomycin (VANCOCIN) 1-5 GM/200ML-% IVPB       Note to Pharmacy: Judieth Keens  : cabinet override      12/08/20 1007 12/08/20 1102   12/06/20 1203  vancomycin (VANCOCIN) 1-5 GM/200ML-% IVPB       Note to Pharmacy: Wallace Cullens   : cabinet override      12/06/20 1203 12/06/20 1206   12/04/20 1030  vancomycin (VANCOREADY) IVPB 1000 mg/200 mL        1,000 mg 200 mL/hr over 60 Minutes Intravenous  Once 12/04/20 0930 12/04/20 1230   12/03/20 2300  vancomycin (VANCOREADY) IVPB 1000 mg/200 mL        1,000 mg 200 mL/hr over 60 Minutes Intravenous Every M-W-F (Hemodialysis) 12/03/20 2019 12/09/20 2359   12/03/20 1830  ceFEPIme (MAXIPIME) 1 g in sodium chloride 0.9 % 100 mL IVPB  Status:  Discontinued        1 g 200 mL/hr over 30 Minutes Intravenous Every 24 hours 12/03/20 1802 12/05/20 1630   12/03/20 1800  vancomycin (VANCOREADY) IVPB 1000 mg/200 mL  Status:  Discontinued        1,000 mg 200 mL/hr over 60 Minutes Intravenous  Once  12/03/20 1756 12/03/20 1801   12/03/20 1300  vancomycin (VANCOREADY) IVPB 1750 mg/350 mL        1,750 mg 175 mL/hr over 120 Minutes Intravenous  Once 12/03/20 1252 12/03/20 1706   12/03/20 1300  cefTRIAXone (ROCEPHIN) 2 g in sodium chloride 0.9 % 100 mL IVPB        2 g 200 mL/hr over 30 Minutes Intravenous  Once 12/03/20 1252 12/03/20 1345          Subjective: Patient seen and examined.  He has no new complaint except pain in the right thigh.  Objective: Vitals:   12/15/20 1741 12/15/20 2053 12/16/20 0519 12/16/20 0924  BP: 125/86 120/75 119/67 115/78  Pulse: (!) 105 (!) 127 (!) 116 83  Resp: '20 20 19 18  '$ Temp:  98.6 F (37 C) 98.3 F (36.8 C) 98.6 F (37 C)  TempSrc:  Oral Oral Oral  SpO2: 93% 100% 100% (!) 87%  Weight:  Height:        Intake/Output Summary (Last 24 hours) at 12/16/2020 1351 Last data filed at 12/16/2020 0900 Gross per 24 hour  Intake 1156.03 ml  Output 0 ml  Net 1156.03 ml   Filed Weights   12/14/20 0013 12/15/20 0830 12/15/20 1240  Weight: (!) 155.6 kg 133.2 kg 131 kg    Examination:  General exam: Appears calm and comfortable, obese Respiratory system: Clear to auscultation. Respiratory effort normal. Cardiovascular system: S1 & S2 heard, RRR. No JVD, murmurs, rubs, gallops or clicks. No pedal edema. Gastrointestinal system: Abdomen is nondistended, soft and nontender. No organomegaly or masses felt. Normal bowel sounds heard. Central nervous system: Alert and oriented. No focal neurological deficits. Extremities: Mild erythema on the lateral side of the right thigh which is tender to palpation but not warm. Skin: Has dressing in the right lower leg. Psychiatry: Judgement and insight appear normal. Mood & affect appropriate.    Data Reviewed: I have personally reviewed following labs and imaging studies  CBC: Recent Labs  Lab 12/11/20 0202 12/13/20 0545 12/15/20 0836 12/16/20 0602  WBC 19.2* 24.5* 21.7* 20.2*  HGB 10.3* 10.6* 9.6*  10.2*  HCT 32.1* 32.5* 30.2* 31.7*  MCV 90.2 89.5 91.2 90.1  PLT 251 248 256 [id number]   Basic Metabolic Panel: Recent Labs  Lab 12/11/20 0202 12/13/20 0545 12/15/20 0836 12/16/20 0602 12/16/20 0937  NA 132* 137 135 137  --   K 5.0 3.9 4.1 3.7  --   CL 96* 95* 98 95*  --   CO2 20* '24 24 22  '$ --   GLUCOSE 167* 160* 241* 198*  --   BUN 48* 42* 49* 31*  --   CREATININE 6.91* 5.90* 5.10* 3.95*  --   CALCIUM 8.9 9.4 9.0 9.0  --   PHOS 5.3*  --  4.4  --  3.2   GFR: Estimated Creatinine Clearance: 25.4 mL/min (A) (by C-G formula based on SCr of 3.95 mg/dL (H)). Liver Function Tests: Recent Labs  Lab 12/11/20 0202 12/13/20 0545 12/15/20 0836  AST  --  31  --   ALT  --  10  --   ALKPHOS  --  115  --   BILITOT  --  1.1  --   PROT  --  6.6  --   ALBUMIN 2.3* 2.3* 2.1*   No results for input(s): LIPASE, AMYLASE in the last 168 hours. No results for input(s): AMMONIA in the last 168 hours. Coagulation Profile: No results for input(s): INR, PROTIME in the last 168 hours. Cardiac Enzymes: No results for input(s): CKTOTAL, CKMB, CKMBINDEX, TROPONINI in the last 168 hours. BNP (last 3 results) No results for input(s): PROBNP in the last 8760 hours. HbA1C: No results for input(s): HGBA1C in the last 72 hours. CBG: Recent Labs  Lab 12/15/20 1351 12/15/20 1741 12/15/20 2057 12/16/20 0624 12/16/20 1147  GLUCAP 179* 180* 158* 204* 239*   Lipid Profile: No results for input(s): CHOL, HDL, LDLCALC, TRIG, CHOLHDL, LDLDIRECT in the last 72 hours. Thyroid Function Tests: No results for input(s): TSH, T4TOTAL, FREET4, T3FREE, THYROIDAB in the last 72 hours. Anemia Panel: No results for input(s): VITAMINB12, FOLATE, FERRITIN, TIBC, IRON, RETICCTPCT in the last 72 hours. Sepsis Labs: No results for input(s): PROCALCITON, LATICACIDVEN in the last 168 hours.  Recent Results (from the past 240 hour(s))  MRSA PCR Screening     Status: None   Collection Time: 12/15/20  8:23 AM   Specimen:  Nasopharyngeal  Result  Value Ref Range Status   MRSA by PCR NEGATIVE NEGATIVE Final    Comment:        The GeneXpert MRSA Assay (FDA approved for NASAL specimens only), is one component of a comprehensive MRSA colonization surveillance program. It is not intended to diagnose MRSA infection nor to guide or monitor treatment for MRSA infections. Performed at [facility], [address], [place], [place] [address]       Radiology Studies: No results found.  Scheduled Meds:  acetaminophen  1,000 mg Oral Q8H   amiodarone  200 mg Oral Daily   apixaban  5 mg Oral BID   atorvastatin  40 mg Oral Daily   Chlorhexidine Gluconate Cloth  6 each Topical Q0600   Chlorhexidine Gluconate Cloth  6 each Topical Q0600   Chlorhexidine Gluconate Cloth  6 each Topical Q0600   cholestyramine light  4 g Oral Q1400   clopidogrel  75 mg Oral Daily   darbepoetin (ARANESP) injection - DIALYSIS  60 mcg Intravenous Q Wed-HD   docusate sodium  100 mg Oral BID   insulin aspart  0-15 Units Subcutaneous TID WC   insulin glargine  15 Units Subcutaneous QHS   levothyroxine  12.5 mcg Oral QAC breakfast   lidocaine   Topical TID   metoprolol tartrate  12.5 mg Oral BID   midodrine  10 mg Oral TID WC   pantoprazole  40 mg Oral Daily   polyethylene glycol  17 g Oral Daily   pregabalin  25 mg Oral Daily   senna-docusate  1 tablet Oral BID   sevelamer carbonate  1,600 mg Oral TID WC   Continuous Infusions:  sodium thiosulfate infusion for calciphylaxis Stopped (12/15/20 1227)     LOS: 13 days   Time spent: 35 minutes   Darliss Cheney, MD [facility]  12/16/2020, 1:51 PM   How to contact the [facility] Attending or Consulting provider [place] or covering provider during after hours [place], for this patient?  Check the care team in [facility] and look for a) attending/consulting [facility] provider listed and b) the [facility] team listed. Page or secure chat [phone]. Log into [email or web address] and use [place]'s  universal password to access. If you do not have the password, please contact the hospital operator. Locate the [facility] provider you are looking for under [facility] and page to a number that you can be directly reached. If you still have difficulty reaching the provider, please page the [facility] (Director on Call) for the Hospitalists listed on amion for assistance.

## 2020-12-16 NOTE — Progress Notes (Signed)
Physical Therapy Treatment Patient Details Name: Kyle Walton MRN: [id number] DOB: 10-Dec-1955 Today's Date: 12/16/2020    History of Present Illness Pt is a 65 y.o. male who presented 5/27 with R leg cellulitis and sepsis. PMH: PAF, OSA, MI, ESRD on HD MWF, DM on insulin, s/p multiple toe amputations, morbid obesity, ESBL/VRE/MRSA and CAD.    PT Comments    Pt was seen for mobility on side of bed to balance then to stand with stedy.  Pt is somewhat unsafe in that he is not concerned  about  being close to edge of bed with askew posture.  Discussed safety with him and put the stedy in place.  Pt requires mult attempts to stand and finally could get his feet under him.  Stood only a few minutes and continues to decline OOB to chair.  Repositioned stedy for return to bed, moving the pt closer to [facility], and then assisted back to bed.   Cleaned up from incontinent episode pt did not know he had, then repositioned on the bed.  Pt is anxious about all movement, but ultimately can tolerate standing and work on [employer] to strengthen his legs.  Follow up with goals of acute PT.  Follow Up Recommendations  SNF     Equipment Recommendations  3in1 (PT);Wheelchair (measurements PT);Wheelchair cushion (measurements PT);Hospital bed;Other (comment)    Recommendations for Other Services       Precautions / Restrictions Precautions Precautions: Fall;Other (comment) Precaution Comments: contact precautions Restrictions Weight Bearing Restrictions: No Other Position/Activity Restrictions: h/o multiple toe amputations bilat    Mobility  Bed Mobility Overal bed mobility: Needs Assistance Bed Mobility: Supine to Sit;Sit to Supine     Supine to sit: Mod assist;+2 for physical assistance;+2 for safety/equipment Sit to supine: Mod assist;+2 for physical assistance;+2 for safety/equipment   General bed mobility comments: cued safety on side of bed and deflated air mattress    Transfers Overall transfer  level: Needs assistance Equipment used: Ambulation equipment used Transfers: Sit to/from Stand Sit to Stand: Mod assist;+2 physical assistance;+2 safety/equipment;From elevated surface         General transfer comment: mod assist to stand with two to power up to stedy, pt is requiring dense cues for management of pain  Ambulation/Gait             General Gait Details: unable   Stairs             Wheelchair Mobility    Modified Rankin (Stroke Patients Only)       Balance Overall balance assessment: Needs assistance Sitting-balance support: Bilateral upper extremity supported;Feet supported Sitting balance-Leahy Scale: Fair     Standing balance support: Bilateral upper extremity supported;During functional activity Standing balance-Leahy Scale: Poor Standing balance comment: leans into device and cannot pull away from lift                            Cognition Arousal/Alertness: Awake/alert Behavior During Therapy: WFL for tasks assessed/performed Overall Cognitive Status: Within Functional Limits for tasks assessed                                 General Comments: motivated to stand but won't stay up in chair      Exercises      General Comments General comments (skin integrity, edema, etc.): pt is tired from all effort, declines OOB to chair even  with lift to get him back to bed      Pertinent Vitals/Pain Pain Assessment: Faces Faces Pain Scale: Hurts whole lot Pain Location: R lateral leg with movement, R thigh with touch Pain Descriptors / Indicators: Grimacing;Guarding;Sharp Pain Intervention(s): Limited activity within patient's tolerance;Monitored during session;Repositioned    Home Living                      Prior Function            PT Goals (current goals can now be found in the care plan section) Acute Rehab PT Goals Patient Stated Goal: get strength back    Frequency    Min 2X/week       PT Plan Current plan remains appropriate    Co-evaluation PT/OT/SLP Co-Evaluation/Treatment: Yes Reason for Co-Treatment: For patient/therapist safety;Complexity of the patient's impairments (multi-system involvement) PT goals addressed during session: Mobility/safety with mobility;Proper use of DME        AM-PAC PT "6 Clicks" Mobility   Outcome Measure  Help needed turning from your back to your side while in a flat bed without using bedrails?: A Lot Help needed moving from lying on your back to sitting on the side of a flat bed without using bedrails?: A Lot Help needed moving to and from a bed to a chair (including a wheelchair)?: A Lot Help needed standing up from a chair using your arms (e.g., wheelchair or bedside chair)?: A Lot Help needed to walk in hospital room?: Total Help needed climbing 3-5 steps with a railing? : Total 6 Click Score: 10    End of Session Equipment Utilized During Treatment: Gait belt Activity Tolerance: Patient tolerated treatment well;Other (comment) (R thigh pain is a precautoin) Patient left: in bed;with call bell/phone within reach;with bed alarm set Nurse Communication: Mobility status;Need for lift equipment PT Visit Diagnosis: Muscle weakness (generalized) (M62.81);Difficulty in walking, not elsewhere classified (R26.2);Pain Pain - Right/Left: Right Pain - part of body: Leg     Time: 1335-1415 PT Time Calculation (min) (ACUTE ONLY): 40 min  Charges:  $Therapeutic Activity: 23-37 mins                Ramond Dial 12/16/2020, 3:20 PM  Mee Hives, PT MS Acute Rehab Dept. Number: [place] [facility] [place]

## 2020-12-16 NOTE — Progress Notes (Signed)
[facility] NEPHROLOGY PROGRESS NOTE  Assessment/ Plan: Pt is a 65 y.o. yo male with HTN, CAD status post CABG, AICD, PAD with numerous toe amputation, ESRD on HD MWF admitted for right leg wound.  OP HD: [facility]  MWF  4h  450/800  117kg  2K/3Ca bath   TDC   Hep 4900 + 500u/hr  #Right leg pain: MRI negative.  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  The calcium based binders and VDRL was discontinued and patient was started on sodium thiosulfate with dialysis MWF.  The skin punch biopsy consistent with infection/cellulitis.  No improvement in skin lesion with antibiotics and it is very tender.  Discussed with Dr. Alton Revere from ID, who also strongly believes that this is calciphylaxis.  Plan to continue sodium thiosulfate to see if it improves the rash.  I have discussed this with the patient.  #Right LE cellulitis status post course of IV antibiotics, blood cultures negative.  # ESRD: MWF, had dialysis yesterday with around 2 L ultrafiltration.  Plan for next HD tomorrow.  # Anemia: Hemoglobin at goal.  Continue ESA weekly.  # Secondary hyperparathyroidism: Calcium level acceptable.  We will do low calcium bath and avoid VDR A or calcium based binders.  Continue Renvela.  Check phosphorus level.  # HTN/volume: Monitor blood pressure.  On midodrine for intradialytic hypotension.  Subjective: Seen and examined.  No improvement in rash and pain.  Denies nausea vomiting chest pain shortness of breath.  Had dialysis yesterday.   Objective Vital signs in last 24 hours: Vitals:   12/15/20 1741 12/15/20 2053 12/16/20 0519 12/16/20 0924  BP: 125/86 120/75 119/67 115/78  Pulse: (!) 105 (!) 127 (!) 116 83  Resp: '20 20 19 18  '$ Temp:  98.6 F (37 C) 98.3 F (36.8 C) 98.6 F (37 C)  TempSrc:  Oral Oral Oral  SpO2: 93% 100% 100% (!) 87%  Weight:      Height:       Weight change:   Intake/Output Summary (Last 24 hours) at 12/16/2020 0930 Last data  filed at 12/16/2020 0600 Gross per 24 hour  Intake 916.03 ml  Output 2000 ml  Net -1083.97 ml        Labs: Basic Metabolic Panel: Recent Labs  Lab 12/11/20 0202 12/13/20 0545 12/15/20 0836 12/16/20 0602  NA 132* 137 135 137  K 5.0 3.9 4.1 3.7  CL 96* 95* 98 95*  CO2 20* '24 24 22  '$ GLUCOSE 167* 160* 241* 198*  BUN 48* 42* 49* 31*  CREATININE 6.91* 5.90* 5.10* 3.95*  CALCIUM 8.9 9.4 9.0 9.0  PHOS 5.3*  --  4.4  --     Liver Function Tests: Recent Labs  Lab 12/11/20 0202 12/13/20 0545 12/15/20 0836  AST  --  31  --   ALT  --  10  --   ALKPHOS  --  115  --   BILITOT  --  1.1  --   PROT  --  6.6  --   ALBUMIN 2.3* 2.3* 2.1*    No results for input(s): LIPASE, AMYLASE in the last 168 hours. No results for input(s): AMMONIA in the last 168 hours. CBC: Recent Labs  Lab 12/11/20 0202 12/13/20 0545 12/15/20 0836 12/16/20 0602  WBC 19.2* 24.5* 21.7* 20.2*  HGB 10.3* 10.6* 9.6* 10.2*  HCT 32.1* 32.5* 30.2* 31.7*  MCV 90.2 89.5 91.2 90.1  PLT 251 248 256 257    Cardiac Enzymes: No  results for input(s): CKTOTAL, CKMB, CKMBINDEX, TROPONINI in the last 168 hours.  CBG: Recent Labs  Lab 12/15/20 0655 12/15/20 1351 12/15/20 1741 12/15/20 2057 12/16/20 0624  GLUCAP 166* 179* 180* 158* 204*     Iron Studies: No results for input(s): IRON, TIBC, TRANSFERRIN, FERRITIN in the last 72 hours. Studies/Results: No results found.  Medications: Infusions:  sodium thiosulfate infusion for calciphylaxis Stopped (12/15/20 1227)    Scheduled Medications:  acetaminophen  1,000 mg Oral Q8H   amiodarone  200 mg Oral Daily   apixaban  5 mg Oral BID   atorvastatin  40 mg Oral Daily   Chlorhexidine Gluconate Cloth  6 each Topical Q0600   Chlorhexidine Gluconate Cloth  6 each Topical Q0600   cholestyramine light  4 g Oral Q1400   clopidogrel  75 mg Oral Daily   darbepoetin (ARANESP) injection - DIALYSIS  60 mcg Intravenous Q Wed-HD   docusate sodium  100 mg Oral BID    insulin aspart  0-15 Units Subcutaneous TID WC   insulin glargine  15 Units Subcutaneous QHS   levothyroxine  12.5 mcg Oral QAC breakfast   lidocaine   Topical TID   metoprolol tartrate  12.5 mg Oral BID   midodrine  10 mg Oral TID WC   pantoprazole  40 mg Oral Daily   polyethylene glycol  17 g Oral Daily   pregabalin  25 mg Oral Daily   senna-docusate  1 tablet Oral BID   sevelamer carbonate  1,600 mg Oral TID WC    have reviewed scheduled and prn medications.  Physical Exam: General:NAD, comfortable Heart:RRR, s1s2 nl Lungs: Clear bilateral, no increased work of breathing Abdomen:soft, Non-tender, non-distended Extremities: Trace edema, right lateral thigh mild redness and tenderness, no improvement. Dialysis Access: TDC.  Wilkie Zenon Prasad Lavra Imler 12/16/2020,9:30 AM  LOS: 13 days

## 2020-12-16 NOTE — Progress Notes (Signed)
   12/16/20 2314  Assess: MEWS Score  Temp 98.3 F (36.8 C)  BP 114/77  Pulse Rate (!) 124  SpO2 95 %  Assess: MEWS Score  MEWS Temp 0  MEWS Systolic 0  MEWS Pulse 2  MEWS RR 0  MEWS LOC 0  MEWS Score 2  MEWS Score Color Yellow  Assess: if the MEWS score is Yellow or Red  Were vital signs taken at a resting state? Yes  Focused Assessment No change from prior assessment  Early Detection of Sepsis Score *See Row Information* Medium  MEWS guidelines implemented *See Row Information* No, previously yellow, continue vital signs every 4 hours (pulse continues to trigger yellow mews, not new)  Take Vital Signs  Increase Vital Sign Frequency  Yellow: Q 2hr X 2 then Q 4hr X 2, if remains yellow, continue Q 4hrs  Escalate  MEWS: Escalate Yellow: discuss with charge nurse/RN and consider discussing with provider and RRT  Notify: Charge Nurse/RN  Name of Charge Nurse/RN Notified Emman  Date Charge Nurse/RN Notified 12/16/20  Time Charge Nurse/RN Notified 2351  Document  Patient Outcome Stabilized after interventions  Progress note created (see row info) Yes

## 2020-12-16 NOTE — Progress Notes (Signed)
Occupational Therapy Treatment Patient Details Name: Kyle Walton MRN: [id number] DOB: 1955-11-04 Today's Date: 12/16/2020    History of present illness Pt is a 65 y.o. male who presented 5/27 with R leg cellulitis and sepsis. PMH: PAF, OSA, MI, ESRD on HD MWF, DM on insulin, s/p multiple toe amputations, morbid obesity, ESBL/VRE/MRSA and CAD.   OT comments  Pt making slow progress with functional goals. Session focused on sitting EOB, grooming seated EOB, sit - stand using Stedy. Pt declined sitting  recliner or further activity after in Stedy x 5-6 minutes. To returned to bed and able to roll for peri hygiene after BM and to position in bed. OT will continue to follow acutely to maximize level of function and safety  Follow Up Recommendations  SNF;Supervision/Assistance - 24 hour    Equipment Recommendations  3 in 1 bedside commode;Tub/shower bench;Wheelchair (measurements OT);Wheelchair cushion (measurements OT);Hospital bed    Recommendations for Other Services      Precautions / Restrictions Precautions Precautions: Fall;Other (comment) Precaution Comments: contact precautions Restrictions Weight Bearing Restrictions: No Other Position/Activity Restrictions: h/o multiple toe amputations bilat       Mobility Bed Mobility Overal bed mobility: Needs Assistance Bed Mobility: Supine to Sit;Sit to Supine     Supine to sit: Mod assist;+2 for physical assistance;+2 for safety/equipment Sit to supine: Mod assist;+2 for physical assistance;+2 for safety/equipment   General bed mobility comments: cued safety on side of bed and deflated air mattress    Transfers Overall transfer level: Needs assistance Equipment used: Ambulation equipment used Transfers: Sit to/from Stand Sit to Stand: Mod assist;+2 physical assistance;+2 safety/equipment;From elevated surface         General transfer comment: mod assist to stand with two to power up to stedy, pt is requiring dense cues for  management of pain    Balance Overall balance assessment: Needs assistance Sitting-balance support: Bilateral upper extremity supported;Feet supported Sitting balance-Leahy Scale: Fair     Standing balance support: Bilateral upper extremity supported;During functional activity Standing balance-Leahy Scale: Poor Standing balance comment: leans into device and cannot pull away from lift                           ADL either performed or assessed with clinical judgement   ADL       Grooming: Wash/dry hands;Wash/dry face;Set up;Supervision/safety;Sitting Grooming Details (indicate cue type and reason): Stedy                       Toileting - Clothing Manipulation Details (indicate cue type and reason): Total A bed level for cleanup after BM in bed       General ADL Comments: Pt continues to be pain limited but much improved sitting balance and standing attempts in [place]     Vision Patient Visual Report: No change from baseline     Perception     Praxis      Cognition Arousal/Alertness: Awake/alert Behavior During Therapy: WFL for tasks assessed/performed Overall Cognitive Status: Within Functional Limits for tasks assessed                                 General Comments: motivated to stand but won't stay up in chair        Exercises     Shoulder Instructions       General Comments pt is tired from all effort,  declines OOB to chair even with lift to get him back to bed    Pertinent Vitals/ Pain       Pain Assessment: Faces Faces Pain Scale: Hurts whole lot Pain Location: R lateral leg with movement, R thigh with touch Pain Descriptors / Indicators: Grimacing;Guarding;Sharp Pain Intervention(s): Limited activity within patient's tolerance;Monitored during session;Repositioned  Home Living                                          Prior Functioning/Environment              Frequency  Min 2X/week         Progress Toward Goals  OT Goals(current goals can now be found in the care plan section)  Progress towards OT goals: Progressing toward goals  Acute Rehab OT Goals Patient Stated Goal: get strength back  Plan Discharge plan remains appropriate    Co-evaluation    PT/OT/SLP Co-Evaluation/Treatment: Yes Reason for Co-Treatment: Complexity of the patient's impairments (multi-system involvement);For patient/therapist safety;To address functional/ADL transfers PT goals addressed during session: Mobility/safety with mobility;Proper use of DME        AM-PAC OT "6 Clicks" Daily Activity     Outcome Measure   Help from another person eating meals?: None Help from another person taking care of personal grooming?: A Little Help from another person toileting, which includes using toliet, bedpan, or urinal?: Total Help from another person bathing (including washing, rinsing, drying)?: A Lot Help from another person to put on and taking off regular upper body clothing?: A Little Help from another person to put on and taking off regular lower body clothing?: Total 6 Click Score: 14    End of Session Equipment Utilized During Treatment: Gait belt;Other (comment) Charlaine Dalton)  OT Visit Diagnosis: Unsteadiness on feet (R26.81);Other abnormalities of gait and mobility (R26.89);Muscle weakness (generalized) (M62.81);Pain Pain - Right/Left: Right Pain - part of body: Leg   Activity Tolerance Patient limited by fatigue   Patient Left in chair;with call bell/phone within reach;in bed;with bed alarm set   Nurse Communication          Time: [id number] OT Time Calculation (min): 31 min  Charges: OT General Charges $OT Visit: 1 Visit OT Treatments $Therapeutic Activity: 8-22 mins     Britt Bottom 12/16/2020, 3:28 PM

## 2020-12-16 NOTE — Progress Notes (Signed)
Pt wanted prevalon boot off right heel and stated that he does not ever want it back on. RN removed and will check again later to see if pt has changed his mind.   Kyle Walton

## 2020-12-17 LAB — CBC
HCT: 32.4 % — ABNORMAL LOW (ref 39.0–52.0)
Hemoglobin: 10.4 g/dL — ABNORMAL LOW (ref 13.0–17.0)
MCH: 29.1 pg (ref 26.0–34.0)
MCHC: 32.1 g/dL (ref 30.0–36.0)
MCV: 90.5 fL (ref 80.0–100.0)
Platelets: 317 10*3/uL (ref 150–400)
RBC: 3.58 MIL/uL — ABNORMAL LOW (ref 4.22–5.81)
RDW: 17.1 % — ABNORMAL HIGH (ref 11.5–15.5)
WBC: 20.7 10*3/uL — ABNORMAL HIGH (ref 4.0–10.5)
nRBC: 0 % (ref 0.0–0.2)

## 2020-12-17 LAB — RENAL FUNCTION PANEL
Albumin: 2.4 g/dL — ABNORMAL LOW (ref 3.5–5.0)
Anion gap: 18 — ABNORMAL HIGH (ref 5–15)
BUN: 41 mg/dL — ABNORMAL HIGH (ref 8–23)
CO2: 22 mmol/L (ref 22–32)
Calcium: 9.2 mg/dL (ref 8.9–10.3)
Chloride: 97 mmol/L — ABNORMAL LOW (ref 98–111)
Creatinine, Ser: 5.07 mg/dL — ABNORMAL HIGH (ref 0.61–1.24)
GFR, Estimated: 12 mL/min — ABNORMAL LOW (ref >60)
Glucose, Bld: 134 mg/dL — ABNORMAL HIGH (ref 70–99)
Phosphorus: 4.5 mg/dL (ref 2.5–4.6)
Potassium: 3.8 mmol/L (ref 3.5–5.1)
Sodium: 137 mmol/L (ref 135–145)

## 2020-12-17 LAB — GLUCOSE, CAPILLARY
Glucose-Capillary: 99 mg/dL (ref 70–99)
Glucose-Capillary: 125 mg/dL — ABNORMAL HIGH (ref 70–99)
Glucose-Capillary: 131 mg/dL — ABNORMAL HIGH (ref 70–99)
Glucose-Capillary: 129 mg/dL — ABNORMAL HIGH (ref 70–99)

## 2020-12-17 LAB — PARATHYROID HORMONE, INTACT (NO CA): PTH: 67 pg/mL — ABNORMAL HIGH (ref 15–65)

## 2020-12-17 MED ORDER — PENTAFLUOROPROP-TETRAFLUOROETH EX AERO: 1.0000 "application " | INHALATION_SPRAY | CUTANEOUS | Status: DC | PRN

## 2020-12-17 MED ORDER — SODIUM CHLORIDE 0.9 % IV SOLN: 100.0000 mL | INTRAVENOUS | Status: DC | PRN

## 2020-12-17 MED ORDER — LIDOCAINE-PRILOCAINE 2.5-2.5 % EX CREA: 1.0000 "application " | TOPICAL_CREAM | CUTANEOUS | Status: DC | PRN

## 2020-12-17 MED ORDER — LIDOCAINE HCL (PF) 1 % IJ SOLN: 5.0000 mL | INTRAMUSCULAR | Status: DC | PRN

## 2020-12-17 MED ORDER — OXYCODONE HCL 5 MG PO TABS
5.0000 mg | ORAL_TABLET | Freq: Two times a day (BID) | ORAL | Status: DC | PRN
  Administered 2020-12-17 – 2020-12-27 (×12): 5 mg via ORAL
  Filled 2020-12-17 (×12): qty 1

## 2020-12-17 MED ORDER — MIDODRINE HCL 5 MG PO TABS
ORAL_TABLET | ORAL | Status: AC
  Administered 2020-12-17: 10 mg via ORAL
  Filled 2020-12-17: qty 2

## 2020-12-17 MED ORDER — HEPARIN SODIUM (PORCINE) 1000 UNIT/ML IJ SOLN
INTRAMUSCULAR | Status: AC
  Administered 2020-12-17: 4200 [IU] via INTRAVENOUS_CENTRAL
  Filled 2020-12-17: qty 5

## 2020-12-17 MED ORDER — ALTEPLASE 2 MG IJ SOLR: 2.0000 mg | Freq: Once | INTRAMUSCULAR | Status: DC | PRN

## 2020-12-17 MED ORDER — HEPARIN SODIUM (PORCINE) 1000 UNIT/ML DIALYSIS: 1000.0000 [IU] | INTRAMUSCULAR | Status: DC | PRN

## 2020-12-17 NOTE — Progress Notes (Addendum)
Pt refused wound care this am to RLE and CHG wipes. RN explained importance and pt still refused.    Kyle Walton

## 2020-12-17 NOTE — TOC Progression Note (Signed)
Transition of Care [facility]) - Progression Note    Patient Details  Name: Joanna Dorey MRN: [id number] Date of Birth: 1955-08-14  Transition of [facility]) CM/SW Contact  Sharlet Salina Mila Homer, LCSW Phone Number: 12/17/2020, 8:48 PM  Clinical Narrative:   Visited with patient (8:31 pm) to talk about his discharge disposition. His wife was not present. Mr. Barretta advised of only one bed offer - [facility] and HD transportation could be arranged if he went to this facility. CSW was given permission to call his wife and call made while in room and message left. CSW will request that weekend CSW f/u with wife regarding SNF placement and added factor of HD treatments.    Expected Discharge Plan: Skilled Nursing Facility Barriers to Discharge: Continued Medical Work up  Expected Discharge Plan and Services Expected Discharge Plan: [place]                                             Social Determinants of Health (SDOH) Interventions    Readmission Risk Interventions No flowsheet data found.

## 2020-12-17 NOTE — Progress Notes (Signed)
Report given to HD at this time.   Kyle Walton

## 2020-12-17 NOTE — Progress Notes (Signed)
PROGRESS NOTE    Kyle Walton  [id number] DOB: 22-Jan-1956 DOA: 12/03/2020 PCP: Patient, No Pcp Per (Inactive)   Brief Narrative:  Patient is a 65 y.o. male PAF, CAD s/p CABG, AICD implantation, PAD-s/p numerous toe amputations, HTN, ESRD on HD MWF-admitted for right leg soft tissue infection and right upper thigh pain.  See below for further details.  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   - Blood cultures are negative so far.   - Completed a course of vancomycin/cefepime.  See below.   Right lateral thigh pain with worsening erythema: imaging unremarkable for underlying abscess, DVT negative -Unclear etiology biopsy as above shows diffuse neutrophilic infiltrate without clear etiology -ID consulted to further evaluate for possible antibiotic needs however given patient's clinical improvement and completion of previous antibiotics and only ongoing leukocytosis as sign of infection. Punch biopsy shows subepidermal edema with diffuse neutrophilic infiltrate involving the dermis extending into the subcutis.  Diagnostic vasculitis is not seen.  PAS staining is negative for fungal hi-fi.  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 -Placed on sodium thiosulfate with dialysis Monday Wednesday Friday per nephrology -Narcotics previously discontinued after multiple issues with an episode of lethargy requiring Narcan last week however patient does  have a real reason for pain and he is requesting to give him something so I will start him on low-dose oxycodone 5 mg every 12 hours as needed. -Gabapentin discontinued due to previous episodes of encephalopathy -Topical lidocaine a very little improvement, Lyrica ongoing   Lower extremity weakness/acute on chronic ambulatory dysfunction:  -Right greater than left - appears to be secondary to pain and not a mechanical/impingement issue -No further recommendations from neurology -PT/OT ongoing, ambulation issues appears to be secondary to pain rather than functional issue.  PT OT recommends SNF.  When discussed in progression today, TOC not aware of those recommendations.  Informed TOC that patient is medically ready for discharge once bed is available at SNF.   Acute toxic encephalopathy, resolved: - 5/29 -patient was given 1 dose of Narcan.  Continue to hold off on narcotics   ESRD on HD MWF: Nephrology following.   HTN: BP on the softer/lower side-continue midodrine- low-dose metoprolol.     Chronic diastolic heart failure: Volume status relatively stable-diuresis with HD.   CAD-s/p CABG 2017: No anginal symptoms-on Plavix/statin/beta-blocker   History of [id number] amiodarone, metoprolol and Eliquis.   History of AICD implantation, stable   History of PAD-s/p multiple toe amputations bilaterally, stable   DM-2, uncontrolled with hyperglycemia: Blood sugar fairly controlled but slightly labile, continue Lantus 15 units and SSI.   Hypothyroidism: Continue Synthroid   OSA: Continue CPAP nightly   Legally blind   Morbid Obesity: Estimated body mass index is 49.22 kg/m as calculated from the following:   Height as of this encounter: '5\' 10"'$  (1.778 m).   Weight as of this encounter: 155.6 kg.      DVT prophylaxis:  Code Status: Full Code  Family Communication: None present at bedside.  Plan of care discussed with patient in length and he verbalized understanding and  agreed with it.  Status is: Inpatient  Remains inpatient appropriate because:Unsafe d/c plan  Dispo: The patient is from: Home              Anticipated d/c is to: SNF, TOC was made aware early morning of 12/16/2020 about patient being medically stable for discharge.  Waiting for arrangements for SNF.              Patient currently is medically stable to d/c.   Difficult to place patient No        Estimated body mass index is 47.35 kg/m as calculated from the following:   Height as of this encounter: '5\' 10"'$  (1.778 m).   Weight as of this encounter: 149.7 kg.  Pressure Injury 12/11/20 Sacrum Right;Left;Mid Stage 2 -  Partial thickness loss of dermis presenting as a shallow open injury with a red, pink wound bed without slough. (Active)  12/11/20 0551  Location: Sacrum  Location Orientation: Right;Left;Mid  Staging: Stage 2 -  Partial thickness loss of dermis presenting as a shallow open injury with a red, pink wound bed without slough.  Wound Description (Comments):   Present on Admission:      Nutritional status:               Consultants:  Nephrology ID  Procedures:  Punch biopsy  Antimicrobials:  Anti-infectives (From admission, onward)    Start     Dose/Rate Route Frequency Ordered Stop   12/08/20 1007  vancomycin (VANCOCIN) 1-5 GM/200ML-% IVPB       Note to Pharmacy: Judieth Keens  : cabinet override      12/08/20 1007 12/08/20 1102   12/06/20 1203  vancomycin (VANCOCIN) 1-5 GM/200ML-% IVPB       Note to Pharmacy: Wallace Cullens   : cabinet override      12/06/20 1203 12/06/20 1206   12/04/20 1030  vancomycin (VANCOREADY) IVPB 1000 mg/200 mL        1,000 mg 200 mL/hr over 60 Minutes Intravenous  Once 12/04/20 0930 12/04/20 1230   12/03/20 2300  vancomycin (VANCOREADY) IVPB 1000 mg/200 mL        1,000 mg 200 mL/hr over 60 Minutes Intravenous Every M-W-F (Hemodialysis) 12/03/20 2019 12/09/20 2359   12/03/20 1830  ceFEPIme (MAXIPIME) 1 g in  sodium chloride 0.9 % 100 mL IVPB  Status:  Discontinued        1 g 200 mL/hr over 30 Minutes Intravenous Every 24 hours 12/03/20 1802 12/05/20 1630   12/03/20 1800  vancomycin (VANCOREADY) IVPB 1000 mg/200 mL  Status:  Discontinued        1,000 mg 200 mL/hr over 60 Minutes Intravenous  Once 12/03/20 1756 12/03/20 1801   12/03/20 1300  vancomycin (VANCOREADY) IVPB 1750 mg/350 mL        1,750 mg 175 mL/hr over 120 Minutes Intravenous  Once 12/03/20 1252 12/03/20 1706   12/03/20 1300  cefTRIAXone (ROCEPHIN) 2 g in sodium chloride 0.9 % 100 mL IVPB        2 g 200 mL/hr over 30 Minutes Intravenous  Once 12/03/20 1252 12/03/20 1345          Subjective: Patient seen and examined in dialysis unit.  He complained of pain in the right lower extremity but improving.  No new complaint.  Requested some pain medications.  Objective:  Vitals:   12/17/20 1100 12/17/20 1130 12/17/20 1207 12/17/20 1236  BP: 114/86 96/69 (!) 124/98 103/67  Pulse: 70 (!) 102 (!) 106 (!) 106  Resp:   20 18  Temp:   (!) 97.4 F (36.3 C) (!) 97.5 F (36.4 C)  TempSrc:   Oral Oral  SpO2:   100% 96%  Weight:   (!) 149.7 kg   Height:        Intake/Output Summary (Last 24 hours) at 12/17/2020 1436 Last data filed at 12/17/2020 1207 Gross per 24 hour  Intake 480 ml  Output 2500 ml  Net -2020 ml    Filed Weights   12/15/20 1240 12/17/20 0800 12/17/20 1207  Weight: 131 kg (!) 151.5 kg (!) 149.7 kg    Examination:  General exam: Appears calm and comfortable  Respiratory system: Clear to auscultation. Respiratory effort normal. Cardiovascular system: S1 & S2 heard, RRR. No JVD, murmurs, rubs, gallops or clicks. No pedal edema. Gastrointestinal system: Abdomen is nondistended, soft and nontender. No organomegaly or masses felt. Normal bowel sounds heard. Central nervous system: Alert and oriented. No focal neurological deficits. Extremities: Slightly decreased in the right lower extremity due to pain. Skin: No  rashes, lesions or ulcers.  Psychiatry: Judgement and insight appear normal. Mood & affect appropriate.   Data Reviewed: I have personally reviewed following labs and imaging studies  CBC: Recent Labs  Lab 12/11/20 0202 12/13/20 0545 12/15/20 0836 12/16/20 0602 12/17/20 0731  WBC 19.2* 24.5* 21.7* 20.2* 20.7*  HGB 10.3* 10.6* 9.6* 10.2* 10.4*  HCT 32.1* 32.5* 30.2* 31.7* 32.4*  MCV 90.2 89.5 91.2 90.1 90.5  PLT 251 248 256 257 [id number]    Basic Metabolic Panel: Recent Labs  Lab 12/11/20 0202 12/13/20 0545 12/15/20 0836 12/16/20 0602 12/16/20 0937 12/17/20 0731  NA 132* 137 135 137  --  137  K 5.0 3.9 4.1 3.7  --  3.8  CL 96* 95* 98 95*  --  97*  CO2 20* '24 24 22  '$ --  22  GLUCOSE 167* 160* 241* 198*  --  134*  BUN 48* 42* 49* 31*  --  41*  CREATININE 6.91* 5.90* 5.10* 3.95*  --  5.07*  CALCIUM 8.9 9.4 9.0 9.0  --  9.2  PHOS 5.3*  --  4.4  --  3.2 4.5    GFR: Estimated Creatinine Clearance: 21.3 mL/min (A) (by C-G formula based on SCr of 5.07 mg/dL (H)). Liver Function Tests: Recent Labs  Lab 12/11/20 0202 12/13/20 0545 12/15/20 0836 12/17/20 0731  AST  --  31  --   --   ALT  --  10  --   --   ALKPHOS  --  115  --   --   BILITOT  --  1.1  --   --   PROT  --  6.6  --   --   ALBUMIN 2.3* 2.3* 2.1* 2.4*    No results for input(s): LIPASE, AMYLASE in the last 168 hours. No results for input(s): AMMONIA in the last 168 hours. Coagulation Profile: No results for input(s): INR, PROTIME in the last 168 hours. Cardiac Enzymes: No results for input(s): CKTOTAL, CKMB, CKMBINDEX, TROPONINI in the last 168 hours. BNP (last 3 results) No results for input(s): PROBNP in the last 8760 hours. HbA1C: No results for input(s): HGBA1C in the last 72 hours. CBG: Recent Labs  Lab 12/16/20 1147 12/16/20 1647 12/16/20 2319 12/17/20 0654 12/17/20 1233  GLUCAP 239* 183* 169* 99 125*  Lipid Profile: No results for input(s): CHOL, HDL, LDLCALC, TRIG, CHOLHDL, LDLDIRECT in  the last 72 hours. Thyroid Function Tests: No results for input(s): TSH, T4TOTAL, FREET4, T3FREE, THYROIDAB in the last 72 hours. Anemia Panel: No results for input(s): VITAMINB12, FOLATE, FERRITIN, TIBC, IRON, RETICCTPCT in the last 72 hours. Sepsis Labs: No results for input(s): PROCALCITON, LATICACIDVEN in the last 168 hours.  Recent Results (from the past 240 hour(s))  MRSA PCR Screening     Status: None   Collection Time: 12/15/20  8:23 AM   Specimen: Nasopharyngeal  Result Value Ref Range Status   MRSA by PCR NEGATIVE NEGATIVE Final    Comment:        The GeneXpert MRSA Assay (FDA approved for NASAL specimens only), is one component of a comprehensive MRSA colonization surveillance program. It is not intended to diagnose MRSA infection nor to guide or monitor treatment for MRSA infections. Performed at [facility], [place] [address], [place], [place] [address]        Radiology Studies: No results found.  Scheduled Meds:  acetaminophen  1,000 mg Oral Q8H   amiodarone  200 mg Oral Daily   apixaban  5 mg Oral BID   atorvastatin  40 mg Oral Daily   Chlorhexidine Gluconate Cloth  6 each Topical Q0600   Chlorhexidine Gluconate Cloth  6 each Topical Q0600   Chlorhexidine Gluconate Cloth  6 each Topical Q0600   cholestyramine light  4 g Oral Q1400   clopidogrel  75 mg Oral Daily   darbepoetin (ARANESP) injection - DIALYSIS  60 mcg Intravenous Q Wed-HD   docusate sodium  100 mg Oral BID   insulin aspart  0-15 Units Subcutaneous TID WC   insulin glargine  15 Units Subcutaneous QHS   levothyroxine  12.5 mcg Oral QAC breakfast   lidocaine   Topical TID   metoprolol tartrate  12.5 mg Oral BID   midodrine  10 mg Oral TID WC   pantoprazole  40 mg Oral Daily   polyethylene glycol  17 g Oral Daily   pregabalin  25 mg Oral Daily   senna-docusate  1 tablet Oral BID   sevelamer carbonate  1,600 mg Oral TID WC   Continuous Infusions:  sodium thiosulfate infusion for  calciphylaxis 25 g (12/17/20 1054)     LOS: 14 days   Time spent: 28 minutes   Darliss Cheney, MD [facility]  12/17/2020, 2:36 PM   How to contact the [facility] Attending or Consulting provider [place] or covering provider during after hours [place], for this patient?  Check the care team in [facility] and look for a) attending/consulting [facility] provider listed and b) the [facility] team listed. Page or secure chat [phone]. Log into [email or web address] and use [place] universal password to access. If you do not have the password, please contact the hospital operator. Locate the [facility] provider you are looking for under [facility] and page to a number that you can be directly reached. If you still have difficulty reaching the provider, please page the [facility] (Director on Call) for the Hospitalists listed on amion for assistance.

## 2020-12-17 NOTE — Progress Notes (Signed)
RT came to place patient on CPAP HS. Patient is already on CPAP and tolerating well.

## 2020-12-17 NOTE — Progress Notes (Signed)
[facility] NEPHROLOGY PROGRESS NOTE  Assessment/ Plan: Pt is a 65 y.o. yo male with HTN, CAD status post CABG, AICD, PAD with numerous toe amputation, ESRD on HD MWF admitted for right leg wound.  OP HD: [facility]  MWF  4h  450/800  117kg  2K/3Ca bath   TDC   Hep 4900 + 500u/hr  #Right leg pain: MRI negative.  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  The calcium based binders and VDRL was discontinued and patient was started on sodium thiosulfate with dialysis MWF.  The skin punch biopsy consistent with infection/cellulitis.  No improvement in skin lesion with antibiotics and it is very tender.  Discussed with Dr. 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  Plan to continue sodium thiosulfate to see if it improves the rash.  I have discussed this with the patient.  #Right LE cellulitis status post course of IV antibiotics, blood cultures negative.  # ESRD: MWF, dialysis today.  On midodrine for intradialytic hypotension.  Hopefully he will tolerate ultrafiltration.  Discussed with the dialysis nurse.    # Anemia: Hemoglobin at goal.  Continue ESA weekly.  # Secondary hyperparathyroidism: Calcium and phosphorus level acceptable.  We will do low calcium bath and avoid VDR A or calcium based binders.  Continue Renvela.  # HTN/volume: Monitor blood pressure.  On midodrine for intradialytic hypotension.  #Atrial fibrillation: He was tachycardic during dialysis today.  This may limit ultrafiltration.  He is currently on amiodarone, metoprolol and Eliquis.  Monitor heart rate.  Subjective: Seen and examined at dialysis unit.  He has no improvement of right thigh rash.  Continue to have pain.  He was tachycardic and systolic blood pressure low during dialysis.  Patient reports that his blood pressure tends to be low as outpatient.   Objective Vital signs in last 24 hours: Vitals:   12/17/20 0806 12/17/20 0808 12/17/20  0830 12/17/20 0900  BP: 96/70 (!) 97/55 97/72 (!) 101/58  Pulse: (!) 110 (!) 114 (!) 109 (!) 106  Resp:      Temp:      TempSrc:      SpO2: 95%     Weight:      Height:       Weight change:   Intake/Output Summary (Last 24 hours) at 12/17/2020 0910 Last data filed at 12/17/2020 0200 Gross per 24 hour  Intake 600 ml  Output 0 ml  Net 600 ml        Labs: Basic Metabolic Panel: Recent Labs  Lab 12/15/20 0836 12/16/20 0602 12/16/20 0937 12/17/20 0731  NA 135 137  --  137  K 4.1 3.7  --  3.8  CL 98 95*  --  97*  CO2 24 22  --  22  GLUCOSE 241* 198*  --  134*  BUN 49* 31*  --  41*  CREATININE 5.10* 3.95*  --  5.07*  CALCIUM 9.0 9.0  --  9.2  PHOS 4.4  --  3.2 4.5    Liver Function Tests: Recent Labs  Lab 12/13/20 0545 12/15/20 0836 12/17/20 0731  AST 31  --   --   ALT 10  --   --   ALKPHOS 115  --   --   BILITOT 1.1  --   --   PROT 6.6  --   --   ALBUMIN 2.3* 2.1* 2.4*    No results for input(s): LIPASE, AMYLASE in  the last 168 hours. No results for input(s): AMMONIA in the last 168 hours. CBC: Recent Labs  Lab 12/11/20 0202 12/13/20 0545 12/15/20 0836 12/16/20 0602 12/17/20 0731  WBC 19.2* 24.5* 21.7* 20.2* 20.7*  HGB 10.3* 10.6* 9.6* 10.2* 10.4*  HCT 32.1* 32.5* 30.2* 31.7* 32.4*  MCV 90.2 89.5 91.2 90.1 90.5  PLT 251 248 256 257 317    Cardiac Enzymes: No results for input(s): CKTOTAL, CKMB, CKMBINDEX, TROPONINI in the last 168 hours.  CBG: Recent Labs  Lab 12/16/20 0624 12/16/20 1147 12/16/20 1647 12/16/20 2319 12/17/20 0654  GLUCAP 204* 239* 183* 169* 99     Iron Studies: No results for input(s): IRON, TIBC, TRANSFERRIN, FERRITIN in the last 72 hours. Studies/Results: No results found.  Medications: Infusions:  sodium chloride     sodium chloride     sodium thiosulfate infusion for calciphylaxis Stopped (12/15/20 1227)    Scheduled Medications:  acetaminophen  1,000 mg Oral Q8H   amiodarone  200 mg Oral Daily    apixaban  5 mg Oral BID   atorvastatin  40 mg Oral Daily   Chlorhexidine Gluconate Cloth  6 each Topical Q0600   Chlorhexidine Gluconate Cloth  6 each Topical Q0600   Chlorhexidine Gluconate Cloth  6 each Topical Q0600   cholestyramine light  4 g Oral Q1400   clopidogrel  75 mg Oral Daily   darbepoetin (ARANESP) injection - DIALYSIS  60 mcg Intravenous Q Wed-HD   docusate sodium  100 mg Oral BID   insulin aspart  0-15 Units Subcutaneous TID WC   insulin glargine  15 Units Subcutaneous QHS   levothyroxine  12.5 mcg Oral QAC breakfast   lidocaine   Topical TID   metoprolol tartrate  12.5 mg Oral BID   midodrine  10 mg Oral TID WC   pantoprazole  40 mg Oral Daily   polyethylene glycol  17 g Oral Daily   pregabalin  25 mg Oral Daily   senna-docusate  1 tablet Oral BID   sevelamer carbonate  1,600 mg Oral TID WC    have reviewed scheduled and prn medications.  Physical Exam: General:NAD, comfortable, able to lie flat Heart:RRR, s1s2 nl Lungs: Clear bilateral, no increased work of breathing Abdomen:soft, Non-tender, non-distended Extremities: Trace edema, right lateral thigh mild redness and tenderness, no improvement. Dialysis Access: TDC.  Meghan Warshawsky Prasad Galadriel Shroff 12/17/2020,9:10 AM  LOS: 14 days

## 2020-12-18 LAB — GLUCOSE, CAPILLARY
Glucose-Capillary: 201 mg/dL — ABNORMAL HIGH (ref 70–99)
Glucose-Capillary: 139 mg/dL — ABNORMAL HIGH (ref 70–99)
Glucose-Capillary: 165 mg/dL — ABNORMAL HIGH (ref 70–99)
Glucose-Capillary: 205 mg/dL — ABNORMAL HIGH (ref 70–99)

## 2020-12-18 MED ORDER — CHOLESTYRAMINE LIGHT 4 G PO PACK
4.0000 g | PACK | Freq: Two times a day (BID) | ORAL | Status: DC
  Administered 2020-12-18 – 2020-12-31 (×25): 4 g via ORAL
  Filled 2020-12-18 (×30): qty 1

## 2020-12-18 MED ORDER — CHOLESTYRAMINE LIGHT 4 G PO PACK
4.0000 g | PACK | Freq: Two times a day (BID) | ORAL | Status: DC
  Filled 2020-12-18: qty 1

## 2020-12-18 MED ORDER — CHOLESTYRAMINE LIGHT 4 G PO PACK
4.0000 g | PACK | Freq: Once | ORAL | Status: AC
  Administered 2020-12-18: 4 g via ORAL
  Filled 2020-12-18: qty 1

## 2020-12-18 NOTE — Progress Notes (Signed)
Telemetry informed RN that patient is having ST elevation at 2.0 and the QTC over 500, MD has been notified and gave verbal order to complete EKG. RN will complete new order, notify MD of completion and continue to monitor this patient.

## 2020-12-18 NOTE — Progress Notes (Signed)
RT placed bipap hs on patient. 2L O2 bleed in needed. Patient tolerating well at this time.

## 2020-12-18 NOTE — TOC Progression Note (Signed)
Transition of Care [facility]) - Progression Note    Patient Details  Name: Kyle Walton MRN: [id number] Date of Birth: 1955-11-28  Transition of Care [facility]) CM/SW [place], [place] Phone Number: 12/18/2020, 4:26 PM  Clinical Narrative:    CSW spoke with pt and wife, they are agreeable to SNF placement at rehab at [facility]. CSW will follow up with [facility] and start insurance authorization. Wife is well prepared for care for him after rehab stay. SW will continue to follow.   Expected Discharge Plan: Skilled Nursing Facility Barriers to Discharge: Continued Medical Work up  Expected Discharge Plan and Services Expected Discharge Plan: [place]                                               Social Determinants of Health (SDOH) Interventions    Readmission Risk Interventions No flowsheet data found.

## 2020-12-18 NOTE — Progress Notes (Signed)
   12/18/20 0435  Provider Notification  Provider Name/Title Dr. Marlowe Sax  Date Provider Notified 12/18/20  Time Provider Notified [phone]  Notification Type Page  Notification Reason Other (Comment) (order clarification)   Patient is complaining of severe generalized itchiness.  He states that Benadryl, Atarax, or any of the lotions do not help with his itching.  He is asking for a second dose of cholestyramine.  Dr. Marlowe Sax made aware.  Order received for the cholestyramine and was given.  Will continue to monitor patient.  Earleen Reaper RN

## 2020-12-18 NOTE — Progress Notes (Addendum)
Patient refused wound care and also refused to wear prevalone boots, RN educated patient and will continue to monitor this patient.

## 2020-12-18 NOTE — Progress Notes (Signed)
PROGRESS NOTE    Kyle Walton  [id number] DOB: 11/25/1955 DOA: 12/03/2020 PCP: Patient, No Pcp Per (Inactive)   Brief Narrative:  Patient is a 65 y.o. male PAF, CAD s/p CABG, AICD implantation, PAD-s/p numerous toe amputations, HTN, ESRD on HD MWF-admitted for right leg soft tissue infection and right upper thigh pain.  See below for further details.  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   - Blood cultures are negative so far.   - Completed a course of vancomycin/cefepime.  See below.   Right lateral thigh pain with worsening erythema: imaging unremarkable for underlying abscess, DVT negative -Unclear etiology, biopsy as above shows diffuse neutrophilic infiltrate without clear etiology -ID consulted to further evaluate for possible antibiotic needs however given patient's clinical improvement and completion of previous antibiotics and only ongoing leukocytosis as sign of infection. Punch biopsy shows subepidermal edema with diffuse neutrophilic infiltrate involving the dermis extending into the subcutis.  Diagnostic vasculitis is not seen.  PAS staining is negative for fungal hi-fi.  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 -Placed on sodium thiosulfate with dialysis Monday Wednesday Friday per nephrology -Narcotics previously discontinued after multiple issues with an episode of lethargy requiring Narcan last week however patient does  have a real reason for pain and thus I will continue low-dose oxycodone 5 mg every 12 hours as needed. -Gabapentin discontinued due to previous episodes of encephalopathy -Topical lidocaine a very little improvement, Lyrica ongoing   Lower extremity weakness/acute on chronic ambulatory dysfunction:  -Right greater than left - appears to be secondary to pain and not a mechanical/impingement issue -No further recommendations from neurology -PT/OT ongoing, ambulation issues appears to be secondary to pain rather than functional issue.  PT OT recommends SNF.  When discussed in progression today, TOC not aware of those recommendations.  Informed TOC that patient is medically ready for discharge once bed is available at SNF.   Acute toxic encephalopathy, resolved: - 5/29 -patient was given 1 dose of Narcan.  Continue to hold off on narcotics   ESRD on HD MWF: Nephrology following.   HTN: BP on the softer/lower side-continue midodrine- low-dose metoprolol.     Chronic diastolic heart failure: Volume status relatively stable-diuresis with HD.   CAD-s/p CABG 2017: No anginal symptoms-on Plavix/statin/beta-blocker   History of [id number] amiodarone, metoprolol and Eliquis.   History of AICD implantation, stable   History of PAD-s/p multiple toe amputations bilaterally, stable   DM-2, uncontrolled with hyperglycemia: Blood sugar fairly controlled but slightly labile, continue Lantus 15 units and SSI.   Hypothyroidism: Continue Synthroid   OSA: Continue CPAP nightly   Legally blind   Morbid Obesity: Estimated body mass index is 49.22 kg/m as calculated from the following:   Height as of this encounter: '5\' 10"'$  (1.778 m).   Weight as of this encounter: 155.6 kg.    PS: I was informed earlier by primary nurse that telemetry had called that patient  was having ST elevation MI.  Stat EKG was done which shows atrial tachycardia with premature atrial complexes, no ST T wave elevation.  Long  QTC.  Patient was seen right away.  He was sleeping peacefully with the BiPAP on, he did not have any chest pain or shortness of breath.  We will avoid QTC prolonging medications.  Monitor on telemetry.   DVT prophylaxis:    Code Status: Full Code  Family Communication: None present at bedside.  Plan of care discussed with patient in length and he verbalized understanding and agreed with it.  Status is: Inpatient  Remains inpatient appropriate because:Unsafe d/c plan  Dispo: The patient is from: Home              Anticipated d/c is to: SNF, TOC was made aware early morning of 12/16/2020 about patient being medically stable for discharge.  Waiting for arrangements for SNF.              Patient currently is medically stable to d/c.   Difficult to place patient No        Estimated body mass index is 47.35 kg/m as calculated from the following:   Height as of this encounter: '5\' 10"'$  (1.778 m).   Weight as of this encounter: 149.7 kg.  Pressure Injury 12/11/20 Sacrum Right;Left;Mid Stage 2 -  Partial thickness loss of dermis presenting as a shallow open injury with a red, pink wound bed without slough. (Active)  12/11/20 0551  Location: Sacrum  Location Orientation: Right;Left;Mid  Staging: Stage 2 -  Partial thickness loss of dermis presenting as a shallow open injury with a red, pink wound bed without slough.  Wound Description (Comments):   Present on Admission:      Nutritional status:               Consultants:  Nephrology ID  Procedures:  Punch biopsy  Antimicrobials:  Anti-infectives (From admission, onward)    Start     Dose/Rate Route Frequency Ordered Stop   12/08/20 1007  vancomycin (VANCOCIN) 1-5 GM/200ML-% IVPB       Note to Pharmacy: Judieth Keens  : cabinet override      12/08/20 1007 12/08/20 1102   12/06/20 1203  vancomycin (VANCOCIN) 1-5 GM/200ML-% IVPB       Note to Pharmacy: Wallace Cullens   : cabinet override      12/06/20 1203  12/06/20 1206   12/04/20 1030  vancomycin (VANCOREADY) IVPB 1000 mg/200 mL        1,000 mg 200 mL/hr over 60 Minutes Intravenous  Once 12/04/20 0930 12/04/20 1230   12/03/20 2300  vancomycin (VANCOREADY) IVPB 1000 mg/200 mL        1,000 mg 200 mL/hr over 60 Minutes Intravenous Every M-W-F (Hemodialysis) 12/03/20 2019 12/09/20 2359   12/03/20 1830  ceFEPIme (MAXIPIME) 1 g in sodium chloride 0.9 % 100 mL IVPB  Status:  Discontinued        1 g 200 mL/hr over 30 Minutes Intravenous Every 24 hours 12/03/20 1802 12/05/20 1630   12/03/20 1800  vancomycin (VANCOREADY) IVPB 1000 mg/200 mL  Status:  Discontinued        1,000 mg 200 mL/hr over 60 Minutes Intravenous  Once 12/03/20 1756 12/03/20 1801   12/03/20 1300  vancomycin (VANCOREADY) IVPB 1750 mg/350 mL        1,750 mg 175 mL/hr over 120 Minutes Intravenous  Once 12/03/20 1252 12/03/20 1706   12/03/20 1300  cefTRIAXone (ROCEPHIN) 2  g in sodium chloride 0.9 % 100 mL IVPB        2 g 200 mL/hr over 30 Minutes Intravenous  Once 12/03/20 1252 12/03/20 1345          Subjective: Patient seen and examined.  Doing well today.  Did not complain of pain today.  Objective: Vitals:   12/17/20 2136 12/17/20 2139 12/18/20 0534 12/18/20 0930  BP: (!) 115/57 (!) 115/57 94/62 122/74  Pulse: (!) 124 (!) 124 89 (!) 101  Resp:   18 18  Temp: 97.9 F (36.6 C)   98.1 F (36.7 C)  TempSrc: Oral   Oral  SpO2: 99%  98% 93%  Weight:      Height:        Intake/Output Summary (Last 24 hours) at 12/18/2020 1401 Last data filed at 12/18/2020 1300 Gross per 24 hour  Intake 1200 ml  Output 0 ml  Net 1200 ml    Filed Weights   12/15/20 1240 12/17/20 0800 12/17/20 1207  Weight: 131 kg (!) 151.5 kg (!) 149.7 kg    Examination: General exam: Appears calm and comfortable  Respiratory system: Clear to auscultation. Respiratory effort normal. Cardiovascular system: S1 & S2 heard, RRR. No JVD, murmurs, rubs, gallops or clicks. No pedal  edema. Gastrointestinal system: Abdomen is nondistended, soft and nontender. No organomegaly or masses felt. Normal bowel sounds heard. Central nervous system: Alert and oriented. No focal neurological deficits. Extremities: Slightly decreased distally in the right lower extremity due to pain. Skin: Some hyperpigmentation on the lateral side of the thigh with epidermal edema, tenderness on palpation.  No warmth. Psychiatry: Judgement and insight appear normal. Mood & affect appropriate.     Data Reviewed: I have personally reviewed following labs and imaging studies  CBC: Recent Labs  Lab 12/13/20 0545 12/15/20 0836 12/16/20 0602 12/17/20 0731  WBC 24.5* 21.7* 20.2* 20.7*  HGB 10.6* 9.6* 10.2* 10.4*  HCT 32.5* 30.2* 31.7* 32.4*  MCV 89.5 91.2 90.1 90.5  PLT 248 256 257 [id number]    Basic Metabolic Panel: Recent Labs  Lab 12/13/20 0545 12/15/20 0836 12/16/20 0602 12/16/20 0937 12/17/20 0731  NA 137 135 137  --  137  K 3.9 4.1 3.7  --  3.8  CL 95* 98 95*  --  97*  CO2 '24 24 22  '$ --  22  GLUCOSE 160* 241* 198*  --  134*  BUN 42* 49* 31*  --  41*  CREATININE 5.90* 5.10* 3.95*  --  5.07*  CALCIUM 9.4 9.0 9.0  --  9.2  PHOS  --  4.4  --  3.2 4.5    GFR: Estimated Creatinine Clearance: 21.3 mL/min (A) (by C-G formula based on SCr of 5.07 mg/dL (H)). Liver Function Tests: Recent Labs  Lab 12/13/20 0545 12/15/20 0836 12/17/20 0731  AST 31  --   --   ALT 10  --   --   ALKPHOS 115  --   --   BILITOT 1.1  --   --   PROT 6.6  --   --   ALBUMIN 2.3* 2.1* 2.4*    No results for input(s): LIPASE, AMYLASE in the last 168 hours. No results for input(s): AMMONIA in the last 168 hours. Coagulation Profile: No results for input(s): INR, PROTIME in the last 168 hours. Cardiac Enzymes: No results for input(s): CKTOTAL, CKMB, CKMBINDEX, TROPONINI in the last 168 hours. BNP (last 3 results) No results for input(s): PROBNP in the last 8760 hours. HbA1C:  No results for input(s):  HGBA1C in the last 72 hours. CBG: Recent Labs  Lab 12/17/20 1233 12/17/20 1646 12/17/20 2117 12/18/20 0643 12/18/20 1130  GLUCAP 125* 131* 129* 201* 139*    Lipid Profile: No results for input(s): CHOL, HDL, LDLCALC, TRIG, CHOLHDL, LDLDIRECT in the last 72 hours. Thyroid Function Tests: No results for input(s): TSH, T4TOTAL, FREET4, T3FREE, THYROIDAB in the last 72 hours. Anemia Panel: No results for input(s): VITAMINB12, FOLATE, FERRITIN, TIBC, IRON, RETICCTPCT in the last 72 hours. Sepsis Labs: No results for input(s): PROCALCITON, LATICACIDVEN in the last 168 hours.  Recent Results (from the past 240 hour(s))  MRSA PCR Screening     Status: None   Collection Time: 12/15/20  8:23 AM   Specimen: Nasopharyngeal  Result Value Ref Range Status   MRSA by PCR NEGATIVE NEGATIVE Final    Comment:        The GeneXpert MRSA Assay (FDA approved for NASAL specimens only), is one component of a comprehensive MRSA colonization surveillance program. It is not intended to diagnose MRSA infection nor to guide or monitor treatment for MRSA infections. Performed at [facility], [place] [address] [place], [place] [address]        Radiology Studies: No results found.  Scheduled Meds:  acetaminophen  1,000 mg Oral Q8H   amiodarone  200 mg Oral Daily   apixaban  5 mg Oral BID   atorvastatin  40 mg Oral Daily   Chlorhexidine Gluconate Cloth  6 each Topical Q0600   Chlorhexidine Gluconate Cloth  6 each Topical Q0600   Chlorhexidine Gluconate Cloth  6 each Topical Q0600   cholestyramine light  4 g Oral Q12H   clopidogrel  75 mg Oral Daily   darbepoetin (ARANESP) injection - DIALYSIS  60 mcg Intravenous Q Wed-HD   docusate sodium  100 mg Oral BID   insulin aspart  0-15 Units Subcutaneous TID WC   insulin glargine  15 Units Subcutaneous QHS   levothyroxine  12.5 mcg Oral QAC breakfast   lidocaine   Topical TID   metoprolol tartrate  12.5 mg Oral BID   midodrine  10 mg Oral  TID WC   pantoprazole  40 mg Oral Daily   polyethylene glycol  17 g Oral Daily   pregabalin  25 mg Oral Daily   senna-docusate  1 tablet Oral BID   sevelamer carbonate  1,600 mg Oral TID WC   Continuous Infusions:  sodium thiosulfate infusion for calciphylaxis Stopped (12/17/20 1154)     LOS: 15 days   Time spent: 27 minutes   Darliss Cheney, MD [facility]  12/18/2020, 2:01 PM   How to contact the [facility] Attending or Consulting provider [place] or covering provider during after hours [place], for this patient?  Check the care team in [facility] and look for a) attending/consulting TRH provider listed and b) the [facility] team listed. Page or secure chat [phone]. Log into [email or web address] and use [place]'s universal password to access. If you do not have the password, please contact the hospital operator. Locate the [facility] provider you are looking for under [facility] and page to a number that you can be directly reached. If you still have difficulty reaching the provider, please page the [facility] (Director on Call) for the Hospitalists listed on amion for assistance.

## 2020-12-18 NOTE — Progress Notes (Signed)
[facility] Progress Note  Subjective: seen in room, R thigh pain continues to improve slowly.  Taking only tylenol now.   Vitals:   12/17/20 1727 12/17/20 2136 12/17/20 2139 12/18/20 0534  BP: 110/62 (!) 115/57 (!) 115/57 94/62  Pulse: (!) 126 (!) 124 (!) 124 89  Resp: 18   18  Temp: 98.5 F (36.9 C) 97.9 F (36.6 C)    TempSrc:  Oral    SpO2: 98% 99%  98%  Weight:      Height:        Exam: General:NAD, comfortable, able to lie flat Heart:RRR, s1s2 nl Lungs: Clear bilateral, no increased work of breathing Abdomen:soft, Non-tender, non-distended Extremities: Trace edema, right lateral thigh mild redness and tenderness, no improvement. Dialysis Access: TDC.    OP HD: [facility]  MWF  4h  450/800  117kg  2K/3Ca bath   TDC   Hep 4900 + 500u/hr   Assessment/ Plan: Right leg pain: MRI negative.  The new skin redness and severe pain are suspicious for calciphylaxis.  The calcium based binders and VDRL was discontinued and patient was started on sodium thiosulfate with dialysis MWF.  The skin punch biopsy consistent with infection/cellulitis.  No improvement in skin lesion with antibiotics and it is very tender.  Discussed with Dr. 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  Plan is to continue IV sodium thiosulfate to see if the rash / pain improves.  Right LE cellulitis - status post course of IV antibiotics, blood cultures neg ESRD: MWF, dialysis today.  On midodrine for intradialytic hypotension.  Hopefully he will tolerate ultrafiltration.  Discussed with the dialysis nurse.   Anemia: Hemoglobin at goal.  Continue ESA weekly. Secondary hyperparathyroidism: Calcium and phosphorus level acceptable.  We will do low calcium bath and avoid VDR A or calcium based binders.  Continue Renvela. HTN/volume: Monitor blood pressure. LE edema R thigh> L is stable to improving. On midodrine for intradialytic hypotension. Bed scales are broken.   Atrial fibrillation: tachycardic during dialysis at times.  This may limit ultrafiltration.  He is currently on amiodarone, metoprolol and Eliquis     Kyle Walton 12/18/2020, 9:17 AM   Recent Labs  Lab 12/16/20 0602 12/16/20 0937 12/17/20 0731  K 3.7  --  3.8  BUN 31*  --  41*  CREATININE 3.95*  --  5.07*  CALCIUM 9.0  --  9.2  PHOS  --  3.2 4.5  HGB 10.2*  --  10.4*   Inpatient medications:  acetaminophen  1,000 mg Oral Q8H   amiodarone  200 mg Oral Daily   apixaban  5 mg Oral BID   atorvastatin  40 mg Oral Daily   Chlorhexidine Gluconate Cloth  6 each Topical Q0600   Chlorhexidine Gluconate Cloth  6 each Topical Q0600   Chlorhexidine Gluconate Cloth  6 each Topical Q0600   cholestyramine light  4 g Oral Q12H   clopidogrel  75 mg Oral Daily   darbepoetin (ARANESP) injection - DIALYSIS  60 mcg Intravenous Q Wed-HD   docusate sodium  100 mg Oral BID   insulin aspart  0-15 Units Subcutaneous TID WC   insulin glargine  15 Units Subcutaneous QHS   levothyroxine  12.5 mcg Oral QAC breakfast   lidocaine   Topical TID   metoprolol tartrate  12.5 mg Oral BID   midodrine  10 mg Oral TID WC   pantoprazole  40 mg Oral Daily  polyethylene glycol  17 g Oral Daily   pregabalin  25 mg Oral Daily   senna-docusate  1 tablet Oral BID   sevelamer carbonate  1,600 mg Oral TID WC    sodium thiosulfate infusion for calciphylaxis Stopped (12/17/20 1154)   bisacodyl, hydrOXYzine, naLOXone (NARCAN)  injection, nitroGLYCERIN, ondansetron (ZOFRAN) IV, oxyCODONE

## 2020-12-18 NOTE — Progress Notes (Signed)
Patient has refused both RLE dressing changes.  Will continue to monitor patient.  Earleen Reaper RN

## 2020-12-19 LAB — CBC
HCT: 31.6 % — ABNORMAL LOW (ref 39.0–52.0)
Hemoglobin: 10.2 g/dL — ABNORMAL LOW (ref 13.0–17.0)
MCH: 29 pg (ref 26.0–34.0)
MCHC: 32.3 g/dL (ref 30.0–36.0)
MCV: 89.8 fL (ref 80.0–100.0)
Platelets: 306 10*3/uL (ref 150–400)
RBC: 3.52 MIL/uL — ABNORMAL LOW (ref 4.22–5.81)
RDW: 17.2 % — ABNORMAL HIGH (ref 11.5–15.5)
WBC: 14.5 10*3/uL — ABNORMAL HIGH (ref 4.0–10.5)
nRBC: 0.1 % (ref 0.0–0.2)

## 2020-12-19 LAB — GLUCOSE, CAPILLARY
Glucose-Capillary: 123 mg/dL — ABNORMAL HIGH (ref 70–99)
Glucose-Capillary: 150 mg/dL — ABNORMAL HIGH (ref 70–99)
Glucose-Capillary: 152 mg/dL — ABNORMAL HIGH (ref 70–99)
Glucose-Capillary: 114 mg/dL — ABNORMAL HIGH (ref 70–99)

## 2020-12-19 NOTE — Progress Notes (Signed)
   12/19/20 0914  Assess: MEWS Score  Temp 98.2 F (36.8 C)  BP 114/61  Pulse Rate (!) 112  Resp 18  SpO2 97 %  O2 Device CPAP  Assess: MEWS Score  MEWS Temp 0  MEWS Systolic 0  MEWS Pulse 2  MEWS RR 0  MEWS LOC 0  MEWS Score 2  MEWS Score Color Yellow  Assess: if the MEWS score is Yellow or Red  Were vital signs taken at a resting state? Yes  Focused Assessment No change from prior assessment  Early Detection of Sepsis Score *See Row Information* High  MEWS guidelines implemented *See Row Information* Yes  Treat  MEWS Interventions Administered scheduled meds/treatments  Pain Scale 0-10  Pain Score 0  Notify: Charge Nurse/RN  Name of Charge Nurse/RN Notified Candace Applewhite  Date Charge Nurse/RN Notified 12/19/20  Time Charge Nurse/RN Notified [id number]  Notify: Provider  Provider Name/Title Darliss Cheney  Date Provider Notified 12/19/20  Time Provider Notified [phone]  Notification Type Page  Notification Reason Other (Comment) (patient yellow MEWS previously)  Provider response At bedside  Date of Provider Response 12/19/20  Time of Provider Response [phone]  Document  Patient Outcome Other (Comment) (RN wil continue to monitor this patient)  Progress note created (see row info) Yes

## 2020-12-19 NOTE — TOC Progression Note (Signed)
Transition of Care [facility]) - Progression Note    Patient Details  Name: Kyle Walton MRN: [id number] Date of Birth: 1955-12-19  Transition of Care [facility]) CM/SW [place], [place] Phone Number: 12/19/2020, 3:34 PM  Clinical Narrative:     CSW spoke with pt and spouse yesterday and they confirmed an interest in short term rehab at [facility]. CSW followed up with facility today and they noted they can take pt when insurance auth is back. Insurance is still pending, covid requested and wife updated. SW will continue to follow for DC needs.  Expected Discharge Plan: Skilled Nursing Facility Barriers to Discharge: Continued Medical Work up  Expected Discharge Plan and Services Expected Discharge Plan: [place]                                               Social Determinants of Health (SDOH) Interventions    Readmission Risk Interventions No flowsheet data found.

## 2020-12-19 NOTE — Progress Notes (Signed)
Upon my arrival to room, pt had already placed himself on CPAP without complications.

## 2020-12-19 NOTE — Progress Notes (Signed)
Patient refused to take bath, RN educated patient and will continue to monitor this patient.

## 2020-12-19 NOTE — Progress Notes (Addendum)
[facility] Progress Note  Subjective: seen in room, R thigh pain continues to improve daily. Says he is going to rehab facility in [place].   Vitals:   12/18/20 0534 12/18/20 0930 12/18/20 1832 12/18/20 1953  BP: 94/62 122/74 102/80 (!) 119/52  Pulse: 89 (!) 101 (!) 110 (!) 122  Resp: '18 18 17 18  '$ Temp:  98.1 F (36.7 C) 98.3 F (36.8 C) 98.8 F (37.1 C)  TempSrc:  Oral Oral Oral  SpO2: 98% 93% 94% 94%  Weight:      Height:        Exam: General:NAD, comfortable, able to lie flat Heart:RRR, s1s2 nl Lungs: Clear bilateral, no increased work of breathing Abdomen:soft, Non-tender, non-distended Extremities: Trace edema, right lateral thigh mild redness and sig tenderness. Tenderness has improved some, mild-mod redness remains.  Dialysis Access: TDC.    OP HD: [facility]  MWF  4h  450/800  117kg  2K/3Ca bath   TDC   Hep 4900 + 500u/hr   Assessment/ Plan: Right leg pain: MRI negative.  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  The calcium based binders and VDRA were dc'd and pt started on sodium thiosulfate tiw w/ HD IV.  The skin punch biopsy consistent with infection/cellulitis. We discussed with Dr. 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  Plan is to continue IV sodium thiosulfate and continue to avoid Ca/ vit D products and use 2.0 (low) Ca++ bath. Pain and tenderness are slowly improving. If resolving it can takes months. For dc tomorrow to SNF facility per the pt.  Right LE cellulitis - status post course of IV antibiotics, blood cultures neg ESRD: MWF HD. Next HD tomorrow.  On midodrine for intradialytic hypotension.   Atrial fib w/ RVR - metoprolol 12.5 bid and po amio 200 qd w/ improving heart rates down into the low 100's. BP's soft and midodrine was started here at 10 tid. Continue at dc.  Anemia: Hemoglobin at goal > 10.  Getting Darbe 60 ug weekly here,  continue. Secondary hyperparathyroidism: Calcium and phosphorus level acceptable.  We will do low calcium bath and avoid VDR A or calcium based binders.  Continue Renvela. HTN/volume: Monitor blood pressure. LE edema R thigh> L is stable to improving, no resp issues. On midodrine for intradialytic hypotension. Bed scales are broken.  Anticoagulation - getting eliquis for afib     Rob Genifer Lazenby 12/19/2020, 8:15 AM   Recent Labs  Lab 12/16/20 0937 12/17/20 0731 12/19/20 0550  K  --  3.8 3.9  BUN  --  41* 36*  CREATININE  --  5.07* 4.95*  CALCIUM  --  9.2 9.1  PHOS 3.2 4.5  --   HGB  --  10.4* 10.2*    Inpatient medications:  acetaminophen  1,000 mg Oral Q8H   amiodarone  200 mg Oral Daily   apixaban  5 mg Oral BID   atorvastatin  40 mg Oral Daily   Chlorhexidine Gluconate Cloth  6 each Topical Q0600   Chlorhexidine Gluconate Cloth  6 each Topical Q0600   Chlorhexidine Gluconate Cloth  6 each Topical Q0600   cholestyramine light  4 g Oral Q12H   clopidogrel  75 mg Oral Daily   darbepoetin (ARANESP) injection - DIALYSIS  60 mcg Intravenous Q Wed-HD   docusate sodium  100 mg Oral BID   insulin aspart  0-15 Units Subcutaneous TID WC   insulin  glargine  15 Units Subcutaneous QHS   levothyroxine  12.5 mcg Oral QAC breakfast   lidocaine   Topical TID   metoprolol tartrate  12.5 mg Oral BID   midodrine  10 mg Oral TID WC   pantoprazole  40 mg Oral Daily   polyethylene glycol  17 g Oral Daily   pregabalin  25 mg Oral Daily   senna-docusate  1 tablet Oral BID   sevelamer carbonate  1,600 mg Oral TID WC    sodium thiosulfate infusion for calciphylaxis Stopped (12/17/20 1154)   bisacodyl, hydrOXYzine, naLOXone (NARCAN)  injection, nitroGLYCERIN, ondansetron (ZOFRAN) IV, oxyCODONE

## 2020-12-19 NOTE — Progress Notes (Signed)
PROGRESS NOTE    Kyle Walton  [id number] DOB: 09-10-1955 DOA: 12/03/2020 PCP: Patient, No Pcp Per (Inactive)   Brief Narrative:  Patient is a 64 y.o. male PAF, CAD s/p CABG, AICD implantation, PAD-s/p numerous toe amputations, HTN, ESRD on HD MWF-admitted for right leg soft tissue infection and right upper thigh pain.  See below for further details.  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   - Blood cultures are negative so far.   - Completed a course of vancomycin/cefepime.  See below.   Right lateral thigh pain with worsening erythema: imaging unremarkable for underlying abscess, DVT negative -Unclear etiology, biopsy as above shows diffuse neutrophilic infiltrate without clear etiology -ID consulted to further evaluate for possible antibiotic needs however given patient's clinical improvement and completion of previous antibiotics and only ongoing leukocytosis as sign of infection however leukocytosis has finally started to improve as well. Punch biopsy shows subepidermal edema with diffuse neutrophilic infiltrate involving the dermis extending into the subcutis.  Diagnostic vasculitis is not seen.  PAS staining is negative for fungal hi-fi.  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 -Placed on sodium thiosulfate with dialysis Monday Wednesday Friday per nephrology -Narcotics previously discontinued after multiple issues with an episode of  lethargy requiring Narcan last week however patient does have a real reason for pain and thus I will continue low-dose oxycodone 5 mg every 12 hours as needed. -Gabapentin discontinued due to previous episodes of encephalopathy -Topical lidocaine a very little improvement, Lyrica ongoing   Lower extremity weakness/acute on chronic ambulatory dysfunction:  -Right greater than left - appears to be secondary to pain and not a mechanical/impingement issue -No further recommendations from neurology -PT/OT ongoing, ambulation issues appears to be secondary to pain rather than functional issue.  PT OT recommends SNF.  When discussed in progression today, TOC not aware of those recommendations.  Informed TOC that patient is medically ready for discharge once bed is available at SNF.   Acute toxic encephalopathy, resolved: - 5/29 -patient was given 1 dose of Narcan.  Continue to hold off on narcotics   ESRD on HD MWF: Nephrology following.   HTN: BP on the softer/lower side-continue midodrine- low-dose metoprolol.     Chronic diastolic heart failure: Volume status relatively stable-diuresis with HD.   CAD-s/p CABG 2017: No anginal symptoms-on Plavix/statin/beta-blocker   History of [id number] amiodarone, metoprolol and Eliquis.   History of AICD implantation, stable   History of PAD-s/p multiple toe amputations bilaterally, stable   DM-2, uncontrolled with hyperglycemia: Blood sugar fairly controlled, continue Lantus 15 units and SSI.   Hypothyroidism: Continue Synthroid   OSA: Continue CPAP nightly   Legally blind   Morbid Obesity: Estimated body mass index is 49.22 kg/m as calculated from the following:   Height as of this encounter: '5\' 10"'$  (1.778 m).   Weight as of this encounter: 155.6 kg.    Long QT C: Will check EKG tomorrow  DVT prophylaxis:    Code Status: Full Code  Family Communication: None present at bedside.  Plan of care discussed with patient in length and he  verbalized understanding and agreed with it.  Status is: Inpatient  Remains inpatient appropriate because:Unsafe d/c plan  Dispo: The patient is from: Home              Anticipated d/c is to: SNF, TOC was made aware early morning of 12/16/2020 about patient being medically stable for discharge.  Waiting for arrangements for SNF.              Patient currently is medically stable to d/c.   Difficult to place patient No        Estimated body mass index is 47.35 kg/m as calculated from the following:   Height as of this encounter: '5\' 10"'$  (1.778 m).   Weight as of this encounter: 149.7 kg.  Pressure Injury 12/11/20 Sacrum Right;Left;Mid Stage 2 -  Partial thickness loss of dermis presenting as a shallow open injury with a red, pink wound bed without slough. (Active)  12/11/20 0551  Location: Sacrum  Location Orientation: Right;Left;Mid  Staging: Stage 2 -  Partial thickness loss of dermis presenting as a shallow open injury with a red, pink wound bed without slough.  Wound Description (Comments):   Present on Admission:      Nutritional status:               Consultants:  Nephrology ID  Procedures:  Punch biopsy  Antimicrobials:  Anti-infectives (From admission, onward)    Start     Dose/Rate Route Frequency Ordered Stop   12/08/20 1007  vancomycin (VANCOCIN) 1-5 GM/200ML-% IVPB       Note to Pharmacy: Judieth Keens  : cabinet override      12/08/20 1007 12/08/20 1102   12/06/20 1203  vancomycin (VANCOCIN) 1-5 GM/200ML-% IVPB       Note to Pharmacy: Wallace Cullens   : cabinet override      12/06/20 1203 12/06/20 1206   12/04/20 1030  vancomycin (VANCOREADY) IVPB 1000 mg/200 mL        1,000 mg 200 mL/hr over 60 Minutes Intravenous  Once 12/04/20 0930 12/04/20 1230   12/03/20 2300  vancomycin (VANCOREADY) IVPB 1000 mg/200 mL        1,000 mg 200 mL/hr over 60 Minutes Intravenous Every M-W-F (Hemodialysis) 12/03/20 2019 12/09/20 2359   12/03/20 1830   ceFEPIme (MAXIPIME) 1 g in sodium chloride 0.9 % 100 mL IVPB  Status:  Discontinued        1 g 200 mL/hr over 30 Minutes Intravenous Every 24 hours 12/03/20 1802 12/05/20 1630   12/03/20 1800  vancomycin (VANCOREADY) IVPB 1000 mg/200 mL  Status:  Discontinued        1,000 mg 200 mL/hr over 60 Minutes Intravenous  Once 12/03/20 1756 12/03/20 1801   12/03/20 1300  vancomycin (VANCOREADY) IVPB 1750 mg/350 mL        1,750 mg 175 mL/hr over 120 Minutes Intravenous  Once 12/03/20 1252 12/03/20 1706   12/03/20 1300  cefTRIAXone (ROCEPHIN) 2 g in sodium chloride 0.9 % 100 mL IVPB        2 g 200 mL/hr over 30 Minutes Intravenous  Once 12/03/20 1252 12/03/20 1345          Subjective: Seen and examined.  He has no new complaint  Objective: Vitals:   12/18/20 1832 12/18/20 1953 12/19/20 0914 12/19/20 1008  BP: 102/80 Marland Kitchen)  119/52 114/61   Pulse: (!) 110 (!) 122 (!) 112 (!) 105  Resp: '17 18 18   '$ Temp: 98.3 F (36.8 C) 98.8 F (37.1 C) 98.2 F (36.8 C)   TempSrc: Oral Oral Oral   SpO2: 94% 94% 97%   Weight:      Height:        Intake/Output Summary (Last 24 hours) at 12/19/2020 1014 Last data filed at 12/19/2020 0915 Gross per 24 hour  Intake 1080 ml  Output 0 ml  Net 1080 ml    Filed Weights   12/15/20 1240 12/17/20 0800 12/17/20 1207  Weight: 131 kg (!) 151.5 kg (!) 149.7 kg    Examination:  General exam: Appears calm and comfortable  Respiratory system: Clear to auscultation. Respiratory effort normal. Cardiovascular system: S1 & S2 heard, RRR. No JVD, murmurs, rubs, gallops or clicks. No pedal edema. Gastrointestinal system: Abdomen is nondistended, soft and nontender. No organomegaly or masses felt. Normal bowel sounds heard. Central nervous system: Alert and oriented. No focal neurological deficits. Extremities: Slightly decreased strength in right lower extremity due to pain. Skin: Hyperpigmentation on the lateral side of the right thigh with epidermal edema, tenderness  to palpation but no warmth. Psychiatry: Judgement and insight appear normal. Mood & affect appropriate.    Data Reviewed: I have personally reviewed following labs and imaging studies  CBC: Recent Labs  Lab 12/13/20 0545 12/15/20 0836 12/16/20 0602 12/17/20 0731 12/19/20 0550  WBC 24.5* 21.7* 20.2* 20.7* 14.5*  HGB 10.6* 9.6* 10.2* 10.4* 10.2*  HCT 32.5* 30.2* 31.7* 32.4* 31.6*  MCV 89.5 91.2 90.1 90.5 89.8  PLT 248 256 257 317 [id number]    Basic Metabolic Panel: Recent Labs  Lab 12/13/20 0545 12/15/20 0836 12/16/20 0602 12/16/20 0937 12/17/20 0731 12/19/20 0550  NA 137 135 137  --  137 139  K 3.9 4.1 3.7  --  3.8 3.9  CL 95* 98 95*  --  97* 96*  CO2 '24 24 22  '$ --  22 22  GLUCOSE 160* 241* 198*  --  134* 136*  BUN 42* 49* 31*  --  41* 36*  CREATININE 5.90* 5.10* 3.95*  --  5.07* 4.95*  CALCIUM 9.4 9.0 9.0  --  9.2 9.1  PHOS  --  4.4  --  3.2 4.5  --     GFR: Estimated Creatinine Clearance: 21.8 mL/min (A) (by C-G formula based on SCr of 4.95 mg/dL (H)). Liver Function Tests: Recent Labs  Lab 12/13/20 0545 12/15/20 0836 12/17/20 0731  AST 31  --   --   ALT 10  --   --   ALKPHOS 115  --   --   BILITOT 1.1  --   --   PROT 6.6  --   --   ALBUMIN 2.3* 2.1* 2.4*    No results for input(s): LIPASE, AMYLASE in the last 168 hours. No results for input(s): AMMONIA in the last 168 hours. Coagulation Profile: No results for input(s): INR, PROTIME in the last 168 hours. Cardiac Enzymes: No results for input(s): CKTOTAL, CKMB, CKMBINDEX, TROPONINI in the last 168 hours. BNP (last 3 results) No results for input(s): PROBNP in the last 8760 hours. HbA1C: No results for input(s): HGBA1C in the last 72 hours. CBG: Recent Labs  Lab 12/18/20 0643 12/18/20 1130 12/18/20 1654 12/18/20 1950 12/19/20 0700  GLUCAP 201* 139* 165* 205* 123*    Lipid Profile: No results for input(s): CHOL, HDL, LDLCALC, TRIG, CHOLHDL, LDLDIRECT in the last  72 hours. Thyroid Function  Tests: No results for input(s): TSH, T4TOTAL, FREET4, T3FREE, THYROIDAB in the last 72 hours. Anemia Panel: No results for input(s): VITAMINB12, FOLATE, FERRITIN, TIBC, IRON, RETICCTPCT in the last 72 hours. Sepsis Labs: No results for input(s): PROCALCITON, LATICACIDVEN in the last 168 hours.  Recent Results (from the past 240 hour(s))  MRSA PCR Screening     Status: None   Collection Time: 12/15/20  8:23 AM   Specimen: Nasopharyngeal  Result Value Ref Range Status   MRSA by PCR NEGATIVE NEGATIVE Final    Comment:        The GeneXpert MRSA Assay (FDA approved for NASAL specimens only), is one component of a comprehensive MRSA colonization surveillance program. It is not intended to diagnose MRSA infection nor to guide or monitor treatment for MRSA infections. Performed at [facility], [place] [address] [place], [place] [address]        Radiology Studies: No results found.  Scheduled Meds:  acetaminophen  1,000 mg Oral Q8H   amiodarone  200 mg Oral Daily   apixaban  5 mg Oral BID   atorvastatin  40 mg Oral Daily   Chlorhexidine Gluconate Cloth  6 each Topical Q0600   Chlorhexidine Gluconate Cloth  6 each Topical Q0600   Chlorhexidine Gluconate Cloth  6 each Topical Q0600   cholestyramine light  4 g Oral Q12H   clopidogrel  75 mg Oral Daily   darbepoetin (ARANESP) injection - DIALYSIS  60 mcg Intravenous Q Wed-HD   docusate sodium  100 mg Oral BID   insulin aspart  0-15 Units Subcutaneous TID WC   insulin glargine  15 Units Subcutaneous QHS   levothyroxine  12.5 mcg Oral QAC breakfast   lidocaine   Topical TID   metoprolol tartrate  12.5 mg Oral BID   midodrine  10 mg Oral TID WC   pantoprazole  40 mg Oral Daily   polyethylene glycol  17 g Oral Daily   pregabalin  25 mg Oral Daily   senna-docusate  1 tablet Oral BID   sevelamer carbonate  1,600 mg Oral TID WC   Continuous Infusions:  sodium thiosulfate infusion for calciphylaxis Stopped (12/17/20 1154)      LOS: 16 days   Time spent: 26 minutes   Darliss Cheney, MD [facility]  12/19/2020, 10:14 AM   How to contact the [facility] Attending or Consulting provider [place] or covering provider during after hours [place], for this patient?  Check the care team in [facility] and look for a) attending/consulting TRH provider listed and b) the [facility] team listed. Page or secure chat [phone]. Log into [email or web address] and use [place]'s universal password to access. If you do not have the password, please contact the hospital operator. Locate the [facility] provider you are looking for under [facility] and page to a number that you can be directly reached. If you still have difficulty reaching the provider, please page the [facility] (Director on Call) for the Hospitalists listed on amion for assistance.

## 2020-12-19 NOTE — Progress Notes (Signed)
Patient refused wound care, RN educated patient and will continue to monitor this patient.

## 2020-12-19 NOTE — Progress Notes (Signed)
Patient's wfie Fannie Monastra has requested to speak with MD regarding her husband's plan of care, MD has been notified and RN will continue to monitor this patient.

## 2020-12-20 LAB — MAGNESIUM: Magnesium: 2 mg/dL (ref 1.7–2.4)

## 2020-12-20 LAB — CBC WITH DIFFERENTIAL/PLATELET
Abs Immature Granulocytes: 0.14 10*3/uL — ABNORMAL HIGH (ref 0.00–0.07)
Basophils Absolute: 0.1 10*3/uL (ref 0.0–0.1)
Basophils Relative: 1 %
Eosinophils Absolute: 0.1 10*3/uL (ref 0.0–0.5)
Eosinophils Relative: 1 %
HCT: 33.9 % — ABNORMAL LOW (ref 39.0–52.0)
Hemoglobin: 10.8 g/dL — ABNORMAL LOW (ref 13.0–17.0)
Immature Granulocytes: 1 %
Lymphocytes Relative: 8 %
Lymphs Abs: 1.2 10*3/uL (ref 0.7–4.0)
MCH: 28.6 pg (ref 26.0–34.0)
MCHC: 31.9 g/dL (ref 30.0–36.0)
MCV: 89.7 fL (ref 80.0–100.0)
Monocytes Absolute: 1.3 10*3/uL — ABNORMAL HIGH (ref 0.1–1.0)
Monocytes Relative: 8 %
Neutro Abs: 12.5 10*3/uL — ABNORMAL HIGH (ref 1.7–7.7)
Neutrophils Relative %: 81 %
Platelets: 338 10*3/uL (ref 150–400)
RBC: 3.78 MIL/uL — ABNORMAL LOW (ref 4.22–5.81)
RDW: 17.2 % — ABNORMAL HIGH (ref 11.5–15.5)
WBC: 15.3 10*3/uL — ABNORMAL HIGH (ref 4.0–10.5)
nRBC: 0 % (ref 0.0–0.2)

## 2020-12-20 LAB — GLUCOSE, CAPILLARY
Glucose-Capillary: 117 mg/dL — ABNORMAL HIGH (ref 70–99)
Glucose-Capillary: 132 mg/dL — ABNORMAL HIGH (ref 70–99)
Glucose-Capillary: 103 mg/dL — ABNORMAL HIGH (ref 70–99)
Glucose-Capillary: 131 mg/dL — ABNORMAL HIGH (ref 70–99)

## 2020-12-20 LAB — RESP PANEL BY RT-PCR (FLU A&B, COVID) ARPGX2
Influenza A by PCR: NEGATIVE
Influenza B by PCR: NEGATIVE
SARS Coronavirus 2 by RT PCR: NEGATIVE

## 2020-12-20 MED ORDER — DIGOXIN 125 MCG PO TABS
0.0625 mg | ORAL_TABLET | ORAL | Status: DC
  Administered 2020-12-22 – 2020-12-30 (×4): 0.0625 mg via ORAL
  Filled 2020-12-20 (×6): qty 1

## 2020-12-20 MED ORDER — BASAGLAR KWIKPEN 100 UNIT/ML ~~LOC~~ SOPN: 15.0000 [IU] | PEN_INJECTOR | Freq: Every day | SUBCUTANEOUS | 0 refills | Status: AC

## 2020-12-20 MED ORDER — METOPROLOL TARTRATE 25 MG PO TABS
25.0000 mg | ORAL_TABLET | Freq: Two times a day (BID) | ORAL | Status: DC
  Administered 2020-12-20: 25 mg via ORAL
  Filled 2020-12-20: qty 1

## 2020-12-20 MED ORDER — MIDODRINE HCL 10 MG PO TABS: 10.0000 mg | ORAL_TABLET | Freq: Three times a day (TID) | ORAL | 0 refills | Status: DC

## 2020-12-20 MED ORDER — HEPARIN SODIUM (PORCINE) 1000 UNIT/ML IJ SOLN
INTRAMUSCULAR | Status: AC
  Filled 2020-12-20: qty 5

## 2020-12-20 MED ORDER — HEPARIN SODIUM (PORCINE) 1000 UNIT/ML IJ SOLN
INTRAMUSCULAR | Status: AC
  Filled 2020-12-20: qty 1

## 2020-12-20 MED ORDER — HEPARIN SODIUM (PORCINE) 1000 UNIT/ML IJ SOLN
INTRAMUSCULAR | Status: AC
  Administered 2020-12-20: 1000 [IU]
  Filled 2020-12-20: qty 5

## 2020-12-20 MED ORDER — APIXABAN 5 MG PO TABS: 5.0000 mg | ORAL_TABLET | Freq: Two times a day (BID) | ORAL | 0 refills | Status: AC

## 2020-12-20 MED ORDER — METOPROLOL SUCCINATE ER 25 MG PO TB24
12.5000 mg | ORAL_TABLET | Freq: Every day | ORAL | Status: DC
  Administered 2020-12-21 – 2020-12-30 (×10): 12.5 mg via ORAL
  Filled 2020-12-20 (×11): qty 1

## 2020-12-20 MED ORDER — CHOLESTYRAMINE LIGHT 4 G PO PACK: 4.0000 g | PACK | Freq: Two times a day (BID) | ORAL | 0 refills | Status: AC

## 2020-12-20 MED ORDER — PREGABALIN 25 MG PO CAPS
25.0000 mg | ORAL_CAPSULE | Freq: Every day | ORAL | 0 refills | Status: DC
Start: 2020-12-21 — End: 2021-01-06

## 2020-12-20 MED ORDER — AMIODARONE HCL 200 MG PO TABS
400.0000 mg | ORAL_TABLET | Freq: Two times a day (BID) | ORAL | Status: DC
  Administered 2020-12-20 – 2020-12-31 (×22): 400 mg via ORAL
  Filled 2020-12-20 (×22): qty 2

## 2020-12-20 MED ORDER — ACETAMINOPHEN 325 MG PO TABS
650.0000 mg | ORAL_TABLET | Freq: Four times a day (QID) | ORAL | Status: DC | PRN
  Administered 2020-12-26: 650 mg via ORAL
  Filled 2020-12-20: qty 2

## 2020-12-20 MED ORDER — OXYCODONE HCL 5 MG PO TABS: 5.0000 mg | ORAL_TABLET | Freq: Two times a day (BID) | ORAL | 0 refills | Status: DC | PRN

## 2020-12-20 MED ORDER — DIGOXIN 250 MCG PO TABS
0.2500 mg | ORAL_TABLET | Freq: Three times a day (TID) | ORAL | Status: AC
  Administered 2020-12-20 – 2020-12-21 (×3): 0.25 mg via ORAL
  Filled 2020-12-20 (×2): qty 1
  Filled 2020-12-20 (×2): qty 2
  Filled 2020-12-20: qty 1
  Filled 2020-12-20: qty 2

## 2020-12-20 NOTE — Care Management Important Message (Addendum)
Important Message  Patient Details  Name: Mattox Buscaglia MRN: [id number] Date of Birth: 05/24/1956   Medicare Important Message Given:  Yes - Important Message mailed due to current National Emergency  Verbal consent obtained due to current National Emergency  Relationship to patient: Self Elenore Rota Call Date: 12/20/20  Time: 1312 Phone: [phone] Outcome: No Answer/Busy Important Message mailed to: Patient address on file    [name] 12/20/2020, 1:13 PM

## 2020-12-20 NOTE — TOC Progression Note (Addendum)
Transition of Care [facility]) - Progression Note    Patient Details  Name: Kyle Walton MRN: [id number] Date of Birth: 10/25/1955  [facility] CM/SW Contact  Bartholomew Crews, RN Phone Number: [phone] 12/20/2020, 6:03 PM  Clinical Narrative:     Spoke with patient's wife, Kyle Walton, at [phone] to discuss transition planning.   11 months ago patient could climb up the steps at home to get to upstairs bath and bedroom. Patient has declined over the months that a stair lift was installed; however, patient would still need to climb 1 step as the stairwell turned. More recently patient has been sleeping on couch downstairs and taking bird baths. He is anuric. When he has a bowel movement, his wife describes it as a Water engineer." He does not have a bedside commode. There is a half bath downstairs that he would use his hoverround to get to the toilet, stand and pivot.   There is floor to ceiling handicap pole in the bathroom upstairs and handrails. He takes "bird baths" more recently with her assistance. He can feed himself.   There is an adjustable bed at home. Wife is willing to have a hospital bed placed in the dining room if needed, and agrees a bedside commode and overbed table would be beneficial. Ideally, she would like patient to be able to stand alone and pivot and take a few steps.   He has a hoverround which he also uses to get to dialysis on MWF via Medicaid transportation; however, patient does not have full Medicaid and only gets transportation benefit. Patient has an Transport planner that is road worthy and is used to go out to eat where patient is able to transfer from scooter to chair.   There is a ramp installed to get in/out of the home.   Wife is also a dialysis patient on TTS schedule at [facility]. She stated the alternate schedule is intentional to allow her time to rest. The couple has 19 kids between the two of them, but everyone is scattered and are not  available to assist. One son is nearby, but is not involved in day to day activities.   TOC following for transition needs.   Expected Discharge Plan: Skilled Nursing Facility Barriers to Discharge: Continued Medical Work up  Expected Discharge Plan and Services Expected Discharge Plan: [place]         Expected Discharge Date: 12/20/20                                     Social Determinants of Health (SDOH) Interventions    Readmission Risk Interventions No flowsheet data found.

## 2020-12-20 NOTE — Progress Notes (Signed)
Renal Navigator contacted patient's outpatient HD clinic/[facility]. His schedule is MWF 11:45am. He needs to arrive at 11:30am. Patient has Medicaid, and per Case Manager's discussion with his wife, he rides with [employer] to HD from home. He will need Medicaid Transportation [phone]) arranged by [facility] for transportation from SNF once discharge date is determined.   Alphonzo Cruise, [place] Renal Navigator [phone]

## 2020-12-20 NOTE — TOC Progression Note (Addendum)
Transition of Care [facility]) - Progression Note    Patient Details  Name: Kyle Walton MRN: [id number] Date of Birth: 1956/02/10  Transition of Care [facility]) CM/SW Contact  Bartholomew Crews, RN Phone Number: [phone] 12/20/2020, 1:36 PM  Clinical Narrative:     Spoke with patient's spouse, Delcie Roch, at [phone]. Patient has received covid vaccines and booster, but she does not have the documentation. Patient's dialysis transportation is Medicaid transportation. DSS will need to be notified for transportation arrangements for dialysis once discharge date is known.   Insurance authorization has not been received. Spoke with Juliann Pulse to discuss dialysis transportation. Advised of covid vaccine status. Currently [facility] does not have any male beds d/t covid outbreak over weekend. There may be a bed available the end of week. Juliann Pulse inquired about patient "behaviors" noted in chart. Spoke with nursing. Patient has not demonstration specific aggressive behaviors verbally or physically; however, he sometimes refuses care and is "short" in his coversation.   TOC following for transition needs.   Update: Spoke with wife to discuss no current bed available at [facility], but maybe end of week. She is agreeable with seeking bed at other facility if bed offered. Discussed history of "behavior issues." She stated that in the last year that patient's medications were "screwed up" and he began talking out of his head and making "threats." However, this resolved with correct medication dosing. She stated that he can get frustrated with feeling like he is always told what to do and when to do it, but no aggression.   Update: Attempted to reach out to other facilities. Left voicemail for [facility]; [facility] is FULL; [facility] has beds, but initially declined - left message to discuss; and faxed patient infor to [place]. [facility] looking at patient, however, he would need to get a transient seat at the [facility] dialysis center.   Expected Discharge Plan: Skilled Nursing Facility Barriers to Discharge: Continued Medical Work up  Expected Discharge Plan and Services Expected Discharge Plan: [place]         Expected Discharge Date: 12/20/20                                     Social Determinants of Health (SDOH) Interventions    Readmission Risk Interventions No flowsheet data found.

## 2020-12-20 NOTE — Progress Notes (Signed)
PROGRESS NOTE    Kyle Walton  [id number] DOB: 03-Oct-1955 DOA: 12/03/2020 PCP: Patient, No Pcp Per (Inactive)   Brief Narrative:  Patient is a 65 y.o. male PAF, CAD s/p CABG, AICD implantation, PAD-s/p numerous toe amputations, HTN, ESRD on HD MWF-admitted for right leg soft tissue infection and right upper thigh pain.  See below for further details.  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   - Blood cultures are negative so far.   - Completed a course of vancomycin/cefepime.  See below.   Right lateral thigh pain with worsening erythema: imaging unremarkable for underlying abscess, DVT negative -Unclear etiology, biopsy as above shows diffuse neutrophilic infiltrate without clear etiology -ID consulted to further evaluate for possible antibiotic needs however given patient's clinical improvement and completion of previous antibiotics and only ongoing leukocytosis as sign of infection however leukocytosis has finally started to improve as well. Punch biopsy shows subepidermal edema with diffuse neutrophilic infiltrate involving the dermis extending into the subcutis.  Diagnostic vasculitis is not seen.  PAS staining is negative for fungal hi-fi.  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 -Placed on sodium thiosulfate with dialysis Monday Wednesday Friday per nephrology -Narcotics previously discontinued after multiple issues with an episode of  lethargy requiring Narcan last week however patient does have a real reason for pain and thus I will continue low-dose oxycodone 5 mg every 12 hours as needed. -Gabapentin discontinued due to previous episodes of encephalopathy -Topical lidocaine a very little improvement, Lyrica ongoing   Lower extremity weakness/acute on chronic ambulatory dysfunction:  -Right greater than left - appears to be secondary to pain and not a mechanical/impingement issue -No further recommendations from neurology -PT/OT ongoing, ambulation issues appears to be secondary to pain rather than functional issue.  PT OT recommends SNF.  When discussed in progression today, TOC not aware of those recommendations.  Informed TOC that patient is medically ready for discharge once bed is available at SNF.  I was informed today that insurance authorization is pending for [facility] however they also have [place] outbreak and they do not have any male bed available until end of this week and patient does not have any other bed offer from either facility so he is not going to be discharged today.   Acute toxic encephalopathy, resolved: - 5/29 -patient was given 1 dose of Narcan.  Continue to hold off on narcotics   ESRD on HD MWF: Nephrology following.   HTN: BP on the softer/lower side-continue midodrine- low-dose metoprolol.     Chronic diastolic heart failure: Volume status relatively stable-diuresis with HD.   CAD-s/p CABG 2017: No anginal symptoms-on Plavix/statin/beta-blocker   History of [id number] amiodarone, metoprolol and Eliquis.   History of AICD implantation, stable   History of PAD-s/p multiple toe amputations bilaterally, stable   DM-2, uncontrolled with hyperglycemia: Blood sugar fairly controlled, continue Lantus 15 units and SSI.   Hypothyroidism: Continue Synthroid   OSA: Continue CPAP  nightly   Legally blind   Morbid Obesity: Estimated body mass index is 49.22 kg/m as calculated from the  following:   Height as of this encounter: '5\' 10"'$  (1.778 m).   Weight as of this encounter: 155.6 kg.    Long QT C/sinus tachycardia: EKG shows slightly worsened QTC today.  Due to tachycardia, I have increased his metoprolol to 25 mg twice daily.  Will discontinue QTC prolonging medications which are not necessary including hydroxyzine, omeprazole.  I have consulted cardiology to take a look at his EKG and suggest if further investigation is needed.  DVT prophylaxis:    Code Status: Full Code  Family Communication: None present at bedside.  Plan of care discussed with patient in length and he verbalized understanding and agreed with it.  Discussed with his wife as well.  Status is: Inpatient  Remains inpatient appropriate because:Unsafe d/c plan  Dispo: The patient is from: Home              Anticipated d/c is to: SNF,               Patient currently is medically stable to d/c.   Difficult to place patient No        Estimated body mass index is 47.35 kg/m as calculated from the following:   Height as of this encounter: '5\' 10"'$  (1.778 m).   Weight as of this encounter: 149.7 kg.  Pressure Injury 12/11/20 Sacrum Right;Left;Mid Stage 2 -  Partial thickness loss of dermis presenting as a shallow open injury with a red, pink wound bed without slough. (Active)  12/11/20 0551  Location: Sacrum  Location Orientation: Right;Left;Mid  Staging: Stage 2 -  Partial thickness loss of dermis presenting as a shallow open injury with a red, pink wound bed without slough.  Wound Description (Comments):   Present on Admission:      Nutritional status:               Consultants:  Nephrology ID  Procedures:  Punch biopsy  Antimicrobials:  Anti-infectives (From admission, onward)    Start     Dose/Rate Route Frequency Ordered Stop   12/08/20 1007  vancomycin (VANCOCIN) 1-5 GM/200ML-% IVPB       Note to Pharmacy: Judieth Keens  : cabinet override      12/08/20 1007  12/08/20 1102   12/06/20 1203  vancomycin (VANCOCIN) 1-5 GM/200ML-% IVPB       Note to Pharmacy: Wallace Cullens   : cabinet override      12/06/20 1203 12/06/20 1206   12/04/20 1030  vancomycin (VANCOREADY) IVPB 1000 mg/200 mL        1,000 mg 200 mL/hr over 60 Minutes Intravenous  Once 12/04/20 0930 12/04/20 1230   12/03/20 2300  vancomycin (VANCOREADY) IVPB 1000 mg/200 mL        1,000 mg 200 mL/hr over 60 Minutes Intravenous Every M-W-F (Hemodialysis) 12/03/20 2019 12/09/20 2359   12/03/20 1830  ceFEPIme (MAXIPIME) 1 g in sodium chloride 0.9 % 100 mL IVPB  Status:  Discontinued        1 g 200 mL/hr over 30 Minutes Intravenous Every 24 hours 12/03/20 1802 12/05/20 1630   12/03/20 1800  vancomycin (VANCOREADY) IVPB 1000 mg/200 mL  Status:  Discontinued        1,000 mg 200 mL/hr over 60 Minutes Intravenous  Once 12/03/20 1756 12/03/20 1801   12/03/20 1300  vancomycin (VANCOREADY) IVPB 1750 mg/350 mL  1,750 mg 175 mL/hr over 120 Minutes Intravenous  Once 12/03/20 1252 12/03/20 1706   12/03/20 1300  cefTRIAXone (ROCEPHIN) 2 g in sodium chloride 0.9 % 100 mL IVPB        2 g 200 mL/hr over 30 Minutes Intravenous  Once 12/03/20 1252 12/03/20 1345          Subjective: Patient seen and examined.  He has no new complaint.  He is fully alert and oriented.  Objective: Vitals:   12/19/20 1633 12/19/20 1959 12/20/20 0602 12/20/20 0931  BP: 116/66 105/72 96/68 112/67  Pulse: (!) 102 (!) 120 (!) 117 88  Resp: '20 19 19 20  '$ Temp: 98.4 F (36.9 C) (!) 97.3 F (36.3 C) 97.7 F (36.5 C) 98 F (36.7 C)  TempSrc: Oral Oral Oral Oral  SpO2: 94% 95% 97% 98%  Weight:      Height:        Intake/Output Summary (Last 24 hours) at 12/20/2020 1340 Last data filed at 12/20/2020 0900 Gross per 24 hour  Intake 960 ml  Output 0 ml  Net 960 ml    Filed Weights   12/15/20 1240 12/17/20 0800 12/17/20 1207  Weight: 131 kg (!) 151.5 kg (!) 149.7 kg    Examination:  General exam: Appears  calm and comfortable  Respiratory system: Clear to auscultation. Respiratory effort normal. Cardiovascular system: S1 & S2 heard, RRR. No JVD, murmurs, rubs, gallops or clicks. No pedal edema. Gastrointestinal system: Abdomen is nondistended, soft and nontender. No organomegaly or masses felt. Normal bowel sounds heard. Central nervous system: Alert and oriented. No focal neurological deficits. Extremities: Slightly decreased strength in right lower extremity due to pain. Skin: Hyperpigmentation and epidermal edema at right lateral thigh which is tender to palpation but not warm. Psychiatry: Judgement and insight appear normal. Mood & affect appropriate.   Data Reviewed: I have personally reviewed following labs and imaging studies  CBC: Recent Labs  Lab 12/15/20 0836 12/16/20 0602 12/17/20 0731 12/19/20 0550  WBC 21.7* 20.2* 20.7* 14.5*  HGB 9.6* 10.2* 10.4* 10.2*  HCT 30.2* 31.7* 32.4* 31.6*  MCV 91.2 90.1 90.5 89.8  PLT 256 257 317 [id number]    Basic Metabolic Panel: Recent Labs  Lab 12/15/20 0836 12/16/20 0602 12/16/20 0937 12/17/20 0731 12/19/20 0550  NA 135 137  --  137 139  K 4.1 3.7  --  3.8 3.9  CL 98 95*  --  97* 96*  CO2 24 22  --  22 22  GLUCOSE 241* 198*  --  134* 136*  BUN 49* 31*  --  41* 36*  CREATININE 5.10* 3.95*  --  5.07* 4.95*  CALCIUM 9.0 9.0  --  9.2 9.1  PHOS 4.4  --  3.2 4.5  --     GFR: Estimated Creatinine Clearance: 21.8 mL/min (A) (by C-G formula based on SCr of 4.95 mg/dL (H)). Liver Function Tests: Recent Labs  Lab 12/15/20 0836 12/17/20 0731  ALBUMIN 2.1* 2.4*    No results for input(s): LIPASE, AMYLASE in the last 168 hours. No results for input(s): AMMONIA in the last 168 hours. Coagulation Profile: No results for input(s): INR, PROTIME in the last 168 hours. Cardiac Enzymes: No results for input(s): CKTOTAL, CKMB, CKMBINDEX, TROPONINI in the last 168 hours. BNP (last 3 results) No results for input(s): PROBNP in the last 8760  hours. HbA1C: No results for input(s): HGBA1C in the last 72 hours. CBG: Recent Labs  Lab 12/19/20 1130 12/19/20 1630 12/19/20 2209 12/20/20  [id number] 12/20/20 1129  GLUCAP 150* 152* 114* 117* 132*    Lipid Profile: No results for input(s): CHOL, HDL, LDLCALC, TRIG, CHOLHDL, LDLDIRECT in the last 72 hours. Thyroid Function Tests: No results for input(s): TSH, T4TOTAL, FREET4, T3FREE, THYROIDAB in the last 72 hours. Anemia Panel: No results for input(s): VITAMINB12, FOLATE, FERRITIN, TIBC, IRON, RETICCTPCT in the last 72 hours. Sepsis Labs: No results for input(s): PROCALCITON, LATICACIDVEN in the last 168 hours.  Recent Results (from the past 240 hour(s))  MRSA PCR Screening     Status: None   Collection Time: 12/15/20  8:23 AM   Specimen: Nasopharyngeal  Result Value Ref Range Status   MRSA by PCR NEGATIVE NEGATIVE Final    Comment:        The GeneXpert MRSA Assay (FDA approved for NASAL specimens only), is one component of a comprehensive MRSA colonization surveillance program. It is not intended to diagnose MRSA infection nor to guide or monitor treatment for MRSA infections. Performed at [facility], [place] [address] [place], [place] [address]   Resp Panel by RT-PCR (Flu A&B, Covid) Nasopharyngeal Swab     Status: None   Collection Time: 12/20/20  8:06 AM   Specimen: Nasopharyngeal Swab; Nasopharyngeal(NP) swabs in vial transport medium  Result Value Ref Range Status   SARS Coronavirus 2 by RT PCR NEGATIVE NEGATIVE Final    Comment: (NOTE) SARS-CoV-2 target nucleic acids are NOT DETECTED.  The SARS-CoV-2 RNA is generally detectable in upper respiratory specimens during the acute phase of infection. The lowest concentration of SARS-CoV-2 viral copies this assay can detect is 138 copies/mL. A negative result does not preclude SARS-Cov-2 infection and should not be used as the sole basis for treatment or other patient management decisions. A negative result may  occur with  improper specimen collection/handling, submission of specimen other than nasopharyngeal swab, presence of viral mutation(s) within the areas targeted by this assay, and inadequate number of viral copies(<138 copies/mL). A negative result must be combined with clinical observations, patient history, and epidemiological information. The expected result is Negative.  Fact Sheet for Patients:  EntrepreneurPulse.com.au  Fact Sheet for Healthcare Providers:  IncredibleEmployment.be  This test is no t yet approved or cleared by the [place] FDA and  has been authorized for detection and/or diagnosis of SARS-CoV-2 by FDA under an Emergency Use Authorization (EUA). This EUA will remain  in effect (meaning this test can be used) for the duration of the COVID-19 declaration under Section 564(b)(1) of the Act, 21 U.S.C.section 360bbb-3(b)(1), unless the authorization is terminated  or revoked sooner.       Influenza A by PCR NEGATIVE NEGATIVE Final   Influenza B by PCR NEGATIVE NEGATIVE Final    Comment: (NOTE) The Xpert Xpress SARS-CoV-2/FLU/RSV plus assay is intended as an aid in the diagnosis of influenza from Nasopharyngeal swab specimens and should not be used as a sole basis for treatment. Nasal washings and aspirates are unacceptable for Xpert Xpress SARS-CoV-2/FLU/RSV testing.  Fact Sheet for Patients: EntrepreneurPulse.com.au  Fact Sheet for Healthcare Providers: IncredibleEmployment.be  This test is not yet approved or cleared by the [place] FDA and has been authorized for detection and/or diagnosis of SARS-CoV-2 by FDA under an Emergency Use Authorization (EUA). This EUA will remain in effect (meaning this test can be used) for the duration of the COVID-19 declaration under Section 564(b)(1) of the Act, 21 U.S.C. section 360bbb-3(b)(1), unless the authorization is terminated  or revoked.  Performed at [facility], [place] [address], [place], [place] [address]        Radiology Studies: No results found.  Scheduled Meds:  heparin sodium (porcine)       acetaminophen  1,000 mg Oral Q8H   amiodarone  200 mg Oral Daily   apixaban  5 mg Oral BID   atorvastatin  40 mg Oral Daily   Chlorhexidine Gluconate Cloth  6 each Topical Q0600   Chlorhexidine Gluconate Cloth  6 each Topical Q0600   Chlorhexidine Gluconate Cloth  6 each Topical Q0600   cholestyramine light  4 g Oral Q12H   clopidogrel  75 mg Oral Daily   darbepoetin (ARANESP) injection - DIALYSIS  60 mcg Intravenous Q Wed-HD   docusate sodium  100 mg Oral BID   insulin aspart  0-15 Units Subcutaneous TID WC   insulin glargine  15 Units Subcutaneous QHS   levothyroxine  12.5 mcg Oral QAC breakfast   lidocaine   Topical TID   metoprolol tartrate  25 mg Oral BID   midodrine  10 mg Oral TID WC   polyethylene glycol  17 g Oral Daily   pregabalin  25 mg Oral Daily   senna-docusate  1 tablet Oral BID   sevelamer carbonate  1,600 mg Oral TID WC   Continuous Infusions:  sodium thiosulfate infusion for calciphylaxis Stopped (12/17/20 1154)     LOS: 17 days   Time spent: 28 minutes   Darliss Cheney, MD [facility]  12/20/2020, 1:40 PM   How to contact the [facility] Attending or Consulting provider [place] or covering provider during after hours [place], for this patient?  Check the care team in [facility] and look for a) attending/consulting TRH provider listed and b) the [facility] team listed. Page or secure chat [phone]. Log into [email or web address] and use [place]'s universal password to access. If you do not have the password, please contact the hospital operator. Locate the [facility] provider you are looking for under [facility] and page to a number that you can be directly reached. If you still have difficulty reaching the provider, please page the [facility] (Director on Call) for the Hospitalists listed on  amion for assistance.

## 2020-12-20 NOTE — Progress Notes (Signed)
PT Cancellation Note  Patient Details Name: Kyle Walton MRN: [id number] DOB: September 18, 1955   Cancelled Treatment:    Reason Eval/Treat Not Completed: Patient at procedure or test/unavailable  Currently in HD;  Will follow up later today as time allows;  Otherwise, will follow up for PT tomorrow;   Thank you,  Roney Marion, PT  Acute Rehabilitation Services Pager [phone] Office [phone]    [place] 12/20/2020, 3:21 PM

## 2020-12-20 NOTE — TOC Progression Note (Addendum)
Transition of Care [facility]) - Progression Note    Patient Details  Name: Kyle Walton MRN: [id number] Date of Birth: 02/24/1956  [facility]) CM/SW Contact  Bartholomew Crews, RN Phone Number: [phone] 12/20/2020, 5:07 PM  Clinical Narrative:     [facility] unable to offer SNF bed to patient d/t patient needing to have HD seat at [facility] on MWF. [facility] has not [facility] on MWF. TOC to continue search for SNF bed.   Call to [facility] to verify pending authorization. Advised by [facility] representative that additional clinical information was needed by 9am.   PT and OT progress notes need to be updated and sent to [facility] along with original PT/OT evaluations done on 5/29. Patient last seen by PT/OT 6/9. Noted that PT unable to see patient today d/t dialysis schedule today.   Authorization request canceled and will reinitiate authorization once therapy notes completed.   TOC following.   Expected Discharge Plan: Skilled Nursing Facility Barriers to Discharge: Continued Medical Work up  Expected Discharge Plan and Services Expected Discharge Plan: [place]         Expected Discharge Date: 12/20/20                                     Social Determinants of Health (SDOH) Interventions    Readmission Risk Interventions No flowsheet data found.

## 2020-12-20 NOTE — Consult Note (Addendum)
Cardiology Consultation:   Patient ID: Kyle Walton MRN: [id number]; DOB: 30-Nov-1955  Admit date: 12/03/2020 Date of Consult: 12/20/2020  PCP:  Patient, No Pcp Per (Inactive)   [facility] Providers Cardiologist:  Dr. Radene Knee     Patient Profile:   Kyle Walton is a 65 y.o. male with a hx of ESRF on HD, DM, PVD (w a number of toe amputations), morbid obesity, CAD (CABG 2017), OSA w/BIPAP, AFib, ICM< ICD, legally blind, who is being seen 12/20/2020 for the evaluation of prolonged QT at the request of Dr. Doristine Bosworth.  History of Present Illness:   Mr. Kyle Walton was admitted to [facility] 12/03/20 with RLE pain and found with purulent cellulitis RLE/sepsis as well as HFpEF managed with HD/nephrology and "WCT". Started on abx, IVF Home cardiac meds continued  BC have been negative twice He has required narcan after pain meds, and neurology and surgery have seen him for ongoing RLE pain. Had a skin bx done of thigh rash, >>  subepidermal edema with diffuse neutrophilic infiltrate involving the dermis extending into the subcutis.  Diagnostic vasculitis is not seen.  PAS staining is negative for fungal hi-fi.  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 Possible diabetic amyotrophy or meralgia paresthetica  He has completed antibiotics, has been recommended to SNF  EP is asked to see the patient with concerns of prolonged QT  LABS (6/12) K+ 3.9 BUN/Creat 36/4.95 WBC 14.5 H/H 10/31 Plts 306  In review of care everywhere admission in march 2022, "prolonged QT" is noted on his problem list, "On admission he was found to have tachyarrhythmias with prolonged QTC he was given magnesium with with QTC improved but his heart rate remained elevated so his medications were adjusted, dose of metoprolol was increased to 50 mg twicedaily after which his heart rate was better controlled"  By CXR he has a MDT dual chamber ICD, I can find no other clinical data  Meds: potential QT prolonging agents Amiodarone (an  outpt med) Hydroxyzine (getting PRN, last 2 days ago) Zofran, ordered prn, not getting  He is in HD now, tolerating well, intermittently on vitals is hypotensive.  No CP, palpitations, cardiac awareness.  Past Medical History:  Diagnosis Date   Diabetes mellitus without complication (HCC)    Diastolic heart failure (HCC)    ESRD (end stage renal disease) (HCC)    MI (myocardial infarction) (HCC)    OSA (obstructive sleep apnea)    PAF (paroxysmal atrial fibrillation) ([place])    Renal disorder    on dialysis    Past Surgical History:  Procedure Laterality Date   CORONARY ARTERY BYPASS GRAFT  2017   LIMA LAD, SVG PDA, OM3   DIALYSIS FISTULA CREATION     TOE AMPUTATION Bilateral      Home Medications:  Prior to Admission medications   Medication Sig Start Date End Date Taking? Authorizing Provider  amiodarone (PACERONE) 200 MG tablet Take 200 mg by mouth daily. 10/13/20  Yes [provider]  amLODipine (NORVASC) 2.5 MG tablet Take 2.5 mg by mouth daily. 10/13/20  Yes [provider]  atorvastatin (LIPITOR) 40 MG tablet Take 40 mg by mouth daily. 09/23/20  Yes [provider]  calcium acetate (PHOSLO) 667 MG capsule Take 1,334 mg by mouth 3 (three) times daily with meals. 08/23/20  Yes [provider]  cholestyramine (QUESTRAN) 4 g packet Take 1 packet by mouth 2 (two) times daily. 11/29/20  Yes [provider]  clopidogrel (PLAVIX) 75 MG tablet Take 75 mg  by mouth daily. 10/13/20  Yes [provider]  ELIQUIS 2.5 MG TABS tablet Take 2.5 mg by mouth 2 (two) times daily. 10/13/20  Yes [provider]  ergocalciferol (VITAMIN D2) 1.25 MG (50000 UT) capsule Take 50,000 Units by mouth every 30 (thirty) days.   Yes [provider]  gabapentin (NEURONTIN) 100 MG capsule Take 100 mg by mouth in the morning and at bedtime. 08/23/20  Yes [provider]  hydrOXYzine (ATARAX/VISTARIL) 25 MG tablet Take 25 mg by mouth 2 (two)  times daily. 10/13/20  Yes [provider]  insulin lispro (HUMALOG) 100 UNIT/ML KwikPen Inject into the skin. Inject two Units to twelve Units into the skin 15 (fifteen) minutes before meals for 30 days. 180-200 = 2 units 201-250 = 5 units 251-300 = 7 units 301-400 = 12 units and call MD 08/23/20  Yes [provider]  isosorbide mononitrate (IMDUR) 30 MG 24 hr tablet Take 30 mg by mouth daily. 10/13/20  Yes [provider]  latanoprost (XALATAN) 0.005 % ophthalmic solution Place 1 drop into both eyes at bedtime. 08/23/20  Yes [provider]  levothyroxine (SYNTHROID) 25 MCG tablet Take 12.5 mcg by mouth daily before breakfast. 10/13/20  Yes [provider]  metoprolol tartrate (LOPRESSOR) 50 MG tablet Take 50 mg by mouth 2 (two) times daily. 10/13/20  Yes [provider]  mupirocin ointment (BACTROBAN) 2 % Apply 1 application topically as directed. Apply 2-3 times daily 11/16/20  Yes [provider]  pantoprazole (PROTONIX) 40 MG tablet Take 1 tablet by mouth daily. 10/13/20  Yes [provider]  sodium bicarbonate 650 MG tablet Take 325 mg by mouth 2 (two) times daily. 08/23/20  Yes [provider]  apixaban (ELIQUIS) 5 MG TABS tablet Take 1 tablet (5 mg total) by mouth 2 (two) times daily. 12/20/20 01/19/21  Darliss Cheney, MD  cholestyramine light (PREVALITE) 4 g packet Take 1 packet (4 g total) by mouth every 12 (twelve) hours. 12/20/20 01/19/21  Darliss Cheney, MD  Insulin Glargine (BASAGLAR KWIKPEN) 100 UNIT/ML Inject 15 Units into the skin at bedtime. 12/20/20   Darliss Cheney, MD  midodrine (PROAMATINE) 10 MG tablet Take 1 tablet (10 mg total) by mouth 3 (three) times daily with meals. 12/20/20 01/19/21  Darliss Cheney, MD  nitroGLYCERIN (NITROSTAT) 0.3 MG SL tablet Place 0.3 mg under the tongue every 5 (five) minutes as needed for chest pain. 08/23/20   [provider]  oxyCODONE (OXY IR/ROXICODONE) 5 MG immediate release tablet  Take 1 tablet (5 mg total) by mouth every 12 (twelve) hours as needed for severe pain. 12/20/20   Darliss Cheney, MD  pregabalin (LYRICA) 25 MG capsule Take 1 capsule (25 mg total) by mouth daily. 12/21/20   Darliss Cheney, MD    Inpatient Medications: Scheduled Meds:  heparin sodium (porcine)       acetaminophen  1,000 mg Oral Q8H   amiodarone  200 mg Oral Daily   apixaban  5 mg Oral BID   atorvastatin  40 mg Oral Daily   Chlorhexidine Gluconate Cloth  6 each Topical Q0600   Chlorhexidine Gluconate Cloth  6 each Topical Q0600   Chlorhexidine Gluconate Cloth  6 each Topical Q0600   cholestyramine light  4 g Oral Q12H   clopidogrel  75 mg Oral Daily   darbepoetin (ARANESP) injection - DIALYSIS  60 mcg Intravenous Q Wed-HD   docusate sodium  100 mg Oral BID   insulin aspart  0-15 Units Subcutaneous  TID WC   insulin glargine  15 Units Subcutaneous QHS   levothyroxine  12.5 mcg Oral QAC breakfast   lidocaine   Topical TID   metoprolol tartrate  25 mg Oral BID   midodrine  10 mg Oral TID WC   polyethylene glycol  17 g Oral Daily   pregabalin  25 mg Oral Daily   senna-docusate  1 tablet Oral BID   sevelamer carbonate  1,600 mg Oral TID WC   Continuous Infusions:  sodium thiosulfate infusion for calciphylaxis Stopped (12/17/20 1154)   PRN Meds: bisacodyl, naLOXone (NARCAN)  injection, nitroGLYCERIN, ondansetron (ZOFRAN) IV, oxyCODONE  Allergies:    Allergies  Allergen Reactions   Morphine Other (See Comments)    Per son, [name], pt becomes unresponsive/disoriented. Especially when given after HD tx   Liraglutide Diarrhea    Phenol Phenol     Social History:   Social History   Socioeconomic History   Marital status: Single    Spouse name: Not on file   Number of children: Not on file   Years of education: Not on file   Highest education level: Not on file  Occupational History   Not on file  Tobacco Use   Smoking status: Never   Smokeless tobacco: Never  Substance and  Sexual Activity   Alcohol use: Not Currently   Drug use: Not Currently   Sexual activity: Not on file  Other Topics Concern   Not on file  Social History Narrative   Not on file   Social Determinants of Health   Financial Resource Strain: Not on file  Food Insecurity: Not on file  Transportation Needs: Not on file  Physical Activity: Not on file  Stress: Not on file  Social Connections: Not on file  Intimate Partner Violence: Not on file    Family History:   History reviewed.   ROS:  Please see the history of present illness.  All other ROS reviewed and negative.     Physical Exam/Data:   Vitals:   12/19/20 1633 12/19/20 1959 12/20/20 0602 12/20/20 0931  BP: 116/66 105/72 96/68 112/67  Pulse: (!) 102 (!) 120 (!) 117 88  Resp: '20 19 19 20  '$ Temp: 98.4 F (36.9 C) (!) 97.3 F (36.3 C) 97.7 F (36.5 C) 98 F (36.7 C)  TempSrc: Oral Oral Oral Oral  SpO2: 94% 95% 97% 98%  Weight:      Height:        Intake/Output Summary (Last 24 hours) at 12/20/2020 1336 Last data filed at 12/20/2020 0900 Gross per 24 hour  Intake 960 ml  Output 0 ml  Net 960 ml   Last 3 Weights 12/17/2020 12/17/2020 12/15/2020  Weight (lbs) 330 lb 334 lb 288 lb 12.8 oz  Weight (kg) 149.687 kg 151.5 kg 131 kg     Body mass index is 47.35 kg/m.  General:  Well nourished, well developed, in no acute distress HEENT: normal Lymph: no adenopathy Neck: no JVD Endocrine:  No thryomegaly Vascular: No carotid bruits Cardiac:  RRR; tachycardic 1-2/6 SM Lungs:  CTA b/l, no wheezing, rhonchi or rales  Abd: soft, nontender  Ext: no edema Musculoskeletal R thigh erythematous very tender, L lower leg is bandaged  Neuro:  no focal abnormalities noted Psych:  Normal affect   EKG:  The EKG was personally reviewed and demonstrates:    12/03/20: WCT 120bpm, LBBB, QRS 117m 12/09/20 : WCT 120bpm, ICVD, QRS 134 12/18/20: wide complex rhythm, 106bpm, variable PR, suspect AFlutter  OLD 12/18/2019: is regular,  74bpm, IVCD, QRS 138m  Telemetry:  Telemetry was personally reviewed and demonstrates:   Reviewed with Dr. KCaryl Comes most c/w AFlutter, rates generally 100-120  Relevant CV Studies:  In review of care everywhere:  09/17/20: TTE SUMMARY There is mild to moderate global hypokinesis of the left ventricle. LV ejection fraction = 30-35%. Mild left ventricular hypertrophy There is trace aortic regurgitation. There is trace mitral regurgitation. Mild pulmonary hypertension. There is trace tricuspid regurgitation. There is no pericardial effusion. Difficulty to compare with the prior study due to different imagequality, Probably no significant change Image     09/16/20: TTE SUMMARY The left ventricular size is normal. Left ventricular systolic function is severely reduced. LV ejection fraction = 15-20%. Left ventricular filling pattern is prolonged relaxation. Abnormal (paradoxical) septal motion consistent with LBBB. Unable to fully assess LV regional wall motion The right ventricle is moderately dilated. The right ventricular systolic function is moderately reduced. Device lead in the right ventricle The left atrial volume is severely increased. There is aortic valve sclerosis. There is moderate mitral annular calcification. There is mild tricuspid regurgitation. Mild pulmonary hypertension. Estimated right ventricular systolic pressure is 48 mmHg. Dilated IVC consistent with elevated RA pressure. There is no pericardial effusion. Compared to study [id number] systolic function is severely reduced,consider cardiology consultation.    Laboratory Data:  High Sensitivity Troponin:  No results for input(s): TROPONINIHS in the last 720 hours.   Chemistry Recent Labs  Lab 12/16/20 0602 12/17/20 0731 12/19/20 0550  NA 137 137 139  K 3.7 3.8 3.9  CL 95* 97* 96*  CO2 '22 22 22  '$ GLUCOSE 198* 134* 136*  BUN 31* 41* 36*  CREATININE 3.95* 5.07* 4.95*  CALCIUM 9.0 9.2 9.1  GFRNONAA 16* 12* 12*  ANIONGAP  20* 18* 21*    Recent Labs  Lab 12/15/20 0836 12/17/20 0731  ALBUMIN 2.1* 2.4*   Hematology Recent Labs  Lab 12/16/20 0602 12/17/20 0731 12/19/20 0550  WBC 20.2* 20.7* 14.5*  RBC 3.52* 3.58* 3.52*  HGB 10.2* 10.4* 10.2*  HCT 31.7* 32.4* 31.6*  MCV 90.1 90.5 89.8  MCH 29.0 29.1 29.0  MCHC 32.2 32.1 32.3  RDW 17.2* 17.1* 17.2*  PLT 257 317 306   BNPNo results for input(s): BNP, PROBNP in the last 168 hours.  DDimer No results for input(s): DDIMER in the last 168 hours.   Radiology/Studies:  No results found.   Assessment and Plan:   QT looks OK given his IVCD 2. AFlutter, known hx of AFib     CHA2DS2Vasc is 4     Out pt and initially here was under anticoagulated, today is day 8 of a therapeutic dose   Dr. KCaryl Comeshas seen and examined the patient and discussed the case with nephrology Will change lopressor to succinate 12.'5mg'$  daily Add dig and increase his amio (do not suspect rhythm conversion, but improved rate) Will check device  Requiring midodrine  3. CAD     No anginal complaints 4. ICM     Last EF reported anywhere from 15-35%     Volume with HD    Risk Assessment/Risk Scores:  { For questions or updates, please contact [place] LakePlease consult [email or web address] for contact info under    Signed, RBaldwin Jamaica PA-C  12/20/2020 1:36 PM  Patient seen and examined.  Assessment and recommendations  Atrial flutter-2: 1 conduction persistent'  Coronary artery disease with prior bypass  End-stage renal disease on hemodialysis  Suppurative  cellulitis  Calciphylaxis  Atrial fibrillation  High Risk Medication Surveillance amiodarone  QT prolongation   Asked to see because of QT prolongation.  In the context of amiodarone therapy QT intervals are acceptable up to about 600 ms.  Moreover, he has a backup ICD for protection against life-threatening consequences.  More notably however, is that he is in persistent atrial flutter with 2:  1 conduction hence his heart rate in the 120.  Unfortunately, he was inadequately anticoagulated until about a week ago and so undertaking cardioversion without TEE would be associated with an increased risk.  Discussing this with Dr. Soyla Murphy, he does not feel like he has dialysis and so limited that needs to be undertaken urgently/emergently; this is important to because his blood pressure I think would make prolonged sedation for transesophageal echo difficult.  Hence, we will try medical therapy which is notoriously limited in ability to control rapid atrial flutter.  We will increase his amiodarone to 40 mg twice daily and then after further discussions with Dr. Burnett Sheng have added low-dose digoxin aware of digoxin amiodarone interactions.  Suppurative cellulitis also puts his ICD at risk for infection.  I have asked and renal we will undertake blood cultures.  We will follow along.  Thank you for the consultation

## 2020-12-20 NOTE — Progress Notes (Addendum)
[facility] Progress Note   Subjective: Seen in room, lying flat in bed. No C/Os. Awaiting insurance authorization to be DC'd to SNF. HD today on schedule.   Objective Vitals:   12/19/20 1633 12/19/20 1959 12/20/20 0602 12/20/20 0931  BP: 116/66 105/72 96/68 112/67  Pulse: (!) 102 (!) 120 (!) 117 88  Resp: '20 19 19 20  '$ Temp: 98.4 F (36.9 C) (!) 97.3 F (36.3 C) 97.7 F (36.5 C) 98 F (36.7 C)  TempSrc: Oral Oral Oral Oral  SpO2: 94% 95% 97% 98%  Weight:      Height:       Physical Exam General: Chronically ill appearing male in NAD Heart: S1,S2, RRR  Lungs: CTAB Abdomen: S, NT Extremities: trace bilateral LE edema with erythema BLE.  Dialysis Access: [facility] drsg intact  Additional Objective Labs: Basic Metabolic Panel: Recent Labs  Lab 12/15/20 0836 12/16/20 0602 12/16/20 0937 12/17/20 0731 12/19/20 0550  NA 135 137  --  137 139  K 4.1 3.7  --  3.8 3.9  CL 98 95*  --  97* 96*  CO2 24 22  --  22 22  GLUCOSE 241* 198*  --  134* 136*  BUN 49* 31*  --  41* 36*  CREATININE 5.10* 3.95*  --  5.07* 4.95*  CALCIUM 9.0 9.0  --  9.2 9.1  PHOS 4.4  --  3.2 4.5  --    Liver Function Tests: Recent Labs  Lab 12/15/20 0836 12/17/20 0731  ALBUMIN 2.1* 2.4*   No results for input(s): LIPASE, AMYLASE in the last 168 hours. CBC: Recent Labs  Lab 12/15/20 0836 12/16/20 0602 12/17/20 0731 12/19/20 0550  WBC 21.7* 20.2* 20.7* 14.5*  HGB 9.6* 10.2* 10.4* 10.2*  HCT 30.2* 31.7* 32.4* 31.6*  MCV 91.2 90.1 90.5 89.8  PLT 256 257 317 306   Blood Culture    Component Value Date/Time   SDES BLOOD RIGHT HAND 12/04/2020 0412   SPECREQUEST  12/04/2020 0412    BOTTLES DRAWN AEROBIC AND ANAEROBIC Blood Culture results may not be optimal due to an inadequate volume of blood received in culture bottles   CULT  12/04/2020 0412    NO GROWTH 5 DAYS Performed at [facility] [address], [place],  [address]    REPTSTATUS 12/09/2020 FINAL  12/04/2020 0412    Cardiac Enzymes: No results for input(s): CKTOTAL, CKMB, CKMBINDEX, TROPONINI in the last 168 hours. CBG: Recent Labs  Lab 12/19/20 0700 12/19/20 1130 12/19/20 1630 12/19/20 2209 12/20/20 0707  GLUCAP 123* 150* 152* 114* 117*   Iron Studies: No results for input(s): IRON, TIBC, TRANSFERRIN, FERRITIN in the last 72 hours. '@lablastinr3'$ @ Studies/Results: No results found. Medications:  sodium thiosulfate infusion for calciphylaxis Stopped (12/17/20 1154)    acetaminophen  1,000 mg Oral Q8H   amiodarone  200 mg Oral Daily   apixaban  5 mg Oral BID   atorvastatin  40 mg Oral Daily   Chlorhexidine Gluconate Cloth  6 each Topical Q0600   Chlorhexidine Gluconate Cloth  6 each Topical Q0600   Chlorhexidine Gluconate Cloth  6 each Topical Q0600   cholestyramine light  4 g Oral Q12H   clopidogrel  75 mg Oral Daily   darbepoetin (ARANESP) injection - DIALYSIS  60 mcg Intravenous Q Wed-HD   docusate sodium  100 mg Oral BID   insulin aspart  0-15 Units Subcutaneous TID WC   insulin glargine  15 Units Subcutaneous QHS   levothyroxine  12.5 mcg Oral QAC breakfast   lidocaine   Topical TID   metoprolol tartrate  25 mg Oral BID   midodrine  10 mg Oral TID WC   polyethylene glycol  17 g Oral Daily   pregabalin  25 mg Oral Daily   senna-docusate  1 tablet Oral BID   sevelamer carbonate  1,600 mg Oral TID WC     Assessment/ Plan: Right leg pain: MRI negative.  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  The calcium based binders and VDRA were dc'd and pt started on sodium thiosulfate tiw w/ HD IV.  The skin punch biopsy consistent with infection/cellulitis. We discussed with Dr. Alton Revere from [place] who also strongly believes that this is calciphylaxis.  Plan is to continue IV sodium thiosulfate and continue to avoid Ca/ vit D products and use 2.0 (low) Ca++ bath. Pain and tenderness are slowly improving. If resolving it can  takes months. For dc tomorrow to SNF facility per the pt. Right LE cellulitis - status post course of IV antibiotics, blood cultures neg ESRD: MWF HD. Next HD today.  On midodrine for intradialytic hypotension.   Atrial fib w/ RVR - metoprolol 12.5 bid and po amio 200 qd w/ improving heart rates down into the low 100's. BP's soft and midodrine was started here at 10 tid. Continue at dc.  Anemia: Hemoglobin at goal > 10.  Getting Darbe 60 ug weekly here, continue. Secondary hyperparathyroidism: Calcium and phosphorus level acceptable.  We will do low calcium bath and avoid VDR A or calcium based binders.  Continue Renvela. HTN/volume: Monitor blood pressure. LE edema R thigh> L is stable to improving, no resp issues. On midodrine for intradialytic hypotension. Bed scales are broken.  Anticoagulation - getting eliquis for afib  Hulen Mandler H. Sokha Craker NP-C 12/20/2020, 11:11 AM  [employer] [phone]

## 2020-12-21 DIAGNOSIS — I4892 Unspecified atrial flutter: Secondary | ICD-10-CM

## 2020-12-21 DIAGNOSIS — I255 Ischemic cardiomyopathy: Secondary | ICD-10-CM

## 2020-12-21 LAB — GLUCOSE, CAPILLARY
Glucose-Capillary: 166 mg/dL — ABNORMAL HIGH (ref 70–99)
Glucose-Capillary: 164 mg/dL — ABNORMAL HIGH (ref 70–99)
Glucose-Capillary: 161 mg/dL — ABNORMAL HIGH (ref 70–99)
Glucose-Capillary: 156 mg/dL — ABNORMAL HIGH (ref 70–99)

## 2020-12-21 MED ORDER — DIPHENHYDRAMINE HCL 25 MG PO CAPS
25.0000 mg | ORAL_CAPSULE | Freq: Once | ORAL | Status: AC | PRN
  Administered 2020-12-21: 25 mg via ORAL
  Filled 2020-12-21: qty 1

## 2020-12-21 NOTE — Care Management (Addendum)
Spoke w admission coordinator at [facility] [phone]) to discuss if they they had a bed available. She requested I call her back after 11:00. Patient will go to [facility] [address] for HD on MWF, needing to be there at 11:30 for 11:45 chair time per Terri Piedra, Renal CSW. Update, informed by NP that patient has new A flutter that will be treated before DC. EP following. Hold on DC for now.

## 2020-12-21 NOTE — Progress Notes (Signed)
Patient refused wound care and wearing of prevalon boots.educated patient

## 2020-12-21 NOTE — Progress Notes (Signed)
Occupational Therapy Treatment Patient Details Name: Kyle Walton MRN: [id number] DOB: Jul 28, 1955 Today's Date: 12/21/2020    History of present illness Pt is a 65 y.o. male who presented 5/27 with R leg cellulitis and sepsis. PMH: PAF, OSA, MI, ESRD on HD MWF, DM on insulin, s/p multiple toe amputations, morbid obesity, ESBL/VRE/MRSA and CAD.   OT comments  Pt progressing to OOB  mobility using stedy with modA +2 for sit to stands x2 and modA+2 for bed mobility. Pt with difficulty using L elbow to elevate trunk with large body habitus. Pain in RLE persists and is inhibiting progress. Pt denying need to sit in recliner and states that recliner makes RLE pain worse. Pt totalA for toilet hygiene in standing with stedy. Pt set-upA for grooming  in bed. Pt requires continued OT skilled services. OT following acutely.    Follow Up Recommendations  SNF;Supervision/Assistance - 24 hour    Equipment Recommendations  3 in 1 bedside commode;Tub/shower bench;Wheelchair (measurements OT);Wheelchair cushion (measurements OT);Hospital bed    Recommendations for Other Services      Precautions / Restrictions Precautions Precautions: Fall;Other (comment) Precaution Comments: contact precautions; lateral aspect of R thigh is very painful and sensitive to even light touch Restrictions Weight Bearing Restrictions: No Other Position/Activity Restrictions: h/o multiple toe amputations bilat       Mobility Bed Mobility Overal bed mobility: Needs Assistance Bed Mobility: Rolling;Sidelying to Sit Rolling: Supervision (and rail) Sidelying to sit: Mod assist;+2 for physical assistance   Sit to supine: Mod assist   General bed mobility comments: Use of bed rails to roll to L side and push up with modA+2 as bed had deflated and pt very fatigued with minimal exertion.    Transfers Overall transfer level: Needs assistance Equipment used: Ambulation equipment used Transfers: Sit to/from Stand Sit to  Stand: Mod assist;+2 physical assistance         General transfer comment: heavy cues for arm placement and able to pull from stand    Balance Overall balance assessment: Needs assistance Sitting-balance support: Bilateral upper extremity supported;Feet supported Sitting balance-Leahy Scale: Fair     Standing balance support: Bilateral upper extremity supported;During functional activity Standing balance-Leahy Scale: Poor Standing balance comment: requires stedy for task                           ADL either performed or assessed with clinical judgement   ADL Overall ADL's : Needs assistance/impaired Eating/Feeding: Set up;Bed level   Grooming: Set up;Bed level                   Toilet Transfer: Moderate assistance;+2 for physical assistance;+2 for safety/equipment;Cueing for safety;Cueing for sequencing Toilet Transfer Details (indicate cue type and reason): simulated with use of stedy Toileting- Clothing Manipulation and Hygiene: Total assistance;Sit to/from stand Toileting - Clothing Manipulation Details (indicate cue type and reason): sit to stand from stedy; aware of BM, but unable to assist d/t need to hold onto stedy with BUEs     Functional mobility during ADLs: Moderate assistance;+2 for physical assistance;+2 for safety/equipment;Cueing for safety;Cueing for sequencing (with stedy for sit to stands; pt adamantly refusing sitting upright in recliner d/t pain in RLE.) General ADL Comments: Pt appears motivated to perform OOB tasks, but self limits with known pain. Pt willing to participate and requires moderate encouragement to participate.     Vision   Vision Assessment?: No apparent visual deficits   Perception  Praxis      Cognition Arousal/Alertness: Awake/alert Behavior During Therapy: WFL for tasks assessed/performed;Anxious Overall Cognitive Status: Within Functional Limits for tasks assessed                                  General Comments: Pt participating and expressing his wants/need for discomfort in recliner due to RLE pain and rubbing/shearing on R side.        Exercises Other Exercises Other Exercises: trunk extension and shoulder retraction in standing x2 mins with stedy   Shoulder Instructions       General Comments Pt used to using hover round at home; requires ability to walk short distances as it does not fit everywhere. VSS on RA.    Pertinent Vitals/ Pain       Pain Assessment: Faces Faces Pain Scale: Hurts even more Pain Location: R lateral leg with movement, R thigh with touch Pain Descriptors / Indicators: Grimacing;Guarding;Sharp Pain Intervention(s): Monitored during session  Home Living                                          Prior Functioning/Environment              Frequency  Min 2X/week        Progress Toward Goals  OT Goals(current goals can now be found in the care plan section)  Progress towards OT goals: Progressing toward goals  Acute Rehab OT Goals Patient Stated Goal: Post-acute rehab OT Goal Formulation: With patient Time For Goal Achievement: 01/04/21 Potential to Achieve Goals: Good ADL Goals Pt Will Perform Grooming: with set-up;sitting Pt Will Transfer to Toilet: with mod assist;stand pivot transfer;bedside commode Additional ADL Goal #1: Pt will complete bed mobility with minA to decrease caregiver burden. Additional ADL Goal #2: Pt will sit EOB x3 mins with minA for support in prep for seated ADL Additional ADL Goal #4: Pt will increase to x5 mins of standing in order to perform OOB ADL task with use of least restrictive AD/DME.  Plan Discharge plan remains appropriate    Co-evaluation    PT/OT/SLP Co-Evaluation/Treatment: Yes Reason for Co-Treatment: Complexity of the patient's impairments (multi-system involvement);To address functional/ADL transfers;For patient/therapist safety PT goals addressed during session:  Mobility/safety with mobility OT goals addressed during session: ADL's and self-care;Strengthening/ROM      AM-PAC OT "6 Clicks" Daily Activity     Outcome Measure   Help from another person eating meals?: None Help from another person taking care of personal grooming?: A Little Help from another person toileting, which includes using toliet, bedpan, or urinal?: Total Help from another person bathing (including washing, rinsing, drying)?: A Lot Help from another person to put on and taking off regular upper body clothing?: A Little Help from another person to put on and taking off regular lower body clothing?: Total 6 Click Score: 14    End of Session Equipment Utilized During Treatment: Gait belt;Other (comment) (stedy)  OT Visit Diagnosis: Unsteadiness on feet (R26.81);Other abnormalities of gait and mobility (R26.89);Muscle weakness (generalized) (M62.81);Pain Pain - Right/Left: Right Pain - part of body: Leg   Activity Tolerance Patient tolerated treatment well   Patient Left in bed;with call bell/phone within reach   Nurse Communication Mobility status;Other (comment) (pt not willing to sit in recliner d/t pain)  Time: 1000-1028 OT Time Calculation (min): 28 min  Charges: OT General Charges $OT Visit: 1 Visit OT Treatments $Self Care/Home Management : 8-22 mins  Jefferey Pica, OTR/L Acute Rehabilitation Services Pager: [phone] Office: [phone]    Kyle Walton 12/21/2020, 11:47 AM

## 2020-12-21 NOTE — Progress Notes (Addendum)
[facility] Progress Note   Subjective: Seen in room, better spirits today. No C/Os. Has been working with PT. EP now following, still in Aflutter rate 118. Denies CP/SOB. HD tomorrow on schedule.    Objective Vitals:   12/20/20 2137 12/20/20 2146 12/21/20 0437 12/21/20 1042  BP:  (!) 109/94 123/63 112/70  Pulse:  (!) 102 (!) 110 (!) 103  Resp: '20 17 18 17  '$ Temp:  (!) 97.5 F (36.4 C) 98.4 F (36.9 C) 98 F (36.7 C)  TempSrc:   Oral   SpO2:  95% 94% 90%  Weight:      Height:       Physical Exam General: Chronically ill appearing male in NAD Heart: S1,S2, RRR  Lungs: CTAB Abdomen: S, NT Extremities: trace bilateral LE edema with erythema BLE.  Dialysis Access: [facility] drsg intact    Additional Objective Labs: Basic Metabolic Panel: Recent Labs  Lab 12/15/20 0836 12/16/20 0602 12/16/20 0937 12/17/20 0731 12/19/20 0550  NA 135 137  --  137 139  K 4.1 3.7  --  3.8 3.9  CL 98 95*  --  97* 96*  CO2 24 22  --  22 22  GLUCOSE 241* 198*  --  134* 136*  BUN 49* 31*  --  41* 36*  CREATININE 5.10* 3.95*  --  5.07* 4.95*  CALCIUM 9.0 9.0  --  9.2 9.1  PHOS 4.4  --  3.2 4.5  --    Liver Function Tests: Recent Labs  Lab 12/15/20 0836 12/17/20 0731  ALBUMIN 2.1* 2.4*   No results for input(s): LIPASE, AMYLASE in the last 168 hours. CBC: Recent Labs  Lab 12/15/20 0836 12/16/20 0602 12/17/20 0731 12/19/20 0550 12/20/20 1209  WBC 21.7* 20.2* 20.7* 14.5* 15.3*  NEUTROABS  --   --   --   --  12.5*  HGB 9.6* 10.2* 10.4* 10.2* 10.8*  HCT 30.2* 31.7* 32.4* 31.6* 33.9*  MCV 91.2 90.1 90.5 89.8 89.7  PLT 256 257 317 306 338   Blood Culture    Component Value Date/Time   SDES BLOOD RIGHT HAND 12/04/2020 0412   SPECREQUEST  12/04/2020 0412    BOTTLES DRAWN AEROBIC AND ANAEROBIC Blood Culture results may not be optimal due to an inadequate volume of blood received in culture bottles   CULT  12/04/2020 0412    NO GROWTH 5 DAYS Performed at [facility] [address] [place], [place] [address]    REPTSTATUS 12/09/2020 FINAL 12/04/2020 0412    Cardiac Enzymes: No results for input(s): CKTOTAL, CKMB, CKMBINDEX, TROPONINI in the last 168 hours. CBG: Recent Labs  Lab 12/20/20 0707 12/20/20 1129 12/20/20 1746 12/20/20 2059 12/21/20 0619  GLUCAP 117* 132* 103* 131* 166*   Iron Studies: No results for input(s): IRON, TIBC, TRANSFERRIN, FERRITIN in the last 72 hours. '@lablastinr3'$ @ Studies/Results: No results found. Medications:  sodium thiosulfate infusion for calciphylaxis Stopped (12/17/20 1154)    amiodarone  400 mg Oral BID   apixaban  5 mg Oral BID   atorvastatin  40 mg Oral Daily   Chlorhexidine Gluconate Cloth  6 each Topical Q0600   Chlorhexidine Gluconate Cloth  6 each Topical Q0600   Chlorhexidine Gluconate Cloth  6 each Topical Q0600   cholestyramine light  4 g Oral Q12H   clopidogrel  75 mg Oral Daily   darbepoetin (ARANESP) injection - DIALYSIS  60 mcg Intravenous Q Wed-HD   digoxin  0.25 mg Oral Q8H   Followed  by   Derrill Memo ON 12/22/2020] digoxin  0.0625 mg Oral QODAY   docusate sodium  100 mg Oral BID   insulin aspart  0-15 Units Subcutaneous TID WC   insulin glargine  15 Units Subcutaneous QHS   levothyroxine  12.5 mcg Oral QAC breakfast   lidocaine   Topical TID   metoprolol succinate  12.5 mg Oral Daily   midodrine  10 mg Oral TID WC   polyethylene glycol  17 g Oral Daily   pregabalin  25 mg Oral Daily   senna-docusate  1 tablet Oral BID   sevelamer carbonate  1,600 mg Oral TID WC    amiodarone Assessment/ Plan: Aflutter: Seen by EP. Increased , starting digoxin. Plan for future cardioversion. On Apixaban.  Right leg pain: MRI negative.  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  The calcium based binders and VDRA were dc'd and pt started on sodium thiosulfate tiw w/ HD IV.  The skin punch biopsy consistent with infection/cellulitis. We  discussed with Dr. 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  Plan is to continue IV sodium thiosulfate and continue to avoid Ca/ vit D products and use 2.0 (low) Ca++ bath. Pain and tenderness are slowly improving. If resolving it can takes months. For dc tomorrow to SNF facility per the pt. 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 Volume removal impeded by Aflutter. Attempting to use midodrine but minimal UF with HD 12/20/20.  Right LE cellulitis - status post course of IV antibiotics, blood cultures neg. EP concerned about possible seeding of AICD. Will order BC X 2.  ESRD: MWF HD. Next HD 12/22/20.  On midodrine for intradialytic hypotension.   Atrial fib w/ RVR - metoprolol 12.5 bid and po amio 200 qd w/ improving heart rates down into the low 100's. BP's soft and midodrine was started here at 10 tid. Continue at dc.  Anemia: Hemoglobin at goal > 10.  Getting Darbe 60 ug weekly here, continue. Secondary hyperparathyroidism: C Ca high, PO4 at goal   We will do low calcium bath and avoid VDR A or calcium based binders.  Continue Renvela. HTN/volume: Monitor blood pressure. LE edema R thigh> L is stable to improving, no resp issues. On midodrine for intradialytic hypotension. Bed scales are broken.  Anticoagulation - getting eliquis for afib    Karolynn Infantino H. Lesean Woolverton NP-C 12/21/2020, 11:02 AM  [employer] [phone]

## 2020-12-21 NOTE — Progress Notes (Signed)
Physical Therapy Treatment Patient Details Name: Kyle Walton MRN: [id number] DOB: 1956-06-02 Today's Date: 12/21/2020    History of Present Illness Pt is a 65 y.o. male who presented 5/27 with R leg cellulitis and sepsis. PMH: PAF, OSA, MI, ESRD on HD MWF, DM on insulin, s/p multiple toe amputations, morbid obesity, ESBL/VRE/MRSA and CAD.    PT Comments    Continuing work on functional mobility and activity tolerance;  Session focused on functional transfers and standing tolerance; Don participated well, and tells [name] he is motivated to stand and walk; Clearly stated he wants to get to post-acute rehab, work on getting stronger and more stable with transfers (specifically to his own hoveround), to be able to get home -- he also mentioned continuing therapies at home;   Today, [name] stood twice to [employer] (standing frame-type of equipment) with +2 moderate assist; Able to maintain standing for at least 30 seconds, including performing trunk extension and spinal twist stretches; Seemed pleased to stand; Making progress towards goals, and updated Acute PT goals;   Continue to recommend SNF for post-acute rehab to maximize independence and safety with mobility and ADLs for safe DC home.   Follow Up Recommendations  SNF     Equipment Recommendations  3in1 (PT);Wheelchair (measurements PT);Wheelchair cushion (measurements PT);Hospital bed;Other (comment) Metallurgist)    Recommendations for Other Services       Precautions / Restrictions Precautions Precautions: Fall;Other (comment) Precaution Comments: contact precautions; Also noteworthy that lateral aspect of R thigh is very painful and sensitive to even light touch Restrictions Weight Bearing Restrictions: No Other Position/Activity Restrictions: h/o multiple toe amputations bilat    Mobility  Bed Mobility Overal bed mobility: Needs Assistance Bed Mobility: Rolling;Sidelying to Sit Rolling: Supervision (and rail) Sidelying to sit:  Mod assist;+2 for physical assistance   Sit to supine: Mod assist   General bed mobility comments: Good use of bedrails for all aspects of bed mobility; initially able to push up to sit with mod assist of 1, but fatigued and laid his upper body back to the bed (HOB elevated); after a short rest of upper body, +2 mad to push to sit with support anteriorly and posteriorly    Transfers Overall transfer level: Needs assistance Equipment used: Ambulation equipment used Transfers: Sit to/from Stand Sit to Stand: Mod assist;+2 physical assistance         General transfer comment: Heavy cues for hand placement and mod assist of 2 to stand from elevated bed to stedy; careful attention to keep R thigh from grazing against R frame of Stedy; Able to pull to stnd from high Stedy seat with min assist  Ambulation/Gait             General Gait Details: Pt considered trying steps in place in [place] -- clearly nervous; Told us he intends to try march in place next session   Stairs             Wheelchair Mobility    Modified Rankin (Stroke Patients Only)       Balance     Sitting balance-Leahy Scale: Fair       Standing balance-Leahy Scale: Poor Standing balance comment: leans into device and cannot pull away from lift                            Cognition Arousal/Alertness: Awake/alert Behavior During Therapy: WFL for tasks assessed/performed;Anxious Overall Cognitive Status: Within Functional Limits for tasks assessed  General Comments: Participating, and seemed pleased to be able to stand; Tell us the recliner is not comfortable, and refuses to sit in it; I wonder if there is another chair that would be more comfortable -- Or if there would be a way to have his own hoveround delivered here      Exercises Other Exercises Other Exercises: Standing gentle trunk AROM including spinal twists, upper trunk extension  (while standing in [place])    General Comments General comments (skin integrity, edema, etc.): Took extra time at end of session to discuss home setup and equipment; typically uses his hoveround for mobility, ramped entrance, and has a stair lift; does not have a wheelchair-typ device upstairs, so must walk short household distances; tells me he is looking into a wheelchair for upstairs      Pertinent Vitals/Pain Pain Assessment: Faces Faces Pain Scale: Hurts even more Pain Location: R lateral leg with movement, R thigh with touch Pain Descriptors / Indicators: Grimacing;Guarding;Sharp Pain Intervention(s): Monitored during session;Repositioned    Home Living                      Prior Function            PT Goals (current goals can now be found in the care plan section) Acute Rehab PT Goals Patient Stated Goal: Plans on going to post-acute rehab to work on strength and stabiltiy to get home PT Goal Formulation: With patient Time For Goal Achievement: 01/04/21 Potential to Achieve Goals: Good Progress towards PT goals: Progressing toward goals;Goals met and updated - see care plan (A few goals met; updated goals)    Frequency    Min 2X/week      PT Plan Current plan remains appropriate    Co-evaluation PT/OT/SLP Co-Evaluation/Treatment: Yes Reason for Co-Treatment: For patient/therapist safety;To address functional/ADL transfers PT goals addressed during session: Mobility/safety with mobility        AM-PAC PT "6 Clicks" Mobility   Outcome Measure  Help needed turning from your back to your side while in a flat bed without using bedrails?: None Help needed moving from lying on your back to sitting on the side of a flat bed without using bedrails?: A Lot Help needed moving to and from a bed to a chair (including a wheelchair)?: A Lot Help needed standing up from a chair using your arms (e.g., wheelchair or bedside chair)?: A Lot Help needed to walk in  hospital room?: Total Help needed climbing 3-5 steps with a railing? : Total 6 Click Score: 12    End of Session Equipment Utilized During Treatment: Gait belt Activity Tolerance: Patient tolerated treatment well;Other (comment) (R thigh pain is a precautoin) Patient left: in bed;with call bell/phone within reach;with bed alarm set Nurse Communication: Mobility status;Need for lift equipment PT Visit Diagnosis: Muscle weakness (generalized) (M62.81);Difficulty in walking, not elsewhere classified (R26.2);Pain Pain - Right/Left: Right Pain - part of body: Leg     Time: 3329-5188 PT Time Calculation (min) (ACUTE ONLY): 32 min  Charges:  $Therapeutic Activity: 8-22 mins                     Roney Marion, [place] Pager [phone] Office [place] 12/21/2020, 11:12 AM

## 2020-12-21 NOTE — Progress Notes (Addendum)
Progress Note  Patient Name: Kyle Walton Date of Encounter: 12/21/2020  [facility] Cardiologist: Dr. Radene Knee (looks like all of his appts last year and so far this year have been cancelled or now-showed to)  Subjective   Doing OK, no CP, no palpitations  Inpatient Medications    Scheduled Meds:  amiodarone  400 mg Oral BID   apixaban  5 mg Oral BID   atorvastatin  40 mg Oral Daily   Chlorhexidine Gluconate Cloth  6 each Topical Q0600   Chlorhexidine Gluconate Cloth  6 each Topical Q0600   Chlorhexidine Gluconate Cloth  6 each Topical Q0600   cholestyramine light  4 g Oral Q12H   clopidogrel  75 mg Oral Daily   darbepoetin (ARANESP) injection - DIALYSIS  60 mcg Intravenous Q Wed-HD   digoxin  0.25 mg Oral Q8H   Followed by   Derrill Memo ON 12/22/2020] digoxin  0.0625 mg Oral QODAY   docusate sodium  100 mg Oral BID   insulin aspart  0-15 Units Subcutaneous TID WC   insulin glargine  15 Units Subcutaneous QHS   levothyroxine  12.5 mcg Oral QAC breakfast   lidocaine   Topical TID   metoprolol succinate  12.5 mg Oral Daily   midodrine  10 mg Oral TID WC   polyethylene glycol  17 g Oral Daily   pregabalin  25 mg Oral Daily   senna-docusate  1 tablet Oral BID   sevelamer carbonate  1,600 mg Oral TID WC   Continuous Infusions:  sodium thiosulfate infusion for calciphylaxis Stopped (12/17/20 1154)   PRN Meds: acetaminophen, bisacodyl, naLOXone (NARCAN)  injection, nitroGLYCERIN, ondansetron (ZOFRAN) IV, oxyCODONE   Vital Signs    Vitals:   12/20/20 1748 12/20/20 2137 12/20/20 2146 12/21/20 0437  BP: 111/74  (!) 109/94 123/63  Pulse:   (!) 102 (!) 110  Resp: '18 20 17 18  '$ Temp: 97.8 F (36.6 C)  (!) 97.5 F (36.4 C) 98.4 F (36.9 C)  TempSrc: Oral   Oral  SpO2: 93%  95% 94%  Weight:      Height:        Intake/Output Summary (Last 24 hours) at 12/21/2020 0843 Last data filed at 12/21/2020 0800 Gross per 24 hour  Intake 900 ml  Output 1700 ml  Net -800 ml   Last  3 Weights 12/20/2020 12/17/2020 12/17/2020  Weight (lbs) 330 lb 0.5 oz 330 lb 334 lb  Weight (kg) 149.7 kg 149.687 kg 151.5 kg      Telemetry    Remains in AFlutter, rates 155's-120 - Personally Reviewed  ECG    No new EKGs - Personally Reviewed  Physical Exam   Examined by Dr. Caryl Comes, largely unchanged from yesterday GEN: No acute distress.   Neck: No JVD Cardiac: RRR, tachycardic, no murmurs, rubs, or gallops.  Respiratory: diminished at the bases. GI: Soft, nontender, non-distended  MS: R thigh erythematous and tender, dressed L lower leg Neuro:  Nonfocal  Psych: Normal affect   Labs    High Sensitivity Troponin:  No results for input(s): TROPONINIHS in the last 720 hours.    Chemistry Recent Labs  Lab 12/15/20 0836 12/16/20 0602 12/17/20 0731 12/19/20 0550  NA 135 137 137 139  K 4.1 3.7 3.8 3.9  CL 98 95* 97* 96*  CO2 '24 22 22 22  '$ GLUCOSE 241* 198* 134* 136*  BUN 49* 31* 41* 36*  CREATININE 5.10* 3.95* 5.07* 4.95*  CALCIUM 9.0 9.0 9.2 9.1  ALBUMIN 2.1*  --  2.4*  --   GFRNONAA 12* 16* 12* 12*  ANIONGAP 13 20* 18* 21*     Hematology Recent Labs  Lab 12/17/20 0731 12/19/20 0550 12/20/20 1209  WBC 20.7* 14.5* 15.3*  RBC 3.58* 3.52* 3.78*  HGB 10.4* 10.2* 10.8*  HCT 32.4* 31.6* 33.9*  MCV 90.5 89.8 89.7  MCH 29.1 29.0 28.6  MCHC 32.1 32.3 31.9  RDW 17.1* 17.2* 17.2*  PLT 317 306 338    BNPNo results for input(s): BNP, PROBNP in the last 168 hours.   DDimer No results for input(s): DDIMER in the last 168 hours.   Radiology    No results found.  Cardiac Studies   In review of care everywhere:   09/17/20: TTE SUMMARY There is mild to moderate global hypokinesis of the left ventricle. LV ejection fraction = 30-35%. Mild left ventricular hypertrophy There is trace aortic regurgitation. There is trace mitral regurgitation. Mild pulmonary hypertension. There is trace tricuspid regurgitation. There is no pericardial effusion. Difficulty to compare  with the prior study due to different imagequality, Probably no significant change Image        09/16/20: TTE SUMMARY The left ventricular size is normal. Left ventricular systolic function is severely reduced. LV ejection fraction = 15-20%. Left ventricular filling pattern is prolonged relaxation. Abnormal (paradoxical) septal motion consistent with LBBB. Unable to fully assess LV regional wall motion The right ventricle is moderately dilated. The right ventricular systolic function is moderately reduced. Device lead in the right ventricle The left atrial volume is severely increased. There is aortic valve sclerosis. There is moderate mitral annular calcification. There is mild tricuspid regurgitation. Mild pulmonary hypertension. Estimated right ventricular systolic pressure is 48 mmHg. Dilated IVC consistent with elevated RA pressure. There is no pericardial effusion. Compared to study [id number] LV systolic function is severely reduced,consider cardiology consultation.   Patient Profile     65 y.o. 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 AFlutter, known hx of AFib     Rhythm confirmed by device interrogation yesterday (device function is intact, battery/lead measurements are good)     This episode started in May     CHA2DS2Vasc is 4     Out pt and initially here was under anticoagulated, today is day 9 of a therapeutic dose    remains tachycardic Dr. Caryl Comes has seen and examined the patient today Continue meds as ordered, follow HR response Requiring midodrine   3. CAD     No anginal complaints 4. ICM     Last EF  reported anywhere from 15-35%     Volume with HD  For questions or updates, please contact [place] Please consult [email or web address] for contact info under        Signed, Baldwin Jamaica, PA-C  12/21/2020, 8:43 AM   (As above)  Hopefully medications will slow HR  Otherwise will plan DCCV in 2 weeks BP low so difficult with Guideline directed medical therapy

## 2020-12-21 NOTE — Progress Notes (Signed)
PROGRESS NOTE    Kyle Walton  [id number] DOB: 08-06-1955 DOA: 12/03/2020 PCP: Patient, No Pcp Per (Inactive)   Brief Narrative:  Patient is a 65 y.o. male PAF, CAD s/p CABG, AICD implantation, PAD-s/p numerous toe amputations, HTN, ESRD on HD MWF-admitted for right leg soft tissue infection and right upper thigh pain.  See below for further details.  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   - Blood cultures are negative so far.   - Completed a course of vancomycin/cefepime.  See below.   Right lateral thigh pain with worsening erythema: imaging unremarkable for underlying abscess, DVT negative -Unclear etiology, biopsy as above shows diffuse neutrophilic infiltrate without clear etiology -ID consulted to further evaluate for possible antibiotic needs however given patient's clinical improvement and completion of previous antibiotics and only ongoing leukocytosis as sign of infection however leukocytosis has finally started to improve as well. Punch biopsy shows subepidermal edema with diffuse neutrophilic infiltrate involving the dermis extending into the subcutis.  Diagnostic vasculitis is not seen.  PAS staining is negative for fungal hi-fi.  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 -Placed on sodium thiosulfate with dialysis Monday Wednesday Friday per nephrology -Narcotics previously discontinued after multiple issues with an episode  of lethargy requiring Narcan last week however patient does have a real reason for pain and thus I started and will continue low-dose oxycodone 5 mg every 12 hours as needed.  He has been doing fine on this dose for the last 3 days with no issue with lethargy. -Gabapentin discontinued due to previous episodes of encephalopathy -Topical lidocaine a very little improvement, Lyrica ongoing   Lower extremity weakness/acute on chronic ambulatory dysfunction:  -Right greater than left - appears to be secondary to pain and not a mechanical/impingement issue -No further recommendations from neurology -PT/OT ongoing, ambulation issues appears to be secondary to pain rather than functional issue.  PT OT recommends SNF.  She has been medically stable for last 3 days waiting for bed placement.  I was informed yesterday that insurance authorization is pending for [facility] however they also have COVID outbreak and they do not have any male bed available until end of this week and patient does not have any other bed offer from either facility so he is not going to be discharged today.  TOC is making phone calls at other facilities.   Acute toxic encephalopathy, resolved: - 5/29 -patient was given 1 dose of Narcan.  Keep on low-dose of narcotics and continue to hold hydroxyzine.   ESRD on HD MWF: Nephrology following.   HTN: BP on the softer/lower side-continue midodrine and Toprol-XL.   Chronic diastolic heart failure: Volume status relatively stable-diuresis with HD.   CAD-s/p CABG 2017: No anginal symptoms-on Plavix/statin/beta-blocker   History of PAF/atrial flutter/prolonged QTC: Patient continues to have multiple different arrhythmias and QTC was prolonged.  His QTC prolonging medications were held which were Protonix as well as hydroxyzine.  Cardiology was consulted yesterday.  They increased his amiodarone to 400 mg p.o. twice daily, changed his metoprolol to Toprol-XL 12.5 mg p.o. daily and added digoxin.   Cardiology/EP is seeing this patient.  Still has elevated heart rate.  Might need cardioversion down the road.  Device has been checked.  Please see cardiology's note today.   History of AICD implantation, stable   History of PAD-s/p multiple toe amputations bilaterally, stable   DM-2, uncontrolled with hyperglycemia: Blood sugar fairly controlled, continue Lantus 15 units and SSI.   Hypothyroidism: Continue Synthroid   OSA: Continue CPAP nightly   Legally blind   Morbid Obesity: Estimated body mass index is 49.22 kg/m as calculated from the following:   Height as of this encounter: '5\' 10"'$  (1.778 m).   Weight as of this encounter: 155.6 kg.    DVT prophylaxis:    Code Status: Full Code  Family Communication: None present at bedside.  Plan of care discussed with patient in length and he verbalized understanding and agreed with it.  Discussed with his wife yesterday  Status is: Inpatient  Remains inpatient appropriate because:Unsafe d/c plan  Dispo: The patient is from: Home              Anticipated d/c is to: SNF,               Patient currently is medically stable to d/c.   Difficult to place patient No        Estimated body mass index is 47.35 kg/m as calculated from the following:   Height as of this encounter: '5\' 10"'$  (1.778 m).   Weight as of this encounter: 149.7 kg.  Pressure Injury 12/11/20 Sacrum Right;Left;Mid Stage 2 -  Partial thickness loss of dermis presenting as a shallow open injury with a red, pink wound bed without slough. (Active)  12/11/20 0551  Location: Sacrum  Location Orientation: Right;Left;Mid  Staging: Stage 2 -  Partial thickness loss of dermis presenting as a shallow open injury with a red, pink wound bed without slough.  Wound Description (Comments):   Present on Admission:      Nutritional status:               Consultants:  Nephrology ID  Procedures:  Punch biopsy  Antimicrobials:  Anti-infectives (From  admission, onward)    Start     Dose/Rate Route Frequency Ordered Stop   12/08/20 1007  vancomycin (VANCOCIN) 1-5 GM/200ML-% IVPB       Note to Pharmacy: Judieth Keens  : cabinet override      12/08/20 1007 12/08/20 1102   12/06/20 1203  vancomycin (VANCOCIN) 1-5 GM/200ML-% IVPB       Note to Pharmacy: Wallace Cullens   : cabinet override      12/06/20 1203 12/06/20 1206   12/04/20 1030  vancomycin (VANCOREADY) IVPB 1000 mg/200 mL        1,000 mg 200 mL/hr over 60 Minutes Intravenous  Once 12/04/20 0930 12/04/20 1230   12/03/20 2300  vancomycin (VANCOREADY) IVPB 1000 mg/200 mL        1,000 mg 200 mL/hr over 60 Minutes Intravenous Every M-W-F (Hemodialysis) 12/03/20 2019 12/09/20 2359   12/03/20 1830  ceFEPIme (MAXIPIME) 1 g in sodium chloride 0.9 % 100 mL IVPB  Status:  Discontinued        1 g 200 mL/hr over 30 Minutes Intravenous Every 24 hours 12/03/20 1802 12/05/20 1630   12/03/20 1800  vancomycin (VANCOREADY) IVPB 1000 mg/200 mL  Status:  Discontinued        1,000 mg 200 mL/hr over 60 Minutes Intravenous  Once 12/03/20 1756 12/03/20 1801   12/03/20 1300  vancomycin (VANCOREADY) IVPB 1750 mg/350 mL        1,750 mg 175 mL/hr over 120 Minutes Intravenous  Once 12/03/20 1252 12/03/20 1706   12/03/20 1300  cefTRIAXone (ROCEPHIN) 2 g in sodium chloride 0.9 % 100 mL IVPB        2 g 200 mL/hr over 30 Minutes Intravenous  Once 12/03/20 1252 12/03/20 1345          Subjective: Patient seen and examined.  He was intermittent sleepy up until yesterday, hydroxyzine was discontinued yesterday and he is fully alert and oriented today however he states that he has some itching and is requesting to place him back on hydroxyzine.  Long discussion with the patient and counseling provided and informed him the reason for discontinuing hydroxyzine.  He is willing to give it 1 more day to see how he feels as far as his itching goes.  He has no other complaint.  Objective: Vitals:   12/20/20  2137 12/20/20 2146 12/21/20 0437 12/21/20 1042  BP:  (!) 109/94 123/63 112/70  Pulse:  (!) 102 (!) 110 (!) 103  Resp: '20 17 18 17  '$ Temp:  (!) 97.5 F (36.4 C) 98.4 F (36.9 C) 98 F (36.7 C)  TempSrc:   Oral   SpO2:  95% 94% 90%  Weight:      Height:        Intake/Output Summary (Last 24 hours) at 12/21/2020 1127 Last data filed at 12/21/2020 0800 Gross per 24 hour  Intake 660 ml  Output 1700 ml  Net -1040 ml    Filed Weights   12/17/20 0800 12/17/20 1207 12/20/20 1315  Weight: (!) 151.5 kg (!) 149.7 kg (!) 149.7 kg    Examination:  General exam: Appears calm and comfortable  Respiratory system: Clear to auscultation. Respiratory effort normal. Cardiovascular system: S1 & S2 heard, RRR. No JVD, murmurs, rubs, gallops or clicks. No pedal edema. Gastrointestinal system: Abdomen is nondistended, soft and nontender. No organomegaly or masses felt. Normal bowel sounds heard. Central nervous system: Alert and oriented. No focal neurological deficits. Extremities: No slightly decreased strength in right lower extremity due to pain.  Skin: Hyperpigmentation and epidermal edema at right lateral thigh which is tender to palpation but not warm.  Has dressing in the right lower extremity. Psychiatry: Judgement and insight appear normal. Mood & affect appropriate.   Data Reviewed: I have personally reviewed following labs and imaging studies  CBC: Recent Labs  Lab 12/15/20 0836 12/16/20 0602 12/17/20 0731 12/19/20 0550 12/20/20 1209  WBC 21.7* 20.2* 20.7* 14.5* 15.3*  NEUTROABS  --   --   --   --  12.5*  HGB 9.6* 10.2* 10.4* 10.2* 10.8*  HCT 30.2* 31.7* 32.4* 31.6* 33.9*  MCV 91.2 90.1 90.5 89.8 89.7  PLT 256 257 317 306 [id number]    Basic Metabolic Panel: Recent Labs  Lab 12/15/20 0836 12/16/20 0602 12/16/20 0937 12/17/20 0731 12/19/20 0550 12/20/20 1209  NA 135 137  --  137 139  --   K 4.1 3.7  --  3.8 3.9  --   CL 98 95*  --  97* 96*  --   CO2 24 22  --  22 22  --    GLUCOSE 241* 198*  --  134* 136*  --   BUN 49* 31*  --  41* 36*  --   CREATININE 5.10* 3.95*  --  5.07* 4.95*  --   CALCIUM 9.0 9.0  --  9.2 9.1  --   MG  --   --   --   --   --  2.0  PHOS 4.4  --  3.2 4.5  --   --     GFR: Estimated Creatinine Clearance: 21.8 mL/min (A) (by C-G formula based on SCr of 4.95 mg/dL (H)). Liver Function Tests: Recent Labs  Lab 12/15/20 0836 12/17/20 0731  ALBUMIN 2.1* 2.4*    No results for input(s): LIPASE, AMYLASE in the last 168 hours. No results for input(s): AMMONIA in the last 168 hours. Coagulation Profile: No results for input(s): INR, PROTIME in the last 168 hours. Cardiac Enzymes: No results for input(s): CKTOTAL, CKMB, CKMBINDEX, TROPONINI in the last 168 hours. BNP (last 3 results) No results for input(s): PROBNP in the last 8760 hours. HbA1C: No results for input(s): HGBA1C in the last 72 hours. CBG: Recent Labs  Lab 12/20/20 0707 12/20/20 1129 12/20/20 1746 12/20/20 2059 12/21/20 0619  GLUCAP 117* 132* 103* 131* 166*    Lipid Profile: No results for input(s): CHOL, HDL, LDLCALC, TRIG, CHOLHDL, LDLDIRECT in the last 72 hours. Thyroid Function Tests: No results for input(s): TSH, T4TOTAL, FREET4, T3FREE, THYROIDAB in the last 72 hours. Anemia Panel: No results for input(s): VITAMINB12, FOLATE, FERRITIN, TIBC, IRON, RETICCTPCT in the last 72 hours. Sepsis Labs: No results for input(s): PROCALCITON, LATICACIDVEN in the last 168 hours.  Recent Results (from the past 240 hour(s))  MRSA PCR Screening     Status: None   Collection Time: 12/15/20  8:23 AM   Specimen: Nasopharyngeal  Result Value Ref Range Status   MRSA by PCR NEGATIVE NEGATIVE Final    Comment:        The GeneXpert MRSA Assay (FDA approved for NASAL specimens only), is one component of a comprehensive MRSA colonization surveillance program. It is not intended to diagnose MRSA infection nor to guide or monitor treatment for MRSA infections. Performed  at [facility], [place] [address] [place], [place] [address]   Resp Panel by RT-PCR (Flu A&B, Covid) Nasopharyngeal Swab     Status: None   Collection Time: 12/20/20  8:06 AM   Specimen: Nasopharyngeal Swab; Nasopharyngeal(NP) swabs in vial transport medium  Result Value Ref Range Status   SARS Coronavirus 2 by RT PCR NEGATIVE NEGATIVE Final    Comment: (NOTE) SARS-CoV-2 target nucleic acids are NOT DETECTED.  The SARS-CoV-2 RNA is generally detectable in upper respiratory specimens during the acute phase of infection. The lowest concentration of SARS-CoV-2 viral copies this assay can detect is 138 copies/mL. A negative result does not preclude SARS-Cov-2 infection and should not be used as the sole basis for treatment or other patient management decisions. A negative result may occur with  improper specimen collection/handling, submission of specimen other than nasopharyngeal swab, presence of viral mutation(s) within the areas targeted by this assay, and inadequate number of viral copies(<138 copies/mL). A negative result must be combined with clinical observations, patient history, and epidemiological information. The expected result is Negative.  Fact Sheet for Patients:  EntrepreneurPulse.com.au  Fact Sheet for Healthcare Providers:  IncredibleEmployment.be  This test is no t yet approved or cleared by the [place] FDA and  has been authorized for detection and/or diagnosis of SARS-CoV-2 by FDA under an Emergency Use Authorization (EUA). This EUA will remain  in effect (meaning this  test can be used) for the duration of the COVID-19 declaration under Section 564(b)(1) of the Act, 21 U.S.C.section 360bbb-3(b)(1), unless the authorization is terminated  or revoked sooner.       Influenza A by PCR NEGATIVE NEGATIVE Final   Influenza B by PCR NEGATIVE NEGATIVE Final    Comment: (NOTE) The Xpert Xpress SARS-CoV-2/FLU/RSV plus  assay is intended as an aid in the diagnosis of influenza from Nasopharyngeal swab specimens and should not be used as a sole basis for treatment. Nasal washings and aspirates are unacceptable for Xpert Xpress SARS-CoV-2/FLU/RSV testing.  Fact Sheet for Patients: EntrepreneurPulse.com.au  Fact Sheet for Healthcare Providers: IncredibleEmployment.be  This test is not yet approved or cleared by the [place] FDA and has been authorized for detection and/or diagnosis of SARS-CoV-2 by FDA under an Emergency Use Authorization (EUA). This EUA will remain in effect (meaning this test can be used) for the duration of the COVID-19 declaration under Section 564(b)(1) of the Act, 21 U.S.C. section 360bbb-3(b)(1), unless the authorization is terminated or revoked.  Performed at [facility], [place] [address] [place], [place] [address]        Radiology Studies: No results found.  Scheduled Meds:  amiodarone  400 mg Oral BID   apixaban  5 mg Oral BID   atorvastatin  40 mg Oral Daily   Chlorhexidine Gluconate Cloth  6 each Topical Q0600   Chlorhexidine Gluconate Cloth  6 each Topical Q0600   Chlorhexidine Gluconate Cloth  6 each Topical Q0600   cholestyramine light  4 g Oral Q12H   clopidogrel  75 mg Oral Daily   darbepoetin (ARANESP) injection - DIALYSIS  60 mcg Intravenous Q Wed-HD   digoxin  0.25 mg Oral Q8H   Followed by   [START ON 12/22/2020] digoxin  0.0625 mg Oral QODAY   docusate sodium  100 mg Oral BID   insulin aspart  0-15 Units Subcutaneous TID WC   insulin glargine  15 Units Subcutaneous QHS   levothyroxine  12.5 mcg Oral QAC breakfast   lidocaine   Topical TID   metoprolol succinate  12.5 mg Oral Daily   midodrine  10 mg Oral TID WC   polyethylene glycol  17 g Oral Daily   pregabalin  25 mg Oral Daily   senna-docusate  1 tablet Oral BID   sevelamer carbonate  1,600 mg Oral TID WC   Continuous Infusions:  sodium  thiosulfate infusion for calciphylaxis Stopped (12/17/20 1154)     LOS: 18 days   Time spent: 29 minutes   Darliss Cheney, MD [facility]  12/21/2020, 11:27 AM   How to contact the [facility] Attending or Consulting provider [employer] or covering provider during after hours [place], for this patient?  Check the care team in [facility] and look for a) attending/consulting [facility] provider listed and b) the [facility] team listed. Page or secure chat [phone]. Log into [email or web address] and use [place]'s universal password to access. If you do not have the password, please contact the hospital operator. Locate the [facility] provider you are looking for under [facility] and page to a number that you can be directly reached. If you still have difficulty reaching the provider, please page the [facility] (Director on Call) for the Hospitalists listed on amion for assistance.

## 2020-12-21 NOTE — Progress Notes (Signed)
Patient resting comfortably on CPAP via FFM, auto titrate settings with 3 lpm O2 .

## 2020-12-22 DIAGNOSIS — H548 Legal blindness, as defined in USA: Secondary | ICD-10-CM

## 2020-12-22 DIAGNOSIS — R5381 Other malaise: Secondary | ICD-10-CM

## 2020-12-22 DIAGNOSIS — I5032 Chronic diastolic (congestive) heart failure: Secondary | ICD-10-CM | POA: Diagnosis not present

## 2020-12-22 DIAGNOSIS — D72825 Bandemia: Secondary | ICD-10-CM

## 2020-12-22 DIAGNOSIS — R9431 Abnormal electrocardiogram [ECG] [EKG]: Secondary | ICD-10-CM

## 2020-12-22 DIAGNOSIS — E1151 Type 2 diabetes mellitus with diabetic peripheral angiopathy without gangrene: Secondary | ICD-10-CM | POA: Diagnosis not present

## 2020-12-22 DIAGNOSIS — N186 End stage renal disease: Secondary | ICD-10-CM | POA: Diagnosis not present

## 2020-12-22 DIAGNOSIS — L8992 Pressure ulcer of unspecified site, stage 2: Secondary | ICD-10-CM

## 2020-12-22 LAB — GLUCOSE, CAPILLARY
Glucose-Capillary: 130 mg/dL — ABNORMAL HIGH (ref 70–99)
Glucose-Capillary: 114 mg/dL — ABNORMAL HIGH (ref 70–99)
Glucose-Capillary: 123 mg/dL — ABNORMAL HIGH (ref 70–99)
Glucose-Capillary: 143 mg/dL — ABNORMAL HIGH (ref 70–99)

## 2020-12-22 LAB — CBC
HCT: 33.2 % — ABNORMAL LOW (ref 39.0–52.0)
Hemoglobin: 10.4 g/dL — ABNORMAL LOW (ref 13.0–17.0)
MCH: 28.4 pg (ref 26.0–34.0)
MCHC: 31.3 g/dL (ref 30.0–36.0)
MCV: 90.7 fL (ref 80.0–100.0)
Platelets: 305 10*3/uL (ref 150–400)
RBC: 3.66 MIL/uL — ABNORMAL LOW (ref 4.22–5.81)
RDW: 17.3 % — ABNORMAL HIGH (ref 11.5–15.5)
WBC: 18.2 10*3/uL — ABNORMAL HIGH (ref 4.0–10.5)
nRBC: 0 % (ref 0.0–0.2)

## 2020-12-22 LAB — BASIC METABOLIC PANEL
Anion gap: 21 — ABNORMAL HIGH (ref 5–15)
Anion gap: 18 — ABNORMAL HIGH (ref 5–15)
BUN: 36 mg/dL — ABNORMAL HIGH (ref 8–23)
BUN: 37 mg/dL — ABNORMAL HIGH (ref 8–23)
CO2: 22 mmol/L (ref 22–32)
CO2: 25 mmol/L (ref 22–32)
Calcium: 9.1 mg/dL (ref 8.9–10.3)
Calcium: 9.1 mg/dL (ref 8.9–10.3)
Chloride: 96 mmol/L — ABNORMAL LOW (ref 98–111)
Chloride: 96 mmol/L — ABNORMAL LOW (ref 98–111)
Creatinine, Ser: 4.95 mg/dL — ABNORMAL HIGH (ref 0.61–1.24)
Creatinine, Ser: 5.7 mg/dL — ABNORMAL HIGH (ref 0.61–1.24)
GFR, Estimated: 12 mL/min — ABNORMAL LOW (ref >60)
GFR, Estimated: 10 mL/min — ABNORMAL LOW (ref >60)
Glucose, Bld: 136 mg/dL — ABNORMAL HIGH (ref 70–99)
Glucose, Bld: 128 mg/dL — ABNORMAL HIGH (ref 70–99)
Potassium: 3.9 mmol/L (ref 3.5–5.1)
Potassium: 4 mmol/L (ref 3.5–5.1)
Sodium: 139 mmol/L (ref 135–145)
Sodium: 139 mmol/L (ref 135–145)

## 2020-12-22 MED ORDER — PROSOURCE PLUS PO LIQD
30.0000 mL | Freq: Two times a day (BID) | ORAL | Status: DC
  Administered 2020-12-22 – 2020-12-31 (×16): 30 mL via ORAL
  Filled 2020-12-22 (×16): qty 30

## 2020-12-22 MED ORDER — HEPARIN SODIUM (PORCINE) 1000 UNIT/ML IJ SOLN
INTRAMUSCULAR | Status: AC
  Administered 2020-12-22: 1000 [IU]
  Filled 2020-12-22: qty 4

## 2020-12-22 MED ORDER — DARBEPOETIN ALFA 60 MCG/0.3ML IJ SOSY
PREFILLED_SYRINGE | INTRAMUSCULAR | Status: AC
  Filled 2020-12-22: qty 0.3

## 2020-12-22 MED ORDER — MIDODRINE HCL 5 MG PO TABS
ORAL_TABLET | ORAL | Status: AC
  Filled 2020-12-22: qty 2

## 2020-12-22 MED ORDER — OXYCODONE HCL 5 MG PO TABS
ORAL_TABLET | ORAL | Status: AC
  Filled 2020-12-22: qty 1

## 2020-12-22 MED ORDER — DIPHENHYDRAMINE HCL 25 MG PO CAPS
ORAL_CAPSULE | ORAL | Status: AC
  Administered 2020-12-22: 25 mg
  Filled 2020-12-22: qty 1

## 2020-12-22 MED ORDER — RENA-VITE PO TABS
1.0000 | ORAL_TABLET | Freq: Every day | ORAL | Status: DC
  Administered 2020-12-22 – 2020-12-31 (×10): 1 via ORAL
  Filled 2020-12-22 (×10): qty 1

## 2020-12-22 NOTE — Progress Notes (Signed)
[facility] Progress Note   Subjective: Seen on HD, C/O leg pain AF on monitor rate 100s. BP stable so far on Midodrine. BC drawn 06/14 NGTD.    Objective Vitals:   12/22/20 0742 12/22/20 0800 12/22/20 0830 12/22/20 0900  BP: 100/60 112/60 102/60 (!) 101/53  Pulse: 93 100 (!) 102   Resp: 18 18    Temp: 98.1 F (36.7 C)     TempSrc: Oral     SpO2:      Weight:      Height:       Physical Exam General: Chronically ill appearing male in NAD Heart: S1,S2, RRR  Lungs: CTAB Abdomen: S, NT Extremities: trace bilateral LE edema with erythema BLE.  Dialysis Access: [facility] intact   Additional Objective Labs: Basic Metabolic Panel: Recent Labs  Lab 12/16/20 0937 12/17/20 0731 12/19/20 0550 12/22/20 0454  NA  --  137 139 139  K  --  3.8 3.9 4.0  CL  --  97* 96* 96*  CO2  --  '22 22 25  '$ GLUCOSE  --  134* 136* 128*  BUN  --  41* 36* 37*  CREATININE  --  5.07* 4.95* 5.70*  CALCIUM  --  9.2 9.1 9.1  PHOS 3.2 4.5  --   --    Liver Function Tests: Recent Labs  Lab 12/17/20 0731  ALBUMIN 2.4*   No results for input(s): LIPASE, AMYLASE in the last 168 hours. CBC: Recent Labs  Lab 12/16/20 0602 12/17/20 0731 12/19/20 0550 12/20/20 1209 12/22/20 0454  WBC 20.2* 20.7* 14.5* 15.3* 18.2*  NEUTROABS  --   --   --  12.5*  --   HGB 10.2* 10.4* 10.2* 10.8* 10.4*  HCT 31.7* 32.4* 31.6* 33.9* 33.2*  MCV 90.1 90.5 89.8 89.7 90.7  PLT 257 317 306 338 305   Blood Culture    Component Value Date/Time   SDES BLOOD LEFT HAND 12/21/2020 1223   SPECREQUEST  12/21/2020 1223    BOTTLES DRAWN AEROBIC AND ANAEROBIC Blood Culture adequate volume   CULT  12/21/2020 1223    NO GROWTH < 24 HOURS Performed at [facility], [place] [address], [place], [place] [address]    REPTSTATUS PENDING 12/21/2020 1223    Cardiac Enzymes: No results for input(s): CKTOTAL, CKMB, CKMBINDEX, TROPONINI in the last 168 hours. CBG: Recent Labs  Lab 12/21/20 0619 12/21/20 1126  12/21/20 1626 12/21/20 2036 12/22/20 0635  GLUCAP 166* 164* 161* 156* 130*   Iron Studies: No results for input(s): IRON, TIBC, TRANSFERRIN, FERRITIN in the last 72 hours. '@lablastinr3'$ @ Studies/Results: No results found. Medications:  sodium thiosulfate infusion for calciphylaxis Stopped (12/17/20 1154)    amiodarone  400 mg Oral BID   apixaban  5 mg Oral BID   atorvastatin  40 mg Oral Daily   Chlorhexidine Gluconate Cloth  6 each Topical Q0600   Chlorhexidine Gluconate Cloth  6 each Topical Q0600   Chlorhexidine Gluconate Cloth  6 each Topical Q0600   cholestyramine light  4 g Oral Q12H   clopidogrel  75 mg Oral Daily   Darbepoetin Alfa       darbepoetin (ARANESP) injection - DIALYSIS  60 mcg Intravenous Q Wed-HD   digoxin  0.0625 mg Oral QODAY   docusate sodium  100 mg Oral BID   heparin sodium (porcine)       insulin aspart  0-15 Units Subcutaneous TID WC   insulin glargine  15 Units Subcutaneous QHS  levothyroxine  12.5 mcg Oral QAC breakfast   lidocaine   Topical TID   metoprolol succinate  12.5 mg Oral Daily   midodrine  10 mg Oral TID WC   oxyCODONE       polyethylene glycol  17 g Oral Daily   pregabalin  25 mg Oral Daily   senna-docusate  1 tablet Oral BID   sevelamer carbonate  1,600 mg Oral TID WC     Assessment/ Plan: Aflutter: Seen by EP. Increased , starting digoxin. Plan for future cardioversion. On Apixaban.  Right leg pain: MRI negative.  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  The calcium based binders and VDRA were dc'd and pt started on sodium thiosulfate tiw w/ HD IV.  The skin punch biopsy consistent with infection/cellulitis. We discussed with Dr. 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  Plan is to continue IV sodium thiosulfate and continue to avoid Ca/ vit D products and use 2.0 (low) Ca++ bath. Pain and tenderness are slowly improving. If resolving it can takes  months. For dc tomorrow to SNF facility per the pt. 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 Volume removal impeded by Aflutter. Attempting to use midodrine but minimal UF with HD 12/20/20.  Right LE cellulitis - status post course of IV antibiotics, blood cultures neg. EP concerned about possible seeding of AICD.  BC X 2 12/21/20 NGTD.  ESRD: MWF HD. Next HD 12/22/20.     Anemia: Hemoglobin at goal > 10.  Getting Darbe 60 ug weekly here, continue. Volume: Volume removal has been tenuous D/T for intradialytic hypotension/Aflutter. Continue midodrine, UF as tolerated.  8. Nutrition: Albumin low. Add protein supps.  9. DM-per primary  Jimmye Norman. Keatyn Jawad NP-C 12/22/2020, 9:02 AM  [employer] [phone]

## 2020-12-22 NOTE — Plan of Care (Signed)
  Problem: Education: Goal: Knowledge of General Education information will improve Description Including pain rating scale, medication(s)/side effects and non-pharmacologic comfort measures Outcome: Progressing   Problem: Health Behavior/Discharge Planning: Goal: Ability to manage health-related needs will improve Outcome: Progressing   

## 2020-12-22 NOTE — Progress Notes (Signed)
PROGRESS NOTE  Kyle Walton [id number] DOB: [birth year]   PCP: Patient, No Pcp Per (Inactive)  Patient is from: Home.  Legally blind.  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  He has been started on sodium thiosulfate with HD.  Patient has bilateral lower extremity weakness, right greater than left.  Had extensive work-up including MRI C/T/L spine which did not reveal cord compression.  MRI pelvis showed diffuse edema, right greater than left.  Neurology recommended outpatient follow-up for EMG/nerve conduction study if no improvement in 3 weeks, and signed off.  Cardiology following for PAF/a flutter/prolonged QTc.  Amiodarone increased and digoxin added.  Plan for cardioversion down the road either inpatient or outpatient.   Therapy recommended SNF but better insurance authorization pending.  Subjective: Seen and examined earlier this afternoon after he returned from dialysis.  No major events overnight of this morning.  No complaints other than some cough and shortness of breath which are chronic for him.  He says his pain is "way better".  No other concerns or complaints.  Objective: Vitals:   12/22/20 1100 12/22/20 1130 12/22/20 1142 12/22/20 1322  BP: (!) 95/52 (!) 91/59 103/64 103/81  Pulse: 100 100 (!) 102 (!) 104  Resp: '16 16 16 18  '$ Temp:   98.8 F (37.1 C) 98.2 F (36.8 C)  TempSrc:    Oral Oral  SpO2: 100% 100% 100% 93%  Weight:   (!) 148 kg   Height:        Intake/Output Summary (Last 24 hours) at 12/22/2020 1418 Last data filed at 12/22/2020 1142 Gross per 24 hour  Intake 680 ml  Output 2000 ml  Net -1320 ml   Filed Weights   12/20/20 1315 12/22/20 0740 12/22/20 1142  Weight: (!) 149.7 kg (!) 150 kg (!) 148 kg    Examination:  GENERAL: No apparent distress.  Nontoxic. HEENT: MMM.  Vision and hearing grossly intact.  NECK: Supple.  No apparent JVD.  RESP: On RA.  No IWOB.  Fair aeration bilaterally. CVS:  RRR. Heart sounds normal.  ABD/GI/GU: BS+. Abd soft, NTND.  MSK/EXT:  Moves extremities.  Dressing over RLE.  Trace edema SKIN: Erythema and firmness over lateral aspect of right thigh.  Tender. NEURO: Awake, alert and oriented appropriately.  No apparent focal neuro deficit. PSYCH: Calm. Normal affect.   Procedures:  None  Microbiology summarized: 5/27 and 6/13-COVID-19 and influenza PCR nonreactive. 5/27 and 6/13-blood cultures NGTD. 6/8-MRSA PCR nonreactive.  Assessment & Plan: Paroxysmal A. fib/flutter/prolonged QT-QTc seems to be exaggerated by wide QRS.  CHA2DS2-VASc score > 4. -Cardiology following. -On amiodarone 400 mg twice daily, digoxin and metoprolol. -On Eliquis for anticoagulation. 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  LE [name] negative for DVT.  CT of right femur suggestive for soft tissue edema and skin thickening likely due to fluid overload versus cellulitis.  Skin  biopsy shows diffuse neutrophilic infiltrate without clear etiology.  PAS staining is negative for fungal hyphae.  With worsening erythema: imaging unremarkable for underlying abscess, DVT negative -Started on sodium thiosulfate with HD per nephrology. -Continue low-dose oxycodone and Lyrica cautiously   Sepsis due to RLE cellulitis-sepsis physiology resolved except for leukocytosis.   -Completed antibiotic course with vancomycin and cefepime.    Acute toxic encephalopathy, resolved: -5/29 received Narcan with improvement in his mentation. -Minimize opiates or other sedating medications   ESRD on HD MWF: Nephrology following. -Dialysis per nephrology   HTN: Normotensive but soft. -continue midodrine and Toprol-XL.   Chronic diastolic heart failure: Volume status relatively stable -Dialysis for fluid management per nephrology.   CAD-s/p CABG 2017: No anginal symptoms -Plavix/statin/beta-blocker     History of PAD-s/p multiple toe amputations bilaterally, stable -Continue Plavix and statin   Uncontrolled IDDM-2 with hyperglycemia, ESRD and PVD: Blood sugar fairly controlled,  Recent Labs  Lab 12/21/20 1126 12/21/20 1626 12/21/20 2036 12/22/20 0635 12/22/20 1308  GLUCAP 164* 161* 156* 130* 114*  -continue Lantus 15 units and SSI. -Continue statin.   Hypothyroidism:  -Check TSH. -Continue Synthroid   Lower extremity weakness/acute on chronic ambulatory dysfunction: right greater than left - appears to be secondary to pain and not a mechanical/impingement issue.  MRI C, T and L-spine without acute cord compression.  MRI pelvis with diffuse edema in the right pelvis more than left.  Neurology signed off.  Therapy recommended SNF.  Bed offer and insurance authorization pending. -Outpatient neurology follow-up for EMG/nerve conduction study if symptoms persist for > 3 weeks. OSA: Continue CPAP nightly   Legally blind  Leukocytosis: Somewhat persistent.  Not febrile.  Not on a steroid.  At this point, unclear source. -Continue monitoring   Morbid Obesity: Body mass index is 46.82 kg/m.       Pressure skin injury: Pressure Injury 12/11/20 Sacrum Right;Left;Mid Stage 2 -  Partial thickness loss of dermis presenting as a shallow open injury with a red, pink wound bed without slough. (Active)  12/11/20 0551  Location: Sacrum  Location Orientation:  Right;Left;Mid  Staging: Stage 2 -  Partial thickness loss of dermis presenting as a shallow open injury with a red, pink wound bed without slough.  Wound Description (Comments):   Present on Admission:    DVT prophylaxis:   apixaban (ELIQUIS) tablet 5 mg  Code Status: Full code Family Communication: Patient and/or RN. Available if any question.  Level of care: Telemetry Medical Status is: Inpatient  Remains inpatient appropriate because:Unsafe d/c plan  Dispo: The patient is from: Home              Anticipated d/c is to: SNF              Patient currently is not medically stable to d/c.   Difficult to place patient No       Consultants:  Cardiology Nephrology Neurology Infectious disease General surgery   Sch Meds:  Scheduled Meds:  (feeding supplement) PROSource Plus  30 mL Oral BID BM   amiodarone  400 mg Oral BID   apixaban  5 mg Oral BID   atorvastatin  40 mg Oral Daily   Chlorhexidine Gluconate Cloth  6 each Topical Q0600   Chlorhexidine Gluconate Cloth  6 each Topical Q0600   Chlorhexidine Gluconate Cloth  6 each Topical Q0600   cholestyramine light  4 g Oral Q12H   clopidogrel  75 mg Oral Daily   Darbepoetin Alfa  darbepoetin (ARANESP) injection - DIALYSIS  60 mcg Intravenous Q Wed-HD   digoxin  0.0625 mg Oral QODAY   docusate sodium  100 mg Oral BID   insulin aspart  0-15 Units Subcutaneous TID WC   insulin glargine  15 Units Subcutaneous QHS   levothyroxine  12.5 mcg Oral QAC breakfast   lidocaine   Topical TID   metoprolol succinate  12.5 mg Oral Daily   midodrine  10 mg Oral TID WC   multivitamin  1 tablet Oral QHS   polyethylene glycol  17 g Oral Daily   pregabalin  25 mg Oral Daily   senna-docusate  1 tablet Oral BID   sevelamer carbonate  1,600 mg Oral TID WC   Continuous Infusions:  sodium thiosulfate infusion for calciphylaxis 25 g (12/22/20 1100)   PRN Meds:.acetaminophen, bisacodyl, naLOXone (NARCAN)  injection, nitroGLYCERIN,  ondansetron (ZOFRAN) IV, oxyCODONE  Antimicrobials: Anti-infectives (From admission, onward)    Start     Dose/Rate Route Frequency Ordered Stop   12/08/20 1007  vancomycin (VANCOCIN) 1-5 GM/200ML-% IVPB       Note to Pharmacy: Judieth Keens  : cabinet override      12/08/20 1007 12/08/20 1102   12/06/20 1203  vancomycin (VANCOCIN) 1-5 GM/200ML-% IVPB       Note to Pharmacy: Wallace Cullens   : cabinet override      12/06/20 1203 12/06/20 1206   12/04/20 1030  vancomycin (VANCOREADY) IVPB 1000 mg/200 mL        1,000 mg 200 mL/hr over 60 Minutes Intravenous  Once 12/04/20 0930 12/04/20 1230   12/03/20 2300  vancomycin (VANCOREADY) IVPB 1000 mg/200 mL        1,000 mg 200 mL/hr over 60 Minutes Intravenous Every M-W-F (Hemodialysis) 12/03/20 2019 12/09/20 2359   12/03/20 1830  ceFEPIme (MAXIPIME) 1 g in sodium chloride 0.9 % 100 mL IVPB  Status:  Discontinued        1 g 200 mL/hr over 30 Minutes Intravenous Every 24 hours 12/03/20 1802 12/05/20 1630   12/03/20 1800  vancomycin (VANCOREADY) IVPB 1000 mg/200 mL  Status:  Discontinued        1,000 mg 200 mL/hr over 60 Minutes Intravenous  Once 12/03/20 1756 12/03/20 1801   12/03/20 1300  vancomycin (VANCOREADY) IVPB 1750 mg/350 mL        1,750 mg 175 mL/hr over 120 Minutes Intravenous  Once 12/03/20 1252 12/03/20 1706   12/03/20 1300  cefTRIAXone (ROCEPHIN) 2 g in sodium chloride 0.9 % 100 mL IVPB        2 g 200 mL/hr over 30 Minutes Intravenous  Once 12/03/20 1252 12/03/20 1345        I have personally reviewed the following labs and images: CBC: Recent Labs  Lab 12/16/20 0602 12/17/20 0731 12/19/20 0550 12/20/20 1209 12/22/20 0454  WBC 20.2* 20.7* 14.5* 15.3* 18.2*  NEUTROABS  --   --   --  12.5*  --   HGB 10.2* 10.4* 10.2* 10.8* 10.4*  HCT 31.7* 32.4* 31.6* 33.9* 33.2*  MCV 90.1 90.5 89.8 89.7 90.7  PLT 257 317 306 338 305   BMP &GFR Recent Labs  Lab 12/16/20 0602 12/16/20 0937 12/17/20 0731 12/19/20 0550  12/20/20 1209 12/22/20 0454  NA 137  --  137 139  --  139  K 3.7  --  3.8 3.9  --  4.0  CL 95*  --  97* 96*  --  96*  CO2 22  --  22  22  --  25  GLUCOSE 198*  --  134* 136*  --  128*  BUN 31*  --  41* 36*  --  37*  CREATININE 3.95*  --  5.07* 4.95*  --  5.70*  CALCIUM 9.0  --  9.2 9.1  --  9.1  MG  --   --   --   --  2.0  --   PHOS  --  3.2 4.5  --   --   --    Estimated Creatinine Clearance: 18.8 mL/min (A) (by C-G formula based on SCr of 5.7 mg/dL (H)). Liver & Pancreas: Recent Labs  Lab 12/17/20 0731  ALBUMIN 2.4*   No results for input(s): LIPASE, AMYLASE in the last 168 hours. No results for input(s): AMMONIA in the last 168 hours. Diabetic: No results for input(s): HGBA1C in the last 72 hours. Recent Labs  Lab 12/21/20 1126 12/21/20 1626 12/21/20 2036 12/22/20 0635 12/22/20 1308  GLUCAP 164* 161* 156* 130* 114*   Cardiac Enzymes: No results for input(s): CKTOTAL, CKMB, CKMBINDEX, TROPONINI in the last 168 hours. No results for input(s): PROBNP in the last 8760 hours. Coagulation Profile: No results for input(s): INR, PROTIME in the last 168 hours. Thyroid Function Tests: No results for input(s): TSH, T4TOTAL, FREET4, T3FREE, THYROIDAB in the last 72 hours. Lipid Profile: No results for input(s): CHOL, HDL, LDLCALC, TRIG, CHOLHDL, LDLDIRECT in the last 72 hours. Anemia Panel: No results for input(s): VITAMINB12, FOLATE, FERRITIN, TIBC, IRON, RETICCTPCT in the last 72 hours. Urine analysis: No results found for: COLORURINE, APPEARANCEUR, LABSPEC, PHURINE, GLUCOSEU, HGBUR, BILIRUBINUR, KETONESUR, PROTEINUR, UROBILINOGEN, NITRITE, LEUKOCYTESUR Sepsis Labs: Invalid input(s): PROCALCITONIN, [place]  Microbiology: Recent Results (from the past 240 hour(s))  MRSA PCR Screening     Status: None   Collection Time: 12/15/20  8:23 AM   Specimen: Nasopharyngeal  Result Value Ref Range Status   MRSA by PCR NEGATIVE NEGATIVE Final    Comment:        The GeneXpert  MRSA Assay (FDA approved for NASAL specimens only), is one component of a comprehensive MRSA colonization surveillance program. It is not intended to diagnose MRSA infection nor to guide or monitor treatment for MRSA infections. Performed at [facility], [place] [address], [place], [place] [address]   Resp Panel by RT-PCR (Flu A&B, Covid) Nasopharyngeal Swab     Status: None   Collection Time: 12/20/20  8:06 AM   Specimen: Nasopharyngeal Swab; Nasopharyngeal(NP) swabs in vial transport medium  Result Value Ref Range Status   SARS Coronavirus 2 by RT PCR NEGATIVE NEGATIVE Final    Comment: (NOTE) SARS-CoV-2 target nucleic acids are NOT DETECTED.  The SARS-CoV-2 RNA is generally detectable in upper respiratory specimens during the acute phase of infection. The lowest concentration of SARS-CoV-2 viral copies this assay can detect is 138 copies/mL. A negative result does not preclude SARS-Cov-2 infection and should not be used as the sole basis for treatment or other patient management decisions. A negative result may occur with  improper specimen collection/handling, submission of specimen other than nasopharyngeal swab, presence of viral mutation(s) within the areas targeted by this assay, and inadequate number of viral copies(<138 copies/mL). A negative result must be combined with clinical observations, patient history, and epidemiological information. The expected result is Negative.  Fact Sheet for Patients:  EntrepreneurPulse.com.au  Fact Sheet for Healthcare Providers:  IncredibleEmployment.be  This test is no t yet approved or cleared by the [place] and  has been authorized for detection and/or diagnosis of SARS-CoV-2 by FDA under an Emergency Use Authorization (EUA). This EUA will remain  in effect (meaning this test can be used) for the duration of the COVID-19 declaration under Section 564(b)(1) of the Act,  21 U.S.C.section 360bbb-3(b)(1), unless the authorization is terminated  or revoked sooner.       Influenza A by PCR NEGATIVE NEGATIVE Final   Influenza B by PCR NEGATIVE NEGATIVE Final    Comment: (NOTE) The Xpert Xpress SARS-CoV-2/FLU/RSV plus assay is intended as an aid in the diagnosis of influenza from Nasopharyngeal swab specimens and should not be used as a sole basis for treatment. Nasal washings and aspirates are unacceptable for Xpert Xpress SARS-CoV-2/FLU/RSV testing.  Fact Sheet for Patients: EntrepreneurPulse.com.au  Fact Sheet for Healthcare Providers: IncredibleEmployment.be  This test is not yet approved or cleared by the [place] FDA and has been authorized for detection and/or diagnosis of SARS-CoV-2 by FDA under an Emergency Use Authorization (EUA). This EUA will remain in effect (meaning this test can be used) for the duration of the COVID-19 declaration under Section 564(b)(1) of the Act, 21 U.S.C. section 360bbb-3(b)(1), unless the authorization is terminated or revoked.  Performed at [facility], [place] [address] [place], [place] [address]   Culture, blood (routine x 2)     Status: None (Preliminary result)   Collection Time: 12/21/20 12:18 PM   Specimen: BLOOD  Result Value Ref Range Status   Specimen Description BLOOD RIGHT ANTECUBITAL  Final   Special Requests   Final    BOTTLES DRAWN AEROBIC AND ANAEROBIC Blood Culture adequate volume   Culture   Final    NO GROWTH < 24 HOURS Performed at [facility], [place] [address] [place], [place] [address]    Report Status PENDING  Incomplete  Culture, blood (routine x 2)     Status: None (Preliminary result)   Collection Time: 12/21/20 12:23 PM   Specimen: BLOOD LEFT HAND  Result Value Ref Range Status   Specimen Description BLOOD LEFT HAND  Final   Special Requests   Final    BOTTLES DRAWN AEROBIC AND ANAEROBIC Blood Culture adequate volume    Culture   Final    NO GROWTH < 24 HOURS Performed at [facility], [place] [address] [place], [place] [address]    Report Status PENDING  Incomplete    Radiology Studies: No results found.    Rhilyn Battle T. [place]  If 7PM-7AM, please contact night-coverage [email or web address] 12/22/2020, 2:18 PM

## 2020-12-22 NOTE — Progress Notes (Signed)
Progress Note  Patient Name: Kyle Walton Date of Encounter: 12/22/2020  [facility] Cardiologist: Dr. Radene Knee (looks like all of his appts last year and so far this year have been cancelled or now-showed to)  Subjective   Sleepy without chest pain or sob  Inpatient Medications    Scheduled Meds:  (feeding supplement) PROSource Plus  30 mL Oral BID BM   amiodarone  400 mg Oral BID   apixaban  5 mg Oral BID   atorvastatin  40 mg Oral Daily   Chlorhexidine Gluconate Cloth  6 each Topical Q0600   Chlorhexidine Gluconate Cloth  6 each Topical Q0600   Chlorhexidine Gluconate Cloth  6 each Topical Q0600   cholestyramine light  4 g Oral Q12H   clopidogrel  75 mg Oral Daily   Darbepoetin Alfa       darbepoetin (ARANESP) injection - DIALYSIS  60 mcg Intravenous Q Wed-HD   digoxin  0.0625 mg Oral QODAY   docusate sodium  100 mg Oral BID   heparin sodium (porcine)       insulin aspart  0-15 Units Subcutaneous TID WC   insulin glargine  15 Units Subcutaneous QHS   levothyroxine  12.5 mcg Oral QAC breakfast   lidocaine   Topical TID   metoprolol succinate  12.5 mg Oral Daily   midodrine  10 mg Oral TID WC   multivitamin  1 tablet Oral QHS   polyethylene glycol  17 g Oral Daily   pregabalin  25 mg Oral Daily   senna-docusate  1 tablet Oral BID   sevelamer carbonate  1,600 mg Oral TID WC   Continuous Infusions:  sodium thiosulfate infusion for calciphylaxis Stopped (12/17/20 1154)   PRN Meds: acetaminophen, bisacodyl, naLOXone (NARCAN)  injection, nitroGLYCERIN, ondansetron (ZOFRAN) IV, oxyCODONE   Vital Signs    Vitals:   12/22/20 0900 12/22/20 0930 12/22/20 1000 12/22/20 1030  BP: (!) 101/53 (!) 101/46 (!) 96/54 (!) 95/51  Pulse: 96 95 96   Resp: '16 16 15   '$ Temp:      TempSrc:      SpO2: 100% 100% 99%   Weight:      Height:        Intake/Output Summary (Last 24 hours) at 12/22/2020 1050 Last data filed at 12/22/2020 0800 Gross per 24 hour  Intake 980 ml  Output 0  ml  Net 980 ml    Last 3 Weights 12/22/2020 12/20/2020 12/17/2020  Weight (lbs) 330 lb 11 oz 330 lb 0.5 oz 330 lb  Weight (kg) 150 kg 149.7 kg 149.687 kg      Telemetry    Remains in flutter at 2:1 but Flutter rate and ventricular rate slowed NOW HR aobut 100 bpm- Personally Reviewed  ECG    No new EKGs - Personally Reviewed  Physical Exam   Well developed and nourished in no acute distress HENT normal Rapid but regular rate and rhythm, no murmurs or gallops No Clubbing cyanosis edema Skin-warm and dry A & Oriented  Grossly normal sensory and motor function  ECG     Labs    High Sensitivity Troponin:  No results for input(s): TROPONINIHS in the last 720 hours.    Chemistry Recent Labs  Lab 12/17/20 0731 12/19/20 0550 12/22/20 0454  NA 137 139 139  K 3.8 3.9 4.0  CL 97* 96* 96*  CO2 '22 22 25  '$ GLUCOSE 134* 136* 128*  BUN 41* 36* 37*  CREATININE 5.07* 4.95* 5.70*  CALCIUM 9.2  9.1 9.1  ALBUMIN 2.4*  --   --   GFRNONAA 12* 12* 10*  ANIONGAP 18* 21* 18*      Hematology Recent Labs  Lab 12/19/20 0550 12/20/20 1209 12/22/20 0454  WBC 14.5* 15.3* 18.2*  RBC 3.52* 3.78* 3.66*  HGB 10.2* 10.8* 10.4*  HCT 31.6* 33.9* 33.2*  MCV 89.8 89.7 90.7  MCH 29.0 28.6 28.4  MCHC 32.3 31.9 31.3  RDW 17.2* 17.2* 17.3*  PLT 306 338 305     BNPNo results for input(s): BNP, PROBNP in the last 168 hours.   DDimer No results for input(s): DDIMER in the last 168 hours.   Radiology    No results found.  Cardiac Studies   In review of care everywhere:   09/17/20: TTE SUMMARY There is mild to moderate global hypokinesis of the left ventricle. LV ejection fraction = 30-35%. Mild left ventricular hypertrophy There is trace aortic regurgitation. There is trace mitral regurgitation. Mild pulmonary hypertension. There is trace tricuspid regurgitation. There is no pericardial effusion. Difficulty to compare with the prior study due to different imagequality, Probably no  significant change Image        09/16/20: TTE SUMMARY The left ventricular size is normal. Left ventricular systolic function is severely reduced. LV ejection fraction = 15-20%. Left ventricular filling pattern is prolonged relaxation. Abnormal (paradoxical) septal motion consistent with LBBB. Unable to fully assess LV regional wall motion The right ventricle is moderately dilated. The right ventricular systolic function is moderately reduced. Device lead in the right ventricle The left atrial volume is severely increased. There is aortic valve sclerosis. There is moderate mitral annular calcification. There is mild tricuspid regurgitation. Mild pulmonary hypertension. Estimated right ventricular systolic pressure is 48 mmHg. Dilated IVC consistent with elevated RA pressure. There is no pericardial effusion. Compared to study [id number] LV systolic function is severely reduced,consider cardiology consultation.   Patient Profile     65 y.o. 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  EP called yesterday with concerns of prolonged QT Pending SNF, likely Friday by discussion with nephrology yesterday  Assessment & Plan    Atrial flutter  RVR  Ischemic Cardiomyopathy  ICD  [employer]  ESRD on HD  Orthostatic hypotension  Supperative cellulitis  BC NTD 6/15   Calciphylaxis    Continue amio and dig Plan DCCV in about 2 weeks either to be done here ( back from rehab/NH) or can be done with primary service at [facility]  BP limiting Guideline directed medical therapy t  BC NTD -- thisis very good news    Signed, Virl Axe, MD  12/22/2020, 10:50 AM   (As above)  Hopefully medications will slow HR  Otherwise will plan DCCV in 2 weeks BP low so difficult with Guideline directed medical  therapy

## 2020-12-23 DIAGNOSIS — L03115 Cellulitis of right lower limb: Secondary | ICD-10-CM | POA: Diagnosis not present

## 2020-12-23 DIAGNOSIS — E1151 Type 2 diabetes mellitus with diabetic peripheral angiopathy without gangrene: Secondary | ICD-10-CM | POA: Diagnosis not present

## 2020-12-23 DIAGNOSIS — N186 End stage renal disease: Secondary | ICD-10-CM | POA: Diagnosis not present

## 2020-12-23 DIAGNOSIS — I5032 Chronic diastolic (congestive) heart failure: Secondary | ICD-10-CM | POA: Diagnosis not present

## 2020-12-23 LAB — CBC WITH DIFFERENTIAL/PLATELET
Abs Immature Granulocytes: 0.08 10*3/uL — ABNORMAL HIGH (ref 0.00–0.07)
Basophils Absolute: 0.1 10*3/uL (ref 0.0–0.1)
Basophils Relative: 1 %
Eosinophils Absolute: 0.2 10*3/uL (ref 0.0–0.5)
Eosinophils Relative: 1 %
HCT: 33 % — ABNORMAL LOW (ref 39.0–52.0)
Hemoglobin: 10 g/dL — ABNORMAL LOW (ref 13.0–17.0)
Immature Granulocytes: 1 %
Lymphocytes Relative: 6 %
Lymphs Abs: 0.9 10*3/uL (ref 0.7–4.0)
MCH: 28.1 pg (ref 26.0–34.0)
MCHC: 30.3 g/dL (ref 30.0–36.0)
MCV: 92.7 fL (ref 80.0–100.0)
Monocytes Absolute: 1.3 10*3/uL — ABNORMAL HIGH (ref 0.1–1.0)
Monocytes Relative: 9 %
Neutro Abs: 11.3 10*3/uL — ABNORMAL HIGH (ref 1.7–7.7)
Neutrophils Relative %: 82 %
Platelets: 286 10*3/uL (ref 150–400)
RBC: 3.56 MIL/uL — ABNORMAL LOW (ref 4.22–5.81)
RDW: 17.3 % — ABNORMAL HIGH (ref 11.5–15.5)
WBC: 13.8 10*3/uL — ABNORMAL HIGH (ref 4.0–10.5)
nRBC: 0 % (ref 0.0–0.2)

## 2020-12-23 LAB — GLUCOSE, CAPILLARY
Glucose-Capillary: 167 mg/dL — ABNORMAL HIGH (ref 70–99)
Glucose-Capillary: 214 mg/dL — ABNORMAL HIGH (ref 70–99)
Glucose-Capillary: 160 mg/dL — ABNORMAL HIGH (ref 70–99)
Glucose-Capillary: 140 mg/dL — ABNORMAL HIGH (ref 70–99)

## 2020-12-23 LAB — MAGNESIUM: Magnesium: 1.9 mg/dL (ref 1.7–2.4)

## 2020-12-23 MED ORDER — FENTANYL 25 MCG/HR TD PT72
1.0000 | MEDICATED_PATCH | TRANSDERMAL | Status: DC
  Administered 2020-12-23 – 2020-12-29 (×3): 1 via TRANSDERMAL
  Filled 2020-12-23 (×3): qty 1

## 2020-12-23 NOTE — Plan of Care (Signed)

## 2020-12-23 NOTE — Plan of Care (Signed)
  Problem: Clinical Measurements: Goal: Will remain free from infection Outcome: Progressing Goal: Diagnostic test results will improve Outcome: Progressing   Problem: Coping: Goal: Level of anxiety will decrease Outcome: Progressing   

## 2020-12-23 NOTE — Progress Notes (Signed)
OT Cancellation Note  Patient Details Name: Kyle Walton MRN: [id number] DOB: 1956/06/07   Cancelled Treatment:    Reason Eval/Treat Not Completed: Fatigue/lethargy limiting ability to participate. [name] reports that he is "a little worn out from PT session earlier", requests OT return later or tomorrow. OT will follow up next available time  Britt Bottom 12/23/2020, 2:40 PM

## 2020-12-23 NOTE — Progress Notes (Signed)
PROGRESS NOTE    Kyle Walton  [id number] DOB: 1955/08/27 DOA: 12/03/2020 PCP: Patient, No Pcp Per (Inactive)     Brief Narrative:  65 year old WM PMHx  CAD/CABG, diastolic CHF, PAF/flutter status post AICD, DM-2 with PVD s/p several toe amputations, ESRD on HD M/W/F, , OSA and debility   Presenting with right leg pain and swelling and admitted for sepsis due to right lower extremity cellulitis, and 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  He has been started on sodium thiosulfate with HD.   Patient has bilateral lower extremity weakness, right greater than left.  Had extensive work-up including MRI C/T/L spine which did not reveal cord compression.  MRI pelvis showed diffuse edema, right greater than left.  Neurology recommended outpatient follow-up for EMG/nerve conduction study if no improvement in 3 weeks, and signed off.   Cardiology following for PAF/a flutter/prolonged QTc.  Amiodarone increased and digoxin added.  Plan for cardioversion down the road either inpatient or outpatient.   Therapy recommended SNF but better insurance authorization pending.     Subjective: Afebrile overnight, A/O x4, positive right lateral thigh pain.   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 fib/flutter/prolonged QT-QTc  -Seems to be exaggerated by wide QRS.   -CHA2DS2-VASc score > 4. -Cardiology following. -On Eliquis for anticoagulation. -Plan DCCV in 2 weeks  History of PAD -s/p multiple toe amputations bilaterally, stable -Continue Plavix and statin   RIGHT lateral thigh calciphylaxis -Evaluated by ID, noninfectious  -LE [name] negative for DVT.   -CT of right femur suggestive for soft tissue edema and skin thickening likely due to fluid overload versus cellulitis.  Skin biopsy shows diffuse neutrophilic infiltrate without clear etiology.  PAS staining is negative for fungal hyphae.  With worsening erythema: imaging unremarkable for underlying abscess, DVT negative -Started on sodium thiosulfate with HD per nephrology. -Continue low-dose oxycodone and Lyrica cautiously -Patient rates pain currently at 10/10, unfortunately given patient's ESRD increasing oxycodone, adding morphine, indicated.  -6/16 fentanyl patch 25 mcg.  Monitor closely for oversedation..   Sepsis due to RLE cellulitis- -Sepsis physiology resolved except for leukocytosis. -Completed antibiotic course with vancomycin and cefepime.    Acute toxic encephalopathy,  -Resolved: -5/29 received Narcan with improvement in his mentation. -Minimize opiates or other sedating medications   ESRD on HD M/W/F: Nephrology following. -Dialysis per nephrology   DM type II uncontrolled with hyperglycemia -5/28 hemoglobin A1c= 9.7 - Lantus 15 units daily - Moderate SSI   Hypothyroidism:  -Check TSH. -Continue Synthroid   Lower extremity weakness/acute on chronic ambulatory  dysfunction: right greater than left - appears to be secondary to pain and not a mechanical/impingement issue.  MRI C, T and L-spine without acute cord compression.  MRI pelvis  with diffuse edema in the right pelvis more than left.  Neurology signed off.  Therapy recommended SNF.  Bed offer and insurance authorization pending. -Outpatient neurology follow-up for EMG/nerve conduction study if symptoms persist for > 3 weeks.  OSA:  -Continue CPAP nightly   Legally blind   Leukocytosis:  Somewhat persistent.  Not febrile.  Not on a steroid.  At this point, unclear source. -Improving   Morbid Obesity: Body mass index is 46.82 kg/m.   Pressure skin injury: Pressure Injury 12/11/20 Sacrum Right;Left;Mid Stage 2 -  Partial thickness loss of dermis presenting as a shallow open injury with a red, pink wound bed without slough. (Active)  12/11/20 0551  Location: Sacrum  Location Orientation: Right;Left;Mid  Staging: Stage 2 -  Partial thickness loss of dermis presenting as a shallow open injury with a red, pink wound bed without slough.  Wound Description (Comments):   Present on Admission:     Obese (BMI 37.96 kg/m) -    DVT prophylaxis: Eliquis Code Status: Full Family Communication:  Status is: Inpatient    Dispo: The patient is from: Home              Anticipated d/c is to: SNF              Anticipated d/c date is: 6/19              Patient currently is medically stable to d/c.      Consultants:  Nephrology  Procedures/Significant Events:    I have personally reviewed and interpreted all radiology studies and my findings are as above.  VENTILATOR SETTINGS:    Cultures   Antimicrobials:    Devices    LINES / TUBES:      Continuous Infusions:  sodium thiosulfate infusion for calciphylaxis 25 g (12/22/20 1100)     Objective: Vitals:   12/22/20 2152 12/23/20 0557 12/23/20 0950 12/23/20 1337  BP: 107/62 (!) 106/54 (!) 100/48   Pulse: 93 97 99   Resp: '16 18 19   '$ Temp: 98.2 F (36.8 C) 97.8 F (36.6 C) 98 F (36.7 C)   TempSrc: Oral Oral Oral   SpO2: (!) 84% 94% 92%   Weight:    120 kg  Height:         Intake/Output Summary (Last 24 hours) at 12/23/2020 1627 Last data filed at 12/23/2020 1600 Gross per 24 hour  Intake 1140 ml  Output 0 ml  Net 1140 ml   Filed Weights   12/22/20 0740 12/22/20 1142 12/23/20 1337  Weight: (!) 150 kg (!) 148 kg 120 kg    Examination:  General: A/O x4, No acute respiratory distress Eyes: negative scleral hemorrhage, negative anisocoria, negative icterus ENT: Negative Runny nose, negative gingival bleeding, Neck:  Negative scars, masses, torticollis, lymphadenopathy, JVD Lungs: Clear to auscultation bilaterally without wheezes or crackles Cardiovascular: Regular rate and rhythm without murmur gallop or rub normal S1 and S2 Abdomen: Obese, negative abdominal pain, nondistended, positive soft, bowel sounds, no rebound, no ascites, no appreciable mass Extremities: No significant cyanosis, clubbing, or edema bilateral lower extremities Skin: RIGHT lateral thigh area consistent with calciphylaxis (power out unable to obtain good look) Psychiatric:  Negative depression, negative anxiety, negative fatigue, negative mania  Central nervous system:  Cranial nerves II through XII intact, tongue/uvula midline,  all extremities muscle strength 5/5, sensation intact throughout, negative dysarthria, negative expressive aphasia, negative receptive aphasia.  .     Data Reviewed: Care during the described time interval was provided by me .  I have reviewed this patient's available data, including medical history, events of note, physical examination, and all test results as part of my evaluation.  CBC: Recent Labs  Lab 12/17/20 0731 12/19/20 0550 12/20/20 1209 12/22/20 0454 12/23/20 0213  WBC 20.7* 14.5* 15.3* 18.2* 13.8*  NEUTROABS  --   --  12.5*  --  11.3*  HGB 10.4* 10.2* 10.8* 10.4* 10.0*  HCT 32.4* 31.6* 33.9* 33.2* 33.0*  MCV 90.5 89.8 89.7 90.7 92.7  PLT 317 306 338 305 [id number]   Basic Metabolic Panel: Recent Labs  Lab 12/17/20 0731 12/19/20 0550  12/20/20 1209 12/22/20 0454 12/23/20 0213  NA 137 139  --  139  --   K 3.8 3.9  --  4.0  --   CL 97* 96*  --  96*  --   CO2 22 22  --  25  --   GLUCOSE 134* 136*  --  128*  --   BUN 41* 36*  --  37*  --   CREATININE 5.07* 4.95*  --  5.70*  --   CALCIUM 9.2 9.1  --  9.1  --   MG  --   --  2.0  --  1.9  PHOS 4.5  --   --   --   --    GFR: Estimated Creatinine Clearance: 16.8 mL/min (A) (by C-G formula based on SCr of 5.7 mg/dL (H)). Liver Function Tests: Recent Labs  Lab 12/17/20 0731  ALBUMIN 2.4*   No results for input(s): LIPASE, AMYLASE in the last 168 hours. No results for input(s): AMMONIA in the last 168 hours. Coagulation Profile: No results for input(s): INR, PROTIME in the last 168 hours. Cardiac Enzymes: No results for input(s): CKTOTAL, CKMB, CKMBINDEX, TROPONINI in the last 168 hours. BNP (last 3 results) No results for input(s): PROBNP in the last 8760 hours. HbA1C: No results for input(s): HGBA1C in the last 72 hours. CBG: Recent Labs  Lab 12/22/20 1652 12/22/20 2159 12/23/20 0635 12/23/20 1125 12/23/20 1609  GLUCAP 123* 143* 167* 214* 160*   Lipid Profile: No results for input(s): CHOL, HDL, LDLCALC, TRIG, CHOLHDL, LDLDIRECT in the last 72 hours. Thyroid Function Tests: No results for input(s): TSH, T4TOTAL, FREET4, T3FREE, THYROIDAB in the last 72 hours. Anemia Panel: No results for input(s): VITAMINB12, FOLATE, FERRITIN, TIBC, IRON, RETICCTPCT in the last 72 hours. Sepsis Labs: No results for input(s): PROCALCITON, LATICACIDVEN in the last 168 hours.  Recent Results (from the past 240 hour(s))  MRSA PCR Screening     Status: None   Collection Time: 12/15/20  8:23 AM   Specimen: Nasopharyngeal  Result Value Ref Range Status   MRSA by PCR NEGATIVE NEGATIVE Final    Comment:        The GeneXpert MRSA Assay (FDA approved for NASAL specimens only), is one component of a comprehensive MRSA colonization surveillance program. It is not intended  to diagnose MRSA infection nor to guide or monitor treatment for MRSA infections. Performed at [facility], [place] [address] [place], [place] [address]   Resp Panel by RT-PCR (Flu A&B, Covid) Nasopharyngeal Swab     Status: None   Collection Time: 12/20/20  8:06 AM   Specimen: Nasopharyngeal Swab; Nasopharyngeal(NP) swabs in vial transport medium  Result Value  Ref Range Status   SARS Coronavirus 2 by RT PCR NEGATIVE NEGATIVE Final    Comment: (NOTE) SARS-CoV-2 target nucleic acids are NOT DETECTED.  The SARS-CoV-2 RNA is generally detectable in upper respiratory specimens during the acute phase of infection. The lowest concentration of SARS-CoV-2 viral copies this assay can detect is 138 copies/mL. A negative result does not preclude SARS-Cov-2 infection and should not be used as the sole basis for treatment or other patient management decisions. A negative result may occur with  improper specimen collection/handling, submission of specimen other than nasopharyngeal swab, presence of viral mutation(s) within the areas targeted by this assay, and inadequate number of viral copies(<138 copies/mL). A negative result must be combined with clinical observations, patient history, and epidemiological information. The expected result is Negative.  Fact Sheet for Patients:  EntrepreneurPulse.com.au  Fact Sheet for Healthcare Providers:  IncredibleEmployment.be  This test is no t yet approved or cleared by the [place] FDA and  has been authorized for detection and/or diagnosis of SARS-CoV-2 by FDA under an Emergency Use Authorization (EUA). This EUA will remain  in effect (meaning this test can be used) for the duration of the COVID-19 declaration under Section 564(b)(1) of the Act, 21 U.S.C.section 360bbb-3(b)(1), unless the authorization is terminated  or revoked sooner.       Influenza A by PCR NEGATIVE NEGATIVE Final   Influenza B  by PCR NEGATIVE NEGATIVE Final    Comment: (NOTE) The Xpert Xpress SARS-CoV-2/FLU/RSV plus assay is intended as an aid in the diagnosis of influenza from Nasopharyngeal swab specimens and should not be used as a sole basis for treatment. Nasal washings and aspirates are unacceptable for Xpert Xpress SARS-CoV-2/FLU/RSV testing.  Fact Sheet for Patients: EntrepreneurPulse.com.au  Fact Sheet for Healthcare Providers: IncredibleEmployment.be  This test is not yet approved or cleared by the [place] FDA and has been authorized for detection and/or diagnosis of SARS-CoV-2 by FDA under an Emergency Use Authorization (EUA). This EUA will remain in effect (meaning this test can be used) for the duration of the COVID-19 declaration under Section 564(b)(1) of the Act, 21 U.S.C. section 360bbb-3(b)(1), unless the authorization is terminated or revoked.  Performed at [facility], [place] [address], [place], [place] [address]   Culture, blood (routine x 2)     Status: None (Preliminary result)   Collection Time: 12/21/20 12:18 PM   Specimen: BLOOD  Result Value Ref Range Status   Specimen Description BLOOD RIGHT ANTECUBITAL  Final   Special Requests   Final    BOTTLES DRAWN AEROBIC AND ANAEROBIC Blood Culture adequate volume   Culture   Final    NO GROWTH 2 DAYS Performed at [facility], [place] [address] [place], [place] [address]    Report Status PENDING  Incomplete  Culture, blood (routine x 2)     Status: None (Preliminary result)   Collection Time: 12/21/20 12:23 PM   Specimen: BLOOD LEFT HAND  Result Value Ref Range Status   Specimen Description BLOOD LEFT HAND  Final   Special Requests   Final    BOTTLES DRAWN AEROBIC AND ANAEROBIC Blood Culture adequate volume   Culture   Final    NO GROWTH 2 DAYS Performed at [facility], [place] [address], [place], [place] [address]    Report Status PENDING  Incomplete          Radiology Studies: No results found.      Scheduled Meds:  (feeding supplement)  PROSource Plus  30 mL Oral BID BM   amiodarone  400 mg Oral BID   apixaban  5 mg Oral BID   atorvastatin  40 mg Oral Daily   Chlorhexidine Gluconate Cloth  6 each Topical Q0600   Chlorhexidine Gluconate Cloth  6 each Topical Q0600   Chlorhexidine Gluconate Cloth  6 each Topical Q0600   cholestyramine light  4 g Oral Q12H   clopidogrel  75 mg Oral Daily   darbepoetin (ARANESP) injection - DIALYSIS  60 mcg Intravenous Q Wed-HD   digoxin  0.0625 mg Oral QODAY   docusate sodium  100 mg Oral BID   insulin aspart  0-15 Units Subcutaneous TID WC   insulin glargine  15 Units Subcutaneous QHS   levothyroxine  12.5 mcg Oral QAC breakfast   lidocaine   Topical TID   metoprolol succinate  12.5 mg Oral Daily   midodrine  10 mg Oral TID WC   multivitamin  1 tablet Oral QHS   polyethylene glycol  17 g Oral Daily   pregabalin  25 mg Oral Daily   senna-docusate  1 tablet Oral BID   sevelamer carbonate  1,600 mg Oral TID WC   Continuous Infusions:  sodium thiosulfate infusion for calciphylaxis 25 g (12/22/20 1100)     LOS: 20 days    Time spent:40 min    Leighanne Adolph, Geraldo Docker, MD [facility]   If 7PM-7AM, please contact night-coverage 12/23/2020, 4:27 PM

## 2020-12-23 NOTE — Progress Notes (Signed)
[facility] Progress Note   Subjective: Seen in room. Erythema R lateral thigh more pronounced today. Painful to touch. AFlutter on monitor. HR 106. Denies CP/SOB. HD tomorrow on schedule.   Objective Vitals:   12/22/20 1322 12/22/20 1748 12/22/20 2152 12/23/20 0557  BP: 103/81 (!) 113/47 107/62 (!) 106/54  Pulse: (!) 104 97 93 97  Resp: '18 20 16 18  '$ Temp: 98.2 F (36.8 C) 98 F (36.7 C) 98.2 F (36.8 C) 97.8 F (36.6 C)  TempSrc: Oral  Oral Oral  SpO2: 93% 93% (!) 84% 94%  Weight:      Height:       Physical Exam General: Chronically ill obese older male in NAD Heart: S1,S2, RRR  Lungs: CTAB Abdomen: S, NT Extremities: No LE edema. Erythema and swelling RU lateral thigh.  Dialysis Access: [facility] drsg intact    Additional Objective Labs: Basic Metabolic Panel: Recent Labs  Lab 12/16/20 0937 12/17/20 0731 12/19/20 0550 12/22/20 0454  NA  --  137 139 139  K  --  3.8 3.9 4.0  CL  --  97* 96* 96*  CO2  --  '22 22 25  '$ GLUCOSE  --  134* 136* 128*  BUN  --  41* 36* 37*  CREATININE  --  5.07* 4.95* 5.70*  CALCIUM  --  9.2 9.1 9.1  PHOS 3.2 4.5  --   --    Liver Function Tests: Recent Labs  Lab 12/17/20 0731  ALBUMIN 2.4*   No results for input(s): LIPASE, AMYLASE in the last 168 hours. CBC: Recent Labs  Lab 12/17/20 0731 12/19/20 0550 12/20/20 1209 12/22/20 0454 12/23/20 0213  WBC 20.7* 14.5* 15.3* 18.2* 13.8*  NEUTROABS  --   --  12.5*  --  11.3*  HGB 10.4* 10.2* 10.8* 10.4* 10.0*  HCT 32.4* 31.6* 33.9* 33.2* 33.0*  MCV 90.5 89.8 89.7 90.7 92.7  PLT 317 306 338 305 286   Blood Culture    Component Value Date/Time   SDES BLOOD LEFT HAND 12/21/2020 1223   SPECREQUEST  12/21/2020 1223    BOTTLES DRAWN AEROBIC AND ANAEROBIC Blood Culture adequate volume   CULT  12/21/2020 1223    NO GROWTH 2 DAYS Performed at [facility] [address], [place], [place] [address]    REPTSTATUS PENDING 12/21/2020 1223    Cardiac  Enzymes: No results for input(s): CKTOTAL, CKMB, CKMBINDEX, TROPONINI in the last 168 hours. CBG: Recent Labs  Lab 12/22/20 0635 12/22/20 1308 12/22/20 1652 12/22/20 2159 12/23/20 0635  GLUCAP 130* 114* 123* 143* 167*   Iron Studies: No results for input(s): IRON, TIBC, TRANSFERRIN, FERRITIN in the last 72 hours. '@lablastinr3'$ @ Studies/Results: No results found. Medications:  sodium thiosulfate infusion for calciphylaxis 25 g (12/22/20 1100)    (feeding supplement) PROSource Plus  30 mL Oral BID BM   amiodarone  400 mg Oral BID   apixaban  5 mg Oral BID   atorvastatin  40 mg Oral Daily   Chlorhexidine Gluconate Cloth  6 each Topical Q0600   Chlorhexidine Gluconate Cloth  6 each Topical Q0600   Chlorhexidine Gluconate Cloth  6 each Topical Q0600   cholestyramine light  4 g Oral Q12H   clopidogrel  75 mg Oral Daily   darbepoetin (ARANESP) injection - DIALYSIS  60 mcg Intravenous Q Wed-HD   digoxin  0.0625 mg Oral QODAY   docusate sodium  100 mg Oral BID   insulin aspart  0-15 Units Subcutaneous TID WC  insulin glargine  15 Units Subcutaneous QHS   levothyroxine  12.5 mcg Oral QAC breakfast   lidocaine   Topical TID   metoprolol succinate  12.5 mg Oral Daily   midodrine  10 mg Oral TID WC   multivitamin  1 tablet Oral QHS   polyethylene glycol  17 g Oral Daily   pregabalin  25 mg Oral Daily   senna-docusate  1 tablet Oral BID   sevelamer carbonate  1,600 mg Oral TID WC     Assessment/ Plan: Aflutter: Seen by EP. Increased amiodarone, started digoxin. HR still >100.  Plan for future cardioversion. On Apixaban.  Right leg pain: MRI negative.  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  The calcium based binders and VDRA were dc'd and pt started on sodium thiosulfate tiw w/ HD IV.  The skin punch biopsy consistent with infection/cellulitis. We discussed with Dr. 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  Plan is to continue IV sodium thiosulfate and continue to avoid Ca/ vit D products and use 2.0 (low) Ca++ bath. Pain and tenderness are slowly improving. If resolving it can takes months. For dc tomorrow to SNF facility per the pt. 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 Volume removal impeded by Aflutter. Attempting to use midodrine for BP support. Net UF 2.0L 12/22/20.  Right LE cellulitis - status post course of IV antibiotics, blood cultures neg. EP concerned about possible seeding of AICD.  BC X 2 12/21/20 NGTD.  ESRD: MWF HD. Next HD 12/24/20.     Anemia: Hemoglobin at goal > 10.  Getting Darbe 60 ug weekly here, continue. BMD- Ca+/PO4 have been at goal. Recheck labs with HD tomorrow.  Volume: Volume removal has been tenuous D/T for intradialytic hypotension/Aflutter. Continue midodrine, UF as tolerated. Bed weights not correct. Asked staff to re-calibrate bed.  8. Nutrition: Albumin low. Add protein supps. 9. DM-per primary  Jimmye Norman. Ayliana Casciano NP-C 12/23/2020, 8:53 AM  [employer] [phone]

## 2020-12-23 NOTE — Progress Notes (Signed)
Discussed with Dr. Caryl Comes Plans per his note yesterday. I have made a follow up appointment with the AFib clinic to see the patient assess his medicines and adjust for rate/rhythm as needed and plan for DCCV. Need to maintain uninterrupted anticoagulation  Eliquis '5mg'$  BID  EP will sign off though remain available, please recall if needed.  Tommye Standard, PA-C

## 2020-12-23 NOTE — Progress Notes (Signed)
Physical Therapy Treatment Patient Details Name: Kyle Walton MRN: [id number] DOB: 01-13-1956 Today's Date: 12/23/2020    History of Present Illness Pt is a 65 y.o. male who presented 5/27 with R leg cellulitis and sepsis. PMH: PAF, OSA, MI, ESRD on HD MWF, DM on insulin, s/p multiple toe amputations, morbid obesity, ESBL/VRE/MRSA and CAD.    PT Comments    Pt fully participated in session. Pt with improved bed mobility, but continues to be limited with sit<>stand trransfer requiring +2 mod and use of stedy. Pt will benefit from skilled PT to address deficits in balance, strength, coordination, endurance and pain to maximize independence with functional mobility prior to discharge.    Follow Up Recommendations  SNF     Equipment Recommendations  3in1 (PT);Wheelchair (measurements PT);Wheelchair cushion (measurements PT);Hospital bed;Other (comment)    Recommendations for Other Services       Precautions / Restrictions      Mobility  Bed Mobility Overal bed mobility: Needs Assistance Bed Mobility: Sidelying to Sit;Sit to Sidelying;Rolling Rolling: Supervision Sidelying to sit: Min guard (use of bedrails)     Sit to sidelying: Min assist General bed mobility comments: use of bedrails    Transfers Overall transfer level: Needs assistance   Transfers: Sit to/from Stand Sit to Stand: Mod assist;+2 physical assistance         General transfer comment: cueing and demonstration needed to tuck hips, mod A x 2 for transfer from bed; While in stedy performed multiple sit to stand from stedy seat wtih UE resting on cross bar requiring min A from therapist  Ambulation/Gait                 Stairs             Wheelchair Mobility    Modified Rankin (Stroke Patients Only)       Balance Overall balance assessment: Needs assistance Sitting-balance support: Feet supported Sitting balance-Leahy Scale: Fair     Standing balance support: Bilateral upper  extremity supported;During functional activity Standing balance-Leahy Scale: Poor Standing balance comment: requires stedy for task                            Cognition                                              Exercises      General Comments General comments (skin integrity, edema, etc.): VSS on RA      Pertinent Vitals/Pain Pain Score: 6  Pain Location: R lateral leg with movement, R thigh with touch    Home Living                      Prior Function            PT Goals (current goals can now be found in the care plan section) Acute Rehab PT Goals Patient Stated Goal: Post-acute rehab PT Goal Formulation: With patient Time For Goal Achievement: 01/04/21 Potential to Achieve Goals: Good Progress towards PT goals: Progressing toward goals    Frequency    Min 2X/week      PT Plan Current plan remains appropriate    Co-evaluation              AM-PAC PT "6 Clicks" Mobility   Outcome Measure  Help needed turning from your back to your side while in a flat bed without using bedrails?: None Help needed moving from lying on your back to sitting on the side of a flat bed without using bedrails?: A Little Help needed moving to and from a bed to a chair (including a wheelchair)?: Total Help needed standing up from a chair using your arms (e.g., wheelchair or bedside chair)?: Total Help needed to walk in hospital room?: Total Help needed climbing 3-5 steps with a railing? : Total 6 Click Score: 11    End of Session Equipment Utilized During Treatment: Gait belt Activity Tolerance: Patient tolerated treatment well;Other (comment) (R thigh pain) Patient left: in bed;with call bell/phone within reach;with bed alarm set Nurse Communication: Mobility status;Need for lift equipment PT Visit Diagnosis: Muscle weakness (generalized) (M62.81);Difficulty in walking, not elsewhere classified (R26.2);Pain Pain - Right/Left:  Right Pain - part of body: Leg     Time: 1255-1317 PT Time Calculation (min) (ACUTE ONLY): 22 min  Charges:  $Therapeutic Exercise: 8-22 mins                     Lyanne Co, DPT Acute Rehabilitation Services [id number]    [place] 12/23/2020, 1:55 PM

## 2020-12-24 DIAGNOSIS — N186 End stage renal disease: Secondary | ICD-10-CM | POA: Diagnosis not present

## 2020-12-24 DIAGNOSIS — L03115 Cellulitis of right lower limb: Secondary | ICD-10-CM | POA: Diagnosis not present

## 2020-12-24 DIAGNOSIS — I5032 Chronic diastolic (congestive) heart failure: Secondary | ICD-10-CM | POA: Diagnosis not present

## 2020-12-24 DIAGNOSIS — E1151 Type 2 diabetes mellitus with diabetic peripheral angiopathy without gangrene: Secondary | ICD-10-CM | POA: Diagnosis not present

## 2020-12-24 LAB — LIPID PANEL
Cholesterol: 69 mg/dL (ref 0–200)
HDL: 16 mg/dL — ABNORMAL LOW (ref >40)
LDL Cholesterol: 31 mg/dL (ref 0–99)
Total CHOL/HDL Ratio: 4.3 RATIO
Triglycerides: 108 mg/dL (ref <150)
VLDL: 22 mg/dL (ref 0–40)

## 2020-12-24 LAB — CBC WITH DIFFERENTIAL/PLATELET
Abs Immature Granulocytes: 0.09 10*3/uL — ABNORMAL HIGH (ref 0.00–0.07)
Basophils Absolute: 0.1 10*3/uL (ref 0.0–0.1)
Basophils Relative: 1 %
Eosinophils Absolute: 0.2 10*3/uL (ref 0.0–0.5)
Eosinophils Relative: 2 %
HCT: 32.6 % — ABNORMAL LOW (ref 39.0–52.0)
Hemoglobin: 10.3 g/dL — ABNORMAL LOW (ref 13.0–17.0)
Immature Granulocytes: 1 %
Lymphocytes Relative: 8 %
Lymphs Abs: 1 10*3/uL (ref 0.7–4.0)
MCH: 28.8 pg (ref 26.0–34.0)
MCHC: 31.6 g/dL (ref 30.0–36.0)
MCV: 91.1 fL (ref 80.0–100.0)
Monocytes Absolute: 1.4 10*3/uL — ABNORMAL HIGH (ref 0.1–1.0)
Monocytes Relative: 11 %
Neutro Abs: 10 10*3/uL — ABNORMAL HIGH (ref 1.7–7.7)
Neutrophils Relative %: 77 %
Platelets: 309 10*3/uL (ref 150–400)
RBC: 3.58 MIL/uL — ABNORMAL LOW (ref 4.22–5.81)
RDW: 17.7 % — ABNORMAL HIGH (ref 11.5–15.5)
WBC: 12.7 10*3/uL — ABNORMAL HIGH (ref 4.0–10.5)
nRBC: 0 % (ref 0.0–0.2)

## 2020-12-24 LAB — COMPREHENSIVE METABOLIC PANEL
ALT: 9 U/L (ref 0–44)
AST: 32 U/L (ref 15–41)
Albumin: 2.2 g/dL — ABNORMAL LOW (ref 3.5–5.0)
Alkaline Phosphatase: 126 U/L (ref 38–126)
Anion gap: 13 (ref 5–15)
BUN: 32 mg/dL — ABNORMAL HIGH (ref 8–23)
CO2: 27 mmol/L (ref 22–32)
Calcium: 8.7 mg/dL — ABNORMAL LOW (ref 8.9–10.3)
Chloride: 97 mmol/L — ABNORMAL LOW (ref 98–111)
Creatinine, Ser: 5.34 mg/dL — ABNORMAL HIGH (ref 0.61–1.24)
GFR, Estimated: 11 mL/min — ABNORMAL LOW (ref >60)
Glucose, Bld: 193 mg/dL — ABNORMAL HIGH (ref 70–99)
Potassium: 4.2 mmol/L (ref 3.5–5.1)
Sodium: 137 mmol/L (ref 135–145)
Total Bilirubin: 1.2 mg/dL (ref 0.3–1.2)
Total Protein: 6.1 g/dL — ABNORMAL LOW (ref 6.5–8.1)

## 2020-12-24 LAB — PHOSPHORUS: Phosphorus: 3.7 mg/dL (ref 2.5–4.6)

## 2020-12-24 LAB — GLUCOSE, CAPILLARY
Glucose-Capillary: 173 mg/dL — ABNORMAL HIGH (ref 70–99)
Glucose-Capillary: 139 mg/dL — ABNORMAL HIGH (ref 70–99)
Glucose-Capillary: 155 mg/dL — ABNORMAL HIGH (ref 70–99)

## 2020-12-24 LAB — MAGNESIUM: Magnesium: 1.7 mg/dL (ref 1.7–2.4)

## 2020-12-24 MED ORDER — HEPARIN SODIUM (PORCINE) 1000 UNIT/ML DIALYSIS: 20.0000 [IU]/kg | INTRAMUSCULAR | Status: DC | PRN

## 2020-12-24 MED ORDER — PENTAFLUOROPROP-TETRAFLUOROETH EX AERO: 1.0000 "application " | INHALATION_SPRAY | CUTANEOUS | Status: DC | PRN

## 2020-12-24 MED ORDER — HEPARIN SODIUM (PORCINE) 1000 UNIT/ML DIALYSIS: 1000.0000 [IU] | INTRAMUSCULAR | Status: DC | PRN

## 2020-12-24 MED ORDER — ALTEPLASE 2 MG IJ SOLR: 2.0000 mg | Freq: Once | INTRAMUSCULAR | Status: DC | PRN

## 2020-12-24 MED ORDER — LIDOCAINE HCL (PF) 1 % IJ SOLN: 5.0000 mL | INTRAMUSCULAR | Status: DC | PRN

## 2020-12-24 MED ORDER — INSULIN ASPART 100 UNIT/ML IJ SOLN
0.0000 [IU] | INTRAMUSCULAR | Status: DC
  Administered 2020-12-25: 1 [IU] via SUBCUTANEOUS
  Administered 2020-12-25: 2 [IU] via SUBCUTANEOUS
  Administered 2020-12-25 – 2020-12-26 (×5): 1 [IU] via SUBCUTANEOUS
  Administered 2020-12-27 (×2): 2 [IU] via SUBCUTANEOUS
  Administered 2020-12-27: 3 [IU] via SUBCUTANEOUS
  Administered 2020-12-27: 2 [IU] via SUBCUTANEOUS
  Administered 2020-12-28 – 2020-12-29 (×6): 1 [IU] via SUBCUTANEOUS
  Administered 2020-12-30: 2 [IU] via SUBCUTANEOUS
  Administered 2020-12-30: 1 [IU] via SUBCUTANEOUS
  Administered 2020-12-31: 2 [IU] via SUBCUTANEOUS
  Administered 2020-12-31: 3 [IU] via SUBCUTANEOUS
  Administered 2020-12-31: 1 [IU] via SUBCUTANEOUS

## 2020-12-24 MED ORDER — HEPARIN SODIUM (PORCINE) 1000 UNIT/ML IJ SOLN
INTRAMUSCULAR | Status: AC
  Administered 2020-12-24: 4200 [IU] via ARTERIOVENOUS_FISTULA
  Filled 2020-12-24: qty 4

## 2020-12-24 MED ORDER — INSULIN ASPART 100 UNIT/ML IJ SOLN
8.0000 [IU] | Freq: Three times a day (TID) | INTRAMUSCULAR | Status: DC
  Administered 2020-12-25 – 2020-12-31 (×16): 8 [IU] via SUBCUTANEOUS

## 2020-12-24 NOTE — Progress Notes (Signed)
Patient self placed CPAP, patient currently tolerating well.

## 2020-12-24 NOTE — Progress Notes (Signed)
[facility] Progress Note   Subjective:   Seen on HD - 3L UFG and tolerating. No CP. Remains on nasal O2 - says breathing is "the same." R thigh pain slightly improved - very erythematous today.  Objective Vitals:   12/24/20 0845 12/24/20 0850 12/24/20 0900 12/24/20 0930  BP:   127/80 102/74  Pulse:   (!) 105 100  Resp: '17 19 18   '$ Temp:      TempSrc:      SpO2:      Weight:      Height:       Physical Exam General: Chronically ill appearing man. NAD. Nasal O2 in place. Heart: RRR; no murmur Lungs: CTA anteriorly, no wheezing or rales Abdomen: distended; no rash or tenderness Extremities: No LLE rash, trace edema. R thigh with erythema and pitting edema. Dialysis Access: [facility]   Additional Objective Labs: Basic Metabolic Panel: Recent Labs  Lab 12/19/20 0550 12/22/20 0454 12/24/20 0347  NA 139 139 137  K 3.9 4.0 4.2  CL 96* 96* 97*  CO2 '22 25 27  '$ GLUCOSE 136* 128* 193*  BUN 36* 37* 32*  CREATININE 4.95* 5.70* 5.34*  CALCIUM 9.1 9.1 8.7*  PHOS  --   --  3.7   Liver Function Tests: Recent Labs  Lab 12/24/20 0347  AST 32  ALT 9  ALKPHOS 126  BILITOT 1.2  PROT 6.1*  ALBUMIN 2.2*   CBC: Recent Labs  Lab 12/19/20 0550 12/20/20 1209 12/22/20 0454 12/23/20 0213 12/24/20 0347  WBC 14.5* 15.3* 18.2* 13.8* 12.7*  NEUTROABS  --  12.5*  --  11.3* 10.0*  HGB 10.2* 10.8* 10.4* 10.0* 10.3*  HCT 31.6* 33.9* 33.2* 33.0* 32.6*  MCV 89.8 89.7 90.7 92.7 91.1  PLT 306 338 305 286 309   Medications:  sodium thiosulfate infusion for calciphylaxis 25 g (12/22/20 1100)    heparin sodium (porcine)       (feeding supplement) PROSource Plus  30 mL Oral BID BM   amiodarone  400 mg Oral BID   apixaban  5 mg Oral BID   atorvastatin  40 mg Oral Daily   Chlorhexidine Gluconate Cloth  6 each Topical Q0600   cholestyramine light  4 g Oral Q12H   clopidogrel  75 mg Oral Daily   darbepoetin (ARANESP) injection - DIALYSIS  60 mcg Intravenous Q Wed-HD   digoxin   0.0625 mg Oral QODAY   docusate sodium  100 mg Oral BID   fentaNYL  1 patch Transdermal Q72H   insulin aspart  0-15 Units Subcutaneous TID WC   insulin glargine  15 Units Subcutaneous QHS   levothyroxine  12.5 mcg Oral QAC breakfast   lidocaine   Topical TID   metoprolol succinate  12.5 mg Oral Daily   midodrine  10 mg Oral TID WC   multivitamin  1 tablet Oral QHS   polyethylene glycol  17 g Oral Daily   pregabalin  25 mg Oral Daily   senna-docusate  1 tablet Oral BID   sevelamer carbonate  1,600 mg Oral TID WC    Dialysis Orders:  MWF at [facility] 4hr, 450/800, EDW 117kg, 2K/3Ca, TDC, heparin 4900 bolus + 500/hr  Assessment/Plan: 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 Punch Bx performed - c/w infection, but finished course of Vanc/Cefepime without improvement. Imaging without abscess. BCx 6/14 negative. ID consulted - felt c/w calciphylaxis -> start Na Thiosulfate q HD. Ca/Vit D products stopped. 2. ESRD: Continue HD  per usual MWF schedule - HD today. 3. HTN/volume: BP low/stable (on mido) and metop for A-flutter - 3L UFG today to EDW, need to continue to challenge down EDW - had been running into issues with ^ UFG triggering A-flutter. 4. Anemia of ESRD: Hgb 10.3 - stable on Aranesp q Wednesday. 5. Secondary hyperparathyroidism: CorrCa/Phos ok. On Renvela as binder. Using 2Ca bath with HD. Last PTH 67 on 6/9 (low). 6. Nutrition: Alb low - continue protein supplements. 7. T2DM: Per primary. 8. A-flutter: EP consulted. Amiodarone increased and digoxin started. On Eliquis with plan for cardioversion in future. 9. 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 10. CAD/Hx CABG 11. PAD/multiple toe amputations   Veneta Penton, PA-C 12/24/2020, 9:43 AM  [facility]

## 2020-12-24 NOTE — Progress Notes (Signed)
Patient came back from HD. This RN attempted to give daily medication. Patient states that this RN is inconveniencing him. He is on the phone with his wife. This RN asked if he needs me to return to please call this RN when he is ready for his medication.Patient says he will call when he is ready. MD made aware.

## 2020-12-24 NOTE — TOC Progression Note (Signed)
Transition of Care [facility]) - Progression Note    Patient Details  Name: Kyle Walton MRN: [id number] Date of Birth: September 10, 1955  [facility] CM/SW Contact  Sharlet Salina Mila Homer, LCSW Phone Number: 12/24/2020, 8:09 PM  Clinical Narrative:   Talked briefly with patient at bedside regarding ongoing SNF search and attempted 3 times to talk with wife by phone and could not hear [name] each time and was disconnected twice. CSW talked with admissions director at [facility] and was informed that they are out-of-network for [facility]. SNF search will continue.   Expected Discharge Plan: Skilled Nursing Facility Barriers to Discharge: Continued Medical Work up  Expected Discharge Plan and Services Expected Discharge Plan: [facility]         Expected Discharge Date: 12/20/20 . Discharge once facility confirmed.                                    Social Determinants of Health (SDOH) Interventions    Readmission Risk Interventions No flowsheet data found.

## 2020-12-24 NOTE — Progress Notes (Signed)
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  He has been started on sodium thiosulfate with HD.   Patient has bilateral lower extremity weakness, right greater than left.  Had extensive work-up including MRI C/T/L spine which did not reveal cord compression.  MRI pelvis showed diffuse edema, right greater than left.  Neurology recommended outpatient follow-up for EMG/nerve conduction study if no improvement in 3 weeks, and signed off.   Cardiology following for PAF/a flutter/prolonged QTc.  Amiodarone increased and digoxin added.  Plan for cardioversion down the road either inpatient or outpatient.   Therapy recommended SNF but better insurance authorization pending.     Subjective: 6/17 afebrile overnight, patient returned from HD was on phone with wife, informed this was not a convenient time for him to be seen..  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 fib/flutter/prolonged QT-QTc  -Seems to be exaggerated by wide QRS.   -CHA2DS2-VASc score > 4. -Cardiology following. -On Eliquis for anticoagulation. -Plan DCCV in 2 weeks -Cardiology has signed off  History of PAD -s/p multiple toe amputations bilaterally, stable -Continue Plavix and statin   RIGHT lateral thigh calciphylaxis -Evaluated by ID, noninfectious  -LE [name] negative for DVT.   -CT of right femur suggestive for soft tissue edema and skin thickening likely due to fluid overload versus cellulitis.  Skin biopsy shows diffuse neutrophilic infiltrate without clear etiology.  PAS staining is negative for fungal hyphae.  With worsening erythema: imaging unremarkable for underlying abscess, DVT negative -Started on sodium thiosulfate with HD per nephrology. -Continue low-dose oxycodone and Lyrica cautiously -Patient rates pain currently at 10/10, unfortunately given patient's ESRD increasing oxycodone, adding morphine, indicated.  -6/16 fentanyl patch 25 mcg.  Monitor closely for oversedation..   Sepsis due to RLE cellulitis- -Sepsis physiology resolved except for leukocytosis. -Completed antibiotic course with vancomycin and cefepime.    Acute toxic encephalopathy,  -Resolved: -5/29 received Narcan with improvement in his mentation. -Minimize opiates or other sedating medications   ESRD on HD M/W/F:  -Dialysis per nephrology -6/17 ready for discharge from nephrology standpoint   DM type II uncontrolled with  hyperglycemia -5/28 hemoglobin A1c= 9.7 - Lantus 15 units daily -6/17 NovoLog 8 units qac -6/17 sensitive SSI   Hypothyroidism:  -Check TSH. -Continue Synthroid   Lower extremity weakness/acute on chronic  ambulatory dysfunction:  -Right >>> LEFT; appears to be secondary to pain and not a mechanical/impingement issue  -MRI C, T and L-spine without acute cord compression.   -MRI pelvis with diffuse edema in the right pelvis more than left.  Neurology signed off.   -Therapy recommended SNF.  Bed offer and insurance authorization pending. -Outpatient neurology follow-up for EMG/nerve conduction study if symptoms persist for > 3 weeks.  OSA:  -Continue CPAP nightly   Legally blind   Leukocytosis:  Somewhat persistent.  Not febrile.  Not on a steroid.  At this point, unclear source. -Improving   Morbid Obesity: Body mass index is 46.82 kg/m.   Pressure skin injury: Pressure Injury 12/11/20 Sacrum Right;Left;Mid Stage 2 -  Partial thickness loss of dermis presenting as a shallow open injury with a red, pink wound bed without slough. (Active)  12/11/20 0551  Location: Sacrum  Location Orientation: Right;Left;Mid  Staging: Stage 2 -  Partial thickness loss of dermis presenting as a shallow open injury with a red, pink wound bed without slough.  Wound Description (Comments):   Present on Admission:       DVT prophylaxis: Eliquis Code Status: Full Family Communication:  Status is: Inpatient    Dispo: The patient is from: Home              Anticipated d/c is to: SNF              Anticipated d/c date is: 6/19              Patient currently is medically stable to d/c.      Consultants:  Nephrology  Procedures/Significant Events:    I have personally reviewed and interpreted all radiology studies and my findings are as above.  VENTILATOR SETTINGS:    Cultures   Antimicrobials:    Devices    LINES / TUBES:      Continuous Infusions:  sodium thiosulfate infusion for calciphylaxis 25 g (12/24/20 1657)     Objective: Vitals:   12/24/20 1230 12/24/20 1503 12/24/20 1705 12/24/20 1850  BP: 105/63 128/71 107/62 112/65  Pulse: 98 (!) 104 (!) 103 (!) 105   Resp: '17 18 20 20  '$ Temp:   98.2 F (36.8 C) 98.3 F (36.8 C)  TempSrc:   Oral Oral  SpO2: 94% 94% 94% 92%  Weight:      Height:        Intake/Output Summary (Last 24 hours) at 12/24/2020 1938 Last data filed at 12/24/2020 [id number] Gross per 24 hour  Intake 180 ml  Output --  Net 180 ml    Filed Weights   12/22/20 1142 12/23/20 1337 12/24/20 0828  Weight: (!) 148 kg 120 kg 120.9 kg    Examination:  General: A/O x4, No acute respiratory distress Eyes: negative scleral hemorrhage, negative anisocoria, negative icterus ENT: Negative Runny nose, negative gingival bleeding, Neck:  Negative scars, masses, torticollis, lymphadenopathy, JVD Lungs: Clear to auscultation bilaterally without wheezes or crackles Cardiovascular: Regular rate and rhythm without murmur gallop or rub normal S1 and S2 Abdomen: Obese, negative abdominal pain, nondistended, positive soft, bowel sounds, no rebound, no ascites, no appreciable mass Extremities: No significant cyanosis, clubbing, or edema bilateral lower extremities Skin: RIGHT lateral thigh area consistent with calciphylaxis (power out unable to obtain good  look) Psychiatric:  Negative depression, negative anxiety, negative fatigue, negative mania  Central nervous system:  Cranial nerves II through XII intact, tongue/uvula midline, all extremities muscle strength 5/5, sensation intact throughout, negative dysarthria, negative expressive aphasia, negative receptive aphasia.  .     Data Reviewed: Care during the described time interval was provided by me .  I have reviewed this patient's available data, including medical history, events of note, physical examination, and all test results as part of my evaluation.  CBC: Recent Labs  Lab 12/19/20 0550 12/20/20 1209 12/22/20 0454 12/23/20 0213 12/24/20 0347  WBC 14.5* 15.3* 18.2* 13.8* 12.7*  NEUTROABS  --  12.5*  --  11.3* 10.0*  HGB 10.2* 10.8* 10.4* 10.0* 10.3*  HCT 31.6* 33.9* 33.2* 33.0*  32.6*  MCV 89.8 89.7 90.7 92.7 91.1  PLT 306 338 305 286 [id number]    Basic Metabolic Panel: Recent Labs  Lab 12/19/20 0550 12/20/20 1209 12/22/20 0454 12/23/20 0213 12/24/20 0347  NA 139  --  139  --  137  K 3.9  --  4.0  --  4.2  CL 96*  --  96*  --  97*  CO2 22  --  25  --  27  GLUCOSE 136*  --  128*  --  193*  BUN 36*  --  37*  --  32*  CREATININE 4.95*  --  5.70*  --  5.34*  CALCIUM 9.1  --  9.1  --  8.7*  MG  --  2.0  --  1.9 1.7  PHOS  --   --   --   --  3.7    GFR: Estimated Creatinine Clearance: 18 mL/min (A) (by C-G formula based on SCr of 5.34 mg/dL (H)). Liver Function Tests: Recent Labs  Lab 12/24/20 0347  AST 32  ALT 9  ALKPHOS 126  BILITOT 1.2  PROT 6.1*  ALBUMIN 2.2*    No results for input(s): LIPASE, AMYLASE in the last 168 hours. No results for input(s): AMMONIA in the last 168 hours. Coagulation Profile: No results for input(s): INR, PROTIME in the last 168 hours. Cardiac Enzymes: No results for input(s): CKTOTAL, CKMB, CKMBINDEX, TROPONINI in the last 168 hours. BNP (last 3 results) No results for input(s): PROBNP in the last 8760 hours. HbA1C: No results for input(s): HGBA1C in the last 72 hours. CBG: Recent Labs  Lab 12/23/20 1125 12/23/20 1609 12/23/20 2027 12/24/20 0646 12/24/20 1650  GLUCAP 214* 160* 140* 173* 139*    Lipid Profile: Recent Labs    12/24/20 0347  CHOL 69  HDL 16*  LDLCALC 31  TRIG 108  CHOLHDL 4.3   Thyroid Function Tests: No results for input(s): TSH, T4TOTAL, FREET4, T3FREE, THYROIDAB in the last 72 hours. Anemia Panel: No results for input(s): VITAMINB12, FOLATE, FERRITIN, TIBC, IRON, RETICCTPCT in the last 72 hours. Sepsis Labs: No results for input(s): PROCALCITON, LATICACIDVEN in the last 168 hours.  Recent Results (from the past 240 hour(s))  MRSA PCR Screening     Status: None   Collection Time: 12/15/20  8:23 AM   Specimen: Nasopharyngeal  Result Value Ref Range Status   MRSA by PCR NEGATIVE  NEGATIVE Final    Comment:        The GeneXpert MRSA Assay (FDA approved for NASAL specimens only), is one component of a comprehensive MRSA colonization surveillance program. It is not intended to diagnose MRSA infection nor to guide or monitor treatment for MRSA infections. Performed at [facility], [place] [address] [place], [place] [address]   Resp Panel by RT-PCR (Flu A&B, Covid) Nasopharyngeal Swab     Status: None   Collection Time: 12/20/20  8:06 AM   Specimen: Nasopharyngeal Swab; Nasopharyngeal(NP) swabs in vial transport medium  Result Value Ref Range Status   SARS Coronavirus 2 by RT PCR NEGATIVE NEGATIVE Final    Comment: (NOTE) SARS-CoV-2 target nucleic acids are NOT DETECTED.  The SARS-CoV-2 RNA is generally detectable in upper respiratory specimens during the acute phase of infection. The lowest concentration of SARS-CoV-2 viral copies this assay can detect is 138 copies/mL. A negative result does not preclude SARS-Cov-2 infection and should not be used as the sole basis for treatment or other patient management decisions. A negative result may occur with  improper specimen collection/handling, submission of specimen other than nasopharyngeal swab, presence of viral mutation(s) within the areas targeted by this assay, and inadequate number of viral copies(<138 copies/mL). A negative result must be combined with clinical observations, patient history, and epidemiological information. The expected result is Negative.  Fact Sheet for Patients:  EntrepreneurPulse.com.au  Fact Sheet for Healthcare Providers:  IncredibleEmployment.be  This test is no t yet approved or cleared by the [place] FDA and  has been authorized for detection and/or diagnosis of SARS-CoV-2 by FDA under an Emergency Use Authorization (EUA). This EUA will remain  in effect (meaning this test can be used) for the duration of the COVID-19  declaration under Section 564(b)(1) of the Act, 21 U.S.C.section 360bbb-3(b)(1), unless the authorization is terminated  or revoked sooner.       Influenza A by PCR NEGATIVE NEGATIVE Final   Influenza B by PCR NEGATIVE NEGATIVE Final    Comment: (NOTE) The Xpert Xpress SARS-CoV-2/FLU/RSV plus assay is intended as an aid in the diagnosis of influenza from Nasopharyngeal swab specimens and should not be used as a sole basis for treatment. Nasal washings and aspirates are unacceptable for Xpert Xpress SARS-CoV-2/FLU/RSV testing.  Fact Sheet for Patients: EntrepreneurPulse.com.au  Fact Sheet for Healthcare Providers: IncredibleEmployment.be  This test is not yet approved or cleared by the [place] FDA and has been authorized for detection and/or diagnosis of SARS-CoV-2 by FDA under an Emergency Use Authorization (EUA). This EUA will remain in effect (meaning this test can be used) for the duration of the COVID-19 declaration under Section 564(b)(1) of the Act, 21 U.S.C. section 360bbb-3(b)(1), unless the authorization is terminated or revoked.  Performed at [facility], [place] [address], [place], [place] [address]   Culture, blood (routine x 2)     Status: None (Preliminary result)   Collection Time: 12/21/20 12:18 PM   Specimen: BLOOD  Result Value Ref Range Status   Specimen Description BLOOD RIGHT ANTECUBITAL  Final   Special Requests   Final    BOTTLES DRAWN AEROBIC AND ANAEROBIC Blood Culture adequate volume   Culture   Final    NO GROWTH 3 DAYS Performed at [facility], [place] [address] [place], [place] [address]    Report Status PENDING  Incomplete  Culture, blood (routine x 2)     Status: None (Preliminary result)   Collection Time: 12/21/20 12:23 PM   Specimen: BLOOD LEFT HAND  Result Value Ref Range Status   Specimen Description BLOOD LEFT HAND  Final   Special Requests   Final    BOTTLES DRAWN AEROBIC AND  ANAEROBIC Blood Culture adequate volume   Culture   Final  NO GROWTH 3 DAYS Performed at [facility], [place] [address], [place], [place] [address]    Report Status PENDING  Incomplete          Radiology Studies: No results found.      Scheduled Meds:  (feeding supplement) PROSource Plus  30 mL Oral BID BM   amiodarone  400 mg Oral BID   apixaban  5 mg Oral BID   atorvastatin  40 mg Oral Daily   Chlorhexidine Gluconate Cloth  6 each Topical Q0600   cholestyramine light  4 g Oral Q12H   clopidogrel  75 mg Oral Daily   darbepoetin (ARANESP) injection - DIALYSIS  60 mcg Intravenous Q Wed-HD   digoxin  0.0625 mg Oral QODAY   docusate sodium  100 mg Oral BID   fentaNYL  1 patch Transdermal Q72H   insulin aspart  0-15 Units Subcutaneous TID WC   insulin glargine  15 Units Subcutaneous QHS   levothyroxine  12.5 mcg Oral QAC breakfast   lidocaine   Topical TID   metoprolol succinate  12.5 mg Oral Daily   midodrine  10 mg Oral TID WC   multivitamin  1 tablet Oral QHS   polyethylene glycol  17 g Oral Daily   pregabalin  25 mg Oral Daily   senna-docusate  1 tablet Oral BID   sevelamer carbonate  1,600 mg Oral TID WC   Continuous Infusions:  sodium thiosulfate infusion for calciphylaxis 25 g (12/24/20 1657)     LOS: 21 days    Time spent:40 min    Teosha Casso, Geraldo Docker, MD [facility]   If 7PM-7AM, please contact night-coverage 12/24/2020, 7:38 PM

## 2020-12-25 DIAGNOSIS — I5032 Chronic diastolic (congestive) heart failure: Secondary | ICD-10-CM | POA: Diagnosis not present

## 2020-12-25 DIAGNOSIS — N186 End stage renal disease: Secondary | ICD-10-CM | POA: Diagnosis not present

## 2020-12-25 DIAGNOSIS — L03115 Cellulitis of right lower limb: Secondary | ICD-10-CM | POA: Diagnosis not present

## 2020-12-25 DIAGNOSIS — E1151 Type 2 diabetes mellitus with diabetic peripheral angiopathy without gangrene: Secondary | ICD-10-CM | POA: Diagnosis not present

## 2020-12-25 LAB — COMPREHENSIVE METABOLIC PANEL
ALT: 9 U/L (ref 0–44)
AST: 37 U/L (ref 15–41)
Albumin: 2.3 g/dL — ABNORMAL LOW (ref 3.5–5.0)
Alkaline Phosphatase: 149 U/L — ABNORMAL HIGH (ref 38–126)
Anion gap: 19 — ABNORMAL HIGH (ref 5–15)
BUN: 21 mg/dL (ref 8–23)
CO2: 25 mmol/L (ref 22–32)
Calcium: 9.1 mg/dL (ref 8.9–10.3)
Chloride: 97 mmol/L — ABNORMAL LOW (ref 98–111)
Creatinine, Ser: 3.94 mg/dL — ABNORMAL HIGH (ref 0.61–1.24)
GFR, Estimated: 16 mL/min — ABNORMAL LOW (ref >60)
Glucose, Bld: 160 mg/dL — ABNORMAL HIGH (ref 70–99)
Potassium: 4.1 mmol/L (ref 3.5–5.1)
Sodium: 141 mmol/L (ref 135–145)
Total Bilirubin: 1 mg/dL (ref 0.3–1.2)
Total Protein: 6.6 g/dL (ref 6.5–8.1)

## 2020-12-25 LAB — CBC WITH DIFFERENTIAL/PLATELET
Abs Immature Granulocytes: 0.09 10*3/uL — ABNORMAL HIGH (ref 0.00–0.07)
Basophils Absolute: 0.1 10*3/uL (ref 0.0–0.1)
Basophils Relative: 1 %
Eosinophils Absolute: 0.2 10*3/uL (ref 0.0–0.5)
Eosinophils Relative: 1 %
HCT: 34.6 % — ABNORMAL LOW (ref 39.0–52.0)
Hemoglobin: 10.8 g/dL — ABNORMAL LOW (ref 13.0–17.0)
Immature Granulocytes: 1 %
Lymphocytes Relative: 8 %
Lymphs Abs: 1.2 10*3/uL (ref 0.7–4.0)
MCH: 28.3 pg (ref 26.0–34.0)
MCHC: 31.2 g/dL (ref 30.0–36.0)
MCV: 90.8 fL (ref 80.0–100.0)
Monocytes Absolute: 1.7 10*3/uL — ABNORMAL HIGH (ref 0.1–1.0)
Monocytes Relative: 11 %
Neutro Abs: 12.1 10*3/uL — ABNORMAL HIGH (ref 1.7–7.7)
Neutrophils Relative %: 78 %
Platelets: 331 10*3/uL (ref 150–400)
RBC: 3.81 MIL/uL — ABNORMAL LOW (ref 4.22–5.81)
RDW: 17.6 % — ABNORMAL HIGH (ref 11.5–15.5)
WBC: 15.3 10*3/uL — ABNORMAL HIGH (ref 4.0–10.5)
nRBC: 0 % (ref 0.0–0.2)

## 2020-12-25 LAB — GLUCOSE, CAPILLARY
Glucose-Capillary: 128 mg/dL — ABNORMAL HIGH (ref 70–99)
Glucose-Capillary: 129 mg/dL — ABNORMAL HIGH (ref 70–99)
Glucose-Capillary: 133 mg/dL — ABNORMAL HIGH (ref 70–99)
Glucose-Capillary: 150 mg/dL — ABNORMAL HIGH (ref 70–99)
Glucose-Capillary: 176 mg/dL — ABNORMAL HIGH (ref 70–99)
Glucose-Capillary: 93 mg/dL (ref 70–99)

## 2020-12-25 LAB — MAGNESIUM: Magnesium: 2 mg/dL (ref 1.7–2.4)

## 2020-12-25 LAB — PHOSPHORUS: Phosphorus: 2.6 mg/dL (ref 2.5–4.6)

## 2020-12-25 MED ORDER — HYDROCORTISONE 1 % EX CREA
TOPICAL_CREAM | Freq: Two times a day (BID) | CUTANEOUS | Status: DC | PRN
  Filled 2020-12-25: qty 28

## 2020-12-25 NOTE — Progress Notes (Signed)
Pt able to place CPAP himself without assistance from RT.

## 2020-12-25 NOTE — Progress Notes (Signed)
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  He has been started on sodium thiosulfate with HD.   Patient has bilateral lower extremity weakness, right greater than left.  Had extensive work-up including MRI C/T/L spine which did not reveal cord compression.  MRI pelvis showed diffuse edema, right greater than left.  Neurology recommended outpatient follow-up for EMG/nerve conduction study if no improvement in 3 weeks, and signed off.   Cardiology following for PAF/a flutter/prolonged QTc.  Amiodarone increased and digoxin added.  Plan for cardioversion down the road either inpatient or outpatient.   Therapy recommended SNF but better insurance authorization pending.     Subjective: 6/18 afebrile overnight, A/O x4, continue right lateral thigh pain.   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 fib/flutter/prolonged QT-QTc  -Seems to be exaggerated by wide QRS.   -CHA2DS2-VASc score > 4. -Cardiology following. -On Eliquis for anticoagulation. -Plan DCCV in 2 weeks -Cardiology has signed off  History of PAD -s/p multiple toe amputations bilaterally, stable -Continue Plavix and statin   RIGHT lateral thigh calciphylaxis -Evaluated by ID, noninfectious  -LE [name] negative for DVT.   -CT of right femur suggestive for soft tissue edema and skin thickening likely due to fluid overload versus cellulitis.  Skin biopsy shows diffuse neutrophilic infiltrate without clear etiology.  PAS staining is negative for fungal hyphae.  With worsening erythema: imaging unremarkable for underlying abscess, DVT negative -Started on sodium thiosulfate with HD per nephrology. -Continue low-dose oxycodone and Lyrica cautiously -Patient rates pain currently at 10/10, unfortunately given patient's ESRD increasing oxycodone, adding morphine, indicated.  -6/16 fentanyl patch 25 mcg.  Monitor closely for oversedation..   Sepsis due to RLE cellulitis- -Sepsis physiology resolved except for leukocytosis. -Completed antibiotic course with vancomycin and cefepime.    Acute toxic encephalopathy,  -Resolved: -5/29 received Narcan with improvement in his mentation. -Minimize opiates or other sedating medications   ESRD on HD M/W/F:  -Dialysis per nephrology -6/17 ready for discharge from nephrology standpoint   DM type II uncontrolled with hyperglycemia -5/28 hemoglobin A1c= 9.7 - Lantus 15 units daily -6/17 NovoLog 8 units qac -6/17 sensitive  SSI   Hypothyroidism:  -Check TSH. -Continue Synthroid   Lower extremity weakness/acute on chronic ambulatory dysfunction:  -Right >>> LEFT; appears to be secondary to pain  and not a mechanical/impingement issue  -MRI C, T and L-spine without acute cord compression.   -MRI pelvis with diffuse edema in the right pelvis more than left.  Neurology signed off.   -Therapy recommended SNF.  Bed offer and insurance authorization pending. -Outpatient neurology follow-up for EMG/nerve conduction study if symptoms persist for > 3 weeks.  OSA:  -Continue CPAP nightly   Legally blind   Leukocytosis:  Somewhat persistent.  Not febrile.  Not on a steroid.  At this point, unclear source. -Improving   Morbid Obesity: Body mass index is 46.82 kg/m.   Pressure skin injury: Pressure Injury 12/11/20 Sacrum Right;Left;Mid Stage 2 -  Partial thickness loss of dermis presenting as a shallow open injury with a red, pink wound bed without slough. (Active)  12/11/20 0551  Location: Sacrum  Location Orientation: Right;Left;Mid  Staging: Stage 2 -  Partial thickness loss of dermis presenting as a shallow open injury with a red, pink wound bed without slough.  Wound Description (Comments):   Present on Admission:       DVT prophylaxis: Eliquis Code Status: Full Family Communication:  Status is: Inpatient    Dispo: The patient is from: Home              Anticipated d/c is to: SNF              Anticipated d/c date is: 6/19              Patient currently is medically stable to d/c.      Consultants:  Nephrology  Procedures/Significant Events:    I have personally reviewed and interpreted all radiology studies and my findings are as above.  VENTILATOR SETTINGS:    Cultures   Antimicrobials:    Devices    LINES / TUBES:      Continuous Infusions:  sodium thiosulfate infusion for calciphylaxis 25 g (12/24/20 1657)     Objective: Vitals:   12/24/20 2211 12/25/20 0559 12/25/20 0648 12/25/20 0922  BP: 122/65 116/71  133/88  Pulse: (!) 107 100  100  Resp: '19 16  19  '$ Temp: 98.2 F (36.8 C) 97.8 F (36.6 C)  98.3 F (36.8 C)  TempSrc:  Oral Oral    SpO2: 90% 99%  95%  Weight:   118.4 kg   Height:        Intake/Output Summary (Last 24 hours) at 12/25/2020 1745 Last data filed at 12/25/2020 1551 Gross per 24 hour  Intake 832 ml  Output 0 ml  Net 832 ml    Filed Weights   12/24/20 0828 12/24/20 1254 12/25/20 0648  Weight: 120.9 kg 118.4 kg 118.4 kg    Examination:  General: A/O x4, No acute respiratory distress Eyes: negative scleral hemorrhage, negative anisocoria, negative icterus ENT: Negative Runny nose, negative gingival bleeding, Neck:  Negative scars, masses, torticollis, lymphadenopathy, JVD Lungs: Clear to auscultation bilaterally without wheezes or crackles Cardiovascular: Regular rate and rhythm without murmur gallop or rub normal S1 and S2 Abdomen: Obese, negative abdominal pain, nondistended, positive soft, bowel sounds, no rebound, no ascites, no appreciable mass Extremities: No significant cyanosis, clubbing, or edema bilateral lower extremities Skin: RIGHT lateral thigh area consistent with calciphylaxis (power out unable to obtain good look) Psychiatric:  Negative depression, negative anxiety, negative fatigue, negative mania  Central nervous system:  Cranial nerves II through XII intact, tongue/uvula midline, all extremities muscle strength 5/5, sensation intact throughout, negative dysarthria, negative expressive aphasia, negative receptive aphasia.  .     Data Reviewed: Care during the described time interval was provided by me .  I have reviewed this patient's available data, including medical history, events of note, physical examination, and all test results as part of my evaluation.  CBC: Recent Labs  Lab 12/20/20 1209 12/22/20 0454 12/23/20 0213 12/24/20 0347 12/25/20 0430  WBC 15.3* 18.2* 13.8* 12.7* 15.3*  NEUTROABS 12.5*  --  11.3* 10.0* 12.1*  HGB 10.8* 10.4* 10.0* 10.3* 10.8*  HCT 33.9* 33.2* 33.0* 32.6* 34.6*  MCV 89.7 90.7 92.7 91.1 90.8  PLT 338 305 286 309 331     Basic Metabolic Panel: Recent Labs  Lab 12/19/20 0550 12/20/20 1209 12/22/20 0454 12/23/20 0213 12/24/20 0347 12/25/20 0430  NA 139  --  139  --  137 141  K 3.9  --  4.0  --  4.2 4.1  CL 96*  --  96*  --  97* 97*  CO2 22  --  25  --  27 25  GLUCOSE 136*  --  128*  --  193* 160*  BUN 36*  --  37*  --  32* 21  CREATININE 4.95*  --  5.70*  --  5.34* 3.94*  CALCIUM 9.1  --  9.1  --  8.7* 9.1  MG  --  2.0  --  1.9 1.7 2.0  PHOS  --   --   --   --  3.7 2.6    GFR: Estimated Creatinine Clearance: 24.1 mL/min (A) (by C-G formula based on SCr of 3.94 mg/dL (H)). Liver Function Tests: Recent Labs  Lab 12/24/20 0347 12/25/20 0430  AST 32 37  ALT 9 9  ALKPHOS 126 149*  BILITOT 1.2 1.0  PROT 6.1* 6.6  ALBUMIN 2.2* 2.3*    No results for input(s): LIPASE, AMYLASE in the last 168 hours. No results for input(s): AMMONIA in the last 168 hours. Coagulation Profile: No results for input(s): INR, PROTIME in the last 168 hours. Cardiac Enzymes: No results for input(s): CKTOTAL, CKMB, CKMBINDEX, TROPONINI in the last 168 hours. BNP (last 3 results) No results for input(s): PROBNP in the last 8760 hours. HbA1C: No results for input(s): HGBA1C in the last 72 hours. CBG: Recent Labs  Lab 12/24/20 2213 12/25/20 0430 12/25/20 0725 12/25/20 1146 12/25/20 1716  GLUCAP 155* 150* 133* 93 176*    Lipid Profile: Recent Labs    12/24/20 0347  CHOL 69  HDL 16*  LDLCALC 31  TRIG 108  CHOLHDL 4.3    Thyroid Function Tests: No results for input(s): TSH, T4TOTAL, FREET4, T3FREE, THYROIDAB in the last 72 hours. Anemia Panel: No results for input(s): VITAMINB12, FOLATE, FERRITIN, TIBC, IRON, RETICCTPCT in the last 72 hours. Sepsis Labs: No results for input(s): PROCALCITON, LATICACIDVEN in the last 168 hours.  Recent Results (from the past 240 hour(s))  Resp Panel by RT-PCR (Flu A&B, Covid) Nasopharyngeal Swab     Status: None   Collection Time: 12/20/20  8:06 AM    Specimen: Nasopharyngeal Swab; Nasopharyngeal(NP) swabs in vial transport medium  Result Value Ref Range Status   SARS Coronavirus 2 by RT PCR NEGATIVE NEGATIVE Final    Comment: (NOTE) SARS-CoV-2 target nucleic acids are NOT DETECTED.  The SARS-CoV-2 RNA is generally detectable in upper respiratory specimens during the acute phase of infection. The lowest concentration of  SARS-CoV-2 viral copies this assay can detect is 138 copies/mL. A negative result does not preclude SARS-Cov-2 infection and should not be used as the sole basis for treatment or other patient management decisions. A negative result may occur with  improper specimen collection/handling, submission of specimen other than nasopharyngeal swab, presence of viral mutation(s) within the areas targeted by this assay, and inadequate number of viral copies(<138 copies/mL). A negative result must be combined with clinical observations, patient history, and epidemiological information. The expected result is Negative.  Fact Sheet for Patients:  EntrepreneurPulse.com.au  Fact Sheet for Healthcare Providers:  IncredibleEmployment.be  This test is no t yet approved or cleared by the [place] FDA and  has been authorized for detection and/or diagnosis of SARS-CoV-2 by FDA under an Emergency Use Authorization (EUA). This EUA will remain  in effect (meaning this test can be used) for the duration of the COVID-19 declaration under Section 564(b)(1) of the Act, 21 U.S.C.section 360bbb-3(b)(1), unless the authorization is terminated  or revoked sooner.       Influenza A by PCR NEGATIVE NEGATIVE Final   Influenza B by PCR NEGATIVE NEGATIVE Final    Comment: (NOTE) The Xpert Xpress SARS-CoV-2/FLU/RSV plus assay is intended as an aid in the diagnosis of influenza from Nasopharyngeal swab specimens and should not be used as a sole basis for treatment. Nasal washings and aspirates are  unacceptable for Xpert Xpress SARS-CoV-2/FLU/RSV testing.  Fact Sheet for Patients: EntrepreneurPulse.com.au  Fact Sheet for Healthcare Providers: IncredibleEmployment.be  This test is not yet approved or cleared by the [place] FDA and has been authorized for detection and/or diagnosis of SARS-CoV-2 by FDA under an Emergency Use Authorization (EUA). This EUA will remain in effect (meaning this test can be used) for the duration of the COVID-19 declaration under Section 564(b)(1) of the Act, 21 U.S.C. section 360bbb-3(b)(1), unless the authorization is terminated or revoked.  Performed at [facility], [place] [address] [place], [place] [address]   Culture, blood (routine x 2)     Status: None (Preliminary result)   Collection Time: 12/21/20 12:18 PM   Specimen: BLOOD  Result Value Ref Range Status   Specimen Description BLOOD RIGHT ANTECUBITAL  Final   Special Requests   Final    BOTTLES DRAWN AEROBIC AND ANAEROBIC Blood Culture adequate volume   Culture   Final    NO GROWTH 3 DAYS Performed at [facility], [place] [address], [place], [place] [address]    Report Status PENDING  Incomplete  Culture, blood (routine x 2)     Status: None (Preliminary result)   Collection Time: 12/21/20 12:23 PM   Specimen: BLOOD LEFT HAND  Result Value Ref Range Status   Specimen Description BLOOD LEFT HAND  Final   Special Requests   Final    BOTTLES DRAWN AEROBIC AND ANAEROBIC Blood Culture adequate volume   Culture   Final    NO GROWTH 3 DAYS Performed at [facility], [place] [address] [place], [place] [address]    Report Status PENDING  Incomplete          Radiology Studies: No results found.      Scheduled Meds:  (feeding supplement) PROSource Plus  30 mL Oral BID BM   amiodarone  400 mg Oral BID   apixaban  5 mg Oral BID   atorvastatin  40 mg Oral Daily   Chlorhexidine Gluconate Cloth  6 each Topical Q0600    cholestyramine light  4 g Oral Q12H   clopidogrel  75 mg Oral Daily   darbepoetin (ARANESP) injection - DIALYSIS  60 mcg Intravenous Q Wed-HD   digoxin  0.0625 mg Oral QODAY   docusate sodium  100 mg Oral BID   fentaNYL  1 patch Transdermal Q72H   insulin aspart  0-9 Units Subcutaneous Q4H   insulin aspart  8 Units Subcutaneous TID WC   insulin glargine  15 Units Subcutaneous QHS   levothyroxine  12.5 mcg Oral QAC breakfast   lidocaine   Topical TID   metoprolol succinate  12.5 mg Oral Daily   midodrine  10 mg Oral TID WC   multivitamin  1 tablet Oral QHS   polyethylene glycol  17 g Oral Daily   pregabalin  25 mg Oral Daily   senna-docusate  1 tablet Oral BID   sevelamer carbonate  1,600 mg Oral TID WC   Continuous Infusions:  sodium thiosulfate infusion for calciphylaxis 25 g (12/24/20 1657)     LOS: 22 days    Time spent:40 min    Corrie Brannen, Geraldo Docker, MD [facility]   If 7PM-7AM, please contact night-coverage 12/25/2020, 5:45 PM

## 2020-12-25 NOTE — Progress Notes (Signed)
[facility] Progress Note   Subjective:   Seen in room - placement pending. Denies CP or dyspnea. Had several questions about calciphylaxis which we discussed in detail today.  Objective Vitals:   12/24/20 2211 12/25/20 0559 12/25/20 0648 12/25/20 0922  BP: 122/65 116/71  133/88  Pulse: (!) 107 100  100  Resp: '19 16  19  '$ Temp: 98.2 F (36.8 C) 97.8 F (36.6 C)  98.3 F (36.8 C)  TempSrc: Oral Oral    SpO2: 90% 99%  95%  Weight:   118.4 kg   Height:       Physical Exam General: Chronically ill appearing man. NAD. Nasal O2 in place. Heart: RRR; no murmur Lungs: CTA anteriorly, no wheezing or rales Abdomen: distended; no rash or tenderness Extremities: No LLE rash, trace edema. R thigh with erythema and pitting edema. Dialysis Access: [facility]   Additional Objective Labs: Basic Metabolic Panel: Recent Labs  Lab 12/22/20 0454 12/24/20 0347 12/25/20 0430  NA 139 137 141  K 4.0 4.2 4.1  CL 96* 97* 97*  CO2 '25 27 25  '$ GLUCOSE 128* 193* 160*  BUN 37* 32* 21  CREATININE 5.70* 5.34* 3.94*  CALCIUM 9.1 8.7* 9.1  PHOS  --  3.7 2.6   Liver Function Tests: Recent Labs  Lab 12/24/20 0347 12/25/20 0430  AST 32 37  ALT 9 9  ALKPHOS 126 149*  BILITOT 1.2 1.0  PROT 6.1* 6.6  ALBUMIN 2.2* 2.3*   CBC: Recent Labs  Lab 12/20/20 1209 12/22/20 0454 12/23/20 0213 12/24/20 0347 12/25/20 0430  WBC 15.3* 18.2* 13.8* 12.7* 15.3*  NEUTROABS 12.5*  --  11.3* 10.0* 12.1*  HGB 10.8* 10.4* 10.0* 10.3* 10.8*  HCT 33.9* 33.2* 33.0* 32.6* 34.6*  MCV 89.7 90.7 92.7 91.1 90.8  PLT 338 305 286 309 331   Blood Culture    Component Value Date/Time   SDES BLOOD LEFT HAND 12/21/2020 1223   SPECREQUEST  12/21/2020 1223    BOTTLES DRAWN AEROBIC AND ANAEROBIC Blood Culture adequate volume   CULT  12/21/2020 1223    NO GROWTH 3 DAYS Performed at [facility], [place] [address] [place], [place] [address]    REPTSTATUS PENDING 12/21/2020 1223   Medications:  sodium  thiosulfate infusion for calciphylaxis 25 g (12/24/20 1657)    (feeding supplement) PROSource Plus  30 mL Oral BID BM   amiodarone  400 mg Oral BID   apixaban  5 mg Oral BID   atorvastatin  40 mg Oral Daily   Chlorhexidine Gluconate Cloth  6 each Topical Q0600   cholestyramine light  4 g Oral Q12H   clopidogrel  75 mg Oral Daily   darbepoetin (ARANESP) injection - DIALYSIS  60 mcg Intravenous Q Wed-HD   digoxin  0.0625 mg Oral QODAY   docusate sodium  100 mg Oral BID   fentaNYL  1 patch Transdermal Q72H   insulin aspart  0-9 Units Subcutaneous Q4H   insulin aspart  8 Units Subcutaneous TID WC   insulin glargine  15 Units Subcutaneous QHS   levothyroxine  12.5 mcg Oral QAC breakfast   lidocaine   Topical TID   metoprolol succinate  12.5 mg Oral Daily   midodrine  10 mg Oral TID WC   multivitamin  1 tablet Oral QHS   polyethylene glycol  17 g Oral Daily   pregabalin  25 mg Oral Daily   senna-docusate  1 tablet Oral BID   sevelamer carbonate  1,600 mg  Oral TID WC    Dialysis Orders: MWF at [facility] 4hr, 450/800, EDW 117kg, 2K/3Ca, TDC, heparin 4900 bolus + 500/hr   Assessment/Plan: 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 Punch Bx performed - c/w infection, but finished course of Vanc/Cefepime without improvement. Imaging without abscess. BCx 6/14 negative. ID consulted - felt c/w calciphylaxis -> start Na Thiosulfate q HD. Ca/Vit D products stopped. 2. ESRD: Continue HD per usual MWF schedule - next due on 6/20. 3. HTN/volume: BP low/stable (on mido) and metop for A-flutter. Had been running into issues with ^ UFG triggering A-flutter, did ok yesterday with net 2.5L off. 4. Anemia of ESRD: Hgb 10.8 - stable on Aranesp q Wednesday. 5. Secondary hyperparathyroidism: CorrCa/Phos ok. On Renvela as binder. Using 2Ca bath with HD. Last PTH 67 on 6/9 (low). 6. Nutrition: Alb low - continue protein supplements. 7. T2DM: Per primary. 8. A-flutter:  Amiodarone increased and digoxin started, per EP team. On Eliquis with plan for cardioversion in future. 9. 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 10. CAD/Hx CABG 11. PAD/multiple toe amputations  Veneta Penton, PA-C 12/25/2020, 11:07 AM  [facility]

## 2020-12-26 DIAGNOSIS — N186 End stage renal disease: Secondary | ICD-10-CM | POA: Diagnosis not present

## 2020-12-26 DIAGNOSIS — E1151 Type 2 diabetes mellitus with diabetic peripheral angiopathy without gangrene: Secondary | ICD-10-CM | POA: Diagnosis not present

## 2020-12-26 DIAGNOSIS — I5032 Chronic diastolic (congestive) heart failure: Secondary | ICD-10-CM | POA: Diagnosis not present

## 2020-12-26 DIAGNOSIS — L03115 Cellulitis of right lower limb: Secondary | ICD-10-CM | POA: Diagnosis not present

## 2020-12-26 LAB — CULTURE, BLOOD (ROUTINE X 2)
Culture: NO GROWTH
Culture: NO GROWTH
Special Requests: ADEQUATE
Special Requests: ADEQUATE

## 2020-12-26 LAB — CBC WITH DIFFERENTIAL/PLATELET
Abs Immature Granulocytes: 0.07 10*3/uL (ref 0.00–0.07)
Basophils Absolute: 0.1 10*3/uL (ref 0.0–0.1)
Basophils Relative: 1 %
Eosinophils Absolute: 0.2 10*3/uL (ref 0.0–0.5)
Eosinophils Relative: 2 %
HCT: 36.7 % — ABNORMAL LOW (ref 39.0–52.0)
Hemoglobin: 11 g/dL — ABNORMAL LOW (ref 13.0–17.0)
Immature Granulocytes: 1 %
Lymphocytes Relative: 9 %
Lymphs Abs: 1.3 10*3/uL (ref 0.7–4.0)
MCH: 28.1 pg (ref 26.0–34.0)
MCHC: 30 g/dL (ref 30.0–36.0)
MCV: 93.6 fL (ref 80.0–100.0)
Monocytes Absolute: 2.2 10*3/uL — ABNORMAL HIGH (ref 0.1–1.0)
Monocytes Relative: 16 %
Neutro Abs: 10.3 10*3/uL — ABNORMAL HIGH (ref 1.7–7.7)
Neutrophils Relative %: 71 %
Platelets: 312 10*3/uL (ref 150–400)
RBC: 3.92 MIL/uL — ABNORMAL LOW (ref 4.22–5.81)
RDW: 17.9 % — ABNORMAL HIGH (ref 11.5–15.5)
WBC: 14.2 10*3/uL — ABNORMAL HIGH (ref 4.0–10.5)
nRBC: 0 % (ref 0.0–0.2)

## 2020-12-26 LAB — COMPREHENSIVE METABOLIC PANEL
ALT: 9 U/L (ref 0–44)
AST: 36 U/L (ref 15–41)
Albumin: 2.4 g/dL — ABNORMAL LOW (ref 3.5–5.0)
Alkaline Phosphatase: 136 U/L — ABNORMAL HIGH (ref 38–126)
Anion gap: 20 — ABNORMAL HIGH (ref 5–15)
BUN: 29 mg/dL — ABNORMAL HIGH (ref 8–23)
CO2: 24 mmol/L (ref 22–32)
Calcium: 9.4 mg/dL (ref 8.9–10.3)
Chloride: 97 mmol/L — ABNORMAL LOW (ref 98–111)
Creatinine, Ser: 4.96 mg/dL — ABNORMAL HIGH (ref 0.61–1.24)
GFR, Estimated: 12 mL/min — ABNORMAL LOW (ref >60)
Glucose, Bld: 133 mg/dL — ABNORMAL HIGH (ref 70–99)
Potassium: 4.4 mmol/L (ref 3.5–5.1)
Sodium: 141 mmol/L (ref 135–145)
Total Bilirubin: 0.8 mg/dL (ref 0.3–1.2)
Total Protein: 6.9 g/dL (ref 6.5–8.1)

## 2020-12-26 LAB — PHOSPHORUS: Phosphorus: 4.2 mg/dL (ref 2.5–4.6)

## 2020-12-26 LAB — GLUCOSE, CAPILLARY
Glucose-Capillary: 125 mg/dL — ABNORMAL HIGH (ref 70–99)
Glucose-Capillary: 129 mg/dL — ABNORMAL HIGH (ref 70–99)
Glucose-Capillary: 94 mg/dL (ref 70–99)
Glucose-Capillary: 126 mg/dL — ABNORMAL HIGH (ref 70–99)
Glucose-Capillary: 62 mg/dL — ABNORMAL LOW (ref 70–99)
Glucose-Capillary: 69 mg/dL — ABNORMAL LOW (ref 70–99)
Glucose-Capillary: 142 mg/dL — ABNORMAL HIGH (ref 70–99)
Glucose-Capillary: 67 mg/dL — ABNORMAL LOW (ref 70–99)

## 2020-12-26 LAB — MAGNESIUM: Magnesium: 1.9 mg/dL (ref 1.7–2.4)

## 2020-12-26 MED ORDER — DEXTROSE 50 % IV SOLN
INTRAVENOUS | Status: AC
  Administered 2020-12-26: 50 mL
  Filled 2020-12-26: qty 50

## 2020-12-26 NOTE — Progress Notes (Signed)
PROGRESS NOTE    Kyle Walton  [id number] DOB: March 17, 1956 DOA: 12/03/2020 PCP: Patient, No Pcp Per (Inactive)     Brief Narrative:  65 year old WM PMHx  CAD/CABG, diastolic CHF, PAF/flutter status post AICD, DM-2 with PVD s/p several toe amputations, ESRD on HD M/W/F, , OSA and debility   Presenting with right leg pain and swelling and admitted for sepsis due to right lower extremity cellulitis, and 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  He has been started on sodium thiosulfate with HD.   Patient has bilateral lower extremity weakness, right greater than left.  Had extensive work-up including MRI C/T/L spine which did not reveal cord compression.  MRI pelvis showed diffuse edema, right greater than left.  Neurology recommended outpatient follow-up for EMG/nerve conduction study if no improvement in 3 weeks, and signed off.   Cardiology following for PAF/a flutter/prolonged QTc.  Amiodarone increased and digoxin added.  Plan for cardioversion down the road either inpatient or outpatient.   Therapy recommended SNF but better insurance authorization pending.     Subjective: 6/19 afebrile overnight, patient sleepy difficult to arouse.  Wife at bedside.   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 fib/flutter/prolonged QT-QTc  -Seems to be exaggerated by wide QRS.   -CHA2DS2-VASc score > 4. -Cardiology following. -On Eliquis for anticoagulation. -Plan DCCV in 2 weeks -Cardiology has signed off  History of PAD -s/p multiple toe amputations bilaterally, stable -Continue Plavix and statin   RIGHT lateral thigh calciphylaxis -Evaluated by ID, noninfectious  -LE [name] negative for DVT.   -CT of right femur suggestive for soft tissue edema and skin thickening likely due to fluid overload versus cellulitis.  Skin biopsy shows diffuse neutrophilic infiltrate without clear etiology.  PAS staining is negative for fungal hyphae.  With worsening erythema: imaging unremarkable for underlying abscess, DVT negative -Started on sodium thiosulfate with HD per nephrology. -Continue low-dose oxycodone and Lyrica cautiously -Patient rates pain currently at 10/10, unfortunately given patient's ESRD increasing oxycodone, adding morphine, indicated.  -6/16 fentanyl patch 25 mcg.  Monitor closely for oversedation.. -6/19 consult nutrition: Wife would like information on a diet to help control calciphylaxis.   Sepsis due to RLE cellulitis- -Sepsis physiology resolved except for leukocytosis.  Most likely secondary to patient's calciphylaxis -Completed antibiotic course with vancomycin and cefepime.    Acute toxic encephalopathy,  -Resolved: -5/29 received Narcan with improvement in his mentation. 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 -Continue Synthroid   Lower extremity weakness/acute on chronic ambulatory dysfunction:  -Right >>> LEFT; appears to be secondary to pain and not a mechanical/impingement issue  -MRI C, T and L-spine without acute cord compression.   -MRI pelvis with diffuse edema in the right pelvis more than left.  Neurology signed off.   -Therapy recommended SNF.  Bed offer and insurance authorization pending. -Outpatient neurology follow-up for EMG/nerve conduction study if symptoms persist for > 3 weeks.  OSA:  -Continue CPAP nightly   Legally blind   Leukocytosis:  Somewhat persistent.  Not febrile.  Not on a steroid.  At this point, unclear source. -Improving   Morbid Obesity: Body mass index is 46.82 kg/m.   Pressure skin injury: Pressure Injury 12/11/20 Sacrum Right;Left;Mid Stage 2 -  Partial thickness loss of dermis presenting as a shallow open injury with a red, pink wound bed without slough. (Active)  12/11/20 0551  Location: Sacrum  Location Orientation: Right;Left;Mid  Staging: Stage 2 -  Partial thickness loss of dermis presenting as a shallow open injury with a red, pink wound bed without slough.  Wound Description (Comments):   Present on Admission:       DVT prophylaxis: Eliquis Code Status: Full Family Communication:  Status is: Inpatient    Dispo: The patient is from: Home              Anticipated d/c is to: SNF              Anticipated d/c date is: 6/19 wife at bedside for discussion of plan of care all questions answered.              Patient currently is medically stable to d/c.      Consultants:  Nephrology  Procedures/Significant Events:    I have personally reviewed and interpreted all radiology studies and my findings are as above.  VENTILATOR SETTINGS:    Cultures   Antimicrobials:    Devices    LINES / TUBES:       Continuous Infusions:  sodium thiosulfate infusion for calciphylaxis 25 g (12/24/20 1657)     Objective: Vitals:   12/25/20 1950 12/26/20 0549 12/26/20 0832 12/26/20 1823  BP: 131/80 119/77 122/60 124/63  Pulse: 91 86 96 81  Resp: 19   18  Temp: (!) 97.4 F (36.3 C) 97.6 F (36.4 C)  98.4 F (36.9 C)  TempSrc: Oral Oral  Oral  SpO2: (!) 89% 92%  91%  Weight:      Height:        Intake/Output Summary (Last 24 hours) at 12/26/2020 1936 Last data filed at 12/26/2020 0600 Gross per 24 hour  Intake 480 ml  Output 0 ml  Net 480 ml    Filed Weights   12/24/20 0828 12/24/20 1254 12/25/20 0648  Weight: 120.9 kg 118.4 kg 118.4 kg    Examination:  General: A/O x4, No acute respiratory distress Eyes: negative scleral hemorrhage, negative anisocoria, negative icterus ENT: Negative Runny nose, negative gingival bleeding, Neck:  Negative scars, masses, torticollis, lymphadenopathy, JVD Lungs: Clear to auscultation bilaterally without wheezes or crackles Cardiovascular: Regular rate and rhythm without murmur gallop or rub normal S1 and  S2 Abdomen: Obese, negative abdominal pain, nondistended, positive soft, bowel sounds, no rebound, no ascites, no appreciable mass Extremities: No significant cyanosis, clubbing, or edema bilateral lower extremities Skin: RIGHT lateral thigh area consistent with calciphylaxis (power out unable to obtain good look) Psychiatric:  Negative depression, negative anxiety, negative fatigue, negative mania  Central nervous system:  Cranial nerves II through XII intact, tongue/uvula midline, all extremities muscle strength 5/5, sensation intact throughout, negative dysarthria, negative expressive aphasia, negative receptive aphasia.  .     Data Reviewed: Care during the described time interval was provided by me .  I have reviewed this patient's available data, including medical history, events of note, physical examination, and all test results as  part of my evaluation.  CBC: Recent Labs  Lab 12/20/20 1209 12/22/20 0454 12/23/20 0213 12/24/20 0347 12/25/20 0430 12/26/20 0244  WBC 15.3* 18.2* 13.8* 12.7* 15.3* 14.2*  NEUTROABS 12.5*  --  11.3* 10.0* 12.1* 10.3*  HGB 10.8* 10.4* 10.0* 10.3* 10.8* 11.0*  HCT 33.9* 33.2* 33.0* 32.6* 34.6* 36.7*  MCV 89.7 90.7 92.7 91.1 90.8 93.6  PLT 338 305 286 309 [id number]    Basic Metabolic Panel: Recent Labs  Lab 12/20/20 1209 12/22/20 0454 12/23/20 0213 12/24/20 0347 12/25/20 0430 12/26/20 0244  NA  --  139  --  137 141 141  K  --  4.0  --  4.2 4.1 4.4  CL  --  96*  --  97* 97* 97*  CO2  --  25  --  '27 25 24  '$ GLUCOSE  --  128*  --  193* 160* 133*  BUN  --  37*  --  32* 21 29*  CREATININE  --  5.70*  --  5.34* 3.94* 4.96*  CALCIUM  --  9.1  --  8.7* 9.1 9.4  MG 2.0  --  1.9 1.7 2.0 1.9  PHOS  --   --   --  3.7 2.6 4.2    GFR: Estimated Creatinine Clearance: 19.2 mL/min (A) (by C-G formula based on SCr of 4.96 mg/dL (H)). Liver Function Tests: Recent Labs  Lab 12/24/20 0347 12/25/20 0430 12/26/20 0244  AST 32 37 36  ALT '9 9 9  '$ ALKPHOS 126 149* 136*  BILITOT 1.2 1.0 0.8  PROT 6.1* 6.6 6.9  ALBUMIN 2.2* 2.3* 2.4*    No results for input(s): LIPASE, AMYLASE in the last 168 hours. No results for input(s): AMMONIA in the last 168 hours. Coagulation Profile: No results for input(s): INR, PROTIME in the last 168 hours. Cardiac Enzymes: No results for input(s): CKTOTAL, CKMB, CKMBINDEX, TROPONINI in the last 168 hours. BNP (last 3 results) No results for input(s): PROBNP in the last 8760 hours. HbA1C: No results for input(s): HGBA1C in the last 72 hours. CBG: Recent Labs  Lab 12/25/20 2332 12/26/20 0449 12/26/20 0636 12/26/20 1224 12/26/20 1620  GLUCAP 129* 125* 129* 94 126*    Lipid Profile: Recent Labs    12/24/20 0347  CHOL 69  HDL 16*  LDLCALC 31  TRIG 108  CHOLHDL 4.3    Thyroid Function Tests: No results for input(s): TSH, T4TOTAL, FREET4,  T3FREE, THYROIDAB in the last 72 hours. Anemia Panel: No results for input(s): VITAMINB12, FOLATE, FERRITIN, TIBC, IRON, RETICCTPCT in the last 72 hours. Sepsis Labs: No results for input(s): PROCALCITON, LATICACIDVEN in the last 168 hours.  Recent Results (from the past 240 hour(s))  Resp Panel by RT-PCR (Flu A&B, Covid) Nasopharyngeal Swab     Status:  None   Collection Time: 12/20/20  8:06 AM   Specimen: Nasopharyngeal Swab; Nasopharyngeal(NP) swabs in vial transport medium  Result Value Ref Range Status   SARS Coronavirus 2 by RT PCR NEGATIVE NEGATIVE Final    Comment: (NOTE) SARS-CoV-2 target nucleic acids are NOT DETECTED.  The SARS-CoV-2 RNA is generally detectable in upper respiratory specimens during the acute phase of infection. The lowest concentration of SARS-CoV-2 viral copies this assay can detect is 138 copies/mL. A negative result does not preclude SARS-Cov-2 infection and should not be used as the sole basis for treatment or other patient management decisions. A negative result may occur with  improper specimen collection/handling, submission of specimen other than nasopharyngeal swab, presence of viral mutation(s) within the areas targeted by this assay, and inadequate number of viral copies(<138 copies/mL). A negative result must be combined with clinical observations, patient history, and epidemiological information. The expected result is Negative.  Fact Sheet for Patients:  EntrepreneurPulse.com.au  Fact Sheet for Healthcare Providers:  IncredibleEmployment.be  This test is no t yet approved or cleared by the [place] FDA and  has been authorized for detection and/or diagnosis of SARS-CoV-2 by FDA under an Emergency Use Authorization (EUA). This EUA will remain  in effect (meaning this test can be used) for the duration of the COVID-19 declaration under Section 564(b)(1) of the Act, 21 U.S.C.section 360bbb-3(b)(1),  unless the authorization is terminated  or revoked sooner.       Influenza A by PCR NEGATIVE NEGATIVE Final   Influenza B by PCR NEGATIVE NEGATIVE Final    Comment: (NOTE) The Xpert Xpress SARS-CoV-2/FLU/RSV plus assay is intended as an aid in the diagnosis of influenza from Nasopharyngeal swab specimens and should not be used as a sole basis for treatment. Nasal washings and aspirates are unacceptable for Xpert Xpress SARS-CoV-2/FLU/RSV testing.  Fact Sheet for Patients: EntrepreneurPulse.com.au  Fact Sheet for Healthcare Providers: IncredibleEmployment.be  This test is not yet approved or cleared by the [place] FDA and has been authorized for detection and/or diagnosis of SARS-CoV-2 by FDA under an Emergency Use Authorization (EUA). This EUA will remain in effect (meaning this test can be used) for the duration of the COVID-19 declaration under Section 564(b)(1) of the Act, 21 U.S.C. section 360bbb-3(b)(1), unless the authorization is terminated or revoked.  Performed at [facility], [place] [address], [place], [place] [address]   Culture, blood (routine x 2)     Status: None   Collection Time: 12/21/20 12:18 PM   Specimen: BLOOD  Result Value Ref Range Status   Specimen Description BLOOD RIGHT ANTECUBITAL  Final   Special Requests   Final    BOTTLES DRAWN AEROBIC AND ANAEROBIC Blood Culture adequate volume   Culture   Final    NO GROWTH 5 DAYS Performed at [facility], [place] [address], [place], [place] [address]    Report Status 12/26/2020 FINAL  Final  Culture, blood (routine x 2)     Status: None   Collection Time: 12/21/20 12:23 PM   Specimen: BLOOD LEFT HAND  Result Value Ref Range Status   Specimen Description BLOOD LEFT HAND  Final   Special Requests   Final    BOTTLES DRAWN AEROBIC AND ANAEROBIC Blood Culture adequate volume   Culture   Final    NO GROWTH 5 DAYS Performed at [facility], [place] [address] [place], [place] [address]    Report Status 12/26/2020 FINAL  Final  Radiology Studies: No results found.      Scheduled Meds:  (feeding supplement) PROSource Plus  30 mL Oral BID BM   amiodarone  400 mg Oral BID   apixaban  5 mg Oral BID   atorvastatin  40 mg Oral Daily   Chlorhexidine Gluconate Cloth  6 each Topical Q0600   cholestyramine light  4 g Oral Q12H   clopidogrel  75 mg Oral Daily   darbepoetin (ARANESP) injection - DIALYSIS  60 mcg Intravenous Q Wed-HD   digoxin  0.0625 mg Oral QODAY   docusate sodium  100 mg Oral BID   fentaNYL  1 patch Transdermal Q72H   insulin aspart  0-9 Units Subcutaneous Q4H   insulin aspart  8 Units Subcutaneous TID WC   insulin glargine  15 Units Subcutaneous QHS   levothyroxine  12.5 mcg Oral QAC breakfast   lidocaine   Topical TID   metoprolol succinate  12.5 mg Oral Daily   midodrine  10 mg Oral TID WC   multivitamin  1 tablet Oral QHS   polyethylene glycol  17 g Oral Daily   pregabalin  25 mg Oral Daily   senna-docusate  1 tablet Oral BID   sevelamer carbonate  1,600 mg Oral TID WC   Continuous Infusions:  sodium thiosulfate infusion for calciphylaxis 25 g (12/24/20 1657)     LOS: 23 days    Time spent:40 min    Diamonds Lippard, Geraldo Docker, MD [facility]   If 7PM-7AM, please contact night-coverage 12/26/2020, 7:36 PM

## 2020-12-26 NOTE — Progress Notes (Signed)
[facility] Progress Note   Subjective:  Seen in room. No overnight CP or dyspnea. Thigh is more painful this morning.  Objective Vitals:   12/25/20 1726 12/25/20 1950 12/26/20 0549 12/26/20 0832  BP: 131/82 131/80 119/77 122/60  Pulse: 93 91 86 96  Resp: 19 19    Temp: 98.1 F (36.7 C) (!) 97.4 F (36.3 C) 97.6 F (36.4 C)   TempSrc:  Oral Oral   SpO2: 93% (!) 89% 92%   Weight:      Height:       Physical Exam General: Chronically ill appearing man. NAD. Nasal O2 in place. Heart: RRR; no murmur Lungs: CTA anteriorly, no wheezing or rales Abdomen: distended; no rash or tenderness Extremities: No LLE rash, trace edema. R thigh with erythema and pitting edema, ?very small scabbed/eschar area. Dialysis Access: [facility]   Additional Objective Labs: Basic Metabolic Panel: Recent Labs  Lab 12/24/20 0347 12/25/20 0430 12/26/20 0244  NA 137 141 141  K 4.2 4.1 4.4  CL 97* 97* 97*  CO2 '27 25 24  '$ GLUCOSE 193* 160* 133*  BUN 32* 21 29*  CREATININE 5.34* 3.94* 4.96*  CALCIUM 8.7* 9.1 9.4  PHOS 3.7 2.6 4.2   Liver Function Tests: Recent Labs  Lab 12/24/20 0347 12/25/20 0430 12/26/20 0244  AST 32 37 36  ALT '9 9 9  '$ ALKPHOS 126 149* 136*  BILITOT 1.2 1.0 0.8  PROT 6.1* 6.6 6.9  ALBUMIN 2.2* 2.3* 2.4*   CBC: Recent Labs  Lab 12/22/20 0454 12/23/20 0213 12/24/20 0347 12/25/20 0430 12/26/20 0244  WBC 18.2* 13.8* 12.7* 15.3* 14.2*  NEUTROABS  --  11.3* 10.0* 12.1* 10.3*  HGB 10.4* 10.0* 10.3* 10.8* 11.0*  HCT 33.2* 33.0* 32.6* 34.6* 36.7*  MCV 90.7 92.7 91.1 90.8 93.6  PLT 305 286 309 331 312   Medications:  sodium thiosulfate infusion for calciphylaxis 25 g (12/24/20 1657)    (feeding supplement) PROSource Plus  30 mL Oral BID BM   amiodarone  400 mg Oral BID   apixaban  5 mg Oral BID   atorvastatin  40 mg Oral Daily   Chlorhexidine Gluconate Cloth  6 each Topical Q0600   cholestyramine light  4 g Oral Q12H   clopidogrel  75 mg Oral Daily    darbepoetin (ARANESP) injection - DIALYSIS  60 mcg Intravenous Q Wed-HD   digoxin  0.0625 mg Oral QODAY   docusate sodium  100 mg Oral BID   fentaNYL  1 patch Transdermal Q72H   insulin aspart  0-9 Units Subcutaneous Q4H   insulin aspart  8 Units Subcutaneous TID WC   insulin glargine  15 Units Subcutaneous QHS   levothyroxine  12.5 mcg Oral QAC breakfast   lidocaine   Topical TID   metoprolol succinate  12.5 mg Oral Daily   midodrine  10 mg Oral TID WC   multivitamin  1 tablet Oral QHS   polyethylene glycol  17 g Oral Daily   pregabalin  25 mg Oral Daily   senna-docusate  1 tablet Oral BID   sevelamer carbonate  1,600 mg Oral TID WC    Dialysis Orders: MWF at [facility] 4hr, 450/800, EDW 117kg, 2K/3Ca, TDC, heparin 4900 bolus + 500/hr   Assessment/Plan: 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 Punch Bx performed - c/w infection, but finished course of Vanc/Cefepime without improvement. Imaging without abscess. BCx 6/14 negative. ID consulted - felt c/w calciphylaxis -> start Na Thiosulfate q HD.  Ca/Vit D products stopped. 2. ESRD: Continue HD per usual MWF schedule - next due on 6/20. 3. HTN/volume: BP low/stable (on mido) and metop for A-flutter. Had been running into issues with ^ UFG triggering A-flutter, did better with last HD. 4. Anemia of ESRD: Hgb 11 - on Aranesp q Wednesday, hold if rises further.. 5. Secondary hyperparathyroidism: CorrCa high today, Phos ok. On Renvela as binder. Using 2Ca bath with HD. Last PTH 67 on 6/9 (low). 6. Nutrition: Alb low - continue protein supplements. 7. T2DM: Per primary. 8. A-flutter: Amiodarone increased and digoxin started, per EP team. On Eliquis with plan for cardioversion in future. 9. 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 10. CAD/Hx CABG 11. PAD/multiple toe amputations  Veneta Penton, PA-C 12/26/2020, 10:29 AM  [facility]

## 2020-12-27 DIAGNOSIS — N186 End stage renal disease: Secondary | ICD-10-CM | POA: Diagnosis not present

## 2020-12-27 DIAGNOSIS — E1151 Type 2 diabetes mellitus with diabetic peripheral angiopathy without gangrene: Secondary | ICD-10-CM | POA: Diagnosis not present

## 2020-12-27 DIAGNOSIS — L03115 Cellulitis of right lower limb: Secondary | ICD-10-CM | POA: Diagnosis not present

## 2020-12-27 DIAGNOSIS — I5032 Chronic diastolic (congestive) heart failure: Secondary | ICD-10-CM | POA: Diagnosis not present

## 2020-12-27 LAB — COMPREHENSIVE METABOLIC PANEL
ALT: 12 U/L (ref 0–44)
AST: 55 U/L — ABNORMAL HIGH (ref 15–41)
Albumin: 2.4 g/dL — ABNORMAL LOW (ref 3.5–5.0)
Alkaline Phosphatase: 228 U/L — ABNORMAL HIGH (ref 38–126)
Anion gap: 19 — ABNORMAL HIGH (ref 5–15)
BUN: 40 mg/dL — ABNORMAL HIGH (ref 8–23)
CO2: 22 mmol/L (ref 22–32)
Calcium: 8.9 mg/dL (ref 8.9–10.3)
Chloride: 99 mmol/L (ref 98–111)
Creatinine, Ser: 6.07 mg/dL — ABNORMAL HIGH (ref 0.61–1.24)
GFR, Estimated: 10 mL/min — ABNORMAL LOW (ref >60)
Glucose, Bld: 148 mg/dL — ABNORMAL HIGH (ref 70–99)
Potassium: 4.5 mmol/L (ref 3.5–5.1)
Sodium: 140 mmol/L (ref 135–145)
Total Bilirubin: 1.1 mg/dL (ref 0.3–1.2)
Total Protein: 6.8 g/dL (ref 6.5–8.1)

## 2020-12-27 LAB — CBC WITH DIFFERENTIAL/PLATELET
Abs Immature Granulocytes: 0.07 10*3/uL (ref 0.00–0.07)
Basophils Absolute: 0.1 10*3/uL (ref 0.0–0.1)
Basophils Relative: 1 %
Eosinophils Absolute: 0.2 10*3/uL (ref 0.0–0.5)
Eosinophils Relative: 1 %
HCT: 33.6 % — ABNORMAL LOW (ref 39.0–52.0)
Hemoglobin: 10.2 g/dL — ABNORMAL LOW (ref 13.0–17.0)
Immature Granulocytes: 1 %
Lymphocytes Relative: 8 %
Lymphs Abs: 1.2 10*3/uL (ref 0.7–4.0)
MCH: 28.1 pg (ref 26.0–34.0)
MCHC: 30.4 g/dL (ref 30.0–36.0)
MCV: 92.6 fL (ref 80.0–100.0)
Monocytes Absolute: 1.5 10*3/uL — ABNORMAL HIGH (ref 0.1–1.0)
Monocytes Relative: 10 %
Neutro Abs: 11.6 10*3/uL — ABNORMAL HIGH (ref 1.7–7.7)
Neutrophils Relative %: 79 %
Platelets: 317 10*3/uL (ref 150–400)
RBC: 3.63 MIL/uL — ABNORMAL LOW (ref 4.22–5.81)
RDW: 17.4 % — ABNORMAL HIGH (ref 11.5–15.5)
WBC: 14.6 10*3/uL — ABNORMAL HIGH (ref 4.0–10.5)
nRBC: 0 % (ref 0.0–0.2)

## 2020-12-27 LAB — GLUCOSE, CAPILLARY
Glucose-Capillary: 151 mg/dL — ABNORMAL HIGH (ref 70–99)
Glucose-Capillary: 154 mg/dL — ABNORMAL HIGH (ref 70–99)
Glucose-Capillary: 172 mg/dL — ABNORMAL HIGH (ref 70–99)
Glucose-Capillary: 160 mg/dL — ABNORMAL HIGH (ref 70–99)
Glucose-Capillary: 233 mg/dL — ABNORMAL HIGH (ref 70–99)

## 2020-12-27 LAB — PHOSPHORUS: Phosphorus: 5.2 mg/dL — ABNORMAL HIGH (ref 2.5–4.6)

## 2020-12-27 LAB — MAGNESIUM: Magnesium: 1.9 mg/dL (ref 1.7–2.4)

## 2020-12-27 LAB — TSH: TSH: 8.606 u[IU]/mL — ABNORMAL HIGH (ref 0.350–4.500)

## 2020-12-27 MED ORDER — HEPARIN SODIUM (PORCINE) 1000 UNIT/ML IJ SOLN
INTRAMUSCULAR | Status: AC
  Filled 2020-12-27: qty 3

## 2020-12-27 MED ORDER — NEPRO/CARBSTEADY PO LIQD
237.0000 mL | Freq: Two times a day (BID) | ORAL | Status: DC
  Administered 2020-12-27 – 2020-12-31 (×6): 237 mL via ORAL

## 2020-12-27 NOTE — Progress Notes (Signed)
[facility] Progress Note   Subjective:     Patient was seen and examined today at bedside. Reports pain at R thigh. Denies SOB, CP, N/V/D. Plan for HD today.  Objective Vitals:   12/26/20 2151 12/27/20 0510 12/27/20 0513 12/27/20 1011  BP: 122/77  (!) 113/53 (!) 116/57  Pulse: (!) 102 81 77 100  Resp: '16 20  19  '$ Temp: 97.9 F (36.6 C) 97.6 F (36.4 C)  97.8 F (36.6 C)  TempSrc: Oral Oral  Oral  SpO2: 93% 91%  90%  Weight:    123 kg  Height:       Physical Exam General: Ill-appearing; No acute respiratory distress Heart: Normal S1 and S2; No murmurs, gallops, or rubs Lungs: Clear throughout; No wheezing, rales, or rhonchi Abdomen: Soft, non-tender, active bowel sounds Extremities: No edema bilateral lower extremities, dry dressing in place on R thigh Dialysis Access: [facility]   Filed Weights   12/24/20 1254 12/25/20 0648 12/27/20 1011  Weight: 118.4 kg 118.4 kg 123 kg    Intake/Output Summary (Last 24 hours) at 12/27/2020 1326 Last data filed at 12/27/2020 1100 Gross per 24 hour  Intake 540 ml  Output 0 ml  Net 540 ml    Additional Objective Labs: Basic Metabolic Panel: Recent Labs  Lab 12/25/20 0430 12/26/20 0244 12/27/20 0352  NA 141 141 140  K 4.1 4.4 4.5  CL 97* 97* 99  CO2 '25 24 22  '$ GLUCOSE 160* 133* 148*  BUN 21 29* 40*  CREATININE 3.94* 4.96* 6.07*  CALCIUM 9.1 9.4 8.9  PHOS 2.6 4.2 5.2*   Liver Function Tests: Recent Labs  Lab 12/25/20 0430 12/26/20 0244 12/27/20 0352  AST 37 36 55*  ALT '9 9 12  '$ ALKPHOS 149* 136* 228*  BILITOT 1.0 0.8 1.1  PROT 6.6 6.9 6.8  ALBUMIN 2.3* 2.4* 2.4*   No results for input(s): LIPASE, AMYLASE in the last 168 hours. CBC: Recent Labs  Lab 12/23/20 0213 12/24/20 0347 12/25/20 0430 12/26/20 0244 12/27/20 0352  WBC 13.8* 12.7* 15.3* 14.2* 14.6*  NEUTROABS 11.3* 10.0* 12.1* 10.3* 11.6*  HGB 10.0* 10.3* 10.8* 11.0* 10.2*  HCT 33.0* 32.6* 34.6* 36.7* 33.6*  MCV 92.7 91.1 90.8 93.6 92.6   PLT 286 309 331 312 317   Blood Culture    Component Value Date/Time   SDES BLOOD LEFT HAND 12/21/2020 1223   SPECREQUEST  12/21/2020 1223    BOTTLES DRAWN AEROBIC AND ANAEROBIC Blood Culture adequate volume   CULT  12/21/2020 1223    NO GROWTH 5 DAYS Performed at [facility], [place] [address] [place],  [address]    REPTSTATUS 12/26/2020 FINAL 12/21/2020 1223    Cardiac Enzymes: No results for input(s): CKTOTAL, CKMB, CKMBINDEX, TROPONINI in the last 168 hours. CBG: Recent Labs  Lab 12/26/20 2242 12/26/20 2323 12/27/20 0504 12/27/20 0814 12/27/20 1159  GLUCAP 67* 142* 151* 154* 172*   Iron Studies: No results for input(s): IRON, TIBC, TRANSFERRIN, FERRITIN in the last 72 hours. Lab Results  Component Value Date   INR 1.5 (H) 12/04/2020   INR 1.6 (H) 12/03/2020   INR 1.1 01/17/2020   Studies/Results: No results found.  Medications:  sodium thiosulfate infusion for calciphylaxis 25 g (12/24/20 1657)    (feeding supplement) PROSource Plus  30 mL Oral BID BM   amiodarone  400 mg Oral BID   apixaban  5 mg Oral BID   atorvastatin  40 mg Oral Daily   Chlorhexidine Gluconate Cloth  6 each Topical Q0600   cholestyramine light  4 g Oral Q12H   clopidogrel  75 mg Oral Daily   darbepoetin (ARANESP) injection - DIALYSIS  60 mcg Intravenous Q Wed-HD   digoxin  0.0625 mg Oral QODAY   docusate sodium  100 mg Oral BID   fentaNYL  1 patch Transdermal Q72H   heparin sodium (porcine)       insulin aspart  0-9 Units Subcutaneous Q4H   insulin aspart  8 Units Subcutaneous TID WC   insulin glargine  15 Units Subcutaneous QHS   levothyroxine  12.5 mcg Oral QAC breakfast   lidocaine   Topical TID   metoprolol succinate  12.5 mg Oral Daily   midodrine  10 mg Oral TID WC   multivitamin  1 tablet Oral QHS   polyethylene glycol  17 g Oral Daily   pregabalin  25 mg Oral Daily   senna-docusate  1 tablet Oral BID   sevelamer carbonate  1,600 mg Oral TID WC    Dialysis  Orders: MWF at [facility] 4hr, 450/800, EDW 117kg, 2K/3Ca, TDC, heparin 4900 bolus + 500/hr  Assessment/Plan: 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 Punch Bx performed - c/w infection, but finished course of Vanc/Cefepime without improvement. Imaging without abscess. BCx 6/14 negative. ID consulted - felt c/w calciphylaxis -> start Na Thiosulfate q HD. Ca/Vit D products stopped. 2. ESRD: Continue HD per usual MWF schedule - next HD today 6/20. 3. HTN/volume: BP low/stable (on mido) and metop for A-flutter. Had been running into issues with ^ UFG triggering A-flutter, did better with last HD. 4. Anemia of ESRD: Hgb 10.2 - on Aranesp q Wednesday, hold if rises further.. 5. Secondary hyperparathyroidism: CorrCa high today, Phos ok. On Renvela as binder. Using 2Ca bath with HD. Last PTH 67 on 6/9 (low). 6. Nutrition: Alb low - continue protein supplements. 7. T2DM: Per primary. 8. A-flutter: Amiodarone increased and digoxin started, per EP team. On Eliquis with plan for cardioversion in future. 9. 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 10. CAD/Hx CABG 11. PAD/multiple toe amputations  Kyle Poet, NP [facility] 12/27/2020,1:26 PM  LOS: 24 days

## 2020-12-27 NOTE — Progress Notes (Signed)
Inpatient Diabetes Program Recommendations  AACE/ADA: New Consensus Statement on Inpatient Glycemic Control (2015)  Target Ranges:  Prepandial:   less than 140 mg/dL      Peak postprandial:   less than 180 mg/dL (1-2 hours)      Critically ill patients:  140 - 180 mg/dL   Lab Results  Component Value Date   GLUCAP 154 (H) 12/27/2020   HGBA1C 9.7 (H) 12/04/2020    Review of Glycemic Control Results for SARIM, PROPER (MRN [id number]) as of 12/27/2020 11:34  Ref. Range 12/26/2020 21:46 12/26/2020 22:42 12/26/2020 23:23 12/27/2020 05:04 12/27/2020 08:14  Glucose-Capillary Latest Ref Range: 70 - 99 mg/dL 69 (L) 67 (L) 142 (H) 151 (H) 154 (H)   Diabetes history: Type 2 DM Outpatient Diabetes medications: Humalog 2-12 units TID, Basaglar 40 units QHS Current orders for Inpatient glycemic control: Lantus 15 units QHS, Novolog 8 units TID, Novolog 0-9 units Q4H  Inpatient Diabetes Program Recommendations:   Noted hypoglycemia yesterday following correction and meal coverage. With diet order, consider changing correction to Novolog 0-6 units TID & HS and reducing meal coverage to Novolog 6 units TID (assuming patient is consuming >50% of meal).   Thanks, Bronson Curb, MSN, RNC-OB Diabetes Coordinator [phone] (8a-5p)

## 2020-12-27 NOTE — Progress Notes (Signed)
   12/27/20 0012  Provider Notification  Provider Name/Title Dr. Mitzi Hansen  Date Provider Notified 12/27/20  Time Provider Notified [phone]  Notification Type Page  Notification Reason Change in status  Provider response See new orders   Multiple low blood sugars.  Dr. Myna Hidalgo aware.  Per Dr. Myna Hidalgo, ok to hold HS Lantus.  Will continue to monitor patient.  Earleen Reaper RN

## 2020-12-27 NOTE — Plan of Care (Signed)
  Problem: Clinical Measurements: Goal: Will remain free from infection Outcome: Progressing Goal: Diagnostic test results will improve Outcome: Progressing   Problem: Coping: Goal: Level of anxiety will decrease Outcome: Progressing   

## 2020-12-27 NOTE — Progress Notes (Signed)
Patient already on CPAP at this time.

## 2020-12-27 NOTE — Progress Notes (Signed)
Renal Navigator continues to follow along for disposition plan, as patient dialyzes in [place] at [facility] (MWF 11:45am) and plan for him is SNF.   Alphonzo Cruise, Guadalupe Guerra Renal Navigator [phone]

## 2020-12-27 NOTE — Progress Notes (Signed)
Patient resting comfortably on CPAP.  

## 2020-12-27 NOTE — Care Management Important Message (Signed)
Important Message  Patient Details  Name: Kyle Walton MRN: [id number] Date of Birth: [birth year]   Medicare Important Message Given:  Yes - Important Message mailed due to current National Emergency  Verbal consent obtained due to current National Emergency  Relationship to patient: Self Contact Name: Cuinn Call Date: 12/27/20  Time: 1502 Phone: [phone] Outcome: No Answer/Busy Important Message mailed to: Patient address on file    [name] 12/27/2020, 3:02 PM

## 2020-12-27 NOTE — Progress Notes (Signed)
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  He has been started on sodium thiosulfate with HD.   Patient has bilateral lower extremity weakness, right greater than left.  Had extensive work-up including MRI C/T/L spine which did not reveal cord compression.  MRI pelvis showed diffuse edema, right greater than left.  Neurology recommended outpatient follow-up for EMG/nerve conduction study if no improvement in 3 weeks, and signed off.   Cardiology following for PAF/a flutter/prolonged QTc.  Amiodarone increased and digoxin added.  Plan for cardioversion down the road either inpatient or outpatient.   Therapy recommended SNF but better insurance authorization pending.     Subjective: 6/20 afebrile overnight, A/O x4, patient states his major goals of care is to be able to ambulate again, transfer to his [place], and outside scooter.  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 fib/flutter/prolonged QT-QTc  -Seems to be exaggerated by wide QRS.   -CHA2DS2-VASc score > 4. -Cardiology following. -On Eliquis for anticoagulation. -Plan DCCV in 2 weeks -Cardiology has signed off  History of PAD -s/p multiple toe amputations bilaterally, stable -Continue Plavix and statin   RIGHT lateral thigh calciphylaxis -Evaluated by ID, noninfectious  -LE [name] negative for DVT.   -CT of right femur suggestive for soft tissue edema and skin thickening likely due to fluid overload versus cellulitis.  Skin biopsy shows diffuse neutrophilic infiltrate without clear etiology.  PAS staining is negative for fungal hyphae.  With worsening erythema: imaging unremarkable for underlying abscess, DVT negative -Started on sodium thiosulfate with HD per nephrology. -Continue low-dose oxycodone and Lyrica cautiously -Patient rates pain currently at 10/10, unfortunately given patient's ESRD increasing oxycodone, adding morphine, indicated.  -6/16 fentanyl patch 25 mcg.  Monitor closely for oversedation.. -6/19 consult nutrition: Wife would like information on a diet to help control calciphylaxis.   Sepsis due to RLE cellulitis- -Sepsis physiology resolved except for leukocytosis.  Most likely secondary  to patient's calciphylaxis -Completed antibiotic course with vancomycin and cefepime.    Acute toxic encephalopathy,  -Resolved: -5/29 received Narcan with improvement in his mentation. -Minimize opiates or other sedating medications   ESRD on HD M/W/F:  -Dialysis  per nephrology -6/17 ready for discharge from nephrology standpoint   DM type II uncontrolled with hyperglycemia -5/28 hemoglobin A1c= 9.7 - Lantus 15 units daily -6/17 NovoLog 8 units qac -6/17 sensitive SSI   Hypothyroidism:  -Check TSH. -Continue Synthroid   Lower extremity weakness/acute on chronic ambulatory dysfunction:  -Right >>> LEFT; appears to be secondary to pain and not a mechanical/impingement issue  -MRI C, T and L-spine without acute cord compression.   -MRI pelvis with diffuse edema in the right pelvis more than left.  Neurology signed off.   -Therapy recommended SNF.  Bed offer and insurance authorization pending. -Outpatient neurology follow-up for EMG/nerve conduction study if symptoms persist for > 3 weeks.  OSA:  -Continue CPAP nightly   Legally blind   Leukocytosis:  Somewhat persistent.  Not febrile.  Not on a steroid.  At this point, unclear source. -Improving   Morbid Obesity: Body mass index is 46.82 kg/m.   Pressure skin injury: Pressure Injury 12/11/20 Sacrum Right;Left;Mid Stage 2 -  Partial thickness loss of dermis presenting as a shallow open injury with a red, pink wound bed without slough. (Active)  12/11/20 0551  Location: Sacrum  Location Orientation: Right;Left;Mid  Staging: Stage 2 -  Partial thickness loss of dermis presenting as a shallow open injury with a red, pink wound bed without slough.  Wound Description (Comments):   Present on Admission:       DVT prophylaxis: Eliquis Code Status: Full Family Communication:  Status is: Inpatient    Dispo: The patient is from: Home              Anticipated d/c is to: SNF              Anticipated d/c date is: 6/19 wife at bedside for discussion of plan of care all questions answered.              Patient currently is medically stable to d/c.      Consultants:  Nephrology  Procedures/Significant Events:    I have personally reviewed and interpreted all radiology studies  and my findings are as above.  VENTILATOR SETTINGS:    Cultures   Antimicrobials: Anti-infectives (From admission, onward)    Start     Dose/Rate Route Frequency Ordered Stop   12/08/20 1007  vancomycin (VANCOCIN) 1-5 GM/200ML-% IVPB       Note to Pharmacy: Judieth Keens  : cabinet override      12/08/20 1007 12/08/20 1102   12/06/20 1203  vancomycin (VANCOCIN) 1-5 GM/200ML-% IVPB       Note to Pharmacy: Wallace Cullens   : cabinet override      12/06/20 1203 12/06/20 1206   12/04/20 1030  vancomycin (VANCOREADY) IVPB 1000 mg/200 mL        1,000 mg 200 mL/hr over 60 Minutes Intravenous  Once 12/04/20 0930 12/04/20 1230   12/03/20 2300  vancomycin (VANCOREADY) IVPB 1000 mg/200 mL        1,000 mg 200 mL/hr over 60 Minutes Intravenous Every M-W-F (Hemodialysis) 12/03/20 2019 12/09/20 2359   12/03/20 1830  ceFEPIme (MAXIPIME) 1 g in sodium chloride 0.9 % 100 mL IVPB  Status:  Discontinued        1 g 200 mL/hr over 30  Minutes Intravenous Every 24 hours 12/03/20 1802 12/05/20 1630   12/03/20 1800  vancomycin (VANCOREADY) IVPB 1000 mg/200 mL  Status:  Discontinued        1,000 mg 200 mL/hr over 60 Minutes Intravenous  Once 12/03/20 1756 12/03/20 1801   12/03/20 1300  vancomycin (VANCOREADY) IVPB 1750 mg/350 mL        1,750 mg 175 mL/hr over 120 Minutes Intravenous  Once 12/03/20 1252 12/03/20 1706   12/03/20 1300  cefTRIAXone (ROCEPHIN) 2 g in sodium chloride 0.9 % 100 mL IVPB        2 g 200 mL/hr over 30 Minutes Intravenous  Once 12/03/20 1252 12/03/20 1345         Devices    LINES / TUBES:      Continuous Infusions:  sodium thiosulfate infusion for calciphylaxis 25 g (12/24/20 1657)     Objective: Vitals:   12/26/20 2151 12/27/20 0510 12/27/20 0513 12/27/20 1011  BP: 122/77  (!) 113/53 (!) 116/57  Pulse: (!) 102 81 77 100  Resp: '16 20  19  '$ Temp: 97.9 F (36.6 C) 97.6 F (36.4 C)  97.8 F (36.6 C)  TempSrc: Oral Oral  Oral  SpO2: 93% 91%  90%   Weight:    123 kg  Height:        Intake/Output Summary (Last 24 hours) at 12/27/2020 1526 Last data filed at 12/27/2020 1100 Gross per 24 hour  Intake 540 ml  Output 0 ml  Net 540 ml    Filed Weights   12/24/20 1254 12/25/20 0648 12/27/20 1011  Weight: 118.4 kg 118.4 kg 123 kg    Examination:  General: A/O x4, No acute respiratory distress Eyes: negative scleral hemorrhage, negative anisocoria, negative icterus ENT: Negative Runny nose, negative gingival bleeding, Neck:  Negative scars, masses, torticollis, lymphadenopathy, JVD Lungs: Clear to auscultation bilaterally without wheezes or crackles Cardiovascular: Regular rate and rhythm without murmur gallop or rub normal S1 and S2 Abdomen: Obese, negative abdominal pain, nondistended, positive soft, bowel sounds, no rebound, no ascites, no appreciable mass Extremities: No significant cyanosis, clubbing, or edema bilateral lower extremities Skin: RIGHT lateral thigh area consistent with calciphylaxis (power out unable to obtain good look) Psychiatric:  Negative depression, negative anxiety, negative fatigue, negative mania  Central nervous system:  Cranial nerves II through XII intact, tongue/uvula midline, all extremities muscle strength 5/5, sensation intact throughout, negative dysarthria, negative expressive aphasia, negative receptive aphasia.  .     Data Reviewed: Care during the described time interval was provided by me .  I have reviewed this patient's available data, including medical history, events of note, physical examination, and all test results as part of my evaluation.  CBC: Recent Labs  Lab 12/23/20 0213 12/24/20 0347 12/25/20 0430 12/26/20 0244 12/27/20 0352  WBC 13.8* 12.7* 15.3* 14.2* 14.6*  NEUTROABS 11.3* 10.0* 12.1* 10.3* 11.6*  HGB 10.0* 10.3* 10.8* 11.0* 10.2*  HCT 33.0* 32.6* 34.6* 36.7* 33.6*  MCV 92.7 91.1 90.8 93.6 92.6  PLT 286 309 331 312 [id number]    Basic Metabolic Panel: Recent Labs   Lab 12/22/20 0454 12/23/20 0213 12/24/20 0347 12/25/20 0430 12/26/20 0244 12/27/20 0352  NA 139  --  137 141 141 140  K 4.0  --  4.2 4.1 4.4 4.5  CL 96*  --  97* 97* 97* 99  CO2 25  --  '27 25 24 22  '$ GLUCOSE 128*  --  193* 160* 133* 148*  BUN 37*  --  32*  21 29* 40*  CREATININE 5.70*  --  5.34* 3.94* 4.96* 6.07*  CALCIUM 9.1  --  8.7* 9.1 9.4 8.9  MG  --  1.9 1.7 2.0 1.9 1.9  PHOS  --   --  3.7 2.6 4.2 5.2*    GFR: Estimated Creatinine Clearance: 16 mL/min (A) (by C-G formula based on SCr of 6.07 mg/dL (H)). Liver Function Tests: Recent Labs  Lab 12/24/20 0347 12/25/20 0430 12/26/20 0244 12/27/20 0352  AST 32 37 36 55*  ALT '9 9 9 12  '$ ALKPHOS 126 149* 136* 228*  BILITOT 1.2 1.0 0.8 1.1  PROT 6.1* 6.6 6.9 6.8  ALBUMIN 2.2* 2.3* 2.4* 2.4*    No results for input(s): LIPASE, AMYLASE in the last 168 hours. No results for input(s): AMMONIA in the last 168 hours. Coagulation Profile: No results for input(s): INR, PROTIME in the last 168 hours. Cardiac Enzymes: No results for input(s): CKTOTAL, CKMB, CKMBINDEX, TROPONINI in the last 168 hours. BNP (last 3 results) No results for input(s): PROBNP in the last 8760 hours. HbA1C: No results for input(s): HGBA1C in the last 72 hours. CBG: Recent Labs  Lab 12/26/20 2242 12/26/20 2323 12/27/20 0504 12/27/20 0814 12/27/20 1159  GLUCAP 67* 142* 151* 154* 172*    Lipid Profile: No results for input(s): CHOL, HDL, LDLCALC, TRIG, CHOLHDL, LDLDIRECT in the last 72 hours.  Thyroid Function Tests: Recent Labs    12/27/20 0352  TSH 8.606*   Anemia Panel: No results for input(s): VITAMINB12, FOLATE, FERRITIN, TIBC, IRON, RETICCTPCT in the last 72 hours. Sepsis Labs: No results for input(s): PROCALCITON, LATICACIDVEN in the last 168 hours.  Recent Results (from the past 240 hour(s))  Resp Panel by RT-PCR (Flu A&B, Covid) Nasopharyngeal Swab     Status: None   Collection Time: 12/20/20  8:06 AM   Specimen:  Nasopharyngeal Swab; Nasopharyngeal(NP) swabs in vial transport medium  Result Value Ref Range Status   SARS Coronavirus 2 by RT PCR NEGATIVE NEGATIVE Final    Comment: (NOTE) SARS-CoV-2 target nucleic acids are NOT DETECTED.  The SARS-CoV-2 RNA is generally detectable in upper respiratory specimens during the acute phase of infection. The lowest concentration of SARS-CoV-2 viral copies this assay can detect is 138 copies/mL. A negative result does not preclude SARS-Cov-2 infection and should not be used as the sole basis for treatment or other patient management decisions. A negative result may occur with  improper specimen collection/handling, submission of specimen other than nasopharyngeal swab, presence of viral mutation(s) within the areas targeted by this assay, and inadequate number of viral copies(<138 copies/mL). A negative result must be combined with clinical observations, patient history, and epidemiological information. The expected result is Negative.  Fact Sheet for Patients:  EntrepreneurPulse.com.au  Fact Sheet for Healthcare Providers:  IncredibleEmployment.be  This test is no t yet approved or cleared by the [place] FDA and  has been authorized for detection and/or diagnosis of SARS-CoV-2 by FDA under an Emergency Use Authorization (EUA). This EUA will remain  in effect (meaning this test can be used) for the duration of the COVID-19 declaration under Section 564(b)(1) of the Act, 21 U.S.C.section 360bbb-3(b)(1), unless the authorization is terminated  or revoked sooner.       Influenza A by PCR NEGATIVE NEGATIVE Final   Influenza B by PCR NEGATIVE NEGATIVE Final    Comment: (NOTE) The Xpert Xpress SARS-CoV-2/FLU/RSV plus assay is intended as an aid in the diagnosis of influenza from Nasopharyngeal swab specimens and should not  be used as a sole basis for treatment. Nasal washings and aspirates are unacceptable for  Xpert Xpress SARS-CoV-2/FLU/RSV testing.  Fact Sheet for Patients: EntrepreneurPulse.com.au  Fact Sheet for Healthcare Providers: IncredibleEmployment.be  This test is not yet approved or cleared by the [place] FDA and has been authorized for detection and/or diagnosis of SARS-CoV-2 by FDA under an Emergency Use Authorization (EUA). This EUA will remain in effect (meaning this test can be used) for the duration of the COVID-19 declaration under Section 564(b)(1) of the Act, 21 U.S.C. section 360bbb-3(b)(1), unless the authorization is terminated or revoked.  Performed at [facility], [place] [address] [place], [place] [address]   Culture, blood (routine x 2)     Status: None   Collection Time: 12/21/20 12:18 PM   Specimen: BLOOD  Result Value Ref Range Status   Specimen Description BLOOD RIGHT ANTECUBITAL  Final   Special Requests   Final    BOTTLES DRAWN AEROBIC AND ANAEROBIC Blood Culture adequate volume   Culture   Final    NO GROWTH 5 DAYS Performed at [facility], [place] [address], [place], [place] [address]    Report Status 12/26/2020 FINAL  Final  Culture, blood (routine x 2)     Status: None   Collection Time: 12/21/20 12:23 PM   Specimen: BLOOD LEFT HAND  Result Value Ref Range Status   Specimen Description BLOOD LEFT HAND  Final   Special Requests   Final    BOTTLES DRAWN AEROBIC AND ANAEROBIC Blood Culture adequate volume   Culture   Final    NO GROWTH 5 DAYS Performed at [facility], [place] [address] [place], [place] [address]    Report Status 12/26/2020 FINAL  Final          Radiology Studies: No results found.      Scheduled Meds:  (feeding supplement) PROSource Plus  30 mL Oral BID BM   amiodarone  400 mg Oral BID   apixaban  5 mg Oral BID   atorvastatin  40 mg Oral Daily   Chlorhexidine Gluconate Cloth  6 each Topical Q0600   cholestyramine light  4 g Oral Q12H   clopidogrel  75  mg Oral Daily   darbepoetin (ARANESP) injection - DIALYSIS  60 mcg Intravenous Q Wed-HD   digoxin  0.0625 mg Oral QODAY   docusate sodium  100 mg Oral BID   feeding supplement (NEPRO CARB STEADY)  237 mL Oral BID BM   fentaNYL  1 patch Transdermal Q72H   heparin sodium (porcine)       insulin aspart  0-9 Units Subcutaneous Q4H   insulin aspart  8 Units Subcutaneous TID WC   insulin glargine  15 Units Subcutaneous QHS   levothyroxine  12.5 mcg Oral QAC breakfast   lidocaine   Topical TID   metoprolol succinate  12.5 mg Oral Daily   midodrine  10 mg Oral TID WC   multivitamin  1 tablet Oral QHS   polyethylene glycol  17 g Oral Daily   pregabalin  25 mg Oral Daily   senna-docusate  1 tablet Oral BID   sevelamer carbonate  1,600 mg Oral TID WC   Continuous Infusions:  sodium thiosulfate infusion for calciphylaxis 25 g (12/24/20 1657)     LOS: 24 days    Time spent:40 min    Trimaine Maser, Geraldo Docker, MD [facility]   If 7PM-7AM, please contact night-coverage 12/27/2020, 3:26 PM

## 2020-12-27 NOTE — Progress Notes (Signed)
Initial Nutrition Assessment  DOCUMENTATION CODES:   Obesity unspecified  INTERVENTION:   - Continue ProSource Plus 30 ml po BID, each supplement provides 100 kcal and 15 grams of protein  - Nepro Shake po BID, each supplement provides 425 kcal and 19 grams protein  - Continue renal MVI daily  - Diet education provided to both pt and pt's spouse  - Recommend removing Carb Modified diet restriction from pt's diet order to allow more options, awaiting response from MD  NUTRITION DIAGNOSIS:   Increased nutrient needs related to chronic illness, wound healing as evidenced by estimated needs.  GOAL:   Patient will meet greater than or equal to 90% of their needs  MONITOR:   PO intake, Supplement acceptance, Labs, Weight trends, Skin, I & O's  REASON FOR ASSESSMENT:   Consult Diet education  ASSESSMENT:   65 year old male who presented to the ED on 5/27 with RLE cellulitis. PMH of T2DM, CAD s/p CABG x 3, PAD with multiple toe amputations, ESRD on HD, CHF, legal blindness., HTN. Pt admitted with sepsis.  6/02 - s/p R thigh punch biopsy  Pt with severe R thigh pain/calciphylaxis and prolonged hospital admission. RD consulted to discuss calciphylaxis diet with pt and spouse per spouse's request.  Pt with HD scheduled for today. Last HD was on 6/17 with 2501 ml net UF and post-HD weight of 118.4 kg. Pt's EDW is 117 kg per Nephrology note.  Spoke with pt at bedside. Pt reports appetite is okay but he does not like the food choices here. He reports that he ends up getting the same thing for most meals. Pt is focused on his protein intake and making sure that he gets enough. Discussed importance of monitoring and limiting phosphorus in diet as it relates to calciphylaxis. Also discussed importance of taking phosphorus binders.  Pt has been taking the ProSource Plus supplements and is willing to try Nepro shakes. RD to order. RD provided pt with a Nepro shake at time of  visit.  Pt's spouse was not present at time of RD visit and unable to reach her via phone during visit. RD was able to reach pt's spouse later in the day. Discussed the above diet-related information with pt's spouse. Pt's spouse with lots of questions which were  all answered to the best of RD's ability.  Meal Completion: 45-100% x last 8 documented meals  Medications reviewed and include: ProSource Plus BID, aranesp weekly, colace, SSI q 4 hours, novolog 8 units TID, lantus 15 units daily, rena-vit, miralax, senna, renvela TID with meals, sodium thiosulfate with HD  Labs reviewed: phosphorus CBG's: 67-172 x 24 hours  NUTRITION - FOCUSED PHYSICAL EXAM:  Flowsheet Row Most Recent Value  Orbital Region No depletion  Upper Arm Region No depletion  Thoracic and Lumbar Region No depletion  Buccal Region No depletion  Temple Region No depletion  Clavicle Bone Region Moderate depletion  Clavicle and Acromion Bone Region Moderate depletion  Scapular Bone Region Unable to assess  Dorsal Hand Moderate depletion  Patellar Region Moderate depletion  [only assessed L side]  Anterior Thigh Region Moderate depletion  [only assessed L side]  Posterior Calf Region Mild depletion  [only assessed L side]  Edema (RD Assessment) Mild  [BLE]  Hair Reviewed  Eyes Reviewed  Mouth Reviewed  Skin Reviewed  Nails Reviewed       Diet Order:   Diet Order             Diet renal/carb modified  with fluid restriction Diet-HS Snack? Nothing; Fluid restriction: 1200 mL Fluid; Room service appropriate? Yes with Assist; Fluid consistency: Thin  Diet effective now                   EDUCATION NEEDS:   Education needs have been addressed  Skin:  Skin Assessment: Skin Integrity Issues: Stage II: sacrum Other: calciphylaxis to RLE  Last BM:  12/26/20  Height:   Ht Readings from Last 1 Encounters:  12/12/20 '5\' 10"'$  (1.778 m)    Weight:   Wt Readings from Last 1 Encounters:  12/27/20 123 kg     BMI:  Body mass index is 38.91 kg/m.  Estimated Nutritional Needs:   Kcal:  2300-2500  Protein:  115-130 grams  Fluid:  1000 ml + UOP    Gustavus Bryant, MS, RD, LDN Inpatient Clinical Dietitian Please see AMiON for contact information.

## 2020-12-27 NOTE — Progress Notes (Signed)
CBG @ 2004 - 62  Patient given crackers and cranberry juice  CBG @ 2146 - 69  Patient given cranberry juice  CBG @ 2242 - 67  Amp D50 given  CBG @ 2323 - 151  Will continue to monitor patient.  Earleen Reaper RN

## 2020-12-28 LAB — CBC WITH DIFFERENTIAL/PLATELET
Abs Immature Granulocytes: 0.14 10*3/uL — ABNORMAL HIGH (ref 0.00–0.07)
Basophils Absolute: 0.1 10*3/uL (ref 0.0–0.1)
Basophils Relative: 1 %
Eosinophils Absolute: 0.2 10*3/uL (ref 0.0–0.5)
Eosinophils Relative: 2 %
HCT: 32.7 % — ABNORMAL LOW (ref 39.0–52.0)
Hemoglobin: 10 g/dL — ABNORMAL LOW (ref 13.0–17.0)
Immature Granulocytes: 1 %
Lymphocytes Relative: 8 %
Lymphs Abs: 1 10*3/uL (ref 0.7–4.0)
MCH: 27.9 pg (ref 26.0–34.0)
MCHC: 30.6 g/dL (ref 30.0–36.0)
MCV: 91.3 fL (ref 80.0–100.0)
Monocytes Absolute: 1.5 10*3/uL — ABNORMAL HIGH (ref 0.1–1.0)
Monocytes Relative: 12 %
Neutro Abs: 9.8 10*3/uL — ABNORMAL HIGH (ref 1.7–7.7)
Neutrophils Relative %: 76 %
Platelets: 276 10*3/uL (ref 150–400)
RBC: 3.58 MIL/uL — ABNORMAL LOW (ref 4.22–5.81)
RDW: 17.4 % — ABNORMAL HIGH (ref 11.5–15.5)
WBC: 12.7 10*3/uL — ABNORMAL HIGH (ref 4.0–10.5)
nRBC: 0 % (ref 0.0–0.2)

## 2020-12-28 LAB — COMPREHENSIVE METABOLIC PANEL
ALT: 12 U/L (ref 0–44)
AST: 52 U/L — ABNORMAL HIGH (ref 15–41)
Albumin: 2.3 g/dL — ABNORMAL LOW (ref 3.5–5.0)
Alkaline Phosphatase: 190 U/L — ABNORMAL HIGH (ref 38–126)
Anion gap: 20 — ABNORMAL HIGH (ref 5–15)
BUN: 23 mg/dL (ref 8–23)
CO2: 23 mmol/L (ref 22–32)
Calcium: 8.3 mg/dL — ABNORMAL LOW (ref 8.9–10.3)
Chloride: 96 mmol/L — ABNORMAL LOW (ref 98–111)
Creatinine, Ser: 4.17 mg/dL — ABNORMAL HIGH (ref 0.61–1.24)
GFR, Estimated: 15 mL/min — ABNORMAL LOW (ref >60)
Glucose, Bld: 115 mg/dL — ABNORMAL HIGH (ref 70–99)
Potassium: 3.7 mmol/L (ref 3.5–5.1)
Sodium: 139 mmol/L (ref 135–145)
Total Bilirubin: 1 mg/dL (ref 0.3–1.2)
Total Protein: 6.4 g/dL — ABNORMAL LOW (ref 6.5–8.1)

## 2020-12-28 LAB — GLUCOSE, CAPILLARY
Glucose-Capillary: 97 mg/dL (ref 70–99)
Glucose-Capillary: 126 mg/dL — ABNORMAL HIGH (ref 70–99)
Glucose-Capillary: 137 mg/dL — ABNORMAL HIGH (ref 70–99)
Glucose-Capillary: 86 mg/dL (ref 70–99)
Glucose-Capillary: 124 mg/dL — ABNORMAL HIGH (ref 70–99)

## 2020-12-28 LAB — PHOSPHORUS: Phosphorus: 3.6 mg/dL (ref 2.5–4.6)

## 2020-12-28 LAB — MAGNESIUM: Magnesium: 1.7 mg/dL (ref 1.7–2.4)

## 2020-12-28 MED ORDER — HEPARIN SODIUM (PORCINE) 1000 UNIT/ML IJ SOLN
INTRAMUSCULAR | Status: AC
  Filled 2020-12-28: qty 4

## 2020-12-28 MED ORDER — HEPARIN SODIUM (PORCINE) 1000 UNIT/ML DIALYSIS: 20.0000 [IU]/kg | INTRAMUSCULAR | Status: DC | PRN

## 2020-12-28 NOTE — Progress Notes (Signed)
[name] Progress Note   Subjective:     Patient was seen and examined today at bedside. Appears drowsy. Received HD overnight-tolerated UF 1.3L. Nurse tech informed me that he did not have his CPAP on last night. Denies pain, SOB, CP, N/V/D. Awaiting SNF placement-plan for HD tomorrow if patient is still here.  Objective Vitals:   12/28/20 0410 12/28/20 0419 12/28/20 0457 12/28/20 1041  BP:  (!) 90/57 101/62 (!) 99/45  Pulse: 85 86 97 98  Resp: '17 11 16 19  '$ Temp:  98.4 F (36.9 C) (!) 97.5 F (36.4 C)   TempSrc:  Oral Oral   SpO2: 95% 95% 93% 95%  Weight:  120.5 kg    Height:       Physical Exam General: Drowsy appearing; No acute respiratory distress Heart: Normal S1 and S2; No murmurs, gallops, or rubs Lungs: Clear throughout; No wheezing, rales, or rhonchi Abdomen: Soft, non-tender, active bowel sounds Extremities: Trace edema LLE, dry dressing in place on R thigh-CDI Dialysis Access: [facility]   Filed Weights   12/27/20 1011 12/27/20 2217 12/28/20 0419  Weight: 123 kg 122 kg 120.5 kg    Intake/Output Summary (Last 24 hours) at 12/28/2020 1115 Last data filed at 12/28/2020 0600 Gross per 24 hour  Intake 0 ml  Output 1317 ml  Net -1317 ml    Additional Objective Labs: Basic Metabolic Panel: Recent Labs  Lab 12/26/20 0244 12/27/20 0352 12/28/20 0723  NA 141 140 139  K 4.4 4.5 3.7  CL 97* 99 96*  CO2 '24 22 23  '$ GLUCOSE 133* 148* 115*  BUN 29* 40* 23  CREATININE 4.96* 6.07* 4.17*  CALCIUM 9.4 8.9 8.3*  PHOS 4.2 5.2* 3.6   Liver Function Tests: Recent Labs  Lab 12/26/20 0244 12/27/20 0352 12/28/20 0723  AST 36 55* 52*  ALT '9 12 12  '$ ALKPHOS 136* 228* 190*  BILITOT 0.8 1.1 1.0  PROT 6.9 6.8 6.4*  ALBUMIN 2.4* 2.4* 2.3*   No results for input(s): LIPASE, AMYLASE in the last 168 hours. CBC: Recent Labs  Lab 12/24/20 0347 12/25/20 0430 12/26/20 0244 12/27/20 0352 12/28/20 0723  WBC 12.7* 15.3* 14.2* 14.6* 12.7*  NEUTROABS 10.0*  12.1* 10.3* 11.6* 9.8*  HGB 10.3* 10.8* 11.0* 10.2* 10.0*  HCT 32.6* 34.6* 36.7* 33.6* 32.7*  MCV 91.1 90.8 93.6 92.6 91.3  PLT 309 331 312 317 276   Blood Culture    Component Value Date/Time   SDES BLOOD LEFT HAND 12/21/2020 1223   SPECREQUEST  12/21/2020 1223    BOTTLES DRAWN AEROBIC AND ANAEROBIC Blood Culture adequate volume   CULT  12/21/2020 1223    NO GROWTH 5 DAYS Performed at [facility], [place] [address], [place], [place] [address]    REPTSTATUS 12/26/2020 FINAL 12/21/2020 1223    Cardiac Enzymes: No results for input(s): CKTOTAL, CKMB, CKMBINDEX, TROPONINI in the last 168 hours. CBG: Recent Labs  Lab 12/27/20 1159 12/27/20 1743 12/27/20 2028 12/28/20 0458 12/28/20 0743  GLUCAP 172* 160* 233* 97 126*   Iron Studies: No results for input(s): IRON, TIBC, TRANSFERRIN, FERRITIN in the last 72 hours. Lab Results  Component Value Date   INR 1.5 (H) 12/04/2020   INR 1.6 (H) 12/03/2020   INR 1.1 01/17/2020   Studies/Results: No results found.  Medications:  sodium thiosulfate infusion for calciphylaxis 25 g (12/24/20 1657)    (feeding supplement) PROSource Plus  30 mL Oral BID BM   amiodarone  400 mg Oral BID  apixaban  5 mg Oral BID   atorvastatin  40 mg Oral Daily   Chlorhexidine Gluconate Cloth  6 each Topical Q0600   cholestyramine light  4 g Oral Q12H   clopidogrel  75 mg Oral Daily   darbepoetin (ARANESP) injection - DIALYSIS  60 mcg Intravenous Q Wed-HD   digoxin  0.0625 mg Oral QODAY   docusate sodium  100 mg Oral BID   feeding supplement (NEPRO CARB STEADY)  237 mL Oral BID BM   fentaNYL  1 patch Transdermal Q72H   heparin sodium (porcine)       insulin aspart  0-9 Units Subcutaneous Q4H   insulin aspart  8 Units Subcutaneous TID WC   insulin glargine  15 Units Subcutaneous QHS   levothyroxine  12.5 mcg Oral QAC breakfast   lidocaine   Topical TID   metoprolol succinate  12.5 mg Oral Daily   midodrine  10 mg Oral TID WC   multivitamin   1 tablet Oral QHS   polyethylene glycol  17 g Oral Daily   pregabalin  25 mg Oral Daily   senna-docusate  1 tablet Oral BID   sevelamer carbonate  1,600 mg Oral TID WC    Dialysis Orders: MWF at [facility] 4hr, 450/800, EDW 117kg, 2K/3Ca, TDC, heparin 4900 bolus + 500/hr  Assessment/Plan: 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 Punch Bx performed - c/w infection, but finished course of Vanc/Cefepime without improvement. Imaging without abscess. BCx 6/14 negative. ID consulted - felt c/w calciphylaxis -> start Na Thiosulfate q HD. Ca/Vit D products stopped. 2. ESRD: Continue HD per usual MWF schedule - tolerated HD overnight with UF 1.3L. Plan for HD 6/22 if patient is still here. 3. HTN/volume: BP low/stable (on mido) and metop for A-flutter. Had been running into issues with ^ UFG triggering A-flutter, denies palpitations during last HD-will monitor. 4. Anemia of ESRD: Hgb 10 - on Aranesp q Wednesday, hold if rises further.. 5. Secondary hyperparathyroidism: CorrCa high today, Phos ok. On Renvela as binder. Using 2Ca bath with HD. Last PTH 67 on 6/9 (low). 6. Nutrition: Alb low - continue protein supplements. 7. T2DM: Per primary. 8. A-flutter: Amiodarone increased and digoxin started, per EP team. On Eliquis with plan for cardioversion in future. 9. 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 10. CAD/Hx CABG 11. 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   Kyle Poet, NP [facility] 12/28/2020,11:15 AM  LOS: 25 days

## 2020-12-28 NOTE — Plan of Care (Signed)
  Problem: Health Behavior/Discharge Planning: Goal: Ability to manage health-related needs will improve Outcome: Progressing   Problem: Activity: Goal: Risk for activity intolerance will decrease Outcome: Progressing   Problem: Coping: Goal: Level of anxiety will decrease Outcome: Progressing   

## 2020-12-28 NOTE — TOC Progression Note (Signed)
Transition of Care [facility]) - Progression Note    Patient Details  Name: Kyle Walton MRN: [id number] Date of Birth: 20-Sep-1955  [facility] CM/SW Contact  Sharlet Salina Mila Homer, LCSW Phone Number: 12/28/2020, 4:01 PM  Clinical Narrative:  Contact made with Kyle Walton, admissions director at [facility] regarding patient and still needing rehab. Informed her that bed still needed and CSW and would work on getting transport to pick patient up at [facility] once insurance auth rec'd.  Call also made to [facility], admissions director at [facility] to determine if they have any male rehab bed availability, (because if they can accept patient, his dialysis won't have to be changed), and she will review patient's information and get back with CSW.  Nurse case manager and CSW will follow-up with patient tomorrow regarding ST rehab placement.     Expected Discharge Plan: Skilled Nursing Facility Barriers to Discharge: Continued Medical Work up  Expected Discharge Plan and Services Expected Discharge Plan: [facility] [place] [facility]         Expected Discharge Date: 12/20/20                                     Social Determinants of Health (SDOH) Interventions    Readmission Risk Interventions No flowsheet data found.

## 2020-12-28 NOTE — Progress Notes (Signed)
Pt. States he can place himself on cpap when ready. RT informed pt. To notify if he needed any assistance.

## 2020-12-28 NOTE — Plan of Care (Signed)
?  Problem: Clinical Measurements: ?Goal: Ability to maintain clinical measurements within normal limits will improve ?Outcome: Progressing ?Goal: Will remain free from infection ?Outcome: Progressing ?Goal: Diagnostic test results will improve ?Outcome: Progressing ?  ?

## 2020-12-28 NOTE — Progress Notes (Signed)
Physical Therapy Treatment Patient Details Name: Kyle Walton MRN: [id number] DOB: June 24, 1956 Today's Date: 12/28/2020    History of Present Illness Pt is a 65 y.o. male who presented 5/27 with R leg cellulitis and sepsis. PMH: PAF, OSA, MI, ESRD on HD MWF, DM on insulin, s/p multiple toe amputations, morbid obesity, ESBL/VRE/MRSA and CAD.    PT Comments    Pt very adamant about not going to SNF. Wants to get up and prove he can go home with his wife's help. Pt found to be incontinent of stool able to roll on side with supervision. Pt is min guard for coming to EoB. Attempted sit>stand with RW and HHA but only able to clear hips from bed not come to upright. Pt very frustrated. Pt able to sit EoB for 15 min and perform seated therex. Pt is min guard to return to sidelying however incontinent of stool with return to bed. After cleaned up placed bed in chair position and educated pt on LE and core exercises to work on and also to request that bed be placed in chair position for meals. PT continues to recommend SNF level rehab prior to d/c home. PT will continue to follow acutely.     Follow Up Recommendations  SNF     Equipment Recommendations  3in1 (PT);Wheelchair (measurements PT);Wheelchair cushion (measurements PT);Hospital bed;Other (comment) Metallurgist)       Precautions / Restrictions Precautions Precautions: Fall;Other (comment) Precaution Comments: contact precautions; Also noteworthy that lateral aspect of R thigh is very painful and sensitive to even light touch Restrictions Weight Bearing Restrictions: No Other Position/Activity Restrictions: h/o multiple toe amputations bilat    Mobility  Bed Mobility Overal bed mobility: Needs Assistance Bed Mobility: Rolling;Sidelying to Sit Rolling: Supervision (and rail) Sidelying to sit: Min guard     Sit to sidelying: Min guard General bed mobility comments: able to utilize bedrails to push up into sitting,     Transfers Overall transfer level: Needs assistance Equipment used: Rolling walker (2 wheeled);1 person hand held assist Transfers: Sit to/from Stand Sit to Stand: Total assist         General transfer comment: attempted to push up from bed to RW, attempted again with HHA, vc for looking up and leading with chest, on attempt pt looking down at feet and unable to straighten up once hips cleared bed, pt very frustrated and refuses additional attempts        Balance Overall balance assessment: Needs assistance Sitting-balance support: Feet supported Sitting balance-Leahy Scale: Fair Sitting balance - Comments: able to static sit and participate in seated exercise       Standing balance comment: unable today                            Cognition Arousal/Alertness: Awake/alert Behavior During Therapy: WFL for tasks assessed/performed;Anxious Overall Cognitive Status: Within Functional Limits for tasks assessed                                 General Comments: Pt very upset about recommendation for SNF, pt is sure that he can get up and his wife can help him      Exercises General Exercises - Lower Extremity Ankle Circles/Pumps: AROM;Both;10 reps;Seated Long Arc Quad: AROM;Both;10 reps;Seated Hip ABduction/ADduction: AROM;Both;10 reps;Seated Hip Flexion/Marching: AROM;Both;10 reps;Seated Other Exercises Other Exercises: crunch with 10 sec hold in chair position  General Comments General comments (skin integrity, edema, etc.): VSS on RA      Pertinent Vitals/Pain Pain Assessment: Faces Faces Pain Scale: Hurts even more Pain Location: R lateral leg with movement, R thigh with touch Pain Descriptors / Indicators: Grimacing;Guarding;Sharp Pain Intervention(s): Monitored during session;Repositioned     PT Goals (current goals can now be found in the care plan section) Acute Rehab PT Goals PT Goal Formulation: With patient Time For Goal  Achievement: 01/04/21 Potential to Achieve Goals: Good Progress towards PT goals: Progressing toward goals    Frequency    Min 2X/week      PT Plan Current plan remains appropriate       AM-PAC PT "6 Clicks" Mobility   Outcome Measure  Help needed turning from your back to your side while in a flat bed without using bedrails?: None Help needed moving from lying on your back to sitting on the side of a flat bed without using bedrails?: A Lot Help needed moving to and from a bed to a chair (including a wheelchair)?: A Lot Help needed standing up from a chair using your arms (e.g., wheelchair or bedside chair)?: A Lot Help needed to walk in hospital room?: Total Help needed climbing 3-5 steps with a railing? : Total 6 Click Score: 12    End of Session Equipment Utilized During Treatment: Gait belt Activity Tolerance: Patient tolerated treatment well;Other (comment) (R thigh pain is a precautoin) Patient left: in bed;with call bell/phone within reach;with bed alarm set Nurse Communication: Mobility status;Need for lift equipment PT Visit Diagnosis: Muscle weakness (generalized) (M62.81);Difficulty in walking, not elsewhere classified (R26.2);Pain Pain - Right/Left: Right Pain - part of body: Leg     Time: 1455-1537 PT Time Calculation (min) (ACUTE ONLY): 42 min  Charges:  $Therapeutic Exercise: 8-22 mins $Therapeutic Activity: 23-37 mins                     Gianfranco Araki B. Migdalia Dk PT, DPT Acute Rehabilitation Services Pager [phone] Office [phone]    [place] 12/28/2020, 3:56 PM

## 2020-12-28 NOTE — Progress Notes (Signed)
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  He has been started on sodium thiosulfate with HD.   Patient has bilateral lower extremity weakness, right greater than left.  Had extensive work-up including MRI C/T/L spine which did not reveal cord compression.  MRI pelvis showed diffuse edema, right greater than left.  Neurology recommended outpatient follow-up for EMG/nerve conduction study if no improvement in 3 weeks, and signed off.   Cardiology following for PAF/a flutter/prolonged QTc.  Amiodarone increased and digoxin added.  Plan for cardioversion down the road either inpatient or outpatient.   Therapy recommended SNF but better insurance authorization pending.     Subjective: 6/21 afebrile overnight, A/O x4 patient upset today states does not want to go to SNF if they are only willing to provide him 15 minutes/day PT.  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 ([place])   Chronic diastolic congestive heart failure, NYHA class 4 ([place])   ESRD on hemodialysis ([place])    Cellulitis of right leg   Sepsis ([place])   Pressure injury of skin     Chronic diastolic CHF:  -Strict in and out +1.6 L - Daily weight Filed Weights   12/27/20 1011 12/27/20 2217 12/28/20 0419  Weight: 123 kg 122 kg 120.5 kg   - HD for fluid management per nephrology -Amiodarone 400 mg BID -Digoxin 0.0625 mg QOD - Toprol 12.5 mg daily  Essential HTN - BP soft for a HD patient - Midodrine 10 mg TID (for hypotension)   CAD-s/p CABG 2017:  -See CHF No anginal symptoms  Paroxysmal A. fib/flutter/prolonged QT-QTc  -Seems to be exaggerated by wide QRS.   -CHA2DS2-VASc score > 4. -Cardiology following. -On Eliquis for anticoagulation. -Plan DCCV in 2 weeks -Cardiology has signed off  History of PAD -s/p multiple toe amputations bilaterally, stable -Continue Plavix and statin   RIGHT lateral thigh calciphylaxis -Evaluated by ID, noninfectious  -LE [name] negative for DVT.   -CT of right femur suggestive for soft tissue edema and skin thickening likely due to fluid overload versus cellulitis.  Skin biopsy shows diffuse neutrophilic infiltrate without clear etiology.  PAS staining is negative for fungal hyphae.  With worsening erythema: imaging unremarkable for underlying abscess, DVT negative -Started on sodium thiosulfate with HD per nephrology. -Continue low-dose oxycodone and Lyrica cautiously -Patient rates pain currently at 10/10, unfortunately given patient's ESRD increasing oxycodone, adding morphine, indicated.  -6/16 fentanyl patch 25 mcg.  Monitor closely for oversedation.. -6/19 consult nutrition: Wife would like information on a diet to help control calciphylaxis. -6/21  contacted LCSW Lorriane Shire and informed her of patient's concerns about SNF she agreed to speak with patient   Sepsis due to RLE cellulitis- -Sepsis physiology resolved except for leukocytosis.  Most likely secondary to patient's calciphylaxis -Completed antibiotic course with vancomycin and cefepime.     Acute toxic encephalopathy,  -Resolved: -5/29 received Narcan with improvement in his mentation. -Minimize opiates or other sedating medications   ESRD on HD M/W/F:  -Dialysis per nephrology -6/17 ready for discharge from nephrology standpoint   DM type II uncontrolled with hyperglycemia -5/28 hemoglobin A1c= 9.7 - Lantus 15 units daily -6/17 NovoLog 8 units qac -6/17 sensitive SSI   Hypothyroidism:  -Check TSH. -Continue Synthroid   Lower extremity weakness/acute on chronic ambulatory dysfunction:  -Right >>> LEFT; appears to be secondary to pain and not a mechanical/impingement issue  -MRI C, T and L-spine without acute cord compression.   -MRI pelvis with diffuse edema in the right pelvis more than left.  Neurology signed off.   -Therapy recommended SNF.  Bed offer and insurance authorization pending. -Outpatient neurology follow-up for EMG/nerve conduction study if symptoms persist for > 3 weeks. -6/21 PT/OT consult; patient will remain on unit for extended period of time ensure daily PT/OT sessions, to work on ROM and strengthening  OSA:  -Continue CPAP nightly   Legally blind   Leukocytosis:  Somewhat persistent.  Not febrile.  Not on a steroid.  At this point, unclear source. -Improving   Morbid Obesity: Body mass index is 46.82 kg/m.   Pressure skin injury: Pressure Injury 12/11/20 Sacrum Right;Left;Mid Stage 2 -  Partial thickness loss of dermis presenting as a shallow open injury with a red, pink wound bed without slough. (Active)  12/11/20 0551  Location: Sacrum  Location Orientation: Right;Left;Mid  Staging: Stage 2 -  Partial thickness loss of dermis presenting as a shallow open injury with a red, pink wound bed without slough.  Wound Description (Comments):   Present on Admission:       DVT prophylaxis: Eliquis Code Status: Full Family Communication: 6/19 wife at bedside for discussion of plan of care all questions answered. Status is:  Inpatient    Dispo: The patient is from: Home              Anticipated d/c is to: Home with home health or SNF              Anticipated d/c date is: Patient currently is medically stable to d/c.      Consultants:  Nephrology   Procedures/Significant Events:    I have personally reviewed and interpreted all radiology studies and my findings are as above.  VENTILATOR SETTINGS:    Cultures   Antimicrobials: Anti-infectives (From admission, onward)    Start     Ordered Stop   12/08/20 1007  vancomycin (VANCOCIN) 1-5 GM/200ML-% IVPB       Note to Pharmacy: Judieth Keens  : cabinet override   12/08/20 1007 12/08/20 1102   12/06/20 1203  vancomycin (VANCOCIN) 1-5 GM/200ML-% IVPB       Note to Pharmacy: Wallace Cullens   : cabinet override   12/06/20 1203 12/06/20 1206   12/04/20 1030  vancomycin (VANCOREADY) IVPB 1000 mg/200 mL        12/04/20 0930 12/04/20 1230   12/03/20 2300  vancomycin (VANCOREADY) IVPB 1000 mg/200 mL        12/03/20 2019 12/09/20 2359   12/03/20 1830  ceFEPIme (MAXIPIME) 1 g in sodium chloride 0.9 % 100  mL IVPB  Status:  Discontinued        12/03/20 1802 12/05/20 1630   12/03/20 1800  vancomycin (VANCOREADY) IVPB 1000 mg/200 mL  Status:  Discontinued        12/03/20 1756 12/03/20 1801   12/03/20 1300  vancomycin (VANCOREADY) IVPB 1750 mg/350 mL        12/03/20 1252 12/03/20 1706   12/03/20 1300  cefTRIAXone (ROCEPHIN) 2 g in sodium chloride 0.9 % 100 mL IVPB        12/03/20 1252 12/03/20 1345         Devices    LINES / TUBES:      Continuous Infusions:  sodium thiosulfate infusion for calciphylaxis 25 g (12/24/20 1657)     Objective: Vitals:   12/28/20 0410 12/28/20 0419 12/28/20 0457 12/28/20 1041  BP:  (!) 90/57 101/62 (!) 99/45  Pulse: 85 86 97 98  Resp: '17 11 16 19  '$ Temp:  98.4 F (36.9 C) (!) 97.5 F (36.4 C)   TempSrc:  Oral Oral   SpO2: 95% 95% 93% 95%  Weight:  120.5 kg    Height:        Intake/Output  Summary (Last 24 hours) at 12/28/2020 1402 Last data filed at 12/28/2020 1200 Gross per 24 hour  Intake 120 ml  Output 1317 ml  Net -1197 ml    Filed Weights   12/27/20 1011 12/27/20 2217 12/28/20 0419  Weight: 123 kg 122 kg 120.5 kg    Examination:  General: A/O x4, No acute respiratory distress Eyes: negative scleral hemorrhage, negative anisocoria, negative icterus ENT: Negative Runny nose, negative gingival bleeding, Neck:  Negative scars, masses, torticollis, lymphadenopathy, JVD Lungs: Clear to auscultation bilaterally without wheezes or crackles Cardiovascular: Regular rate and rhythm without murmur gallop or rub normal S1 and S2 Abdomen: Obese, negative abdominal pain, nondistended, positive soft, bowel sounds, no rebound, no ascites, no appreciable mass Extremities: No significant cyanosis, clubbing, or edema bilateral lower extremities Skin: RIGHT lateral thigh area consistent with calciphylaxis (power out unable to obtain good look) Psychiatric:  Negative depression, negative anxiety, negative fatigue, negative mania  Central nervous system:  Cranial nerves II through XII intact, tongue/uvula midline, all extremities muscle strength 5/5, sensation intact throughout, negative dysarthria, negative expressive aphasia, negative receptive aphasia.  .     Data Reviewed: Care during the described time interval was provided by me .  I have reviewed this patient's available data, including medical history, events of note, physical examination, and all test results as part of my evaluation.  CBC: Recent Labs  Lab 12/24/20 0347 12/25/20 0430 12/26/20 0244 12/27/20 0352 12/28/20 0723  WBC 12.7* 15.3* 14.2* 14.6* 12.7*  NEUTROABS 10.0* 12.1* 10.3* 11.6* 9.8*  HGB 10.3* 10.8* 11.0* 10.2* 10.0*  HCT 32.6* 34.6* 36.7* 33.6* 32.7*  MCV 91.1 90.8 93.6 92.6 91.3  PLT 309 331 312 317 [id number]    Basic Metabolic Panel: Recent Labs  Lab 12/24/20 0347 12/25/20 0430 12/26/20 0244  12/27/20 0352 12/28/20 0723  NA 137 141 141 140 139  K 4.2 4.1 4.4 4.5 3.7  CL 97* 97* 97* 99 96*  CO2 '27 25 24 22 23  '$ GLUCOSE 193* 160* 133* 148* 115*  BUN 32* 21 29* 40* 23  CREATININE 5.34* 3.94* 4.96* 6.07* 4.17*  CALCIUM 8.7* 9.1 9.4 8.9 8.3*  MG 1.7 2.0 1.9 1.9 1.7  PHOS 3.7 2.6 4.2 5.2* 3.6    GFR: Estimated Creatinine Clearance: 23 mL/min (A) (by C-G formula based  on SCr of 4.17 mg/dL (H)). Liver Function Tests: Recent Labs  Lab 12/24/20 0347 12/25/20 0430 12/26/20 0244 12/27/20 0352 12/28/20 0723  AST 32 37 36 55* 52*  ALT '9 9 9 12 12  '$ ALKPHOS 126 149* 136* 228* 190*  BILITOT 1.2 1.0 0.8 1.1 1.0  PROT 6.1* 6.6 6.9 6.8 6.4*  ALBUMIN 2.2* 2.3* 2.4* 2.4* 2.3*    No results for input(s): LIPASE, AMYLASE in the last 168 hours. No results for input(s): AMMONIA in the last 168 hours. Coagulation Profile: No results for input(s): INR, PROTIME in the last 168 hours. Cardiac Enzymes: No results for input(s): CKTOTAL, CKMB, CKMBINDEX, TROPONINI in the last 168 hours. BNP (last 3 results) No results for input(s): PROBNP in the last 8760 hours. HbA1C: No results for input(s): HGBA1C in the last 72 hours. CBG: Recent Labs  Lab 12/27/20 1743 12/27/20 2028 12/28/20 0458 12/28/20 0743 12/28/20 1230  GLUCAP 160* 233* 97 126* 137*    Lipid Profile: No results for input(s): CHOL, HDL, LDLCALC, TRIG, CHOLHDL, LDLDIRECT in the last 72 hours.  Thyroid Function Tests: Recent Labs    12/27/20 0352  TSH 8.606*    Anemia Panel: No results for input(s): VITAMINB12, FOLATE, FERRITIN, TIBC, IRON, RETICCTPCT in the last 72 hours. Sepsis Labs: No results for input(s): PROCALCITON, LATICACIDVEN in the last 168 hours.  Recent Results (from the past 240 hour(s))  Resp Panel by RT-PCR (Flu A&B, Covid) Nasopharyngeal Swab     Status: None   Collection Time: 12/20/20  8:06 AM   Specimen: Nasopharyngeal Swab; Nasopharyngeal(NP) swabs in vial transport medium  Result Value  Ref Range Status   SARS Coronavirus 2 by RT PCR NEGATIVE NEGATIVE Final    Comment: (NOTE) SARS-CoV-2 target nucleic acids are NOT DETECTED.  The SARS-CoV-2 RNA is generally detectable in upper respiratory specimens during the acute phase of infection. The lowest concentration of SARS-CoV-2 viral copies this assay can detect is 138 copies/mL. A negative result does not preclude SARS-Cov-2 infection and should not be used as the sole basis for treatment or other patient management decisions. A negative result may occur with  improper specimen collection/handling, submission of specimen other than nasopharyngeal swab, presence of viral mutation(s) within the areas targeted by this assay, and inadequate number of viral copies(<138 copies/mL). A negative result must be combined with clinical observations, patient history, and epidemiological information. The expected result is Negative.  Fact Sheet for Patients:  EntrepreneurPulse.com.au  Fact Sheet for Healthcare Providers:  IncredibleEmployment.be  This test is no t yet approved or cleared by the [place] FDA and  has been authorized for detection and/or diagnosis of SARS-CoV-2 by FDA under an Emergency Use Authorization (EUA). This EUA will remain  in effect (meaning this test can be used) for the duration of the COVID-19 declaration under Section 564(b)(1) of the Act, 21 U.S.C.section 360bbb-3(b)(1), unless the authorization is terminated  or revoked sooner.       Influenza A by PCR NEGATIVE NEGATIVE Final   Influenza B by PCR NEGATIVE NEGATIVE Final    Comment: (NOTE) The Xpert Xpress SARS-CoV-2/FLU/RSV plus assay is intended as an aid in the diagnosis of influenza from Nasopharyngeal swab specimens and should not be used as a sole basis for treatment. Nasal washings and aspirates are unacceptable for Xpert Xpress SARS-CoV-2/FLU/RSV testing.  Fact Sheet for  Patients: EntrepreneurPulse.com.au  Fact Sheet for Healthcare Providers: IncredibleEmployment.be  This test is not yet approved or cleared by the [place] FDA and has been  authorized for detection and/or diagnosis of SARS-CoV-2 by FDA under an Emergency Use Authorization (EUA). This EUA will remain in effect (meaning this test can be used) for the duration of the COVID-19 declaration under Section 564(b)(1) of the Act, 21 U.S.C. section 360bbb-3(b)(1), unless the authorization is terminated or revoked.  Performed at [facility], [place] [address], [place], [place] [address]   Culture, blood (routine x 2)     Status: None   Collection Time: 12/21/20 12:18 PM   Specimen: BLOOD  Result Value Ref Range Status   Specimen Description BLOOD RIGHT ANTECUBITAL  Final   Special Requests   Final    BOTTLES DRAWN AEROBIC AND ANAEROBIC Blood Culture adequate volume   Culture   Final    NO GROWTH 5 DAYS Performed at [facility], [place] [address] [place], [place] [address]    Report Status 12/26/2020 FINAL  Final  Culture, blood (routine x 2)     Status: None   Collection Time: 12/21/20 12:23 PM   Specimen: BLOOD LEFT HAND  Result Value Ref Range Status   Specimen Description BLOOD LEFT HAND  Final   Special Requests   Final    BOTTLES DRAWN AEROBIC AND ANAEROBIC Blood Culture adequate volume   Culture   Final    NO GROWTH 5 DAYS Performed at [facility], [place] [address] [place], [place] [address]    Report Status 12/26/2020 FINAL  Final          Radiology Studies: No results found.      Scheduled Meds:  (feeding supplement) PROSource Plus  30 mL Oral BID BM   amiodarone  400 mg Oral BID   apixaban  5 mg Oral BID   atorvastatin  40 mg Oral Daily   Chlorhexidine Gluconate Cloth  6 each Topical Q0600   cholestyramine light  4 g Oral Q12H   clopidogrel  75 mg Oral Daily   darbepoetin (ARANESP) injection - DIALYSIS   60 mcg Intravenous Q Wed-HD   digoxin  0.0625 mg Oral QODAY   docusate sodium  100 mg Oral BID   feeding supplement (NEPRO CARB STEADY)  237 mL Oral BID BM   fentaNYL  1 patch Transdermal Q72H   heparin sodium (porcine)       insulin aspart  0-9 Units Subcutaneous Q4H   insulin aspart  8 Units Subcutaneous TID WC   insulin glargine  15 Units Subcutaneous QHS   levothyroxine  12.5 mcg Oral QAC breakfast   lidocaine   Topical TID   metoprolol succinate  12.5 mg Oral Daily   midodrine  10 mg Oral TID WC   multivitamin  1 tablet Oral QHS   polyethylene glycol  17 g Oral Daily   pregabalin  25 mg Oral Daily   senna-docusate  1 tablet Oral BID   sevelamer carbonate  1,600 mg Oral TID WC   Continuous Infusions:  sodium thiosulfate infusion for calciphylaxis 25 g (12/24/20 1657)     LOS: 25 days    Time spent:40 min    Brody Bonneau, Geraldo Docker, MD [facility]   If 7PM-7AM, please contact night-coverage 12/28/2020, 2:02 PM

## 2020-12-29 LAB — GLUCOSE, CAPILLARY
Glucose-Capillary: 147 mg/dL — ABNORMAL HIGH (ref 70–99)
Glucose-Capillary: 141 mg/dL — ABNORMAL HIGH (ref 70–99)
Glucose-Capillary: 80 mg/dL (ref 70–99)
Glucose-Capillary: 119 mg/dL — ABNORMAL HIGH (ref 70–99)
Glucose-Capillary: 136 mg/dL — ABNORMAL HIGH (ref 70–99)

## 2020-12-29 LAB — CBC WITH DIFFERENTIAL/PLATELET
Abs Immature Granulocytes: 0.08 10*3/uL — ABNORMAL HIGH (ref 0.00–0.07)
Basophils Absolute: 0.1 10*3/uL (ref 0.0–0.1)
Basophils Relative: 1 %
Eosinophils Absolute: 0.2 10*3/uL (ref 0.0–0.5)
Eosinophils Relative: 1 %
HCT: 32.5 % — ABNORMAL LOW (ref 39.0–52.0)
Hemoglobin: 10.1 g/dL — ABNORMAL LOW (ref 13.0–17.0)
Immature Granulocytes: 1 %
Lymphocytes Relative: 8 %
Lymphs Abs: 1.1 10*3/uL (ref 0.7–4.0)
MCH: 28.5 pg (ref 26.0–34.0)
MCHC: 31.1 g/dL (ref 30.0–36.0)
MCV: 91.5 fL (ref 80.0–100.0)
Monocytes Absolute: 1.4 10*3/uL — ABNORMAL HIGH (ref 0.1–1.0)
Monocytes Relative: 11 %
Neutro Abs: 10.6 10*3/uL — ABNORMAL HIGH (ref 1.7–7.7)
Neutrophils Relative %: 78 %
Platelets: 271 10*3/uL (ref 150–400)
RBC: 3.55 MIL/uL — ABNORMAL LOW (ref 4.22–5.81)
RDW: 17.3 % — ABNORMAL HIGH (ref 11.5–15.5)
WBC: 13.4 10*3/uL — ABNORMAL HIGH (ref 4.0–10.5)
nRBC: 0 % (ref 0.0–0.2)

## 2020-12-29 LAB — SARS CORONAVIRUS 2 (TAT 6-24 HRS): SARS Coronavirus 2: NEGATIVE

## 2020-12-29 LAB — PHOSPHORUS: Phosphorus: 4.7 mg/dL — ABNORMAL HIGH (ref 2.5–4.6)

## 2020-12-29 LAB — MAGNESIUM: Magnesium: 1.8 mg/dL (ref 1.7–2.4)

## 2020-12-29 MED ORDER — HEPARIN SODIUM (PORCINE) 1000 UNIT/ML IJ SOLN
INTRAMUSCULAR | Status: AC
  Filled 2020-12-29: qty 4

## 2020-12-29 NOTE — TOC Progression Note (Addendum)
Transition of Care [facility]) - Progression Note    Patient Details  Name: Corrion Smisek MRN: [id number] Date of Birth: [birth year]  Transition of Care [facility]) CM/SW Contact  Sharlet Salina Mila Homer, LCSW Phone Number: 12/29/2020, 10:37 AM  Clinical Narrative:  CSW and nurse case manager Carles Collet visited with patient to talk with him about his SNF concerns. Mr. Pene was concerned about the time therapist will be with him per day/week and was advised that therapist at [facility]  work with patients a minimum of 5 days per week and the time spent with patients may vary, but is more than 5/10 minutes.  Mr. Birden was also informed [facility] in [facility] is reviewing his information and will let CSW know, and also [facility] can take him if [facility] is unable to accept him.    2:49 pm: Contacted [facility] and spoke with Maudie Mercury, admissions Mudlogger. They have no beds available in [facility] facility.   2:52 pm: Minus Breeding, admissions liaison at [facility] and they can take patient, pending his dialysis transportation being arranged to get him to [facility].  Submitted for insurance authorization with [facility] and received approval: Olene Floss ID [id number], effective 12/30/20 - 01/03/21. Next review date 01/03/21.  4:46 pm: Call made to [place] transportation department [phone]) and talked with Olivia Mackie regarding HD transportation for patient. I was transferred to [facility] and left a detailed message. CSW will f/u with [facility] transportation in the morning in an attempt to reach Ms.Hepler.   Expected Discharge Plan: Skilled Nursing Facility Barriers to Discharge: Continued Medical Work up  Expected Discharge Plan and Services Expected Discharge Plan: [place]         Expected Discharge Date: 12/20/20                                   Social Determinants of Health (SDOH) Interventions  No SDOH interventions  requested or needed at this time  Readmission Risk Interventions No flowsheet data found.

## 2020-12-29 NOTE — Progress Notes (Signed)
Occupational Therapy Treatment Patient Details Name: Kyle Walton MRN: [id number] DOB: 1955/10/14 Today's Date: 12/29/2020    History of present illness Pt is a 65 y.o. male who presented 5/27 with R leg cellulitis and sepsis. PMH: PAF, OSA, MI, ESRD on HD MWF, DM on insulin, s/p multiple toe amputations, morbid obesity, ESBL/VRE/MRSA and CAD.   OT comments  Patient progressing and showed improved sitting balance/tolerance and ability to laterally scoot along EOB with Min As compared to previous session as well as improved ability to transition Supine<>Sit with Min As to [place] guard of 1 person. Patient remains limited by RT lateral thigh pain, and generalized weakness with refusal to attempt a stand up for ADLs from EOB despite encouragement, along with deficits noted below. Pt continues to demonstrate good rehab potential but his goal of in[patient rehab is not realistic at this time and pt remains SNF appropriate. Pt  would benefit from continued skilled OT to increase safety and independence with ADLs and functional transfers to allow pt to return home safely and reduce caregiver burden and fall risk.   Follow Up Recommendations  SNF;Supervision/Assistance - 24 hour    Equipment Recommendations  3 in 1 bedside commode;Tub/shower bench;Wheelchair (measurements OT);Wheelchair cushion (measurements OT);Hospital bed    Recommendations for Other Services      Precautions / Restrictions Precautions Precautions: Fall;Other (comment) Precaution Comments: contact precautions; Also noteworthy that lateral aspect of R thigh is very painful and sensitive to even light touch Restrictions Weight Bearing Restrictions: No Other Position/Activity Restrictions: h/o multiple toe amputations bilat       Mobility Bed Mobility Overal bed mobility: Needs Assistance Bed Mobility: Rolling;Sidelying to Sit;Sit to Supine Rolling: Min assist (Use of bed pads to assist with roll/Min As. Pt using rails)    Supine to sit: Min guard;HOB elevated (Increased time/effort) Sit to supine: Min assist (To raise RLE to bed.)   General bed mobility comments: Pt refused to try stand, but agreed to work on lateral scooting long EOB with Min As blockng LT knee for levelrage. Pt able to clear hip x3 and scoot with heavy use of UEs .    Transfers                 General transfer comment: Pt refused to attempt    Balance Overall balance assessment: Needs assistance Sitting-balance support: Feet supported Sitting balance-Leahy Scale: Fair Sitting balance - Comments: Pt able to release BUEs from bed to perform UE sponge bathing.                                   ADL either performed or assessed with clinical judgement   ADL Overall ADL's : Needs assistance/impaired     Grooming: Set up;Sitting;Wash/dry face;Wash/dry Nurse, mental health Details (indicate cue type and reason): At EOB Upper Body Bathing: Set up;Sitting;Supervision/ safety Upper Body Bathing Details (indicate cue type and reason): Pt performed UB sponge bathing while sitting EOB Lower Body Bathing: Total assistance;Bed level Lower Body Bathing Details (indicate cue type and reason): See toileting below Upper Body Dressing : Minimal assistance;Sitting Upper Body Dressing Details (indicate cue type and reason): Cues to raise UEs and assist with threading arms through sleeves of anterior gown while EOB. Lower Body Dressing: Total assistance;Bed level     Toilet Transfer Details (indicate cue type and reason): Pt refused to attempt a stand. Pt adamant that "no woman can stand me." ToiletingRunner, broadcasting/film/video  and Hygiene: Total assistance;Bed level Toileting - Clothing Manipulation Details (indicate cue type and reason): Pt requested assist with hygiene post bed level bowel void. Pt able to assist with rollng RT and LT with draw pad pull, adnr equired Total Assist for [employer]. RN notified of red raw buttocks and pt  request for ointment.     Functional mobility during ADLs: Minimal assistance General ADL Comments: Pt refusing to try Denna Haggard stating it is too narrow and rubs on his latearl RT thigh whic pt expressed he cannot tolerate.     Vision Patient Visual Report: No change from baseline Vision Assessment?: No apparent visual deficits   Perception     Praxis      Cognition Arousal/Alertness: Awake/alert Behavior During Therapy: WFL for tasks assessed/performed;Anxious Overall Cognitive Status: Within Functional Limits for tasks assessed                                          Exercises Other Exercises Other Exercises: Pt used BUEs to posteriorly scoot in supine in bed.   Shoulder Instructions       General Comments      Pertinent Vitals/ Pain       Pain Assessment: Faces Faces Pain Scale: Hurts little more Pain Location: R lateral leg with movement and rollng to RT side. Pain Descriptors / Indicators: Guarding;Grimacing Pain Intervention(s): Monitored during session;Limited activity within patient's tolerance;Repositioned (avoided all touch to RT latearl thigh)  Home Living                                          Prior Functioning/Environment              Frequency  Min 2X/week        Progress Toward Goals  OT Goals(current goals can now be found in the care plan section)  Progress towards OT goals: Progressing toward goals  Acute Rehab OT Goals Patient Stated Goal: To keep progressing and to stand up to RW without Steady OT Goal Formulation: With patient Time For Goal Achievement: 01/04/21 Potential to Achieve Goals: [place] Discharge plan remains appropriate    Co-evaluation                 AM-PAC OT "6 Clicks" Daily Activity     Outcome Measure   Help from another person eating meals?: None Help from another person taking care of personal grooming?: A Little Help from another person toileting, which  includes using toliet, bedpan, or urinal?: Total Help from another person bathing (including washing, rinsing, drying)?: A Lot Help from another person to put on and taking off regular upper body clothing?: A Little Help from another person to put on and taking off regular lower body clothing?: Total 6 Click Score: 14    End of Session    OT Visit Diagnosis: Unsteadiness on feet (R26.81);Other abnormalities of gait and mobility (R26.89);Muscle weakness (generalized) (M62.81);Pain Pain - Right/Left: Right Pain - part of body: Leg   Activity Tolerance Patient tolerated treatment well   Patient Left in bed;with call bell/phone within reach;with bed alarm set   Nurse Communication  (Request for ointment for peri area)        Time: [id number] OT Time Calculation (min): 41 min  Charges: OT General  Charges $OT Visit: 1 Visit OT Treatments $Self Care/Home Management : 23-37 mins $Therapeutic Activity: 8-22 mins  Anderson Malta, OT Acute Rehab Services Office: [phone] 12/29/2020   Julien Girt 12/29/2020, 11:11 AM

## 2020-12-29 NOTE — Progress Notes (Signed)
Patient refused VS check and CBG check. Attempted x2.

## 2020-12-29 NOTE — Progress Notes (Signed)
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  He has been started on sodium thiosulfate with HD.   Patient has bilateral lower extremity weakness, right greater than left.  Had extensive work-up including MRI C/T/L spine which did not reveal cord compression.  MRI pelvis showed diffuse edema, right greater than left.  Neurology recommended outpatient follow-up for EMG/nerve conduction study if no improvement in 3 weeks, and signed off.   Cardiology following for PAF/a flutter/prolonged QTc.  Amiodarone increased and digoxin added.  Plan for cardioversion down the road either inpatient or outpatient.   Therapy recommended SNF but better insurance authorization pending.     Subjective: Does not have any new complaints.  Staff did note that oxygen saturations were low at the initiation of dialysis.  Patient is looking forward to going to skilled nursing facility tomorrow.   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 fib/flutter/prolonged QT-QTc  -Seems to be exaggerated by wide QRS.   -CHA2DS2-VASc score > 4. -Cardiology following. -On Eliquis for anticoagulation. -Plan DCCV in 2 weeks -Cardiology has signed off  History of PAD -s/p multiple toe amputations bilaterally, stable -Continue Plavix and statin   RIGHT lateral thigh calciphylaxis -Evaluated by ID, noninfectious  -LE [name] negative for DVT.   -CT of right femur suggestive for soft tissue edema and skin thickening likely due to fluid overload versus cellulitis.  Skin biopsy shows diffuse neutrophilic infiltrate without clear etiology.  PAS staining is negative for fungal hyphae.  With worsening erythema: imaging unremarkable for underlying abscess, DVT negative -Started on sodium thiosulfate with HD per nephrology. -Continue low-dose oxycodone and Lyrica cautiously -Patient rates pain currently at 10/10, unfortunately given patient's ESRD increasing oxycodone, adding morphine, indicated.  -6/16 fentanyl patch 25 mcg.  Monitor closely for oversedation.. -6/19 consult nutrition: Wife would like information on a diet to help control calciphylaxis. -6/21 CSW working on SNF placement   Sepsis due to RLE cellulitis- -Sepsis physiology resolved except for leukocytosis.  Most likely secondary to  patient's calciphylaxis -Completed antibiotic course with vancomycin and cefepime.    Acute toxic encephalopathy,  -Resolved: -5/29 received Narcan with improvement in his mentation. -Minimize opiates or other sedating medications   ESRD on HD  M/W/F:  -Dialysis per nephrology -6/17 ready for discharge from nephrology standpoint   DM type II uncontrolled with hyperglycemia -5/28 hemoglobin A1c= 9.7 - Lantus 15 units daily -6/17 NovoLog 8 units qac -6/17 sensitive SSI   Hypothyroidism:  -Check TSH. -Continue Synthroid   Lower extremity weakness/acute on chronic ambulatory dysfunction:  -Right >>> LEFT; appears to be secondary to pain and not a mechanical/impingement issue  -MRI C, T and L-spine without acute cord compression.   -MRI pelvis with diffuse edema in the right pelvis more than left.  Neurology signed off.   -Therapy recommended SNF.  Bed offer and insurance authorization pending. -Outpatient neurology follow-up for EMG/nerve conduction study if symptoms persist for > 3 weeks. -6/21 PT/OT consult; patient will remain on unit for extended period of time ensure daily PT/OT sessions, to work on ROM and strengthening  OSA:  -Continue CPAP nightly   Legally blind   Leukocytosis:  Somewhat persistent.  Not febrile.  Not on a steroid.  At this point, unclear source. -would continue to monitor   Morbid Obesity: Body mass index is 46.82 kg/m.   Pressure skin injury: Pressure Injury 12/11/20 Sacrum Right;Left;Mid Stage 2 -  Partial thickness loss of dermis presenting as a shallow open injury with a red, pink wound bed without slough. (Active)  12/11/20 0551  Location: Sacrum  Location Orientation: Right;Left;Mid  Staging: Stage 2 -  Partial thickness loss of dermis presenting as a shallow open injury with a red, pink wound bed without slough.  Wound Description (Comments):   Present on Admission:       DVT prophylaxis: Eliquis Code Status: Full Family Communication: 6/19 wife at bedside for discussion of plan of care all questions answered. Status is: Inpatient    Dispo: The patient is from: Home              Anticipated d/c is to: SNF              Anticipated d/c date is: Patient currently is  medically stable to d/c.      Consultants:  Nephrology   Procedures/Significant Events:    I have personally reviewed and interpreted all radiology studies and my findings are as above.  Antimicrobials: Anti-infectives (From admission, onward)    Start     Ordered Stop   12/08/20 1007  vancomycin (VANCOCIN) 1-5 GM/200ML-% IVPB       Note to Pharmacy: Judieth Keens  : cabinet override   12/08/20 1007 12/08/20 1102   12/06/20 1203  vancomycin (VANCOCIN) 1-5 GM/200ML-% IVPB       Note to Pharmacy: Wallace Cullens   : cabinet override   12/06/20 1203 12/06/20 1206   12/04/20 1030  vancomycin (VANCOREADY) IVPB 1000 mg/200 mL        12/04/20 0930 12/04/20 1230   12/03/20 2300  vancomycin (VANCOREADY) IVPB 1000 mg/200 mL        12/03/20 2019 12/09/20 2359   12/03/20 1830  ceFEPIme (MAXIPIME) 1 g in sodium chloride 0.9 % 100 mL IVPB  Status:  Discontinued        12/03/20 1802 12/05/20 1630   12/03/20 1800  vancomycin (VANCOREADY) IVPB 1000 mg/200 mL  Status:  Discontinued        12/03/20 1756 12/03/20 1801  12/03/20 1300  vancomycin (VANCOREADY) IVPB 1750 mg/350 mL        12/03/20 1252 12/03/20 1706   12/03/20 1300  cefTRIAXone (ROCEPHIN) 2 g in sodium chloride 0.9 % 100 mL IVPB        12/03/20 1252 12/03/20 1345         Devices    LINES / TUBES:      Continuous Infusions:  sodium thiosulfate infusion for calciphylaxis 25 g (12/29/20 1646)     Objective: Vitals:   12/29/20 1500 12/29/20 1530 12/29/20 1600 12/29/20 1630  BP: 99/66 (!) 98/59 (!) 108/58 97/60  Pulse: 97 95    Resp: 15 13    Temp:      TempSrc:      SpO2: 97% 99%    Weight:      Height:        Intake/Output Summary (Last 24 hours) at 12/29/2020 1748 Last data filed at 12/29/2020 1300 Gross per 24 hour  Intake 960 ml  Output 0 ml  Net 960 ml   Filed Weights   12/27/20 2217 12/28/20 0419 12/29/20 1348  Weight: 122 kg 120.5 kg 120 kg    Examination:  General exam: Alert, awake,  oriented x 3 Respiratory system: Clear to auscultation. Respiratory effort normal. Cardiovascular system:RRR. No murmurs, rubs, gallops. Gastrointestinal system: Abdomen is nondistended, soft and nontender. No organomegaly or masses felt. Normal bowel sounds heard. Central nervous system: Alert and oriented. No focal neurological deficits. Extremities: 1+ edema bilaterally Psychiatry: Judgement and insight appear normal. Mood & affect appropriate.   Data Reviewed: Care during the described time interval was provided by me .  I have reviewed this patient's available data, including medical history, events of note, physical examination, and all test results as part of my evaluation.  CBC: Recent Labs  Lab 12/25/20 0430 12/26/20 0244 12/27/20 0352 12/28/20 0723 12/29/20 0404  WBC 15.3* 14.2* 14.6* 12.7* 13.4*  NEUTROABS 12.1* 10.3* 11.6* 9.8* 10.6*  HGB 10.8* 11.0* 10.2* 10.0* 10.1*  HCT 34.6* 36.7* 33.6* 32.7* 32.5*  MCV 90.8 93.6 92.6 91.3 91.5  PLT 331 312 317 276 [id number]   Basic Metabolic Panel: Recent Labs  Lab 12/24/20 0347 12/25/20 0430 12/26/20 0244 12/27/20 0352 12/28/20 0723 12/29/20 0404  NA 137 141 141 140 139  --   K 4.2 4.1 4.4 4.5 3.7  --   CL 97* 97* 97* 99 96*  --   CO2 '27 25 24 22 23  '$ --   GLUCOSE 193* 160* 133* 148* 115*  --   BUN 32* 21 29* 40* 23  --   CREATININE 5.34* 3.94* 4.96* 6.07* 4.17*  --   CALCIUM 8.7* 9.1 9.4 8.9 8.3*  --   MG 1.7 2.0 1.9 1.9 1.7 1.8  PHOS 3.7 2.6 4.2 5.2* 3.6 4.7*   GFR: Estimated Creatinine Clearance: 22.9 mL/min (A) (by C-G formula based on SCr of 4.17 mg/dL (H)). Liver Function Tests: Recent Labs  Lab 12/24/20 0347 12/25/20 0430 12/26/20 0244 12/27/20 0352 12/28/20 0723  AST 32 37 36 55* 52*  ALT '9 9 9 12 12  '$ ALKPHOS 126 149* 136* 228* 190*  BILITOT 1.2 1.0 0.8 1.1 1.0  PROT 6.1* 6.6 6.9 6.8 6.4*  ALBUMIN 2.2* 2.3* 2.4* 2.4* 2.3*   No results for input(s): LIPASE, AMYLASE in the last 168 hours. No results for  input(s): AMMONIA in the last 168 hours. Coagulation Profile: No results for input(s): INR, PROTIME in the last 168 hours. Cardiac Enzymes: No  results for input(s): CKTOTAL, CKMB, CKMBINDEX, TROPONINI in the last 168 hours. BNP (last 3 results) No results for input(s): PROBNP in the last 8760 hours. HbA1C: No results for input(s): HGBA1C in the last 72 hours. CBG: Recent Labs  Lab 12/28/20 1621 12/28/20 2039 12/29/20 0428 12/29/20 0804 12/29/20 1147  GLUCAP 86 124* 147* 141* 80   Lipid Profile: No results for input(s): CHOL, HDL, LDLCALC, TRIG, CHOLHDL, LDLDIRECT in the last 72 hours.  Thyroid Function Tests: Recent Labs    12/27/20 0352  TSH 8.606*   Anemia Panel: No results for input(s): VITAMINB12, FOLATE, FERRITIN, TIBC, IRON, RETICCTPCT in the last 72 hours. Sepsis Labs: No results for input(s): PROCALCITON, LATICACIDVEN in the last 168 hours.  Recent Results (from the past 240 hour(s))  Resp Panel by RT-PCR (Flu A&B, Covid) Nasopharyngeal Swab     Status: None   Collection Time: 12/20/20  8:06 AM   Specimen: Nasopharyngeal Swab; Nasopharyngeal(NP) swabs in vial transport medium  Result Value Ref Range Status   SARS Coronavirus 2 by RT PCR NEGATIVE NEGATIVE Final    Comment: (NOTE) SARS-CoV-2 target nucleic acids are NOT DETECTED.  The SARS-CoV-2 RNA is generally detectable in upper respiratory specimens during the acute phase of infection. The lowest concentration of SARS-CoV-2 viral copies this assay can detect is 138 copies/mL. A negative result does not preclude SARS-Cov-2 infection and should not be used as the sole basis for treatment or other patient management decisions. A negative result may occur with  improper specimen collection/handling, submission of specimen other than nasopharyngeal swab, presence of viral mutation(s) within the areas targeted by this assay, and inadequate number of viral copies(<138 copies/mL). A negative result must be  combined with clinical observations, patient history, and epidemiological information. The expected result is Negative.  Fact Sheet for Patients:  EntrepreneurPulse.com.au  Fact Sheet for Healthcare Providers:  IncredibleEmployment.be  This test is no t yet approved or cleared by the [place] FDA and  has been authorized for detection and/or diagnosis of SARS-CoV-2 by FDA under an Emergency Use Authorization (EUA). This EUA will remain  in effect (meaning this test can be used) for the duration of the COVID-19 declaration under Section 564(b)(1) of the Act, 21 U.S.C.section 360bbb-3(b)(1), unless the authorization is terminated  or revoked sooner.       Influenza A by PCR NEGATIVE NEGATIVE Final   Influenza B by PCR NEGATIVE NEGATIVE Final    Comment: (NOTE) The Xpert Xpress SARS-CoV-2/FLU/RSV plus assay is intended as an aid in the diagnosis of influenza from Nasopharyngeal swab specimens and should not be used as a sole basis for treatment. Nasal washings and aspirates are unacceptable for Xpert Xpress SARS-CoV-2/FLU/RSV testing.  Fact Sheet for Patients: EntrepreneurPulse.com.au  Fact Sheet for Healthcare Providers: IncredibleEmployment.be  This test is not yet approved or cleared by the [place] FDA and has been authorized for detection and/or diagnosis of SARS-CoV-2 by FDA under an Emergency Use Authorization (EUA). This EUA will remain in effect (meaning this test can be used) for the duration of the COVID-19 declaration under Section 564(b)(1) of the Act, 21 U.S.C. section 360bbb-3(b)(1), unless the authorization is terminated or revoked.  Performed at [facility], [place] [address], [place], [place] [address]   Culture, blood (routine x 2)     Status: None   Collection Time: 12/21/20 12:18 PM   Specimen: BLOOD  Result Value Ref Range Status   Specimen Description BLOOD RIGHT  ANTECUBITAL  Final   Special  Requests   Final    BOTTLES DRAWN AEROBIC AND ANAEROBIC Blood Culture adequate volume   Culture   Final    NO GROWTH 5 DAYS Performed at [facility], [place] [address], [place], [place] [address]    Report Status 12/26/2020 FINAL  Final  Culture, blood (routine x 2)     Status: None   Collection Time: 12/21/20 12:23 PM   Specimen: BLOOD LEFT HAND  Result Value Ref Range Status   Specimen Description BLOOD LEFT HAND  Final   Special Requests   Final    BOTTLES DRAWN AEROBIC AND ANAEROBIC Blood Culture adequate volume   Culture   Final    NO GROWTH 5 DAYS Performed at [facility], [place] [address] [place], [place] [address]    Report Status 12/26/2020 FINAL  Final          Radiology Studies: No results found.      Scheduled Meds:  heparin sodium (porcine)       (feeding supplement) PROSource Plus  30 mL Oral BID BM   amiodarone  400 mg Oral BID   apixaban  5 mg Oral BID   atorvastatin  40 mg Oral Daily   Chlorhexidine Gluconate Cloth  6 each Topical Q0600   cholestyramine light  4 g Oral Q12H   clopidogrel  75 mg Oral Daily   darbepoetin (ARANESP) injection - DIALYSIS  60 mcg Intravenous Q Wed-HD   digoxin  0.0625 mg Oral QODAY   docusate sodium  100 mg Oral BID   feeding supplement (NEPRO CARB STEADY)  237 mL Oral BID BM   fentaNYL  1 patch Transdermal Q72H   insulin aspart  0-9 Units Subcutaneous Q4H   insulin aspart  8 Units Subcutaneous TID WC   insulin glargine  15 Units Subcutaneous QHS   levothyroxine  12.5 mcg Oral QAC breakfast   lidocaine   Topical TID   metoprolol succinate  12.5 mg Oral Daily   midodrine  10 mg Oral TID WC   multivitamin  1 tablet Oral QHS   polyethylene glycol  17 g Oral Daily   pregabalin  25 mg Oral Daily   senna-docusate  1 tablet Oral BID   sevelamer carbonate  1,600 mg Oral TID WC   Continuous Infusions:  sodium thiosulfate infusion for calciphylaxis 25 g (12/29/20 1646)     LOS:  26 days    Time spent: 30 min    Kathie Dike, MD [facility]   If 7PM-7AM, please contact night-coverage 12/29/2020, 5:48 PM

## 2020-12-29 NOTE — Progress Notes (Signed)
[facility] Progress Note   Subjective:     Patient was seen and examined today at bedside. No complaints. Denies pain, SOB, CP, N/V/D. Awaiting SNF placement-plan for HD today.   Objective Vitals:   12/28/20 1632 12/28/20 2019 12/29/20 0427 12/29/20 1105  BP: (!) 112/52 (!) 116/22 (!) 110/49 (!) 103/58  Pulse: 86 72 74 90  Resp: '18 16  19  '$ Temp: 97.9 F (36.6 C) 97.8 F (36.6 C) 97.6 F (36.4 C) 98 F (36.7 C)  TempSrc: Oral Oral Oral Oral  SpO2: 92% 91% 95% 92%  Weight:      Height:       Physical Exam General: More awake today, comfortable, No acute respiratory distress Heart: Normal S1 and S2; No murmurs, gallops, or rubs Lungs: Clear throughout; No wheezing, rales, or rhonchi Abdomen: Soft, non-tender, active bowel sounds Extremities: No edema LLE, dry dressing in place on R thigh-CDI Dialysis Access: [facility]   Filed Weights   12/27/20 1011 12/27/20 2217 12/28/20 0419  Weight: 123 kg 122 kg 120.5 kg    Intake/Output Summary (Last 24 hours) at 12/29/2020 1154 Last data filed at 12/29/2020 0700 Gross per 24 hour  Intake 1560 ml  Output 0 ml  Net 1560 ml    Additional Objective Labs: Basic Metabolic Panel: Recent Labs  Lab 12/26/20 0244 12/27/20 0352 12/28/20 0723 12/29/20 0404  NA 141 140 139  --   K 4.4 4.5 3.7  --   CL 97* 99 96*  --   CO2 '24 22 23  '$ --   GLUCOSE 133* 148* 115*  --   BUN 29* 40* 23  --   CREATININE 4.96* 6.07* 4.17*  --   CALCIUM 9.4 8.9 8.3*  --   PHOS 4.2 5.2* 3.6 4.7*   Liver Function Tests: Recent Labs  Lab 12/26/20 0244 12/27/20 0352 12/28/20 0723  AST 36 55* 52*  ALT '9 12 12  '$ ALKPHOS 136* 228* 190*  BILITOT 0.8 1.1 1.0  PROT 6.9 6.8 6.4*  ALBUMIN 2.4* 2.4* 2.3*   No results for input(s): LIPASE, AMYLASE in the last 168 hours. CBC: Recent Labs  Lab 12/25/20 0430 12/26/20 0244 12/27/20 0352 12/28/20 0723 12/29/20 0404  WBC 15.3* 14.2* 14.6* 12.7* 13.4*  NEUTROABS 12.1* 10.3* 11.6* 9.8* 10.6*   HGB 10.8* 11.0* 10.2* 10.0* 10.1*  HCT 34.6* 36.7* 33.6* 32.7* 32.5*  MCV 90.8 93.6 92.6 91.3 91.5  PLT 331 312 317 276 271   Blood Culture    Component Value Date/Time   SDES BLOOD LEFT HAND 12/21/2020 1223   SPECREQUEST  12/21/2020 1223    BOTTLES DRAWN AEROBIC AND ANAEROBIC Blood Culture adequate volume   CULT  12/21/2020 1223    NO GROWTH 5 DAYS Performed at [facility], [place] [address], [place], [place] [address]    REPTSTATUS 12/26/2020 FINAL 12/21/2020 1223    Cardiac Enzymes: No results for input(s): CKTOTAL, CKMB, CKMBINDEX, TROPONINI in the last 168 hours. CBG: Recent Labs  Lab 12/28/20 1621 12/28/20 2039 12/29/20 0428 12/29/20 0804 12/29/20 1147  GLUCAP 86 124* 147* 141* 80   Iron Studies: No results for input(s): IRON, TIBC, TRANSFERRIN, FERRITIN in the last 72 hours. Lab Results  Component Value Date   INR 1.5 (H) 12/04/2020   INR 1.6 (H) 12/03/2020   INR 1.1 01/17/2020   Studies/Results: No results found.  Medications:  sodium thiosulfate infusion for calciphylaxis 25 g (12/24/20 1657)    (feeding supplement) PROSource Plus  30 mL  Oral BID BM   amiodarone  400 mg Oral BID   apixaban  5 mg Oral BID   atorvastatin  40 mg Oral Daily   Chlorhexidine Gluconate Cloth  6 each Topical Q0600   cholestyramine light  4 g Oral Q12H   clopidogrel  75 mg Oral Daily   darbepoetin (ARANESP) injection - DIALYSIS  60 mcg Intravenous Q Wed-HD   digoxin  0.0625 mg Oral QODAY   docusate sodium  100 mg Oral BID   feeding supplement (NEPRO CARB STEADY)  237 mL Oral BID BM   fentaNYL  1 patch Transdermal Q72H   insulin aspart  0-9 Units Subcutaneous Q4H   insulin aspart  8 Units Subcutaneous TID WC   insulin glargine  15 Units Subcutaneous QHS   levothyroxine  12.5 mcg Oral QAC breakfast   lidocaine   Topical TID   metoprolol succinate  12.5 mg Oral Daily   midodrine  10 mg Oral TID WC   multivitamin  1 tablet Oral QHS   polyethylene glycol  17 g Oral  Daily   pregabalin  25 mg Oral Daily   senna-docusate  1 tablet Oral BID   sevelamer carbonate  1,600 mg Oral TID WC    Dialysis Orders: MWF at [facility] 4hr, 450/800, EDW 117kg, 2K/3Ca, TDC, heparin 4900 bolus + 500/hr  Assessment/Plan: 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 Punch Bx performed - c/w infection, but finished course of Vanc/Cefepime without improvement. Imaging without abscess. BCx 6/14 negative. ID consulted - felt c/w calciphylaxis -> start Na Thiosulfate q HD. Ca/Vit D products stopped. 2. ESRD: Continue HD per usual MWF schedule - Plan for HD today  3. HTN/volume: BP low/stable (on mido) and metop for A-flutter. Had been running into issues with ^ UFG triggering A-flutter-has been denying issues with this last 2 days-continue to monitor. 4. Anemia of ESRD: Hgb 10.1 - on Aranesp q Wednesday, hold if rises further.. 5. Secondary hyperparathyroidism: CorrCa high today, Phos ok. On Renvela as binder. Using 2Ca bath with HD. Last PTH 67 on 6/9 (low). 6. Nutrition: Alb low - continue protein supplements. 7. T2DM: Per primary. 8. A-flutter: Amiodarone increased and digoxin started, per EP team. On Eliquis with plan for cardioversion in future. 9. 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 10. CAD/Hx CABG 11. 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

## 2020-12-30 ENCOUNTER — Inpatient Hospital Stay (HOSPITAL_COMMUNITY): Payer: Medicare HMO

## 2020-12-30 LAB — MAGNESIUM: Magnesium: 1.8 mg/dL (ref 1.7–2.4)

## 2020-12-30 LAB — CBC WITH DIFFERENTIAL/PLATELET
Abs Immature Granulocytes: 0.08 10*3/uL — ABNORMAL HIGH (ref 0.00–0.07)
Basophils Absolute: 0.1 10*3/uL (ref 0.0–0.1)
Basophils Relative: 0 %
Eosinophils Absolute: 0.2 10*3/uL (ref 0.0–0.5)
Eosinophils Relative: 1 %
HCT: 31.3 % — ABNORMAL LOW (ref 39.0–52.0)
Hemoglobin: 9.6 g/dL — ABNORMAL LOW (ref 13.0–17.0)
Immature Granulocytes: 1 %
Lymphocytes Relative: 6 %
Lymphs Abs: 0.8 10*3/uL (ref 0.7–4.0)
MCH: 27.8 pg (ref 26.0–34.0)
MCHC: 30.7 g/dL (ref 30.0–36.0)
MCV: 90.7 fL (ref 80.0–100.0)
Monocytes Absolute: 1.5 10*3/uL — ABNORMAL HIGH (ref 0.1–1.0)
Monocytes Relative: 10 %
Neutro Abs: 11.7 10*3/uL — ABNORMAL HIGH (ref 1.7–7.7)
Neutrophils Relative %: 82 %
Platelets: 251 10*3/uL (ref 150–400)
RBC: 3.45 MIL/uL — ABNORMAL LOW (ref 4.22–5.81)
RDW: 17.3 % — ABNORMAL HIGH (ref 11.5–15.5)
WBC: 14.3 10*3/uL — ABNORMAL HIGH (ref 4.0–10.5)
nRBC: 0 % (ref 0.0–0.2)

## 2020-12-30 LAB — GLUCOSE, CAPILLARY
Glucose-Capillary: 117 mg/dL — ABNORMAL HIGH (ref 70–99)
Glucose-Capillary: 127 mg/dL — ABNORMAL HIGH (ref 70–99)
Glucose-Capillary: 137 mg/dL — ABNORMAL HIGH (ref 70–99)
Glucose-Capillary: 191 mg/dL — ABNORMAL HIGH (ref 70–99)
Glucose-Capillary: 86 mg/dL (ref 70–99)

## 2020-12-30 LAB — PHOSPHORUS: Phosphorus: 3.8 mg/dL (ref 2.5–4.6)

## 2020-12-30 LAB — DIGOXIN LEVEL: Digoxin Level: 0.8 ng/mL (ref 0.8–2.0)

## 2020-12-30 IMAGING — DX DG CHEST 1V PORT
1 series · 1 of 1 positions shown · non-contrast
Comparison: Portable exam [T1] hours compared to [DATE]

CLINICAL DATA: Shortness of breath, diabetes mellitus, mi, CABG

EXAM:
PORTABLE CHEST 1 VIEW

[chest ap]
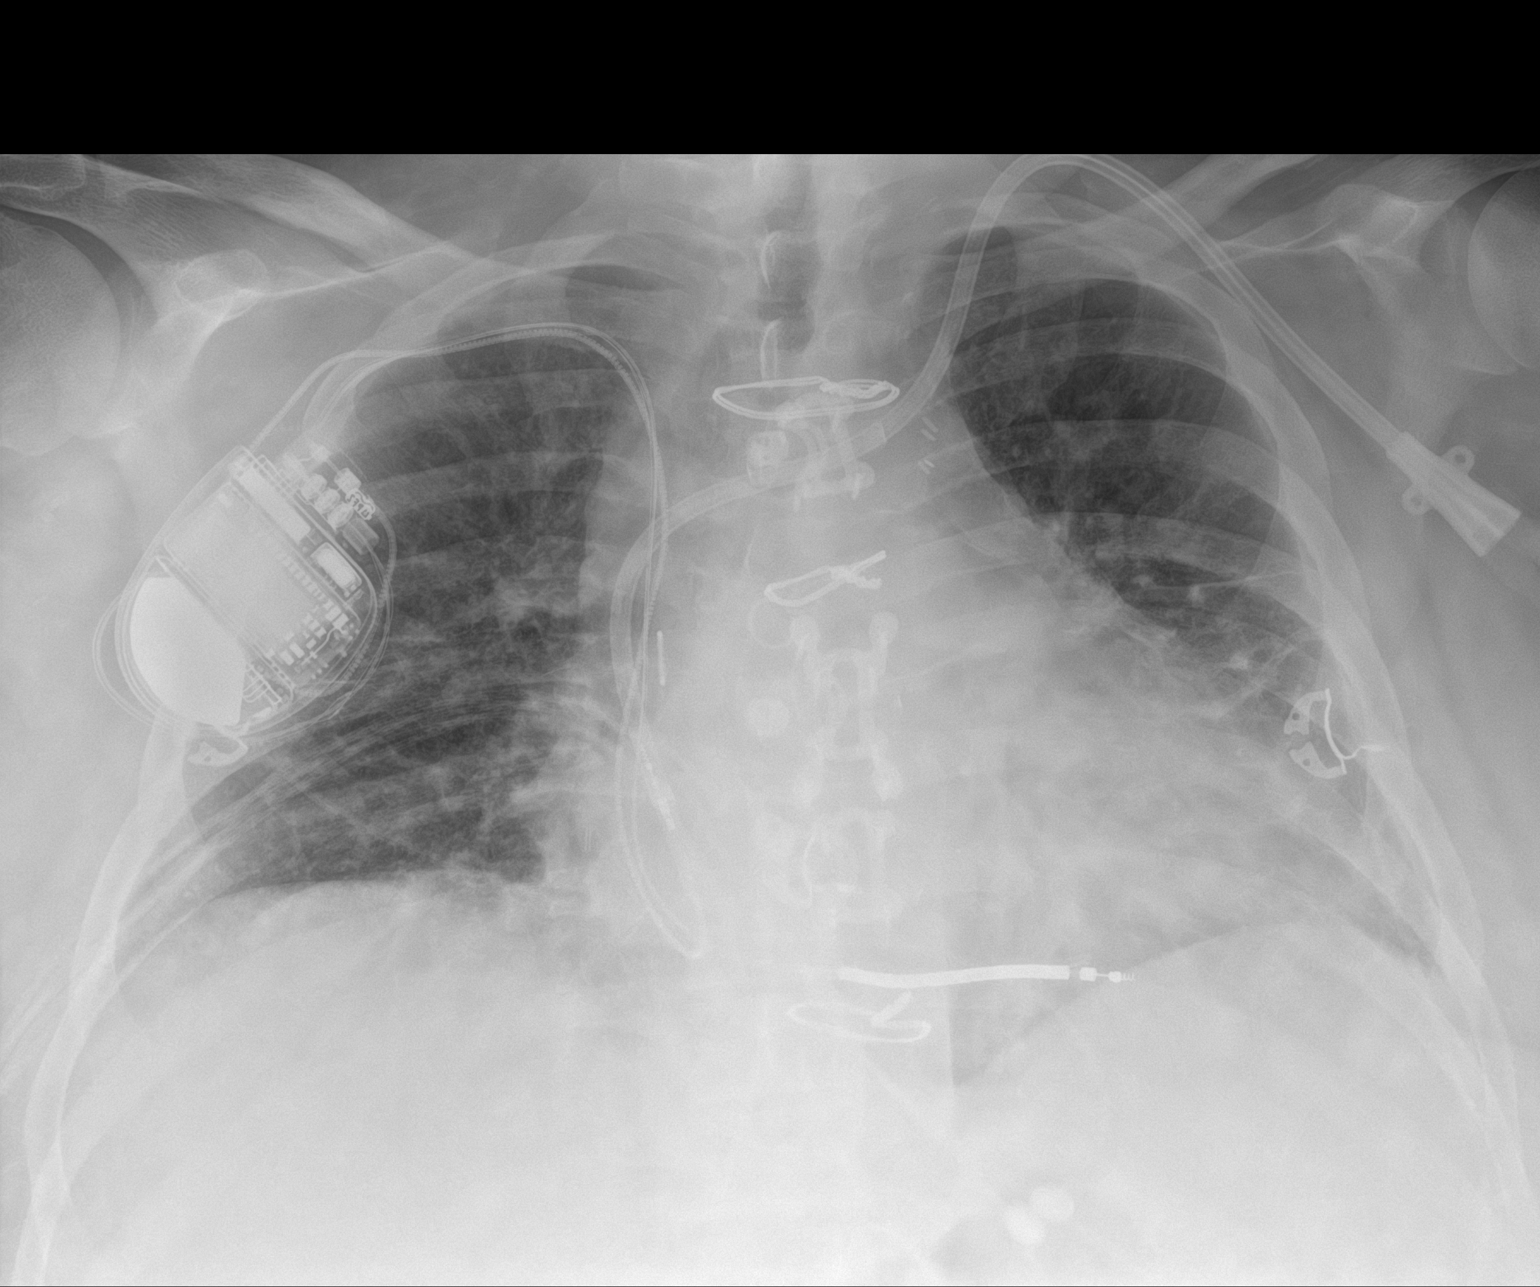

[1 of 1 positions shown; findings below may reference images not displayed]

FINDINGS: LEFT jugular dual-lumen central venous catheter with tip projecting
over RIGHT atrium.

RIGHT subclavian ICD with leads projecting over RIGHT atrium and
RIGHT ventricle.

Enlargement of cardiac silhouette with slight pulmonary vascular
congestion post CABG.

Decreased lung volumes with bibasilar atelectasis.

No acute failure or consolidation.

No pleural effusion or pneumothorax.
IMPRESSION: Enlargement of cardiac silhouette with pulmonary vascular
congestion.

Bibasilar atelectasis.

## 2020-12-30 MED ORDER — DIGOXIN 62.5 MCG PO TABS: 0.0625 mg | ORAL_TABLET | ORAL | Status: DC

## 2020-12-30 MED ORDER — LIDOCAINE 5 % EX OINT: TOPICAL_OINTMENT | Freq: Three times a day (TID) | CUTANEOUS | 0 refills | Status: AC

## 2020-12-30 MED ORDER — RENA-VITE PO TABS: 1.0000 | ORAL_TABLET | Freq: Every day | ORAL | 0 refills | Status: DC

## 2020-12-30 MED ORDER — POLYETHYLENE GLYCOL 3350 17 G PO PACK: 17.0000 g | PACK | Freq: Every day | ORAL | 0 refills | Status: DC

## 2020-12-30 MED ORDER — ZINC OXIDE 40 % EX OINT
TOPICAL_OINTMENT | Freq: Three times a day (TID) | CUTANEOUS | Status: DC | PRN
  Filled 2020-12-30: qty 57

## 2020-12-30 MED ORDER — CHLORHEXIDINE GLUCONATE CLOTH 2 % EX PADS: 6.0000 | MEDICATED_PAD | Freq: Every day | CUTANEOUS | Status: DC

## 2020-12-30 MED ORDER — AMIODARONE HCL 400 MG PO TABS
400.0000 mg | ORAL_TABLET | Freq: Two times a day (BID) | ORAL | Status: DC
Start: 2020-12-30 — End: 2021-01-06

## 2020-12-30 MED ORDER — METOPROLOL SUCCINATE ER 25 MG PO TB24: 12.5000 mg | ORAL_TABLET | Freq: Every day | ORAL | Status: AC

## 2020-12-30 MED ORDER — FENTANYL 25 MCG/HR TD PT72: 1.0000 | MEDICATED_PATCH | TRANSDERMAL | 0 refills | Status: DC

## 2020-12-30 MED ORDER — SEVELAMER CARBONATE 800 MG PO TABS: 1600.0000 mg | ORAL_TABLET | Freq: Three times a day (TID) | ORAL | Status: AC

## 2020-12-30 MED ORDER — DOCUSATE SODIUM 100 MG PO CAPS: 100.0000 mg | ORAL_CAPSULE | Freq: Two times a day (BID) | ORAL | 0 refills | Status: DC

## 2020-12-30 NOTE — Progress Notes (Signed)
Renal Navigator made aware that patient has not been up to sit in a chair for any significant length of time throughout hospitalization and he is stating that he is unable to do so. This is especially a barrier to discharge since he is a dialysis patient. Navigator spoke with Attending to suggest that he sit for 4 hours today in his room and then be allowed to discharge if he can tolerate. Navigator, 2 RNs and a NT went in to discuss with patient and attempt to get him in to his bedside chair with a Steady. Patient was very willing to try to get in to the chair and gave valiant attempts twice to stand in to the steady to be moved, but was unsuccessful. He sat, seemingly without discomfort, on the side of his bed and states that he can sit without issue, but just cannot get in to the chair. Navigator reiterated to patient that this is not the report that Navigator received, and wanted to clarify. He said, "if you stop talking, I'll tell you." He states ability to sit in chair for 4 hours, and knows how important this ability is for HD. He said he is just not able to transfer in to the chair. Navigator stated understanding and told patient that he can be transferred by a hoyer lift and that Navigator will ensure that his SNF and HD clinic have a sling and lift respectively to prepare for this acceptance and return. Navigator spoke with [facility] who confirmed with her staff that they have hoyer slings and Navigator spoke with [facility] that they have lifts.  Navigator has faxed Discharge Summary and Renal Note from today to [facility] for continuity of care.   Alphonzo Cruise, [place] Renal Navigator [phone]

## 2020-12-30 NOTE — Progress Notes (Addendum)
[facility] Progress Note   Subjective:     Patient was seen and examined today at bedside. No complaints. Denies pain, SOB, CP, N/V/D. Tolerating yesterday's HD with net UF 2.5L. Awaiting SNF placement. Plan for HD 6/24 if patient is still here.  Objective Vitals:   12/29/20 2044 12/29/20 2058 12/29/20 2349 12/30/20 0508  BP: (!) 101/57   (!) 117/53  Pulse: 93  90 89  Resp: '18  17 20  '$ Temp: 98 F (36.7 C)   98.6 F (37 C)  TempSrc: Oral     SpO2: (!) 87% 90% 93% (!) 89%  Weight:      Height:       Physical Exam General: Well-appearing; comfortable, No acute respiratory distress Heart: Normal S1 and S2; No murmurs, gallops, or rubs Lungs: Clear throughout; No wheezing, rales, or rhonchi Abdomen: Soft, non-tender, active bowel sounds Extremities: No edema LLE, dry dressing in place on R thigh-CDI Dialysis Access: [facility]   Filed Weights   12/28/20 0419 12/29/20 1348 12/29/20 1723  Weight: 120.5 kg 120 kg 117.5 kg    Intake/Output Summary (Last 24 hours) at 12/30/2020 0924 Last data filed at 12/30/2020 0800 Gross per 24 hour  Intake 720 ml  Output 2500 ml  Net -1780 ml    Additional Objective Labs: Basic Metabolic Panel: Recent Labs  Lab 12/26/20 0244 12/27/20 0352 12/28/20 0723 12/29/20 0404 12/30/20 0338  NA 141 140 139  --   --   K 4.4 4.5 3.7  --   --   CL 97* 99 96*  --   --   CO2 '24 22 23  '$ --   --   GLUCOSE 133* 148* 115*  --   --   BUN 29* 40* 23  --   --   CREATININE 4.96* 6.07* 4.17*  --   --   CALCIUM 9.4 8.9 8.3*  --   --   PHOS 4.2 5.2* 3.6 4.7* 3.8   Liver Function Tests: Recent Labs  Lab 12/26/20 0244 12/27/20 0352 12/28/20 0723  AST 36 55* 52*  ALT '9 12 12  '$ ALKPHOS 136* 228* 190*  BILITOT 0.8 1.1 1.0  PROT 6.9 6.8 6.4*  ALBUMIN 2.4* 2.4* 2.3*   No results for input(s): LIPASE, AMYLASE in the last 168 hours. CBC: Recent Labs  Lab 12/26/20 0244 12/27/20 0352 12/28/20 0723 12/29/20 0404 12/30/20 0338  WBC 14.2*  14.6* 12.7* 13.4* 14.3*  NEUTROABS 10.3* 11.6* 9.8* 10.6* 11.7*  HGB 11.0* 10.2* 10.0* 10.1* 9.6*  HCT 36.7* 33.6* 32.7* 32.5* 31.3*  MCV 93.6 92.6 91.3 91.5 90.7  PLT 312 317 276 271 251   Blood Culture    Component Value Date/Time   SDES BLOOD LEFT HAND 12/21/2020 1223   SPECREQUEST  12/21/2020 1223    BOTTLES DRAWN AEROBIC AND ANAEROBIC Blood Culture adequate volume   CULT  12/21/2020 1223    NO GROWTH 5 DAYS Performed at [facility], [place] [address] [place], [place] [address]    REPTSTATUS 12/26/2020 FINAL 12/21/2020 1223    Cardiac Enzymes: No results for input(s): CKTOTAL, CKMB, CKMBINDEX, TROPONINI in the last 168 hours. CBG: Recent Labs  Lab 12/29/20 1147 12/29/20 1841 12/29/20 2035 12/30/20 0029 12/30/20 0505  GLUCAP 80 119* 136* 137* 117*   Iron Studies: No results for input(s): IRON, TIBC, TRANSFERRIN, FERRITIN in the last 72 hours. Lab Results  Component Value Date   INR 1.5 (H) 12/04/2020   INR 1.6 (H) 12/03/2020  INR 1.1 01/17/2020   Studies/Results: No results found.  Medications:  sodium thiosulfate infusion for calciphylaxis 25 g (12/29/20 1646)    (feeding supplement) PROSource Plus  30 mL Oral BID BM   amiodarone  400 mg Oral BID   apixaban  5 mg Oral BID   atorvastatin  40 mg Oral Daily   Chlorhexidine Gluconate Cloth  6 each Topical Q0600   cholestyramine light  4 g Oral Q12H   clopidogrel  75 mg Oral Daily   darbepoetin (ARANESP) injection - DIALYSIS  60 mcg Intravenous Q Wed-HD   digoxin  0.0625 mg Oral QODAY   docusate sodium  100 mg Oral BID   feeding supplement (NEPRO CARB STEADY)  237 mL Oral BID BM   fentaNYL  1 patch Transdermal Q72H   insulin aspart  0-9 Units Subcutaneous Q4H   insulin aspart  8 Units Subcutaneous TID WC   insulin glargine  15 Units Subcutaneous QHS   levothyroxine  12.5 mcg Oral QAC breakfast   lidocaine   Topical TID   metoprolol succinate  12.5 mg Oral Daily   midodrine  10 mg Oral TID WC    multivitamin  1 tablet Oral QHS   polyethylene glycol  17 g Oral Daily   pregabalin  25 mg Oral Daily   senna-docusate  1 tablet Oral BID   sevelamer carbonate  1,600 mg Oral TID WC    Dialysis Orders: MWF at [facility] 4hr, 450/800, EDW 117kg, 2K/3Ca, TDC, heparin 4900 bolus + 500/hr   Assessment/Plan: 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 Punch Bx performed - c/w infection, but finished course of Vanc/Cefepime without improvement. Imaging without abscess. BCx 6/14 negative. ID consulted - felt c/w calciphylaxis -> start Na Thiosulfate q HD. Ca/Vit D products stopped. 2. ESRD: Continue HD per usual MWF schedule - Tolerated yesterday's HD with net UF 2.5L-plan for HD 6/24 if patient is still here. 3. HTN/volume: BP low/stable (on mido) and metop for A-flutter. Had been running into issues with ^ UFG triggering A-flutter-no issues with this currently-will monitor 4. Anemia of ESRD: Hgb 9.6 - on Aranesp q Wednesday, hold if rises further.. 5. Secondary hyperparathyroidism: CorrCa high today, Phos ok. On Renvela as binder. Using 2Ca bath with HD. Last PTH 67 on 6/9 (low). 6. Nutrition: Alb low - continue protein supplements. 7. T2DM: Per primary. 8. A-flutter: Amiodarone increased and digoxin started, per EP team. On Eliquis with plan for cardioversion in future. 9. 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 10. CAD/Hx CABG 11. 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   Tobie Poet, NP [facility] 12/30/2020,9:24 AM  LOS: 27 days

## 2020-12-30 NOTE — Discharge Summary (Addendum)
Physician Discharge Summary  Kyle Walton [id number] DOB: 09/03/1955 DOA: 12/03/2020  PCP: Patient, No Pcp Per (Inactive)  Admit date: 12/03/2020 Discharge date: 12/31/2020  Admitted From: home Disposition:  SNF  Recommendations for Outpatient Follow-up:  Follow up with PCP in 1-2 weeks Please obtain BMP/CBC in one week Follow up with cardiology for cardioversion in 2 weeks Outpatient referral to neurology has been made for EMG studies for LE weakness  Home Health: Equipment/Devices:oxygen at 2L/min  Discharge Condition:stable CODE STATUS:full code Diet recommendation: heart healthy, carb modified  Brief/Interim Summary: 65 year old WM PMHx  CAD/CABG, diastolic CHF, PAF/flutter status post AICD, DM-2 with PVD s/p several toe amputations, ESRD on HD M/W/F, , OSA and debility   Presenting with right leg pain and swelling and admitted for sepsis due to right lower extremity cellulitis, and 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  He has been started on sodium thiosulfate with HD.   Patient has bilateral lower extremity weakness, right greater than left.  Had extensive work-up including MRI C/T/L spine which did not reveal cord compression.  MRI pelvis showed diffuse edema, right greater than left.  Neurology recommended outpatient follow-up for EMG/nerve conduction study if no improvement in 3 weeks, and signed off.   Cardiology following for PAF/a flutter/prolonged QTc.  Amiodarone increased and digoxin added.  Plan for cardioversion down the road either inpatient or outpatient.   Therapy recommended SNF but better insurance authorization pending.  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)   Chronic diastolic congestive heart failure, NYHA class 4 ([place])   ESRD on hemodialysis (HCC)   Cellulitis of right leg   Sepsis (HCC)   Pressure injury of skin  Chronic diastolic CHF: -Strict in and out  - Daily weight      Filed Weights    12/27/20 2217 12/28/20 0419 12/29/20 1348  Weight: 122 kg 120.5 kg 120 kg   - HD for fluid management per nephrology -Amiodarone 400 mg BID -Digoxin 0.0625 mg QOD - Toprol 12.5 mg daily -patient noted to be hypoxic on room air and will be discharged with oxygen at 2L   Essential HTN - BP soft for a HD patient - Midodrine 10 mg TID (for hypotension)   CAD-s/p CABG 2017: -See CHF No anginal symptoms   Paroxysmal A. fib/flutter/prolonged QT-QTc  -Seems to be exaggerated by wide QRS.   -CHA2DS2-VASc score > 4. -Cardiology following. -On Eliquis for anticoagulation. -Plan DCCV in 2 weeks -he is on amiodarone, digoxin and toprol   History of PAD -s/p multiple toe amputations bilaterally, stable -Continue Plavix and statin   RIGHT lateral thigh calciphylaxis -Evaluated by ID, noninfectious -LE [name] negative for DVT.   -CT of right femur suggestive for soft tissue edema and skin thickening likely due to fluid overload versus cellulitis.  Skin biopsy shows diffuse neutrophilic infiltrate without clear etiology.  PAS staining is negative for fungal hyphae.  With worsening erythema: imaging unremarkable for underlying abscess, DVT negative -Started on sodium thiosulfate with HD per nephrology. -Continue low-dose oxycodone and Lyrica cautiously -6/16 fentanyl patch 25 mcg.  Monitor closely for oversedation.. -6/19 consult nutrition: Wife would like information on  a diet to help control calciphylaxis.    Sepsis due to RLE cellulitis- -Sepsis physiology resolved except for leukocytosis.  Most likely secondary to patient's calciphylaxis -Completed antibiotic course  with vancomycin and cefepime.    Acute toxic encephalopathy, -Resolved: -5/29 received Narcan with improvement in his mentation. -Minimize opiates or other sedating medications   ESRD on HD M/W/F:  -Dialysis per nephrology -6/17 ready for discharge from nephrology standpoint   DM type II uncontrolled with hyperglycemia -5/28 hemoglobin A1c= 9.7 - Lantus 15 units daily -6/17 NovoLog 8 units qac -6/17 sensitive SSI   Hypothyroidism:  -TSH 8.6 -Continue Synthroid -recheck thyroid studies in 4 weeks   Lower extremity weakness/acute on chronic ambulatory dysfunction:  -Right >>> LEFT; appears to be secondary to pain and not a mechanical/impingement issue -MRI C, T and L-spine without acute cord compression.   -MRI pelvis with diffuse edema in the right pelvis more than left.  Neurology signed off.   -Therapy recommended SNF.   -Outpatient neurology follow-up for EMG/nerve conduction study if symptoms persist for > 3 weeks.   OSA:  -Continue CPAP nightly   Legally blind   Leukocytosis: Somewhat persistent.  Not febrile.  Not on a steroid.  At this point, unclear source. -would continue to monitor   Obesity, Class II: Body mass index is 37.1kg/m.     Pressure skin injury: Pressure Injury 12/11/20 Sacrum Right;Left;Mid Stage 2 -  Partial thickness loss of dermis presenting as a shallow open injury with a red, pink wound bed without slough. (Active)  12/11/20 0551  Location: Sacrum  Location Orientation: Right;Left;Mid  Staging: Stage 2 -  Partial thickness loss of dermis presenting as a shallow open injury with a red, pink wound bed without slough.  Wound Description (Comments):  Present on Admission:     Discharge Instructions  Discharge Instructions     Ambulatory referral to Neurology   Complete by: As directed    An appointment is requested in approximately 4-6 weeks for EMG/NCS, c/f meralgia paraesthetica vs. lumbosarcal plexopathy   Diet - low sodium heart  healthy   Complete by: As directed    Diet - low sodium heart healthy   Complete by: As directed    Discharge wound care:   Complete by: As directed    Wound care to right LE (pretibial) full thickness wounds:  Cleanse with soap and water, rinse and pat dry. Cover affected are with folded piece of xeroform gauze Kellie Simmering # 294), top with dry gauze, ABD and secure with a few turns of Kerlix roll gauze.  Change twice daily. Place foot into Prevalon Boot   Discharge wound care:   Complete by: As directed    Wound care to right LE (pretibial) full thickness wounds:  Cleanse with soap and water, rinse and pat dry. Cover affected are with folded piece of xeroform gauze Kellie Simmering # 294), top with dry gauze, ABD and secure with a few turns of Kerlix roll gauze.  Change twice daily. Place foot into Prevalon Boot   Increase activity slowly   Complete by: As directed    Increase activity slowly   Complete by: As directed       Allergies as of 12/31/2020       Reactions   Morphine Other (See Comments)   Per son, [name], pt becomes unresponsive/disoriented. Especially when given after HD tx   Liraglutide Diarrhea   Phenol Phenol        Medication List     STOP  taking these medications    amLODipine 2.5 MG tablet Commonly known as: NORVASC   calcium acetate 667 MG capsule Commonly known as: PHOSLO   cholestyramine 4 g packet Commonly known as: QUESTRAN   ergocalciferol 1.25 MG (50000 UT) capsule Commonly known as: VITAMIN D2   gabapentin 100 MG capsule Commonly known as: NEURONTIN   hydrOXYzine 25 MG tablet Commonly known as: ATARAX/VISTARIL   isosorbide mononitrate 30 MG 24 hr tablet Commonly known as: IMDUR   metoprolol tartrate 50 MG tablet Commonly known as: LOPRESSOR   sodium bicarbonate 650 MG tablet       TAKE these medications    amiodarone 400 MG tablet Commonly known as: PACERONE Take 1 tablet (400 mg total) by mouth 2 (two) times daily. What changed:   medication strength how much to take when to take this   apixaban 5 MG Tabs tablet Commonly known as: ELIQUIS Take 1 tablet (5 mg total) by mouth 2 (two) times daily. What changed:  medication strength how much to take   atorvastatin 40 MG tablet Commonly known as: LIPITOR Take 40 mg by mouth daily.   Basaglar KwikPen 100 UNIT/ML Inject 15 Units into the skin at bedtime. What changed: how much to take   cholestyramine light 4 g packet Commonly known as: PREVALITE Take 1 packet (4 g total) by mouth every 12 (twelve) hours.   clopidogrel 75 MG tablet Commonly known as: PLAVIX Take 75 mg by mouth daily.   Digoxin 62.5 MCG Tabs Take 0.0625 mg by mouth every other day. Start taking on: January 01, 2021   docusate sodium 100 MG capsule Commonly known as: COLACE Take 1 capsule (100 mg total) by mouth 2 (two) times daily as needed for mild constipation.   fentaNYL 25 MCG/HR Commonly known as: [place] 1 patch onto the skin every 3 (three) days. Start taking on: January 01, 2021   insulin lispro 100 UNIT/ML KwikPen Commonly known as: HUMALOG Inject into the skin. Inject two Units to twelve Units into the skin 15 (fifteen) minutes before meals for 30 days. 180-200 = 2 units 201-250 = 5 units 251-300 = 7 units 301-400 = 12 units and call MD   latanoprost 0.005 % ophthalmic solution Commonly known as: XALATAN Place 1 drop into both eyes at bedtime.   levothyroxine 25 MCG tablet Commonly known as: SYNTHROID Take 12.5 mcg by mouth daily before breakfast.   lidocaine 5 % ointment Commonly known as: XYLOCAINE Apply topically 3 (three) times daily.   metoprolol succinate 25 MG 24 hr tablet Commonly known as: TOPROL-XL Take 0.5 tablets (12.5 mg total) by mouth daily.   midodrine 10 MG tablet Commonly known as: PROAMATINE Take 1 tablet (10 mg total) by mouth 3 (three) times daily with meals.   multivitamin Tabs tablet Take 1 tablet by mouth at bedtime.   mupirocin  ointment 2 % Commonly known as: BACTROBAN Apply 1 application topically as directed. Apply 2-3 times daily   nitroGLYCERIN 0.3 MG SL tablet Commonly known as: NITROSTAT Place 0.3 mg under the tongue every 5 (five) minutes as needed for chest pain.   oxyCODONE 5 MG immediate release tablet Commonly known as: Oxy IR/ROXICODONE Take 1 tablet (5 mg total) by mouth every 12 (twelve) hours as needed for severe pain.   pantoprazole 40 MG tablet Commonly known as: PROTONIX Take 1 tablet by mouth daily.   polyethylene glycol 17 g packet Commonly known as: MIRALAX / GLYCOLAX Take 17 g by mouth daily as  needed for moderate constipation.   pregabalin 25 MG capsule Commonly known as: LYRICA Take 1 capsule (25 mg total) by mouth daily.   sevelamer carbonate 800 MG tablet Commonly known as: RENVELA Take 2 tablets (1,600 mg total) by mouth 3 (three) times daily with meals.               Discharge Care Instructions  (From admission, onward)           Start     Ordered   12/31/20 0000  Discharge wound care:       Comments: Wound care to right LE (pretibial) full thickness wounds:  Cleanse with soap and water, rinse and pat dry. Cover affected are with folded piece of xeroform gauze Kellie Simmering # 294), top with dry gauze, ABD and secure with a few turns of Kerlix roll gauze.  Change twice daily. Place foot into Prevalon Boot   12/31/20 1154   12/30/20 0000  Discharge wound care:       Comments: Wound care to right LE (pretibial) full thickness wounds:  Cleanse with soap and water, rinse and pat dry. Cover affected are with folded piece of xeroform gauze Kellie Simmering # 294), top with dry gauze, ABD and secure with a few turns of Kerlix roll gauze.  Change twice daily. Place foot into Prevalon Boot   12/30/20 1300            Contact information for follow-up providers     [facility].   Specialty: Cardiology Why: 01/06/21 @ 10:00AM with Maximino Greenland,  NP Contact information: [address] [id number] [place] [phone]             Contact information for after-discharge care     Destination     [facility] Preferred SNF .   Service: Skilled Nursing Contact information: [address] [address] [phone]                    Allergies  Allergen Reactions   Morphine Other (See Comments)    Per son, [name], pt becomes unresponsive/disoriented. Especially when given after HD tx   Liraglutide Diarrhea    Phenol Phenol     Consultations: Nephrology Electrophysiology Infectious disease Gen surgery for skin biopsy   Procedures/Studies: DG Chest 1 View  Result Date: 12/03/2020 CLINICAL DATA:  Leg pain Suspected sepsis EXAM: CHEST  1 VIEW COMPARISON:  12/01/2020 FINDINGS: Evaluation of the chest limited due to expiratory phase of imaging. Unchanged cardiomegaly. Mild pulmonary vascular congestion is unchanged. Left IJ dialysis catheter terminates in the right atrium, unchanged from prior exam. Right chest wall AICD unchanged in configuration. Sternal fixation wires and hardware again seen. Lungs are clear. IMPRESSION: Unchanged cardiomegaly and pulmonary vascular congestion. Electronically Signed   By: Miachel Roux M.D.   On: 12/03/2020 14:45   DG Tibia/Fibula Right  Result Date: 12/03/2020 CLINICAL DATA:  Right leg pain. EXAM: RIGHT TIBIA AND FIBULA - 2 VIEW COMPARISON:  None. FINDINGS: There is no evidence of fracture or other focal bone lesions. Vascular calcifications are noted. IMPRESSION: No acute abnormality is noted. Electronically Signed   By: Marijo Conception M.D.   On: 12/03/2020 14:47   MR CERVICAL SPINE WO CONTRAST  Result Date: 12/08/2020 CLINICAL DATA:  Worsening bilateral lower extremity weakness EXAM: MRI CERVICAL, THORACIC AND LUMBAR SPINE WITHOUT CONTRAST TECHNIQUE: Multiplanar and multiecho pulse sequences of the cervical  spine, to include  the craniocervical junction and cervicothoracic junction, and thoracic and lumbar spine, were obtained without intravenous contrast. COMPARISON:  None. FINDINGS: MRI CERVICAL SPINE Alignment: No significant listhesis. Vertebrae: Vertebral body heights are maintained. No substantial marrow edema. No suspicious osseous lesion. Cord: No abnormal signal. Posterior Fossa, vertebral arteries, paraspinal tissues: Unremarkable. Disc levels: C2-C3: Left facet hypertrophy. No canal or right foraminal stenosis. Moderate left foraminal stenosis. C3-C4: Disc bulge with endplate osteophytes. Left facet hypertrophy. No canal or right foraminal stenosis. Moderate to marked left foraminal stenosis. C4-C5: Disc bulge with endplate osteophytes. Right facet joint effusion. Left facet hypertrophy. No canal stenosis. No foraminal stenosis. C5-C6: Disc bulge with superimposed central disc protrusion and endplate osteophytes. Left facet hypertrophy. No canal or foraminal stenosis. C6-C7: Disc bulge with endplate osteophytes. No canal or foraminal stenosis. C7-T1: Endplate osteophytes. No canal or right foraminal stenosis. Minor left foraminal stenosis. MRI THORACIC SPINE Motion artifact is present. Alignment: Preserved. Vertebrae: Multilevel degenerative endplate irregularity. Chronic appearing degenerative endplate marrow changes, particularly at T5-T6 to T7-T8. No substantial marrow edema. No suspicious osseous lesion. Cord:  No abnormal signal within above limitation. Paraspinal and other soft tissues: Unremarkable. Disc levels: Mild multilevel degenerative disc disease with minor disc bulges or protrusions. Mild facet arthropathy. No high-grade degenerative stenosis. MRI LUMBAR SPINE Motion artifact is present particularly on axial imaging. Segmentation: Standard. Alignment:  Trace retrolisthesis at L3-L4. Vertebrae: Vertebral body heights are maintained apart from small Schmorl's nodes at the superior and inferior L3  on plates with minor associated marrow edema. No suspicious osseous lesion. Conus medullaris and cauda equina: Conus extends to the T12-L1 level. Conus and cauda equina appear normal. Paraspinal and other soft tissues: Atrophy of the inferior left psoas. Disc levels: L1-L2:  No canal or foraminal stenosis. L2-L3:  No canal or foraminal stenosis. L3-L4: Disc bulge. No canal stenosis. No right foraminal stenosis. Minor left foraminal stenosis. L4-L5: Disc bulge with endplate osteophytic ridging eccentric to the left. Facet arthropathy. No canal or right foraminal stenosis. Mild to moderate left foraminal stenosis. L5-S1: Small right subarticular disc protrusion. Facet arthropathy. No canal stenosis. Slight effacement of the right subarticular recess. No foraminal stenosis. IMPRESSION: No abnormal cord signal. No high-grade canal stenosis. No evidence of discitis/osteomyelitis or epidural collection. Multilevel degenerative changes as detailed above. Electronically Signed   By: Macy Mis M.D.   On: 12/08/2020 18:16   MR THORACIC SPINE WO CONTRAST  Result Date: 12/08/2020 CLINICAL DATA:  Worsening bilateral lower extremity weakness EXAM: MRI CERVICAL, THORACIC AND LUMBAR SPINE WITHOUT CONTRAST TECHNIQUE: Multiplanar and multiecho pulse sequences of the cervical spine, to include the craniocervical junction and cervicothoracic junction, and thoracic and lumbar spine, were obtained without intravenous contrast. COMPARISON:  None. FINDINGS: MRI CERVICAL SPINE Alignment: No significant listhesis. Vertebrae: Vertebral body heights are maintained. No substantial marrow edema. No suspicious osseous lesion. Cord: No abnormal signal. Posterior Fossa, vertebral arteries, paraspinal tissues: Unremarkable. Disc levels: C2-C3: Left facet hypertrophy. No canal or right foraminal stenosis. Moderate left foraminal stenosis. C3-C4: Disc bulge with endplate osteophytes. Left facet hypertrophy. No canal or right foraminal  stenosis. Moderate to marked left foraminal stenosis. C4-C5: Disc bulge with endplate osteophytes. Right facet joint effusion. Left facet hypertrophy. No canal stenosis. No foraminal stenosis. C5-C6: Disc bulge with superimposed central disc protrusion and endplate osteophytes. Left facet hypertrophy. No canal or foraminal stenosis. C6-C7: Disc bulge with endplate osteophytes. No canal or foraminal stenosis. C7-T1: Endplate osteophytes. No canal or right foraminal stenosis. Minor left foraminal stenosis. MRI THORACIC  SPINE Motion artifact is present. Alignment: Preserved. Vertebrae: Multilevel degenerative endplate irregularity. Chronic appearing degenerative endplate marrow changes, particularly at T5-T6 to T7-T8. No substantial marrow edema. No suspicious osseous lesion. Cord:  No abnormal signal within above limitation. Paraspinal and other soft tissues: Unremarkable. Disc levels: Mild multilevel degenerative disc disease with minor disc bulges or protrusions. Mild facet arthropathy. No high-grade degenerative stenosis. MRI LUMBAR SPINE Motion artifact is present particularly on axial imaging. Segmentation: Standard. Alignment:  Trace retrolisthesis at L3-L4. Vertebrae: Vertebral body heights are maintained apart from small Schmorl's nodes at the superior and inferior L3 on plates with minor associated marrow edema. No suspicious osseous lesion. Conus medullaris and cauda equina: Conus extends to the T12-L1 level. Conus and cauda equina appear normal. Paraspinal and other soft tissues: Atrophy of the inferior left psoas. Disc levels: L1-L2:  No canal or foraminal stenosis. L2-L3:  No canal or foraminal stenosis. L3-L4: Disc bulge. No canal stenosis. No right foraminal stenosis. Minor left foraminal stenosis. L4-L5: Disc bulge with endplate osteophytic ridging eccentric to the left. Facet arthropathy. No canal or right foraminal stenosis. Mild to moderate left foraminal stenosis. L5-S1: Small right subarticular  disc protrusion. Facet arthropathy. No canal stenosis. Slight effacement of the right subarticular recess. No foraminal stenosis. IMPRESSION: No abnormal cord signal. No high-grade canal stenosis. No evidence of discitis/osteomyelitis or epidural collection. Multilevel degenerative changes as detailed above. Electronically Signed   By: Macy Mis M.D.   On: 12/08/2020 18:16   MR LUMBAR SPINE WO CONTRAST  Result Date: 12/08/2020 CLINICAL DATA:  Worsening bilateral lower extremity weakness EXAM: MRI CERVICAL, THORACIC AND LUMBAR SPINE WITHOUT CONTRAST TECHNIQUE: Multiplanar and multiecho pulse sequences of the cervical spine, to include the craniocervical junction and cervicothoracic junction, and thoracic and lumbar spine, were obtained without intravenous contrast. COMPARISON:  None. FINDINGS: MRI CERVICAL SPINE Alignment: No significant listhesis. Vertebrae: Vertebral body heights are maintained. No substantial marrow edema. No suspicious osseous lesion. Cord: No abnormal signal. Posterior Fossa, vertebral arteries, paraspinal tissues: Unremarkable. Disc levels: C2-C3: Left facet hypertrophy. No canal or right foraminal stenosis. Moderate left foraminal stenosis. C3-C4: Disc bulge with endplate osteophytes. Left facet hypertrophy. No canal or right foraminal stenosis. Moderate to marked left foraminal stenosis. C4-C5: Disc bulge with endplate osteophytes. Right facet joint effusion. Left facet hypertrophy. No canal stenosis. No foraminal stenosis. C5-C6: Disc bulge with superimposed central disc protrusion and endplate osteophytes. Left facet hypertrophy. No canal or foraminal stenosis. C6-C7: Disc bulge with endplate osteophytes. No canal or foraminal stenosis. C7-T1: Endplate osteophytes. No canal or right foraminal stenosis. Minor left foraminal stenosis. MRI THORACIC SPINE Motion artifact is present. Alignment: Preserved. Vertebrae: Multilevel degenerative endplate irregularity. Chronic appearing  degenerative endplate marrow changes, particularly at T5-T6 to T7-T8. No substantial marrow edema. No suspicious osseous lesion. Cord:  No abnormal signal within above limitation. Paraspinal and other soft tissues: Unremarkable. Disc levels: Mild multilevel degenerative disc disease with minor disc bulges or protrusions. Mild facet arthropathy. No high-grade degenerative stenosis. MRI LUMBAR SPINE Motion artifact is present particularly on axial imaging. Segmentation: Standard. Alignment:  Trace retrolisthesis at L3-L4. Vertebrae: Vertebral body heights are maintained apart from small Schmorl's nodes at the superior and inferior L3 on plates with minor associated marrow edema. No suspicious osseous lesion. Conus medullaris and cauda equina: Conus extends to the T12-L1 level. Conus and cauda equina appear normal. Paraspinal and other soft tissues: Atrophy of the inferior left psoas. Disc levels: L1-L2:  No canal or foraminal stenosis. L2-L3:  No canal  or foraminal stenosis. L3-L4: Disc bulge. No canal stenosis. No right foraminal stenosis. Minor left foraminal stenosis. L4-L5: Disc bulge with endplate osteophytic ridging eccentric to the left. Facet arthropathy. No canal or right foraminal stenosis. Mild to moderate left foraminal stenosis. L5-S1: Small right subarticular disc protrusion. Facet arthropathy. No canal stenosis. Slight effacement of the right subarticular recess. No foraminal stenosis. IMPRESSION: No abnormal cord signal. No high-grade canal stenosis. No evidence of discitis/osteomyelitis or epidural collection. Multilevel degenerative changes as detailed above. Electronically Signed   By: Macy Mis M.D.   On: 12/08/2020 18:16   MR PELVIS WO CONTRAST  Result Date: 12/08/2020 CLINICAL DATA:  Nontraumatic lumbosacral plexopathy. Worsening ability to move the lower extremities with right lateral thigh pain. EXAM: MRI PELVIS WITHOUT CONTRAST TECHNIQUE: Multiplanar multisequence MR imaging of the  pelvis was performed. No intravenous contrast was administered. COMPARISON:  Pelvic CT 09/15/2020.  Lumbar MRI 12/08/2020. FINDINGS: Bones/Joint/Cartilage The bony pelvis appears normal, without evidence of acute fracture, dislocation or femoral head avascular necrosis. The hip and sacroiliac joints appear normal without effusions, erosions or subchondral edema. Lower lumbar spine findings are dictated separately. Ligaments Not relevant for exam/indication. Muscles and Tendons There is mild generalized edema throughout the pelvic and proximal thigh musculature. No focal fluid collection or atrophy is seen. There is mild gluteus tendinosis bilaterally with associated mild trochanteric bursal fluid. The common hamstring tendons are intact. Soft tissues There is generalized subcutaneous edema, especially in the low anterior pelvic wall and lateral thighs bilaterally. There is also edema throughout the internal pelvic fat. A small amount of free pelvic fluid is present. No evidence of neurovascular abnormality. IMPRESSION: 1. Nonspecific generalized soft tissue edema involving the internal and subcutaneous fat as well as the pelvic and proximal thigh musculature. The diffuse nature of this edema suggests anasarca. Correlate clinically. No focal fluid collection identified. 2. No acute osseous findings or significant arthropathic changes. 3. No evidence of neurovascular abnormality. Electronically Signed   By: Richardean Sale M.D.   On: 12/08/2020 20:24   CT FEMUR RIGHT WO CONTRAST  Result Date: 12/05/2020 CLINICAL DATA:  Limping, right leg pain EXAM: CT OF THE LOWER RIGHT EXTREMITY WITHOUT CONTRAST TECHNIQUE: Multidetector CT imaging of the right lower extremity was performed according to the standard protocol. COMPARISON:  None. FINDINGS: Bones/Joint/Cartilage No fracture or dislocation. Normal alignment. No joint effusion. Left hip joint space is maintained. Knee joint spaces are maintained. Degenerative changes  of the pubic symphysis. 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 Ligaments Ligaments are suboptimally evaluated by CT. Muscles and Tendons Muscles are normal.  No intramuscular fluid collection or hematoma. Soft tissue No fluid collection or hematoma. No soft tissue mass. Soft tissue edema and skin thickening along the lateral aspect of the upper right thigh and circumferential subcutaneous edema throughout the remainder of the thigh extending into the lower leg. Extensive peripheral vascular atherosclerotic disease. IMPRESSION: 1.  No acute osseous injury of the right femur. 2. Soft tissue edema and skin thickening along the lateral aspect of the upper right thigh and circumferential subcutaneous edema throughout the remainder of the thigh extending into the lower leg. This likely reflects reactive edema secondary to fluid overload and less likely cellulitis. 3. No drainable fluid collections. Electronically Signed   By: Kathreen Devoid   On: 12/05/2020 10:49   DG CHEST PORT 1 VIEW  Result Date: 12/30/2020 CLINICAL DATA:  Shortness of breath, diabetes mellitus, mi, CABG EXAM: PORTABLE CHEST 1 VIEW COMPARISON:  Portable exam 1018  hours compared to 12/05/2020 FINDINGS: LEFT jugular dual-lumen central venous catheter with tip projecting over RIGHT atrium. RIGHT subclavian ICD with leads projecting over RIGHT atrium and RIGHT ventricle. Enlargement of cardiac silhouette with slight pulmonary vascular congestion post CABG. Decreased lung volumes with bibasilar atelectasis. No acute failure or consolidation. No pleural effusion or pneumothorax. IMPRESSION: Enlargement of cardiac silhouette with pulmonary vascular congestion. Bibasilar atelectasis. Electronically Signed   By: Lavonia Dana M.D.   On: 12/30/2020 12:32   DG CHEST PORT 1 VIEW  Result Date: 12/05/2020 CLINICAL DATA:  Shortness of breath EXAM: PORTABLE CHEST 1 VIEW COMPARISON:  Dec 03, 2020 FINDINGS: The cardiomediastinal silhouette is unchanged and  enlarged in contour.RIGHT chest AICD. Status post median sternotomy and CABG. LEFT IJ CVC tip terminates over the RIGHT atrium. No pleural effusion. No pneumothorax. Scattered linear opacities consistent with atelectasis. Perihilar vascular fullness without overt edema. Visualized abdomen is unremarkable. Multilevel degenerative changes of the thoracic spine. IMPRESSION: Cardiomegaly with pulmonary vascular congestion without overt edema. Electronically Signed   By: Valentino Saxon MD   On: 12/05/2020 14:42   DG Hip Unilat W or Wo Pelvis 2-3 Views Right  Result Date: 12/03/2020 CLINICAL DATA:  Right leg pain. EXAM: DG HIP (WITH OR WITHOUT PELVIS) 2-3V RIGHT COMPARISON:  None. FINDINGS: There is no evidence of hip fracture or dislocation. There is no evidence of arthropathy or other focal bone abnormality. IMPRESSION: Negative. Electronically Signed   By: Marijo Conception M.D.   On: 12/03/2020 14:46   VAS [name] LOWER EXTREMITY VENOUS (DVT)  Result Date: 12/05/2020  Lower Venous DVT Study Patient Name:  Kyle Walton  Date of Exam:   12/05/2020 Medical Rec #: [id number]      Accession #:    [id number] Date of Birth: [birth year]      Patient Gender: M Patient Age:   65Y Exam Location:  [facility] Procedure:      VAS [name] LOWER EXTREMITY VENOUS (DVT) Referring Phys: [place] --------------------------------------------------------------------------------  Indications: Swelling, and Erythema.  Limitations: Edema. Comparison Study: No prior study on file Performing Technologist: Sharion Dove RVS  Examination Guidelines: A complete evaluation includes B-mode imaging, spectral Doppler, color Doppler, and power Doppler as needed of all accessible portions of each vessel. Bilateral testing is considered an integral part of a complete examination. Limited examinations for reoccurring indications may be performed as noted. The reflux portion of the exam is performed with the patient in reverse  Trendelenburg.  +---------+---------------+---------+-----------+----------+-------------------+ RIGHT    CompressibilityPhasicitySpontaneityPropertiesThrombus Aging      +---------+---------------+---------+-----------+----------+-------------------+ CFV      Full                                         pulsatile           +---------+---------------+---------+-----------+----------+-------------------+ SFJ      Full                                                             +---------+---------------+---------+-----------+----------+-------------------+ FV Prox  Full                                                             +---------+---------------+---------+-----------+----------+-------------------+  FV Mid   Full                                                             +---------+---------------+---------+-----------+----------+-------------------+ FV DistalFull                                                             +---------+---------------+---------+-----------+----------+-------------------+ PFV      Full                                                             +---------+---------------+---------+-----------+----------+-------------------+ POP      Full                                         pulsatile           +---------+---------------+---------+-----------+----------+-------------------+ PTV      Full                                                             +---------+---------------+---------+-----------+----------+-------------------+ PERO                                                  Not well visualized +---------+---------------+---------+-----------+----------+-------------------+   +----+---------------+---------+-----------+----------+--------------+ LEFTCompressibilityPhasicitySpontaneityPropertiesThrombus Aging +----+---------------+---------+-----------+----------+--------------+ CFV Full            Yes      No                                  +----+---------------+---------+-----------+----------+--------------+     Summary: RIGHT: - There is no evidence of deep vein thrombosis in the lower extremity. However, portions of this examination were limited- see technologist comments above.  LEFT: - No evidence of common femoral vein obstruction.  *See table(s) above for measurements and observations. Electronically signed by Monica Martinez MD on 12/05/2020 at 5:17:31 PM.    Final       Subjective: Denies any shortness of breath. Feels well. Continues to have some pain in right thigh  Discharge Exam: Vitals:   12/30/20 1833 12/30/20 2045 12/31/20 0541 12/31/20 1141  BP: (!) 102/57  (!) 103/54 (!) 105/54  Pulse: (!) 102 (!) 102 88 87  Resp: '20 18 18 18  '$ Temp: 98 F (36.7 C)  (!) 97.5 F (36.4 C) 98.1 F (36.7 C)  TempSrc:   Oral Oral  SpO2: 99% 97% 95% 96%  Weight:      Height:  General: Pt is alert, awake, not in acute distress Cardiovascular: RRR, S1/S2 +, no rubs, no gallops Respiratory: CTA bilaterally, no wheezing, no rhonchi Abdominal: Soft, NT, ND, bowel sounds + Extremities: 1+ edema, right thigh is tender to touch    The results of significant diagnostics from this hospitalization (including imaging, microbiology, ancillary and laboratory) are listed below for reference.     Microbiology: Recent Results (from the past 240 hour(s))  SARS CORONAVIRUS 2 (TAT 6-24 HRS) Nasopharyngeal Nasopharyngeal Swab     Status: None   Collection Time: 12/29/20  4:46 PM   Specimen: Nasopharyngeal Swab  Result Value Ref Range Status   SARS Coronavirus 2 NEGATIVE NEGATIVE Final    Comment: (NOTE) SARS-CoV-2 target nucleic acids are NOT DETECTED.  The SARS-CoV-2 RNA is generally detectable in upper and lower respiratory specimens during the acute phase of infection. Negative results do not preclude SARS-CoV-2 infection, do not rule out co-infections with other  pathogens, and should not be used as the sole basis for treatment or other patient management decisions. Negative results must be combined with clinical observations, patient history, and epidemiological information. The expected result is Negative.  Fact Sheet for Patients: SugarRoll.be  Fact Sheet for Healthcare Providers: https://www.woods-mathews.com/  This test is not yet approved or cleared by the [place] FDA and  has been authorized for detection and/or diagnosis of SARS-CoV-2 by FDA under an Emergency Use Authorization (EUA). This EUA will remain  in effect (meaning this test can be used) for the duration of the COVID-19 declaration under Se ction 564(b)(1) of the Act, 21 U.S.C. section 360bbb-3(b)(1), unless the authorization is terminated or revoked sooner.  Performed at [facility], [place] [address], [place],  [address]      Labs: BNP (last 3 results) No results for input(s): BNP in the last 8760 hours. Basic Metabolic Panel: Recent Labs  Lab 12/25/20 0430 12/26/20 0244 12/27/20 0352 12/28/20 0723 12/29/20 0404 12/30/20 0338 12/31/20 0349  NA 141 141 140 139  --   --   --   K 4.1 4.4 4.5 3.7  --   --   --   CL 97* 97* 99 96*  --   --   --   CO2 '25 24 22 23  '$ --   --   --   GLUCOSE 160* 133* 148* 115*  --   --   --   BUN 21 29* 40* 23  --   --   --   CREATININE 3.94* 4.96* 6.07* 4.17*  --   --   --   CALCIUM 9.1 9.4 8.9 8.3*  --   --   --   MG 2.0 1.9 1.9 1.7 1.8 1.8 1.9  PHOS 2.6 4.2 5.2* 3.6 4.7* 3.8 4.7*   Liver Function Tests: Recent Labs  Lab 12/25/20 0430 12/26/20 0244 12/27/20 0352 12/28/20 0723  AST 37 36 55* 52*  ALT '9 9 12 12  '$ ALKPHOS 149* 136* 228* 190*  BILITOT 1.0 0.8 1.1 1.0  PROT 6.6 6.9 6.8 6.4*  ALBUMIN 2.3* 2.4* 2.4* 2.3*   No results for input(s): LIPASE, AMYLASE in the last 168 hours. No results for input(s): AMMONIA in the last 168 hours. CBC: Recent Labs  Lab  12/27/20 0352 12/28/20 0723 12/29/20 0404 12/30/20 0338 12/31/20 0349  WBC 14.6* 12.7* 13.4* 14.3* 14.6*  NEUTROABS 11.6* 9.8* 10.6* 11.7* 11.4*  HGB 10.2* 10.0* 10.1* 9.6* 9.3*  HCT 33.6* 32.7* 32.5* 31.3* 30.9*  MCV 92.6 91.3  91.5 90.7 91.7  PLT 317 276 271 251 249   Cardiac Enzymes: No results for input(s): CKTOTAL, CKMB, CKMBINDEX, TROPONINI in the last 168 hours. BNP: Invalid input(s): POCBNP CBG: Recent Labs  Lab 12/30/20 2206 12/31/20 0138 12/31/20 0533 12/31/20 0758 12/31/20 1158  GLUCAP 127* 104* 160* 143* 241*   D-Dimer No results for input(s): DDIMER in the last 72 hours. Hgb A1c No results for input(s): HGBA1C in the last 72 hours. Lipid Profile No results for input(s): CHOL, HDL, LDLCALC, TRIG, CHOLHDL, LDLDIRECT in the last 72 hours. Thyroid function studies No results for input(s): TSH, T4TOTAL, T3FREE, THYROIDAB in the last 72 hours.  Invalid input(s): FREET3 Anemia work up No results for input(s): VITAMINB12, FOLATE, FERRITIN, TIBC, IRON, RETICCTPCT in the last 72 hours. Urinalysis No results found for: COLORURINE, APPEARANCEUR, [place], [place], GLUCOSEU, [place], [place], [place], PROTEINUR, UROBILINOGEN, NITRITE, LEUKOCYTESUR Sepsis Labs Invalid input(s): PROCALCITONIN,  WBC,  LACTICIDVEN Microbiology Recent Results (from the past 240 hour(s))  SARS CORONAVIRUS 2 (TAT 6-24 HRS) Nasopharyngeal Nasopharyngeal Swab     Status: None   Collection Time: 12/29/20  4:46 PM   Specimen: Nasopharyngeal Swab  Result Value Ref Range Status   SARS Coronavirus 2 NEGATIVE NEGATIVE Final    Comment: (NOTE) SARS-CoV-2 target nucleic acids are NOT DETECTED.  The SARS-CoV-2 RNA is generally detectable in upper and lower respiratory specimens during the acute phase of infection. Negative results do not preclude SARS-CoV-2 infection, do not rule out co-infections with other pathogens, and should not be used as the sole basis for treatment or other patient  management decisions. Negative results must be combined with clinical observations, patient history, and epidemiological information. The expected result is Negative.  Fact Sheet for Patients: SugarRoll.be  Fact Sheet for Healthcare Providers: https://www.woods-mathews.com/  This test is not yet approved or cleared by the [place] FDA and  has been authorized for detection and/or diagnosis of SARS-CoV-2 by FDA under an Emergency Use Authorization (EUA). This EUA will remain  in effect (meaning this test can be used) for the duration of the COVID-19 declaration under Se ction 564(b)(1) of the Act, 21 U.S.C. section 360bbb-3(b)(1), unless the authorization is terminated or revoked sooner.  Performed at [facility], [place] [address] [place], [place] [address]      Time coordinating discharge: 2mns  SIGNED:   JKathie Dike MD  [facility] 12/31/2020, 1:30 PM   If 7PM-7AM, please contact night-coverage [email or web address]

## 2020-12-30 NOTE — Progress Notes (Signed)
Informed by staff that patient stated that he is unable to sit up in a chair and would be unable to have outpatient dialysis. Patient stated that he has been receiving dialysis in his bed and due to pain related issues, is unable to sit up in a chair for a prolonged period of time. Will ask staff to try and have patient start sitting in a chair and we can adjust pain medications based on his needs.  Kyle Walton

## 2020-12-30 NOTE — TOC Transition Note (Addendum)
Transition of Care [facility]) - CM/SW Discharge Note *Discharged to [facility] - 12/31/20 *Room 107A *Phone number for report: [phone]    Patient Details  Name: Josephus Cruthirds MRN: [id number] Date of Birth: [birth year]  Transition of Care [facility]) CM/SW Contact:  Sable Feil, LCSW Phone Number: 12/30/2020, 4:39 PM   Clinical Narrative:  Patient medically stable for discharge and insurance approval received from [place]. Patient discharging to [facility] and discharge clinicals transmitted to facility   [facility] contacted this morning and HD transport arranged to get him from [facility] to [place] in [place].  [facility] has hoyer pads that will be in the wheelchair he is transported in so that once at the HD Center, he can be safely lifted into the HD chair. Patient must be ready by 9:45 am. [facility] will also need to call a week before he is ready for d/c from [facility] to report his going home - [phone]. This information given to [facility], admissions Mudlogger.  Mrs. Devario Tison contacted [phone]) regarding discharge and things that will be put in place to get him more comfortably from the SNF to dialysis.    12/31/20: Patient did not discharge on the 23rd. Call was made to [facility] and HD transport cancelled, and person informed that patient will discharge to [facility] today. Wife Delcie Roch [phone]) contacted and updated. Discharge summary and After-Visit-Summary transmitted to facility. Mr. Schleeter will be transported to [facility] by non-emergency ambulance transport.   5:41 pm: Call made to patient's wife-Naomi Puerto [phone]) and message left regarding her husband's discharge today to [facility].     Final next level of care: [facility]) Barriers to Discharge: Barriers Resolved   Patient Goals and CMS Choice Patient states their goals  for this hospitalization and ongoing recovery are:: Patient agreeable to [place] rehab CMS Medicare.gov Compare Post Acute Care list provided to:: Other (Comment Required) (Not provided) Choice offered to / list presented to :  (Not provided)  Discharge Placement   Existing PASRR number confirmed : 12/09/20          Patient chooses bed at: [facility] Patient to be transferred to facility by: Non-emergency ambulance transport Name of family member notified: Wife Ryland Michalski [phone] Patient and family notified of of transfer: 12/30/20  Discharge Plan and Services                                     Social Determinants of Health (SDOH) Interventions  No SDOH interventions requested or needed at discharge.   Readmission Risk Interventions No flowsheet data found.

## 2020-12-30 NOTE — Progress Notes (Signed)
Physical Therapy Treatment Patient Details Name: Kyle Walton MRN: [id number] DOB: 08-21-1955 Today's Date: 12/30/2020    History of Present Illness Pt is a 65 y.o. male who presented 5/27 with R leg cellulitis and sepsis. PMH: PAF, OSA, MI, ESRD on HD MWF, DM on insulin, s/p multiple toe amputations, morbid obesity, ESBL/VRE/MRSA and CAD.    PT Comments    Pt in bed sitting in stool on entry. Pt advised that he needs to be using bed pan to decrease skin break down. Pt refuses due to pain. While cleaning pt up, noted to have breakdown of scrotum and perianal tissue. RN advised. Pt with strong personality, limiting his ability to follow direction, as a result today pt requires total A to come to EoB. And despite multiple attempts in not able to come to standing with Ax2. D/c plans remain appropriate at this time. PT will continue to follow acutely.     Follow Up Recommendations  SNF     Equipment Recommendations  3in1 (PT);Wheelchair (measurements PT);Wheelchair cushion (measurements PT);Hospital bed;Other (comment) Metallurgist)       Precautions / Restrictions Precautions Precautions: Fall;Other (comment) Precaution Comments: contact precautions; Also noteworthy that lateral aspect of R thigh is very painful and sensitive to even light touch Restrictions Weight Bearing Restrictions: No Other Position/Activity Restrictions: h/o multiple toe amputations bilat    Mobility  Bed Mobility Overal bed mobility: Needs Assistance Bed Mobility: Rolling;Sidelying to Sit Rolling: Supervision (and rail) Sidelying to sit: Total assist     Sit to sidelying: Max assist General bed mobility comments: pt wants to use bed to bring his head up, therapist advises against it and request pt try to push up from bed surface. Pt only continues to raise HoB with bed controls on rail. Once HoB fully upright refuses assist from therapist and when he attempts to right his trunk he falls backwards in bed,  then requires total A for bringing trunk to upright.    Transfers Overall transfer level: Needs assistance Equipment used: 2 person hand held assist Transfers: Sit to/from Stand Sit to Stand: Total assist;+2 physical assistance         General transfer comment: pt noted to have good UE strength in prior PT session. To maximize power up, recliner placed with back to bed so that pt could pull up on it. Pt provided with maximal cuing for proper mechanics to extend trunk in coming to upright which pt did not follow. Attempted x5 and pt unable to do more that clear hips from bed surface.        Balance Overall balance assessment: Needs assistance Sitting-balance support: Feet supported Sitting balance-Leahy Scale: Fair Sitting balance - Comments: able to static sit and participate in seated exercise       Standing balance comment: unable today                            Cognition Arousal/Alertness: Awake/alert Behavior During Therapy: WFL for tasks assessed/performed;Anxious Overall Cognitive Status: Within Functional Limits for tasks assessed                                 General Comments: Pt very upset about recommendation for SNF, pt is sure that he can get up and his wife can help him         General Comments General comments (skin integrity, edema, etc.): Pt sitting  in stool on entry, Advised pt that he should be using a bed pan to protect his skin, however pt reports it is too painful, when cleaning pt up scrotum and perianal area noted to have skin breakdown, RN notified      Pertinent Vitals/Pain Pain Assessment: Faces Faces Pain Scale: Hurts even more Pain Location: R lateral leg with movement, R thigh with touch, scrotum due to skin break down Pain Descriptors / Indicators: Grimacing;Guarding;Sharp     PT Goals (current goals can now be found in the care plan section) Acute Rehab PT Goals PT Goal Formulation: With patient Time For  Goal Achievement: 01/04/21 Potential to Achieve Goals: Good    Frequency    Min 2X/week      PT Plan Current plan remains appropriate       AM-PAC PT "6 Clicks" Mobility   Outcome Measure  Help needed turning from your back to your side while in a flat bed without using bedrails?: None Help needed moving from lying on your back to sitting on the side of a flat bed without using bedrails?: A Lot Help needed moving to and from a bed to a chair (including a wheelchair)?: A Lot Help needed standing up from a chair using your arms (e.g., wheelchair or bedside chair)?: A Lot Help needed to walk in hospital room?: Total Help needed climbing 3-5 steps with a railing? : Total 6 Click Score: 12    End of Session Equipment Utilized During Treatment: Gait belt Activity Tolerance: Patient limited by pain Patient left: in bed;with call bell/phone within reach;with bed alarm set Nurse Communication: Mobility status;Need for lift equipment;Other (comment) (skin breakdown of periarea) PT Visit Diagnosis: Muscle weakness (generalized) (M62.81);Difficulty in walking, not elsewhere classified (R26.2);Pain Pain - Right/Left: Right Pain - part of body: Leg     Time: [id number] PT Time Calculation (min) (ACUTE ONLY): 34 min  Charges:  $Therapeutic Activity: 23-37 mins                     Rozanna Cormany B. Migdalia Dk PT, DPT Acute Rehabilitation Services Pager [phone] Office [phone]    [place] 12/30/2020, 9:41 AM

## 2020-12-30 NOTE — Progress Notes (Signed)
SATURATION QUALIFICATIONS: (This note is used to comply with regulatory documentation for home oxygen)  Patient Saturations on Room Air at Rest = 85%  Patient Saturations on 2 Liters of oxygen while at Rest = 98%

## 2020-12-30 NOTE — Care Management Important Message (Signed)
Important Message  Patient Details  Name: Kyle Walton MRN: [id number] Date of Birth: 1956-05-20   Medicare Important Message Given:  Yes - Important Message mailed due to current National Emergency  Verbal consent obtained due to current National Emergency  Relationship to patient: Self Contact Name: Krishal Call Date: 12/30/20  Time: 1518 Phone: [phone] Outcome: Spoke with contact Important Message mailed to: Patient address on file    [name] 12/30/2020, 3:19 PM

## 2020-12-31 LAB — CBC WITH DIFFERENTIAL/PLATELET
Abs Immature Granulocytes: 0.08 10*3/uL — ABNORMAL HIGH (ref 0.00–0.07)
Basophils Absolute: 0.1 10*3/uL (ref 0.0–0.1)
Basophils Relative: 1 %
Eosinophils Absolute: 0.1 10*3/uL (ref 0.0–0.5)
Eosinophils Relative: 1 %
HCT: 30.9 % — ABNORMAL LOW (ref 39.0–52.0)
Hemoglobin: 9.3 g/dL — ABNORMAL LOW (ref 13.0–17.0)
Immature Granulocytes: 1 %
Lymphocytes Relative: 8 %
Lymphs Abs: 1.1 10*3/uL (ref 0.7–4.0)
MCH: 27.6 pg (ref 26.0–34.0)
MCHC: 30.1 g/dL (ref 30.0–36.0)
MCV: 91.7 fL (ref 80.0–100.0)
Monocytes Absolute: 1.8 10*3/uL — ABNORMAL HIGH (ref 0.1–1.0)
Monocytes Relative: 12 %
Neutro Abs: 11.4 10*3/uL — ABNORMAL HIGH (ref 1.7–7.7)
Neutrophils Relative %: 77 %
Platelets: 249 10*3/uL (ref 150–400)
RBC: 3.37 MIL/uL — ABNORMAL LOW (ref 4.22–5.81)
RDW: 17.2 % — ABNORMAL HIGH (ref 11.5–15.5)
WBC: 14.6 10*3/uL — ABNORMAL HIGH (ref 4.0–10.5)
nRBC: 0 % (ref 0.0–0.2)

## 2020-12-31 LAB — RENAL FUNCTION PANEL
Albumin: 2.1 g/dL — ABNORMAL LOW (ref 3.5–5.0)
Anion gap: 16 — ABNORMAL HIGH (ref 5–15)
BUN: 41 mg/dL — ABNORMAL HIGH (ref 8–23)
CO2: 23 mmol/L (ref 22–32)
Calcium: 8.2 mg/dL — ABNORMAL LOW (ref 8.9–10.3)
Chloride: 97 mmol/L — ABNORMAL LOW (ref 98–111)
Creatinine, Ser: 5.94 mg/dL — ABNORMAL HIGH (ref 0.61–1.24)
GFR, Estimated: 10 mL/min — ABNORMAL LOW (ref >60)
Glucose, Bld: 213 mg/dL — ABNORMAL HIGH (ref 70–99)
Phosphorus: 5.3 mg/dL — ABNORMAL HIGH (ref 2.5–4.6)
Potassium: 2.9 mmol/L — ABNORMAL LOW (ref 3.5–5.1)
Sodium: 136 mmol/L (ref 135–145)

## 2020-12-31 LAB — GLUCOSE, CAPILLARY
Glucose-Capillary: 104 mg/dL — ABNORMAL HIGH (ref 70–99)
Glucose-Capillary: 160 mg/dL — ABNORMAL HIGH (ref 70–99)
Glucose-Capillary: 143 mg/dL — ABNORMAL HIGH (ref 70–99)
Glucose-Capillary: 241 mg/dL — ABNORMAL HIGH (ref 70–99)
Glucose-Capillary: 149 mg/dL — ABNORMAL HIGH (ref 70–99)
Glucose-Capillary: 139 mg/dL — ABNORMAL HIGH (ref 70–99)

## 2020-12-31 LAB — CBC
HCT: 29.1 % — ABNORMAL LOW (ref 39.0–52.0)
Hemoglobin: 8.8 g/dL — ABNORMAL LOW (ref 13.0–17.0)
MCH: 27.9 pg (ref 26.0–34.0)
MCHC: 30.2 g/dL (ref 30.0–36.0)
MCV: 92.4 fL (ref 80.0–100.0)
Platelets: 227 10*3/uL (ref 150–400)
RBC: 3.15 MIL/uL — ABNORMAL LOW (ref 4.22–5.81)
RDW: 17.1 % — ABNORMAL HIGH (ref 11.5–15.5)
WBC: 13.7 10*3/uL — ABNORMAL HIGH (ref 4.0–10.5)
nRBC: 0 % (ref 0.0–0.2)

## 2020-12-31 LAB — PHOSPHORUS: Phosphorus: 4.7 mg/dL — ABNORMAL HIGH (ref 2.5–4.6)

## 2020-12-31 LAB — MAGNESIUM: Magnesium: 1.9 mg/dL (ref 1.7–2.4)

## 2020-12-31 MED ORDER — POLYETHYLENE GLYCOL 3350 17 G PO PACK: 17.0000 g | PACK | Freq: Every day | ORAL | 0 refills | Status: AC | PRN

## 2020-12-31 MED ORDER — DOCUSATE SODIUM 100 MG PO CAPS: 100.0000 mg | ORAL_CAPSULE | Freq: Two times a day (BID) | ORAL | 0 refills | Status: AC | PRN

## 2020-12-31 MED ORDER — HEPARIN SODIUM (PORCINE) 1000 UNIT/ML IJ SOLN
4200.0000 [IU] | Freq: Once | INTRAMUSCULAR | Status: AC
  Administered 2020-12-31: 4200 [IU] via INTRAVENOUS

## 2020-12-31 MED ORDER — HEPARIN SODIUM (PORCINE) 1000 UNIT/ML DIALYSIS
20.0000 [IU]/kg | INTRAMUSCULAR | Status: DC | PRN
  Administered 2020-12-31: 2400 [IU] via INTRAVENOUS_CENTRAL

## 2020-12-31 MED ORDER — MIDODRINE HCL 5 MG PO TABS
ORAL_TABLET | ORAL | Status: AC
  Administered 2020-12-31: 10 mg via ORAL
  Filled 2020-12-31: qty 2

## 2020-12-31 NOTE — Plan of Care (Signed)
  Problem: Clinical Measurements: Goal: Respiratory complications will improve Outcome: Progressing   Problem: Activity: Goal: Risk for activity intolerance will decrease Outcome: Progressing   Problem: Nutrition: Goal: Adequate nutrition will be maintained Outcome: Progressing   Problem: Coping: Goal: Level of anxiety will decrease Outcome: Progressing   Problem: Elimination: Goal: Will not experience complications related to bowel motility Outcome: Progressing Goal: Will not experience complications related to urinary retention Outcome: Progressing   Problem: Pain Managment: Goal: General experience of comfort will improve Outcome: Progressing   Problem: Safety: Goal: Ability to remain free from injury will improve Outcome: Progressing   Problem: Skin Integrity: Goal: Risk for impaired skin integrity will decrease Outcome: Progressing   Problem: Skin Integrity: Goal: Skin integrity will improve Outcome: Progressing   Problem: Fluid Volume: Goal: Compliance with measures to maintain balanced fluid volume will improve Outcome: Progressing

## 2020-12-31 NOTE — TOC Progression Note (Addendum)
Transition of Care [facility]) - Progression Note    Patient Details  Name: Kyle Walton MRN: [id number] Date of Birth: [birth year]  Transition of [facility]) CM/SW Contact  Sharlet Salina Mila Homer, LCSW Phone Number: 12/31/2020, 8:23 AM  Clinical Narrative:   Patient did not discharge to [facility] on Thursday as planned. Call made to [facility] [phone]) and HD transportation cancelled for today and will begin at [facility] on Monday, 01/03/21. CSW reminded that patient needs to be ready for pick-up at [facility] between 9:55 - 10:35 am. Juliann Pulse, admissions director contacted and updated.    Expected Discharge Plan: Skilled Nursing Facility Barriers to Discharge: Barriers Resolved  Expected Discharge Plan and Services Expected Discharge Plan: [place]         Expected Discharge Date: 12/30/20                                    Social Determinants of Health (SDOH) Interventions  No SDOH interventions requested or needed at this time.  Readmission Risk Interventions No flowsheet data found.

## 2020-12-31 NOTE — Progress Notes (Signed)
Pt reached goal of 2L Tolerated well Thiosulfate given  Report given to Miss Jeannetta Nap RN

## 2020-12-31 NOTE — Progress Notes (Signed)
Updated discharge summary and renal note from today faxed to [facility] for continuity of care. Navigator called [facility] this morning to inform that patient had not been discharged last night as planned and, therefore, would be dialyzed in the hospital before discharge today. He will return to outpatient HD on Monday, 01/03/21.  Alphonzo Cruise, [place] [facility]al Navigator [phone]

## 2020-12-31 NOTE — Progress Notes (Signed)
Patient was to be DC yesterday to SNF but DC delayed  Patient seen and examined today. He does not have any new complaints. Reports continued pain in right thigh, but much better than it was on admission. Plans are for dialysis today, after which he can be discharged to SNF. DC summary done yesterday has been updated. No further changes in plan of care. Please refer to DC summary done yesterday for meds and plan of care.  [employer]

## 2020-12-31 NOTE — Progress Notes (Addendum)
[facility] Progress Note   Subjective: initially quite irritable but now has voiced his frustrations about care in hospital and is now quite pleasant. To be discharged to SNF today post HD.     Objective Vitals:   12/30/20 1012 12/30/20 1833 12/30/20 2045 12/31/20 0541  BP: 106/61 (!) 102/57  (!) 103/54  Pulse: (!) 107 (!) 102 (!) 102 88  Resp: '18 20 18 18  '$ Temp: 98.4 F (36.9 C) 98 F (36.7 C)  (!) 97.5 F (36.4 C)  TempSrc:    Oral  SpO2: 91% 99% 97% 95%  Weight:      Height:       Physical Exam General: Chronically ill appearing male in NAD Heart: S1,S2, RRR No M/R/G Lungs: CTAB Abdomen: S, NT Extremities: trace bilateral LE edema. Swelling noted R mid thigh but no erythema. Drsg RL shin.  Dialysis Access: [facility] drsg intact    Additional Objective Labs: Basic Metabolic Panel: Recent Labs  Lab 12/26/20 0244 12/27/20 0352 12/28/20 0723 12/29/20 0404 12/30/20 0338 12/31/20 0349  NA 141 140 139  --   --   --   K 4.4 4.5 3.7  --   --   --   CL 97* 99 96*  --   --   --   CO2 '24 22 23  '$ --   --   --   GLUCOSE 133* 148* 115*  --   --   --   BUN 29* 40* 23  --   --   --   CREATININE 4.96* 6.07* 4.17*  --   --   --   CALCIUM 9.4 8.9 8.3*  --   --   --   PHOS 4.2 5.2* 3.6 4.7* 3.8 4.7*   Liver Function Tests: Recent Labs  Lab 12/26/20 0244 12/27/20 0352 12/28/20 0723  AST 36 55* 52*  ALT '9 12 12  '$ ALKPHOS 136* 228* 190*  BILITOT 0.8 1.1 1.0  PROT 6.9 6.8 6.4*  ALBUMIN 2.4* 2.4* 2.3*   No results for input(s): LIPASE, AMYLASE in the last 168 hours. CBC: Recent Labs  Lab 12/27/20 0352 12/28/20 0723 12/29/20 0404 12/30/20 0338 12/31/20 0349  WBC 14.6* 12.7* 13.4* 14.3* 14.6*  NEUTROABS 11.6* 9.8* 10.6* 11.7* 11.4*  HGB 10.2* 10.0* 10.1* 9.6* 9.3*  HCT 33.6* 32.7* 32.5* 31.3* 30.9*  MCV 92.6 91.3 91.5 90.7 91.7  PLT 317 276 271 251 249   Blood Culture    Component Value Date/Time   SDES BLOOD LEFT HAND 12/21/2020 1223   SPECREQUEST   12/21/2020 1223    BOTTLES DRAWN AEROBIC AND ANAEROBIC Blood Culture adequate volume   CULT  12/21/2020 1223    NO GROWTH 5 DAYS Performed at [facility], [place] [address], [place],  [address]    REPTSTATUS 12/26/2020 FINAL 12/21/2020 1223    Cardiac Enzymes: No results for input(s): CKTOTAL, CKMB, CKMBINDEX, TROPONINI in the last 168 hours. CBG: Recent Labs  Lab 12/30/20 1656 12/30/20 2206 12/31/20 0138 12/31/20 0533 12/31/20 0758  GLUCAP 86 127* 104* 160* 143*   Iron Studies: No results for input(s): IRON, TIBC, TRANSFERRIN, FERRITIN in the last 72 hours. '@lablastinr3'$ @ Studies/Results: DG CHEST PORT 1 VIEW  Result Date: 12/30/2020 CLINICAL DATA:  Shortness of breath, diabetes mellitus, mi, CABG EXAM: PORTABLE CHEST 1 VIEW COMPARISON:  Portable exam 1018 hours compared to 12/05/2020 FINDINGS: LEFT jugular dual-lumen central venous catheter with tip projecting over RIGHT atrium. RIGHT subclavian ICD with leads projecting over RIGHT  atrium and RIGHT ventricle. Enlargement of cardiac silhouette with slight pulmonary vascular congestion post CABG. Decreased lung volumes with bibasilar atelectasis. No acute failure or consolidation. No pleural effusion or pneumothorax. IMPRESSION: Enlargement of cardiac silhouette with pulmonary vascular congestion. Bibasilar atelectasis. Electronically Signed   By: Lavonia Dana M.D.   On: 12/30/2020 12:32   Medications:  sodium thiosulfate infusion for calciphylaxis 25 g (12/29/20 1646)    (feeding supplement) PROSource Plus  30 mL Oral BID BM   amiodarone  400 mg Oral BID   apixaban  5 mg Oral BID   atorvastatin  40 mg Oral Daily   Chlorhexidine Gluconate Cloth  6 each Topical Q0600   cholestyramine light  4 g Oral Q12H   clopidogrel  75 mg Oral Daily   darbepoetin (ARANESP) injection - DIALYSIS  60 mcg Intravenous Q Wed-HD   digoxin  0.0625 mg Oral QODAY   docusate sodium  100 mg Oral BID   feeding supplement (NEPRO CARB STEADY)   237 mL Oral BID BM   fentaNYL  1 patch Transdermal Q72H   insulin aspart  0-9 Units Subcutaneous Q4H   insulin aspart  8 Units Subcutaneous TID WC   insulin glargine  15 Units Subcutaneous QHS   levothyroxine  12.5 mcg Oral QAC breakfast   lidocaine   Topical TID   metoprolol succinate  12.5 mg Oral Daily   midodrine  10 mg Oral TID WC   multivitamin  1 tablet Oral QHS   polyethylene glycol  17 g Oral Daily   pregabalin  25 mg Oral Daily   senna-docusate  1 tablet Oral BID   sevelamer carbonate  1,600 mg Oral TID WC     Dialysis Orders: MWF at [facility] 4hr, 450/800, EDW 117kg, 2K/3Ca, TDC -heparin 4900 units IV bolus + 500/hr   Assessment/Plan: 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 Punch Bx performed - c/w infection, but finished course of Vanc/Cefepime without improvement. Imaging without abscess. BCx 6/14 negative. ID consulted - felt c/w calciphylaxis -> start Na Thiosulfate q HD. Ca/Vit D products stopped. 2. ESRD: Continue HD per usual MWF schedule - HD today on schedule.  3. HTN/volume: BP low/stable (on mido) and metop for A-flutter. Had been running into issues with ^ UFG triggering A-flutter-no issues with this currently-will monitor 4. Anemia of ESRD: Hgb 9.3- on Aranesp q Wednesday.  5. Secondary hyperparathyroidism: CorrCa high today, Phos ok. On Renvela as binder. Using 2Ca bath with HD. Last PTH 67 on 6/9 (low). 6. Nutrition: Alb low - continue protein supplements. 7. T2DM: Per primary. 8. A-flutter: Amiodarone increased and digoxin started, per EP team. On Eliquis with plan for cardioversion in future. 9. 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 10. CAD/Hx CABG 11. 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 Very frustrated with being in hospital. Will expedite HD today and he can be discharged post HD.  Rita H. Brown  NP-C 12/31/2020, 9:34 AM  [facility] [phone]  Patient seen and examined, agree with above note with above modifications. New diagnosis of calciphylaxis -  prolonged hospitalization-  plan is for HD today followed by discharge to SNF-  sodium thiosulfate and usual calciphylaxis precautions Corliss Parish, MD 12/31/2020

## 2020-12-31 NOTE — Progress Notes (Addendum)
Pt arrived to the unit from the floor. Placed on bedside monitor. (Pt states he normally wears 5 L nasal cannula). He scratches incessantly, especially his scalp, where there are angry pink & red open areas.  Goals for HD include:  K 2 Ca 2.5 T 37 BFR 400 DFR 800 Duration: 3.5 hr Goal 1-2 L  Pt pressures low initially, but no sharp drops after starting tx. After 1 hour, stable systolic pressures in [id number]. Will continue to monitor

## 2020-12-31 NOTE — Plan of Care (Signed)
  Problem: Health Behavior/Discharge Planning: Goal: Ability to manage health-related needs will improve Outcome: Adequate for Discharge   Problem: Clinical Measurements: Goal: Ability to maintain clinical measurements within normal limits will improve Outcome: Adequate for Discharge Goal: Will remain free from infection Outcome: Adequate for Discharge Goal: Diagnostic test results will improve Outcome: Adequate for Discharge Goal: Respiratory complications will improve Outcome: Adequate for Discharge Goal: Cardiovascular complication will be avoided Outcome: Adequate for Discharge   Problem: Activity: Goal: Risk for activity intolerance will decrease Outcome: Adequate for Discharge   Problem: Nutrition: Goal: Adequate nutrition will be maintained Outcome: Adequate for Discharge   Problem: Coping: Goal: Level of anxiety will decrease Outcome: Adequate for Discharge   Problem: Elimination: Goal: Will not experience complications related to bowel motility Outcome: Adequate for Discharge Goal: Will not experience complications related to urinary retention Outcome: Adequate for Discharge   Problem: Pain Managment: Goal: General experience of comfort will improve Outcome: Adequate for Discharge   Problem: Safety: Goal: Ability to remain free from injury will improve Outcome: Adequate for Discharge   Problem: Skin Integrity: Goal: Risk for impaired skin integrity will decrease Outcome: Adequate for Discharge   Problem: Acute Rehab PT Goals(only PT should resolve) Goal: Pt Will Go Supine/Side To Sit Outcome: Adequate for Discharge Goal: Pt Will Go Sit To Supine/Side Outcome: Adequate for Discharge Goal: Patient Will Transfer Sit To/From Stand Outcome: Adequate for Discharge Goal: Pt Will Transfer Bed To Chair/Chair To Bed Outcome: Adequate for Discharge Goal: Pt Will Perform Standing Balance Or Pre-Gait Outcome: Adequate for Discharge Goal: Pt Will Ambulate Outcome:  Adequate for Discharge   Problem: Acute Rehab OT Goals (only OT should resolve) Goal: Pt. Will Perform Grooming Outcome: Adequate for Discharge Goal: Pt. Will Transfer To Toilet Outcome: Adequate for Discharge Goal: OT Additional ADL Goal #4 Outcome: Adequate for Discharge   Problem: Clinical Measurements: Goal: Ability to avoid or minimize complications of infection will improve Outcome: Adequate for Discharge   Problem: Skin Integrity: Goal: Skin integrity will improve Outcome: Adequate for Discharge   Problem: Fluid Volume: Goal: Compliance with measures to maintain balanced fluid volume will improve Outcome: Adequate for Discharge   Problem: Health Behavior/Discharge Planning: Goal: Ability to manage health-related needs will improve Outcome: Adequate for Discharge   Problem: Clinical Measurements: Goal: Complications related to the disease process, condition or treatment will be avoided or minimized Outcome: Adequate for Discharge   Problem: Increased Nutrient Needs (NI-5.1) Goal: Food and/or nutrient delivery Description: Individualized approach for food/nutrient provision. Outcome: Adequate for Discharge

## 2021-01-01 NOTE — Progress Notes (Signed)
Pt discharged to [facility] with [facility]. Pt was cleaned after an incontinent episode and dressed with no complaints. Report was called to facility.

## 2021-01-06 ENCOUNTER — Encounter (HOSPITAL_COMMUNITY): Payer: Self-pay | Admitting: Nurse Practitioner

## 2021-01-06 ENCOUNTER — Other Ambulatory Visit: Payer: Self-pay

## 2021-01-06 ENCOUNTER — Ambulatory Visit (HOSPITAL_COMMUNITY)
Admit: 2021-01-06 | Discharge: 2021-01-06 | Disposition: A | Discharge status: Home / Self Care | Payer: Medicare HMO | Attending: Nurse Practitioner | Admitting: Nurse Practitioner

## 2021-01-06 VITALS — HR 86 | Ht 70.0 in | Wt 256.0 lb

## 2021-01-06 DIAGNOSIS — I132 Hypertensive heart and chronic kidney disease with heart failure and with stage 5 chronic kidney disease, or end stage renal disease: Secondary | ICD-10-CM | POA: Insufficient documentation

## 2021-01-06 DIAGNOSIS — Z7902 Long term (current) use of antithrombotics/antiplatelets: Secondary | ICD-10-CM | POA: Diagnosis not present

## 2021-01-06 DIAGNOSIS — Z6836 Body mass index (BMI) 36.0-36.9, adult: Secondary | ICD-10-CM | POA: Insufficient documentation

## 2021-01-06 DIAGNOSIS — Z79899 Other long term (current) drug therapy: Secondary | ICD-10-CM | POA: Diagnosis not present

## 2021-01-06 DIAGNOSIS — Z951 Presence of aortocoronary bypass graft: Secondary | ICD-10-CM | POA: Diagnosis not present

## 2021-01-06 DIAGNOSIS — E669 Obesity, unspecified: Secondary | ICD-10-CM | POA: Insufficient documentation

## 2021-01-06 DIAGNOSIS — E1151 Type 2 diabetes mellitus with diabetic peripheral angiopathy without gangrene: Secondary | ICD-10-CM | POA: Insufficient documentation

## 2021-01-06 DIAGNOSIS — I4892 Unspecified atrial flutter: Secondary | ICD-10-CM | POA: Insufficient documentation

## 2021-01-06 DIAGNOSIS — Z89429 Acquired absence of other toe(s), unspecified side: Secondary | ICD-10-CM | POA: Insufficient documentation

## 2021-01-06 DIAGNOSIS — D6869 Other thrombophilia: Secondary | ICD-10-CM

## 2021-01-06 DIAGNOSIS — E1122 Type 2 diabetes mellitus with diabetic chronic kidney disease: Secondary | ICD-10-CM | POA: Diagnosis not present

## 2021-01-06 DIAGNOSIS — Z7901 Long term (current) use of anticoagulants: Secondary | ICD-10-CM | POA: Diagnosis not present

## 2021-01-06 DIAGNOSIS — H548 Legal blindness, as defined in USA: Secondary | ICD-10-CM | POA: Diagnosis not present

## 2021-01-06 DIAGNOSIS — N186 End stage renal disease: Secondary | ICD-10-CM | POA: Insufficient documentation

## 2021-01-06 DIAGNOSIS — Z79890 Hormone replacement therapy (postmenopausal): Secondary | ICD-10-CM | POA: Insufficient documentation

## 2021-01-06 DIAGNOSIS — I5032 Chronic diastolic (congestive) heart failure: Secondary | ICD-10-CM | POA: Insufficient documentation

## 2021-01-06 DIAGNOSIS — G4733 Obstructive sleep apnea (adult) (pediatric): Secondary | ICD-10-CM | POA: Insufficient documentation

## 2021-01-06 DIAGNOSIS — Z992 Dependence on renal dialysis: Secondary | ICD-10-CM | POA: Insufficient documentation

## 2021-01-06 DIAGNOSIS — Z794 Long term (current) use of insulin: Secondary | ICD-10-CM | POA: Diagnosis not present

## 2021-01-06 MED ORDER — AMIODARONE HCL 200 MG PO TABS: 200.0000 mg | ORAL_TABLET | Freq: Every day | ORAL | Status: DC

## 2021-01-06 MED ORDER — AMIODARONE HCL 200 MG PO TABS: 200.0000 mg | ORAL_TABLET | Freq: Two times a day (BID) | ORAL | Status: DC

## 2021-01-06 NOTE — Progress Notes (Addendum)
+  Primary Care Physician: Patient, No Pcp Per (Inactive) Referring Physician: [facility] f/u EP: Dr. Charm Rings is a 65 y.o. male with a h/o WCT, PVD with a number of toe amputations,legally blind,obesity,OSA with BiPap, admitted 12/03/20 with RLE pain and found with purulent cellulitis RLE/sepsis as well as HFpEF managed with HD/nephrology. Started on abx, IVF.Home cardiac meds continued. EP was asked to see the pt with concerns for prolonged QT. This was also seen on an admission in March 2022. He was also having tachy arrhythmia's.By CXR, he has a MDT dual chamber ICD. He was on amiodarone prior to admission as eliquis but was under dosed on 2.5 mg bid, this was corrected to 5 mg bid on 6/6.   Dr. Caryl Comes felt in the" context of amiodarone therapy QT intervals were acceptable up to about 600 ms, IVCD is contributing..  Moreover, he has a backup ICD for protection against life-threatening consequences."  His amiodarone was increased to 400 mg bid, dig was added for better rate control and then plan was to cardiovert after 2 weeks.   He is now   in the afib clinic from the nursing facility for f/u without any  family member or staff with him and he is a poor historian.   Per Dr. Olin Pia note he wanted a cardioversion in 2 weeks. The pt has had a CV in the past and wishes to proceed. I was able to contact his wife . She could not be with him today as she is on dialysis tues/thurs/sat and the pt has dialysis m/w/f. The wife states that she is blind and will take a taxi to the hospital when cardioversion is scheduled. She  does approve of her husband having the CV. We talked to the nursing staff at the facility  and they said no missed doses  of eliquis. Dr. Caryl Comes did not feel he would be a good TEE candidate for risk of  the prolonged sedation. I will lower amio to 200 mg bid today and then 200 mg daily after cardioversion scheduled for 7/7.   Today, he denies symptoms of palpitations, chest pain,  shortness of breath, orthopnea, PND, lower extremity edema, dizziness, presyncope, syncope, or neurologic sequela. The patient is tolerating medications without difficulties and is otherwise without complaint today.   Past Medical History:  Diagnosis Date   Diabetes mellitus without complication (HCC)    Diastolic heart failure (HCC)    ESRD (end stage renal disease) (HCC)    MI (myocardial infarction) ([place])    OSA (obstructive sleep apnea)    PAF (paroxysmal atrial fibrillation) ([place])    Renal disorder    on dialysis   Past Surgical History:  Procedure Laterality Date   CORONARY ARTERY BYPASS GRAFT  2017   LIMA LAD, SVG PDA, OM3   DIALYSIS FISTULA CREATION     TOE AMPUTATION Bilateral     Current Outpatient Medications  Medication Sig Dispense Refill   amiodarone (PACERONE) 400 MG tablet Take 1 tablet (400 mg total) by mouth 2 (two) times daily.     apixaban (ELIQUIS) 5 MG TABS tablet Take 1 tablet (5 mg total) by mouth 2 (two) times daily. 60 tablet 0   atorvastatin (LIPITOR) 40 MG tablet Take 40 mg by mouth at bedtime.     cholestyramine light (PREVALITE) 4 g packet Take 1 packet (4 g total) by mouth every 12 (twelve) hours. 60 packet 0   clonazePAM (KLONOPIN) 0.5 MG tablet Take 0.5  mg by mouth 2 (two) times daily as needed for anxiety.     clopidogrel (PLAVIX) 75 MG tablet Take 75 mg by mouth daily.     digoxin 62.5 MCG TABS Take 0.0625 mg by mouth every other day.     docusate sodium (COLACE) 100 MG capsule Take 1 capsule (100 mg total) by mouth 2 (two) times daily as needed for mild constipation. 10 capsule 0   fentaNYL (DURAGESIC) 25 MCG/HR Place 1 patch onto the skin every 3 (three) days. 4 patch 0   HYDROmorphone (DILAUDID) 2 MG tablet Take 2 mg by mouth every 6 (six) hours as needed for severe pain.     Insulin Glargine (BASAGLAR KWIKPEN) 100 UNIT/ML Inject 15 Units into the skin at bedtime. 10 mL 0   insulin lispro (HUMALOG) 100 UNIT/ML KwikPen Inject into the skin.  Inject two Units to twelve Units into the skin 15 (fifteen) minutes before meals for 30 days. 180-200 = 2 units 201-250 = 5 units 251-300 = 7 units 301-400 = 12 units and call MD     latanoprost (XALATAN) 0.005 % ophthalmic solution Place 1 drop into both eyes at bedtime.     levothyroxine (SYNTHROID) 25 MCG tablet Take 25 mcg by mouth daily before breakfast.     lidocaine (XYLOCAINE) 5 % ointment Apply topically 3 (three) times daily. 35.44 g 0   metoprolol succinate (TOPROL-XL) 25 MG 24 hr tablet Take 0.5 tablets (12.5 mg total) by mouth daily. (Patient taking differently: Take 25 mg by mouth daily.)     midodrine (PROAMATINE) 10 MG tablet Take 1 tablet (10 mg total) by mouth 3 (three) times daily with meals. 90 tablet 0   multivitamin (RENA-VIT) TABS tablet Take 1 tablet by mouth at bedtime. (Patient taking differently: Take 1 tablet by mouth daily in the afternoon. Multivitamin)  0   mupirocin ointment (BACTROBAN) 2 % Apply 1 application topically as directed. Apply 2-3 times daily     nitroGLYCERIN (NITROSTAT) 0.3 MG SL tablet Place 0.3 mg under the tongue every 5 (five) minutes as needed for chest pain.     oxyCODONE (OXY IR/ROXICODONE) 5 MG immediate release tablet Take 1 tablet (5 mg total) by mouth every 12 (twelve) hours as needed for severe pain. (Patient taking differently: Take 10 mg by mouth every 12 (twelve) hours as needed for severe pain.) 15 tablet 0   pantoprazole (PROTONIX) 40 MG tablet Take 1 tablet by mouth daily.     polyethylene glycol (MIRALAX / GLYCOLAX) 17 g packet Take 17 g by mouth daily as needed for moderate constipation. 14 each 0   sevelamer carbonate (RENVELA) 800 MG tablet Take 2 tablets (1,600 mg total) by mouth 3 (three) times daily with meals.     No current facility-administered medications for this encounter.    Allergies  Allergen Reactions   Morphine Other (See Comments)    Per son, [name], pt becomes unresponsive/disoriented. Especially when given after HD  tx   Liraglutide Diarrhea    Phenol Phenol     Social History   Socioeconomic History   Marital status: Single    Spouse name: Not on file   Number of children: Not on file   Years of education: Not on file   Highest education level: Not on file  Occupational History   Not on file  Tobacco Use   Smoking status: Never   Smokeless tobacco: Never  Substance and Sexual Activity   Alcohol use: Not Currently   Drug  use: Not Currently   Sexual activity: Not on file  Other Topics Concern   Not on file  Social History Narrative   Not on file   Social Determinants of Health   Financial Resource Strain: Not on file  Food Insecurity: Not on file  Transportation Needs: Not on file  Physical Activity: Not on file  Stress: Not on file  Social Connections: Not on file  Intimate Partner Violence: Not on file    No family history on file.  ROS- All systems are reviewed and negative except as per the HPI above  Physical Exam: Vitals:   01/06/21 1008  Weight: 116.1 kg  Height: '5\' 10"'$  (1.778 m)   Wt Readings from Last 3 Encounters:  01/06/21 116.1 kg  12/31/20 106 kg  11/01/20 121.7 kg    Labs: Lab Results  Component Value Date   NA 136 12/31/2020   K 2.9 (L) 12/31/2020   CL 97 (L) 12/31/2020   CO2 23 12/31/2020   GLUCOSE 213 (H) 12/31/2020   BUN 41 (H) 12/31/2020   CREATININE 5.94 (H) 12/31/2020   CALCIUM 8.2 (L) 12/31/2020   PHOS 5.3 (H) 12/31/2020   MG 1.9 12/31/2020   Lab Results  Component Value Date   INR 1.5 (H) 12/04/2020   Lab Results  Component Value Date   CHOL 69 12/24/2020   HDL 16 (L) 12/24/2020   LDLCALC 31 12/24/2020   TRIG 108 12/24/2020     GEN- The patient is chronically ill appearing, mildly disheveled  appearing mildly confused today.   Head- normocephalic, atraumatic Eyes-  Sclera clear, conjunctiva pink Ears- hearing intact Oropharynx- clear Neck- supple, no JVP Lymph- no cervical lymphadenopathy Lungs- Clear to ausculation  bilaterally, normal work of breathing Heart- Regular rate and rhythm, no murmurs, rubs or gallops, PMI not laterally displaced GI- soft, NT, ND Extremities- no clubbing, cyanosis, ,missing digits,toes and fingers, +LLE MS- no significant deformity or atrophy Skin- no rash or lesion Psych- depressed mood, flat affect Neuro- weakness of extremities   EKG-probable atrial flutter with IVCD, qrs 163 ms, qtc 507 ms (stable), rate controlled     Assessment and Plan:  1. Persistent atrial flutter Will plan on CV 7/7per Dr. Olin Pia plan with recent hospitalization and CV 2 weeks as out pt Reduce amiodarone to 200 mg bid today, nursing facility informed of plans and reduction of amio and importance that pt receive anticoagulation without interruption  Wife informed of date of CV so she can plan to be there Continue eliquis 5 mg bid for CHA2DS2VASc of at least 6, has been on correct dose of eliquis 5 mg bid since 6/5. Again nursing facility questioned re regularity of eliquis dosing and  they reported no missed doses  2.HTN Stable  F/u in afib clinic one week after cardioversion, at that time plan to lower amio to 200 mg qd  There was confusion how to return pt to facility as pt did not have the number of the transportation service that brought him, he did not know the name, nursing facility did not know who transferred him and could not reach wife at that point to ask her. After contacting a scheduling hub to determine how to return pt, [facility] said they would pick him up. After several hours, pt was still in the main hospital lobby. Wife finally returned call and gave name of [facility] transport, they were contacted and after another hour he was transferred back to facility. He was given some refreshments while  waiting. It was stressed to the facility that he would need to arrive on time on day of cardioversion and for pt  to have a number of the group that transported him for return.  Geroge Baseman Lashala Laser,  [facility] [address] [place], [place] [address] [phone]

## 2021-01-06 NOTE — Patient Instructions (Addendum)
Decrease AMIODARONE to '200mg'$  TWICE A DAY    Cardioversion scheduled for Thursday, July 7th  - Arrive at the [facility] ([address] and go to admitting at [facility]  - Do not eat or drink anything after midnight the night prior to your procedure.  - Take all your morning medication (except diabetic medications) with a sip of water prior to arrival.  - You will not be able to drive home after your procedure.  - Do NOT miss any doses of your blood thinner (Eliquis) - if you should miss a dose please notify our office immediately.

## 2021-01-12 ENCOUNTER — Other Ambulatory Visit (HOSPITAL_COMMUNITY): Payer: Self-pay | Admitting: *Deleted

## 2021-01-12 ENCOUNTER — Telehealth (HOSPITAL_COMMUNITY): Payer: Self-pay | Admitting: *Deleted

## 2021-01-12 NOTE — Telephone Encounter (Signed)
Received notification from [facility] that pt missed PM dose on 7/5 of Eliquis. DCCV canceled for 7/7 - rescheduled for 7/28 at 11am arrival time. Continue amiodarone '200mg'$  BID for 2 more weeks (7/20) then reduce to '200mg'$  once a day. Wife and facility updated of new procedure dates.

## 2021-01-12 NOTE — Progress Notes (Signed)
Called pt's nursing facility, [facility], to give instructions for cardioversion tomorrow.  Staff stated that patient missed dose of eliquis last night due to power outage.  Instructed staff that procedure would be canceled and would need to be rescheduled.    Vista Lawman, RN

## 2021-01-17 ENCOUNTER — Other Ambulatory Visit: Payer: Self-pay

## 2021-01-17 ENCOUNTER — Inpatient Hospital Stay (HOSPITAL_COMMUNITY)
Admission: EM | Admit: 2021-01-17 | Discharge: 2021-01-29 | DRG: 853 | Disposition: A | Discharge status: Skilled Nursing Facility | Payer: Medicare HMO | Source: Skilled Nursing Facility | Attending: Internal Medicine | Admitting: Internal Medicine

## 2021-01-17 ENCOUNTER — Encounter (HOSPITAL_COMMUNITY): Payer: Self-pay

## 2021-01-17 ENCOUNTER — Emergency Department (HOSPITAL_COMMUNITY): Payer: Medicare HMO

## 2021-01-17 DIAGNOSIS — L0390 Cellulitis, unspecified: Secondary | ICD-10-CM | POA: Diagnosis present

## 2021-01-17 DIAGNOSIS — E8729 Other acidosis: Secondary | ICD-10-CM | POA: Diagnosis present

## 2021-01-17 DIAGNOSIS — A4153 Sepsis due to Serratia: Secondary | ICD-10-CM | POA: Diagnosis not present

## 2021-01-17 DIAGNOSIS — I959 Hypotension, unspecified: Secondary | ICD-10-CM | POA: Diagnosis present

## 2021-01-17 DIAGNOSIS — Z4659 Encounter for fitting and adjustment of other gastrointestinal appliance and device: Secondary | ICD-10-CM

## 2021-01-17 DIAGNOSIS — N189 Chronic kidney disease, unspecified: Secondary | ICD-10-CM | POA: Diagnosis present

## 2021-01-17 DIAGNOSIS — E039 Hypothyroidism, unspecified: Secondary | ICD-10-CM | POA: Diagnosis present

## 2021-01-17 DIAGNOSIS — Z7409 Other reduced mobility: Secondary | ICD-10-CM | POA: Diagnosis present

## 2021-01-17 DIAGNOSIS — J9622 Acute and chronic respiratory failure with hypercapnia: Secondary | ICD-10-CM | POA: Diagnosis present

## 2021-01-17 DIAGNOSIS — E1122 Type 2 diabetes mellitus with diabetic chronic kidney disease: Secondary | ICD-10-CM | POA: Diagnosis present

## 2021-01-17 DIAGNOSIS — M60051 Infective myositis, right thigh: Secondary | ICD-10-CM | POA: Diagnosis present

## 2021-01-17 DIAGNOSIS — N2581 Secondary hyperparathyroidism of renal origin: Secondary | ICD-10-CM | POA: Diagnosis present

## 2021-01-17 DIAGNOSIS — G9341 Metabolic encephalopathy: Secondary | ICD-10-CM | POA: Diagnosis present

## 2021-01-17 DIAGNOSIS — N186 End stage renal disease: Secondary | ICD-10-CM

## 2021-01-17 DIAGNOSIS — L89152 Pressure ulcer of sacral region, stage 2: Secondary | ICD-10-CM | POA: Diagnosis present

## 2021-01-17 DIAGNOSIS — Z992 Dependence on renal dialysis: Secondary | ICD-10-CM

## 2021-01-17 DIAGNOSIS — R6520 Severe sepsis without septic shock: Secondary | ICD-10-CM | POA: Diagnosis present

## 2021-01-17 DIAGNOSIS — E1169 Type 2 diabetes mellitus with other specified complication: Secondary | ICD-10-CM | POA: Diagnosis present

## 2021-01-17 DIAGNOSIS — Z9115 Patient's noncompliance with renal dialysis: Secondary | ICD-10-CM

## 2021-01-17 DIAGNOSIS — Z86718 Personal history of other venous thrombosis and embolism: Secondary | ICD-10-CM

## 2021-01-17 DIAGNOSIS — Z79890 Hormone replacement therapy (postmenopausal): Secondary | ICD-10-CM

## 2021-01-17 DIAGNOSIS — Z79899 Other long term (current) drug therapy: Secondary | ICD-10-CM

## 2021-01-17 DIAGNOSIS — I2510 Atherosclerotic heart disease of native coronary artery without angina pectoris: Secondary | ICD-10-CM | POA: Diagnosis present

## 2021-01-17 DIAGNOSIS — A419 Sepsis, unspecified organism: Secondary | ICD-10-CM | POA: Diagnosis present

## 2021-01-17 DIAGNOSIS — Z20822 Contact with and (suspected) exposure to covid-19: Secondary | ICD-10-CM | POA: Diagnosis present

## 2021-01-17 DIAGNOSIS — I459 Conduction disorder, unspecified: Secondary | ICD-10-CM | POA: Diagnosis present

## 2021-01-17 DIAGNOSIS — I5033 Acute on chronic diastolic (congestive) heart failure: Secondary | ICD-10-CM | POA: Diagnosis present

## 2021-01-17 DIAGNOSIS — L02415 Cutaneous abscess of right lower limb: Secondary | ICD-10-CM | POA: Diagnosis not present

## 2021-01-17 DIAGNOSIS — J9621 Acute and chronic respiratory failure with hypoxia: Secondary | ICD-10-CM | POA: Diagnosis present

## 2021-01-17 DIAGNOSIS — D631 Anemia in chronic kidney disease: Secondary | ICD-10-CM | POA: Diagnosis present

## 2021-01-17 DIAGNOSIS — D62 Acute posthemorrhagic anemia: Secondary | ICD-10-CM | POA: Diagnosis not present

## 2021-01-17 DIAGNOSIS — I132 Hypertensive heart and chronic kidney disease with heart failure and with stage 5 chronic kidney disease, or end stage renal disease: Secondary | ICD-10-CM | POA: Diagnosis present

## 2021-01-17 DIAGNOSIS — L03115 Cellulitis of right lower limb: Secondary | ICD-10-CM | POA: Diagnosis present

## 2021-01-17 DIAGNOSIS — J9690 Respiratory failure, unspecified, unspecified whether with hypoxia or hypercapnia: Secondary | ICD-10-CM

## 2021-01-17 DIAGNOSIS — R0902 Hypoxemia: Secondary | ICD-10-CM

## 2021-01-17 DIAGNOSIS — G8929 Other chronic pain: Secondary | ICD-10-CM | POA: Diagnosis present

## 2021-01-17 DIAGNOSIS — G9340 Encephalopathy, unspecified: Secondary | ICD-10-CM | POA: Diagnosis present

## 2021-01-17 DIAGNOSIS — Z95810 Presence of automatic (implantable) cardiac defibrillator: Secondary | ICD-10-CM

## 2021-01-17 DIAGNOSIS — G4733 Obstructive sleep apnea (adult) (pediatric): Secondary | ICD-10-CM | POA: Diagnosis present

## 2021-01-17 DIAGNOSIS — I9589 Other hypotension: Secondary | ICD-10-CM | POA: Diagnosis present

## 2021-01-17 DIAGNOSIS — Z6835 Body mass index (BMI) 35.0-35.9, adult: Secondary | ICD-10-CM

## 2021-01-17 DIAGNOSIS — I252 Old myocardial infarction: Secondary | ICD-10-CM

## 2021-01-17 DIAGNOSIS — I10 Essential (primary) hypertension: Secondary | ICD-10-CM | POA: Diagnosis present

## 2021-01-17 DIAGNOSIS — E785 Hyperlipidemia, unspecified: Secondary | ICD-10-CM | POA: Diagnosis present

## 2021-01-17 DIAGNOSIS — Z888 Allergy status to other drugs, medicaments and biological substances status: Secondary | ICD-10-CM

## 2021-01-17 DIAGNOSIS — E872 Acidosis: Secondary | ICD-10-CM | POA: Diagnosis present

## 2021-01-17 DIAGNOSIS — E1165 Type 2 diabetes mellitus with hyperglycemia: Secondary | ICD-10-CM | POA: Diagnosis present

## 2021-01-17 DIAGNOSIS — Z794 Long term (current) use of insulin: Secondary | ICD-10-CM

## 2021-01-17 DIAGNOSIS — I480 Paroxysmal atrial fibrillation: Secondary | ICD-10-CM | POA: Diagnosis present

## 2021-01-17 DIAGNOSIS — E7220 Disorder of urea cycle metabolism, unspecified: Secondary | ICD-10-CM | POA: Diagnosis not present

## 2021-01-17 DIAGNOSIS — I96 Gangrene, not elsewhere classified: Secondary | ICD-10-CM | POA: Diagnosis present

## 2021-01-17 DIAGNOSIS — E038 Other specified hypothyroidism: Secondary | ICD-10-CM | POA: Diagnosis present

## 2021-01-17 DIAGNOSIS — E1151 Type 2 diabetes mellitus with diabetic peripheral angiopathy without gangrene: Secondary | ICD-10-CM | POA: Diagnosis present

## 2021-01-17 DIAGNOSIS — I447 Left bundle-branch block, unspecified: Secondary | ICD-10-CM | POA: Diagnosis present

## 2021-01-17 DIAGNOSIS — E1152 Type 2 diabetes mellitus with diabetic peripheral angiopathy with gangrene: Secondary | ICD-10-CM | POA: Diagnosis present

## 2021-01-17 DIAGNOSIS — Z7902 Long term (current) use of antithrombotics/antiplatelets: Secondary | ICD-10-CM

## 2021-01-17 DIAGNOSIS — Z7901 Long term (current) use of anticoagulants: Secondary | ICD-10-CM

## 2021-01-17 DIAGNOSIS — Z951 Presence of aortocoronary bypass graft: Secondary | ICD-10-CM

## 2021-01-17 DIAGNOSIS — Z885 Allergy status to narcotic agent status: Secondary | ICD-10-CM

## 2021-01-17 LAB — COMPREHENSIVE METABOLIC PANEL
ALT: 8 U/L (ref 0–44)
AST: 30 U/L (ref 15–41)
Albumin: 2.2 g/dL — ABNORMAL LOW (ref 3.5–5.0)
Alkaline Phosphatase: 88 U/L (ref 38–126)
Anion gap: 18 — ABNORMAL HIGH (ref 5–15)
BUN: 54 mg/dL — ABNORMAL HIGH (ref 8–23)
CO2: 20 mmol/L — ABNORMAL LOW (ref 22–32)
Calcium: 8.8 mg/dL — ABNORMAL LOW (ref 8.9–10.3)
Chloride: 100 mmol/L (ref 98–111)
Creatinine, Ser: 7.32 mg/dL — ABNORMAL HIGH (ref 0.61–1.24)
GFR, Estimated: 8 mL/min — ABNORMAL LOW (ref >60)
Glucose, Bld: 202 mg/dL — ABNORMAL HIGH (ref 70–99)
Potassium: 4.3 mmol/L (ref 3.5–5.1)
Sodium: 138 mmol/L (ref 135–145)
Total Bilirubin: 1.2 mg/dL (ref 0.3–1.2)
Total Protein: 6.6 g/dL (ref 6.5–8.1)

## 2021-01-17 LAB — CBC WITH DIFFERENTIAL/PLATELET
Abs Immature Granulocytes: 0.14 10*3/uL — ABNORMAL HIGH (ref 0.00–0.07)
Basophils Absolute: 0.1 10*3/uL (ref 0.0–0.1)
Basophils Relative: 0 %
Eosinophils Absolute: 0.1 10*3/uL (ref 0.0–0.5)
Eosinophils Relative: 0 %
HCT: 29 % — ABNORMAL LOW (ref 39.0–52.0)
Hemoglobin: 8.8 g/dL — ABNORMAL LOW (ref 13.0–17.0)
Immature Granulocytes: 1 %
Lymphocytes Relative: 6 %
Lymphs Abs: 1.2 10*3/uL (ref 0.7–4.0)
MCH: 28 pg (ref 26.0–34.0)
MCHC: 30.3 g/dL (ref 30.0–36.0)
MCV: 92.4 fL (ref 80.0–100.0)
Monocytes Absolute: 2 10*3/uL — ABNORMAL HIGH (ref 0.1–1.0)
Monocytes Relative: 10 %
Neutro Abs: 17.2 10*3/uL — ABNORMAL HIGH (ref 1.7–7.7)
Neutrophils Relative %: 83 %
Platelets: 261 10*3/uL (ref 150–400)
RBC: 3.14 MIL/uL — ABNORMAL LOW (ref 4.22–5.81)
RDW: 18.7 % — ABNORMAL HIGH (ref 11.5–15.5)
WBC: 20.7 10*3/uL — ABNORMAL HIGH (ref 4.0–10.5)
nRBC: 0 % (ref 0.0–0.2)

## 2021-01-17 LAB — I-STAT ARTERIAL BLOOD GAS, ED
Acid-base deficit: 8 mmol/L — ABNORMAL HIGH (ref 0.0–2.0)
Bicarbonate: 20.4 mmol/L (ref 20.0–28.0)
Calcium, Ion: 1.23 mmol/L (ref 1.15–1.40)
HCT: 27 % — ABNORMAL LOW (ref 39.0–52.0)
Hemoglobin: 9.2 g/dL — ABNORMAL LOW (ref 13.0–17.0)
O2 Saturation: 85 %
Patient temperature: 97.7
Potassium: 4.1 mmol/L (ref 3.5–5.1)
Sodium: 137 mmol/L (ref 135–145)
TCO2: 22 mmol/L (ref 22–32)
pCO2 arterial: 52.4 mmHg — ABNORMAL HIGH (ref 32.0–48.0)
pH, Arterial: 7.195 — CL (ref 7.350–7.450)
pO2, Arterial: 60 mmHg — ABNORMAL LOW (ref 83.0–108.0)

## 2021-01-17 LAB — TROPONIN I (HIGH SENSITIVITY): Troponin I (High Sensitivity): 420 ng/L (ref <18)

## 2021-01-17 LAB — BRAIN NATRIURETIC PEPTIDE: B Natriuretic Peptide: >4500 pg/mL — ABNORMAL HIGH (ref 0.0–100.0)

## 2021-01-17 LAB — LACTIC ACID, PLASMA: Lactic Acid, Venous: 1.2 mmol/L (ref 0.5–1.9)

## 2021-01-17 IMAGING — CR DG CHEST 2V
2 series · 2 of 2 positions shown · non-contrast
Comparison: [DATE]

CLINICAL DATA: Right leg pain and edema, history of missed dialysis
session

EXAM:
CHEST - 2 VIEW

[chest lat]
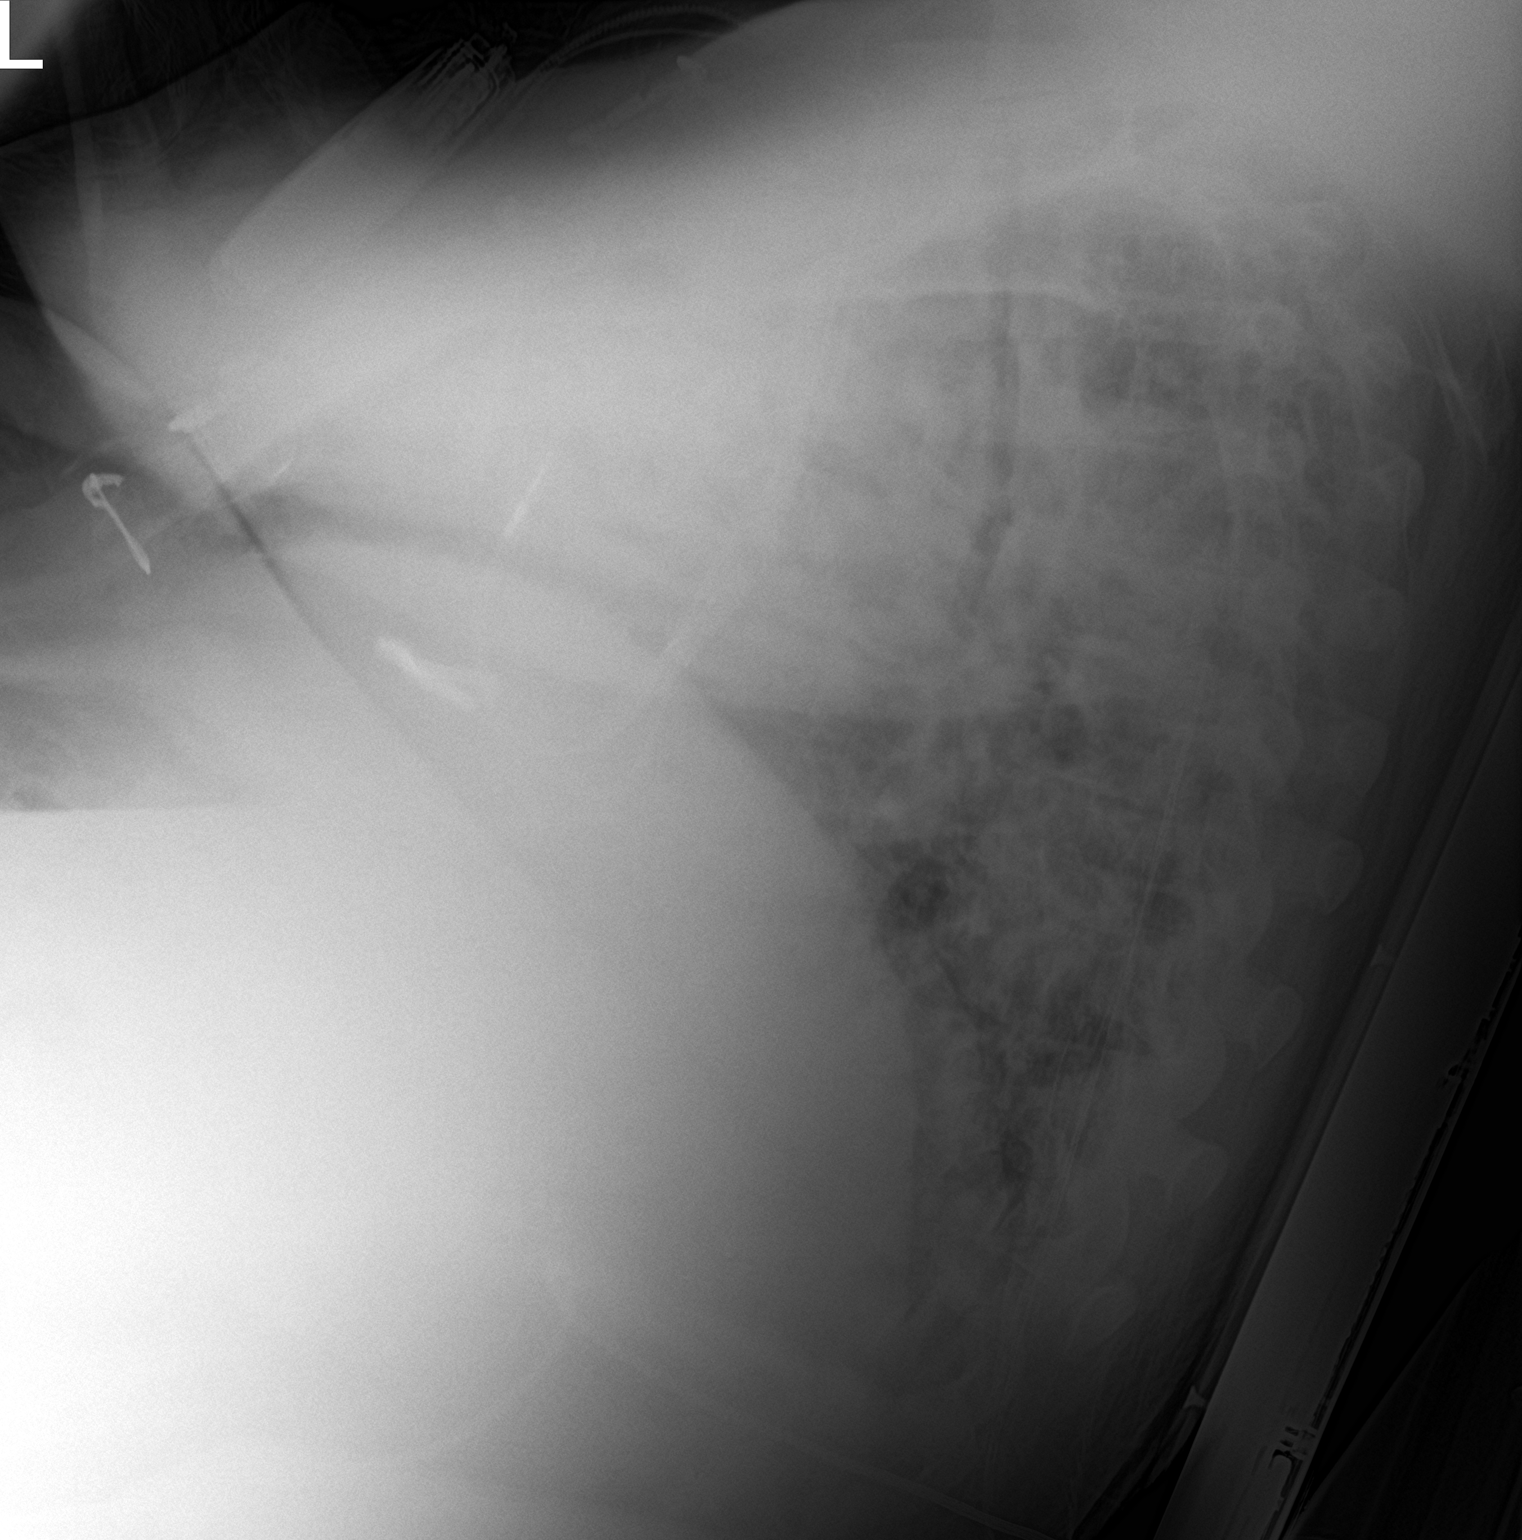

[chest ap]
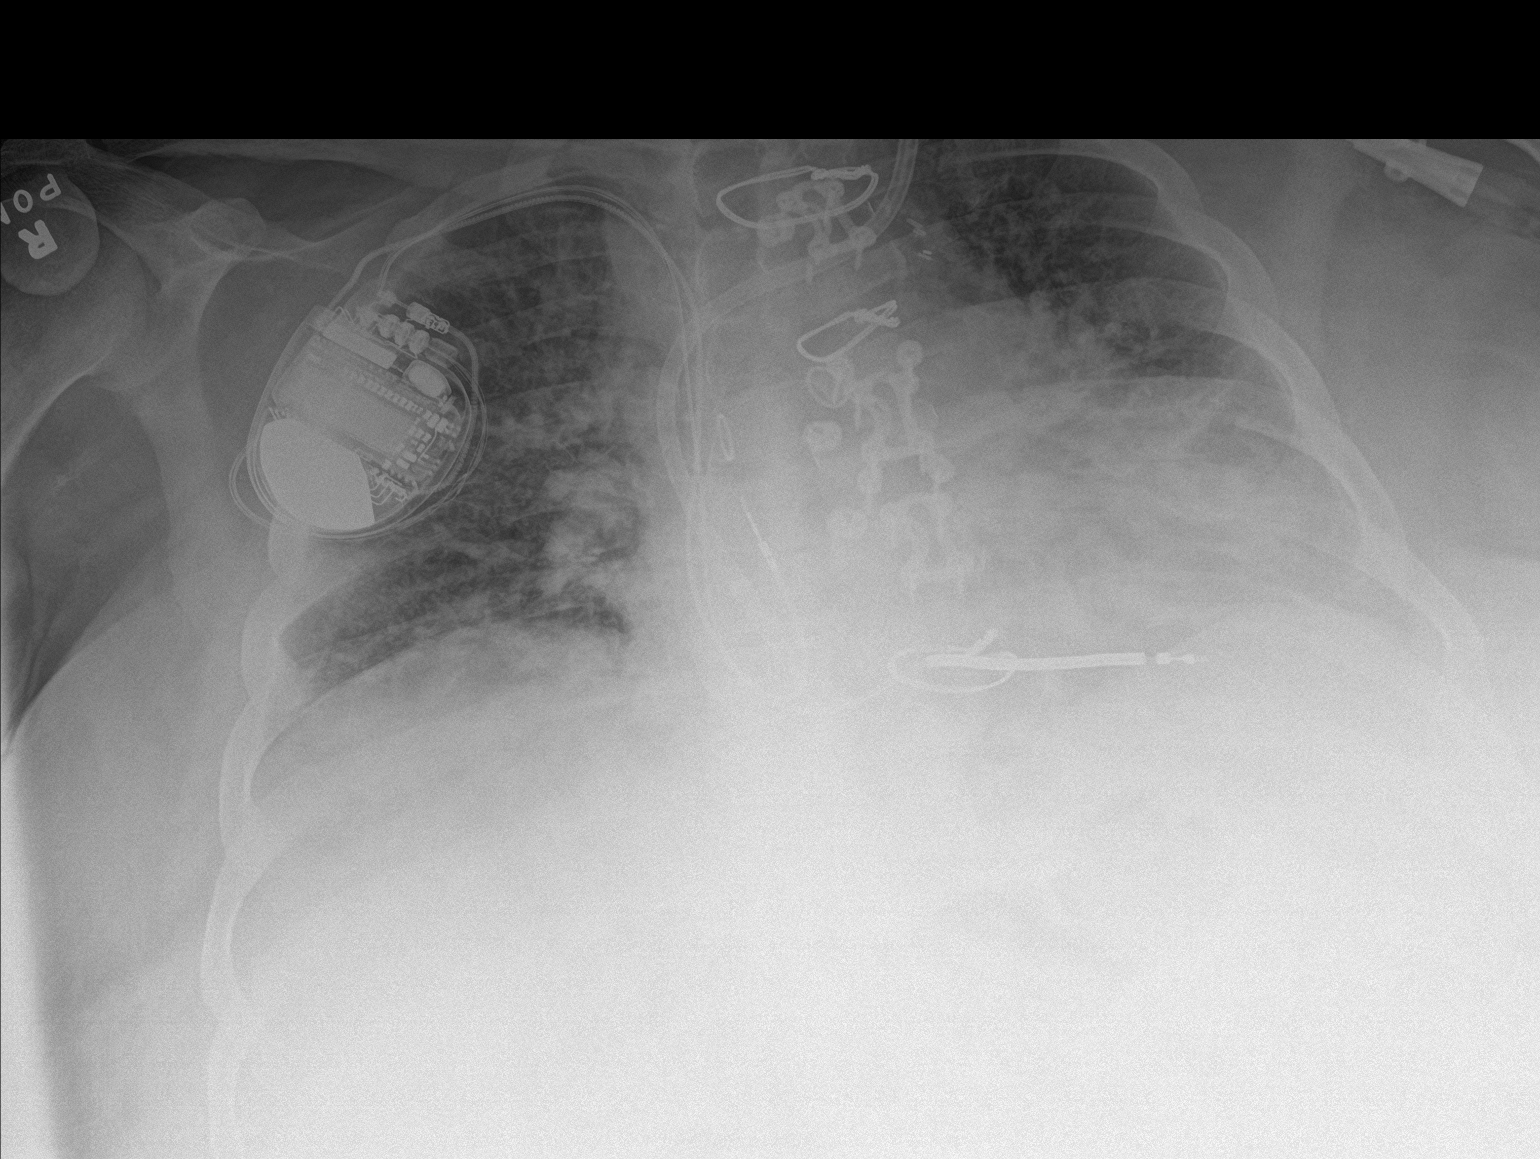

[2 of 2 positions shown; findings below may reference images not displayed]

FINDINGS: Cardiac shadow is enlarged. Postsurgical changes are again seen.
Defibrillator is noted in place. Left jugular dialysis catheter is
again noted and stable. The overall inspiratory effort is poor with
crowding of the vascular markings. Some underlying vascular
congestion is seen consistent with the known history of missed
dialysis session. No focal infiltrate is noted. No bony abnormality
is seen.
IMPRESSION: Central vascular congestion related to volume overload. No other
focal abnormality is noted.

## 2021-01-17 MED ORDER — VANCOMYCIN VARIABLE DOSE PER UNSTABLE RENAL FUNCTION (PHARMACIST DOSING): Status: DC

## 2021-01-17 MED ORDER — MIDODRINE HCL 5 MG PO TABS
10.0000 mg | ORAL_TABLET | Freq: Three times a day (TID) | ORAL | Status: DC
  Administered 2021-01-18 – 2021-01-28 (×32): 10 mg via ORAL
  Filled 2021-01-17 (×32): qty 2

## 2021-01-17 MED ORDER — VANCOMYCIN HCL 2000 MG/400ML IV SOLN
2000.0000 mg | Freq: Once | INTRAVENOUS | Status: AC
  Administered 2021-01-17: 2000 mg via INTRAVENOUS
  Filled 2021-01-17: qty 400

## 2021-01-17 MED ORDER — LACTATED RINGERS IV BOLUS (SEPSIS)
1000.0000 mL | Freq: Once | INTRAVENOUS | Status: DC
  Administered 2021-01-17: 1000 mL via INTRAVENOUS

## 2021-01-17 NOTE — ED Provider Notes (Signed)
[name] EMERGENCY DEPARTMENT Provider Note   CSN: [id number] Arrival date & time: 01/17/21  1953     History Chief Complaint  Patient presents with   Leg Pain    Kyle Walton is a 65 y.o. male.  HPI Level 5 caveat, patient difficult historian, facility not answering 64 year old male with a history of DM type II, MI, paroxysmal A. fib, CHF with a unknown EF presents to the ER with complaints of right leg swelling. History is limited, patient was admitted to the hospital May 27 with sepsis from his right lower leg cellulitis.  He was discharged on 6/23.-Facility unfortunately not answering the phone so my history is very limited about the course of his time since discharge.  I attempted to contact his wife who also did not answer the phone.  Patient is able to answer some questions appropriately but cannot contribute to the history.  He is ESRD on dialysis Tuesday Thursday Saturday but apparently did go to dialysis today.  I did review his notes from prior admission and those noted that his dialysis days were MWF. Patient was noted to be hypoxic with EMS, placed on 2 L, sats improved to 95%.  Does have history of sleep apnea.    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   Cardiopulmonary arrest with successful resuscitation ([place]) 10/27/2020   Diabetes mellitus with peripheral vascular disease ([place]) 10/27/2020   History of DVT (deep vein thrombosis)  10/27/2020   Essential hypertension 10/27/2020   OSA (obstructive sleep apnea) 10/27/2020   Paroxysmal atrial fibrillation ([place]) 10/27/2020   Secondary hyperparathyroidism, renal ([place]) 10/27/2020   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 ([place]) 10/27/2020   Chronic diastolic congestive heart failure, NYHA class 4 ([place]) 10/27/2020   ESRD on hemodialysis ([place]) 10/27/2020   Impaired physical mobility 10/27/2020   Chronic respiratory failure with hypercapnia ([place]) 10/27/2020   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 No pertinent family history.  Social History   Tobacco Use   Smoking status: Never   Smokeless tobacco: Never  Substance Use Topics   Alcohol use: Not Currently   Drug use: Not Currently    Home Medications Prior to Admission medications   Medication Sig Start Date End Date Taking? Authorizing Provider  Amino Acids-Protein Hydrolys (FEEDING SUPPLEMENT, PRO-STAT SUGAR FREE 64,) LIQD Take 30 mLs by mouth in the morning and at bedtime. (0900 & 2100)    [provider]  amiodarone (PACERONE) 200 MG tablet Take  1 tablet (200 mg total) by mouth 2 (two) times daily. Patient not taking: No sig reported 01/06/21   Sherran Needs, NP  amiodarone (PACERONE) 400 MG tablet Take 400 mg by mouth in the morning and at bedtime. (0900 & 1700)    [provider]  apixaban (ELIQUIS) 5 MG TABS tablet Take 1 tablet (5 mg total) by mouth 2 (two) times daily. Patient taking differently: Take 5 mg by mouth 2 (two) times daily. (0900 & 1700) 12/20/20 01/19/21  Darliss Cheney, MD  atorvastatin (LIPITOR) 40 MG tablet Take 40 mg by mouth  every evening. (2100) 09/23/20   [provider]  calcium acetate (PHOSLO) 667 MG capsule Take 667-1,334 mg by mouth See admin instructions. Take  2 capsules (1334 mg) by mouth 3 times daily with meals (0600, 1200 & 1700). Take 1 capsule (667 mg) by mouth twice daily with snacks (0900 & 2100).    [provider]  cholestyramine light (PREVALITE) 4 g packet Take 1 packet (4 g total) by mouth every 12 (twelve) hours. Patient taking differently: Take 4 g by mouth every 12 (twelve) hours. (0900 & 2100) 12/20/20 01/19/21  Darliss Cheney, MD  clonazePAM (KLONOPIN) 0.5 MG tablet Take 0.25 mg by mouth 2 (two) times daily. (0730 & 1600)    [provider]  clopidogrel (PLAVIX) 75 MG tablet Take 75 mg by mouth in the morning. (0900) 10/13/20   [provider]  digoxin 62.5 MCG TABS Take 0.0625 mg by mouth every other day. Patient not taking: Reported on 01/11/2021 01/01/21   Kathie Dike, MD  docusate sodium (COLACE) 100 MG capsule Take 1 capsule (100 mg total) by mouth 2 (two) times daily as needed for mild constipation. Patient taking differently: Take 100 mg by mouth 2 (two) times daily. (0900 & 1700) 12/31/20   Kathie Dike, MD  fentaNYL (DURAGESIC) 25 MCG/HR Place 1 patch onto the skin every 3 (three) days. 01/01/21   Kathie Dike, MD  ferric citrate (AURYXIA) 1 GM 210 MG(Fe) tablet Take 210 mg by mouth 3 (three) times daily with meals. (0800, 1200 & 1700)    [provider]  HYDROmorphone (DILAUDID) 2 MG tablet Take 2 mg by mouth every 6 (six) hours as needed for severe pain.    [provider]  Insulin Glargine (BASAGLAR KWIKPEN) 100 UNIT/ML Inject 15 Units into the skin at bedtime. Patient not taking: No sig reported 12/20/20   Darliss Cheney, MD  insulin lispro (HUMALOG) 100 UNIT/ML KwikPen Inject 2-12 Units into the skin See admin instructions. Inject two Units to twelve Units into the skin 15 (fifteen) minutes before meals for 30 days. 180-200 = 2  units 201-250 = 5 units 251-300 = 7 units 301-400 = 12 units and call MD 08/23/20   [provider]  lidocaine (XYLOCAINE) 5 % ointment Apply topically 3 (three) times daily. Patient taking differently: Apply 1 application topically 3 (three) times daily. Applied to L chest topical every shift 12/30/20   Kathie Dike, MD  metoprolol succinate (TOPROL-XL) 25 MG 24 hr tablet Take 0.5 tablets (12.5 mg total) by mouth daily. Patient taking differently: Take 25 mg by mouth daily. 12/31/20   Kathie Dike, MD  midodrine (PROAMATINE) 10 MG tablet Take 1 tablet (10 mg total) by mouth 3 (three) times daily with meals. Patient taking differently: Take 10 mg by mouth 3 (three) times daily with meals. (0900, 1400 & 2100) 12/20/20 01/19/21  Darliss Cheney, MD  multivitamin (RENA-VIT) TABS tablet Take  1 tablet by mouth at bedtime. Patient taking differently: Take 1 tablet by mouth every evening. (2100) 12/30/20   Kathie Dike, MD  mupirocin ointment (BACTROBAN) 2 % Apply 1 application topically in the morning and at bedtime. 11/16/20   [provider]  nitroGLYCERIN (NITROSTAT) 0.4 MG SL tablet Place 0.4 mg under the tongue every 5 (five) minutes x 3 doses as needed for chest pain.    [provider]  oxyCODONE (OXY IR/ROXICODONE) 5 MG immediate release tablet Take 1 tablet (5 mg total) by mouth every 12 (twelve) hours as needed for severe pain. Patient not taking: No sig reported 12/20/20   Darliss Cheney, MD  Oxycodone HCl 10 MG TABS Take 10 mg by mouth 2 (two) times daily as needed (severe pain).    [provider]  pantoprazole (PROTONIX) 40 MG tablet Take 40 mg by mouth daily at 6 (six) AM. (0600) 10/13/20   [provider]  polyethylene glycol (MIRALAX / GLYCOLAX) 17 g packet Take 17 g by mouth daily as needed for moderate constipation. Patient not taking: No sig reported 12/31/20   Kathie Dike, MD  sevelamer carbonate (RENVELA) 800 MG tablet Take 2 tablets (1,600  mg total) by mouth 3 (three) times daily with meals. Patient taking differently: Take 1,600 mg by mouth 3 (three) times daily with meals. (0800, 1200 & 1700) 12/30/20   Kathie Dike, MD    Allergies    Morphine and Liraglutide  Review of Systems   Review of Systems Ten systems reviewed and are negative for acute change, except as noted in the HPI.   Physical Exam Updated Vital Signs BP (!) 119/106   Pulse 61   Temp 98.4 F (36.9 C) (Rectal)   Resp 17   Ht '5\' 11"'$  (1.803 m)   Wt 115.7 kg   SpO2 100%   BMI 35.57 kg/m   Physical Exam Vitals and nursing note reviewed.  Constitutional:      General: He is not in acute distress.    Appearance: He is well-developed. He is ill-appearing (Chronically ill-appearing). He is not toxic-appearing or diaphoretic.  HENT:     Head: Normocephalic and atraumatic.  Eyes:     Conjunctiva/sclera: Conjunctivae normal.  Cardiovascular:     Rate and Rhythm: Normal rate and regular rhythm.     Heart sounds: No murmur heard. Pulmonary:     Effort: Pulmonary effort is normal. No respiratory distress.     Breath sounds: Rales present.  Abdominal:     Palpations: Abdomen is soft.     Tenderness: There is no abdominal tenderness.  Musculoskeletal:        General: No tenderness or deformity.     Cervical back: Neck supple.  Skin:    General: Skin is warm and dry.     Findings: Erythema present.     Comments: Multiple areas of cellulitis to the right lower leg, left lower leg and right upper thigh.  Right and left lower leg wounds appear chronic, not overly erythematous, warm.  Right upper leg is tender to the touch, warm, erythematous, with some serosanguineous drainage.  See photos below.  No significant ulcer noted to the sacrum, though he does have the beginning stages of skin breakdown  Neurological:     General: No focal deficit present.     Mental Status: He is alert and oriented to person, place, and time.     Comments: Sleepy, but will  arouse to questioning and can answer some questions  appropriately though he cannot tell me what brings him to the ER.         ED Results / Procedures / Treatments   Labs (all labs ordered are listed, but only abnormal results are displayed) Labs Reviewed  CBC WITH DIFFERENTIAL/PLATELET - Abnormal; Notable for the following components:      Result Value   WBC 20.7 (*)    RBC 3.14 (*)    Hemoglobin 8.8 (*)    HCT 29.0 (*)    RDW 18.7 (*)    Neutro Abs 17.2 (*)    Monocytes Absolute 2.0 (*)    Abs Immature Granulocytes 0.14 (*)    All other components within normal limits  COMPREHENSIVE METABOLIC PANEL - Abnormal; Notable for the following components:   CO2 20 (*)    Glucose, Bld 202 (*)    BUN 54 (*)    Creatinine, Ser 7.32 (*)    Calcium 8.8 (*)    Albumin 2.2 (*)    GFR, Estimated 8 (*)    Anion gap 18 (*)    All other components within normal limits  BRAIN NATRIURETIC PEPTIDE - Abnormal; Notable for the following components:   B Natriuretic Peptide >4,500.0 (*)    All other components within normal limits  I-STAT ARTERIAL BLOOD GAS, ED - Abnormal; Notable for the following components:   pH, Arterial 7.195 (*)    pCO2 arterial 52.4 (*)    pO2, Arterial 60 (*)    Acid-base deficit 8.0 (*)    HCT 27.0 (*)    Hemoglobin 9.2 (*)    All other components within normal limits  TROPONIN I (HIGH SENSITIVITY) - Abnormal; Notable for the following components:   Troponin I (High Sensitivity) 420 (*)    All other components within normal limits  CULTURE, BLOOD (ROUTINE X 2)  CULTURE, BLOOD (ROUTINE X 2)  RESP PANEL BY RT-PCR (FLU A&B, COVID) ARPGX2  LACTIC ACID, PLASMA  URINALYSIS, ROUTINE W REFLEX MICROSCOPIC  LACTIC ACID, PLASMA  TROPONIN I (HIGH SENSITIVITY)    EKG None  Radiology DG Chest 2 View  Result Date: 01/17/2021 CLINICAL DATA:  Right leg pain and edema, history of missed dialysis session EXAM: CHEST - 2 VIEW COMPARISON:  12/30/2020 FINDINGS: Cardiac shadow  is enlarged. Postsurgical changes are again seen. Defibrillator is noted in place. Left jugular dialysis catheter is again noted and stable. The overall inspiratory effort is poor with crowding of the vascular markings. Some underlying vascular congestion is seen consistent with the known history of missed dialysis session. No focal infiltrate is noted. No bony abnormality is seen. IMPRESSION: Central vascular congestion related to volume overload. No other focal abnormality is noted. Electronically Signed   By: Inez Catalina M.D.   On: 01/17/2021 21:40    Procedures .Critical Care  Date/Time: 01/17/2021 11:46 PM Performed by: Garald Balding, PA-C Authorized by: Garald Balding, PA-C   Critical care provider statement:    Critical care time (minutes):  45   Critical care was necessary to treat or prevent imminent or life-threatening deterioration of the following conditions:  Sepsis, shock and respiratory failure   Critical care was time spent personally by me on the following activities:  Discussions with consultants, evaluation of patient's response to treatment, examination of patient, ordering and performing treatments and interventions, ordering and review of laboratory studies, ordering and review of radiographic studies, pulse oximetry, re-evaluation of patient's condition, obtaining history from patient or surrogate and review of old charts  Medications Ordered in ED Medications  vancomycin (VANCOREADY) IVPB 2000 mg/400 mL (2,000 mg Intravenous New Bag/Given 01/17/21 2347)  vancomycin variable dose per unstable renal function (pharmacist dosing) (has no administration in time range)  lactated ringers bolus 1,000 mL (1,000 mLs Intravenous New Bag/Given 01/17/21 2344)    ED Course  I have reviewed the triage vital signs and the nursing notes.  Pertinent labs & imaging results that were available during my care of the patient were reviewed by me and considered in my medical decision making  (see chart for details).    MDM Rules/Calculators/A&P                         65 year old male with right flank swelling presents from his facility at [place].  On arrival, he is arousable, can answer some questions appropriately but is not able to contribute much to his history.  He arrived afebrile, not tachycardic, blood pressure was stable.  Lower extremities do not appear acutely infected, however he does have a large area of cellulitis to his right upper thigh which is appear warm, erythematous, with some drainage.  He also continued to have a nonproductive cough with some scattered rales.  Patient was noted to be hypoxic with EMS, placed on 2 L, sats improved to 95%.  Labs and imaging ordered, reviewed and interpreted by me.  CBC with a leukocytosis of 20.7, which is increased from prior.  Globin stable.  CMP with elevated anion gap, renal function consistent with ESRD.  No significant electrode abnormalities.  His ABG showed a CO2 of 52.4 and pH of 7.19.  He remains saturating well at 2L. Chest x-ray with vascular congestion.  After discussion with Dr. Alvino Chapel, will place patient on BiPAP for a brief period to help improve pH.  Will initiate vancomycin treatment.  Patient with a normal lactic acid, afebrile, blood pressure remained stable, does not meet sepsis criteria at this time.  I do think he will need admission for further antibiotic treatment, likely further dialysis given hypoxia.  Patient does not have any chest pain or shortness of breath, low suspicion for PE  at this time.  Reevaluation, patient's blood pressure is now downtrending, latest reading is 98/57.  As per discussed with Dr. Alvino Chapel, will order 1 L of LR, hesitate to start him on full sepsis fluids given his hypoxia and ESRD.  Consulted critical care, they will come see and evaluate the patient and establish if he is a good candidate for ICU admission.  Care signed out to [facility]. He will oversee ICU  consult, may require hospitalist admission.   This was a shared visit with my supervising physician Dr. Alvino Chapel who independently saw and evaluated the patient & provided guidance in evaluation/management/disposition ,in agreement with care    Final Clinical Impression(s) / ED Diagnoses Final diagnoses:  Cellulitis of right lower extremity  Hypoxia  Sepsis, due to unspecified organism, unspecified whether acute organ dysfunction present [facility])    Rx / DC Orders ED Discharge Orders     None        Lyndel Safe 01/17/21 2350    Davonna Belling, MD 01/18/21 2308

## 2021-01-17 NOTE — ED Notes (Signed)
Provider at bedside at this time

## 2021-01-17 NOTE — Progress Notes (Signed)
Pharmacy Antibiotic Note  Kyle Walton is a 65 y.o. male admitted on 01/17/2021 with cellulitis.  Pharmacy has been consulted for vancomycin dosing.  Plan: Vancomycin 2gm IV x 1 then f/u HD plans  Height: '5\' 11"'$  (180.3 cm) Weight: 115.7 kg (255 lb) IBW/kg (Calculated) : 75.3  Temp (24hrs), Avg:98.2 F (36.8 C), Min:97.7 F (36.5 C), Max:98.4 F (36.9 C)  Recent Labs  Lab 01/17/21 2048 01/17/21 2106  WBC 20.7*  --   LATICACIDVEN  --  1.2    Estimated Creatinine Clearance: 16 mL/min (A) (by C-G formula based on SCr of 5.94 mg/dL (H)).    Allergies  Allergen Reactions   Morphine Other (See Comments)    Per son, Kyle Walton, pt becomes unresponsive/disoriented. Especially when given after HD tx   Liraglutide Diarrhea    Phenol Phenol    Thank you for allowing pharmacy to be a part of this patient's care.  Excell Seltzer P[place]et 01/17/2021 11:08 PM

## 2021-01-17 NOTE — ED Triage Notes (Signed)
Brought via [facility] for rt leg pain and edema. 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 Dialysis pt on Tues ,Thur, Sat however went today for a tx. Dialysis cath in lt subclavian

## 2021-01-18 ENCOUNTER — Encounter (HOSPITAL_COMMUNITY): Payer: Self-pay | Admitting: Internal Medicine

## 2021-01-18 DIAGNOSIS — E039 Hypothyroidism, unspecified: Secondary | ICD-10-CM | POA: Diagnosis present

## 2021-01-18 DIAGNOSIS — R6520 Severe sepsis without septic shock: Secondary | ICD-10-CM | POA: Diagnosis present

## 2021-01-18 DIAGNOSIS — J9601 Acute respiratory failure with hypoxia: Secondary | ICD-10-CM

## 2021-01-18 DIAGNOSIS — L0390 Cellulitis, unspecified: Secondary | ICD-10-CM | POA: Diagnosis present

## 2021-01-18 DIAGNOSIS — E1151 Type 2 diabetes mellitus with diabetic peripheral angiopathy without gangrene: Secondary | ICD-10-CM

## 2021-01-18 DIAGNOSIS — E1165 Type 2 diabetes mellitus with hyperglycemia: Secondary | ICD-10-CM | POA: Diagnosis present

## 2021-01-18 DIAGNOSIS — I480 Paroxysmal atrial fibrillation: Secondary | ICD-10-CM

## 2021-01-18 DIAGNOSIS — L02415 Cutaneous abscess of right lower limb: Secondary | ICD-10-CM | POA: Diagnosis not present

## 2021-01-18 DIAGNOSIS — L03031 Cellulitis of right toe: Secondary | ICD-10-CM

## 2021-01-18 DIAGNOSIS — E1169 Type 2 diabetes mellitus with other specified complication: Secondary | ICD-10-CM | POA: Diagnosis present

## 2021-01-18 DIAGNOSIS — E7220 Disorder of urea cycle metabolism, unspecified: Secondary | ICD-10-CM | POA: Diagnosis not present

## 2021-01-18 DIAGNOSIS — J9622 Acute and chronic respiratory failure with hypercapnia: Secondary | ICD-10-CM | POA: Diagnosis present

## 2021-01-18 DIAGNOSIS — I5033 Acute on chronic diastolic (congestive) heart failure: Secondary | ICD-10-CM

## 2021-01-18 DIAGNOSIS — N2581 Secondary hyperparathyroidism of renal origin: Secondary | ICD-10-CM | POA: Diagnosis present

## 2021-01-18 DIAGNOSIS — E872 Acidosis: Secondary | ICD-10-CM | POA: Diagnosis present

## 2021-01-18 DIAGNOSIS — R6521 Severe sepsis with septic shock: Secondary | ICD-10-CM

## 2021-01-18 DIAGNOSIS — A4153 Sepsis due to Serratia: Secondary | ICD-10-CM | POA: Diagnosis present

## 2021-01-18 DIAGNOSIS — N186 End stage renal disease: Secondary | ICD-10-CM | POA: Diagnosis present

## 2021-01-18 DIAGNOSIS — L89152 Pressure ulcer of sacral region, stage 2: Secondary | ICD-10-CM | POA: Diagnosis present

## 2021-01-18 DIAGNOSIS — L03115 Cellulitis of right lower limb: Secondary | ICD-10-CM

## 2021-01-18 DIAGNOSIS — Z992 Dependence on renal dialysis: Secondary | ICD-10-CM | POA: Diagnosis not present

## 2021-01-18 DIAGNOSIS — Z6835 Body mass index (BMI) 35.0-35.9, adult: Secondary | ICD-10-CM | POA: Diagnosis not present

## 2021-01-18 DIAGNOSIS — Z20822 Contact with and (suspected) exposure to covid-19: Secondary | ICD-10-CM | POA: Diagnosis present

## 2021-01-18 DIAGNOSIS — M60051 Infective myositis, right thigh: Secondary | ICD-10-CM | POA: Diagnosis present

## 2021-01-18 DIAGNOSIS — G9340 Encephalopathy, unspecified: Secondary | ICD-10-CM | POA: Diagnosis present

## 2021-01-18 DIAGNOSIS — D631 Anemia in chronic kidney disease: Secondary | ICD-10-CM | POA: Diagnosis present

## 2021-01-18 DIAGNOSIS — D62 Acute posthemorrhagic anemia: Secondary | ICD-10-CM | POA: Diagnosis not present

## 2021-01-18 DIAGNOSIS — A419 Sepsis, unspecified organism: Secondary | ICD-10-CM | POA: Diagnosis present

## 2021-01-18 DIAGNOSIS — J9621 Acute and chronic respiratory failure with hypoxia: Secondary | ICD-10-CM

## 2021-01-18 DIAGNOSIS — I9589 Other hypotension: Secondary | ICD-10-CM

## 2021-01-18 DIAGNOSIS — Z7409 Other reduced mobility: Secondary | ICD-10-CM

## 2021-01-18 DIAGNOSIS — G4733 Obstructive sleep apnea (adult) (pediatric): Secondary | ICD-10-CM

## 2021-01-18 DIAGNOSIS — E8729 Other acidosis: Secondary | ICD-10-CM | POA: Diagnosis present

## 2021-01-18 DIAGNOSIS — E038 Other specified hypothyroidism: Secondary | ICD-10-CM | POA: Diagnosis present

## 2021-01-18 DIAGNOSIS — E1122 Type 2 diabetes mellitus with diabetic chronic kidney disease: Secondary | ICD-10-CM | POA: Diagnosis present

## 2021-01-18 DIAGNOSIS — E1152 Type 2 diabetes mellitus with diabetic peripheral angiopathy with gangrene: Secondary | ICD-10-CM | POA: Diagnosis present

## 2021-01-18 DIAGNOSIS — I959 Hypotension, unspecified: Secondary | ICD-10-CM | POA: Diagnosis present

## 2021-01-18 DIAGNOSIS — I96 Gangrene, not elsewhere classified: Secondary | ICD-10-CM | POA: Diagnosis present

## 2021-01-18 DIAGNOSIS — I10 Essential (primary) hypertension: Secondary | ICD-10-CM

## 2021-01-18 DIAGNOSIS — G9341 Metabolic encephalopathy: Secondary | ICD-10-CM | POA: Diagnosis present

## 2021-01-18 DIAGNOSIS — I132 Hypertensive heart and chronic kidney disease with heart failure and with stage 5 chronic kidney disease, or end stage renal disease: Secondary | ICD-10-CM | POA: Diagnosis present

## 2021-01-18 DIAGNOSIS — R0902 Hypoxemia: Secondary | ICD-10-CM | POA: Diagnosis present

## 2021-01-18 LAB — I-STAT VENOUS BLOOD GAS, ED
Acid-base deficit: 11 mmol/L — ABNORMAL HIGH (ref 0.0–2.0)
Acid-base deficit: 7 mmol/L — ABNORMAL HIGH (ref 0.0–2.0)
Bicarbonate: 20.7 mmol/L (ref 20.0–28.0)
Bicarbonate: 19 mmol/L — ABNORMAL LOW (ref 20.0–28.0)
Calcium, Ion: 1.17 mmol/L (ref 1.15–1.40)
Calcium, Ion: 1.1 mmol/L — ABNORMAL LOW (ref 1.15–1.40)
HCT: 61 % — ABNORMAL HIGH (ref 39.0–52.0)
HCT: 28 % — ABNORMAL LOW (ref 39.0–52.0)
Hemoglobin: 20.7 g/dL — ABNORMAL HIGH (ref 13.0–17.0)
Hemoglobin: 9.5 g/dL — ABNORMAL LOW (ref 13.0–17.0)
O2 Saturation: 99 %
O2 Saturation: 98 %
Potassium: 4.3 mmol/L (ref 3.5–5.1)
Potassium: 4.2 mmol/L (ref 3.5–5.1)
Sodium: 138 mmol/L (ref 135–145)
Sodium: 137 mmol/L (ref 135–145)
TCO2: 23 mmol/L (ref 22–32)
TCO2: 20 mmol/L — ABNORMAL LOW (ref 22–32)
pCO2, Ven: 67.5 mmHg — ABNORMAL HIGH (ref 44.0–60.0)
pCO2, Ven: 37.3 mmHg — ABNORMAL LOW (ref 44.0–60.0)
pH, Ven: 7.096 — CL (ref 7.250–7.430)
pH, Ven: 7.316 (ref 7.250–7.430)
pO2, Ven: 161 mmHg — ABNORMAL HIGH (ref 32.0–45.0)
pO2, Ven: 109 mmHg — ABNORMAL HIGH (ref 32.0–45.0)

## 2021-01-18 LAB — CBC WITH DIFFERENTIAL/PLATELET
Abs Immature Granulocytes: 0.14 10*3/uL — ABNORMAL HIGH (ref 0.00–0.07)
Basophils Absolute: 0.1 10*3/uL (ref 0.0–0.1)
Basophils Relative: 0 %
Eosinophils Absolute: 0.1 10*3/uL (ref 0.0–0.5)
Eosinophils Relative: 1 %
HCT: 27.8 % — ABNORMAL LOW (ref 39.0–52.0)
Hemoglobin: 8.6 g/dL — ABNORMAL LOW (ref 13.0–17.0)
Immature Granulocytes: 1 %
Lymphocytes Relative: 6 %
Lymphs Abs: 1.2 10*3/uL (ref 0.7–4.0)
MCH: 28.9 pg (ref 26.0–34.0)
MCHC: 30.9 g/dL (ref 30.0–36.0)
MCV: 93.3 fL (ref 80.0–100.0)
Monocytes Absolute: 1.7 10*3/uL — ABNORMAL HIGH (ref 0.1–1.0)
Monocytes Relative: 9 %
Neutro Abs: 16.5 10*3/uL — ABNORMAL HIGH (ref 1.7–7.7)
Neutrophils Relative %: 83 %
Platelets: 228 10*3/uL (ref 150–400)
RBC: 2.98 MIL/uL — ABNORMAL LOW (ref 4.22–5.81)
RDW: 18.9 % — ABNORMAL HIGH (ref 11.5–15.5)
WBC: 19.7 10*3/uL — ABNORMAL HIGH (ref 4.0–10.5)
nRBC: 0 % (ref 0.0–0.2)

## 2021-01-18 LAB — BLOOD GAS, ARTERIAL
Acid-base deficit: 3.8 mmol/L — ABNORMAL HIGH (ref 0.0–2.0)
Bicarbonate: 20.6 mmol/L (ref 20.0–28.0)
FIO2: 21
O2 Saturation: 86.9 %
Patient temperature: 36.9
pCO2 arterial: 36.5 mmHg (ref 32.0–48.0)
pH, Arterial: 7.371 (ref 7.350–7.450)
pO2, Arterial: 56.1 mmHg — ABNORMAL LOW (ref 83.0–108.0)

## 2021-01-18 LAB — COMPREHENSIVE METABOLIC PANEL
ALT: 8 U/L (ref 0–44)
AST: 27 U/L (ref 15–41)
Albumin: 2.1 g/dL — ABNORMAL LOW (ref 3.5–5.0)
Alkaline Phosphatase: 88 U/L (ref 38–126)
Anion gap: 19 — ABNORMAL HIGH (ref 5–15)
BUN: 56 mg/dL — ABNORMAL HIGH (ref 8–23)
CO2: 17 mmol/L — ABNORMAL LOW (ref 22–32)
Calcium: 8.8 mg/dL — ABNORMAL LOW (ref 8.9–10.3)
Chloride: 102 mmol/L (ref 98–111)
Creatinine, Ser: 7.26 mg/dL — ABNORMAL HIGH (ref 0.61–1.24)
GFR, Estimated: 8 mL/min — ABNORMAL LOW (ref >60)
Glucose, Bld: 146 mg/dL — ABNORMAL HIGH (ref 70–99)
Potassium: 4.4 mmol/L (ref 3.5–5.1)
Sodium: 138 mmol/L (ref 135–145)
Total Bilirubin: 1.2 mg/dL (ref 0.3–1.2)
Total Protein: 5.8 g/dL — ABNORMAL LOW (ref 6.5–8.1)

## 2021-01-18 LAB — AMMONIA: Ammonia: 61 umol/L — ABNORMAL HIGH (ref 9–35)

## 2021-01-18 LAB — PROCALCITONIN
Procalcitonin: 1.24 ng/mL
Procalcitonin: 1.3 ng/mL

## 2021-01-18 LAB — PROTIME-INR
INR: 2.6 — ABNORMAL HIGH (ref 0.8–1.2)
Prothrombin Time: 28.2 seconds — ABNORMAL HIGH (ref 11.4–15.2)

## 2021-01-18 LAB — HIV ANTIBODY (ROUTINE TESTING W REFLEX): HIV Screen 4th Generation wRfx: NONREACTIVE

## 2021-01-18 LAB — RESP PANEL BY RT-PCR (FLU A&B, COVID) ARPGX2
Influenza A by PCR: NEGATIVE
Influenza B by PCR: NEGATIVE
SARS Coronavirus 2 by RT PCR: NEGATIVE

## 2021-01-18 LAB — MAGNESIUM
Magnesium: 1.6 mg/dL — ABNORMAL LOW (ref 1.7–2.4)
Magnesium: 1.6 mg/dL — ABNORMAL LOW (ref 1.7–2.4)

## 2021-01-18 LAB — TROPONIN I (HIGH SENSITIVITY)
Troponin I (High Sensitivity): 416 ng/L (ref <18)
Troponin I (High Sensitivity): 383 ng/L (ref <18)

## 2021-01-18 LAB — CBG MONITORING, ED: Glucose-Capillary: 120 mg/dL — ABNORMAL HIGH (ref 70–99)

## 2021-01-18 LAB — HEPATITIS B SURFACE ANTIGEN: Hepatitis B Surface Ag: NONREACTIVE

## 2021-01-18 LAB — LACTIC ACID, PLASMA
Lactic Acid, Venous: 1.4 mmol/L (ref 0.5–1.9)
Lactic Acid, Venous: 1.6 mmol/L (ref 0.5–1.9)
Lactic Acid, Venous: 1.4 mmol/L (ref 0.5–1.9)

## 2021-01-18 LAB — PHOSPHORUS: Phosphorus: 5.3 mg/dL — ABNORMAL HIGH (ref 2.5–4.6)

## 2021-01-18 LAB — TSH: TSH: 9.777 u[IU]/mL — ABNORMAL HIGH (ref 0.350–4.500)

## 2021-01-18 LAB — GLUCOSE, CAPILLARY
Glucose-Capillary: 151 mg/dL — ABNORMAL HIGH (ref 70–99)
Glucose-Capillary: 150 mg/dL — ABNORMAL HIGH (ref 70–99)

## 2021-01-18 MED ORDER — HEPARIN SODIUM (PORCINE) 1000 UNIT/ML IJ SOLN
INTRAMUSCULAR | Status: AC
  Filled 2021-01-18: qty 4

## 2021-01-18 MED ORDER — LEVOTHYROXINE SODIUM 25 MCG PO TABS
25.0000 ug | ORAL_TABLET | Freq: Every day | ORAL | Status: DC
  Administered 2021-01-18: 25 ug via ORAL
  Filled 2021-01-18: qty 1

## 2021-01-18 MED ORDER — INSULIN ASPART 100 UNIT/ML IJ SOLN
0.0000 [IU] | Freq: Four times a day (QID) | INTRAMUSCULAR | Status: DC
  Administered 2021-01-18: 1 [IU] via SUBCUTANEOUS

## 2021-01-18 MED ORDER — ACETAMINOPHEN 650 MG RE SUPP: 650.0000 mg | Freq: Four times a day (QID) | RECTAL | Status: DC | PRN

## 2021-01-18 MED ORDER — ATORVASTATIN CALCIUM 40 MG PO TABS
40.0000 mg | ORAL_TABLET | Freq: Every evening | ORAL | Status: DC
  Administered 2021-01-18 – 2021-01-28 (×10): 40 mg via ORAL
  Filled 2021-01-18 (×10): qty 1

## 2021-01-18 MED ORDER — ALBUMIN HUMAN 25 % IV SOLN
50.0000 g | Freq: Once | INTRAVENOUS | Status: AC
  Filled 2021-01-18: qty 200

## 2021-01-18 MED ORDER — LIDOCAINE-PRILOCAINE 2.5-2.5 % EX CREA
1.0000 "application " | TOPICAL_CREAM | CUTANEOUS | Status: DC | PRN
  Filled 2021-01-18: qty 5

## 2021-01-18 MED ORDER — LIDOCAINE HCL (PF) 1 % IJ SOLN: 5.0000 mL | INTRAMUSCULAR | Status: DC | PRN

## 2021-01-18 MED ORDER — CLOPIDOGREL BISULFATE 75 MG PO TABS
75.0000 mg | ORAL_TABLET | Freq: Every morning | ORAL | Status: DC
  Administered 2021-01-18 – 2021-01-20 (×3): 75 mg via ORAL
  Filled 2021-01-18 (×3): qty 1

## 2021-01-18 MED ORDER — PENTAFLUOROPROP-TETRAFLUOROETH EX AERO
1.0000 "application " | INHALATION_SPRAY | CUTANEOUS | Status: DC | PRN
  Filled 2021-01-18: qty 116

## 2021-01-18 MED ORDER — SODIUM CHLORIDE 0.9 % IV SOLN: 100.0000 mL | INTRAVENOUS | Status: DC | PRN

## 2021-01-18 MED ORDER — SODIUM CHLORIDE 0.9 % IV SOLN
1.0000 g | INTRAVENOUS | Status: DC
  Administered 2021-01-18 – 2021-01-20 (×3): 1 g via INTRAVENOUS
  Filled 2021-01-18 (×3): qty 10

## 2021-01-18 MED ORDER — ALTEPLASE 2 MG IJ SOLR: 2.0000 mg | Freq: Once | INTRAMUSCULAR | Status: DC | PRN

## 2021-01-18 MED ORDER — METOPROLOL SUCCINATE ER 25 MG PO TB24
12.5000 mg | ORAL_TABLET | Freq: Every day | ORAL | Status: DC
  Administered 2021-01-18 – 2021-01-20 (×2): 12.5 mg via ORAL
  Filled 2021-01-18 (×2): qty 1

## 2021-01-18 MED ORDER — APIXABAN 5 MG PO TABS
5.0000 mg | ORAL_TABLET | Freq: Two times a day (BID) | ORAL | Status: DC
  Administered 2021-01-18 – 2021-01-20 (×5): 5 mg via ORAL
  Filled 2021-01-18 (×5): qty 1

## 2021-01-18 MED ORDER — SODIUM THIOSULFATE 250 MG/ML IV SOLN
25.0000 g | INTRAVENOUS | Status: DC
  Administered 2021-01-19: 25 g via INTRAVENOUS
  Filled 2021-01-18 (×4): qty 100

## 2021-01-18 MED ORDER — ACETAMINOPHEN 325 MG PO TABS
650.0000 mg | ORAL_TABLET | Freq: Four times a day (QID) | ORAL | Status: DC | PRN
  Administered 2021-01-18 – 2021-01-26 (×4): 650 mg via ORAL
  Filled 2021-01-18 (×4): qty 2

## 2021-01-18 MED ORDER — MAGNESIUM SULFATE 2 GM/50ML IV SOLN
2.0000 g | Freq: Once | INTRAVENOUS | Status: AC
  Administered 2021-01-18: 2 g via INTRAVENOUS
  Filled 2021-01-18: qty 50

## 2021-01-18 MED ORDER — CALCIUM ACETATE (PHOS BINDER) 667 MG PO CAPS
1334.0000 mg | ORAL_CAPSULE | Freq: Three times a day (TID) | ORAL | Status: DC
  Administered 2021-01-19 – 2021-01-21 (×5): 1334 mg via ORAL
  Filled 2021-01-18 (×7): qty 2

## 2021-01-18 MED ORDER — HEPARIN SODIUM (PORCINE) 1000 UNIT/ML DIALYSIS
1000.0000 [IU] | INTRAMUSCULAR | Status: DC | PRN
  Filled 2021-01-18: qty 1

## 2021-01-18 MED ORDER — SODIUM THIOSULFATE 250 MG/ML IV SOLN
25.0000 g | Freq: Once | INTRAVENOUS | Status: DC
  Filled 2021-01-18 (×2): qty 100

## 2021-01-18 MED ORDER — ALBUMIN HUMAN 25 % IV SOLN
INTRAVENOUS | Status: AC
  Administered 2021-01-18: 50 g via INTRAVENOUS
  Filled 2021-01-18: qty 100

## 2021-01-18 MED ORDER — CHLORHEXIDINE GLUCONATE CLOTH 2 % EX PADS
6.0000 | MEDICATED_PAD | Freq: Every day | CUTANEOUS | Status: DC
  Administered 2021-01-19 – 2021-01-21 (×3): 6 via TOPICAL

## 2021-01-18 MED ORDER — MIDODRINE HCL 5 MG PO TABS
ORAL_TABLET | ORAL | Status: AC
  Filled 2021-01-18: qty 2

## 2021-01-18 MED ORDER — LEVOTHYROXINE SODIUM 50 MCG PO TABS
50.0000 ug | ORAL_TABLET | Freq: Every day | ORAL | Status: DC
  Administered 2021-01-19 – 2021-01-20 (×2): 50 ug via ORAL
  Filled 2021-01-18 (×2): qty 1

## 2021-01-18 MED ORDER — ALBUMIN HUMAN 25 % IV SOLN
INTRAVENOUS | Status: AC
  Filled 2021-01-18: qty 100

## 2021-01-18 MED ORDER — AMIODARONE HCL 200 MG PO TABS
400.0000 mg | ORAL_TABLET | Freq: Two times a day (BID) | ORAL | Status: DC
  Administered 2021-01-18: 400 mg via ORAL
  Filled 2021-01-18: qty 2

## 2021-01-18 MED ORDER — INSULIN ASPART 100 UNIT/ML IJ SOLN
0.0000 [IU] | INTRAMUSCULAR | Status: DC
  Administered 2021-01-19 – 2021-01-22 (×12): 1 [IU] via SUBCUTANEOUS
  Administered 2021-01-23: 2 [IU] via SUBCUTANEOUS
  Administered 2021-01-23 – 2021-01-25 (×8): 1 [IU] via SUBCUTANEOUS
  Administered 2021-01-25: 2 [IU] via SUBCUTANEOUS
  Administered 2021-01-26 (×2): 1 [IU] via SUBCUTANEOUS
  Administered 2021-01-26: 2 [IU] via SUBCUTANEOUS
  Administered 2021-01-27 (×4): 1 [IU] via SUBCUTANEOUS
  Administered 2021-01-28: 2 [IU] via SUBCUTANEOUS

## 2021-01-18 MED ORDER — SORBITOL 70 % SOLN
300.0000 mL | TOPICAL_OIL | Freq: Once | ORAL | Status: AC
  Administered 2021-01-18: 300 mL via RECTAL
  Filled 2021-01-18: qty 90

## 2021-01-18 NOTE — ED Notes (Signed)
Provider at bedside

## 2021-01-18 NOTE — ED Notes (Signed)
Per provider can take pt off of bipap and monitor his mentation status and can give him something to eat

## 2021-01-18 NOTE — ED Notes (Signed)
Pt finished eating and requested to go back on the bipap machine. resp at bedside to place pt back on at this time

## 2021-01-18 NOTE — Progress Notes (Signed)
Attempted report 

## 2021-01-18 NOTE — Progress Notes (Signed)
Hemodialysis- Tolerated treatment well. UF 3L with addition of midodrine and albumin. Sats currently 100% on 3L [place]. Patient denies complaints other than wants to order dinner tray. In for bed on 3E, awaiting room to be cleaned. Currently sleeping.

## 2021-01-18 NOTE — Procedures (Signed)
   I was present at this dialysis session, have reviewed the session itself and made  appropriate changes Kelly Splinter MD [place] pager [phone]   01/18/2021, 6:07 PM

## 2021-01-18 NOTE — ED Notes (Signed)
Pt turned to rt side per his request. Pt provided something to drink and crackers with cheese at this time as well

## 2021-01-18 NOTE — Progress Notes (Signed)
Code Sepsis tracking by eLINK 

## 2021-01-18 NOTE — ED Notes (Signed)
hosp provider at bedside

## 2021-01-18 NOTE — ED Notes (Signed)
Provider at bedside at this time

## 2021-01-18 NOTE — H&P (Signed)
History and Physical    PLEASE NOTE THAT DRAGON DICTATION SOFTWARE WAS USED IN THE CONSTRUCTION OF THIS NOTE.   Kyle Walton VEH:[id number] DOB: Jun 16, 1956 DOA: 01/17/2021  PCP: Patient, No Pcp Per (Inactive) Patient coming from: SNF  I have personally briefly reviewed patient's old medical records in [place]  Chief Complaint: Somnolence  HPI: Kyle Walton is a 65 y.o. male with medical history significant for end-stage renal disease on hemodialysis on Monday, Wednesday, Friday schedule, anemia of chronic kidney disease with baseline hemoglobin 9-11, type 2 diabetes mellitus, chronic diastolic heart failure, paroxysmal atrial fibrillation chronically anticoagulated on Eliquis, chronic hypoxic hypercapnic respiratory failure on 2 L continuous nasal cannula as well as nocturnal BiPAP, acquired hypothyroidism, who is admitted to [facility] on 01/17/2021 with acute encephalopathy after presenting from SNF to [facility] ED for evaluation of increased somnolence.   The patient was brought over to [facility] emergency department today from SNF for evaluation of 1 to 2 days of progressive somnolence and confusion.  SNF staff conveyed worsening edema of the right lower extremity distal to the right knee, without any associated purulent drainage reported.  No reported preceding trauma or known COVID-19 exposures.  Per my encounter with the patient, he is somnolent, and will briefly open his eyes and began to answer questions posed to him, before falling back asleep.  He conveyed to me that he is without acute complaint at this time.  He has a document history of end-stage renal disease on hemodialysis on Monday, Wednesday, Friday schedule, and reportedly missed his scheduled Monday hemodialysis session.  He also has a documented history of chronic hypoxic hypercapnic respiratory failure on 2 L continuous nasal cannula as well as nocturnal BiPAP.  He also has a history of obstructive sleep apnea  as well as chronic diastolic heart failure, with review of records available in care everywhere, yielding most recent echocardiogram performed in December 2016 demonstrating LVEF 50 to 55% with evidence of diastolic dysfunction.  He also has a history of paroxysmal atrial fibrillation for which she is chronically anticoagulated on Eliquis.      ED Course:  Vital signs in the ED were notable for the following: Tetramex 98.4, heart rate 61-78; blood pressure is ranged from 89/45 -104/62, with most recent blood pressure 98/57, respiratory rate 15-22; oxygen saturation 94 to 97% on 2 L [place].  In the setting of presenting ABG demonstrating 7.1 95/52/60/20.4, the patient was started on BiPAP in the ED. his responsiveness appeared to improve while on BiPAP, and briefly refused additional BiPAP, however in the setting of repeat blood gas, this time VBG, which was performed after prolonged period off of BiPAP, demonstrated 7.096/67.5, the patient verbalized his consent to resuming BiPAP.  Labs were notable for the following: CMP was notable for the following: Sodium 138, potassium 4.3, bicarbonate 20, anion gap 18, glucose 202, liver enzymes were found to be within normal limits.  BNP greater than 4500.  High-sensitivity troponin IV x1 found to be 420, with no prior high-sensitivity troponin I data points available for point of comparison.  CBC notable for white cell count of 20,000, hemoglobin 9.2 relative to baseline hemoglobin range of 9-11.  Lactate 1.2.  Nasopharyngeal COVID-19/influenza PCR were checked in the ED this evening found to be negative.  Blood cultures x2 were collected prior to initiation of IV antibiotics.  EKG showed sinus rhythm with heart rate 78, intraventricular conduction delay, QRS 185, and no evidence of T wave or ST changes.  Chest x-ray showed central vascular congestion consistent with volume overload, but showed no evidence of infiltrate, pleural effusion, or pneumothorax.  In the  setting of borderline hypotension and presenting volume overload after having missed his scheduled hemodialysis session, the critical care service was consulted but felt that the patient's admission was appropriate for the hospitalist service.  Subsequently, the on-call nephrologist, Dr. Joelyn Oms, was consulted for assistance in arranging for hemodialysis: HD vs CVVH.  While in the ED, the following were administered: Midodrine 10 mg p.o. x1, IV vancomycin, lactated Ringer's x1 L bolus.  Subsequently, the patient was admitted to the PCU for further evaluation management of his presenting acute encephalopathy complicated by acute on chronic hypercapnic respiratory failure and severe sepsis in the setting of right lower extremity cellulitis.     Review of Systems: As per HPI otherwise 10 point review of systems negative.   Past Medical History:  Diagnosis Date   Diabetes mellitus without complication (HCC)    Diastolic heart failure (HCC)    ESRD (end stage renal disease) (HCC)    MI (myocardial infarction) (HCC)    OSA (obstructive sleep apnea)    PAF (paroxysmal atrial fibrillation) ([place])    Renal disorder    on dialysis    Past Surgical History:  Procedure Laterality Date   CORONARY ARTERY BYPASS GRAFT  2017   LIMA LAD, SVG PDA, OM3   DIALYSIS FISTULA CREATION     TOE AMPUTATION Bilateral     Social History:  reports that he has never smoked. He has never used smokeless tobacco. He reports previous alcohol use. He reports previous drug use.   Allergies  Allergen Reactions   Morphine Other (See Comments)    Per son, Kyle Walton, pt becomes unresponsive/disoriented. Especially when given after HD tx   Liraglutide Diarrhea    Phenol Phenol     History reviewed. No pertinent family history.  Family history reviewed and not pertinent    Prior to Admission medications   Medication Sig Start Date End Date Taking? Authorizing Provider  Amino Acids-Protein Hydrolys (FEEDING  SUPPLEMENT, PRO-STAT SUGAR FREE 64,) LIQD Take 30 mLs by mouth in the morning and at bedtime. (0900 & 2100)   Yes [provider]  amiodarone (PACERONE) 400 MG tablet Take 400 mg by mouth in the morning and at bedtime. (0900 & 1700)   Yes [provider]  apixaban (ELIQUIS) 5 MG TABS tablet Take 1 tablet (5 mg total) by mouth 2 (two) times daily. Patient taking differently: Take 5 mg by mouth 2 (two) times daily. (0900 & 1700) 12/20/20 01/19/21 Yes Pahwani, Einar Grad, MD  atorvastatin (LIPITOR) 40 MG tablet Take 40 mg by mouth every evening. (2100) 09/23/20  Yes [provider]  B Complex-C-Folic Acid (NEPHRO-VITE PO) Take 1 tablet by mouth at bedtime.   Yes [provider]  calcium acetate (PHOSLO) 667 MG capsule Take 1,334 mg by mouth 3 (three) times daily with meals.   Yes [provider]  cholestyramine light (PREVALITE) 4 g packet Take 1 packet (4 g total) by mouth every 12 (twelve) hours. Patient taking differently: Take 4 g by mouth every 12 (twelve) hours. (0900 & 2100) 12/20/20 01/19/21 Yes Pahwani, Einar Grad, MD  clonazePAM (KLONOPIN) 0.5 MG tablet Take 0.25 mg by mouth 2 (two) times daily. (0730 & 1600)   Yes [provider]  clonazePAM (KLONOPIN) 0.5 MG tablet Take 0.25 mg by mouth 2 (two) times daily as needed for anxiety.   Yes [provider]  clopidogrel (PLAVIX) 75 MG tablet Take 75 mg by mouth in the morning. 10/13/20  Yes [provider]  digoxin 62.5 MCG TABS Take 0.0625 mg by mouth every other day. 01/01/21  Yes Kathie Dike, MD  docusate sodium (COLACE) 100 MG capsule Take 1 capsule (100 mg total) by mouth 2 (two) times daily as needed for mild constipation. Patient taking differently: Take 100 mg by mouth 2 (two) times daily. 12/31/20  Yes Kathie Dike, MD  fentaNYL (DURAGESIC) 25 MCG/HR Place 1 patch onto the skin every 3 (three) days. 01/01/21  Yes Kathie Dike, MD  ferric citrate (AURYXIA) 1 GM 210 MG(Fe) tablet  Take 210 mg by mouth 3 (three) times daily with meals. (0800, 1200 & 1700)   Yes [provider]  HYDROmorphone (DILAUDID) 2 MG tablet Take 2 mg by mouth every 6 (six) hours as needed for severe pain.   Yes [provider]  Insulin Glargine (BASAGLAR KWIKPEN) 100 UNIT/ML Inject 15 Units into the skin at bedtime. 12/20/20  Yes Pahwani, Einar Grad, MD  insulin lispro (HUMALOG) 100 UNIT/ML KwikPen Inject 2-12 Units into the skin See admin instructions. 180-200 = 2 units,  201-250 = 5 units  251-300 = 7 units  301-400 = 12 units 08/23/20  Yes [provider]  latanoprost (XALATAN) 0.005 % ophthalmic solution Place 1 drop into both eyes at bedtime.   Yes [provider]  levothyroxine (SYNTHROID) 25 MCG tablet Take 25 mcg by mouth daily before breakfast.   Yes [provider]  lidocaine (XYLOCAINE) 5 % ointment Apply topically 3 (three) times daily. Patient taking differently: Apply 1 application topically 3 (three) times daily. Applied to L chest topical every shift 12/30/20  Yes Memon, Jolaine Artist, MD  metoprolol succinate (TOPROL-XL) 25 MG 24 hr tablet Take 0.5 tablets (12.5 mg total) by mouth daily. Patient taking differently: Take 25 mg by mouth daily. 12/31/20  Yes Kathie Dike, MD  midodrine (PROAMATINE) 10 MG tablet Take 1 tablet (10 mg total) by mouth 3 (three) times daily with meals. Patient taking differently: Take 10 mg by mouth 3 (three) times daily with meals. (0900, 1400 & 2100) 12/20/20 01/19/21 Yes Pahwani, Einar Grad, MD  Multiple Vitamin (MULTIVITAMIN) tablet Take 1 tablet by mouth daily.   Yes [provider]  mupirocin ointment (BACTROBAN) 2 % Apply 1 application topically in the morning and at bedtime. 11/16/20  Yes [provider]  nitroGLYCERIN (NITROSTAT) 0.4 MG SL tablet Place 0.4 mg under the tongue every 5 (five) minutes x 3 doses as needed for chest pain.   Yes [provider]  Oxycodone HCl 10 MG TABS Take 10 mg by mouth  2 (two) times daily as needed (severe pain).   Yes [provider]  OXYGEN Inhale 2 L into the lungs in the morning and at bedtime.   Yes [provider]  pantoprazole (PROTONIX) 40 MG tablet Take 40 mg by mouth daily at 6 (six) AM. (0600) 10/13/20  Yes [provider]  polyethylene glycol (MIRALAX / GLYCOLAX) 17 g packet Take 17 g by mouth daily as needed for moderate constipation. Patient taking differently: Take 17 g by mouth daily. 12/31/20  Yes Kathie Dike, MD  pregabalin (LYRICA) 25 MG capsule Take 25 mg by mouth 2 (two) times daily.   Yes [provider]  sevelamer carbonate (RENVELA) 800 MG tablet Take 2 tablets (1,600 mg total) by mouth 3 (three) times daily with meals. Patient taking differently: Take 1,600 mg by mouth 3 (three)  times daily with meals. (0800, 1200 & 1700) 12/30/20  Yes Kathie Dike, MD  amiodarone (PACERONE) 200 MG tablet Take 1 tablet (200 mg total) by mouth 2 (two) times daily. Patient not taking: Reported on 01/18/2021 01/06/21   Sherran Needs, NP  multivitamin (RENA-VIT) TABS tablet Take 1 tablet by mouth at bedtime. Patient not taking: Reported on 01/18/2021 12/30/20   Kathie Dike, MD  oxyCODONE (OXY IR/ROXICODONE) 5 MG immediate release tablet Take 1 tablet (5 mg total) by mouth every 12 (twelve) hours as needed for severe pain. Patient not taking: No sig reported 12/20/20   Darliss Cheney, MD     Objective    Physical Exam: Vitals:   01/17/21 2315 01/17/21 2330 01/17/21 2345 01/18/21 0119  BP: (!) 98/57 (!) 89/45 (!) 119/106   Pulse: 78 70 61 71  Resp: '14 18 17 16  ' Temp:      TempSrc:      SpO2: 100% 100% 100% 98%  Weight:      Height:        General: appears to be stated age; somnolent, patient briefly opens eyes and responds appropriately before falling back asleep;  Skin: warm, dry; erythema associated with the anterior aspect of the right lower extremity distal to the right knee and proximal to the right  ankle associated with mild swelling, increased warmth to the touch, in the absence of any associated discharge Head:  AT/[place] Mouth:  Oral mucosa membranes appear moist, normal dentition Neck: supple; trachea midline Heart:  RRR; did not appreciate any M/R/G Lungs: CTAB, did not appreciate any wheezes, rales, or rhonchi Abdomen: + BS; soft, ND, NT Vascular: 2+ pedal pulses b/l; 2+ radial pulses b/l Extremities: 2+ edema bilateral lower extremities ; erythema associated with the right lower extremity distal to the right knee, as further detailed above no muscle wasting Neuro: In the setting of the patient's current mental status and associated inability to follow instructions, unable to perform full neurologic exam at this time.  As such, assessment of strength, sensation, and cranial nerves is limited at this time. Patient noted to spontaneously move all 4 extremities. No tremors.     Labs on Admission: I have personally reviewed following labs and imaging studies  CBC: Recent Labs  Lab 01/17/21 2048 01/17/21 2201 01/18/21 0109  WBC 20.7*  --   --   NEUTROABS 17.2*  --   --   HGB 8.8* 9.2* 20.7*  HCT 29.0* 27.0* 61.0*  MCV 92.4  --   --   PLT 261  --   --    Basic Metabolic Panel: Recent Labs  Lab 01/17/21 2048 01/17/21 2201 01/18/21 0109  NA 138 137 138  K 4.3 4.1 4.3  CL 100  --   --   CO2 20*  --   --   GLUCOSE 202*  --   --   BUN 54*  --   --   CREATININE 7.32*  --   --   CALCIUM 8.8*  --   --    GFR: Estimated Creatinine Clearance: 13 mL/min (A) (by C-G formula based on SCr of 7.32 mg/dL (H)). Liver Function Tests: Recent Labs  Lab 01/17/21 2048  AST 30  ALT 8  ALKPHOS 88  BILITOT 1.2  PROT 6.6  ALBUMIN 2.2*   No results for input(s): LIPASE, AMYLASE in the last 168 hours. No results for input(s): AMMONIA in the last 168 hours. Coagulation Profile: No results for input(s): INR, PROTIME in the  last 168 hours. Cardiac Enzymes: No results for input(s):  CKTOTAL, CKMB, CKMBINDEX, TROPONINI in the last 168 hours. BNP (last 3 results) No results for input(s): PROBNP in the last 8760 hours. HbA1C: No results for input(s): HGBA1C in the last 72 hours. CBG: No results for input(s): GLUCAP in the last 168 hours. Lipid Profile: No results for input(s): CHOL, HDL, LDLCALC, TRIG, CHOLHDL, LDLDIRECT in the last 72 hours. Thyroid Function Tests: No results for input(s): TSH, T4TOTAL, FREET4, T3FREE, THYROIDAB in the last 72 hours. Anemia Panel: No results for input(s): VITAMINB12, FOLATE, FERRITIN, TIBC, IRON, RETICCTPCT in the last 72 hours. Urine analysis: No results found for: COLORURINE, APPEARANCEUR, [place], [place], GLUCOSEU, [place], BILIRUBINUR, KETONESUR, PROTEINUR, UROBILINOGEN, NITRITE, LEUKOCYTESUR  Radiological Exams on Admission: DG Chest 2 View  Result Date: 01/17/2021 CLINICAL DATA:  Right leg pain and edema, history of missed dialysis session EXAM: CHEST - 2 VIEW COMPARISON:  12/30/2020 FINDINGS: Cardiac shadow is enlarged. Postsurgical changes are again seen. Defibrillator is noted in place. Left jugular dialysis catheter is again noted and stable. The overall inspiratory effort is poor with crowding of the vascular markings. Some underlying vascular congestion is seen consistent with the known history of missed dialysis session. No focal infiltrate is noted. No bony abnormality is seen. IMPRESSION: Central vascular congestion related to volume overload. No other focal abnormality is noted. Electronically Signed   By: Inez Catalina M.D.   On: 01/17/2021 21:40     EKG: Independently reviewed, with result as described above.    Assessment/Plan   Demaurion Dicioccio is a 65 y.o. male with medical history significant for end-stage renal disease on hemodialysis on Monday, Wednesday, Friday schedule, anemia of chronic kidney disease with baseline hemoglobin 9-11, type 2 diabetes mellitus, chronic diastolic heart failure, paroxysmal atrial  fibrillation chronically anticoagulated on Eliquis, chronic hypoxic hypercapnic respiratory failure on 2 L continuous nasal cannula as well as nocturnal BiPAP, acquired hypothyroidism, who is admitted to [facility] on 01/17/2021 with acute encephalopathy after presenting from SNF to [facility] ED for evaluation of increased somnolence.    Principal Problem:   Acute encephalopathy Active Problems:   Diabetes mellitus with peripheral vascular disease (HCC)   History of DVT (deep vein thrombosis)   OSA (obstructive sleep apnea)   Paroxysmal atrial fibrillation (HCC)   ESRD on hemodialysis (HCC)   Impaired physical mobility   Acute on chronic respiratory failure with hypoxia and hypercapnia (HCC)   Severe sepsis (HCC)   Acute on chronic diastolic (congestive) heart failure (HCC)   Cellulitis   High anion gap metabolic acidosis   Hypotension   Acquired hypothyroidism     #) Acute encephalopathy: Increased confusion/somnolence over the last 1 to 2 days, which appears to be multifactorial in its influence, including metabolic contributions from severe sepsis due to right lower extremity cellulitis, missed scheduled hemodialysis on Monday, 01/17/2021, as well as evidence of element of hypercapnic encephalopathy given the presence of respiratory acidosis per presenting blood gas, with the latter appear to be further supported by patient's mental status improving while on BiPAP, before again worsening after he refused to continue wearing the BiPAP mask.  No evidence of additional underlying infectious process at this time, including negative COVID-19 screen performed in the ED today, while chest x-ray shows no evidence of infiltrate to suggest pneumonia.  No additional overt contributory electrolyte abnormalities.  Clinically, acute ischemic CVA versus seizure appear to be less likely at this time.  Plan: Further evaluation and management of presenting severe  sepsis due to right lower extremity  cellulitis, including continued IV antibiotics, as further detailed below.  I have personally consulted the on-call nephrologist, Dr. Joelyn Oms, for assistance in arranging for hemodialysis, resume BiPAP, with repeat blood gas ordered for 2 hours from now.  Repeat CMP and CBC in the morning.  Add on MMA, TSH, ammonia level.  Holding home central acting medications for now, including home as needed clonazepam as well as opioids.  Discontinue home fentanyl patch.      #) Acute on chronic type toxic hypercapnic respiratory failure: In the setting of a documented history of chronic hypoxic hypercapnic respiratory failure on 2 L continuous nasal cannula as well as nocturnal BiPAP in the setting of chronic diastolic heart failure, obstructive sleep apnea, and suspected contribution from pickwickian syndrome, presenting blood gas suggestive of acute on chronic hypercapnic respiratory failure, with associated suspected contribution towards his encephalopathic presentation.  Also appears to be an element of mild acute volume overload with presenting chest x-ray showing evidence of central vascular congestion consistent with volume overload.  Of note, nasopharyngeal COVID-19/influenza PCR found to be negative when checked in the ED today.  No evidence of infiltrate on chest x-ray to suggest underlying pneumonia, although will check procalcitonin to further evaluate for this possibility.  No evidence of underlying COPD, as there is documentation that the patient is a lifelong non-smoker.  Drinking presenting troponin found to be elevated, as further quantified below, although the interpretation of such is complicated in the setting of end-stage renal disease, with missed hemodialysis session.  Of note, EKG shows no evidence of acute ischemic changes, and, overall, ACS is felt to be less likely at this time, although we will continue to trend serial troponin and monitor on telemetry.  Plan: Resume BiPAP, with repeat blood  gas ordered for 2 hours from now, to assist with guidance of adjustment associated of settings.  Add on serum magnesium level and check serum phosphorus level.  Monitor continuous pulse oximetry.  Monitor on telemetry.  Check procalcitonin.  Continue to trend serial troponin, as above.  Consider echocardiogram if significant further escalation in troponin, although mild presenting acute volume appears to correlate with interval missed hemodialysis session, as above.  Nephrology consulted for assistance with arranging for hemodialysis.        #) Severe sepsis due to right lower extremity cellulitis: Diagnosis on the basis of report of 1 to 2 days of progressive erythema associated with the anterior aspect of right lower extremity distal to the right knee proximal to the right ankle, associated with swelling, increased warmth to the touch, but in the absence of any associated drainage.  SIRS criteria met via presenting leukocytosis as well as tachypnea.  Patient sepsis meets criteria to be considered severe in nature on the basis of concomitant endorgan damage with associated criteria met via presenting acute encephalopathy.  Of note, presenting lactate found to be 1.2.  Will refrain from administration of a 30 mL/kg IVF bolus in the context of end-stage renal disease with presenting acute volume overload.  Blood cultures x2 were collected in the ED this evening prior to initiation of IV vancomycin.  Given that criteria are met for severe cellulitis in the context of severe sepsis, will slightly broaden spectrum of IV antibiotic coverage to also include Rocephin.  No evidence of additional underlying infectious source at this time, including negative COVID-19 findings as well as chest x-ray not suggestive of underlying pneumonia, as further detailed above.  Plan: Continue IV  vancomycin and add Rocephin, as above.  Monitor for results blood cultures x2 collected in the ED this evening.  Pete CBC with  differential in the morning.  Add on serum procalcitonin.  Elevate right lower extremity to promote gravity induced reduction in associated edema.       #) Acute on chronic diastolic heart failure: Documented history of chronic diastolic heart failure, with most recent echocardiogram performed in December 2016, as further detailed above.  As the patient is an uric in the setting of end-stage renal disease, he is not on any diuretic medications at home.  Diagnosis of acutely decompensated heart failure within this history on the basis of presenting elevated BNP as well as chest x-ray showing evidence of acute volume overload, as further detailed above.  Suspect strong contribution from missed hemodialysis session, as further detailed above.  EKG shows no evidence of acute ischemic changes, as further detailed above, and ACS, overall, is felt to be less likely, although we will continue to trend serial troponin to help guide need for further assessment of such.  Plan: Monitor strict I's and O's and daily weights.  I personally consulted the on-call nephrologist for assistance in arranging for hemodialysis, as above.  Add on serum magnesium level.  Repeat CMP in the morning.  Monitor on telemetry.  Monitor continuous pulse oximetry.       #) Hypotension: Patient has a documented history of hypotension requiring scheduled midodrine on a 3 times daily basis as an outpatient.  Presenting blood pressures appear soft, with systolic blood pressures in the 90s, maintaining maps greater than 65 mmHg, which may be slightly lower than patient's baseline blood pressure, with potential contribution from presenting severe sepsis due to right lower extremity cellulitis, as further detailed below.  Of note, presenting troponin elevation is difficult to interpret in the setting of patient's end-stage renal disease and missed hemodialysis contributing to diminished renal clearance and associated increase on this basis.   We will continue to trend serial troponin and correlate clinically to help guide need for ensuing echocardiogram.  Acute pulmonary embolism is felt to be less likely in the setting of being chronically anticoagulated on Eliquis.  Given that the patient will also need hemodialysis in the setting of being volume overloaded after missing his hemodialysis session earlier today, there is the possibility of the need for CVVH, and I have consulted the on-call nephrologist to assist with arrangements/decision making regarding need for dialysis.  Plan: Further evaluation and management of severe sepsis due to right lower extremity cellulitis, as further detailed above.  Continue to trend troponin level.  Monitor on telemetry.  Monitor continuous pulse oximetry.  Continue home midodrine.  Close monitoring of ensuing blood pressure via routine vital signs.        #) End-stage renal disease: On hemodialysis on Monday, Wednesday, Friday schedule with patient having missed scheduled hemodialysis session on Monday, 01/17/2021.  In this context, and in the setting of evidence of acute volume overload as well as anion gap metabolic acidosis, he will need dialysis, with noted potential complication of provision of such in the setting of presenting hypotension, as further quantified above, raising the possibility of CVVH.  Suspect some contribution from the patient's presenting acute encephalopathy stemming from this missed hemodialysis session, although his presenting acute encephalopathy appears multifactorial, with missed hemodialysis session likely posing a less significant role relative contributions from acute on chronic hypercapnic respiratory failure as well as severe sepsis.   I have personally consulted the  on-call nephrologist, Dr. Joelyn Oms, for assistance with arranging dialysis for this patient.     Plan: Monitor strict I's and O's Daily weights.  Add on serum magnesium level and serum phosphorus level.   Repeat CMP in the morning.  Nephrology consulted, as above.  Close monitoring of ensuing blood pressure via routine vital signs.  Resume home PhosLo.       #) Type 2 diabetes mellitus: On Lantus 50 units subcu nightly as well as sliding scale Humalog with meals as an outpatient.  Presenting blood sugar noted to be 202.  While there is a mild anion gap metabolic acidosis at the time of this evening's presentation, this appears to be more likely on the basis of his missed hemodialysis session, with overall presentation much less suggestive of DKA.   Plan: We will hold home basal insulin for now.  I have ordered Accu-Cheks every 6 hours with low-dose sliding scale insulin.  Repeat CMP in the morning.        #) Paroxysmal atrial fibrillation: Documented history of such. In the setting of a CHA2DS2-VASc score of 5, there is an indication for the patient to be on chronic anticoagulation for thromboembolic prophylaxis. Consistent with this, the patient is chronically anticoagulated on Eliquis. Home AV nodal blocking regimen: Toprol-XL.  He is also on digoxin and amiodarone as an outpatient.  Most recent echocardiogram performed in December 2016, as further detailed above, per review of information available in Care Everywhere. Presenting EKG suggestive of sinus rhythm, without evidence of acute ischemic changes, as further detailed above.   Plan: monitor strict I's & O's and daily weights. Repeat BMP and CBC in the morning. Check serum magnesium level. Continue home AV nodal blocking regimen, as above.  Continue home amiodarone , digoxin, and Eliquis.  Monitor on telemetry.       #) Acquired hypothyroidism: On Synthroid as an outpatient.  Plan: In the setting of presenting acute encephalopathy, will check TSH.  Continue home dose of Synthroid for now.     DVT prophylaxis: Eliquis.   Code Status: Full code Family Communication: none Disposition Plan: Per Rounding Team Consults called: I  personally consulted on-call nephrology, Dr. Joelyn Oms, as further detailed above.   Admission status: Inpatient PCU     Of note, this patient was added by me to the following Admit List/Treatment Team: [email or web address].      PLEASE NOTE THAT DRAGON DICTATION SOFTWARE WAS USED IN THE CONSTRUCTION OF THIS NOTE.   [place] [facility] Pager [phone] From [place]  Otherwise, please contact night-coverage  [email or web address] Password [email or web address]   01/18/2021, 1:40 AM

## 2021-01-18 NOTE — Progress Notes (Signed)
PROGRESS NOTE    Kyle Walton  [id number] DOB: 1956/04/30 DOA: 01/17/2021 PCP: Patient, No Pcp Per (Inactive)     Brief Narrative:  65 y.o. WM PMHx ESRD on HD M/W/F , anemia of chronic kidney disease with baseline hemoglobin 9-11, type 2 diabetes mellitus, chronic diastolic heart failure (pacer RIGHT chest wall), Chronic paroxysmal atrial fibrillation on Eliquis, chronic hypoxic hypercapnic respiratory failure on 2 L continuous nasal cannula as well as nocturnal BiPAP, acquired hypothyroidism,   Admitted to [facility] on 01/17/2021 with acute encephalopathy after presenting from SNF to [facility] ED for evaluation of increased somnolence.    The patient was brought over to [facility] emergency department today from SNF for evaluation of 1 to 2 days of progressive somnolence and confusion.  SNF staff conveyed worsening edema of the right lower extremity distal to the right knee, without any associated purulent drainage reported.  No reported preceding trauma or known COVID-19 exposures.   Per my encounter with the patient, he is somnolent, and will briefly open his eyes and began to answer questions posed to him, before falling back asleep.  He conveyed to me that he is without acute complaint at this time.   He has a document history of end-stage renal disease on hemodialysis on Monday, Wednesday, Friday schedule, and reportedly missed his scheduled Monday hemodialysis session.  He also has a documented history of chronic hypoxic hypercapnic respiratory failure on 2 L continuous nasal cannula as well as nocturnal BiPAP.  He also has a history of obstructive sleep apnea as well as chronic diastolic heart failure, with review of records available in care everywhere, yielding most recent echocardiogram performed in December 2016 demonstrating LVEF 50 to 55% with evidence of diastolic dysfunction.  He also has a history of paroxysmal atrial fibrillation for which she is chronically anticoagulated on  Eliquis.          ED Course:  Vital signs in the ED were notable for the following: Tetramex 98.4, heart rate 61-78; blood pressure is ranged from 89/45 -104/62, with most recent blood pressure 98/57, respiratory rate 15-22; oxygen saturation 94 to 97% on 2 L [place].  In the setting of presenting ABG demonstrating 7.1 95/52/60/20.4, the patient was started on BiPAP in the ED. his responsiveness appeared to improve while on BiPAP, and briefly refused additional BiPAP, however in the setting of repeat blood gas, this time VBG, which was performed after prolonged period off of BiPAP, demonstrated 7.096/67.5, the patient verbalized his consent to resuming BiPAP.   Labs were notable for the following: CMP was notable for the following: Sodium 138, potassium 4.3, bicarbonate 20, anion gap 18, glucose 202, liver enzymes were found to be within normal limits.  BNP greater than 4500.  High-sensitivity troponin IV x1 found to be 420, with no prior high-sensitivity troponin I data points available for point of comparison.  CBC notable for white cell count of 20,000, hemoglobin 9.2 relative to baseline hemoglobin range of 9-11.  Lactate 1.2.  Nasopharyngeal COVID-19/influenza PCR were checked in the ED this evening found to be negative.  Blood cultures x2 were collected prior to initiation of IV antibiotics.   EKG showed sinus rhythm with heart rate 78, intraventricular conduction delay, QRS 185, and no evidence of T wave or ST changes.  Chest x-ray showed central vascular congestion consistent with volume overload, but showed no evidence of infiltrate, pleural effusion, or pneumothorax.   In the setting of borderline hypotension and presenting volume overload after having  missed his scheduled hemodialysis session, the critical care service was consulted but felt that the patient's admission was appropriate for the hospitalist service.  Subsequently, the on-call nephrologist, Dr. Joelyn Oms, was consulted for assistance in  arranging for hemodialysis: HD vs CVVH.   While in the ED, the following were administered: Midodrine 10 mg p.o. x1, IV vancomycin, lactated Ringer's x1 L bolus.  Subsequently, the patient was admitted to the PCU for further evaluation management of his presenting acute encephalopathy complicated by acute on chronic hypercapnic respiratory failure and severe sepsis in the setting of right lower extremity cellulitis.   Subjective: Afebrile overnight,   Assessment & Plan: Covid vaccination;   Principal Problem:   Acute encephalopathy Active Problems:   Diabetes mellitus with peripheral vascular disease ([place])   History of DVT (deep vein thrombosis)   Essential hypertension   OSA (obstructive sleep apnea)   Paroxysmal atrial fibrillation (HCC)   ESRD on hemodialysis (HCC)   Impaired physical mobility   Acute on chronic respiratory failure with hypoxia and hypercapnia (HCC)   Cellulitis of right leg   Acute on chronic diastolic (congestive) heart failure (HCC)   Cellulitis   High anion gap metabolic acidosis   Hypotension   Acquired hypothyroidism   End-stage renal disease on hemodialysis (HCC)   Dry gangrene (HCC)   Sepsis, unspecified organism ([place])   Acute encephalopathy/somnolence - Multifactorial acute on chronic respiratory failure, severe sepsis, acute CHF, missing of ESRD, OHS?. - Correct underlying causes -Minimize sedating medication  sepsis unspecified organism due to right lower extremity cellulitis/dry gangrene second LEFT metatarsal: -On admission meets criteria for sepsis RR> 20, WBC> 12 K, site of infection bilateral lower extremities RIGHT>> Left -Continue current antibiotics - Trend procalcitonin/lactic acid - CT Wo contrast RLE/LEFT foot R/O osteomyelitis.  If present will need to contact orthopedic/vascular surgery  Acute on chronic respiratory failure with hypercapnia - Continuous pulse ox -Repeat ABG - 7/13 ABG - BiPAP if ABG indicates  Acute on  chronic diastolic CHF -Amiodarone [id number] mg BID (hold) secondary to hypotension - Toprol 12.5 mg daily  Paroxysmal atrial fibrillation:  -CHA2DS2-VASc score of 5,  -See CHF  -Apixaban 5 mg BID    Hypotension -7/12 albumin 50 g x 1 - 7/12 midodrine 10 mg TID  ESRD on HD M/W/F  -missed his scheduled Monday hemodialysis session.  -7/12 seen by Dr. 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 (Active)  12/11/20 0551  Location: Sacrum  Location Orientation: Right;Left;Mid  Staging: Stage 2 -  Partial thickness loss of dermis presenting as a shallow open injury with a red, pink wound bed without slough.  Wound Description (Comments):   Present on Admission:   -Ensure area is covered and clean with soft foam pad.   Obese (BMI 35.57 kg/m) -If patient survives would benefit from nutritional guidance     DVT prophylaxis: Eliquis Code Status: Full Family Communication:  Status is: Inpatient   Dispo: The patient is from: SNF  Anticipated d/c is to: SNF              Anticipated d/c date is: > 3 days              Patient currently is not medically stable to d/c.      Consultants:  Nephrology Dr. Hal Morales  Procedures/Significant Events:    I have personally reviewed and interpreted all radiology studies and my findings are as above.  VENTILATOR SETTINGS:    Cultures   Antimicrobials: Anti-infectives (From admission, onward)    Start     Ordered Stop   01/18/21  0115  cefTRIAXone (ROCEPHIN) 1 g in sodium chloride 0.9 % 100 mL IVPB        01/18/21 0101     01/17/21 2315  vancomycin (VANCOREADY) IVPB 2000 mg/400 mL        01/17/21 2306 01/18/21 0147   01/17/21 2307  vancomycin variable dose per unstable renal function (pharmacist dosing)        01/17/21 2307           Devices    LINES / TUBES:      Continuous Infusions:  albumin human     cefTRIAXone (ROCEPHIN)  IV Stopped (01/18/21 0533)   magnesium sulfate bolus IVPB     [START ON 01/19/2021] sodium thiosulfate infusion for calciphylaxis     sodium thiosulfate infusion for calciphylaxis       Objective: Vitals:   01/18/21 1526 01/18/21 1530 01/18/21 1539 01/18/21 1817  BP: 107/72 90/64 110/62 (!) 93/43  Pulse: 79   69  Resp:   13 18  Temp:   97.9 F (36.6 C) 97.9 F (36.6 C)  TempSrc:   Oral Oral  SpO2:   100% 99%  Weight:      Height:        Intake/Output Summary (Last 24 hours) at 01/18/2021 1955 Last data filed at 01/18/2021 1539 Gross per 24 hour  Intake 927.86 ml  Output 3000 ml  Net -2072.14 ml   Filed Weights   01/17/21 2012  Weight: 115.7 kg    Examination:  General: Somnolent difficult to arouse, but will follow commands, positive acute respiratory distress Eyes: negative scleral hemorrhage, negative anisocoria, negative icterus ENT: Negative Runny nose, negative gingival bleeding, Neck:  Negative scars, masses, torticollis, lymphadenopathy, JVD Lungs: Clear to auscultation bilaterally without wheezes or crackles Cardiovascular: Irregular irregular rhythm and rate without murmur gallop or rub normal S1 and S2 Abdomen: negative abdominal pain, nondistended, positive soft, bowel sounds, no rebound, no ascites, no appreciable mass Extremities: RIGHT lower extremity spot of dry gangrene on the remaining surgical site first metatarsal, leg is swollen, erythematous.  Loss of sensation.  LEFT second metatarsal dry gangrene Skin: Multiple skin  lesions Psychiatric: Cannot fully evaluate patient somnolent, encephalopathic Central nervous system:  Cranial nerves II through XII intact, tongue/uvula midline, all extremities muscle strength 5/5, sensation lost RIGHT lower extremity from knee to foot,  negative dysarthria, negative expressive aphasia, negative receptive aphasia.  .     Data Reviewed: Care during the described time interval was provided by me .  I have reviewed this patient's available data, including medical history, events of note, physical examination, and all test results as part of my evaluation.  CBC: Recent Labs  Lab 01/17/21 2048 01/17/21 2201 01/18/21 0109 01/18/21 0342 01/18/21 0344  WBC 20.7*  --   --  19.7*  --   NEUTROABS 17.2*  --   --  16.5*  --  HGB 8.8* 9.2* 20.7* 8.6* 9.5*  HCT 29.0* 27.0* 61.0* 27.8* 28.0*  MCV 92.4  --   --  93.3  --   PLT 261  --   --  228  --    Basic Metabolic Panel: Recent Labs  Lab 01/17/21 2048 01/17/21 2201 01/18/21 0109 01/18/21 0342 01/18/21 0344 01/18/21 0907  NA 138 137 138 138 137  --   K 4.3 4.1 4.3 4.4 4.2  --   CL 100  --   --  102  --   --   CO2 20*  --   --  17*  --   --   GLUCOSE 202*  --   --  146*  --   --   BUN 54*  --   --  56*  --   --   CREATININE 7.32*  --   --  7.26*  --   --   CALCIUM 8.8*  --   --  8.8*  --   --   MG  --   --   --  1.6*  --  1.6*  PHOS  --   --   --  5.3*  --   --    GFR: Estimated Creatinine Clearance: 13.1 mL/min (A) (by C-G formula based on SCr of 7.26 mg/dL (H)). Liver Function Tests: Recent Labs  Lab 01/17/21 2048 01/18/21 0342  AST 30 27  ALT 8 8  ALKPHOS 88 88  BILITOT 1.2 1.2  PROT 6.6 5.8*  ALBUMIN 2.2* 2.1*   No results for input(s): LIPASE, AMYLASE in the last 168 hours. Recent Labs  Lab 01/18/21 0200  AMMONIA 61*   Coagulation Profile: Recent Labs  Lab 01/18/21 0342  INR 2.6*   Cardiac Enzymes: No results for input(s): CKTOTAL, CKMB, CKMBINDEX, TROPONINI in the last 168 hours. BNP  (last 3 results) No results for input(s): PROBNP in the last 8760 hours. HbA1C: No results for input(s): HGBA1C in the last 72 hours. CBG: Recent Labs  Lab 01/18/21 0553 01/18/21 1855  GLUCAP 120* 151*   Lipid Profile: No results for input(s): CHOL, HDL, LDLCALC, TRIG, CHOLHDL, LDLDIRECT in the last 72 hours. Thyroid Function Tests: Recent Labs    01/18/21 0200  TSH 9.777*   Anemia Panel: No results for input(s): VITAMINB12, FOLATE, FERRITIN, TIBC, IRON, RETICCTPCT in the last 72 hours. Sepsis Labs: Recent Labs  Lab 01/17/21 2106 01/18/21 0342 01/18/21 0907  PROCALCITON  --  1.24 1.30  LATICACIDVEN 1.2  --  1.4    Recent Results (from the past 240 hour(s))  Blood culture (routine x 2)     Status: None (Preliminary result)   Collection Time: 01/17/21  9:38 PM   Specimen: BLOOD  Result Value Ref Range Status   Specimen Description BLOOD RIGHT ARM  Final   Special Requests   Final    BOTTLES DRAWN AEROBIC AND ANAEROBIC Blood Culture adequate volume   Culture   Final    NO GROWTH < 12 HOURS Performed at [facility], [place] [address] [place], [place] [address]    Report Status PENDING  Incomplete  Blood culture (routine x 2)     Status: None (Preliminary result)   Collection Time: 01/17/21  9:40 PM   Specimen: BLOOD  Result Value Ref Range Status   Specimen Description BLOOD SITE NOT SPECIFIED  Final   Special Requests   Final    BOTTLES DRAWN AEROBIC AND ANAEROBIC Blood Culture adequate volume  Culture   Final    NO GROWTH < 12 HOURS Performed at [facility], [place] [address] [place], [place] [address]    Report Status PENDING  Incomplete  Resp Panel by RT-PCR (Flu A&B, Covid) Nasopharyngeal Swab     Status: None   Collection Time: 01/17/21  9:50 PM   Specimen: Nasopharyngeal Swab; Nasopharyngeal(NP) swabs in vial transport medium  Result Value Ref Range Status   SARS Coronavirus 2 by RT PCR NEGATIVE NEGATIVE Final    Comment: (NOTE) SARS-CoV-2  target nucleic acids are NOT DETECTED.  The SARS-CoV-2 RNA is generally detectable in upper respiratory specimens during the acute phase of infection. The lowest concentration of SARS-CoV-2 viral copies this assay can detect is 138 copies/mL. A negative result does not preclude SARS-Cov-2 infection and should not be used as the sole basis for treatment or other patient management decisions. A negative result may occur with  improper specimen collection/handling, submission of specimen other than nasopharyngeal swab, presence of viral mutation(s) within the areas targeted by this assay, and inadequate number of viral copies(<138 copies/mL). A negative result must be combined with clinical observations, patient history, and epidemiological information. The expected result is Negative.  Fact Sheet for Patients:  EntrepreneurPulse.com.au  Fact Sheet for Healthcare Providers:  IncredibleEmployment.be  This test is no t yet approved or cleared by the [place] FDA and  has been authorized for detection and/or diagnosis of SARS-CoV-2 by FDA under an Emergency Use Authorization (EUA). This EUA will remain  in effect (meaning this test can be used) for the duration of the COVID-19 declaration under Section 564(b)(1) of the Act, 21 U.S.C.section 360bbb-3(b)(1), unless the authorization is terminated  or revoked sooner.       Influenza A by PCR NEGATIVE NEGATIVE Final   Influenza B by PCR NEGATIVE NEGATIVE Final    Comment: (NOTE) The Xpert Xpress SARS-CoV-2/FLU/RSV plus assay is intended as an aid in the diagnosis of influenza from Nasopharyngeal swab specimens and should not be used as a sole basis for treatment. Nasal washings and aspirates are unacceptable for Xpert Xpress SARS-CoV-2/FLU/RSV testing.  Fact Sheet for Patients: EntrepreneurPulse.com.au  Fact Sheet for Healthcare  Providers: IncredibleEmployment.be  This test is not yet approved or cleared by the [place] FDA and has been authorized for detection and/or diagnosis of SARS-CoV-2 by FDA under an Emergency Use Authorization (EUA). This EUA will remain in effect (meaning this test can be used) for the duration of the COVID-19 declaration under Section 564(b)(1) of the Act, 21 U.S.C. section 360bbb-3(b)(1), unless the authorization is terminated or revoked.  Performed at [facility], [place] [address], [place], [place] [address]          Radiology Studies: DG Chest 2 View  Result Date: 01/17/2021 CLINICAL DATA:  Right leg pain and edema, history of missed dialysis session EXAM: CHEST - 2 VIEW COMPARISON:  12/30/2020 FINDINGS: Cardiac shadow is enlarged. Postsurgical changes are again seen. Defibrillator is noted in place. Left jugular dialysis catheter is again noted and stable. The overall inspiratory effort is poor with crowding of the vascular markings. Some underlying vascular congestion is seen consistent with the known history of missed dialysis session. No focal infiltrate is noted. No bony abnormality is seen. IMPRESSION: Central vascular congestion related to volume overload. No other focal abnormality is noted. Electronically Signed   By: Inez Catalina M.D.   On: 01/17/2021 21:40        Scheduled Meds:  amiodarone  400 mg Oral BID   apixaban  5 mg Oral BID   atorvastatin  40 mg Oral QPM   calcium acetate  1,334 mg Oral TID WC   Chlorhexidine Gluconate Cloth  6 each Topical Q0600   clopidogrel  75 mg Oral q AM   heparin sodium (porcine)       insulin aspart  0-6 Units Subcutaneous Q6H   levothyroxine  25 mcg Oral Q0600   metoprolol succinate  12.5 mg Oral Daily   midodrine  10 mg Oral TID WC   vancomycin variable dose per unstable renal function (pharmacist dosing)   Does not apply See admin instructions   Continuous Infusions:  albumin human      cefTRIAXone (ROCEPHIN)  IV Stopped (01/18/21 0533)   magnesium sulfate bolus IVPB     [START ON 01/19/2021] sodium thiosulfate infusion for calciphylaxis     sodium thiosulfate infusion for calciphylaxis       LOS: 0 days    Time spent:40 min    Sorina Derrig, Geraldo Docker, MD [facility]   If 7PM-7AM, please contact night-coverage 01/18/2021, 7:55 PM

## 2021-01-18 NOTE — ED Notes (Signed)
Pt is awake and asking for pain medication and something to snack on. Advised pt would need to reach out to the provider reference both questions

## 2021-01-18 NOTE — ED Notes (Signed)
Venous blood gas ISTAT result were abnormal.

## 2021-01-18 NOTE — ED Notes (Signed)
Woke pt up to give medication and removed the bipap machine to do so. While doing same pt stated that I woke him up all wrong and I ruined his night. Once pt took medication attempted to place pt back on the bipap and pt refused stating that I couldn't place him back on it because of how I woke him up and ruined his night. Provider made aware of same

## 2021-01-18 NOTE — ED Notes (Signed)
The skin on pt's right upper leg is very taut.

## 2021-01-18 NOTE — Consult Note (Addendum)
NAME:  Kyle Walton, MRN:  [id number], DOB:  1955-11-14, LOS: 0 ADMISSION DATE:  01/17/2021, CONSULTATION DATE:  01/18/2021 REFERRING MD:  Dr. Alvino Chapel, CHIEF COMPLAINT:  Hypotension    History of Present Illness:  65 year old male presents to ED on 7/11 with right leg pain and swelling. Recent hospitalization 5/27-6/24 for RLE cellulitis. On arrival to ED patient with hypoxia requiring 2L [place] and lethargic. ABG 7.195/52.4/60. Placed on BiPAP. CXR with central vascular congestion. WBC 20.7. Lactic Acid 1.2. Troponin 420. Crt 7.32. Given Cefepime/Vancomycin. BP on arrival 114/49 however during stay dropped to 89/45. Critical Care consulted.   On assessment patient is alert, states his BP normally runs [id number] systolic with 10 mg midodrine. Reports has not taken afternoon dose today.  Currently BP 92/55  Pertinent  Medical History  CAD/CABG, Diastolic HF, PAF/Flutter s/p AICD on Eliquis, DM, PVD s/p toe amputations, ESRD HD MWF, OSA on CPAP at HS, Chronic Hypotension on Midodrine   Significant Hospital Events: Including procedures, antibiotic start and stop dates in addition to other pertinent events   7/12 Presents to ED   Interim History / Subjective:  As above.   Objective   Blood pressure (!) 119/106, pulse 61, temperature 98.4 F (36.9 C), temperature source Rectal, resp. rate 17, height '5\' 11"'$  (1.803 m), weight 115.7 kg, SpO2 100 %.        Intake/Output Summary (Last 24 hours) at 01/18/2021 0032 Last data filed at 01/18/2021 0010 Gross per 24 hour  Intake 432.9 ml  Output --  Net 432.9 ml   Filed Weights   01/17/21 2012  Weight: 115.7 kg    Examination: General: Chronically ill appearing adult male, no distress  HENT: BiPAP in place  Lungs: Coarse, no use of accessory muscles  Cardiovascular: Irregular, HR 75, no MRG Abdomen: Obese, soft, non-tender  Extremities: RLE redness, warmth, chronic appearing vascular wounds to BLE Neuro: alert, follows commands, pupils intact  and reactive  GU: intact   Resolved Hospital Problem list     Assessment & Plan:   Cellulitis RLE -lactic acid 1.2  Acute on Chronic Hypercarbic Respiratory Failure  -CXR with pulmonary edema  Chronic Hypotension  Plan -Continue Cefepime/Vancomycin  -Follow Culture Data  -Continue BiPAP as needed  -Would limit fluids given CXR with pulmonary edema -Re-start home midodrine. Systolic Goal [id number] -Consult Nephrology in AM  At this time patient is stable for admission to Medicine Service. If condition changes please re-consult.   Best Practice (right click and "Reselect all SmartList Selections" daily)   Diet/type: NPO DVT prophylaxis: DOAC GI prophylaxis: PPI Lines: Dialysis Catheter Foley:  N/A Code Status:  full code Last date of multidisciplinary goals of care discussion [Pending. Updated patient on plan of care]  Labs   CBC: Recent Labs  Lab 01/17/21 2048 01/17/21 2201  WBC 20.7*  --   NEUTROABS 17.2*  --   HGB 8.8* 9.2*  HCT 29.0* 27.0*  MCV 92.4  --   PLT 261  --     Basic Metabolic Panel: Recent Labs  Lab 01/17/21 2048 01/17/21 2201  NA 138 137  K 4.3 4.1  CL 100  --   CO2 20*  --   GLUCOSE 202*  --   BUN 54*  --   CREATININE 7.32*  --   CALCIUM 8.8*  --    GFR: Estimated Creatinine Clearance: 13 mL/min (A) (by C-G formula based on SCr of 7.32 mg/dL (H)). Recent Labs  Lab 01/17/21 2048 01/17/21 2106  WBC 20.7*  --   LATICACIDVEN  --  1.2    Liver Function Tests: Recent Labs  Lab 01/17/21 2048  AST 30  ALT 8  ALKPHOS 88  BILITOT 1.2  PROT 6.6  ALBUMIN 2.2*   No results for input(s): LIPASE, AMYLASE in the last 168 hours. No results for input(s): AMMONIA in the last 168 hours.  ABG    Component Value Date/Time   PHART 7.195 (LL) 01/17/2021 2201   PCO2ART 52.4 (H) 01/17/2021 2201   PO2ART 60 (L) 01/17/2021 2201   HCO3 20.4 01/17/2021 2201   TCO2 22 01/17/2021 2201   ACIDBASEDEF 8.0 (H) 01/17/2021 2201   O2SAT 85.0 01/17/2021  2201     Coagulation Profile: No results for input(s): INR, PROTIME in the last 168 hours.  Cardiac Enzymes: No results for input(s): CKTOTAL, CKMB, CKMBINDEX, TROPONINI in the last 168 hours.  HbA1C: Hgb A1c MFr Bld  Date/Time Value Ref Range Status  12/04/2020 05:15 AM 9.7 (H) 4.8 - 5.6 % Final    Comment:    (NOTE)         Prediabetes: 5.7 - 6.4         Diabetes: >6.4         Glycemic control for adults with diabetes: <7.0     CBG: No results for input(s): GLUCAP in the last 168 hours.  Review of Systems:   Pain to RLE. Denies Nausea. Denies SOB.   Past Medical History:  He,  has a past medical history of Diabetes mellitus without complication ([place]), Diastolic heart failure ([place]), ESRD (end stage renal disease) ([place]), MI (myocardial infarction) ([place]), OSA (obstructive sleep apnea), PAF (paroxysmal atrial fibrillation) ([place]), and Renal disorder.   Surgical History:   Past Surgical History:  Procedure Laterality Date   CORONARY ARTERY BYPASS GRAFT  2017   LIMA LAD, SVG PDA, OM3   DIALYSIS FISTULA CREATION     TOE AMPUTATION Bilateral      Social History:   reports that he has never smoked. He has never used smokeless tobacco. He reports previous alcohol use. He reports previous drug use.   Family History:  His family history is not on file.   Allergies Allergies  Allergen Reactions   Morphine Other (See Comments)    Per son, [name], pt becomes unresponsive/disoriented. Especially when given after HD tx   Liraglutide Diarrhea    Phenol Phenol      Home Medications  Prior to Admission medications   Medication Sig Start Date End Date Taking? Authorizing Provider  Amino Acids-Protein Hydrolys (FEEDING SUPPLEMENT, PRO-STAT SUGAR FREE 64,) LIQD Take 30 mLs by mouth in the morning and at bedtime. (0900 & 2100)   Yes [provider]  amiodarone (PACERONE) 400 MG tablet Take 400 mg by mouth in the morning and at bedtime. (0900 & 1700)   Yes [provider]  apixaban (ELIQUIS) 5 MG TABS tablet Take 1 tablet (5 mg total) by mouth 2 (two) times daily. Patient taking differently: Take 5 mg by mouth 2 (two) times daily. (0900 & 1700) 12/20/20 01/19/21 Yes Pahwani, Einar Grad, MD  atorvastatin (LIPITOR) 40 MG tablet Take 40 mg by mouth every evening. (2100) 09/23/20  Yes [provider]  B Complex-C-Folic Acid (NEPHRO-VITE PO) Take 1 tablet by mouth at bedtime.   Yes [provider]  calcium acetate (PHOSLO) 667 MG capsule Take 1,334 mg by mouth 3 (three) times daily with meals.   Yes [provider]  cholestyramine light (  PREVALITE) 4 g packet Take 1 packet (4 g total) by mouth every 12 (twelve) hours. Patient taking differently: Take 4 g by mouth every 12 (twelve) hours. (0900 & 2100) 12/20/20 01/19/21 Yes Pahwani, Einar Grad, MD  clonazePAM (KLONOPIN) 0.5 MG tablet Take 0.25 mg by mouth 2 (two) times daily. (0730 & 1600)   Yes [provider]  clonazePAM (KLONOPIN) 0.5 MG tablet Take 0.25 mg by mouth 2 (two) times daily as needed for anxiety.   Yes [provider]  clopidogrel (PLAVIX) 75 MG tablet Take 75 mg by mouth in the morning. 10/13/20  Yes [provider]  digoxin 62.5 MCG TABS Take 0.0625 mg by mouth every other day. 01/01/21  Yes Kathie Dike, MD  docusate sodium (COLACE) 100 MG capsule Take 1 capsule (100 mg total) by mouth 2 (two) times daily as needed for mild constipation. Patient taking differently: Take 100 mg by mouth 2 (two) times daily. 12/31/20  Yes Kathie Dike, MD  fentaNYL (DURAGESIC) 25 MCG/HR Place 1 patch onto the skin every 3 (three) days. 01/01/21  Yes Kathie Dike, MD  ferric citrate (AURYXIA) 1 GM 210 MG(Fe) tablet Take 210 mg by mouth 3 (three) times daily with meals. (0800, 1200 & 1700)   Yes [provider]  HYDROmorphone (DILAUDID) 2 MG tablet Take 2 mg by mouth every 6 (six) hours as needed for severe pain.   Yes [provider]  Insulin Glargine  (BASAGLAR KWIKPEN) 100 UNIT/ML Inject 15 Units into the skin at bedtime. 12/20/20  Yes Pahwani, Einar Grad, MD  insulin lispro (HUMALOG) 100 UNIT/ML KwikPen Inject 2-12 Units into the skin See admin instructions. 180-200 = 2 units,  201-250 = 5 units  251-300 = 7 units  301-400 = 12 units 08/23/20  Yes [provider]  latanoprost (XALATAN) 0.005 % ophthalmic solution Place 1 drop into both eyes at bedtime.   Yes [provider]  levothyroxine (SYNTHROID) 25 MCG tablet Take 25 mcg by mouth daily before breakfast.   Yes [provider]  lidocaine (XYLOCAINE) 5 % ointment Apply topically 3 (three) times daily. Patient taking differently: Apply 1 application topically 3 (three) times daily. Applied to L chest topical every shift 12/30/20  Yes Memon, Jolaine Artist, MD  metoprolol succinate (TOPROL-XL) 25 MG 24 hr tablet Take 0.5 tablets (12.5 mg total) by mouth daily. Patient taking differently: Take 25 mg by mouth daily. 12/31/20  Yes Kathie Dike, MD  midodrine (PROAMATINE) 10 MG tablet Take 1 tablet (10 mg total) by mouth 3 (three) times daily with meals. Patient taking differently: Take 10 mg by mouth 3 (three) times daily with meals. (0900, 1400 & 2100) 12/20/20 01/19/21 Yes Pahwani, Einar Grad, MD  Multiple Vitamin (MULTIVITAMIN) tablet Take 1 tablet by mouth daily.   Yes [provider]  mupirocin ointment (BACTROBAN) 2 % Apply 1 application topically in the morning and at bedtime. 11/16/20  Yes [provider]  nitroGLYCERIN (NITROSTAT) 0.4 MG SL tablet Place 0.4 mg under the tongue every 5 (five) minutes x 3 doses as needed for chest pain.   Yes [provider]  Oxycodone HCl 10 MG TABS Take 10 mg by mouth 2 (two) times daily as needed (severe pain).   Yes [provider]  OXYGEN Inhale 2 L into the lungs in the morning and at bedtime.   Yes [provider]  pantoprazole (PROTONIX) 40 MG tablet Take 40 mg by mouth daily at 6 (six) AM. (0600)  10/13/20  Yes [provider]  polyethylene glycol (MIRALAX / GLYCOLAX) 17 g packet Take 17 g by mouth daily as needed for moderate constipation. Patient taking differently: Take 17 g by mouth daily. 12/31/20  Yes Kathie Dike, MD  pregabalin (LYRICA) 25 MG capsule Take 25 mg by mouth 2 (two) times daily.   Yes [provider]  sevelamer carbonate (RENVELA) 800 MG tablet Take 2 tablets (1,600 mg total) by mouth 3 (three) times daily with meals. Patient taking differently: Take 1,600 mg by mouth 3 (three) times daily with meals. (0800, 1200 & 1700) 12/30/20  Yes Kathie Dike, MD  amiodarone (PACERONE) 200 MG tablet Take 1 tablet (200 mg total) by mouth 2 (two) times daily. Patient not taking: Reported on 01/18/2021 01/06/21   Sherran Needs, NP  multivitamin (RENA-VIT) TABS tablet Take 1 tablet by mouth at bedtime. Patient not taking: Reported on 01/18/2021 12/30/20   Kathie Dike, MD  oxyCODONE (OXY IR/ROXICODONE) 5 MG immediate release tablet Take 1 tablet (5 mg total) by mouth every 12 (twelve) hours as needed for severe pain. Patient not taking: No sig reported 12/20/20   Darliss Cheney, MD    Hayden Pedro, AGACNP-BC [place] Pgr: [phone]

## 2021-01-18 NOTE — Consult Note (Signed)
[facility] Renal Consultation Note    Indication for Consultation:  Management of ESRD/hemodialysis; anemia, hypertension/volume and secondary hyperparathyroidism  [id number], No Pcp Per (Inactive)  HPI: Kyle Walton is a 65 y.o. male. ESRD on HD MWF at [facility] [address] in [place].  Past medical history significant for DM, CAD s/p CABGx3, diastolic CHF, Afib/flutter s/p AICD, chronic hypoxic respiratory failure on 2L O2 and BiPAP at home, obesity, PAD s/p multiple amputations, and recent diagnosis of calciphylaxis.  Recent prolonged hospitalization from 5/27-6/24 for r thigh wound suspected to be calciphylaxis, debility, and scute toxic encephalopathy.   Patient presented to the ED due to R leg pain and edema.  States he has had constant pain in his R leg which has worsened over the last few days.  He believes it is slightly more swollen than before.  Reports he missed dialysis yesterday due to abdominal pain and severe constipation x 3 days.  Denies n/v/d, CP and SOB. States appetite has been decreased, possible weight loss.  SNF reported worsened confusion and somnolence per notes.  Patient admits to fatigue.   Pertinent findings in the ED include hypotension, afebrile, CXR with central venous congestion and WBC 19.7.  Patient has been admitted for further evaluation and management.   Past Medical History:  Diagnosis Date   Diabetes mellitus without complication (HCC)    Diastolic heart failure (HCC)    ESRD (end stage renal disease) (HCC)    MI (myocardial infarction) (HCC)    OSA (obstructive sleep apnea)    PAF (paroxysmal atrial fibrillation) ([place])    Renal disorder    on dialysis   Past Surgical History:  Procedure Laterality Date   CORONARY ARTERY BYPASS GRAFT  2017   LIMA LAD, SVG PDA, OM3   DIALYSIS FISTULA CREATION     TOE AMPUTATION Bilateral    History reviewed. No pertinent family history. Social History:  reports that he has never smoked. He has never used  smokeless tobacco. He reports previous alcohol use. He reports previous drug use. Allergies  Allergen Reactions   Morphine Other (See Comments)    Per son, [name], pt becomes unresponsive/disoriented. Especially when given after HD tx   Liraglutide Diarrhea    Phenol Phenol    Prior to Admission medications   Medication Sig Start Date End Date Taking? Authorizing Provider  Amino Acids-Protein Hydrolys (FEEDING SUPPLEMENT, PRO-STAT SUGAR FREE 64,) LIQD Take 30 mLs by mouth in the morning and at bedtime. (0900 & 2100)   Yes [provider]  amiodarone (PACERONE) 400 MG tablet Take 400 mg by mouth in the morning and at bedtime. (0900 & 1700)   Yes [provider]  apixaban (ELIQUIS) 5 MG TABS tablet Take 1 tablet (5 mg total) by mouth 2 (two) times daily. Patient taking differently: Take 5 mg by mouth 2 (two) times daily. (0900 & 1700) 12/20/20 01/19/21 Yes Pahwani, Einar Grad, MD  atorvastatin (LIPITOR) 40 MG tablet Take 40 mg by mouth every evening. (2100) 09/23/20  Yes [provider]  B Complex-C-Folic Acid (NEPHRO-VITE PO) Take 1 tablet by mouth at bedtime.   Yes [provider]  calcium acetate (PHOSLO) 667 MG capsule Take 1,334 mg by mouth 3 (three) times daily with meals.   Yes [provider]  cholestyramine light (PREVALITE) 4 g packet Take 1 packet (4 g total) by mouth every 12 (twelve) hours. Patient taking differently: Take 4 g by mouth every 12 (twelve) hours. (0900 & 2100) 12/20/20 01/19/21 Yes Pahwani,  Einar Grad, MD  clonazePAM (KLONOPIN) 0.5 MG tablet Take 0.25 mg by mouth 2 (two) times daily. (0730 & 1600)   Yes [provider]  clonazePAM (KLONOPIN) 0.5 MG tablet Take 0.25 mg by mouth 2 (two) times daily as needed for anxiety.   Yes [provider]  clopidogrel (PLAVIX) 75 MG tablet Take 75 mg by mouth in the morning. 10/13/20  Yes [provider]  digoxin 62.5 MCG TABS Take 0.0625 mg by mouth every other day. 01/01/21  Yes  Kathie Dike, MD  docusate sodium (COLACE) 100 MG capsule Take 1 capsule (100 mg total) by mouth 2 (two) times daily as needed for mild constipation. Patient taking differently: Take 100 mg by mouth 2 (two) times daily. 12/31/20  Yes Kathie Dike, MD  fentaNYL (DURAGESIC) 25 MCG/HR Place 1 patch onto the skin every 3 (three) days. 01/01/21  Yes Kathie Dike, MD  ferric citrate (AURYXIA) 1 GM 210 MG(Fe) tablet Take 210 mg by mouth 3 (three) times daily with meals. (0800, 1200 & 1700)   Yes [provider]  HYDROmorphone (DILAUDID) 2 MG tablet Take 2 mg by mouth every 6 (six) hours as needed for severe pain.   Yes [provider]  Insulin Glargine (BASAGLAR KWIKPEN) 100 UNIT/ML Inject 15 Units into the skin at bedtime. 12/20/20  Yes Pahwani, Einar Grad, MD  insulin lispro (HUMALOG) 100 UNIT/ML KwikPen Inject 2-12 Units into the skin See admin instructions. 180-200 = 2 units,  201-250 = 5 units  251-300 = 7 units  301-400 = 12 units 08/23/20  Yes [provider]  latanoprost (XALATAN) 0.005 % ophthalmic solution Place 1 drop into both eyes at bedtime.   Yes [provider]  levothyroxine (SYNTHROID) 25 MCG tablet Take 25 mcg by mouth daily before breakfast.   Yes [provider]  lidocaine (XYLOCAINE) 5 % ointment Apply topically 3 (three) times daily. Patient taking differently: Apply 1 application topically 3 (three) times daily. Applied to L chest topical every shift 12/30/20  Yes Memon, Jolaine Artist, MD  metoprolol succinate (TOPROL-XL) 25 MG 24 hr tablet Take 0.5 tablets (12.5 mg total) by mouth daily. Patient taking differently: Take 25 mg by mouth daily. 12/31/20  Yes Kathie Dike, MD  midodrine (PROAMATINE) 10 MG tablet Take 1 tablet (10 mg total) by mouth 3 (three) times daily with meals. Patient taking differently: Take 10 mg by mouth 3 (three) times daily with meals. (0900, 1400 & 2100) 12/20/20 01/19/21 Yes Pahwani, Einar Grad, MD  Multiple Vitamin  (MULTIVITAMIN) tablet Take 1 tablet by mouth daily.   Yes [provider]  mupirocin ointment (BACTROBAN) 2 % Apply 1 application topically in the morning and at bedtime. 11/16/20  Yes [provider]  nitroGLYCERIN (NITROSTAT) 0.4 MG SL tablet Place 0.4 mg under the tongue every 5 (five) minutes x 3 doses as needed for chest pain.   Yes [provider]  Oxycodone HCl 10 MG TABS Take 10 mg by mouth 2 (two) times daily as needed (severe pain).   Yes [provider]  OXYGEN Inhale 2 L into the lungs in the morning and at bedtime.   Yes [provider]  pantoprazole (PROTONIX) 40 MG tablet Take 40 mg by mouth daily at 6 (six) AM. (0600) 10/13/20  Yes [provider]  polyethylene glycol (MIRALAX / GLYCOLAX) 17 g packet Take 17 g by mouth daily as needed for moderate constipation. Patient taking differently: Take 17 g by mouth daily. 12/31/20  Yes Kathie Dike, MD  pregabalin (LYRICA) 25 MG capsule Take 25 mg by mouth 2 (two) times daily.   Yes [provider]  sevelamer carbonate (RENVELA) 800 MG tablet Take 2 tablets (1,600 mg total) by mouth 3 (three) times daily with meals. Patient taking differently: Take 1,600 mg by mouth 3 (three) times daily with meals. (0800, 1200 & 1700) 12/30/20  Yes Kathie Dike, MD  amiodarone (PACERONE) 200 MG tablet Take 1 tablet (200 mg total) by mouth 2 (two) times daily. Patient not taking: Reported on 01/18/2021 01/06/21   Sherran Needs, NP  multivitamin (RENA-VIT) TABS tablet Take 1 tablet by mouth at bedtime. Patient not taking: Reported on 01/18/2021 12/30/20   Kathie Dike, MD  oxyCODONE (OXY IR/ROXICODONE) 5 MG immediate release tablet Take 1 tablet (5 mg total) by mouth every 12 (twelve) hours as needed for severe pain. Patient not taking: No sig reported 12/20/20   Darliss Cheney, MD   Current Facility-Administered Medications  Medication Dose Route Frequency Provider Last Rate Last Admin   0.9  %  sodium chloride infusion  100 mL Intravenous PRN Kobi Mario, Ria Comment, PA       0.9 %  sodium chloride infusion  100 mL Intravenous PRN Alivya Wegman, Ria Comment, PA       acetaminophen (TYLENOL) tablet 650 mg  650 mg Oral Q6H PRN Howerter, Justin B, DO   650 mg at 01/18/21 [id number]   Or   acetaminophen (TYLENOL) suppository 650 mg  650 mg Rectal Q6H PRN Howerter, Justin B, DO       alteplase (CATHFLO ACTIVASE) injection 2 mg  2 mg Intracatheter Once PRN Maudy Yonan, Ria Comment, PA       amiodarone (PACERONE) tablet 400 mg  400 mg Oral BID Howerter, Justin B, DO   400 mg at 01/18/21 0933   apixaban (ELIQUIS) tablet 5 mg  5 mg Oral BID Howerter, Justin B, DO   5 mg at 01/18/21 0933   atorvastatin (LIPITOR) tablet 40 mg  40 mg Oral QPM Howerter, Justin B, DO       calcium acetate (PHOSLO) capsule 1,334 mg  1,334 mg Oral TID WC Howerter, Justin B, DO       cefTRIAXone (ROCEPHIN) 1 g in sodium chloride 0.9 % 100 mL IVPB  1 g Intravenous Q24H Howerter, Justin B, DO   Stopped at 01/18/21 0533   Chlorhexidine Gluconate Cloth 2 % PADS 6 each  6 each Topical Q0600 Dreyson Mishkin, Ria Comment, PA       clopidogrel (PLAVIX) tablet 75 mg  75 mg Oral q AM Howerter, Justin B, DO   75 mg at 01/18/21 0935   heparin injection 1,000 Units  1,000 Units Dialysis PRN Endy Easterly, Ria Comment, PA       insulin aspart (novoLOG) injection 0-6 Units  0-6 Units Subcutaneous Q6H Howerter, Justin B, DO       levothyroxine (SYNTHROID) tablet 25 mcg  25 mcg Oral Q0600 Howerter, Justin B, DO   25 mcg at 01/18/21 0550   lidocaine (PF) (XYLOCAINE) 1 % injection 5 mL  5 mL Intradermal PRN Allex Madia, Ria Comment, PA       lidocaine-prilocaine (EMLA) cream 1 application  1 application Topical PRN Aliyha Fornes, Ria Comment, PA       metoprolol succinate (TOPROL-XL) 24 hr tablet 12.5 mg  12.5 mg Oral Daily Howerter, Justin B, DO   12.5 mg at 01/18/21 0932   midodrine (PROAMATINE) 5 MG tablet            midodrine (PROAMATINE) tablet 10 mg  10  mg Oral TID WC Howerter, Justin  B, DO   10 mg at 01/18/21 1255   pentafluoroprop-tetrafluoroeth (GEBAUERS) aerosol 1 application  1 application Topical PRN Zebediah Beezley, [place], [place]       [START ON 01/19/2021] sodium thiosulfate 25 g in sodium chloride 0.9 % 200 mL Infusion for Calciphylaxis  25 g Intravenous Q M,W,F-HD Wilbon Obenchain, Ria Comment, PA       vancomycin variable dose per unstable renal function (pharmacist dosing)   Does not apply See admin instructions Rhetta Mura, DO       Labs: Basic Metabolic Panel: Recent Labs  Lab 01/17/21 2048 01/17/21 2201 01/18/21 0109 01/18/21 0342 01/18/21 0344  NA 138   < > 138 138 137  K 4.3   < > 4.3 4.4 4.2  CL 100  --   --  102  --   CO2 20*  --   --  17*  --   GLUCOSE 202*  --   --  146*  --   BUN 54*  --   --  56*  --   CREATININE 7.32*  --   --  7.26*  --   CALCIUM 8.8*  --   --  8.8*  --   PHOS  --   --   --  5.3*  --    < > = values in this interval not displayed.   Liver Function Tests: Recent Labs  Lab 01/17/21 2048 01/18/21 0342  AST 30 27  ALT 8 8  ALKPHOS 88 88  BILITOT 1.2 1.2  PROT 6.6 5.8*  ALBUMIN 2.2* 2.1*    Recent Labs  Lab 01/18/21 0200  AMMONIA 61*   CBC: Recent Labs  Lab 01/17/21 2048 01/17/21 2201 01/18/21 0109 01/18/21 0342 01/18/21 0344  WBC 20.7*  --   --  19.7*  --   NEUTROABS 17.2*  --   --  16.5*  --   HGB 8.8*   < > 20.7* 8.6* 9.5*  HCT 29.0*   < > 61.0* 27.8* 28.0*  MCV 92.4  --   --  93.3  --   PLT 261  --   --  228  --    < > = values in this interval not displayed.    CBG: Recent Labs  Lab 01/18/21 0553  GLUCAP 120*   Studies/Results: DG Chest 2 View  Result Date: 01/17/2021 CLINICAL DATA:  Right leg pain and edema, history of missed dialysis session EXAM: CHEST - 2 VIEW COMPARISON:  12/30/2020 FINDINGS: Cardiac shadow is enlarged. Postsurgical changes are again seen. Defibrillator is noted in place. Left jugular dialysis catheter is again noted and stable. The overall inspiratory effort is poor with  crowding of the vascular markings. Some underlying vascular congestion is seen consistent with the known history of missed dialysis session. No focal infiltrate is noted. No bony abnormality is seen. IMPRESSION: Central vascular congestion related to volume overload. No other focal abnormality is noted. Electronically Signed   By: Inez Catalina M.D.   On: 01/17/2021 21:40    ROS: All others negative except those listed in HPI.  Physical Exam: Vitals:   01/18/21 1200 01/18/21 1202 01/18/21 1230 01/18/21 1300  BP: 96/63 (!) 92/54 (!) 93/51 (!) 90/51  Pulse: (!) 110 69 80 76  Resp: '17 15 15 15  '$ Temp: 97.6 F (36.4 C)     TempSrc: Oral     SpO2: 100%     Weight:      Height:  General: chronically ill appearing, obese male in NAD Head: NCAT sclera not icteric MMM Neck: Supple.  Lungs: CTA bilaterally. No wheeze, rales or rhonchi. Breathing is unlabored on 2L via [place]. Heart: regular rate, irregular rhythm. No murmur, rubs or gallops apprecaited.  Abdomen: soft, nontender, +BS, no guarding, no rebound tenderness Lower extremities: 1+ edema in b/l LE, multiple areas of erythema b/l, R thigh skin taught, large area of erythema on on lateral thigh, painful to the touch Neuro: sleepy but easily awoken with verbal stimuli.  Moves all extremities spontaneously. Psych:  Responds to questions appropriately with a normal affect. Dialysis Access: [facility]  Dialysis Orders:  Last orders according to last note 6/23:  MWF at [facility] 4hr, 450/800, EDW 117kg, 2K/3Ca, TDC, heparin 4900 bolus + 500/hr  Will request OP orders from center  Assessment/Plan:  R thigh pain/calciphylaxis/edema/?cellulitis - WBC 19.7. Antibiotics started.  On Na thiosulfate with HD.  Acute encephalopathy - improving with BiPAP. Multifactorial etiology.  Acute on chronic hypercapnic respiratory failure - labs improving with BiPAP. CXR with volume overload.  Plan for UF 3-3.5L as tolerated.   ESRD -  on HD MWF. Orders  written for HD off schedule yesterday due to missed dialysis and volume overload.  HD again tomorrow per regular schedule.  Volume overload - Increased LE edema, CXR with vascular congestion.  Plan for HD today and tomorrow.    Hypertension - chronic hypotension on midodrine.    Anemia of CKD - Hgb 9.5.  Will get OP records for ESA dosing.   Secondary Hyperparathyroidism -  Ca and phos in goal.  Will get OP records for VDRA/binders.  Nutrition - Renal diet w/fluid restrictions.  Diastolic HF - volume control with HD 11. DMT2 12. PAF - per chart plan for cardioversion later this month  Jen Mow, PA-C [facility] 01/18/2021, 1:09 PM

## 2021-01-19 ENCOUNTER — Inpatient Hospital Stay (HOSPITAL_COMMUNITY): Payer: Medicare HMO

## 2021-01-19 DIAGNOSIS — Z86718 Personal history of other venous thrombosis and embolism: Secondary | ICD-10-CM

## 2021-01-19 DIAGNOSIS — L03031 Cellulitis of right toe: Secondary | ICD-10-CM | POA: Diagnosis not present

## 2021-01-19 DIAGNOSIS — I5033 Acute on chronic diastolic (congestive) heart failure: Secondary | ICD-10-CM | POA: Diagnosis not present

## 2021-01-19 DIAGNOSIS — G9340 Encephalopathy, unspecified: Secondary | ICD-10-CM | POA: Diagnosis not present

## 2021-01-19 DIAGNOSIS — J9621 Acute and chronic respiratory failure with hypoxia: Secondary | ICD-10-CM | POA: Diagnosis not present

## 2021-01-19 LAB — BLOOD GAS, ARTERIAL
Acid-base deficit: 3.2 mmol/L — ABNORMAL HIGH (ref 0.0–2.0)
Bicarbonate: 22 mmol/L (ref 20.0–28.0)
FIO2: 40
O2 Saturation: 97.2 %
Patient temperature: 36.9
pCO2 arterial: 44.8 mmHg (ref 32.0–48.0)
pH, Arterial: 7.312 — ABNORMAL LOW (ref 7.350–7.450)
pO2, Arterial: 106 mmHg (ref 83.0–108.0)

## 2021-01-19 LAB — CBC WITH DIFFERENTIAL/PLATELET
Abs Immature Granulocytes: 0.08 10*3/uL — ABNORMAL HIGH (ref 0.00–0.07)
Basophils Absolute: 0 10*3/uL (ref 0.0–0.1)
Basophils Relative: 0 %
Eosinophils Absolute: 0.1 10*3/uL (ref 0.0–0.5)
Eosinophils Relative: 1 %
HCT: 26.1 % — ABNORMAL LOW (ref 39.0–52.0)
Hemoglobin: 8.2 g/dL — ABNORMAL LOW (ref 13.0–17.0)
Immature Granulocytes: 0 %
Lymphocytes Relative: 3 %
Lymphs Abs: 0.6 10*3/uL — ABNORMAL LOW (ref 0.7–4.0)
MCH: 28.2 pg (ref 26.0–34.0)
MCHC: 31.4 g/dL (ref 30.0–36.0)
MCV: 89.7 fL (ref 80.0–100.0)
Monocytes Absolute: 1.3 10*3/uL — ABNORMAL HIGH (ref 0.1–1.0)
Monocytes Relative: 7 %
Neutro Abs: 16.9 10*3/uL — ABNORMAL HIGH (ref 1.7–7.7)
Neutrophils Relative %: 89 %
Platelets: 193 10*3/uL (ref 150–400)
RBC: 2.91 MIL/uL — ABNORMAL LOW (ref 4.22–5.81)
RDW: 18.8 % — ABNORMAL HIGH (ref 11.5–15.5)
WBC: 19.1 10*3/uL — ABNORMAL HIGH (ref 4.0–10.5)
nRBC: 0 % (ref 0.0–0.2)

## 2021-01-19 LAB — COMPREHENSIVE METABOLIC PANEL
ALT: 8 U/L (ref 0–44)
AST: 28 U/L (ref 15–41)
Albumin: 2.5 g/dL — ABNORMAL LOW (ref 3.5–5.0)
Alkaline Phosphatase: 87 U/L (ref 38–126)
Anion gap: 14 (ref 5–15)
BUN: 32 mg/dL — ABNORMAL HIGH (ref 8–23)
CO2: 22 mmol/L (ref 22–32)
Calcium: 8.3 mg/dL — ABNORMAL LOW (ref 8.9–10.3)
Chloride: 101 mmol/L (ref 98–111)
Creatinine, Ser: 5.1 mg/dL — ABNORMAL HIGH (ref 0.61–1.24)
GFR, Estimated: 12 mL/min — ABNORMAL LOW (ref >60)
Glucose, Bld: 140 mg/dL — ABNORMAL HIGH (ref 70–99)
Potassium: 3.5 mmol/L (ref 3.5–5.1)
Sodium: 137 mmol/L (ref 135–145)
Total Bilirubin: 1.4 mg/dL — ABNORMAL HIGH (ref 0.3–1.2)
Total Protein: 6.1 g/dL — ABNORMAL LOW (ref 6.5–8.1)

## 2021-01-19 LAB — TROPONIN I (HIGH SENSITIVITY): Troponin I (High Sensitivity): 432 ng/L (ref <18)

## 2021-01-19 LAB — GLUCOSE, CAPILLARY
Glucose-Capillary: 134 mg/dL — ABNORMAL HIGH (ref 70–99)
Glucose-Capillary: 137 mg/dL — ABNORMAL HIGH (ref 70–99)
Glucose-Capillary: 137 mg/dL — ABNORMAL HIGH (ref 70–99)
Glucose-Capillary: 149 mg/dL — ABNORMAL HIGH (ref 70–99)
Glucose-Capillary: 164 mg/dL — ABNORMAL HIGH (ref 70–99)
Glucose-Capillary: 180 mg/dL — ABNORMAL HIGH (ref 70–99)
Glucose-Capillary: 189 mg/dL — ABNORMAL HIGH (ref 70–99)

## 2021-01-19 LAB — LACTIC ACID, PLASMA
Lactic Acid, Venous: 1.2 mmol/L (ref 0.5–1.9)
Lactic Acid, Venous: 1.1 mmol/L (ref 0.5–1.9)

## 2021-01-19 LAB — MAGNESIUM: Magnesium: 1.9 mg/dL (ref 1.7–2.4)

## 2021-01-19 LAB — PHOSPHORUS: Phosphorus: 3.8 mg/dL (ref 2.5–4.6)

## 2021-01-19 LAB — PROCALCITONIN: Procalcitonin: 1.6 ng/mL

## 2021-01-19 IMAGING — CT CT FOOT*L* W/O CM
1 series · 12 of 14 positions shown, 15 images · non-contrast
Comparison: Left foot x-rays dated [DATE].

CLINICAL DATA: Dry gangrene of the left second metatarsal.

EXAM:
CT OF THE LEFT FOOT WITHOUT CONTRAST
TECHNIQUE: Multidetector CT imaging of the left foot was performed according to
the standard protocol. Multiplanar CT image reconstructions were
also generated.

[Series 7: lt foot (id) st · axial · 0.44mm/px · z∈[+448,+635]mm · 12 of 149 slices shown, 15 images]
[im 12/149  soft-tissue]
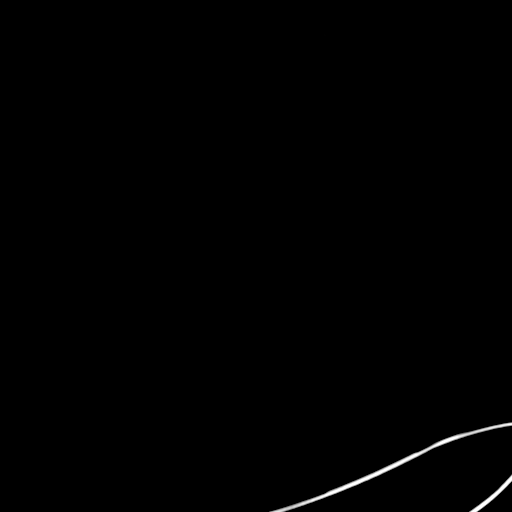
[im 12/149  bone]
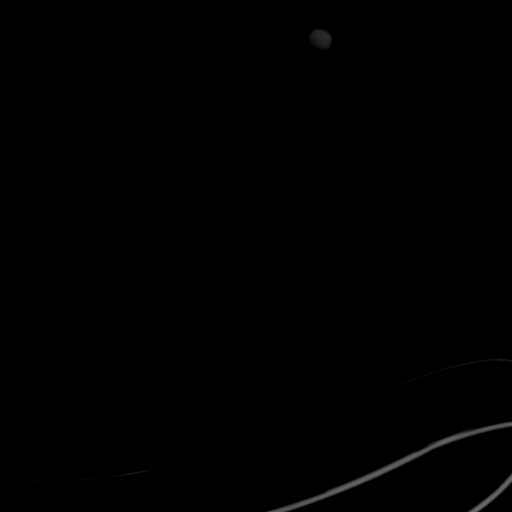
[im 23/149  bone]
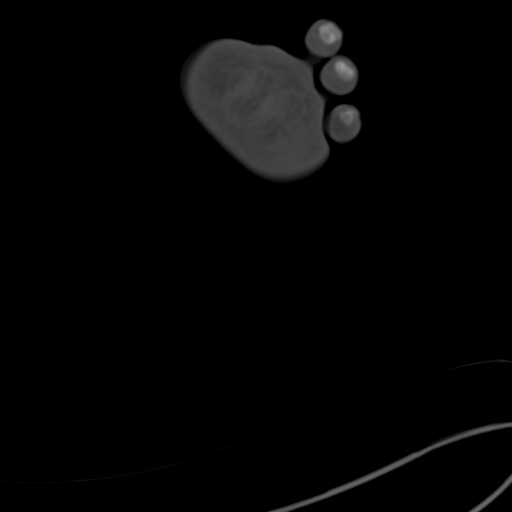
[im 35/149  bone]
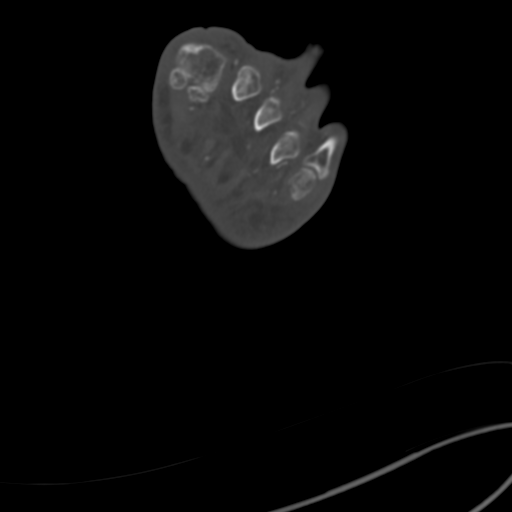
[im 46/149  bone]
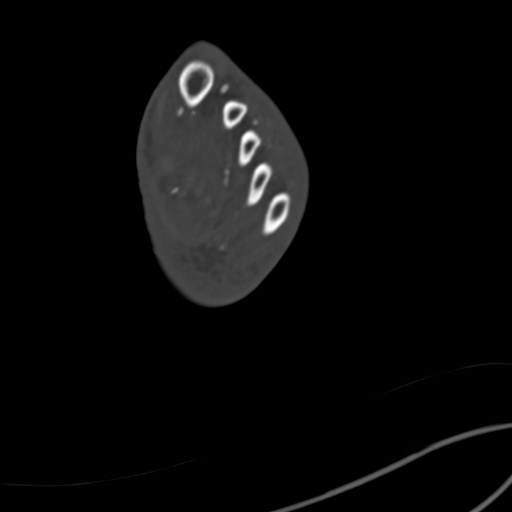
[im 57/149  soft-tissue]
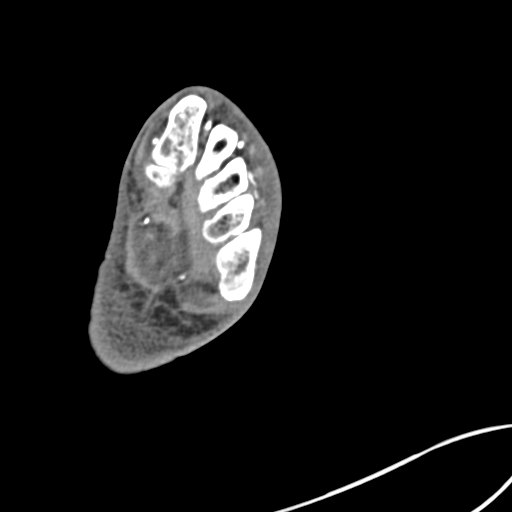
[im 57/149  bone]
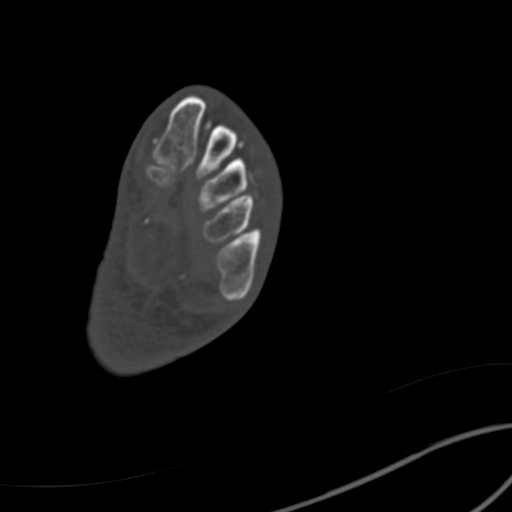
[im 69/149  bone]
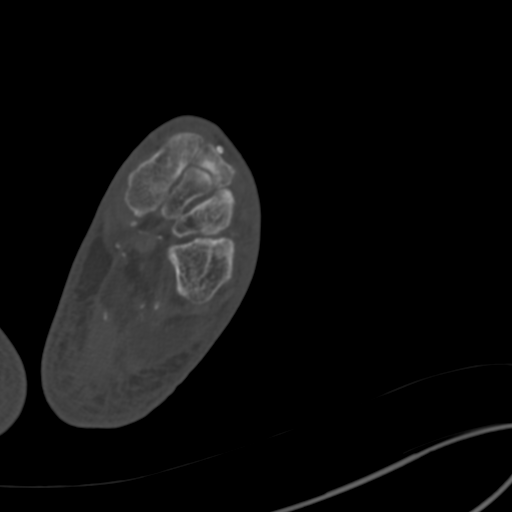
[im 80/149  bone]
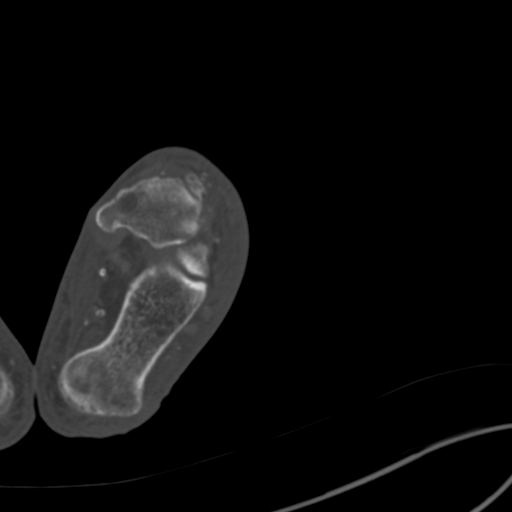
[im 92/149  bone]
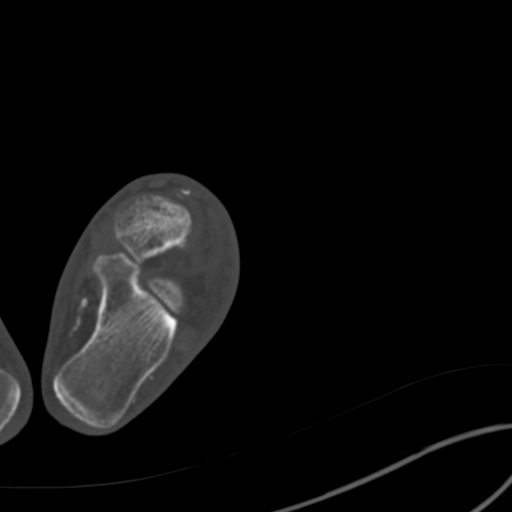
[im 103/149  soft-tissue]
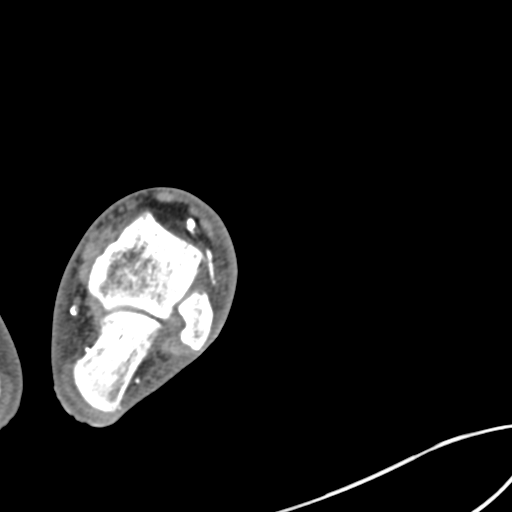
[im 103/149  bone]
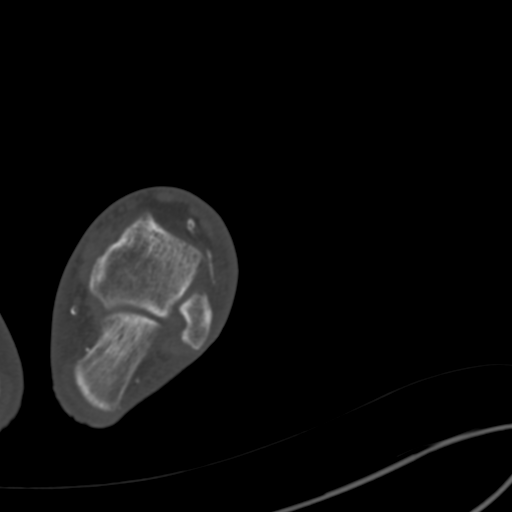
[im 114/149  bone]
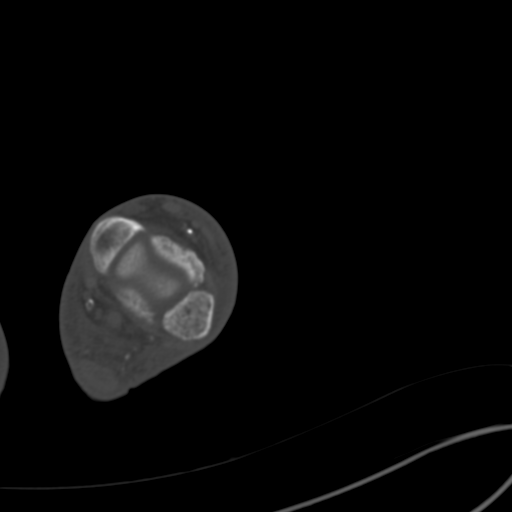
[im 126/149  bone]
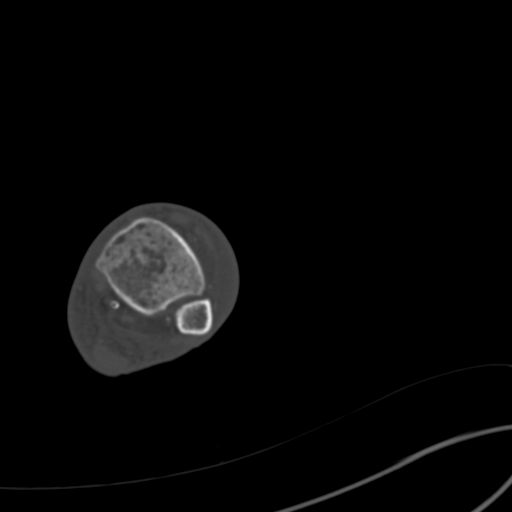
[im 137/149  bone]
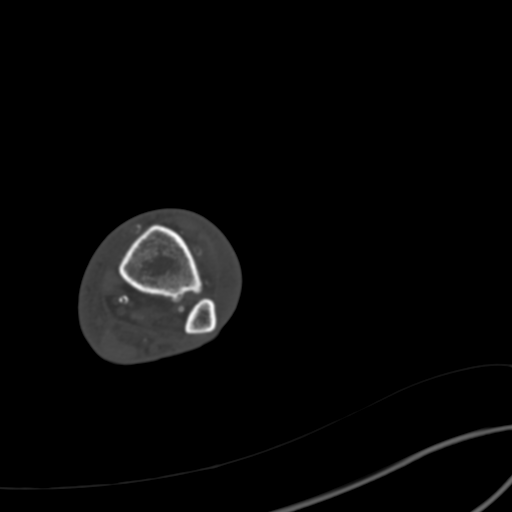

[12 of 14 positions shown; findings below may reference images not displayed]

FINDINGS: Bones/Joint/Cartilage

Prior first and second toe amputations. No bony destruction or
periosteal reaction. No fracture or dislocation. Mild midfoot
osteoarthritis. No joint effusion.

Ligaments

Ligaments are suboptimally evaluated by CT.

Muscles and Tendons
Grossly intact.

Soft tissue
Mild diffuse soft tissue swelling. No subcutaneous emphysema. No
fluid collection or hematoma. No soft tissue mass.
IMPRESSION: 1. Prior first and second toe amputations. No acute osseous
abnormality.
2. Mild diffuse soft tissue swelling. No abscess.

## 2021-01-19 IMAGING — CT CT FOOT*R* W/O CM
2 of 5 series · 5 of 14 positions shown, 6 images · non-contrast
Comparison: Right foot x-rays dated [DATE].

CLINICAL DATA: Right lower leg cellulitis.

EXAM:
CT OF THE RIGHT FOOT WITHOUT CONTRAST
TECHNIQUE: Multidetector CT imaging of the right foot was performed according
to the standard protocol. Multiplanar CT image reconstructions were
also generated.

[Series 14: rt foot thin bone · axial · 0.44mm/px · z∈[+516,+586]mm · 2 of 303 slices shown]
[im 101/303  bone]
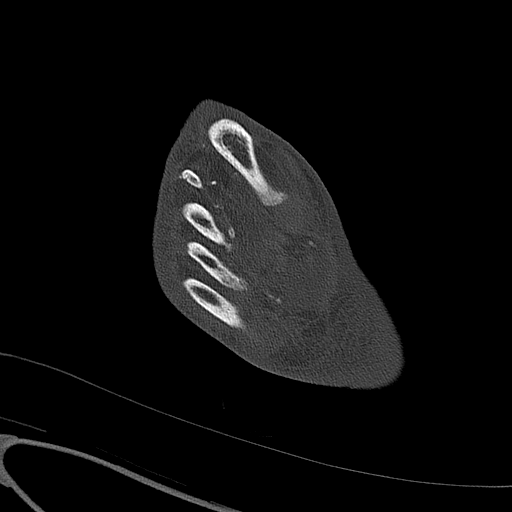
[im 202/303  bone]
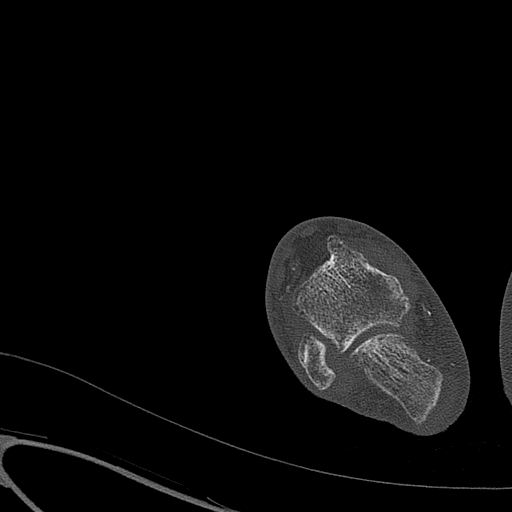

[Series 15: rt foot thin st · axial · 0.51mm/px · z∈[+488,+626]mm · 3 of 395 slices shown, 4 images]
[im 99/395  soft-tissue]
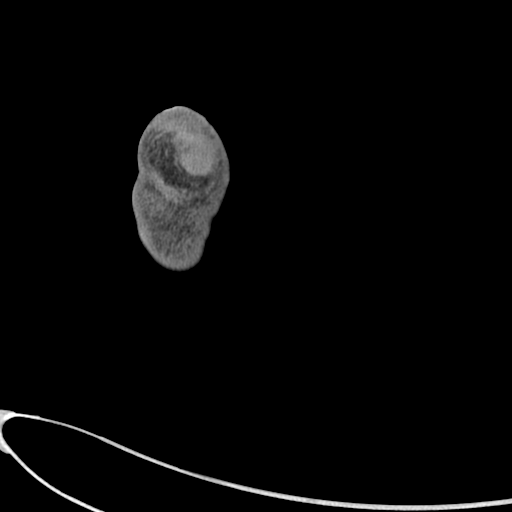
[im 99/395  bone]
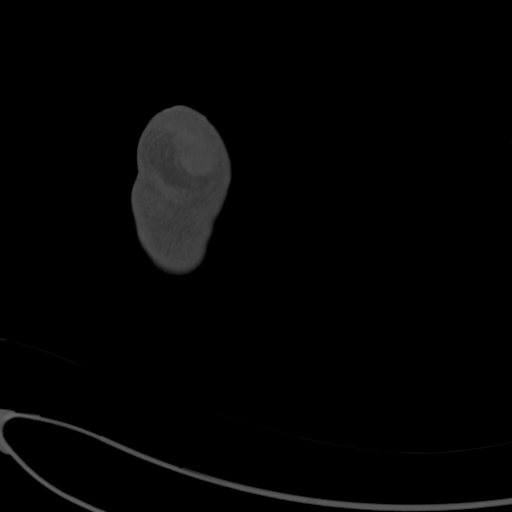
[im 198/395  bone]
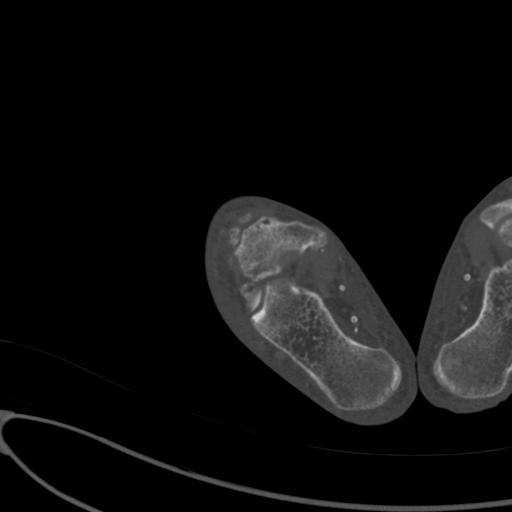
[im 296/395  bone]
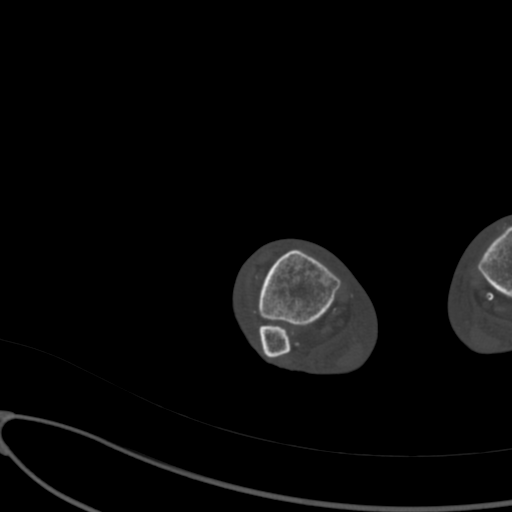

[5 of 14 positions shown; findings below may reference images not displayed]

FINDINGS: Bones/Joint/Cartilage

Prior transmetatarsal amputation. No bony destruction or periosteal
reaction. Chronic deformity of the residual second metatarsal. No
fracture or dislocation. Mild mid and hindfoot degenerative changes.
No joint effusion.

Ligaments

Ligaments are suboptimally evaluated by CT.

Muscles and Tendons
Grossly intact.

Soft tissue
Mild diffuse soft tissue swelling. No subcutaneous emphysema. No
fluid collection or hematoma. No soft tissue mass. Probable small
pressure lesion at the base the first metatarsal.
IMPRESSION: 1. Prior transmetatarsal amputation with chronic deformity of the
residual second metatarsal. No acute osseous abnormality.
2. Mild diffuse soft tissue swelling. No abscess.

## 2021-01-19 IMAGING — CT CT TIBIA FIBULA *R* W/O CM
3 series · 13 of 20 positions shown, 15 images · non-contrast
Comparison: Right tibia and fibula x-rays dated [DATE].

CLINICAL DATA: Right lower leg cellulitis.

EXAM:
CT OF THE LOWER RIGHT EXTREMITY WITHOUT CONTRAST
TECHNIQUE: Multidetector CT imaging of the right lower extremity was performed
according to the standard protocol.

[Series 4: lower ext 1.5 st · axial · 0.51mm/px · z∈[+490,+983]mm · 8 of 425 slices shown, 10 images]
[im 48/425  soft-tissue]
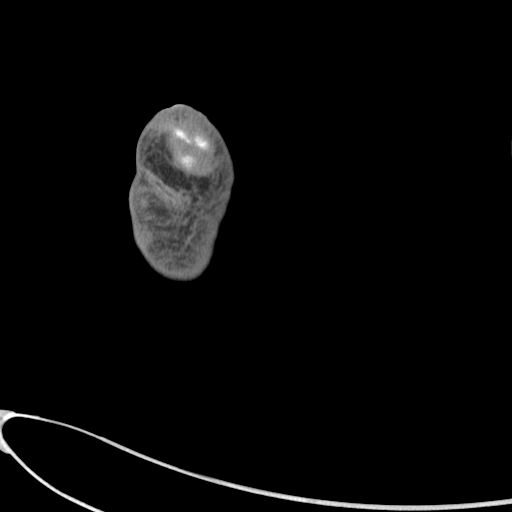
[im 48/425  bone]
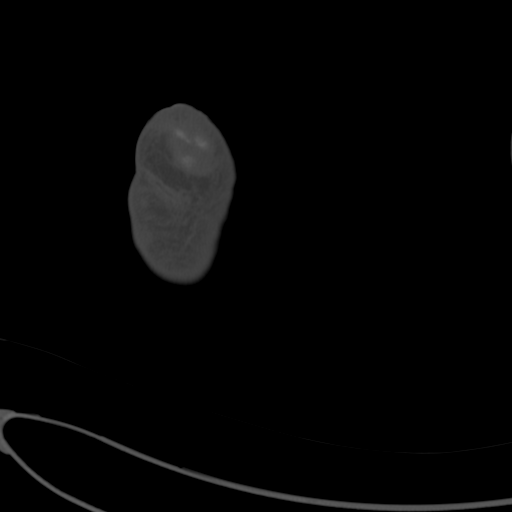
[im 95/425  bone]
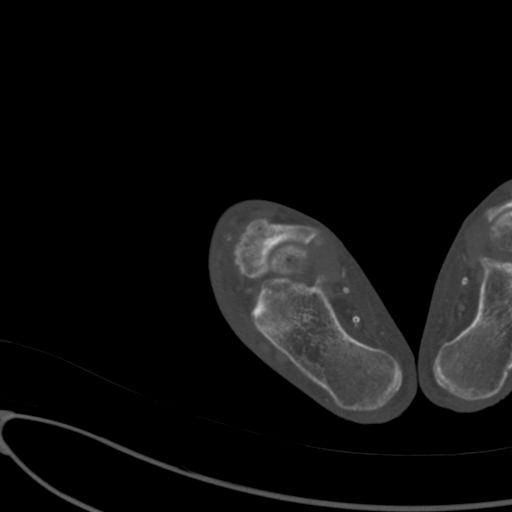
[im 142/425  bone]
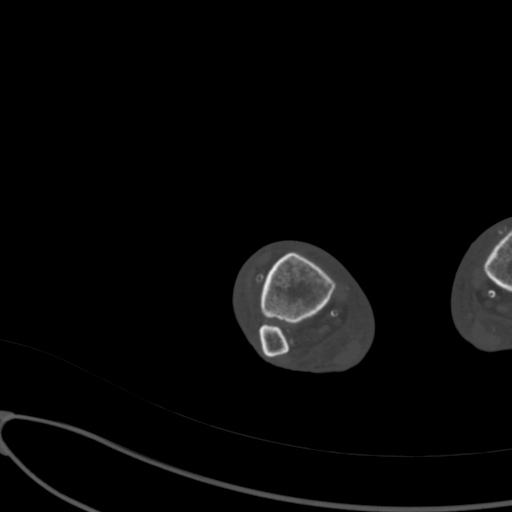
[im 189/425  bone]
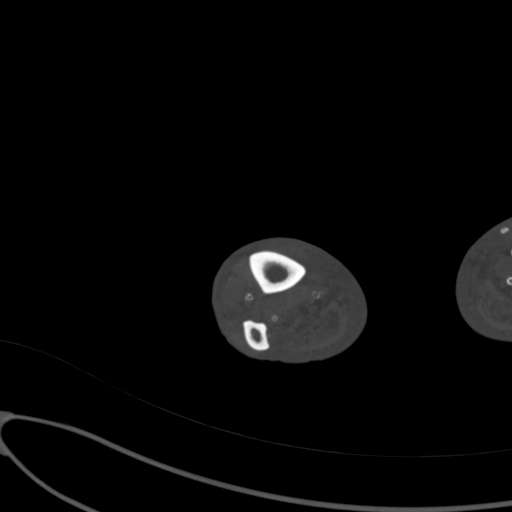
[im 236/425  soft-tissue]
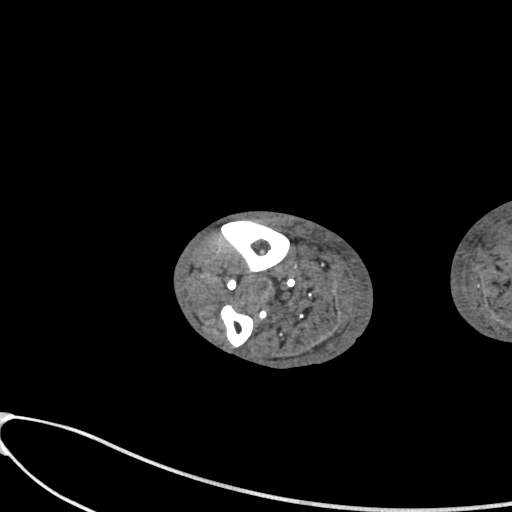
[im 236/425  bone]
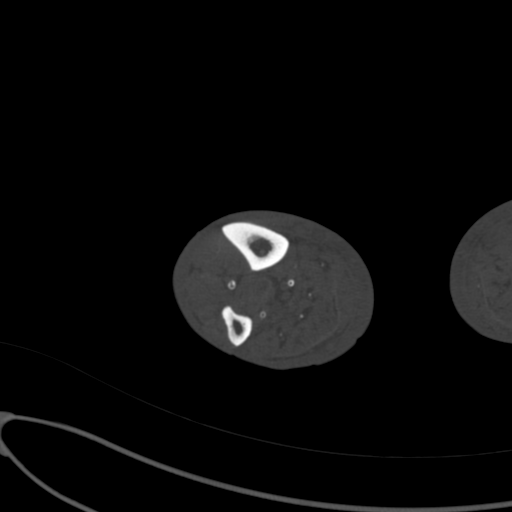
[im 283/425  bone]
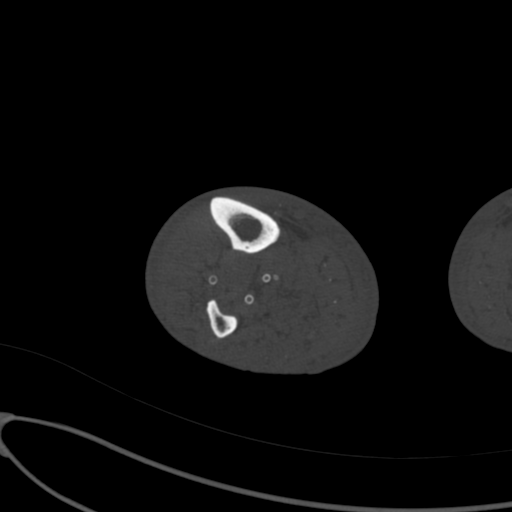
[im 330/425  bone]
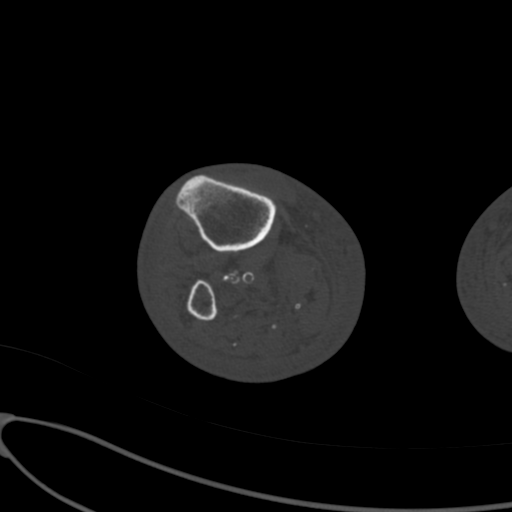
[im 377/425  bone]
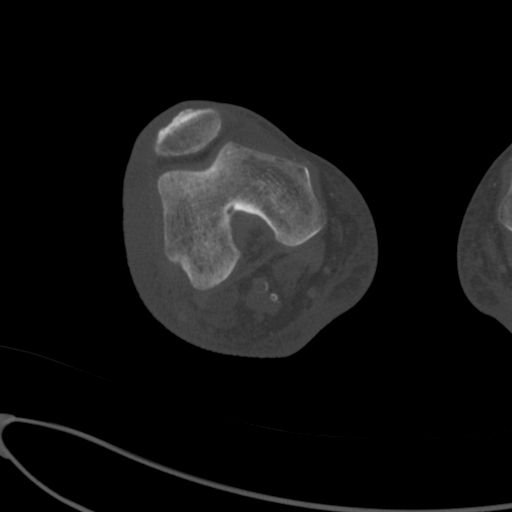

[Series 10: lower ext sag st · coronal · 0.49mm/px · 3 of 166 slices shown]
[im 34/166  bone]
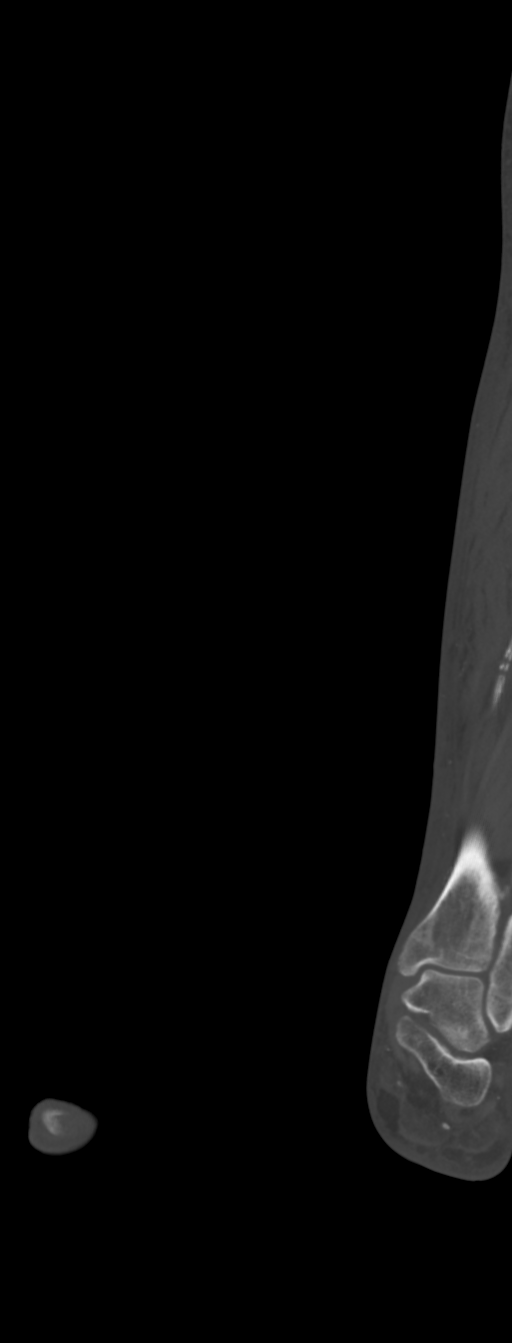
[im 67/166  bone]
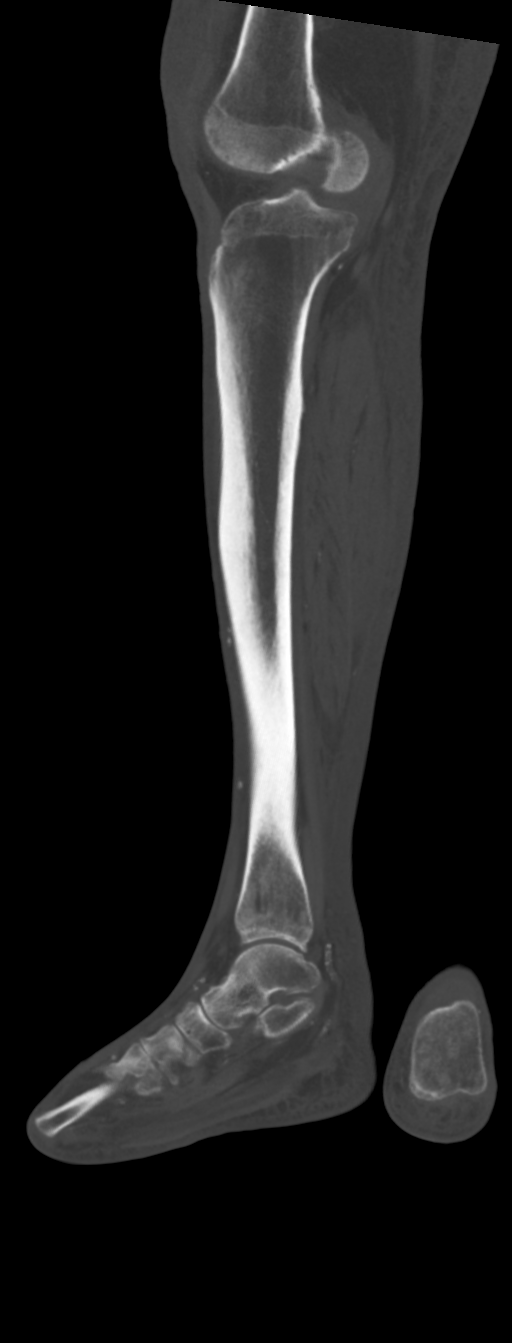
[im 100/166  bone]
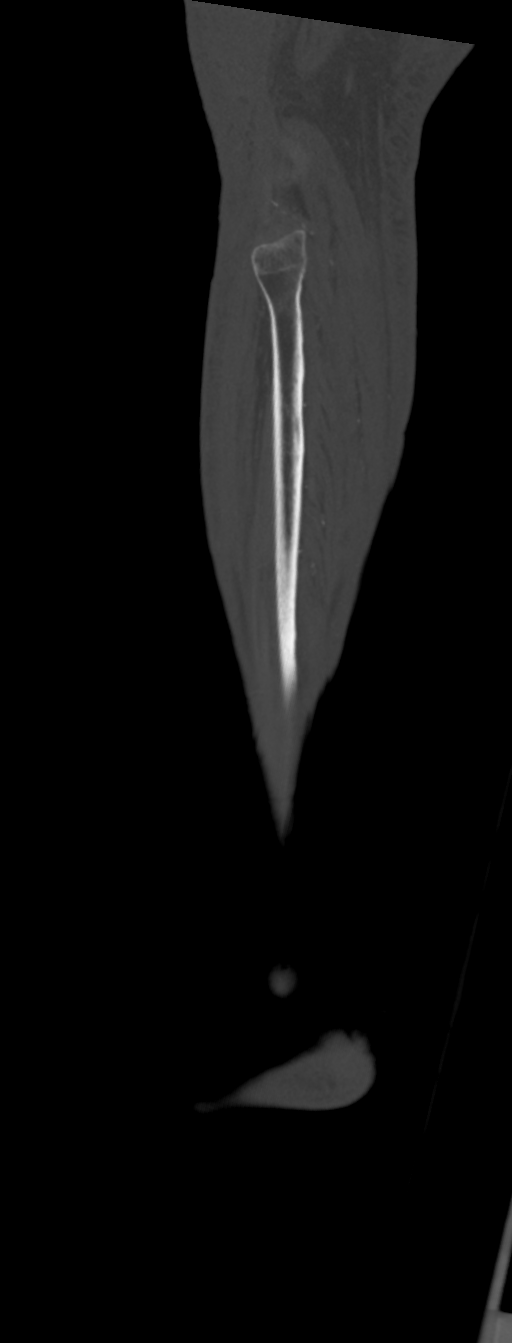

[Series 13: lower ext (id) st · axial · 0.51mm/px · z∈[+510,+602]mm · 2 of 185 slices shown]
[im 62/185  bone]
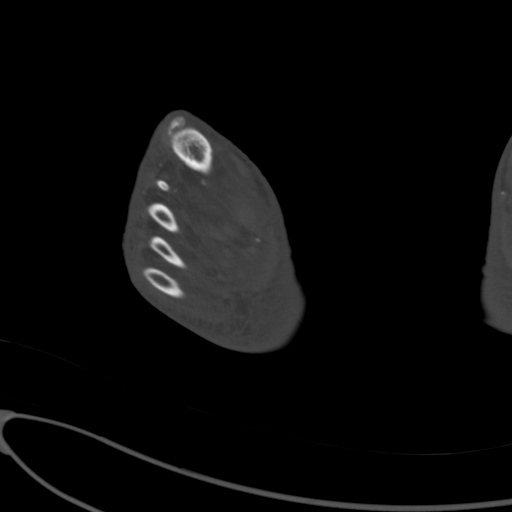
[im 123/185  bone]
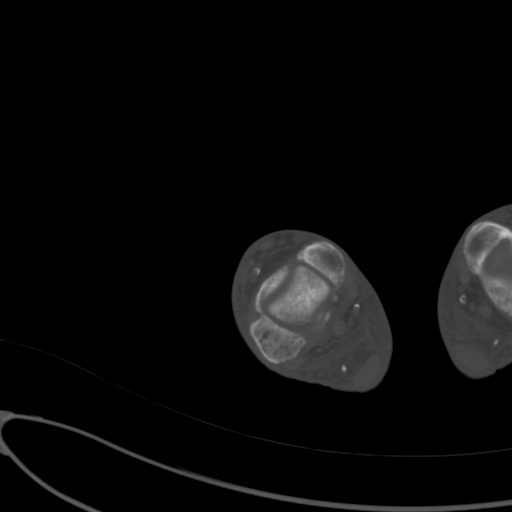

[13 of 20 positions shown; findings below may reference images not displayed]

FINDINGS: Bones/Joint/Cartilage

No bony destruction or periosteal reaction. No fracture or
dislocation. Mild knee medial compartment joint space in. Small knee
joint effusion.

Ligaments

Ligaments are suboptimally evaluated by CT.

Muscles and Tendons
Grossly intact.

Soft tissue
Severe circumferential soft tissue swelling of the idstal thigh and
lower leg. Incompletely visualized superficial fluid collection
overlying the distal vastus lateralis muscle measuring 4.4 x 2.9 cm
(series 4, image 1). No subcutaneous emphysema. Extensive
atherosclerotic vascular calcification.
IMPRESSION: 1. Severe circumferential soft tissue swelling of the right distal
thigh and lower leg, consistent with history of cellulitis.
Incompletely visualized superficial fluid collection overlying the
distal vastus lateralis muscle measuring 4.4 x 2.9 cm, concerning
for abscess.
2. No acute osseous abnormality.

## 2021-01-19 MED ORDER — HYDROMORPHONE HCL 1 MG/ML IJ SOLN
1.0000 mg | INTRAMUSCULAR | Status: DC | PRN
Start: 2021-01-19 — End: 2021-01-19
  Administered 2021-01-19: 1 mg via INTRAVENOUS
  Filled 2021-01-19: qty 1

## 2021-01-19 MED ORDER — HYDROMORPHONE HCL 1 MG/ML IJ SOLN
INTRAMUSCULAR | Status: AC
  Administered 2021-01-19: 0.5 mg via INTRAVENOUS
  Filled 2021-01-19: qty 0.5

## 2021-01-19 MED ORDER — VANCOMYCIN HCL IN DEXTROSE 1-5 GM/200ML-% IV SOLN
1000.0000 mg | INTRAVENOUS | Status: DC
  Administered 2021-01-20: 1000 mg via INTRAVENOUS
  Filled 2021-01-19: qty 200

## 2021-01-19 MED ORDER — VANCOMYCIN HCL IN DEXTROSE 1-5 GM/200ML-% IV SOLN
INTRAVENOUS | Status: AC
  Filled 2021-01-19: qty 200

## 2021-01-19 MED ORDER — GERHARDT'S BUTT CREAM
TOPICAL_CREAM | Freq: Two times a day (BID) | CUTANEOUS | Status: DC
  Administered 2021-01-20: 1 via TOPICAL
  Filled 2021-01-19 (×3): qty 1

## 2021-01-19 MED ORDER — CHLORHEXIDINE GLUCONATE CLOTH 2 % EX PADS: 6.0000 | MEDICATED_PAD | Freq: Every day | CUTANEOUS | Status: DC

## 2021-01-19 MED ORDER — HYDROMORPHONE HCL 1 MG/ML IJ SOLN
0.5000 mg | Freq: Four times a day (QID) | INTRAMUSCULAR | Status: DC | PRN
  Administered 2021-01-20 – 2021-01-29 (×23): 0.5 mg via INTRAVENOUS
  Filled 2021-01-19 (×24): qty 0.5

## 2021-01-19 MED ORDER — HEPARIN SODIUM (PORCINE) 1000 UNIT/ML IJ SOLN
INTRAMUSCULAR | Status: AC
  Filled 2021-01-19: qty 5

## 2021-01-19 MED ORDER — MIDODRINE HCL 5 MG PO TABS
10.0000 mg | ORAL_TABLET | Freq: Once | ORAL | Status: AC
  Administered 2021-01-19: 10 mg via ORAL
  Filled 2021-01-19: qty 2

## 2021-01-19 NOTE — Progress Notes (Addendum)
PROGRESS NOTE    Rayn Amacker  [id number] DOB: [birth year] DOA: 01/17/2021 PCP: Patient, No Pcp Per (Inactive)    Brief Narrative:  Mr. Chakrabarti was admitted to the hospital with the working diagnosis of right lower extremity cellulitis, complicated with severe sepsis (metabolic encephalopathy).   65 year old male past medical history for end-stage renal disease on hemodialysis, type II [place] mellitus, chronic diastolic heart failure, paroxysmal fibrillation, chronic hypoxic respiratory failure and hypothyroidism who presented with somnolence.  He is a nursing home resident, he was found to have progressive somnolence and confusion for about 1 to 2 days.  Right lower extremity edema and erythema.  On his initial physical examination blood pressure 98/57, heart rate 78, respirate 14, oxygen saturation 98%, his lungs were clear to auscultation, heart S1-S2, present, rhythmic, soft abdomen, positive lower extremity edema.  Right thigh with erythema,  Sodium 138, potassium 4.4, chloride 1 2, bicarb 17 glucose 146, BUN 56, creatinine 7.26.  High sensitive troponin 416-383, white count 19.7, hemoglobin 8.6, hematocrit 27.8, platelets 228.  Assessment & Plan:   Principal Problem:   Acute encephalopathy Active Problems:   Diabetes mellitus with peripheral vascular disease (HCC)   History of DVT (deep vein thrombosis)   Essential hypertension   OSA (obstructive sleep apnea)   Paroxysmal atrial fibrillation (HCC)   ESRD on hemodialysis (HCC)   Impaired physical mobility   Acute on chronic respiratory failure with hypoxia and hypercapnia (HCC)   Cellulitis of right leg   Acute on chronic diastolic (congestive) heart failure (HCC)   Cellulitis   High anion gap metabolic acidosis   Hypotension   Acquired hypothyroidism   End-stage renal disease on hemodialysis (HCC)   Dry gangrene (HCC)   Sepsis, unspecified organism ([place])   Right lower extremity cellulitis, complicated with sever sepsis  (end-organ damage encephalopathy) present on admission.  Patient continue to be slow to respond. Wbc continue to be elevated at 19,1. His blood pressure has been low, 90 and 99 systolic with MAP of 67.   Continue antibiotic therapy with vancomycin and cefepime. Close follow up on cell count and cultures. Will order a soft tissue CT right thigh rule out deep abscess.   2. Metabolic encephalopathy. Continue close neuro checks per unit protocol. Continue pain control with low dose hydromorphone.   3. ESRD on HD. Continue renal replacement therapy per nephrology recommendations.  Continue with phsolo.  4. Acute on chronic diastolic heart failure/ hypotension. Continue blood pressure monitoring.  Continue with midodrine 10 mg po tid   5. Paroxysmal atrial fibrillation. Continue rate control, and anticoagulation with apixaban  6. Obesity class 2 . Calculcated BMI is 35,5/  7. T2DM and dyslipidemia. Continue glucose control with insulin sliding scale. Statin therapy with atorvastatin.   8. Hypothyroid. Continue with levothyroxine.   Patient continue to be at high risk for worsening sepsis   Status is: Inpatient  Remains inpatient appropriate because:IV treatments appropriate due to intensity of illness or inability to take PO  Dispo: The patient is from: Home              Anticipated d/c is to: Home              Patient currently is not medically stable to d/c.   Difficult to place patient No   DVT prophylaxis: Apixaban   Code Status:   Full  Family Communication:  No family at the bedside      Consultants:  Nephrology     Antimicrobials:  Ceftriaxone  and vancomycin     Subjective: Patient is hyporeactive, not agitated., pain to touch on left thigh, no nausea or vomiting.   Objective: Vitals:   01/19/21 1203 01/19/21 1204 01/19/21 1326 01/19/21 1400  BP: (!) 83/44 (!) 110/54 (!) 97/52 (!) 100/59  Pulse:  100 88 98  Resp: '16 20 19 20  '$ Temp: 98 F (36.7 C) 98 F  (36.7 C)    TempSrc: Oral Oral    SpO2: 98%     Weight:      Height:        Intake/Output Summary (Last 24 hours) at 01/19/2021 1616 Last data filed at 01/19/2021 0300 Gross per 24 hour  Intake 340 ml  Output --  Net 340 ml   Filed Weights   01/17/21 2012 01/19/21 0400  Weight: 115.7 kg 114.9 kg    Examination:   General: deconditioned and ill looking appearing  Neurology: Awake and alert, non focal  E ENT: no pallor, no icterus, oral mucosa moist Cardiovascular: No JVD. S1-S2 present, rhythmic, no gallops, rubs, or murmurs. Trace lower extremity edema. Pulmonary: positive breath sounds bilaterally, adequate air movement, no wheezing, rhonchi or rales. Gastrointestinal. Abdomen soft and non tender Skin. Left lateral thigh with edema and tender to palpation Musculoskeletal: bilateral toes amputations/.       Data Reviewed: I have personally reviewed following labs and imaging studies  CBC: Recent Labs  Lab 01/17/21 2048 01/17/21 2201 01/18/21 0109 01/18/21 0342 01/18/21 0344 01/19/21 0037  WBC 20.7*  --   --  19.7*  --  19.1*  NEUTROABS 17.2*  --   --  16.5*  --  16.9*  HGB 8.8* 9.2* 20.7* 8.6* 9.5* 8.2*  HCT 29.0* 27.0* 61.0* 27.8* 28.0* 26.1*  MCV 92.4  --   --  93.3  --  89.7  PLT 261  --   --  228  --  0000000   Basic Metabolic Panel: Recent Labs  Lab 01/17/21 2048 01/17/21 2201 01/18/21 0109 01/18/21 0342 01/18/21 0344 01/18/21 0907 01/19/21 0037  NA 138 137 138 138 137  --  137  K 4.3 4.1 4.3 4.4 4.2  --  3.5  CL 100  --   --  102  --   --  101  CO2 20*  --   --  17*  --   --  22  GLUCOSE 202*  --   --  146*  --   --  140*  BUN 54*  --   --  56*  --   --  32*  CREATININE 7.32*  --   --  7.26*  --   --  5.10*  CALCIUM 8.8*  --   --  8.8*  --   --  8.3*  MG  --   --   --  1.6*  --  1.6* 1.9  PHOS  --   --   --  5.3*  --   --  3.8   GFR: Estimated Creatinine Clearance: 18.6 mL/min (A) (by C-G formula based on SCr of 5.1 mg/dL (H)). Liver  Function Tests: Recent Labs  Lab 01/17/21 2048 01/18/21 0342 01/19/21 0037  AST '30 27 28  '$ ALT '8 8 8  '$ ALKPHOS 88 88 87  BILITOT 1.2 1.2 1.4*  PROT 6.6 5.8* 6.1*  ALBUMIN 2.2* 2.1* 2.5*   No results for input(s): LIPASE, AMYLASE in the last 168 hours. Recent Labs  Lab 01/18/21 0200  AMMONIA 61*   Coagulation Profile: Recent  Labs  Lab 01/18/21 0342  INR 2.6*   Cardiac Enzymes: No results for input(s): CKTOTAL, CKMB, CKMBINDEX, TROPONINI in the last 168 hours. BNP (last 3 results) No results for input(s): PROBNP in the last 8760 hours. HbA1C: No results for input(s): HGBA1C in the last 72 hours. CBG: Recent Labs  Lab 01/19/21 0012 01/19/21 0353 01/19/21 0815 01/19/21 1159 01/19/21 1547  GLUCAP 134* 137* 189* 164* 180*   Lipid Profile: No results for input(s): CHOL, HDL, LDLCALC, TRIG, CHOLHDL, LDLDIRECT in the last 72 hours. Thyroid Function Tests: Recent Labs    01/18/21 0200  TSH 9.777*   Anemia Panel: No results for input(s): VITAMINB12, FOLATE, FERRITIN, TIBC, IRON, RETICCTPCT in the last 72 hours.    Radiology Studies: I have reviewed all of the imaging during this hospital visit personally     Scheduled Meds:  apixaban  5 mg Oral BID   atorvastatin  40 mg Oral QPM   calcium acetate  1,334 mg Oral TID WC   Chlorhexidine Gluconate Cloth  6 each Topical Q0600   clopidogrel  75 mg Oral q AM   Gerhardt's butt cream   Topical BID   insulin aspart  0-6 Units Subcutaneous Q4H   levothyroxine  50 mcg Oral Q0600   metoprolol succinate  12.5 mg Oral Daily   midodrine  10 mg Oral TID WC   Continuous Infusions:  cefTRIAXone (ROCEPHIN)  IV 1 g (01/19/21 0203)   sodium thiosulfate infusion for calciphylaxis     vancomycin       LOS: 1 day        Tres Grzywacz Gerome Apley, MD

## 2021-01-19 NOTE — NC FL2 (Signed)
[place] LEVEL OF CARE SCREENING TOOL     IDENTIFICATION  Patient Name: Kyle Walton Birthdate: 01/05/1956 Sex: male Admission Date (Current Location): 01/17/2021  [facility] and [place] Number:  Herbalist and Address:  [facility], [place] [address] [place], [place] [address]      Provider Number: [id number]  Attending Physician Name and Address:  Tawni Millers,*  Relative Name and Phone Number:  Kyle Walton,  Spouse   [phone]    Current Level of Care: Hospital Recommended Level of Care: [place] Prior Approval Number:    Date Approved/Denied:   PASRR Number: [id number]  Discharge Plan: SNF    Current Diagnoses: Patient Active Problem List   Diagnosis Date Noted   Acute on chronic diastolic (congestive) heart failure ([place]) 01/18/2021   Cellulitis 01/18/2021   Acute encephalopathy 01/18/2021   High anion gap metabolic acidosis [id number]   Hypotension 01/18/2021   Acquired hypothyroidism 01/18/2021   End-stage renal disease on hemodialysis ([place]) 01/18/2021   Dry gangrene ([place]) 01/18/2021   Sepsis, unspecified organism ([place]) 01/18/2021   Pressure injury of skin 12/11/2020   Cellulitis of right leg 12/03/2020   Severe sepsis ([place]) 12/03/2020   Aggressive behavior    CAD (coronary artery disease) 10/27/2020   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   Cardiopulmonary arrest with successful resuscitation ([place]) 10/27/2020   Diabetes mellitus with peripheral vascular disease ([place]) 10/27/2020   History of DVT (deep vein thrombosis) 10/27/2020   Essential hypertension 10/27/2020   OSA (obstructive sleep apnea) 10/27/2020   Paroxysmal atrial fibrillation ([place]) 10/27/2020   Secondary hyperparathyroidism, renal ([place]) 10/27/2020   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9 kg) Height:  '5\' 11"'$  (180.3 cm)  BEHAVIORAL SYMPTOMS/MOOD NEUROLOGICAL BOWEL NUTRITION STATUS      Incontinent Diet (please see discharge summary)  AMBULATORY STATUS COMMUNICATION OF NEEDS Skin   Limited Assist Verbally Surgical wounds (wound/incision pretibial RT cellulitis)                       Personal Care Assistance Level of Assistance  Bathing, Feeding, Dressing Bathing Assistance: Limited assistance Feeding assistance: Independent Dressing Assistance: Limited assistance     Functional Limitations Info  Sight, Speech Sight Info: Adequate Hearing Info: Adequate Speech Info: Adequate    SPECIAL CARE FACTORS FREQUENCY  PT (By licensed PT), OT (By licensed OT)     PT Frequency: 5X/week OT Frequency: 5X/week            Contractures Contractures Info: Not present    Additional Factors Info  Code Status, Allergies, Isolation Precautions Code Status Info: Full Allergies Info: Morphine, Liraglutide     Isolation Precautions Info: ESBL, VRE, MRSA     Current Medications (01/19/2021):  This is the current hospital active medication list Current Facility-Administered Medications  Medication Dose Route Frequency Provider Last Rate Last Admin   acetaminophen (  TYLENOL) tablet 650 mg  650 mg Oral Q6H PRN Howerter, Justin B, DO   650 mg at 01/18/21 2141   Or   acetaminophen (TYLENOL) suppository 650 mg  650 mg Rectal Q6H PRN Howerter, Justin B, DO       apixaban (ELIQUIS) tablet 5 mg  5 mg Oral BID Howerter, Justin B, DO   5 mg at  01/19/21 0824   atorvastatin (LIPITOR) tablet 40 mg  40 mg Oral QPM Howerter, Justin B, DO   40 mg at 01/18/21 1953   calcium acetate (PHOSLO) capsule 1,334 mg  1,334 mg Oral TID WC Howerter, Justin B, DO   1,334 mg at 01/19/21 1209   cefTRIAXone (ROCEPHIN) 1 g in sodium chloride 0.9 % 100 mL IVPB  1 g Intravenous Q24H Howerter, Justin B, DO 200 mL/hr at 01/19/21 0203 1 g at 01/19/21 0203   Chlorhexidine Gluconate Cloth 2 % PADS 6 each  6 each Topical Q0600 Penninger, Ria Comment, PA   6 each at 01/19/21 [id number]   clopidogrel (PLAVIX) tablet 75 mg  75 mg Oral q AM Howerter, Justin B, DO   75 mg at 01/19/21 [id number]   Gerhardt's butt cream   Topical BID Arrien, Jimmy Picket, MD       HYDROmorphone (DILAUDID) injection 1 mg  1 mg Intravenous Q4H PRN Arrien, Jimmy Picket, MD   1 mg at 01/19/21 1039   insulin aspart (novoLOG) injection 0-6 Units  0-6 Units Subcutaneous Q4H Allie Bossier, MD   1 Units at 01/19/21 1217   levothyroxine (SYNTHROID) tablet 50 mcg  50 mcg Oral Q0600 Allie Bossier, MD   50 mcg at 01/19/21 0510   metoprolol succinate (TOPROL-XL) 24 hr tablet 12.5 mg  12.5 mg Oral Daily Howerter, Justin B, DO   12.5 mg at 01/18/21 0932   midodrine (PROAMATINE) tablet 10 mg  10 mg Oral TID WC Howerter, Justin B, DO   10 mg at 01/19/21 1209   sodium thiosulfate 25 g in sodium chloride 0.9 % 200 mL Infusion for Calciphylaxis  25 g Intravenous Q M,W,F-HD Penninger, Ria Comment, PA       vancomycin (VANCOCIN) IVPB 1000 mg/200 mL premix  1,000 mg Intravenous Q M,W,F-HD Skeet Simmer, [facility]         Discharge Medications: Please see discharge summary for a list of discharge medications.  Relevant Imaging Results:  Relevant Lab Results:   Additional Information SSN [id number]  patient has received covid vaccine and booster shot,   patient uses cpap (Adult full face mask),   HD patient @ [facility]  M,W,F around [place]

## 2021-01-19 NOTE — Progress Notes (Signed)
[facility] Progress Note   Subjective:   Patient seen and examined at bedside.  Reports severe pain in R thigh, the worse it has ever been.  States he just feels terrible today.  Denies Per [facility] has prn pain meds, nurse notified of pain.  Denies CP, SOB, n/v/d, abdominal pain, fever and chills.   Objective Vitals:   01/19/21 1203 01/19/21 1204 01/19/21 1326 01/19/21 1400  BP: (!) 83/44 (!) 110/54 (!) 97/52 (!) 100/59  Pulse:  100 88 98  Resp: '16 20 19 20  '$ Temp: 98 F (36.7 C) 98 F (36.7 C)    TempSrc: Oral Oral    SpO2: 98%     Weight:      Height:       Physical Exam General:chronically ill appearing male in NAD Heart:RRR, no mrg Lungs:CTAB anterolaterally  Abdomen:soft, NTND Extremities:+edema in b/l thighs, multiple areas of erythema b/l, R thigh exquisitely tender to light touch Dialysis Access: [facility]   Filed Weights   01/17/21 2012 01/19/21 0400  Weight: 115.7 kg 114.9 kg    Intake/Output Summary (Last 24 hours) at 01/19/2021 1451 Last data filed at 01/19/2021 0300 Gross per 24 hour  Intake 340 ml  Output 3000 ml  Net -2660 ml    Additional Objective Labs: Basic Metabolic Panel: Recent Labs  Lab 01/17/21 2048 01/17/21 2201 01/18/21 0342 01/18/21 0344 01/19/21 0037  NA 138   < > 138 137 137  K 4.3   < > 4.4 4.2 3.5  CL 100  --  102  --  101  CO2 20*  --  17*  --  22  GLUCOSE 202*  --  146*  --  140*  BUN 54*  --  56*  --  32*  CREATININE 7.32*  --  7.26*  --  5.10*  CALCIUM 8.8*  --  8.8*  --  8.3*  PHOS  --   --  5.3*  --  3.8   < > = values in this interval not displayed.   Liver Function Tests: Recent Labs  Lab 01/17/21 2048 01/18/21 0342 01/19/21 0037  AST '30 27 28  '$ ALT '8 8 8  '$ ALKPHOS 88 88 87  BILITOT 1.2 1.2 1.4*  PROT 6.6 5.8* 6.1*  ALBUMIN 2.2* 2.1* 2.5*   CBC: Recent Labs  Lab 01/17/21 2048 01/17/21 2201 01/18/21 0342 01/18/21 0344 01/19/21 0037  WBC 20.7*  --  19.7*  --  19.1*  NEUTROABS 17.2*  --  16.5*   --  16.9*  HGB 8.8*   < > 8.6* 9.5* 8.2*  HCT 29.0*   < > 27.8* 28.0* 26.1*  MCV 92.4  --  93.3  --  89.7  PLT 261  --  228  --  193   < > = values in this interval not displayed.   Blood Culture    Component Value Date/Time   SDES BLOOD SITE NOT SPECIFIED 01/17/2021 2140   SPECREQUEST  01/17/2021 2140    BOTTLES DRAWN AEROBIC AND ANAEROBIC Blood Culture adequate volume   CULT  01/17/2021 2140    NO GROWTH 2 DAYS Performed at [facility], [place] [address] [place], [place] [address]    REPTSTATUS PENDING 01/17/2021 2140    CBG: Recent Labs  Lab 01/18/21 2038 01/19/21 0012 01/19/21 0353 01/19/21 0815 01/19/21 1159  GLUCAP 150* 134* 137* 189* 164*    Lab Results  Component Value Date   INR 2.6 (H) 01/18/2021   INR  1.5 (H) 12/04/2020   INR 1.6 (H) 12/03/2020   Studies/Results: DG Chest 2 View  Result Date: 01/17/2021 CLINICAL DATA:  Right leg pain and edema, history of missed dialysis session EXAM: CHEST - 2 VIEW COMPARISON:  12/30/2020 FINDINGS: Cardiac shadow is enlarged. Postsurgical changes are again seen. Defibrillator is noted in place. Left jugular dialysis catheter is again noted and stable. The overall inspiratory effort is poor with crowding of the vascular markings. Some underlying vascular congestion is seen consistent with the known history of missed dialysis session. No focal infiltrate is noted. No bony abnormality is seen. IMPRESSION: Central vascular congestion related to volume overload. No other focal abnormality is noted. Electronically Signed   By: Inez Catalina M.D.   On: 01/17/2021 21:40   CT TIBIA FIBULA RIGHT WO CONTRAST  Result Date: 01/19/2021 CLINICAL DATA:  Right lower leg cellulitis. EXAM: CT OF THE LOWER RIGHT EXTREMITY WITHOUT CONTRAST TECHNIQUE: Multidetector CT imaging of the right lower extremity was performed according to the standard protocol. COMPARISON:  Right tibia and fibula x-rays dated Dec 03, 2020. FINDINGS:  Bones/Joint/Cartilage No bony destruction or periosteal reaction. No fracture or dislocation. Mild knee medial compartment joint space in. Small knee joint effusion. Ligaments Ligaments are suboptimally evaluated by CT. Muscles and Tendons Grossly intact. Soft tissue Severe circumferential soft tissue swelling of the idstal thigh and lower leg. Incompletely visualized superficial fluid collection overlying the distal vastus lateralis muscle measuring 4.4 x 2.9 cm (series 4, image 1). No subcutaneous emphysema. Extensive atherosclerotic vascular calcification. IMPRESSION: 1. Severe circumferential soft tissue swelling of the right distal thigh and lower leg, consistent with history of cellulitis. Incompletely visualized superficial fluid collection overlying the distal vastus lateralis muscle measuring 4.4 x 2.9 cm, concerning for abscess. 2. No acute osseous abnormality. Electronically Signed   By: Titus Dubin M.D.   On: 01/19/2021 08:12   CT FOOT LEFT WO CONTRAST  Result Date: 01/19/2021 CLINICAL DATA:  Dry gangrene of the left second metatarsal. EXAM: CT OF THE LEFT FOOT WITHOUT CONTRAST TECHNIQUE: Multidetector CT imaging of the left foot was performed according to the standard protocol. Multiplanar CT image reconstructions were also generated. COMPARISON:  Left foot x-rays dated May 28, 2020. FINDINGS: Bones/Joint/Cartilage Prior first and second toe amputations. No bony destruction or periosteal reaction. No fracture or dislocation. Mild midfoot osteoarthritis. No joint effusion. Ligaments Ligaments are suboptimally evaluated by CT. Muscles and Tendons Grossly intact. Soft tissue Mild diffuse soft tissue swelling. No subcutaneous emphysema. No fluid collection or hematoma. No soft tissue mass. IMPRESSION: 1. Prior first and second toe amputations. No acute osseous abnormality. 2. Mild diffuse soft tissue swelling. No abscess. Electronically Signed   By: Titus Dubin M.D.   On: 01/19/2021 08:31    CT FOOT RIGHT WO CONTRAST  Result Date: 01/19/2021 CLINICAL DATA:  Right lower leg cellulitis. EXAM: CT OF THE RIGHT FOOT WITHOUT CONTRAST TECHNIQUE: Multidetector CT imaging of the right foot was performed according to the standard protocol. Multiplanar CT image reconstructions were also generated. COMPARISON:  Right foot x-rays dated January 17, 2020. FINDINGS: Bones/Joint/Cartilage Prior transmetatarsal amputation. No bony destruction or periosteal reaction. Chronic deformity of the residual second metatarsal. No fracture or dislocation. Mild mid and hindfoot degenerative changes. No joint effusion. Ligaments Ligaments are suboptimally evaluated by CT. Muscles and Tendons Grossly intact. Soft tissue Mild diffuse soft tissue swelling. No subcutaneous emphysema. No fluid collection or hematoma. No soft tissue mass. Probable small pressure lesion at the base the first metatarsal. IMPRESSION:  1. 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 No acute osseous abnormality. 2. Mild diffuse soft tissue swelling. No abscess. Electronically Signed   By: Titus Dubin M.D.   On: 01/19/2021 08:21    Medications:  cefTRIAXone (ROCEPHIN)  IV 1 g (01/19/21 0203)   sodium thiosulfate infusion for calciphylaxis     vancomycin      apixaban  5 mg Oral BID   atorvastatin  40 mg Oral QPM   calcium acetate  1,334 mg Oral TID WC   Chlorhexidine Gluconate Cloth  6 each Topical Q0600   clopidogrel  75 mg Oral q AM   Gerhardt's butt cream   Topical BID   insulin aspart  0-6 Units Subcutaneous Q4H   levothyroxine  50 mcg Oral Q0600   metoprolol succinate  12.5 mg Oral Daily   midodrine  10 mg Oral TID WC    Dialysis Orders: Last orders according to last note 6/23:  MWF at [facility] 4hr, 450/800, EDW 117kg, 2K/3Ca, TDC, heparin 4900 bolus + 500/hr   Will request OP orders from center   Assessment/Plan:  R thigh pain/calciphylaxis/edema/?cellulitis - WBC 19.7.  Antibiotics started.  On Na thiosulfate with HD.  CT of tib/fib on right concerning for abscess.  Per PMD Acute encephalopathy - Improved. Multifactorial etiology. Acute on chronic hypercapnic respiratory failure - Improved. CXR with volume overload.  UF yesterday 3L, plan for additional 3L today as tolerated.   ESRD -  on HD MWF. Orders written for HD off schedule yesterday due to missed dialysis and volume overload.  HD again today per regular schedule. Volume overload - Increased LE edema, CXR with vascular congestion.  net UF 3L yesterday.  Plan for additional 3L today.   Hypertension - chronic hypotension on midodrine.    Anemia of CKD - Hgb 8.2.  Will get OP records for ESA dosing.  Secondary Hyperparathyroidism -  Ca and phos in goal.  Will get OP records for VDRA/binders.  Nutrition - Renal diet w/fluid restrictions. Diastolic HF - volume control with HD 11. DMT2 12. PAF - per chart plan for cardioversion later this month  Jen Mow, PA-C [place] 01/19/2021,2:51 PM  LOS: 1 day

## 2021-01-19 NOTE — Progress Notes (Signed)
Pharmacy Antibiotic Note  Panagiotis Macinnes is a 65 y.o. male admitted on 01/17/2021 pm on antibiotics for right LE cellulitis and sepsis coverage.  Pharmacy has been consulted for Vancomycin dosing.  Also on Ceftriaxone.   Vancomycin 2gm IV given on 7/11 pm ~11:50pm.  ESRD, usual MWF dialysis. Planning HD today.  Plan:  Vancomycin 1gm IV after each HD, MWF  Also on Ceftriaxone 1gm IV q24h.  Follow HD scheduled for any need to re-time Vanc doses.  Follow culture data, clinical progress and antibiotic plans.  Height: '5\' 11"'$  (180.3 cm) Weight: 114.9 kg (253 lb 4.9 oz) IBW/kg (Calculated) : 75.3  Temp (24hrs), Avg:98.1 F (36.7 C), Min:97.9 F (36.6 C), Max:98.4 F (36.9 C)  Recent Labs  Lab 01/17/21 2048 01/17/21 2106 01/18/21 0342 01/18/21 0907 01/18/21 2105 01/19/21 0037 01/19/21 0038 01/19/21 0412  WBC 20.7*  --  19.7*  --   --  19.1*  --   --   CREATININE 7.32*  --  7.26*  --   --  5.10*  --   --   LATICACIDVEN  --  1.2  --  1.6  1.4 1.4  --  1.2 1.1     Allergies  Allergen Reactions   Morphine Other (See Comments)    Per son, [name], pt becomes unresponsive/disoriented. Especially when given after HD tx   Liraglutide Diarrhea    Phenol Phenol     Antimicrobials this admission: Vancomycin 7/11 at 2347 >> Ceftriaxone 7/12 >>    Dose adjustments this admission:  n/a  Microbiology results: 7/11 Blood: no growth x 2 days to date 7/-- urine: sent? 7/11 COVID and flu: negative  Thank you for allowing pharmacy to be a part of this patient's care.  Arty Baumgartner, [place] 01/19/2021 2:49 PM

## 2021-01-20 ENCOUNTER — Inpatient Hospital Stay (HOSPITAL_COMMUNITY): Payer: Medicare HMO | Admitting: Certified Registered Nurse Anesthetist

## 2021-01-20 ENCOUNTER — Encounter (HOSPITAL_COMMUNITY)
Admission: EM | Disposition: A | Discharge status: Skilled Nursing Facility | Payer: Self-pay | Source: Skilled Nursing Facility | Attending: Internal Medicine

## 2021-01-20 ENCOUNTER — Inpatient Hospital Stay (HOSPITAL_COMMUNITY): Payer: Medicare HMO

## 2021-01-20 ENCOUNTER — Encounter (HOSPITAL_COMMUNITY): Payer: Self-pay | Admitting: Internal Medicine

## 2021-01-20 ENCOUNTER — Ambulatory Visit (HOSPITAL_COMMUNITY): Payer: Medicare HMO | Admitting: Nurse Practitioner

## 2021-01-20 DIAGNOSIS — L03031 Cellulitis of right toe: Secondary | ICD-10-CM | POA: Diagnosis not present

## 2021-01-20 DIAGNOSIS — I5033 Acute on chronic diastolic (congestive) heart failure: Secondary | ICD-10-CM | POA: Diagnosis not present

## 2021-01-20 DIAGNOSIS — G9340 Encephalopathy, unspecified: Secondary | ICD-10-CM

## 2021-01-20 DIAGNOSIS — J9621 Acute and chronic respiratory failure with hypoxia: Secondary | ICD-10-CM | POA: Diagnosis not present

## 2021-01-20 HISTORY — PX: I & D EXTREMITY: SHX5045

## 2021-01-20 LAB — POCT I-STAT 7, (LYTES, BLD GAS, ICA,H+H)
Acid-base deficit: 3 mmol/L — ABNORMAL HIGH (ref 0.0–2.0)
Bicarbonate: 21.9 mmol/L (ref 20.0–28.0)
Calcium, Ion: 1.16 mmol/L (ref 1.15–1.40)
HCT: 26 % — ABNORMAL LOW (ref 39.0–52.0)
Hemoglobin: 8.8 g/dL — ABNORMAL LOW (ref 13.0–17.0)
O2 Saturation: 100 %
Potassium: 3.6 mmol/L (ref 3.5–5.1)
Sodium: 136 mmol/L (ref 135–145)
TCO2: 23 mmol/L (ref 22–32)
pCO2 arterial: 36.3 mmHg (ref 32.0–48.0)
pH, Arterial: 7.388 (ref 7.350–7.450)
pO2, Arterial: 369 mmHg — ABNORMAL HIGH (ref 83.0–108.0)

## 2021-01-20 LAB — COMPREHENSIVE METABOLIC PANEL
ALT: 9 U/L (ref 0–44)
AST: 28 U/L (ref 15–41)
Albumin: 2.3 g/dL — ABNORMAL LOW (ref 3.5–5.0)
Alkaline Phosphatase: 107 U/L (ref 38–126)
Anion gap: 20 — ABNORMAL HIGH (ref 5–15)
BUN: 21 mg/dL (ref 8–23)
CO2: 21 mmol/L — ABNORMAL LOW (ref 22–32)
Calcium: 8.6 mg/dL — ABNORMAL LOW (ref 8.9–10.3)
Chloride: 96 mmol/L — ABNORMAL LOW (ref 98–111)
Creatinine, Ser: 3.72 mg/dL — ABNORMAL HIGH (ref 0.61–1.24)
GFR, Estimated: 17 mL/min — ABNORMAL LOW (ref >60)
Glucose, Bld: 189 mg/dL — ABNORMAL HIGH (ref 70–99)
Potassium: 3.7 mmol/L (ref 3.5–5.1)
Sodium: 137 mmol/L (ref 135–145)
Total Bilirubin: 1.5 mg/dL — ABNORMAL HIGH (ref 0.3–1.2)
Total Protein: 6.4 g/dL — ABNORMAL LOW (ref 6.5–8.1)

## 2021-01-20 LAB — CBC WITH DIFFERENTIAL/PLATELET
Abs Immature Granulocytes: 0.14 10*3/uL — ABNORMAL HIGH (ref 0.00–0.07)
Basophils Absolute: 0.1 10*3/uL (ref 0.0–0.1)
Basophils Relative: 0 %
Eosinophils Absolute: 0 10*3/uL (ref 0.0–0.5)
Eosinophils Relative: 0 %
HCT: 25.1 % — ABNORMAL LOW (ref 39.0–52.0)
Hemoglobin: 7.9 g/dL — ABNORMAL LOW (ref 13.0–17.0)
Immature Granulocytes: 1 %
Lymphocytes Relative: 4 %
Lymphs Abs: 0.7 10*3/uL (ref 0.7–4.0)
MCH: 27.8 pg (ref 26.0–34.0)
MCHC: 31.5 g/dL (ref 30.0–36.0)
MCV: 88.4 fL (ref 80.0–100.0)
Monocytes Absolute: 1.9 10*3/uL — ABNORMAL HIGH (ref 0.1–1.0)
Monocytes Relative: 9 %
Neutro Abs: 17 10*3/uL — ABNORMAL HIGH (ref 1.7–7.7)
Neutrophils Relative %: 86 %
Platelets: 235 10*3/uL (ref 150–400)
RBC: 2.84 MIL/uL — ABNORMAL LOW (ref 4.22–5.81)
RDW: 18.2 % — ABNORMAL HIGH (ref 11.5–15.5)
WBC: 19.8 10*3/uL — ABNORMAL HIGH (ref 4.0–10.5)
nRBC: 0 % (ref 0.0–0.2)

## 2021-01-20 LAB — GLUCOSE, CAPILLARY
Glucose-Capillary: 166 mg/dL — ABNORMAL HIGH (ref 70–99)
Glucose-Capillary: 167 mg/dL — ABNORMAL HIGH (ref 70–99)
Glucose-Capillary: 169 mg/dL — ABNORMAL HIGH (ref 70–99)
Glucose-Capillary: 173 mg/dL — ABNORMAL HIGH (ref 70–99)
Glucose-Capillary: 175 mg/dL — ABNORMAL HIGH (ref 70–99)
Glucose-Capillary: 180 mg/dL — ABNORMAL HIGH (ref 70–99)
Glucose-Capillary: 187 mg/dL — ABNORMAL HIGH (ref 70–99)

## 2021-01-20 LAB — SURGICAL PCR SCREEN
MRSA, PCR: POSITIVE — AB
Staphylococcus aureus: POSITIVE — AB

## 2021-01-20 IMAGING — DX DG CHEST 1V PORT
2 series · 2 of 2 positions shown · non-contrast
Comparison: [DATE]

CLINICAL DATA: Respiratory failure

EXAM:
PORTABLE CHEST 1 VIEW

[chest ap (1 of 2)]
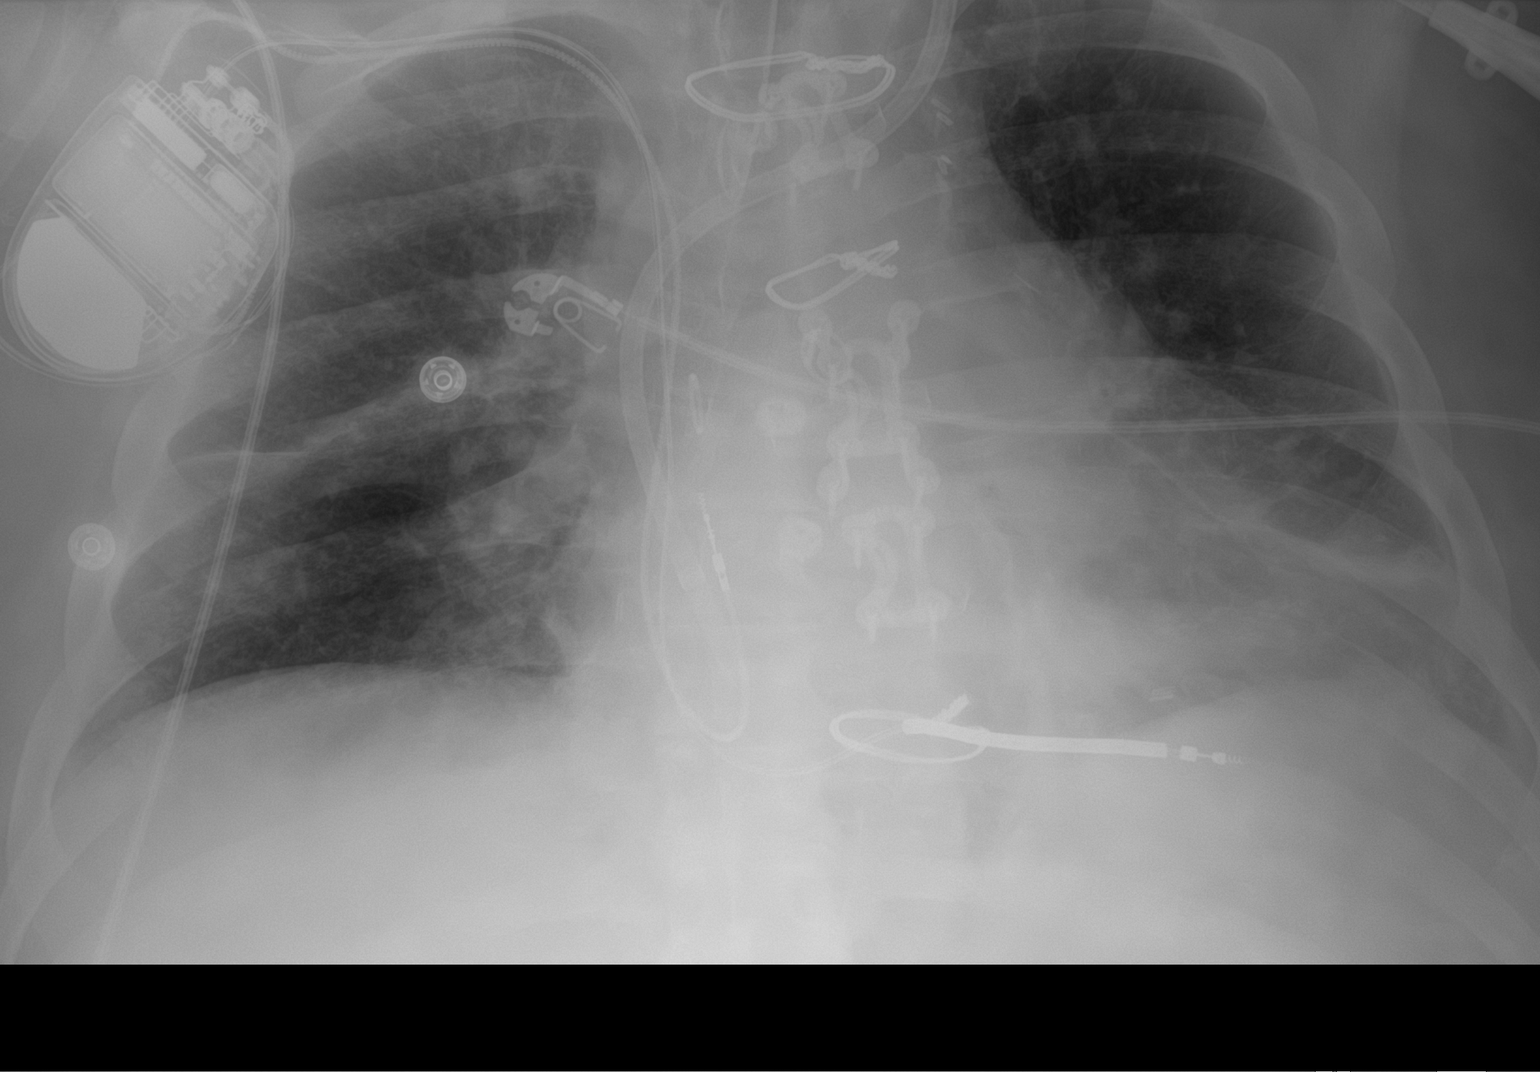

[chest ap (2 of 2)]
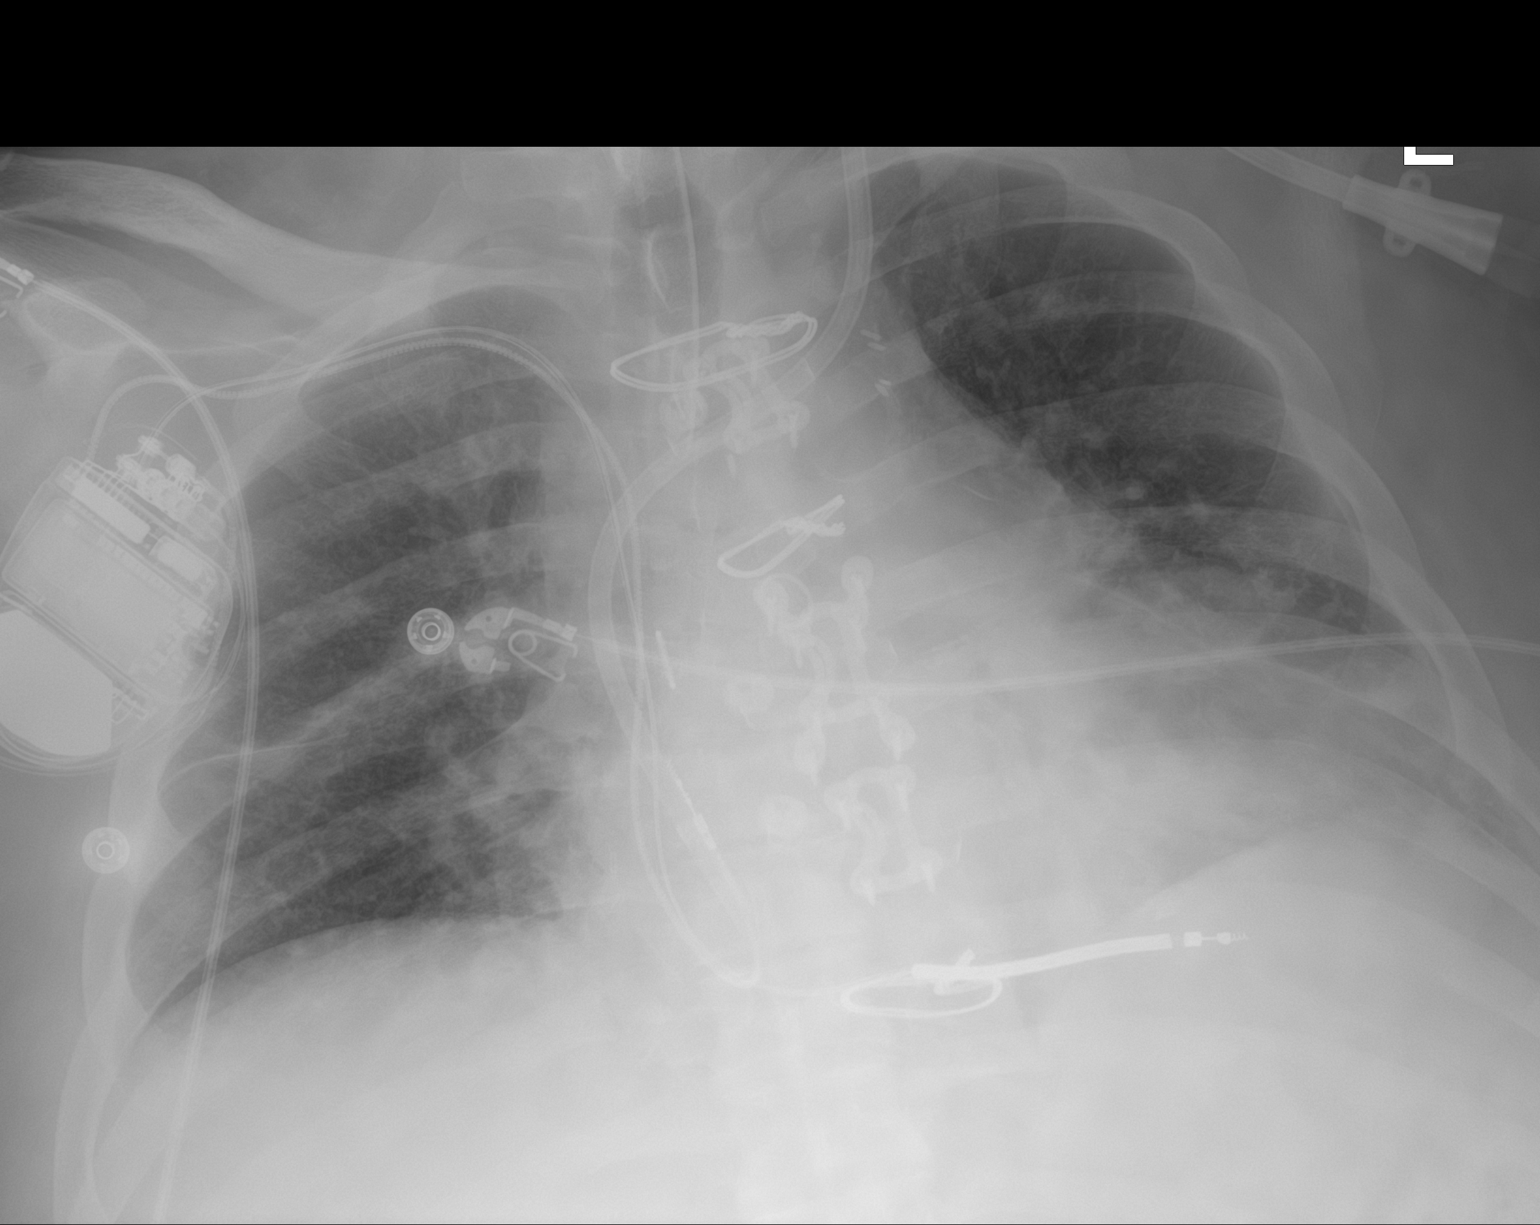

[2 of 2 positions shown; findings below may reference images not displayed]

FINDINGS: Cardiac shadow is enlarged but stable. Postsurgical changes are
again seen. Endotracheal tube is noted approximately 2 cm above the
carina. Left jugular dialysis catheter is again seen and stable.
Defibrillator is again noted and stable. Lungs are hypoinflated with
crowding of the vascular markings similar to that seen on the prior
exam. Persistent vascular congestion remains.
IMPRESSION: Persistent vascular congestion.

Tubes and lines as described in satisfactory position.

## 2021-01-20 IMAGING — CT CT FEMUR *R* W/O CM
2 series · 10 of 29 positions shown, 11 images · non-contrast
Comparison: Right tibia-fibula CT [DATE]

CLINICAL DATA: Right thigh swelling, mass. Downtrending hemoglobin.

EXAM:
CT OF THE LOWER RIGHT EXTREMITY WITHOUT CONTRAST
TECHNIQUE: Multidetector CT imaging of the right lower extremity was performed
according to the standard protocol.

[Series 5: lfov ext 3.0 b40s · axial · 0.65mm/px · z∈[+571,+952]mm · 4 of 182 slices shown, 5 images]
[im 28/182  soft-tissue]
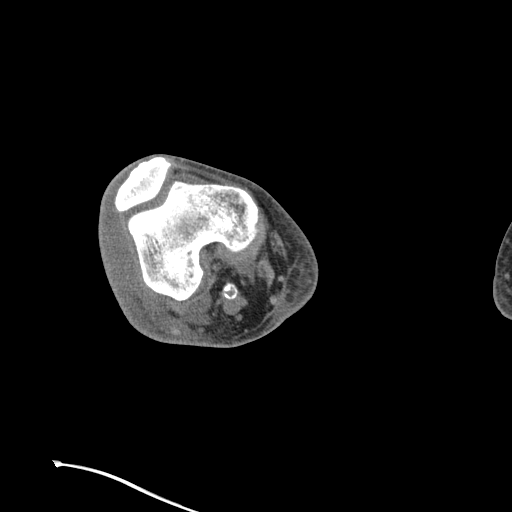
[im 28/182  bone]
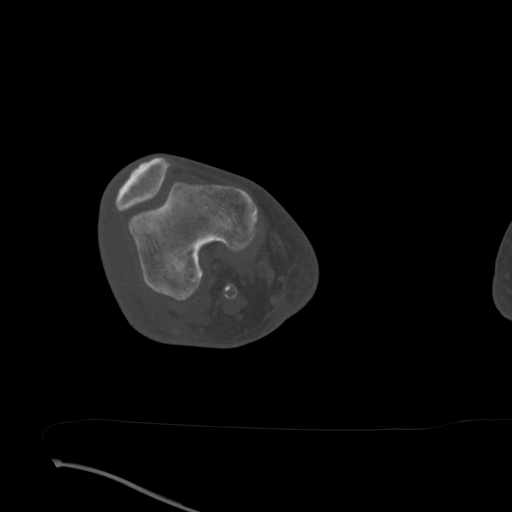
[im 70/182  bone]
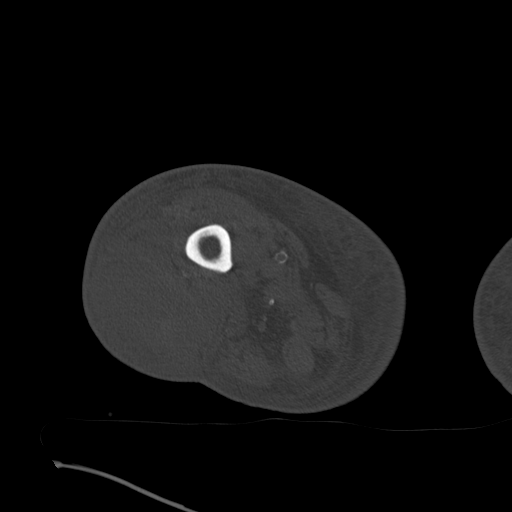
[im 112/182  bone]
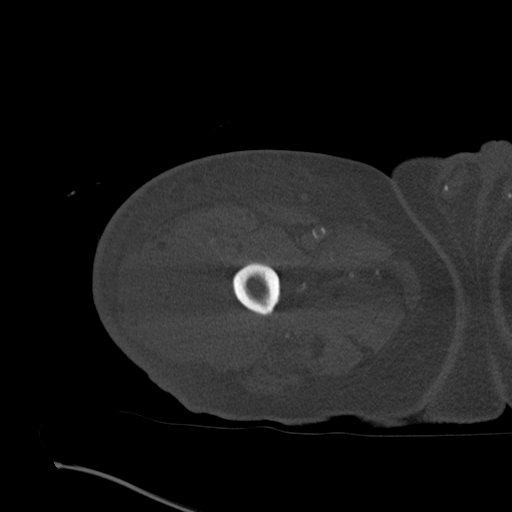
[im 154/182  bone]
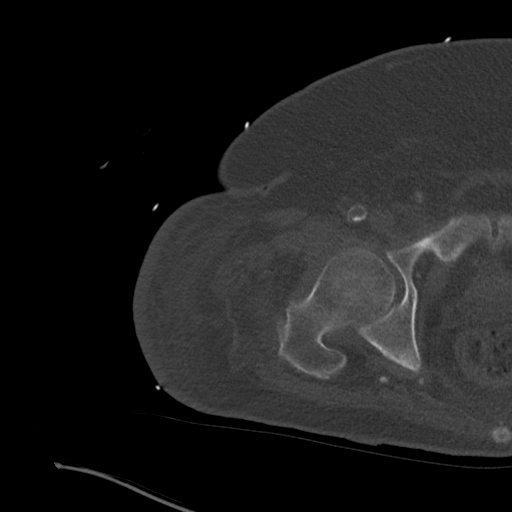

[Series 11: sagittalsoft tissue · sagittal · 0.49mm/px · 6 of 123 slices shown]
[im 41/123  bone]
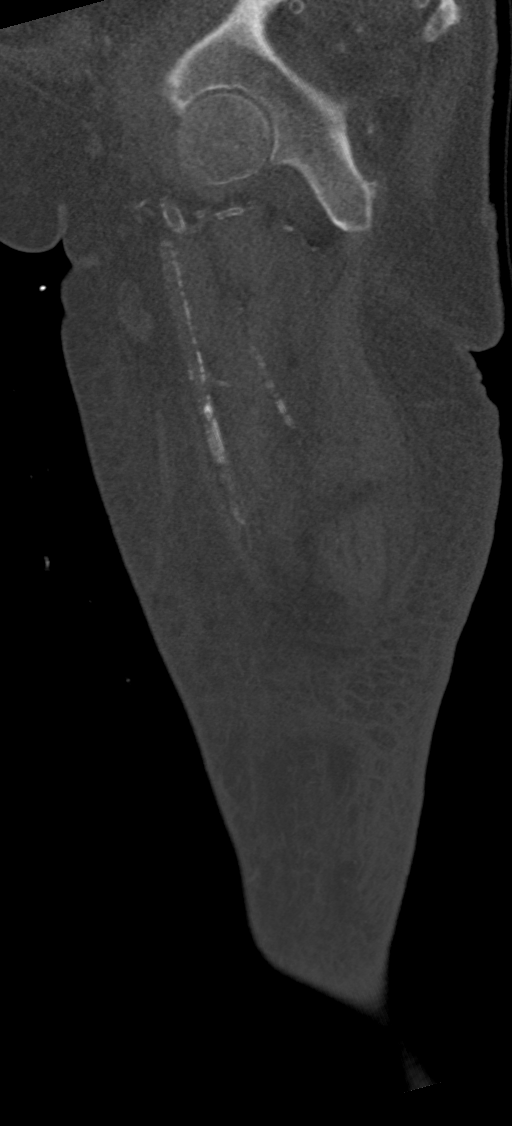
[im 51/123  bone]
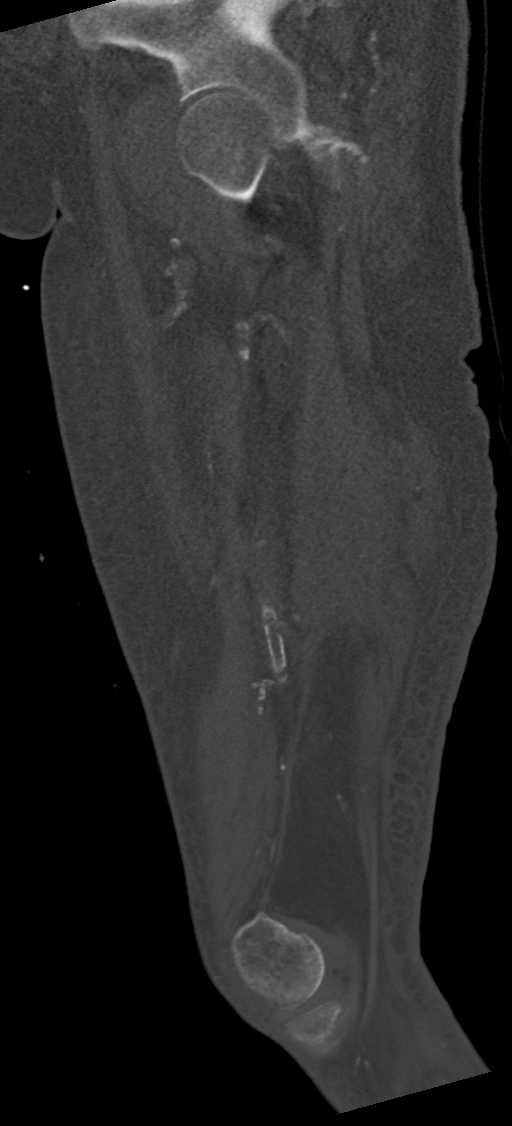
[im 62/123  bone]
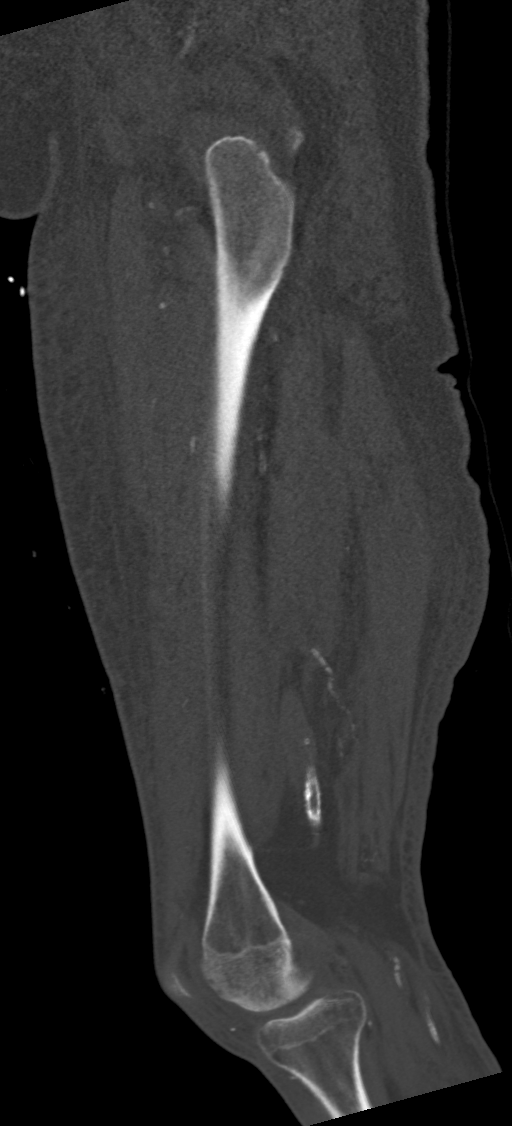
[im 63/123  soft-tissue]
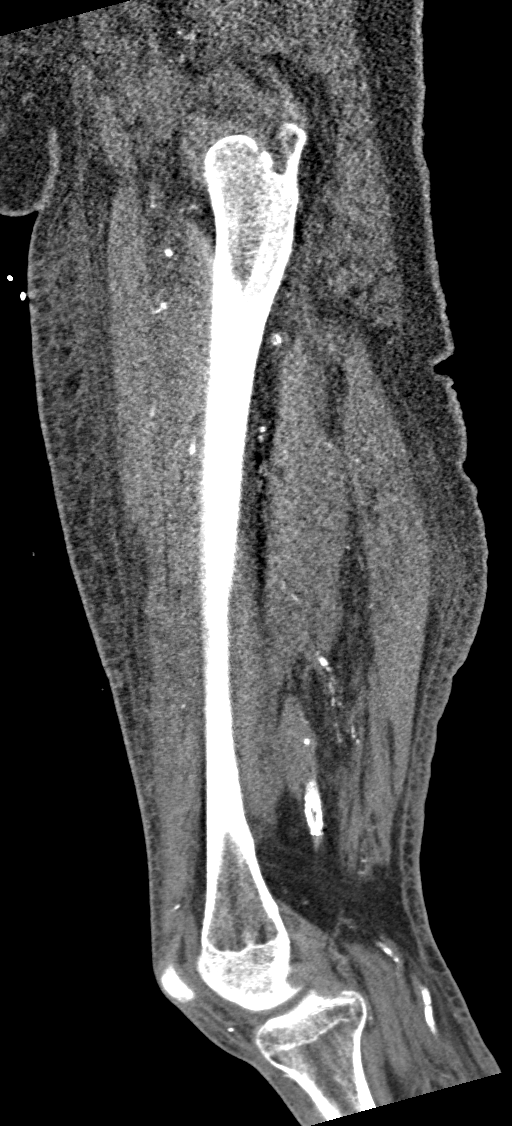
[im 72/123  bone]
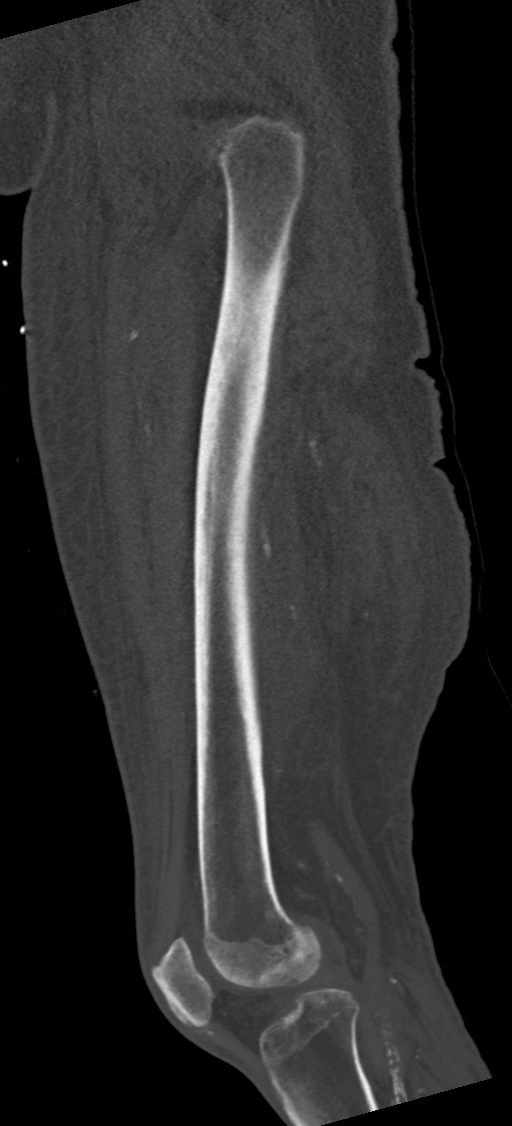
[im 82/123  bone]
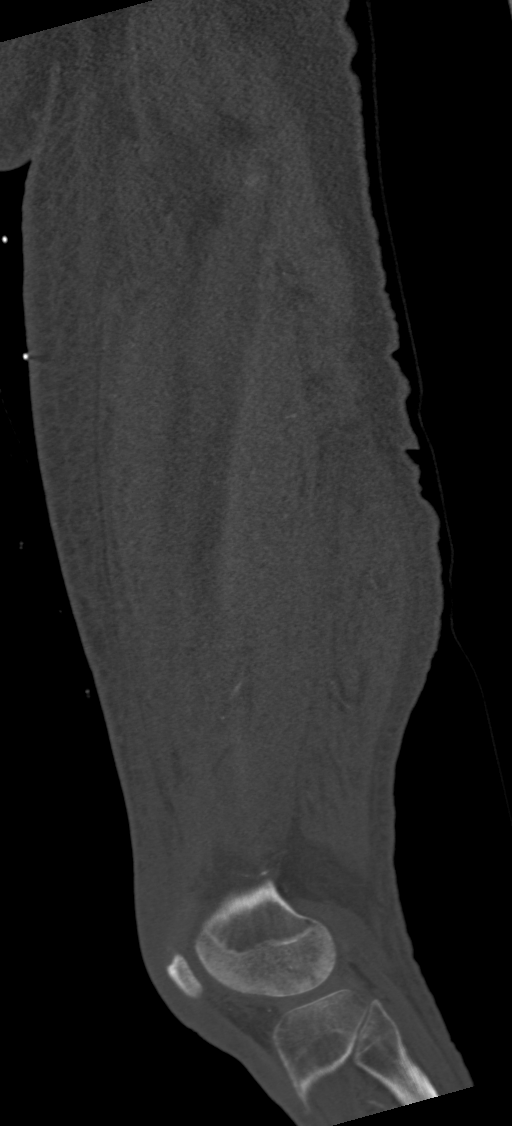

[10 of 29 positions shown; findings below may reference images not displayed]

FINDINGS: Bones/Joint/Cartilage

Right femur intact without fracture or dislocation. Mild diffuse
right hip joint space narrowing. No appreciable right hip joint
effusion. Mild medial compartment joint space narrowing of the right
knee with small knee joint effusion. No bony erosion or periosteal
elevation.

Ligaments

Suboptimally assessed by CT.

Muscles and Tendons

Large mixed density collection predominantly located within the
right vastus lateralis muscle measuring approximately 29 x 7 x 10 cm
(series 10, image 73; series 5, image 91). There are multiple foci
of air within the collection. Collection extends from the level of
the proximal femoral diaphysis to the level of the distal femoral
metaphysis. No additional intramuscular collections. No acute
tendinous abnormality is evident.

Soft tissues

Extensive circumferential subcutaneous edema throughout the right
thigh with associated skin thickening. No foci of gas within the
soft tissues at outside of the previously described collection.
Mildly prominent right inguinal lymph nodes are likely reactive.
IMPRESSION: 1. Large mixed density collection predominantly located within the
right vastus lateralis muscle measuring approximately 29 x 7 x 10 cm
with multiple foci of air within the collection. Findings are
concerning for hematoma with superimposed infection.
2. Extensive circumferential subcutaneous edema throughout the right
thigh with associated skin thickening, suggesting cellulitis. No
foci of gas within the soft tissues at outside of the previously
described collection.
3. No acute osseous abnormality of the right femur.

These results will be called to the ordering clinician or
representative by the Radiologist Assistant, and communication
documented in the PACS or [REDACTED].

## 2021-01-20 IMAGING — DX DG ABDOMEN 1V
1 series · 2 of 2 positions shown · non-contrast
Comparison: None.

CLINICAL DATA: Check gastric catheter placement

EXAM:
ABDOMEN - 1 VIEW

[Series 1: abdomen · 0.14mm/px · 2 of 2 slices shown]
[im 1/2]
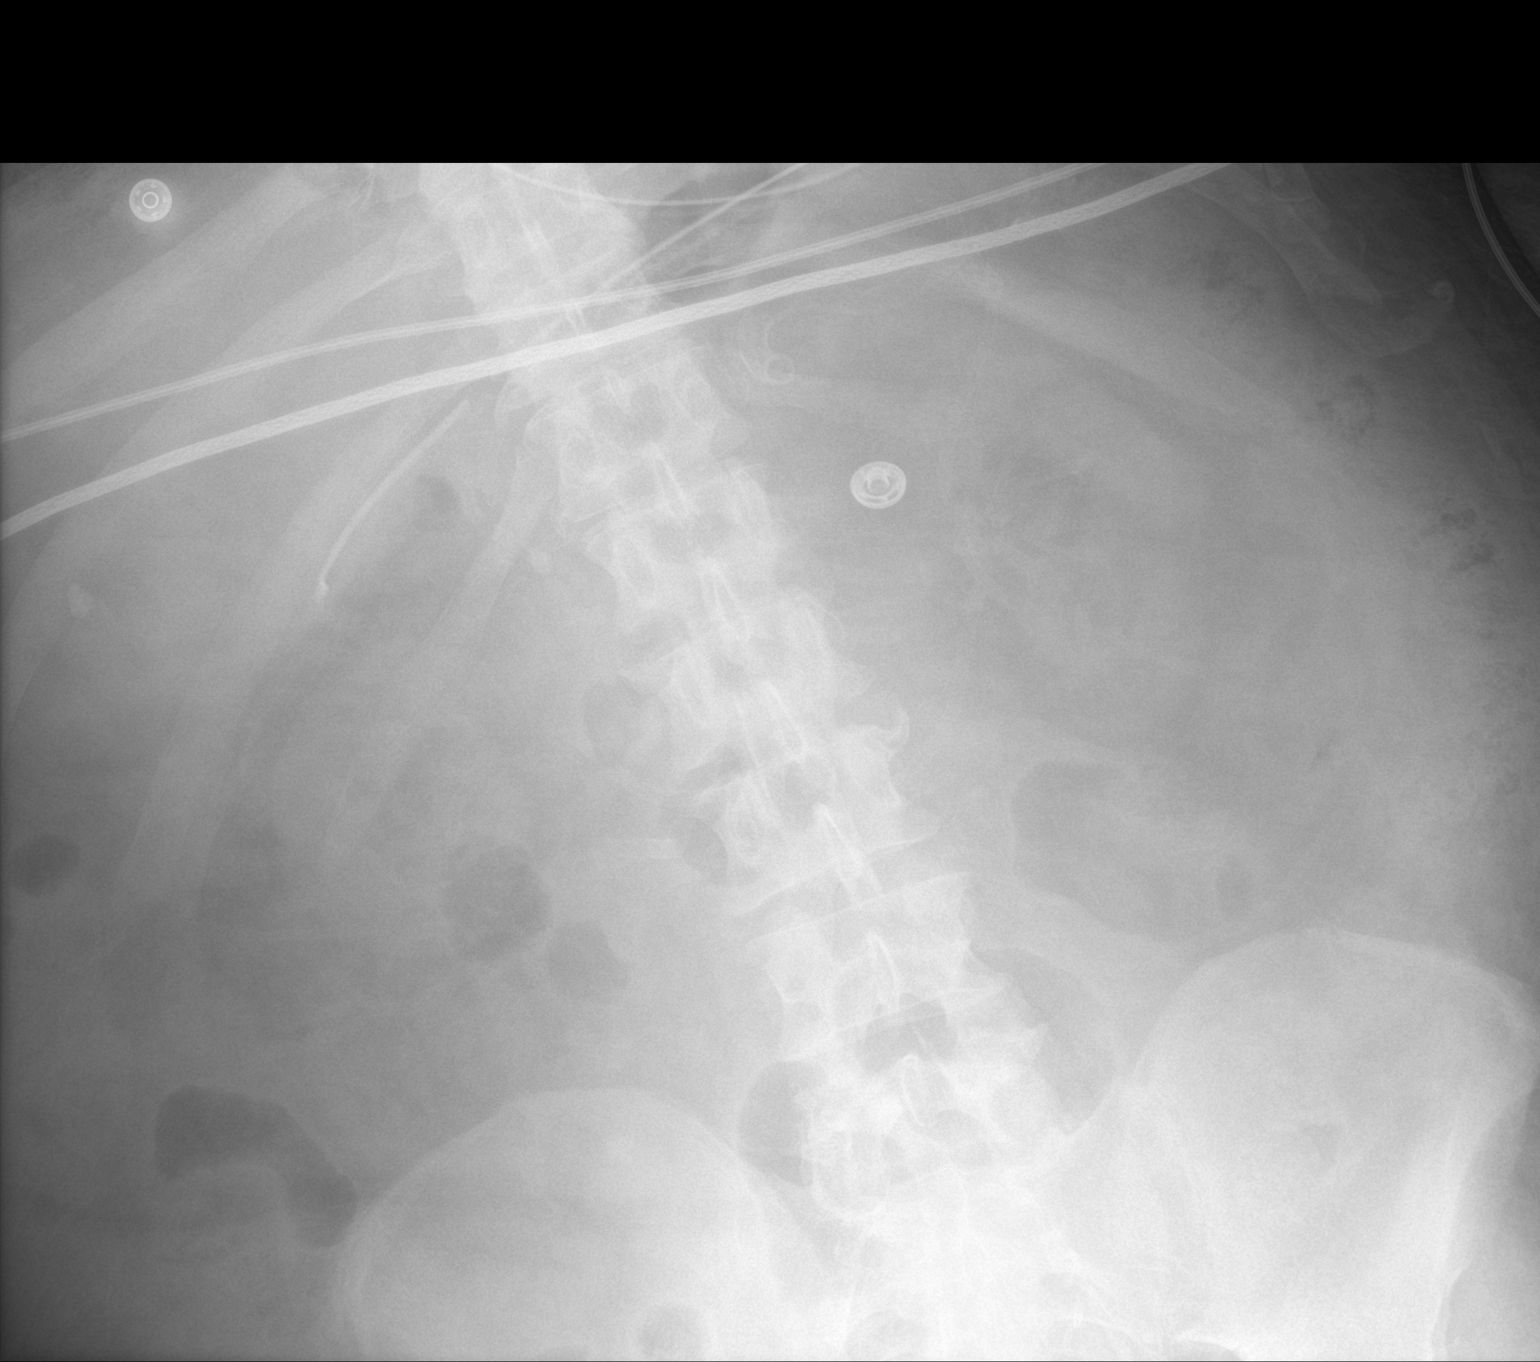
[im 2/2]
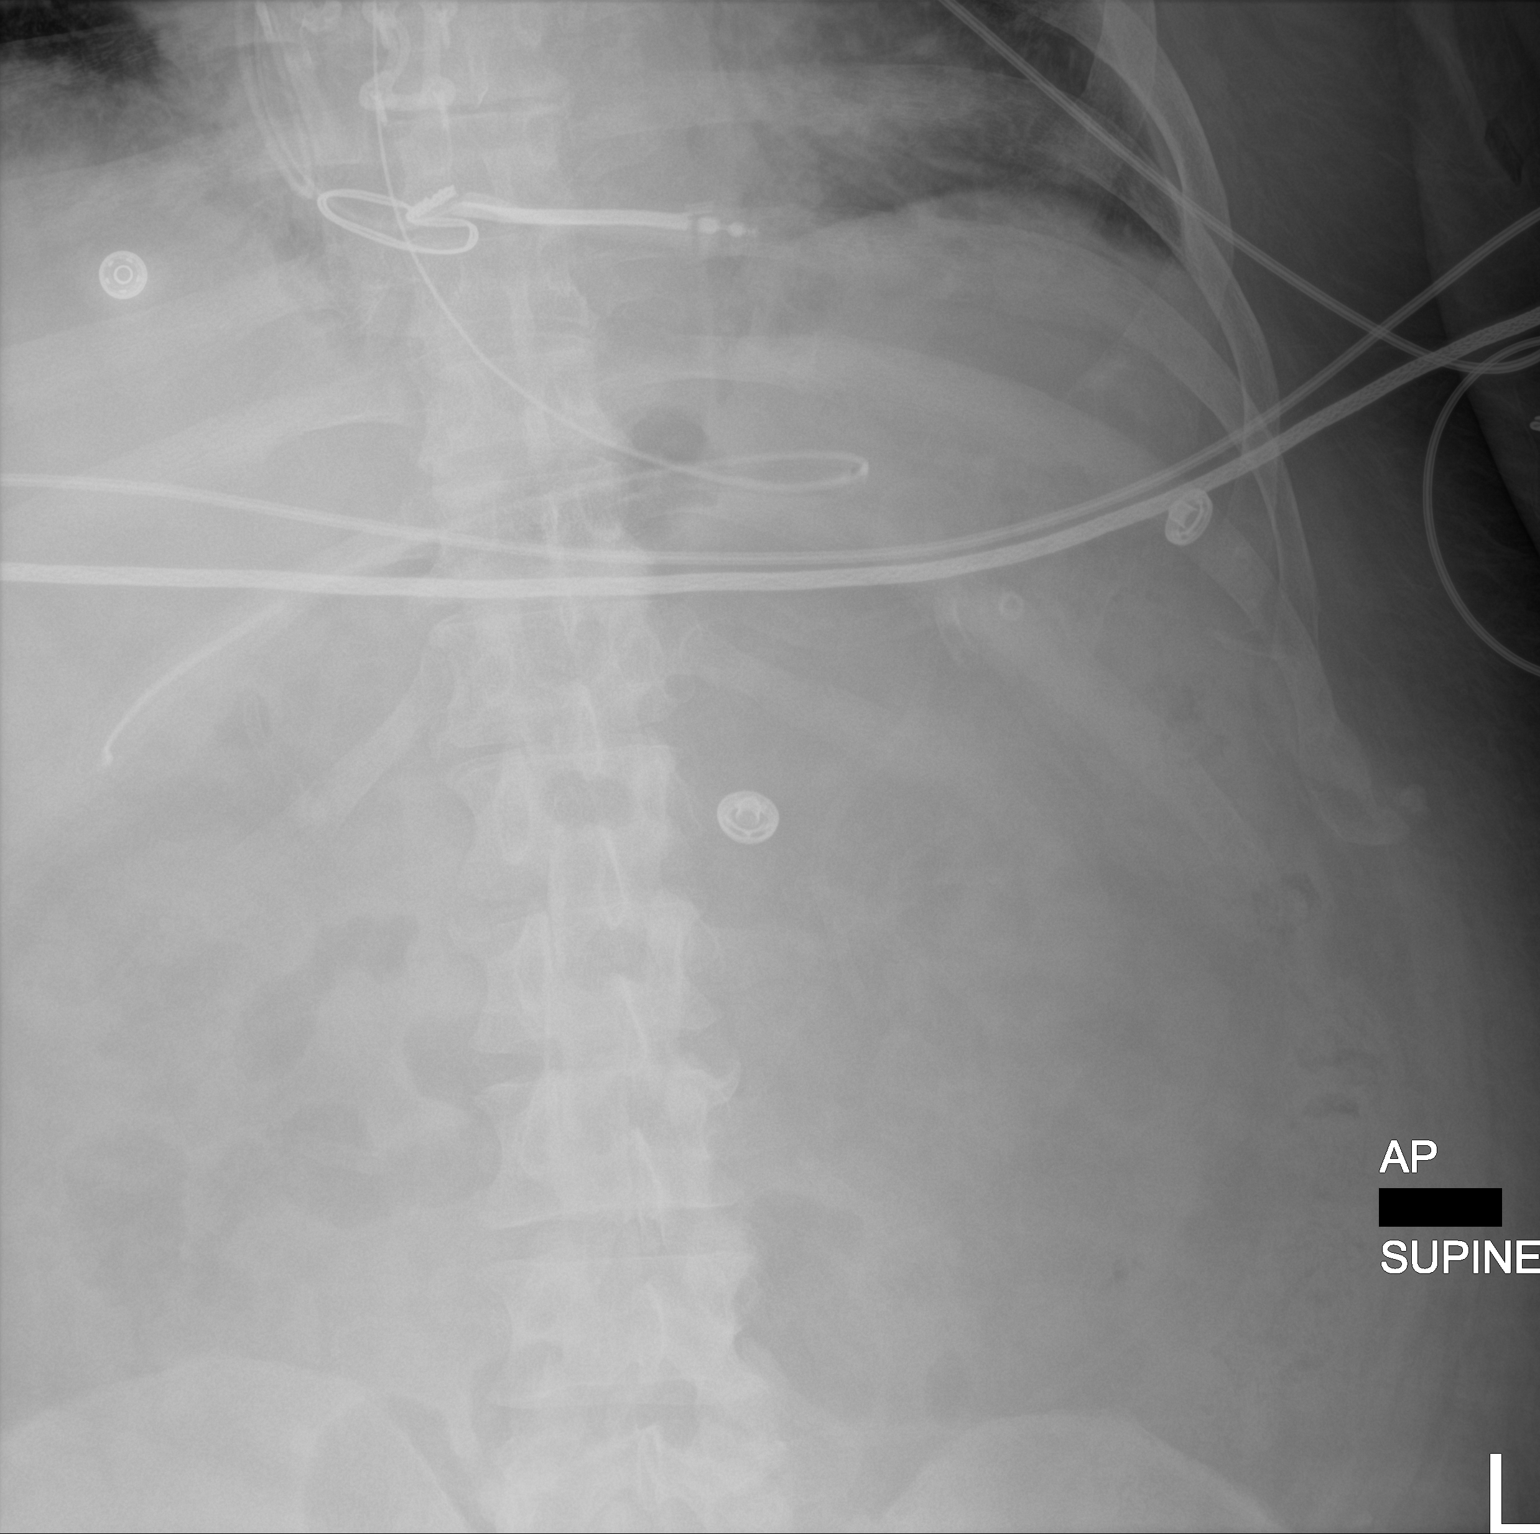

[2 of 2 positions shown; findings below may reference images not displayed]

FINDINGS: Gastric catheter is noted deep within the stomach. No obstructive
changes are seen. No bony abnormality is noted.
IMPRESSION: Gastric catheter in the distal stomach.

## 2021-01-20 SURGERY — IRRIGATION AND DEBRIDEMENT EXTREMITY
Anesthesia: General | Site: Leg Upper | Laterality: Right

## 2021-01-20 MED ORDER — CHLORHEXIDINE GLUCONATE 4 % EX LIQD: 60.0000 mL | Freq: Once | CUTANEOUS | Status: DC

## 2021-01-20 MED ORDER — SODIUM CHLORIDE 0.9 % IR SOLN
Status: DC | PRN
  Administered 2021-01-20: 3000 mL

## 2021-01-20 MED ORDER — ROCURONIUM BROMIDE 10 MG/ML (PF) SYRINGE
PREFILLED_SYRINGE | INTRAVENOUS | Status: DC | PRN
  Administered 2021-01-20: 30 mg via INTRAVENOUS
  Administered 2021-01-20: 50 mg via INTRAVENOUS

## 2021-01-20 MED ORDER — ARTIFICIAL TEARS OPHTHALMIC OINT
TOPICAL_OINTMENT | OPHTHALMIC | Status: AC
  Filled 2021-01-20: qty 3.5

## 2021-01-20 MED ORDER — PIPERACILLIN-TAZOBACTAM 3.375 G IVPB 30 MIN: 3.3750 g | Freq: Four times a day (QID) | INTRAVENOUS | Status: DC

## 2021-01-20 MED ORDER — ALBUMIN HUMAN 5 % IV SOLN: INTRAVENOUS | Status: DC | PRN

## 2021-01-20 MED ORDER — CHLORHEXIDINE GLUCONATE CLOTH 2 % EX PADS
6.0000 | MEDICATED_PAD | Freq: Every day | CUTANEOUS | Status: DC
  Administered 2021-01-21 – 2021-01-28 (×8): 6 via TOPICAL

## 2021-01-20 MED ORDER — POVIDONE-IODINE 10 % EX SWAB: 2.0000 "application " | Freq: Once | CUTANEOUS | Status: DC

## 2021-01-20 MED ORDER — DOCUSATE SODIUM 50 MG/5ML PO LIQD
100.0000 mg | Freq: Two times a day (BID) | ORAL | Status: DC
  Administered 2021-01-21: 100 mg
  Filled 2021-01-20: qty 10

## 2021-01-20 MED ORDER — MIDAZOLAM HCL 2 MG/2ML IJ SOLN
INTRAMUSCULAR | Status: AC
  Filled 2021-01-20: qty 2

## 2021-01-20 MED ORDER — POLYETHYLENE GLYCOL 3350 17 G PO PACK
17.0000 g | PACK | Freq: Every day | ORAL | Status: DC
  Administered 2021-01-21: 17 g
  Filled 2021-01-20: qty 1

## 2021-01-20 MED ORDER — LIDOCAINE 2% (20 MG/ML) 5 ML SYRINGE
INTRAMUSCULAR | Status: DC | PRN
  Administered 2021-01-20: 100 mg via INTRAVENOUS

## 2021-01-20 MED ORDER — VASOPRESSIN 20 UNIT/ML IV SOLN
INTRAVENOUS | Status: AC
  Filled 2021-01-20: qty 1

## 2021-01-20 MED ORDER — PHENYLEPHRINE 40 MCG/ML (10ML) SYRINGE FOR IV PUSH (FOR BLOOD PRESSURE SUPPORT)
PREFILLED_SYRINGE | INTRAVENOUS | Status: AC
  Filled 2021-01-20: qty 10

## 2021-01-20 MED ORDER — PHENYLEPHRINE 40 MCG/ML (10ML) SYRINGE FOR IV PUSH (FOR BLOOD PRESSURE SUPPORT)
PREFILLED_SYRINGE | INTRAVENOUS | Status: DC | PRN
  Administered 2021-01-20 (×3): 120 ug via INTRAVENOUS

## 2021-01-20 MED ORDER — CEFAZOLIN SODIUM-DEXTROSE 2-4 GM/100ML-% IV SOLN
INTRAVENOUS | Status: AC
  Filled 2021-01-20: qty 100

## 2021-01-20 MED ORDER — FENTANYL CITRATE (PF) 250 MCG/5ML IJ SOLN
INTRAMUSCULAR | Status: AC
  Filled 2021-01-20: qty 5

## 2021-01-20 MED ORDER — PIPERACILLIN-TAZOBACTAM IN DEX 2-0.25 GM/50ML IV SOLN
2.2500 g | Freq: Three times a day (TID) | INTRAVENOUS | Status: DC
  Administered 2021-01-20 – 2021-01-24 (×12): 2.25 g via INTRAVENOUS
  Filled 2021-01-20 (×13): qty 50

## 2021-01-20 MED ORDER — VASOPRESSIN 20 UNIT/ML IV SOLN
INTRAVENOUS | Status: DC | PRN
  Administered 2021-01-20: 1 [IU] via INTRAVENOUS

## 2021-01-20 MED ORDER — ONDANSETRON HCL 4 MG/2ML IJ SOLN
INTRAMUSCULAR | Status: AC
  Filled 2021-01-20: qty 2

## 2021-01-20 MED ORDER — ROCURONIUM BROMIDE 10 MG/ML (PF) SYRINGE
PREFILLED_SYRINGE | INTRAVENOUS | Status: AC
  Filled 2021-01-20: qty 10

## 2021-01-20 MED ORDER — SODIUM CHLORIDE 0.9 % IV SOLN: INTRAVENOUS | Status: DC | PRN

## 2021-01-20 MED ORDER — CHLORHEXIDINE GLUCONATE 0.12 % MT SOLN
OROMUCOSAL | Status: AC
  Filled 2021-01-20: qty 15

## 2021-01-20 MED ORDER — PROPOFOL 1000 MG/100ML IV EMUL
0.0000 ug/kg/min | INTRAVENOUS | Status: DC
  Administered 2021-01-20 (×2): 50 ug/kg/min via INTRAVENOUS
  Administered 2021-01-21: 30 ug/kg/min via INTRAVENOUS
  Administered 2021-01-21: 50 ug/kg/min via INTRAVENOUS
  Filled 2021-01-20 (×2): qty 200
  Filled 2021-01-20: qty 100

## 2021-01-20 MED ORDER — PROPOFOL 10 MG/ML IV BOLUS
INTRAVENOUS | Status: DC | PRN
  Administered 2021-01-20: 70 mg via INTRAVENOUS

## 2021-01-20 MED ORDER — PROPOFOL 500 MG/50ML IV EMUL
INTRAVENOUS | Status: DC | PRN
  Administered 2021-01-20: 50 ug/kg/min via INTRAVENOUS

## 2021-01-20 MED ORDER — PHENYLEPHRINE HCL-NACL 10-0.9 MG/250ML-% IV SOLN
INTRAVENOUS | Status: DC | PRN
  Administered 2021-01-20: 20 ug/min via INTRAVENOUS

## 2021-01-20 MED ORDER — CEFAZOLIN SODIUM-DEXTROSE 2-4 GM/100ML-% IV SOLN: 2.0000 g | INTRAVENOUS | Status: DC

## 2021-01-20 MED ORDER — PHENYLEPHRINE HCL-NACL 10-0.9 MG/250ML-% IV SOLN
INTRAVENOUS | Status: AC
  Administered 2021-01-20: 40 mg
  Filled 2021-01-20: qty 250

## 2021-01-20 MED ORDER — PHENYLEPHRINE HCL-NACL 10-0.9 MG/250ML-% IV SOLN
0.0000 ug/min | INTRAVENOUS | Status: DC
Start: 2021-01-20 — End: 2021-01-22
  Administered 2021-01-20: 30 ug/min via INTRAVENOUS
  Administered 2021-01-20: 40 ug/min via INTRAVENOUS
  Administered 2021-01-21: 10 ug/min via INTRAVENOUS
  Filled 2021-01-20 (×2): qty 250

## 2021-01-20 MED ORDER — LIDOCAINE 2% (20 MG/ML) 5 ML SYRINGE
INTRAMUSCULAR | Status: AC
  Filled 2021-01-20: qty 5

## 2021-01-20 MED ORDER — ONDANSETRON HCL 4 MG/2ML IJ SOLN
INTRAMUSCULAR | Status: DC | PRN
  Administered 2021-01-20: 4 mg via INTRAVENOUS

## 2021-01-20 MED ORDER — SODIUM CHLORIDE 0.9% FLUSH: 10.0000 mL | INTRAVENOUS | Status: DC | PRN

## 2021-01-20 MED ORDER — FENTANYL CITRATE (PF) 250 MCG/5ML IJ SOLN
INTRAMUSCULAR | Status: DC | PRN
  Administered 2021-01-20: 100 ug via INTRAVENOUS

## 2021-01-20 SURGICAL SUPPLY — 70 items
ALCOHOL 70% 16 OZ (MISCELLANEOUS) IMPLANT
BAG COUNTER SPONGE SURGICOUNT (BAG) ×2 IMPLANT
BLADE SURG 10 STRL SS (BLADE) ×2 IMPLANT
BNDG COHESIVE 1X5 TAN STRL LF (GAUZE/BANDAGES/DRESSINGS) IMPLANT
BNDG COHESIVE 4X5 TAN STRL (GAUZE/BANDAGES/DRESSINGS) IMPLANT
BNDG COHESIVE 6X5 TAN STRL LF (GAUZE/BANDAGES/DRESSINGS) ×4 IMPLANT
BNDG CONFORM 3 STRL LF (GAUZE/BANDAGES/DRESSINGS) IMPLANT
BNDG ELASTIC 3X5.8 VLCR STR LF (GAUZE/BANDAGES/DRESSINGS) IMPLANT
BNDG GAUZE ELAST 4 BULKY (GAUZE/BANDAGES/DRESSINGS) ×6 IMPLANT
CANISTER WOUNDNEG PRESSURE 500 (CANNISTER) ×2 IMPLANT
CORD BIPOLAR FORCEPS 12FT (ELECTRODE) IMPLANT
COVER SURGICAL LIGHT HANDLE (MISCELLANEOUS) ×2 IMPLANT
CUFF TOURN SGL QUICK 24 (TOURNIQUET CUFF)
CUFF TOURN SGL QUICK 34 (TOURNIQUET CUFF) ×2
CUFF TOURN SGL QUICK 42 (TOURNIQUET CUFF) IMPLANT
CUFF TRNQT CYL 24X4X16.5-23 (TOURNIQUET CUFF) IMPLANT
CUFF TRNQT CYL 34X4.125X (TOURNIQUET CUFF) ×2 IMPLANT
DRAIN JACKSON PRT FLT 10 (DRAIN) ×2 IMPLANT
DRAPE EXTREMITY BILATERAL (DRAPES) IMPLANT
DRAPE IMP U-DRAPE 54X76 (DRAPES) IMPLANT
DRAPE INCISE IOBAN 66X45 STRL (DRAPES) ×8 IMPLANT
DRAPE SURG 17X23 STRL (DRAPES) IMPLANT
DRAPE U-SHAPE 47X51 STRL (DRAPES) ×2 IMPLANT
DRSG PAD ABDOMINAL 8X10 ST (GAUZE/BANDAGES/DRESSINGS) ×2 IMPLANT
DRSG VAC ATS SM SENSATRAC (GAUZE/BANDAGES/DRESSINGS) ×2 IMPLANT
DURAPREP 26ML APPLICATOR (WOUND CARE) ×2 IMPLANT
ELECT CAUTERY BLADE 6.4 (BLADE) ×2 IMPLANT
ELECT REM PT RETURN 9FT ADLT (ELECTROSURGICAL)
ELECTRODE REM PT RTRN 9FT ADLT (ELECTROSURGICAL) IMPLANT
FACESHIELD WRAPAROUND (MASK) IMPLANT
GAUZE SPONGE 4X4 12PLY STRL (GAUZE/BANDAGES/DRESSINGS) ×4 IMPLANT
GAUZE XEROFORM 1X8 LF (GAUZE/BANDAGES/DRESSINGS) ×2 IMPLANT
GAUZE XEROFORM 5X9 LF (GAUZE/BANDAGES/DRESSINGS) ×2 IMPLANT
GLOVE SRG 8 PF TXTR STRL LF DI (GLOVE) ×2 IMPLANT
GLOVE SURG ENC MOIS LTX SZ7.5 (GLOVE) ×4 IMPLANT
GLOVE SURG UNDER POLY LF SZ8 (GLOVE) ×2
GOWN STRL REUS W/ TWL LRG LVL3 (GOWN DISPOSABLE) ×2 IMPLANT
GOWN STRL REUS W/ TWL XL LVL3 (GOWN DISPOSABLE) ×2 IMPLANT
GOWN STRL REUS W/TWL LRG LVL3 (GOWN DISPOSABLE) ×2
GOWN STRL REUS W/TWL XL LVL3 (GOWN DISPOSABLE) ×2
HANDPIECE INTERPULSE COAX TIP (DISPOSABLE)
KIT BASIN OR (CUSTOM PROCEDURE TRAY) ×2 IMPLANT
KIT TURNOVER KIT B (KITS) ×2 IMPLANT
MANIFOLD NEPTUNE II (INSTRUMENTS) ×2 IMPLANT
NS IRRIG 1000ML POUR BTL (IV SOLUTION) ×4 IMPLANT
PACK ORTHO EXTREMITY (CUSTOM PROCEDURE TRAY) ×2 IMPLANT
PAD ABD 8X10 STRL (GAUZE/BANDAGES/DRESSINGS) ×2 IMPLANT
PAD ARMBOARD 7.5X6 YLW CONV (MISCELLANEOUS) ×4 IMPLANT
PADDING CAST ABS 4INX4YD NS (CAST SUPPLIES) ×2
PADDING CAST ABS COTTON 4X4 ST (CAST SUPPLIES) ×2 IMPLANT
PADDING CAST COTTON 6X4 STRL (CAST SUPPLIES) ×2 IMPLANT
SET CYSTO W/LG BORE CLAMP LF (SET/KITS/TRAYS/PACK) ×2 IMPLANT
SET HNDPC FAN SPRY TIP SCT (DISPOSABLE) IMPLANT
SPONGE T-LAP 18X18 ~~LOC~~+RFID (SPONGE) ×4 IMPLANT
STOCKINETTE IMPERVIOUS 9X36 MD (GAUZE/BANDAGES/DRESSINGS) ×2 IMPLANT
SUT ETHILON 2 0 FS 18 (SUTURE) IMPLANT
SUT ETHILON 2 0 PSLX (SUTURE) ×4 IMPLANT
SUT ETHILON 3 0 PS 1 (SUTURE) IMPLANT
SUT PDS AB 2-0 CT1 27 (SUTURE) ×2 IMPLANT
SUT VIC AB 2-0 CT1 36 (SUTURE) IMPLANT
SUT VIC AB 2-0 FS1 27 (SUTURE) IMPLANT
SWAB CULTURE ESWAB REG 1ML (MISCELLANEOUS) IMPLANT
SYR CONTROL 10ML LL (SYRINGE) IMPLANT
TOWEL GREEN STERILE (TOWEL DISPOSABLE) ×2 IMPLANT
TOWEL GREEN STERILE FF (TOWEL DISPOSABLE) ×2 IMPLANT
TUBE CONNECTING 12X1/4 (SUCTIONS) ×2 IMPLANT
TUBE FEEDING ENTERAL 5FR 16IN (TUBING) IMPLANT
UNDERPAD 30X36 HEAVY ABSORB (UNDERPADS AND DIAPERS) ×4 IMPLANT
WATER STERILE IRR 1000ML POUR (IV SOLUTION) ×2 IMPLANT
YANKAUER SUCT BULB TIP NO VENT (SUCTIONS) ×2 IMPLANT

## 2021-01-20 NOTE — Progress Notes (Signed)
We were called for Large mixed density collection predominantly located within the right vastus lateralis muscle measuring approximately 29 x 7 x 10 cm seen on CT of the right thigh. Seen with my attending, Dr. Donne Hazel. Patient appears to have overlying cellulitis that extends beyond the level of the knee to the lower leg along with tenderness over the knee. Would recommend Orthopedic evaluation. I have reached back out to [facility] with these recommendations. Please call back with any questions or concerns.

## 2021-01-20 NOTE — Progress Notes (Signed)
Spoke with Dr Tobias Alexander from Anesthesia, considering patient's somnolence prior surgical intervention and the nature of is severe infection and surgical intervention, plan is to keep patient on mechanical ventilation after surgical procedure, to allow clinically stability.    Critical care medicine has been consulted for post op follow up.

## 2021-01-20 NOTE — Progress Notes (Signed)
Telephone consent received from the wife Kamden Leaper [phone] and witnessed by two RNs.

## 2021-01-20 NOTE — Progress Notes (Signed)
PROGRESS NOTE    Kyle Walton  [id number] DOB: 05-23-1956 DOA: 01/17/2021 PCP: Patient, No Pcp Per (Inactive)    Brief Narrative:  Mr. Brenneke was admitted to the hospital with the working diagnosis of right lower extremity cellulitis, complicated with severe sepsis (metabolic encephalopathy).    65 year old male past medical history for end-stage renal disease on hemodialysis, type II diabetes mellitus, chronic diastolic heart failure, paroxysmal fibrillation, chronic hypoxic respiratory failure and hypothyroidism who presented with somnolence.  He is a nursing home resident, he was found to have progressive somnolence and confusion for about 1 to 2 days.  Right lower extremity edema and erythema.  On his initial physical examination blood pressure 98/57, heart rate 78, respirate 14, oxygen saturation 98%, his lungs were clear to auscultation, heart S1-S2, present, rhythmic, soft abdomen, positive lower extremity edema.  Right thigh with erythema,   Sodium 138, potassium 4.4, chloride 1 2, bicarb 17 glucose 146, BUN 56, creatinine 7.26.  High sensitive troponin 416-383, white count 19.7, hemoglobin 8.6, hematocrit 27.8, platelets 228.   Patient placed on broad spectrum antibiotic therapy. Continue to have significant pain and induration on right thigh.  Further work up with Ct showed large collection with mix density, located within the right vastus lateralis muscle (29x7x10 cm), with multi foci of air within the collection.   General surgery and orthopedics consulted  Plan for I&D and wound vac placement.    Assessment & Plan:   Principal Problem:   Acute encephalopathy Active Problems:   Diabetes mellitus with peripheral vascular disease (HCC)   History of DVT (deep vein thrombosis)   Essential hypertension   OSA (obstructive sleep apnea)   Paroxysmal atrial fibrillation (HCC)   ESRD on hemodialysis (HCC)   Impaired physical mobility   Acute on chronic respiratory failure  with hypoxia and hypercapnia (HCC)   Cellulitis of right leg   Acute on chronic diastolic (congestive) heart failure (HCC)   Cellulitis   High anion gap metabolic acidosis   Hypotension   Acquired hypothyroidism   End-stage renal disease on hemodialysis (HCC)   Dry gangrene (HCC)   Sepsis, unspecified organism ([place])   Right lower extremity cellulitis, complicated with sever sepsis (end-organ damage encephalopathy) present on admission. Infected thigh hematoma with acute blood loss anemia Continue to have significant edema, erythema and tenderness, from the thigh to the knee.  WBC continue to be elevated at 19,8.  Blood pressure 115 and [id number] mmHg systolic with HR [id number] to [id number].  Ct right thigh with large collection (29 x 7 x 10 cm) within the lateral vastus muscle, positive air within the collection, suspected infected hematoma.      Plan to continue antibiotic therapy with vancomycin and change ceftriaxone to Zosyn to cover for anaerobes.  Consulted general surgery and orthopedics, plan for I&D and VAC placement.   Continue to hold on anticoagulation.  Hgb is down to 7,9 with hct at 25,1 with Plt 235, continue close follow up on Hgb and Hct.    2. Metabolic encephalopathy. Patient continue somnolent, but reactive, no confusion or agitation. Continue pain control with hydromorphone for now.    3. ESRD on HD. Stable volume status, patient had HD yesterday, today his blood pressure systolic is above 123XX123 mmHg, continue with midodrine.    4. Acute on chronic diastolic heart failure/ hypotension.  Continue blood pressure monitoring. On midodrine for hypotension    5. Paroxysmal atrial fibrillation. Remains reate controlled, continue metoprolol succinate Continue to hold anticoagulation in preparation  for surgical intervention.   6. Obesity class 2 . Calculcated BMI is 35,5/   7. T2DM and dyslipidemia.  Fasting glucose is 189, continue with insulin sliding scale for glucose cover and  monitoring.   On atorvastatin.   8. Hypothyroid. On levothyroxine.    Patient continue to be at high risk for worsening sepsis   Status is: Inpatient  Remains inpatient appropriate because:IV treatments appropriate due to intensity of illness or inability to take PO  Dispo: The patient is from: Home              Anticipated d/c is to: Home              Patient currently is not medically stable to d/c.   Difficult to place patient No   DVT prophylaxis: Scd   Code Status:   full  Family Communication:  I spoke over the phone with the patient's wife about patient's  condition, plan of care, prognosis and all questions were addressed.    Consultants:  Surgery  Orthopedics    Antimicrobials:  Vancomycin  Zosyn     Subjective: Patient continue to be somnolent, persistent severe pain at the right thigh extending into the knee, worse with movement and touch, no nausea or vomiting,   Objective: Vitals:   01/20/21 1030 01/20/21 1132 01/20/21 1200 01/20/21 1300  BP: 135/64  (!) 115/57 (!) 110/59  Pulse: (!) 103  (!) 105 (!) 106  Resp: '20  20 18  '$ Temp:      TempSrc:  Oral    SpO2: 95%  95% 95%  Weight:      Height:        Intake/Output Summary (Last 24 hours) at 01/20/2021 1449 Last data filed at 01/20/2021 0500 Gross per 24 hour  Intake 300 ml  Output 1773 ml  Net -1473 ml   Filed Weights   01/19/21 0400 01/19/21 1735 01/20/21 0430  Weight: 114.9 kg 115.5 kg 113.8 kg    Examination:   General:  deconditioned and ill looking appeari. Positive pain to touch of right lower extremity  Neurology: Awake and alert, non focal . Somnolent but easy to arouse, no agitation  E ENT: mild pallor, no icterus, oral mucosa moist Cardiovascular: No JVD. S1-S2 present, rhythmic, no gallops, rubs, or murmurs. Right lateral thigh with non pitting tender  edema. Pulmonary:  positive breath sounds bilaterally, adequate air movement, no wheezing, rhonchi or rales. Gastrointestinal.  Abdomen soft and non tender Skin. Erythema right lateral thigh.  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     Data Reviewed: I have personally reviewed following labs and imaging studies  CBC: Recent Labs  Lab 01/17/21 2048 01/17/21 2201 01/18/21 0109 01/18/21 0342 01/18/21 0344 01/19/21 0037 01/20/21 0706  WBC 20.7*  --   --  19.7*  --  19.1* 19.8*  NEUTROABS 17.2*  --   --  16.5*  --  16.9* 17.0*  HGB 8.8*   < > 20.7* 8.6* 9.5* 8.2* 7.9*  HCT 29.0*   < > 61.0* 27.8* 28.0* 26.1* 25.1*  MCV 92.4  --   --  93.3  --  89.7 88.4  PLT 261  --   --  228  --  193 235   < > = values in this interval not displayed.   Basic Metabolic Panel: Recent Labs  Lab 01/17/21 2048 01/17/21 2201 01/18/21 0109 01/18/21 0342 01/18/21 0344 01/18/21 0907 01/19/21 0037 01/20/21 0706  NA 138   < >  138 138 137  --  137 137  K 4.3   < > 4.3 4.4 4.2  --  3.5 3.7  CL 100  --   --  102  --   --  101 96*  CO2 20*  --   --  17*  --   --  22 21*  GLUCOSE 202*  --   --  146*  --   --  140* 189*  BUN 54*  --   --  56*  --   --  32* 21  CREATININE 7.32*  --   --  7.26*  --   --  5.10* 3.72*  CALCIUM 8.8*  --   --  8.8*  --   --  8.3* 8.6*  MG  --   --   --  1.6*  --  1.6* 1.9  --   PHOS  --   --   --  5.3*  --   --  3.8  --    < > = values in this interval not displayed.   GFR: Estimated Creatinine Clearance: 25.4 mL/min (A) (by C-G formula based on SCr of 3.72 mg/dL (H)). Liver Function Tests: Recent Labs  Lab 01/17/21 2048 01/18/21 0342 01/19/21 0037 01/20/21 0706  AST '30 27 28 28  '$ ALT '8 8 8 9  '$ ALKPHOS 88 88 87 107  BILITOT 1.2 1.2 1.4* 1.5*  PROT 6.6 5.8* 6.1* 6.4*  ALBUMIN 2.2* 2.1* 2.5* 2.3*   No results for input(s): LIPASE, AMYLASE in the last 168 hours. Recent Labs  Lab 01/18/21 0200  AMMONIA 61*   Coagulation Profile: Recent Labs  Lab 01/18/21 0342  INR 2.6*   Cardiac Enzymes: No results for input(s): CKTOTAL, CKMB,  CKMBINDEX, TROPONINI in the last 168 hours. BNP (last 3 results) No results for input(s): PROBNP in the last 8760 hours. HbA1C: No results for input(s): HGBA1C in the last 72 hours. CBG: Recent Labs  Lab 01/19/21 2314 01/19/21 2341 01/20/21 0428 01/20/21 0746 01/20/21 1130  GLUCAP 137* 149* 180* 187* 169*   Lipid Profile: No results for input(s): CHOL, HDL, LDLCALC, TRIG, CHOLHDL, LDLDIRECT in the last 72 hours. Thyroid Function Tests: Recent Labs    01/18/21 0200  TSH 9.777*   Anemia Panel: No results for input(s): VITAMINB12, FOLATE, FERRITIN, TIBC, IRON, RETICCTPCT in the last 72 hours.    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

## 2021-01-20 NOTE — Progress Notes (Signed)
Pharmacy Antibiotic Note  Kyle Walton is a 65 y.o. male with ESRD on MWF HD admitted on 01/17/2021 on antibiotics for RLE cellulitis and sepsis coverage. Further workup with CT showed large complex fluid collection concerning for infected hematoma in R vastus lateralis muscle. Pharmacy was consulted for vancomycin dosing (pt was also started on ceftriaxone at that time).  WBC 19.8, afebrile  Pt for I&D, VAC placement today  Plan: Zosyn 2.25 gm IV Q 8 hrs Continue vancomycin 1 gm IV after each HD, MWF Follow HD schedule for any need to re-time vancomycin doses (next HD planned for tomorrow, 7/15) Monitor WBC, temp, clinical improvement, cultures, vancomycin levels as indicated   Height: '5\' 11"'$  (180.3 cm) Weight: 113.8 kg (250 lb 14.1 oz) IBW/kg (Calculated) : 75.3  Temp (24hrs), Avg:98.1 F (36.7 C), Min:97.6 F (36.4 C), Max:98.3 F (36.8 C)  Recent Labs  Lab 01/17/21 2048 01/17/21 2106 01/18/21 0342 01/18/21 0907 01/18/21 2105 01/19/21 0037 01/19/21 0038 01/19/21 0412 01/20/21 0706  WBC 20.7*  --  19.7*  --   --  19.1*  --   --  19.8*  CREATININE 7.32*  --  7.26*  --   --  5.10*  --   --  3.72*  LATICACIDVEN  --  1.2  --  1.6  1.4 1.4  --  1.2 1.1  --      Allergies  Allergen Reactions   Morphine Other (See Comments)    Per son, [name], pt becomes unresponsive/disoriented. Especially when given after HD tx   Liraglutide Diarrhea    Phenol Phenol     Antimicrobials this admission: Vancomycin 7/11 at 2347 >> Ceftriaxone 7/12  >> 7/14 Zosyn 7/14  >>   Microbiology results: 7/11 Bld cx X 2: NGTD 7/11 COVID, flu A, flu B: negative 7/12 HIV screen, HBsAg: negative  Thank you for allowing pharmacy to be a part of this patient's care.  Gillermina Hu, PharmD, BCPS, [facility] Clinical Pharmacist 01/20/2021 3:29 PM

## 2021-01-20 NOTE — Consult Note (Signed)
Reason for Consult:Right thigh abscess Referring Physician: Riccardo Dubin Arrien Time called: [id number] Time at bedside: [place] is an 65 y.o. male.  HPI: Devarious was admitted 2d ago with AMS. He was noted to have what appeared to be cellulitis of the right thigh. CT scan showed a large complex fluid collection concerning for an infected hematoma and orthopedic surgery was consulted. He is not sure how long its been hurting but estimates a few days. He denies any knowledge of trauma. He is, by his own admission, very out of it and cannot contribute much to history.  Past Medical History:  Diagnosis Date   Diabetes mellitus without complication (HCC)    Diastolic heart failure (HCC)    ESRD (end stage renal disease) (HCC)    MI (myocardial infarction) (HCC)    OSA (obstructive sleep apnea)    PAF (paroxysmal atrial fibrillation) ([facility])    Renal disorder    on dialysis    Past Surgical History:  Procedure Laterality Date   CORONARY ARTERY BYPASS GRAFT  2017   LIMA LAD, SVG PDA, OM3   DIALYSIS FISTULA CREATION     TOE AMPUTATION Bilateral     History reviewed. No pertinent family history.  Social History:  reports that he has never smoked. He has never used smokeless tobacco. He reports previous alcohol use. He reports previous drug use.  Allergies:  Allergies  Allergen Reactions   Morphine Other (See Comments)    Per son, [name], pt becomes unresponsive/disoriented. Especially when given after HD tx   Liraglutide Diarrhea    Phenol Phenol     Medications: I have reviewed the patient's current medications.  Results for orders placed or performed during the hospital encounter of 01/17/21 (from the past 48 hour(s))  Glucose, capillary     Status: Abnormal   Collection Time: 01/18/21  6:55 PM  Result Value Ref Range   Glucose-Capillary 151 (H) 70 - 99 mg/dL    Comment: Glucose reference range applies only to samples taken after fasting for at least 8 hours.  Troponin I  (High Sensitivity)     Status: Abnormal   Collection Time: 01/18/21  7:00 PM  Result Value Ref Range   Troponin I (High Sensitivity) 383 (HH) <18 ng/L    Comment: CRITICAL RESULT CALLED TO, READ BACK BY AND VERIFIED WITH: CRITICAL VALUE NOTED.  VALUE IS CONSISTENT WITH PREVIOUSLY REPORTED AND CALLED VALUE. (NOTE) Elevated high sensitivity troponin I (hsTnI) values and significant  changes across serial measurements may suggest ACS but many other  chronic and acute conditions are known to elevate hsTnI results.  Refer to the Links section for chest pain algorithms and additional  guidance. Performed at [facility], [place] [address], [place], [place] [address]   Glucose, capillary     Status: Abnormal   Collection Time: 01/18/21  8:38 PM  Result Value Ref Range   Glucose-Capillary 150 (H) 70 - 99 mg/dL    Comment: Glucose reference range applies only to samples taken after fasting for at least 8 hours.  Blood gas, arterial     Status: Abnormal   Collection Time: 01/18/21  8:59 PM  Result Value Ref Range   FIO2 21.00    pH, Arterial 7.371 7.350 - 7.450   pCO2 arterial 36.5 32.0 - 48.0 mmHg   pO2, Arterial 56.1 (L) 83.0 - 108.0 mmHg   Bicarbonate 20.6 20.0 - 28.0 mmol/L   Acid-base deficit 3.8 (H) 0.0 - 2.0 mmol/L  O2 Saturation 86.9 %   Patient temperature 36.9    Collection site RIGHT RADIAL    Drawn by COLLECTED BY RT     Comment:  N. ADAMS RT   Sample type ARTERIAL DRAW    Allens test (pass/fail) PASS PASS    Comment: Performed at [facility], [place] [address] [place], [place] [address]  Lactic acid, plasma     Status: None   Collection Time: 01/18/21  9:05 PM  Result Value Ref Range   Lactic Acid, Venous 1.4 0.5 - 1.9 mmol/L    Comment: Performed at [facility] [address] [place], [place] [address]  Glucose, capillary     Status: Abnormal   Collection Time: 01/19/21 12:12 AM  Result Value Ref Range   Glucose-Capillary 134 (H) 70 - 99 mg/dL     Comment: Glucose reference range applies only to samples taken after fasting for at least 8 hours.  Comprehensive metabolic panel     Status: Abnormal   Collection Time: 01/19/21 12:37 AM  Result Value Ref Range   Sodium 137 135 - 145 mmol/L   Potassium 3.5 3.5 - 5.1 mmol/L   Chloride 101 98 - 111 mmol/L   CO2 22 22 - 32 mmol/L   Glucose, Bld 140 (H) 70 - 99 mg/dL    Comment: Glucose reference range applies only to samples taken after fasting for at least 8 hours.   BUN 32 (H) 8 - 23 mg/dL   Creatinine, Ser 5.10 (H) 0.61 - 1.24 mg/dL   Calcium 8.3 (L) 8.9 - 10.3 mg/dL   Total Protein 6.1 (L) 6.5 - 8.1 g/dL   Albumin 2.5 (L) 3.5 - 5.0 g/dL   AST 28 15 - 41 U/L   ALT 8 0 - 44 U/L   Alkaline Phosphatase 87 38 - 126 U/L   Total Bilirubin 1.4 (H) 0.3 - 1.2 mg/dL   GFR, Estimated 12 (L) >60 mL/min    Comment: (NOTE) Calculated using the CKD-EPI Creatinine Equation (2021)    Anion gap 14 5 - 15    Comment: Performed at [facility], [place] [address] [place], [place] [address]  Magnesium     Status: None   Collection Time: 01/19/21 12:37 AM  Result Value Ref Range   Magnesium 1.9 1.7 - 2.4 mg/dL    Comment: Performed at  [facility] [address], [place], [place] [address]  Phosphorus     Status: None   Collection Time: 01/19/21 12:37 AM  Result Value Ref Range   Phosphorus 3.8 2.5 - 4.6 mg/dL    Comment: Performed at [facility] [address], [place], [place] [address]  CBC with Differential/Platelet     Status: Abnormal   Collection Time: 01/19/21 12:37 AM  Result Value Ref Range   WBC 19.1 (H) 4.0 - 10.5 K/uL   RBC 2.91 (L) 4.22 - 5.81 MIL/uL   Hemoglobin 8.2 (L) 13.0 - 17.0 g/dL   HCT 26.1 (L) 39.0 - 52.0 %   MCV 89.7 80.0 - 100.0 fL   MCH 28.2 26.0 - 34.0 pg   MCHC 31.4 30.0 - 36.0 g/dL   RDW 18.8 (H) 11.5 - 15.5 %   Platelets 193 150 - 400 K/uL   nRBC 0.0 0.0 - 0.2 %   Neutrophils Relative % 89 %   Neutro Abs 16.9 (H) 1.7 - 7.7 K/uL    Lymphocytes Relative 3 %   Lymphs Abs  0.6 (L) 0.7 - 4.0 K/uL   Monocytes Relative 7 %   Monocytes Absolute 1.3 (H) 0.1 - 1.0 K/uL   Eosinophils Relative 1 %   Eosinophils Absolute 0.1 0.0 - 0.5 K/uL   Basophils Relative 0 %   Basophils Absolute 0.0 0.0 - 0.1 K/uL   Immature Granulocytes 0 %   Abs Immature Granulocytes 0.08 (H) 0.00 - 0.07 K/uL    Comment: Performed at [facility] [address], [place], [place] [address]  Procalcitonin     Status: None   Collection Time: 01/19/21 12:37 AM  Result Value Ref Range   Procalcitonin 1.60 ng/mL    Comment:        Interpretation: PCT > 0.5 ng/mL and <= 2 ng/mL: Systemic infection (sepsis) is possible, but other conditions are known to elevate PCT as well. (NOTE)       Sepsis PCT Algorithm           Lower Respiratory Tract                                      Infection PCT Algorithm    ----------------------------     ----------------------------         PCT < 0.25 ng/mL                PCT < 0.10 ng/mL          Strongly encourage             Strongly discourage   discontinuation of antibiotics    initiation of antibiotics    ----------------------------     -----------------------------       PCT 0.25 - 0.50 ng/mL            PCT 0.10 - 0.25 ng/mL               OR       >80% decrease in PCT            Discourage initiation of                                            antibiotics      Encourage discontinuation           of antibiotics    ----------------------------     -----------------------------         PCT >= 0.50 ng/mL              PCT 0.26 - 0.50 ng/mL                AND       <80% decrease in PCT             Encourage initiation of                                             antibiotics       Encourage continuation           of antibiotics    ----------------------------     -----------------------------        PCT >= 0.50 ng/mL  PCT > 0.50 ng/mL               AND         increase in PCT                   Strongly encourage                                      initiation of antibiotics    Strongly encourage escalation           of antibiotics                                     -----------------------------                                           PCT <= 0.25 ng/mL                                                 OR                                        > 80% decrease in PCT                                      Discontinue / Do not initiate                                             antibiotics  Performed at [facility], [address] [place], [place] [address]   Lactic acid, plasma     Status: None   Collection Time: 01/19/21 12:38 AM  Result Value Ref Range   Lactic Acid, Venous 1.2 0.5 - 1.9 mmol/L    Comment: Performed at [facility] [address] [place], [place] [address]  Glucose, capillary     Status: Abnormal   Collection Time: 01/19/21  3:53 AM  Result Value Ref Range   Glucose-Capillary 137 (H) 70 - 99 mg/dL    Comment: Glucose reference range applies only to samples taken after fasting for at least 8 hours.  Lactic acid, plasma     Status: None   Collection Time: 01/19/21  4:12 AM  Result Value Ref Range   Lactic Acid, Venous 1.1 0.5 - 1.9 mmol/L    Comment: Performed at [facility] [address] [place], [place] [address]  Troponin I (High Sensitivity)     Status: Abnormal   Collection Time: 01/19/21  4:12 AM  Result Value Ref Range   Troponin I (High Sensitivity) 432 (HH) <18 ng/L    Comment: CRITICAL VALUE NOTED.  VALUE IS CONSISTENT WITH PREVIOUSLY REPORTED AND CALLED VALUE. (NOTE) Elevated high sensitivity troponin I (hsTnI) values and significant  changes across serial  measurements may suggest ACS but many other  chronic and acute conditions are known to elevate hsTnI results.  Refer to the Links section for chest pain algorithms and additional  guidance. Performed at [facility], [place] [address] [place],  [place] [address]   Blood gas, arterial     Status: Abnormal   Collection Time: 01/19/21  5:31 AM  Result Value Ref Range   FIO2 40.00    pH, Arterial 7.312 (L) 7.350 - 7.450   pCO2 arterial 44.8 32.0 - 48.0 mmHg   pO2, Arterial 106 83.0 - 108.0 mmHg   Bicarbonate 22.0 20.0 - 28.0 mmol/L   Acid-base deficit 3.2 (H) 0.0 - 2.0 mmol/L   O2 Saturation 97.2 %   Patient temperature 36.9    Collection site RIGHT RADIAL    Drawn by COLLECTED BY RT     Comment:  N. ADAMNS RT    Sample type ARTERIAL DRAW    Allens test (pass/fail) PASS PASS    Comment: Performed at [facility], [place] [address], [place], [place] [address]  Glucose, capillary     Status: Abnormal   Collection Time: 01/19/21  8:15 AM  Result Value Ref Range   Glucose-Capillary 189 (H) 70 - 99 mg/dL    Comment: Glucose reference range applies only to samples taken after fasting for at least 8 hours.  Glucose, capillary     Status: Abnormal   Collection Time: 01/19/21 11:59 AM  Result Value Ref Range   Glucose-Capillary 164 (H) 70 - 99 mg/dL    Comment: Glucose reference range applies only to samples taken after fasting for at least 8 hours.  Glucose, capillary     Status: Abnormal   Collection Time: 01/19/21  3:47 PM  Result Value Ref Range   Glucose-Capillary 180 (H) 70 - 99 mg/dL    Comment: Glucose reference range applies only to samples taken after fasting for at least 8 hours.  Glucose, capillary     Status: Abnormal   Collection Time: 01/19/21 11:14 PM  Result Value Ref Range   Glucose-Capillary 137 (H) 70 - 99 mg/dL    Comment: Glucose reference range applies only to samples taken after fasting for at least 8 hours.  Glucose, capillary     Status: Abnormal   Collection Time: 01/19/21 11:41 PM  Result Value Ref Range   Glucose-Capillary 149 (H) 70 - 99 mg/dL    Comment: Glucose reference range applies only to samples taken after fasting for at least 8 hours.  Glucose, capillary     Status: Abnormal   Collection  Time: 01/20/21  4:28 AM  Result Value Ref Range   Glucose-Capillary 180 (H) 70 - 99 mg/dL    Comment: Glucose reference range applies only to samples taken after fasting for at least 8 hours.  Comprehensive metabolic panel     Status: Abnormal   Collection Time: 01/20/21  7:06 AM  Result Value Ref Range   Sodium 137 135 - 145 mmol/L   Potassium 3.7 3.5 - 5.1 mmol/L   Chloride 96 (L) 98 - 111 mmol/L   CO2 21 (L) 22 - 32 mmol/L   Glucose, Bld 189 (H) 70 - 99 mg/dL    Comment: Glucose reference range applies only to samples taken after fasting for at least 8 hours.   BUN 21 8 - 23 mg/dL   Creatinine, Ser 3.72 (H) 0.61 - 1.24 mg/dL   Calcium 8.6 (L) 8.9 - 10.3 mg/dL  Total Protein 6.4 (L) 6.5 - 8.1 g/dL   Albumin 2.3 (L) 3.5 - 5.0 g/dL   AST 28 15 - 41 U/L   ALT 9 0 - 44 U/L   Alkaline Phosphatase 107 38 - 126 U/L   Total Bilirubin 1.5 (H) 0.3 - 1.2 mg/dL   GFR, Estimated 17 (L) >60 mL/min    Comment: (NOTE) Calculated using the CKD-EPI Creatinine Equation (2021)    Anion gap 20 (H) 5 - 15    Comment: Performed at [facility] [address], [place], [place] [address]  CBC with Differential/Platelet     Status: Abnormal   Collection Time: 01/20/21  7:06 AM  Result Value Ref Range   WBC 19.8 (H) 4.0 - 10.5 K/uL   RBC 2.84 (L) 4.22 - 5.81 MIL/uL   Hemoglobin 7.9 (L) 13.0 - 17.0 g/dL   HCT 25.1 (L) 39.0 - 52.0 %   MCV 88.4 80.0 - 100.0 fL   MCH 27.8 26.0 - 34.0 pg   MCHC 31.5 30.0 - 36.0 g/dL   RDW 18.2 (H) 11.5 - 15.5 %   Platelets 235 150 - 400 K/uL   nRBC 0.0 0.0 - 0.2 %   Neutrophils Relative % 86 %   Neutro Abs 17.0 (H) 1.7 - 7.7 K/uL   Lymphocytes Relative 4 %   Lymphs Abs 0.7 0.7 - 4.0 K/uL   Monocytes Relative 9 %   Monocytes Absolute 1.9 (H) 0.1 - 1.0 K/uL   Eosinophils Relative 0 %   Eosinophils Absolute 0.0 0.0 - 0.5 K/uL   Basophils Relative 0 %   Basophils Absolute 0.1 0.0 - 0.1 K/uL   Immature Granulocytes 1 %   Abs Immature Granulocytes 0.14 (H)  0.00 - 0.07 K/uL    Comment: Performed at [facility] [address] [place], [place] [address]  Glucose, capillary     Status: Abnormal   Collection Time: 01/20/21  7:46 AM  Result Value Ref Range   Glucose-Capillary 187 (H) 70 - 99 mg/dL    Comment: Glucose reference range applies only to samples taken after fasting for at least 8 hours.  Glucose, capillary     Status: Abnormal   Collection Time: 01/20/21 11:30 AM  Result Value Ref Range   Glucose-Capillary 169 (H) 70 - 99 mg/dL    Comment: Glucose reference range applies only to samples taken after fasting for at least 8 hours.    CT FEMUR RIGHT WO CONTRAST  Result Date: 01/20/2021 CLINICAL DATA:  Right thigh swelling, mass. Downtrending hemoglobin. EXAM: CT OF THE LOWER RIGHT EXTREMITY WITHOUT CONTRAST TECHNIQUE: Multidetector CT imaging of the right lower extremity was performed according to the standard protocol. COMPARISON:  Right tibia-fibula CT 01/19/2021 FINDINGS: Bones/Joint/Cartilage Right femur intact without fracture or dislocation. Mild diffuse right hip joint space narrowing. No appreciable right hip joint effusion. Mild medial compartment joint space narrowing of the right knee with small knee joint effusion. No bony erosion or periosteal elevation. Ligaments Suboptimally assessed by CT. Muscles and Tendons Large mixed density collection predominantly located within the right vastus lateralis muscle measuring approximately 29 x 7 x 10 cm (series 10, image 73; series 5, image 91). There are multiple foci of air within the collection. Collection extends from the level of the proximal femoral diaphysis to the level of the distal femoral metaphysis. No additional intramuscular collections. No acute tendinous abnormality is evident. Soft tissues Extensive circumferential subcutaneous edema throughout the right thigh with associated skin  thickening. No foci of gas within the soft tissues at outside of the previously described  collection. Mildly prominent right inguinal lymph nodes are likely reactive. IMPRESSION: 1. Large mixed density collection predominantly located within the right vastus lateralis muscle measuring approximately 29 x 7 x 10 cm with multiple foci of air within the collection. Findings are concerning for hematoma with superimposed infection. 2. Extensive circumferential subcutaneous edema throughout the right thigh with associated skin thickening, suggesting cellulitis. No foci of gas within the soft tissues at outside of the previously described collection. 3. No acute osseous abnormality of the right femur. These results will be called to the ordering clinician or representative by the Radiologist Assistant, and communication documented in the PACS or [employer]. Electronically Signed   By: Davina Poke D.O.   On: 01/20/2021 12:16   CT TIBIA FIBULA RIGHT WO CONTRAST  Result Date: 01/19/2021 CLINICAL DATA:  Right lower leg cellulitis. EXAM: CT OF THE LOWER RIGHT EXTREMITY WITHOUT CONTRAST TECHNIQUE: Multidetector CT imaging of the right lower extremity was performed according to the standard protocol. COMPARISON:  Right tibia and fibula x-rays dated Dec 03, 2020. FINDINGS: Bones/Joint/Cartilage No bony destruction or periosteal reaction. No fracture or dislocation. Mild knee medial compartment joint space in. Small knee joint effusion. Ligaments Ligaments are suboptimally evaluated by CT. Muscles and Tendons Grossly intact. Soft tissue Severe circumferential soft tissue swelling of the idstal thigh and lower leg. Incompletely visualized superficial fluid collection overlying the distal vastus lateralis muscle measuring 4.4 x 2.9 cm (series 4, image 1). No subcutaneous emphysema. Extensive atherosclerotic vascular calcification. IMPRESSION: 1. Severe circumferential soft tissue swelling of the right distal thigh and lower leg, consistent with history of cellulitis. Incompletely visualized superficial fluid  collection overlying the distal vastus lateralis muscle measuring 4.4 x 2.9 cm, concerning for abscess. 2. No acute osseous abnormality. Electronically Signed   By: Titus Dubin M.D.   On: 01/19/2021 08:12   CT FOOT LEFT WO CONTRAST  Result Date: 01/19/2021 CLINICAL DATA:  Dry gangrene of the left second metatarsal. EXAM: CT OF THE LEFT FOOT WITHOUT CONTRAST TECHNIQUE: Multidetector CT imaging of the left foot was performed according to the standard protocol. Multiplanar CT image reconstructions were also generated. COMPARISON:  Left foot x-rays dated May 28, 2020. FINDINGS: Bones/Joint/Cartilage Prior first and second toe amputations. No bony destruction or periosteal reaction. No fracture or dislocation. Mild midfoot osteoarthritis. No joint effusion. Ligaments Ligaments are suboptimally evaluated by CT. Muscles and Tendons Grossly intact. Soft tissue Mild diffuse soft tissue swelling. No subcutaneous emphysema. No fluid collection or hematoma. No soft tissue mass. IMPRESSION: 1. Prior first and second toe amputations. No acute osseous abnormality. 2. Mild diffuse soft tissue swelling. No abscess. Electronically Signed   By: Titus Dubin M.D.   On: 01/19/2021 08:31   CT FOOT RIGHT WO CONTRAST  Result Date: 01/19/2021 CLINICAL DATA:  Right lower leg cellulitis. EXAM: CT OF THE RIGHT FOOT WITHOUT CONTRAST TECHNIQUE: Multidetector CT imaging of the right foot was performed according to the standard protocol. Multiplanar CT image reconstructions were also generated. COMPARISON:  Right foot x-rays dated January 17, 2020. FINDINGS: Bones/Joint/Cartilage Prior transmetatarsal amputation. No bony destruction or periosteal reaction. Chronic deformity of the residual second metatarsal. No fracture or dislocation. Mild mid and hindfoot degenerative changes. No joint effusion. Ligaments Ligaments are suboptimally evaluated by CT. Muscles and Tendons Grossly intact. Soft tissue Mild diffuse soft tissue  swelling. No subcutaneous emphysema. No fluid collection or hematoma. No soft tissue mass.  Probable small pressure lesion at the base the first metatarsal. IMPRESSION: 1. 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 No acute osseous abnormality. 2. Mild diffuse soft tissue swelling. No abscess. Electronically Signed   By: Titus Dubin M.D.   On: 01/19/2021 08:21    Review of Systems  Unable to perform ROS: Mental status change  Musculoskeletal:  Positive for arthralgias (Right thigh, foot).  Blood pressure (!) 110/59, pulse (!) 106, temperature 98.3 F (36.8 C), temperature source Oral, resp. rate 18, height '5\' 11"'$  (1.803 m), weight 113.8 kg, SpO2 95 %. Physical Exam Constitutional:      General: He is not in acute distress.    Appearance: He is well-developed. He is not diaphoretic.  HENT:     Head: Normocephalic and atraumatic.  Eyes:     General: No scleral icterus.       Right eye: No discharge.        Left eye: No discharge.     Conjunctiva/sclera: Conjunctivae normal.  Cardiovascular:     Rate and Rhythm: Normal rate and regular rhythm.  Pulmonary:     Effort: Pulmonary effort is normal. No respiratory distress.  Musculoskeletal:     Cervical back: Normal range of motion.     Comments: RLE No traumatic wounds, ecchymosis, or rash  Severe TTP lateral thigh with induration and erythema  No knee or ankle effusion, able to PROM about 30 degrees without knee pain (significant pain in the thigh though)  Knee stable to varus/ valgus and anterior/posterior stress  Sens DPN, SPN, TN grossly intact  Motor ext, flex, evers 5/5  Foot warm, No significant edema  Skin:    General: Skin is warm and dry.  Neurological:     Mental Status: He is alert.  Psychiatric:        Mood and Affect: Mood normal.        Behavior: Behavior normal.    Assessment/Plan: Right thigh infected hematoma -- Plan I&D, likely VAC placement today by Dr. Stann Mainland.  Please keep NPO. Continue to hold Eliquis. Multiple medical problems including end-stage renal disease on hemodialysis on Monday, Wednesday, Friday schedule, anemia of chronic kidney disease with baseline hemoglobin 9-11, type 2 diabetes mellitus, chronic diastolic heart failure, paroxysmal atrial fibrillation chronically anticoagulated on Eliquis, chronic hypoxic hypercapnic respiratory failure on 2 L continuous nasal cannula as well as nocturnal BiPAP, acquired hypothyroidism -- per primary service    Lisette Abu, PA-C Orthopedic Surgery [phone] 01/20/2021, 2:29 PM

## 2021-01-20 NOTE — Anesthesia Preprocedure Evaluation (Addendum)
Anesthesia Evaluation  Patient identified by MRN, date of birth, ID band Patient confused    Reviewed: Allergy & Precautions, NPO status , Patient's Chart, lab work & pertinent test results  History of Anesthesia Complications Negative for: history of anesthetic complications  Airway Mallampati: III  TM Distance: >3 FB Neck ROM: Full    Dental  (+) Dental Advisory Given   Pulmonary sleep apnea, Continuous Positive Airway Pressure Ventilation and Oxygen sleep apnea ,    breath sounds clear to auscultation       Cardiovascular + CAD, + Past MI and + CABG  + dysrhythmias Atrial Fibrillation + Cardiac Defibrillator  Rhythm:Irregular Rate:Tachycardia  09/17/20: TTE SUMMARY There is mild to moderate global hypokinesis of the left ventricle. LV ejection fraction = 30-35%. Mild left ventricular hypertrophy There is trace aortic regurgitation. There is trace mitral regurgitation. Mild pulmonary hypertension. There is trace tricuspid regurgitation. There is no pericardial effusion. Difficulty to compare with the prior study due to different imagequality, Probably no significant change Image     09/16/20: TTE SUMMARY The left ventricular size is normal. Left ventricular systolic function is severely reduced. LV ejection fraction = 15-20%. Left ventricular filling pattern is prolonged relaxation. Abnormal (paradoxical) septal motion consistent with LBBB. Unable to fully assess LV regional wall motion The right ventricle is moderately dilated. The right ventricular systolic function is moderately reduced. Device lead in the right ventricle The left atrial volume is severely increased. There is aortic valve sclerosis. There is moderate mitral annular calcification. There is mild tricuspid regurgitation. Mild pulmonary hypertension. Estimated right ventricular systolic pressure is 48 mmHg. Dilated IVC consistent with elevated RA pressure. There is no  pericardial effusion. Compared to study [id number] LV systolic function is severely reduced   Neuro/Psych Metabolic encephalopathy. Patient continue somnolent, but reactive, no confusion or agitation. negative psych ROS   GI/Hepatic negative GI ROS, Neg liver ROS,   Endo/Other  diabetesHypothyroidism   Renal/GU ESRF and DialysisRenal diseaseHD 01/19/2021     Musculoskeletal negative musculoskeletal ROS (+)   Abdominal (+) + obese,   Peds  Hematology negative hematology ROS (+) anemia ,   Anesthesia Other Findings   Reproductive/Obstetrics                            Anesthesia Physical Anesthesia Plan  ASA: 4  Anesthesia Plan: General   Post-op Pain Management:    Induction: Intravenous  PONV Risk Score and Plan: 2 and Ondansetron, Dexamethasone and Treatment may vary due to age or medical condition  Airway Management Planned: Oral ETT  Additional Equipment:   Intra-op Plan:   Post-operative Plan: Post-operative intubation/ventilation  Informed Consent: I have reviewed the patients History and Physical, chart, labs and discussed the procedure including the risks, benefits and alternatives for the proposed anesthesia with the patient or authorized representative who has indicated his/her understanding and acceptance.     Dental advisory given and Consent reviewed with POA  Plan Discussed with: Anesthesiologist  Anesthesia Plan Comments: (Discussed with pt's wife via telephone who understands the patient may require post-operative ventilation.  Discussed pt with Dr. Cathlean Sauer regarding pt's mental and respiratory status.  Agrees that patient will need overnight ventilation and will consult CCM for ICU bed.)       Anesthesia Quick Evaluation

## 2021-01-20 NOTE — Brief Op Note (Signed)
01/20/2021  5:30 PM  PATIENT:  Kyle Walton  65 y.o. male  PRE-OPERATIVE DIAGNOSIS:  CELLULITIS  POST-OPERATIVE DIAGNOSIS:  right thigh pyomyositis  PROCEDURE:  Procedure(s): IRRIGATION AND DEBRIDEMENT OF THIGH, APPLICATION OF WOUND VAC (Right)  SURGEON:  Surgeon(s) and Role:    * Nicholes Stairs, MD - Primary  PHYSICIAN ASSISTANT: Jonelle Sidle, PA-C  ANESTHESIA:   none  EBL: 20 cc  BLOOD ADMINISTERED:none  DRAINS: (1) Jackson-Pratt drain(s) with closed bulb suction in the right thigh and incisional wound VAC    LOCAL MEDICATIONS USED:  NONE  SPECIMEN: Right thigh muscle  DISPOSITION OF SPECIMEN:   Micro  COUNTS:  YES  TOURNIQUET:  * Missing tourniquet times found for documented tourniquets in log: [id number] *  DICTATION: .Note written in EPIC  PLAN OF CARE: Admit to inpatient   PATIENT DISPOSITION:  ICU - intubated and critically ill.   Delay start of Pharmacological VTE agent (>24hrs) due to surgical blood loss or risk of bleeding: not applicable

## 2021-01-20 NOTE — Progress Notes (Signed)
[facility] Progress Note Patient Name: Kyle Walton DOB: 04/21/1956 MRN: [id number]   Date of Service  01/20/2021  HPI/Events of Note  Patient needs OG tube for medication access.   eICU Interventions  OG tube ordered + KUB to verify placement.        Kerry Kass Hady Niemczyk 01/20/2021, 10:58 PM

## 2021-01-20 NOTE — Progress Notes (Signed)
RT pull ETT out 1cm per MD verbal order. ETT was 25'@lips'$  ETT is now 24'@lips'$ . RT will continue to monitor.

## 2021-01-20 NOTE — Op Note (Signed)
Date of Surgery: 01/20/2021  INDICATIONS: Kyle Walton is a 65 y.o.-year-old male with a right intramuscular thigh abscess.  He has multiple medical comorbidities and presented after increasing confusion and lethargy over the course of a number of days.  He has been admitted for the last 2 days and a CT scan revealed a large intramuscular abscess of the right lateral thigh.;  The family and Patient did consent to the procedure after discussion of the risks and benefits.  PREOPERATIVE DIAGNOSIS:  Right thigh pyomyositis  POSTOPERATIVE DIAGNOSIS: Same.  PROCEDURE:  Excisional debridement and irrigation for right intramuscular thigh abscess Placement of negative pressure wound assisted closure device  SURGEON: Geralynn Rile, M.D.  ASSIST: Jonelle Sidle, PA-C  Assistant attestation:  PA Mcclung was present for the entire procedure.  Precipitate in all critical portions..  ANESTHESIA:  general  IV FLUIDS AND URINE: See anesthesia.  ESTIMATED BLOOD LOSS: 20 mL.  IMPLANTS: None  DRAINS: 1 Jackson-Pratt drain sewn in 1 wound VAC drain for incisional VAC.  COMPLICATIONS: None.  DESCRIPTION OF PROCEDURE: The patient was brought to the operating room and placed supine on the operating table.  The patient had been signed prior to the procedure and this was documented. The patient had the anesthesia placed by the anesthesiologist.  A time-out was performed to confirm that this was the correct patient, site, side and location. The patient did receive antibiotics prior to the incision and was re-dosed during the procedure as needed at indicated intervals.  A tourniquet was not placed.  The patient had the operative extremity prepped and draped in the standard surgical fashion.     We began the procedure with a longitudinal incision along the lateral thigh at the area of induration and fluctuance.  Dissection was carried down through subcutaneous tissue we encountered gross purulence.  We evacuated  approximately 600 cc of purulent material.  The iliotibial band had completely been denuded and was no longer a formal structure due to the chronicity of this infection.  Debridement type: Excisional Debridement  Side: right  Body Location: thigh muscle and skin and subcutaneous tissue   Tools used for debridement: scalpel, scissors, and rongeur  Pre-debridement Wound size (cm):   Length: 10        Width: 1     Depth: 5   Post-debridement Wound size (cm):   Length: 10        Width: 5     Depth: 6   Debridement depth beyond dead/damaged tissue down to healthy viable tissue: yes  Tissue layer involved: skin, subcutaneous tissue, muscle / fascia  Nature of tissue removed: Slough, Necrotic, Devitalized Tissue, and Purulence  Irrigation volume: 6 L      Irrigation fluid type: Normal Saline  Following the debridement and irrigation we then placed a Jackson-Pratt drain percutaneously along the length of the infectious bed.  This was placed in a closed suction bulb.  Next, we applied a negative pressure dressing across the incision.  The incision was closed with 2-0 interrupted nylon sutures.  Adaptic was applied, and then a wound VAC sponge.  This was then covered with occlusive dressing seal.  The suction pad was placed on this after preparation.  This was attached to the wound VAC device at 125 mm of negative pressure.  Suction seal was good.  The leg was cleaned and dried.  The patient was remained intubated due to his critically ill state.  He was transported directly to PACU.  Care was transferred  over to the critical care team.    POSTOPERATIVE PLAN:  Intraoperative cultures were obtained.  These were followed by the primary service.  We will monitor his drain output and discontinue once it has put out less than 30 cc over the course of the 12-hour shift.  Maintain incisional VAC as well for likely 5 to 6 days.  He may need a second look if he continues to deteriorate or if his exam  is concerning.  Critical care per the primary team.

## 2021-01-20 NOTE — Anesthesia Postprocedure Evaluation (Signed)
Anesthesia Post Note  Patient: Kyle Walton  Procedure(s) Performed: IRRIGATION AND DEBRIDEMENT OF THIGH, APPLICATION OF WOUND VAC (Right: Leg Upper)     Patient location during evaluation: SICU Anesthesia Type: General Level of consciousness: sedated Pain management: pain level controlled Vital Signs Assessment: post-procedure vital signs reviewed and stable Respiratory status: patient remains intubated per anesthesia plan Cardiovascular status: stable Postop Assessment: no apparent nausea or vomiting Anesthetic complications: no   No notable events documented.  Last Vitals:  Vitals:   01/20/21 1845 01/20/21 1900  BP: 119/63 111/64  Pulse: 96 95  Resp: (!) 22 (!) 21  Temp:    SpO2: 100% 100%    Last Pain:  Vitals:   01/20/21 1751  TempSrc: Axillary  PainSc:                  Daichi Moris DANIEL

## 2021-01-20 NOTE — Progress Notes (Signed)
[facility] Progress Note   Subjective:   Patient seen and examined at bedside in room.  Reports pain in thigh greatly improved today.  Denies CP, SOB, n/v/d, abdominal pain, weakness and fatigue. Tolerated dialysis well last night  Objective Vitals:   01/20/21 0430 01/20/21 0741 01/20/21 1030 01/20/21 1132  BP: (!) 112/59 (!) 106/55 135/64   Pulse: 100 100 (!) 103   Resp: '17 19 20   '$ Temp: 98.2 F (36.8 C) 98.3 F (36.8 C)    TempSrc: Oral Oral  Oral  SpO2: 95% 92% 95%   Weight: 113.8 kg     Height:       Physical Exam General:chronically ill appearing male in NAD Heart:IRIR, no mrg Lungs:CTAB, nml WOB on RA Abdomen:soft, NTND Extremities:1+ edema b/l thighs r>L, multiple areas of erythema on b/l legs but improving, R thigh with large lateral area of erythema, +tenderness Dialysis Access: [facility]   Filed Weights   01/19/21 0400 01/19/21 1735 01/20/21 0430  Weight: 114.9 kg 115.5 kg 113.8 kg    Intake/Output Summary (Last 24 hours) at 01/20/2021 1243 Last data filed at 01/20/2021 0500 Gross per 24 hour  Intake 300 ml  Output 1773 ml  Net -1473 ml    Additional Objective Labs: Basic Metabolic Panel: Recent Labs  Lab 01/18/21 0342 01/18/21 0344 01/19/21 0037 01/20/21 0706  NA 138 137 137 137  K 4.4 4.2 3.5 3.7  CL 102  --  101 96*  CO2 17*  --  22 21*  GLUCOSE 146*  --  140* 189*  BUN 56*  --  32* 21  CREATININE 7.26*  --  5.10* 3.72*  CALCIUM 8.8*  --  8.3* 8.6*  PHOS 5.3*  --  3.8  --    Liver Function Tests: Recent Labs  Lab 01/18/21 0342 01/19/21 0037 01/20/21 0706  AST '27 28 28  '$ ALT '8 8 9  '$ ALKPHOS 88 87 107  BILITOT 1.2 1.4* 1.5*  PROT 5.8* 6.1* 6.4*  ALBUMIN 2.1* 2.5* 2.3*   CBC: Recent Labs  Lab 01/17/21 2048 01/17/21 2201 01/18/21 0342 01/18/21 0344 01/19/21 0037 01/20/21 0706  WBC 20.7*  --  19.7*  --  19.1* 19.8*  NEUTROABS 17.2*  --  16.5*  --  16.9* 17.0*  HGB 8.8*   < > 8.6* 9.5* 8.2* 7.9*  HCT 29.0*   < > 27.8*  28.0* 26.1* 25.1*  MCV 92.4  --  93.3  --  89.7 88.4  PLT 261  --  228  --  193 235   < > = values in this interval not displayed.   Blood Culture    Component Value Date/Time   SDES BLOOD SITE NOT SPECIFIED 01/17/2021 2140   SPECREQUEST  01/17/2021 2140    BOTTLES DRAWN AEROBIC AND ANAEROBIC Blood Culture adequate volume   CULT  01/17/2021 2140    NO GROWTH 3 DAYS Performed at [facility], [place] [address] [place], [place] [address]    REPTSTATUS PENDING 01/17/2021 2140    CBG: Recent Labs  Lab 01/19/21 2314 01/19/21 2341 01/20/21 0428 01/20/21 0746 01/20/21 1130  GLUCAP 137* 149* 180* 187* 169*    Studies/Results: CT FEMUR RIGHT WO CONTRAST  Result Date: 01/20/2021 CLINICAL DATA:  Right thigh swelling, mass. Downtrending hemoglobin. EXAM: CT OF THE LOWER RIGHT EXTREMITY WITHOUT CONTRAST TECHNIQUE: Multidetector CT imaging of the right lower extremity was performed according to the standard protocol. COMPARISON:  Right tibia-fibula CT 01/19/2021 FINDINGS: Bones/Joint/Cartilage Right femur  intact without fracture or dislocation. Mild diffuse right hip joint space narrowing. No appreciable right hip joint effusion. Mild medial compartment joint space narrowing of the right knee with small knee joint effusion. No bony erosion or periosteal elevation. Ligaments Suboptimally assessed by CT. Muscles and Tendons Large mixed density collection predominantly located within the right vastus lateralis muscle measuring approximately 29 x 7 x 10 cm (series 10, image 73; series 5, image 91). There are multiple foci of air within the collection. Collection extends from the level of the proximal femoral diaphysis to the level of the distal femoral metaphysis. No additional intramuscular collections. No acute tendinous abnormality is evident. Soft tissues Extensive circumferential subcutaneous edema throughout the right thigh with associated skin thickening. No foci of gas within the soft  tissues at outside of the previously described collection. Mildly prominent right inguinal lymph nodes are likely reactive. IMPRESSION: 1. Large mixed density collection predominantly located within the right vastus lateralis muscle measuring approximately 29 x 7 x 10 cm with multiple foci of air within the collection. Findings are concerning for hematoma with superimposed infection. 2. Extensive circumferential subcutaneous edema throughout the right thigh with associated skin thickening, suggesting cellulitis. No foci of gas within the soft tissues at outside of the previously described collection. 3. No acute osseous abnormality of the right femur. These results will be called to the ordering clinician or representative by the Radiologist Assistant, and communication documented in the PACS or [employer]. Electronically Signed   By: Davina Poke D.O.   On: 01/20/2021 12:16   CT TIBIA FIBULA RIGHT WO CONTRAST  Result Date: 01/19/2021 CLINICAL DATA:  Right lower leg cellulitis. EXAM: CT OF THE LOWER RIGHT EXTREMITY WITHOUT CONTRAST TECHNIQUE: Multidetector CT imaging of the right lower extremity was performed according to the standard protocol. COMPARISON:  Right tibia and fibula x-rays dated Dec 03, 2020. FINDINGS: Bones/Joint/Cartilage No bony destruction or periosteal reaction. No fracture or dislocation. Mild knee medial compartment joint space in. Small knee joint effusion. Ligaments Ligaments are suboptimally evaluated by CT. Muscles and Tendons Grossly intact. Soft tissue Severe circumferential soft tissue swelling of the idstal thigh and lower leg. Incompletely visualized superficial fluid collection overlying the distal vastus lateralis muscle measuring 4.4 x 2.9 cm (series 4, image 1). No subcutaneous emphysema. Extensive atherosclerotic vascular calcification. IMPRESSION: 1. Severe circumferential soft tissue swelling of the right distal thigh and lower leg, consistent with history of  cellulitis. Incompletely visualized superficial fluid collection overlying the distal vastus lateralis muscle measuring 4.4 x 2.9 cm, concerning for abscess. 2. No acute osseous abnormality. Electronically Signed   By: Titus Dubin M.D.   On: 01/19/2021 08:12   CT FOOT LEFT WO CONTRAST  Result Date: 01/19/2021 CLINICAL DATA:  Dry gangrene of the left second metatarsal. EXAM: CT OF THE LEFT FOOT WITHOUT CONTRAST TECHNIQUE: Multidetector CT imaging of the left foot was performed according to the standard protocol. Multiplanar CT image reconstructions were also generated. COMPARISON:  Left foot x-rays dated May 28, 2020. FINDINGS: Bones/Joint/Cartilage Prior first and second toe amputations. No bony destruction or periosteal reaction. No fracture or dislocation. Mild midfoot osteoarthritis. No joint effusion. Ligaments Ligaments are suboptimally evaluated by CT. Muscles and Tendons Grossly intact. Soft tissue Mild diffuse soft tissue swelling. No subcutaneous emphysema. No fluid collection or hematoma. No soft tissue mass. IMPRESSION: 1. Prior first and second toe amputations. No acute osseous abnormality. 2. Mild diffuse soft tissue swelling. No abscess. Electronically Signed   By: Orville Govern.D.  On: 01/19/2021 08:31   CT FOOT RIGHT WO CONTRAST  Result Date: 01/19/2021 CLINICAL DATA:  Right lower leg cellulitis. EXAM: CT OF THE RIGHT FOOT WITHOUT CONTRAST TECHNIQUE: Multidetector CT imaging of the right foot was performed according to the standard protocol. Multiplanar CT image reconstructions were also generated. COMPARISON:  Right foot x-rays dated January 17, 2020. FINDINGS: Bones/Joint/Cartilage Prior transmetatarsal amputation. No bony destruction or periosteal reaction. Chronic deformity of the residual second metatarsal. No fracture or dislocation. Mild mid and hindfoot degenerative changes. No joint effusion. Ligaments Ligaments are suboptimally evaluated by CT. Muscles and Tendons Grossly  intact. Soft tissue Mild diffuse soft tissue swelling. No subcutaneous emphysema. No fluid collection or hematoma. No soft tissue mass. Probable small pressure lesion at the base the first metatarsal. IMPRESSION: 1. 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 No acute osseous abnormality. 2. Mild diffuse soft tissue swelling. No abscess. Electronically Signed   By: Titus Dubin M.D.   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 (01/19/21 2033)   vancomycin 1,000 mg (01/20/21 0005)    apixaban  5 mg Oral BID   atorvastatin  40 mg Oral QPM   calcium acetate  1,334 mg Oral TID WC   Chlorhexidine Gluconate Cloth  6 each Topical Q0600   clopidogrel  75 mg Oral q AM   Gerhardt's butt cream   Topical BID   insulin aspart  0-6 Units Subcutaneous Q4H   levothyroxine  50 mcg Oral Q0600   metoprolol succinate  12.5 mg Oral Daily   midodrine  10 mg Oral TID WC    Dialysis Orders: MWF at [facility] 4hr, 450/800, EDW 117kg, 3K/2.5Ca, TDC, heparin 4900 bolus + 500/hr    Assessment/Plan:  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  CT of tib/fib on right concerning for abscess.  CT R femur today concerning for R thigh cellulitis and large hematoma with superimposed infection within right vastus lateralis muscle. On vanc and ceftriaxone. Per PMD Acute encephalopathy - Improved. Multifactorial etiology. Acute on chronic hypercapnic respiratory failure - Improved. CXR with volume overload on admit. UF total 5L over last 2 days.  Now on RA.  ESRD -  on HD MWF. Next HD tomorrow.  Volume overload - Edema improving.  Net UF 5L over last 2 days.  Under edw if weights correct.  Continue to titrate down volume as tolerated.    Hypertension - chronic hypotension on midodrine.    Anemia of CKD - Hgb 8.2.  Will order aranesp with HD  tomorrow.   Secondary Hyperparathyroidism -  Ca and phos in goal.  Continue binders. No VDRA noted.  Nutrition - Renal diet w/fluid restrictions. Diastolic HF - volume control with HD 11. DMT2 12. PAF - per chart plan for cardioversion later this month   Jen Mow, PA-C [place] 01/20/2021,12:43 PM  LOS: 2 days

## 2021-01-20 NOTE — Transfer of Care (Signed)
Immediate Anesthesia Transfer of Care Note  Patient: Kyle Walton  Procedure(s) Performed: IRRIGATION AND DEBRIDEMENT OF THIGH, APPLICATION OF WOUND VAC (Right: Leg Upper)  Patient Location: ICU  Anesthesia Type:General  Level of Consciousness: Patient remains intubated per anesthesia plan  Airway & Oxygen Therapy: Patient remains intubated per anesthesia plan and Patient placed on Ventilator (see vital sign flow sheet for setting)  Post-op Assessment: Report given to RN and Post -op Vital signs reviewed and stable  Post vital signs: Reviewed and stable  Last Vitals:  Vitals Value Taken Time  BP 108/63 01/20/21 1800  Temp    Pulse 93 01/20/21 1803  Resp 14 01/20/21 1803  SpO2 100 % 01/20/21 1803  Vitals shown include unvalidated device data.  Last Pain:  Vitals:   01/20/21 1541  TempSrc:   PainSc: 5          Complications: No notable events documented.

## 2021-01-20 NOTE — Anesthesia Procedure Notes (Signed)
Procedure Name: Intubation Date/Time: 01/20/2021 4:31 PM Performed by: Alain Marion, CRNA Pre-anesthesia Checklist: Patient identified, Emergency Drugs available, Suction available and Patient being monitored Patient Re-evaluated:Patient Re-evaluated prior to induction Oxygen Delivery Method: Circle System Utilized Preoxygenation: Pre-oxygenation with 100% oxygen Induction Type: IV induction Ventilation: Mask ventilation without difficulty and Oral airway inserted - appropriate to patient size Laryngoscope Size: Glidescope and 3 Grade View: Grade I Tube type: Oral Tube size: 7.5 mm Number of attempts: 1 Airway Equipment and Method: Stylet, Oral airway and Video-laryngoscopy Placement Confirmation: ETT inserted through vocal cords under direct vision, positive ETCO2 and breath sounds checked- equal and bilateral Secured at: 23 cm Tube secured with: Tape Dental Injury: Teeth and Oropharynx as per pre-operative assessment  Comments: Elective glide use

## 2021-01-20 NOTE — Progress Notes (Signed)
NAME:  Temur Schu, MRN:  [id number], DOB:  03/05/1956, LOS: 2 ADMISSION DATE:  01/17/2021, CONSULTATION DATE:  01/18/2021 REFERRING MD:  Dr. Alvino Chapel, CHIEF COMPLAINT:  Hypotension    History of Present Illness:  65 year old male presents to ED on 7/11 with right leg pain and swelling. Recent hospitalization 5/27-6/24 for RLE cellulitis. On arrival to ED patient with hypoxia requiring 2L [place] and lethargic. ABG 7.195/52.4/60. Placed on BiPAP. CXR with central vascular congestion. WBC 20.7. Lactic Acid 1.2. Troponin 420. Crt 7.32. Given Cefepime/Vancomycin. BP on arrival 114/49 however during stay dropped to 89/45. Critical Care consulted.   On assessment patient is alert, states his BP normally runs 123456 systolic with 10 mg midodrine. Reports has not taken afternoon dose today.  Currently BP 92/55  Pertinent  Medical History  CAD/CABG, Diastolic HF, PAF/Flutter s/p AICD on Eliquis, DM, PVD s/p toe amputations, ESRD HD MWF, OSA on CPAP at HS, Chronic hypotension on Midodrine   Significant Hospital Events: Including procedures, antibiotic start and stop dates in addition to other pertinent events   7/12 admitted with encephalopathy and sepsis, started on vanc, ceftriaxone. HD on 7/12. 7/14> to OR for debridement of thigh hematoma> to ICU after intubated. Switched to vanc + zosyn  Interim History / Subjective:  Intubated for surgery, no plans to extubate after due to encephalopathy.  Objective   Blood pressure (!) 131/58, pulse (!) 113, temperature 98.2 F (36.8 C), temperature source Oral, resp. rate 18, height '5\' 11"'$  (1.803 m), weight 113.8 kg, SpO2 94 %.        Intake/Output Summary (Last 24 hours) at 01/20/2021 1707 Last data filed at 01/20/2021 1702 Gross per 24 hour  Intake 550 ml  Output 1773 ml  Net -1223 ml    Filed Weights   01/19/21 0400 01/19/21 1735 01/20/21 0430  Weight: 114.9 kg 115.5 kg 113.8 kg    Examination: General: no acute distress,  intubated/sedated HENT: [place]/AT, moist mucous membranes, sclera anicteric, PERRL Lungs: clear to auscultation bilaterally. No wheezing Cardiovascular: irregularly irregular. No murmurs. Abdomen: soft, non-distended, bowel sounds present  Extremities: warm, no edema, right thigh wound dressing and JP drain in place - serosanguinous fluid   Neuro: sedated  GU: deferred  BUN 21 Cr 3.72 T. Bili 1.5 WBC 19.8 H/H 7.9/25.1 Blood cultures 7/11: NGTD   Resolved Hospital Problem list     Assessment & Plan:   Acute respiratory failure with hypoxia and hypercapnia, requiring MV post-operatively -LTVV, 4-8cc/kg IBW with goal Pplat<30 and DP<15. -SAT & SBT daily as appropriate. -VAP prevention protocol. -PAD protocol for sedation; goal RASS 0 to -1. -ABG & CXR after arriving in ICU.  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 A1c >9 -SSI PRN -goal BG 140-180  Hypothyroidism -con't PTA synthroid   Best Practice (right click and "Reselect all SmartList Selections" daily)   Diet/type: NPO DVT prophylaxis: SCD GI prophylaxis: PPI Lines: Dialysis Catheter Foley:  N/A Code Status:  full code Last date of multidisciplinary goals of care discussion [ N/A]  Labs   CBC: Recent Labs  Lab  01/17/21 2048 01/17/21 2201 01/18/21 0109 01/18/21 0342 01/18/21 0344 01/19/21 0037 01/20/21 0706  WBC 20.7*  --   --  19.7*  --  19.1* 19.8*  NEUTROABS 17.2*  --   --  16.5*  --  16.9* 17.0*  HGB  8.8*   < > 20.7* 8.6* 9.5* 8.2* 7.9*  HCT 29.0*   < > 61.0* 27.8* 28.0* 26.1* 25.1*  MCV 92.4  --   --  93.3  --  89.7 88.4  PLT 261  --   --  228  --  193 235   < > = values in this interval not displayed.     Basic Metabolic Panel: Recent Labs  Lab 01/17/21 2048 01/17/21 2201 01/18/21 0109 01/18/21 0342 01/18/21 0344 01/18/21 0907 01/19/21 0037 01/20/21 0706  NA 138   < > 138 138 137  --  137 137  K 4.3   < > 4.3 4.4 4.2  --  3.5 3.7  CL 100  --   --  102  --   --  101 96*  CO2 20*  --   --  17*  --   --  22 21*  GLUCOSE 202*  --   --  146*  --   --  140* 189*  BUN 54*  --   --  56*  --   --  32* 21  CREATININE 7.32*  --   --  7.26*  --   --  5.10* 3.72*  CALCIUM 8.8*  --   --  8.8*  --   --  8.3* 8.6*  MG  --   --   --  1.6*  --  1.6* 1.9  --   PHOS  --   --   --  5.3*  --   --  3.8  --    < > = values in this interval not displayed.    GFR: Estimated Creatinine Clearance: 25.4 mL/min (A) (by C-G formula based on SCr of 3.72 mg/dL (H)). Recent Labs  Lab 01/17/21 2048 01/17/21 2106 01/18/21 0342 01/18/21 0907 01/18/21 2105 01/19/21 0037 01/19/21 0038 01/19/21 0412 01/20/21 0706  PROCALCITON  --   --  1.24 1.30  --  1.60  --   --   --   WBC 20.7*  --  19.7*  --   --  19.1*  --   --  19.8*  LATICACIDVEN  --    < >  --  1.6  1.4 1.4  --  1.2 1.1  --    < > = values in this interval not displayed.     Liver Function Tests: Recent Labs  Lab 01/17/21 2048 01/18/21 0342 01/19/21 0037 01/20/21 0706  AST '30 27 28 28  '$ ALT '8 8 8 9  '$ ALKPHOS 88 88 87 107  BILITOT 1.2 1.2 1.4* 1.5*  PROT 6.6 5.8* 6.1* 6.4*  ALBUMIN 2.2* 2.1* 2.5* 2.3*    No results for input(s): LIPASE, AMYLASE in the last 168 hours. Recent Labs  Lab 01/18/21 0200  AMMONIA 61*     This patient is critically ill with multiple organ system failure which requires frequent high complexity decision making, assessment, support, evaluation, and titration of therapies. This was completed  through the application of advanced monitoring technologies and extensive interpretation of multiple databases. During this encounter critical care time was devoted to patient care services described in this note for 40 minutes.  Freda Jackson, MD [facility]  Office: [phone]   See Amion for personal pager PCCM on call pager [phone] until 7pm. Please call Elink 7p-7a. [phone]

## 2021-01-20 NOTE — Anesthesia Postprocedure Evaluation (Deleted)
Anesthesia Post Note  Patient: Kyle Walton  Procedure(s) Performed: IRRIGATION AND DEBRIDEMENT OF THIGH, APPLICATION OF WOUND VAC (Right: Leg Upper)     Patient location during evaluation: SICU Anesthesia Type: General Level of consciousness: sedated Pain management: pain level controlled Vital Signs Assessment: post-procedure vital signs reviewed and stable Respiratory status: patient remains intubated per anesthesia plan Cardiovascular status: stable Postop Assessment: no apparent nausea or vomiting Anesthetic complications: no   No notable events documented.  Last Vitals:  Vitals:   01/20/21 1519 01/20/21 1751  BP: (!) 131/58   Pulse: (!) 113   Resp: 18   Temp: 36.8 C   SpO2: 94% 100%    Last Pain:  Vitals:   01/20/21 1541  TempSrc:   PainSc: 5                  Kyle Walton DANIEL

## 2021-01-21 ENCOUNTER — Encounter (HOSPITAL_COMMUNITY): Payer: Self-pay | Admitting: Orthopedic Surgery

## 2021-01-21 DIAGNOSIS — J9622 Acute and chronic respiratory failure with hypercapnia: Secondary | ICD-10-CM | POA: Diagnosis not present

## 2021-01-21 DIAGNOSIS — G9340 Encephalopathy, unspecified: Secondary | ICD-10-CM | POA: Diagnosis not present

## 2021-01-21 DIAGNOSIS — N186 End stage renal disease: Secondary | ICD-10-CM | POA: Diagnosis not present

## 2021-01-21 DIAGNOSIS — J9621 Acute and chronic respiratory failure with hypoxia: Secondary | ICD-10-CM | POA: Diagnosis not present

## 2021-01-21 LAB — COMPREHENSIVE METABOLIC PANEL
ALT: 6 U/L (ref 0–44)
AST: 29 U/L (ref 15–41)
Albumin: 2.2 g/dL — ABNORMAL LOW (ref 3.5–5.0)
Alkaline Phosphatase: 104 U/L (ref 38–126)
Anion gap: 23 — ABNORMAL HIGH (ref 5–15)
BUN: 30 mg/dL — ABNORMAL HIGH (ref 8–23)
CO2: 17 mmol/L — ABNORMAL LOW (ref 22–32)
Calcium: 8.7 mg/dL — ABNORMAL LOW (ref 8.9–10.3)
Chloride: 96 mmol/L — ABNORMAL LOW (ref 98–111)
Creatinine, Ser: 4.67 mg/dL — ABNORMAL HIGH (ref 0.61–1.24)
GFR, Estimated: 13 mL/min — ABNORMAL LOW (ref >60)
Glucose, Bld: 149 mg/dL — ABNORMAL HIGH (ref 70–99)
Potassium: 3.9 mmol/L (ref 3.5–5.1)
Sodium: 136 mmol/L (ref 135–145)
Total Bilirubin: 2 mg/dL — ABNORMAL HIGH (ref 0.3–1.2)
Total Protein: 5.7 g/dL — ABNORMAL LOW (ref 6.5–8.1)

## 2021-01-21 LAB — CBC WITH DIFFERENTIAL/PLATELET
Abs Immature Granulocytes: 0.16 10*3/uL — ABNORMAL HIGH (ref 0.00–0.07)
Basophils Absolute: 0 10*3/uL (ref 0.0–0.1)
Basophils Relative: 0 %
Eosinophils Absolute: 0 10*3/uL (ref 0.0–0.5)
Eosinophils Relative: 0 %
HCT: 25.4 % — ABNORMAL LOW (ref 39.0–52.0)
Hemoglobin: 8 g/dL — ABNORMAL LOW (ref 13.0–17.0)
Immature Granulocytes: 1 %
Lymphocytes Relative: 4 %
Lymphs Abs: 1 10*3/uL (ref 0.7–4.0)
MCH: 28 pg (ref 26.0–34.0)
MCHC: 31.5 g/dL (ref 30.0–36.0)
MCV: 88.8 fL (ref 80.0–100.0)
Monocytes Absolute: 1.8 10*3/uL — ABNORMAL HIGH (ref 0.1–1.0)
Monocytes Relative: 8 %
Neutro Abs: 19.4 10*3/uL — ABNORMAL HIGH (ref 1.7–7.7)
Neutrophils Relative %: 87 %
Platelets: 239 10*3/uL (ref 150–400)
RBC: 2.86 MIL/uL — ABNORMAL LOW (ref 4.22–5.81)
RDW: 18.7 % — ABNORMAL HIGH (ref 11.5–15.5)
WBC: 22.3 10*3/uL — ABNORMAL HIGH (ref 4.0–10.5)
nRBC: 0 % (ref 0.0–0.2)

## 2021-01-21 LAB — GLUCOSE, CAPILLARY
Glucose-Capillary: 123 mg/dL — ABNORMAL HIGH (ref 70–99)
Glucose-Capillary: 126 mg/dL — ABNORMAL HIGH (ref 70–99)
Glucose-Capillary: 129 mg/dL — ABNORMAL HIGH (ref 70–99)
Glucose-Capillary: 140 mg/dL — ABNORMAL HIGH (ref 70–99)
Glucose-Capillary: 141 mg/dL — ABNORMAL HIGH (ref 70–99)
Glucose-Capillary: 166 mg/dL — ABNORMAL HIGH (ref 70–99)

## 2021-01-21 LAB — TRIGLYCERIDES: Triglycerides: 163 mg/dL — ABNORMAL HIGH (ref <150)

## 2021-01-21 MED ORDER — HYDROCODONE-ACETAMINOPHEN 5-325 MG PO TABS: 1.0000 | ORAL_TABLET | Freq: Four times a day (QID) | ORAL | Status: DC | PRN

## 2021-01-21 MED ORDER — ALBUMIN HUMAN 25 % IV SOLN
25.0000 g | Freq: Once | INTRAVENOUS | Status: AC
  Administered 2021-01-21: 25 g via INTRAVENOUS
  Filled 2021-01-21: qty 100

## 2021-01-21 MED ORDER — LEVOTHYROXINE SODIUM 50 MCG PO TABS
50.0000 ug | ORAL_TABLET | Freq: Every day | ORAL | Status: DC
  Administered 2021-01-21: 50 ug
  Filled 2021-01-21 (×2): qty 1

## 2021-01-21 MED ORDER — VANCOMYCIN HCL 1.25 G IV SOLR
1250.0000 mg | INTRAVENOUS | Status: AC
  Administered 2021-01-21: 1250 mg via INTRAVENOUS
  Filled 2021-01-21 (×2): qty 1250

## 2021-01-21 MED ORDER — VANCOMYCIN HCL IN DEXTROSE 1-5 GM/200ML-% IV SOLN
1000.0000 mg | INTRAVENOUS | Status: DC
  Filled 2021-01-21: qty 200

## 2021-01-21 MED ORDER — CALCIUM ACETATE (PHOS BINDER) 667 MG PO CAPS
1334.0000 mg | ORAL_CAPSULE | Freq: Three times a day (TID) | ORAL | Status: DC
  Administered 2021-01-22 – 2021-01-28 (×19): 1334 mg via ORAL
  Filled 2021-01-21 (×19): qty 2

## 2021-01-21 MED ORDER — ORAL CARE MOUTH RINSE
15.0000 mL | OROMUCOSAL | Status: DC
  Administered 2021-01-21 (×5): 15 mL via OROMUCOSAL

## 2021-01-21 MED ORDER — ALBUMIN HUMAN 5 % IV SOLN
INTRAVENOUS | Status: AC
  Filled 2021-01-21: qty 500

## 2021-01-21 MED ORDER — OXYCODONE HCL 5 MG/5ML PO SOLN
5.0000 mg | Freq: Four times a day (QID) | ORAL | Status: DC | PRN
  Administered 2021-01-24 – 2021-01-26 (×4): 5 mg via ORAL
  Filled 2021-01-21 (×4): qty 5

## 2021-01-21 MED ORDER — CHLORHEXIDINE GLUCONATE 0.12% ORAL RINSE (MEDLINE KIT)
15.0000 mL | Freq: Two times a day (BID) | OROMUCOSAL | Status: DC
  Administered 2021-01-21 (×2): 15 mL via OROMUCOSAL

## 2021-01-21 MED ORDER — VANCOMYCIN HCL 10 G IV SOLR
1250.0000 mg | INTRAVENOUS | Status: DC
  Filled 2021-01-21: qty 1250

## 2021-01-21 NOTE — Progress Notes (Signed)
[facility] Progress Note   Subjective:   Patient seen and examined at bedside in room.  No new c/o's.   Objective Vitals:   01/21/21 1315 01/21/21 1330 01/21/21 1345 01/21/21 1400  BP: 107/61 (!) 111/58 (!) 105/59 102/60  Pulse: 94 96 97 99  Resp: '20 17 18 ' (!) 21  Temp:      TempSrc:      SpO2: 100% 99% 99% 100%  Weight:      Height:       Physical Exam General:chronically ill appearing male in NAD Heart:IRIR, no mrg Lungs:CTAB, nml WOB on RA Abdomen:soft, NTND Extremities:1-2+ edema b/l thighs r>L, multiple areas of erythema on b/l legs but improving, R thigh with wound vac in place Dialysis Access: [facility]   Filed Weights   01/19/21 1735 01/20/21 0430 01/21/21 0355  Weight: 115.5 kg 113.8 kg 113.9 kg    Intake/Output Summary (Last 24 hours) at 01/21/2021 1421 Last data filed at 01/21/2021 0600 Gross per 24 hour  Intake 2907.77 ml  Output 370 ml  Net 2537.77 ml     Additional Objective Labs: Basic Metabolic Panel: Recent Labs  Lab 01/18/21 0342 01/18/21 0344 01/19/21 0037 01/20/21 0706 01/20/21 1840 01/21/21 0613  NA 138   < > 137 137 136 136  K 4.4   < > 3.5 3.7 3.6 3.9  CL 102  --  101 96*  --  96*  CO2 17*  --  22 21*  --  17*  GLUCOSE 146*  --  140* 189*  --  149*  BUN 56*  --  32* 21  --  30*  CREATININE 7.26*  --  5.10* 3.72*  --  4.67*  CALCIUM 8.8*  --  8.3* 8.6*  --  8.7*  PHOS 5.3*  --  3.8  --   --   --    < > = values in this interval not displayed.    Liver Function Tests: Recent Labs  Lab 01/19/21 0037 01/20/21 0706 01/21/21 0613  AST '28 28 29  ' ALT '8 9 6  ' ALKPHOS 87 107 104  BILITOT 1.4* 1.5* 2.0*  PROT 6.1* 6.4* 5.7*  ALBUMIN 2.5* 2.3* 2.2*    CBC: Recent Labs  Lab 01/17/21 2048 01/17/21 2201 01/18/21 0342 01/18/21 0344 01/19/21 0037 01/20/21 0706 01/20/21 1840 01/21/21 0613  WBC 20.7*  --  19.7*  --  19.1* 19.8*  --  22.3*  NEUTROABS 17.2*  --  16.5*  --  16.9* 17.0*  --  19.4*  HGB 8.8*   < > 8.6*    < > 8.2* 7.9* 8.8* 8.0*  HCT 29.0*   < > 27.8*   < > 26.1* 25.1* 26.0* 25.4*  MCV 92.4  --  93.3  --  89.7 88.4  --  88.8  PLT 261  --  228  --  193 235  --  239   < > = values in this interval not displayed.    Blood Culture    Component Value Date/Time   SDES ABSCESS 01/20/2021 1710   SPECREQUEST RIGHT THIGH ABSCESS SPEC A 01/20/2021 1710   CULT  01/20/2021 1710    TOO YOUNG TO READ Performed at [facility], [place] [address], [place], [place] [address]    REPTSTATUS PENDING 01/20/2021 1710    CBG: Recent Labs  Lab 01/20/21 1935 01/20/21 2343 01/21/21 0330 01/21/21 0726 01/21/21 1122  GLUCAP 173* 167* 166* 140* 141*     Studies/Results: DG  Abd 1 View  Result Date: 01/20/2021 CLINICAL DATA:  Check gastric catheter placement EXAM: ABDOMEN - 1 VIEW COMPARISON:  None. FINDINGS: Gastric catheter is noted deep within the stomach. No obstructive changes are seen. No bony abnormality is noted. IMPRESSION: Gastric catheter in the distal stomach. Electronically Signed   By: Inez Catalina M.D.   On: 01/20/2021 23:49   CT FEMUR RIGHT WO CONTRAST  Result Date: 01/20/2021 CLINICAL DATA:  Right thigh swelling, mass. Downtrending hemoglobin. EXAM: CT OF THE LOWER RIGHT EXTREMITY WITHOUT CONTRAST TECHNIQUE: Multidetector CT imaging of the right lower extremity was performed according to the standard protocol. COMPARISON:  Right tibia-fibula CT 01/19/2021 FINDINGS: Bones/Joint/Cartilage Right femur intact without fracture or dislocation. Mild diffuse right hip joint space narrowing. No appreciable right hip joint effusion. Mild medial compartment joint space narrowing of the right knee with small knee joint effusion. No bony erosion or periosteal elevation. Ligaments Suboptimally assessed by CT. Muscles and Tendons Large mixed density collection predominantly located within the right vastus lateralis muscle measuring approximately 29 x 7 x 10 cm (series 10, image 73; series 5, image 91).  There are multiple foci of air within the collection. Collection extends from the level of the proximal femoral diaphysis to the level of the distal femoral metaphysis. No additional intramuscular collections. No acute tendinous abnormality is evident. Soft tissues Extensive circumferential subcutaneous edema throughout the right thigh with associated skin thickening. No foci of gas within the soft tissues at outside of the previously described collection. Mildly prominent right inguinal lymph nodes are likely reactive. IMPRESSION: 1. Large mixed density collection predominantly located within the right vastus lateralis muscle measuring approximately 29 x 7 x 10 cm with multiple foci of air within the collection. Findings are concerning for hematoma with superimposed infection. 2. Extensive circumferential subcutaneous edema throughout the right thigh with associated skin thickening, suggesting cellulitis. No foci of gas within the soft tissues at outside of the previously described collection. 3. No acute osseous abnormality of the right femur. These results will be called to the ordering clinician or representative by the Radiologist Assistant, and communication documented in the PACS or [employer]. Electronically Signed   By: Davina Poke D.O.   On: 01/20/2021 12:16   DG CHEST PORT 1 VIEW  Result Date: 01/20/2021 CLINICAL DATA:  Respiratory failure EXAM: PORTABLE CHEST 1 VIEW COMPARISON:  01/17/2021 FINDINGS: Cardiac shadow is enlarged but stable. Postsurgical changes are again seen. Endotracheal tube is noted approximately 2 cm above the carina. Left jugular dialysis catheter is again seen and stable. Defibrillator is again noted and stable. Lungs are hypoinflated with crowding of the vascular markings similar to that seen on the prior exam. Persistent vascular congestion remains. IMPRESSION: Persistent vascular congestion. Tubes and lines as described in satisfactory position. Electronically  Signed   By: Inez Catalina M.D.   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 (01/19/21 2033)   vancomycin (VANCOCIN) 1250 mg/259m IVPB (Vial-Mate [employer])     [START ON 01/24/2021] vancomycin      atorvastatin  40 mg Oral QPM   chlorhexidine gluconate (MEDLINE KIT)  15 mL Mouth Rinse BID   Chlorhexidine Gluconate Cloth  6 each Topical Q0600   Chlorhexidine Gluconate Cloth  6 each Topical Q0600   docusate  100 mg Per  Tube BID   Gerhardt's butt cream   Topical BID   insulin aspart  0-6 Units Subcutaneous Q4H   levothyroxine  50 mcg Per Tube Q0600   mouth rinse  15 mL Mouth Rinse 10 times per day   midodrine  10 mg Oral TID WC   polyethylene glycol  17 g Per Tube Daily    Dialysis Orders: MWF at [facility] 4hr, 450/800, EDW 117kg, 3K/2.5Ca, TDC, heparin 4900 bolus + 500/hr    Assessment/Plan:  R thigh infection w/ pyomyositis + bilat lower leg cellulitis - sp ID R thigh abscess. 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  Will dc Na thio and okay to resume phoslo.  Acute encephalopathy - Improved. Acute on chronic hypercapnic respiratory failure - Improved. CXR with volume overload on admit. UF total 5L over last 2 days.  Now on RA.  ESRD -  on HD MWF. Next HD today.  Volume overload - Edema improving.  Net UF 5L over last 2 days.  Under edw if weights correct.  Continue to titrate down volume as tolerated.    Hypertension - chronic hypotension on midodrine.    Anemia of CKD - Hgb 8.2.  Will order aranesp with HD tomorrow.   Secondary Hyperparathyroidism -  Ca and phos in goal.  Continue binders. No VDRA noted.  Nutrition - Renal diet w/fluid  restrictions. Diastolic HF - volume control with HD 11. DMT2 12. PAF - per chart plan for cardioversion later this month   Kelly Splinter, MD 01/21/2021, 2:26 PM

## 2021-01-21 NOTE — Progress Notes (Signed)
Extubation done at this time. Patient is able to speak his name and place.  SPO2 97% on 2L [place].  HR 91, RR 20, SPO2 97%.  No complications noted.

## 2021-01-21 NOTE — TOC Initial Note (Signed)
Transition of Care [facility]) - Initial/Assessment Note    Patient Details  Name: Kyle Walton MRN: [id number] Date of Birth: 07-10-1956  [facility]) CM/SW Contact:    Verdell Carmine, RN Phone Number: 01/21/2021, 12:21 PM  Clinical Narrative:                 Patient admitted from facility, with a thigh Right, intramuscular abbess.. He has ESRD on 2L and bipap at home. Dialysis M-W_F. Lives with spouse, was at SNF at some point.  OR for draining of thigh abscess. Patient is requesitng a new BiPap machine. Will have to see where he originally received it.  CM/CSW will follow for needs..  .   Expected Discharge Plan: Skilled Nursing Facility Barriers to Discharge: Continued Medical Work up   Patient Goals and CMS Choice        Expected Discharge Plan and Services Expected Discharge Plan: [place] In-house Referral: Clinical Social Work     Living arrangements for the past 2 months: [place]                                      Prior Living Arrangements/Services Living arrangements for the past 2 months: [place] Lives with:: Facility Resident Patient language and need for interpreter reviewed:: Yes        Need for Family Participation in Patient Care: Yes (Comment) Care giver support system in place?: Yes (comment)   Criminal Activity/Legal Involvement Pertinent to Current Situation/Hospitalization: No - Comment as needed  Activities of Daily Living      Permission Sought/Granted                  Emotional Assessment       Orientation: : Oriented to Situation, Oriented to Place, Oriented to Self Alcohol / Substance Use: Not Applicable Psych Involvement: No (comment)  Admission diagnosis:  Hypoxia [R09.02] Cellulitis of right lower extremity [L03.115] Acute on chronic diastolic (congestive) heart failure (HCC) [I50.33] Sepsis, due to unspecified organism, unspecified whether acute organ  dysfunction present [facility]) [A41.9] Patient Active Problem List   Diagnosis Date Noted   Acute on chronic diastolic (congestive) heart failure ([place]) 01/18/2021   Cellulitis 01/18/2021   Acute encephalopathy 01/18/2021   High anion gap metabolic acidosis 123456   Hypotension 01/18/2021   Acquired hypothyroidism 01/18/2021   End-stage renal disease on hemodialysis ([place]) 01/18/2021   Dry gangrene ([place]) 01/18/2021   Sepsis, unspecified organism ([place]) 01/18/2021   Pressure injury of skin 12/11/2020   Cellulitis of right leg 12/03/2020   Severe sepsis ([place]) 12/03/2020   Aggressive behavior    CAD (coronary artery disease) 10/27/2020   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   Cardiopulmonary arrest with successful resuscitation ([place]) 10/27/2020   Diabetes mellitus with peripheral vascular disease ([employer]) 10/27/2020   History of DVT (deep vein thrombosis) 10/27/2020   Essential hypertension 10/27/2020   OSA (obstructive sleep apnea) 10/27/2020   Paroxysmal atrial fibrillation ([place]) 10/27/2020   Secondary hyperparathyroidism, renal ([place]) 10/27/2020   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 ([place]) 10/27/2020   Chronic diastolic congestive heart failure, NYHA class 4 ([place]) 10/27/2020   ESRD on hemodialysis ([place]) 10/27/2020   Impaired physical mobility 10/27/2020   Acute on chronic respiratory failure with hypoxia and hypercapnia ([place]) 10/27/2020   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

## 2021-01-21 NOTE — Progress Notes (Addendum)
NAME:  Kyle Walton, MRN:  [id number], DOB:  [birth year], LOS: 3 ADMISSION DATE:  01/17/2021, CONSULTATION DATE:  01/18/2021 REFERRING MD:  Dr. Alvino Chapel, CHIEF COMPLAINT:  Hypotension    History of Present Illness:  65 year old male presents to ED on 7/11 with right leg pain and swelling. Recent hospitalization 5/27-6/24 for RLE cellulitis. On arrival to ED patient with hypoxia requiring 2L [place] and lethargic. ABG 7.195/52.4/60. Placed on BiPAP. CXR with central vascular congestion. WBC 20.7. Lactic Acid 1.2. Troponin 420. Crt 7.32. Given Cefepime/Vancomycin. BP on arrival 114/49 however during stay dropped to 89/45. Critical Care consulted.   On assessment patient is alert, states his BP normally runs [id number] systolic with 10 mg midodrine. Reports has not taken afternoon dose today.  Currently BP 92/55  Pertinent  Medical History  CAD/CABG, Diastolic HF, PAF/Flutter s/p AICD on Eliquis, DM, PVD s/p toe amputations, ESRD HD MWF, OSA on CPAP at HS, Chronic hypotension on Midodrine   Significant Hospital Events: Including procedures, antibiotic start and stop dates in addition to other pertinent events   7/12 admitted with encephalopathy and sepsis, started on vanc, ceftriaxone. HD on 7/12. 7/14> to OR for debridement of thigh hematoma> to ICU after intubated. Switched to vanc + zosyn  Interim History / Subjective:  Intubated for surgery, no plans to extubate after due to encephalopathy.  Objective   Blood pressure 115/70, pulse 75, temperature 97.9 F (36.6 C), temperature source Axillary, resp. rate 19, height '5\' 11"'$  (1.803 m), weight 113.9 kg, SpO2 100 %.    Vent Mode: PRVC FiO2 (%):  [30 %-100 %] 30 % Set Rate:  [18 bmp] 18 bmp Vt Set:  [600 mL] 600 mL PEEP:  [5 cmH20] 5 cmH20 Plateau Pressure:  [22 cmH20-25 cmH20] 22 cmH20   Intake/Output Summary (Last 24 hours) at 01/21/2021 0735 Last data filed at 01/21/2021 0600 Gross per 24 hour  Intake 2907.77 ml  Output 370 ml  Net 2537.77 ml     Filed Weights   01/19/21 1735 01/20/21 0430 01/21/21 0355  Weight: 115.5 kg 113.8 kg 113.9 kg    Examination: General: no acute distress, intubated/sedated HENT: [place]/AT, moist mucous membranes, sclera anicteric, PERRL Lungs: clear to auscultation bilaterally. No wheezing Cardiovascular: irregularly irregular. No murmurs. Abdomen: soft, non-distended, bowel sounds present  Extremities: warm, no edema of LLE, right thigh wound dressing and JP drain in place - serosanguinous fluid draing, minimal to no drainage from wound vac, RLE erythematous, warm, with trace edema Neuro: sedated, withdraws to pain GU: deferred  Resolved Hospital Problem list     Assessment & Plan:  Acute respiratory failure with hypoxia and hypercapnia, requiring MV post-operatively -LTVV, 4-8cc/kg IBW with goal Pplat<30 and DP<15. -SAT & SBT, will titrate down propofol and wean off vent as tolerated -VAP prevention protocol. -PAD protocol for sedation  Acute metabolic encephalopathy due to sepsis -limit sedation as much as possible  Cellulitis RLE causing severe sepsis MRSA PCR positive, sp debridement on 7/14, wound vac in place, blood in the JP drain -Ortho following -continue vancomycin and zosyn -wound cultures sent from OR pending -holding anticoagulation for Afib, discuss need for further debridement with surgery  History of chronic hypotension on midodrine; distributive shock due to sedation vs cytokine response to surgery Normotensive this morning now off neo -continue midodrine 10 mg three times daily -hold metoprolol   ESRD On HD MWF -Nephology consulted - HD today   Hyperbilirubinemia due to sepsis Hyperammonemia -monitor LFTs  Chronic anemia due to  ESRD -transfuse for Hb<7 or hemodynamically significant bleeding  Diabetes- uncontrolled. A1c >9 -SSI very sensitive  -goal BG 140-180  Hypothyroidism -continue home synthroid  Best Practice (right click and "Reselect all  SmartList Selections" daily)  Diet/type: NPO DVT prophylaxis: SCD GI prophylaxis: PPI Lines: Dialysis Catheter Foley:  N/A Code Status:  full code Last date of multidisciplinary goals of care discussion [ N/A]  Labs   CBC: Recent Labs  Lab 01/17/21 2048 01/17/21 2201 01/18/21 0342 01/18/21 0344 01/19/21 0037 01/20/21 0706 01/20/21 1840 01/21/21 0613  WBC 20.7*  --  19.7*  --  19.1* 19.8*  --  22.3*  NEUTROABS 17.2*  --  16.5*  --  16.9* 17.0*  --  19.4*  HGB 8.8*   < > 8.6* 9.5* 8.2* 7.9* 8.8* 8.0*  HCT 29.0*   < > 27.8* 28.0* 26.1* 25.1* 26.0* 25.4*  MCV 92.4  --  93.3  --  89.7 88.4  --  88.8  PLT 261  --  228  --  193 235  --  239   < > = values in this interval not displayed.     Basic Metabolic Panel: Recent Labs  Lab 01/17/21 2048 01/17/21 2201 01/18/21 0342 01/18/21 0344 01/18/21 0907 01/19/21 0037 01/20/21 0706 01/20/21 1840  NA 138   < > 138 137  --  137 137 136  K 4.3   < > 4.4 4.2  --  3.5 3.7 3.6  CL 100  --  102  --   --  101 96*  --   CO2 20*  --  17*  --   --  22 21*  --   GLUCOSE 202*  --  146*  --   --  140* 189*  --   BUN 54*  --  56*  --   --  32* 21  --   CREATININE 7.32*  --  7.26*  --   --  5.10* 3.72*  --   CALCIUM 8.8*  --  8.8*  --   --  8.3* 8.6*  --   MG  --   --  1.6*  --  1.6* 1.9  --   --   PHOS  --   --  5.3*  --   --  3.8  --   --    < > = values in this interval not displayed.    GFR: Estimated Creatinine Clearance: 25.4 mL/min (A) (by C-G formula based on SCr of 3.72 mg/dL (H)). Recent Labs  Lab 01/18/21 0342 01/18/21 0907 01/18/21 2105 01/19/21 0037 01/19/21 0038 01/19/21 0412 01/20/21 0706 01/21/21 0613  PROCALCITON 1.24 1.30  --  1.60  --   --   --   --   WBC 19.7*  --   --  19.1*  --   --  19.8* 22.3*  LATICACIDVEN  --  1.6  1.4 1.4  --  1.2 1.1  --   --      Liver Function Tests: Recent Labs  Lab 01/17/21 2048 01/18/21 0342 01/19/21 0037 01/20/21 0706  AST '30 27 28 28  '$ ALT '8 8 8 9  '$ ALKPHOS 88 88  87 107  BILITOT 1.2 1.2 1.4* 1.5*  PROT 6.6 5.8* 6.1* 6.4*  ALBUMIN 2.2* 2.1* 2.5* 2.3*    No results for input(s): LIPASE, AMYLASE in the last 168 hours. Recent Labs  Lab 01/18/21 0200  AMMONIA 61*

## 2021-01-21 NOTE — Progress Notes (Signed)
Pharmacy Antibiotic Note  Tania Frieling is a 65 y.o. male with ESRD on MWF HD admitted on 01/17/2021 on antibiotics for RLE cellulitis and sepsis coverage. Further workup with CT showed large complex fluid collection concerning for infected hematoma in R vastus lateralis muscle, now s/p I&D on 7/14. Pharmacy consulted for Vancomycin and Zosyn dosing.   The patient is ESRD-MWF and received HD on 7/12 + 7/13 this week. The patient was loaded on 7/11, the maintenance dose was missed on 7/12, and then was given after HD on 7/13. Will give a slightly higher dose with HD today and check a VR in the AM.  Plan: - Vancomycin 1250 mg IV x 1 dose with HD-Fri 7/15 - Resume Vanc 1g/HD-MWF starting on 7/18 - Continue Zosyn 2.25 gm IV Q 8 hrs - Will continue to follow HD schedule/duration, culture results, LOT, and antibiotic de-escalation plans   Height: '5\' 11"'$  (180.3 cm) Weight: 113.9 kg (251 lb 1.7 oz) IBW/kg (Calculated) : 75.3  Temp (24hrs), Avg:98.6 F (37 C), Min:97.9 F (36.6 C), Max:99.7 F (37.6 C)  Recent Labs  Lab 01/17/21 2048 01/17/21 2106 01/18/21 0342 01/18/21 0907 01/18/21 2105 01/19/21 0037 01/19/21 0038 01/19/21 0412 01/20/21 0706 01/21/21 0613  WBC 20.7*  --  19.7*  --   --  19.1*  --   --  19.8* 22.3*  CREATININE 7.32*  --  7.26*  --   --  5.10*  --   --  3.72* 4.67*  LATICACIDVEN  --  1.2  --  1.6  1.4 1.4  --  1.2 1.1  --   --      Allergies  Allergen Reactions   Morphine Other (See Comments)    Per son, [name], pt becomes unresponsive/disoriented. Especially when given after HD tx   Liraglutide Diarrhea    Phenol Phenol     Vanc 7/11 >> CTX 7/12 >> 7/14 Zosyn 7/14 >>   7/11 Fluvid >> neg 7/11 BCx >> ngtd 7/14 MRSA PCR >> positive 7/14 R thigh abscess >>  Thank you for allowing pharmacy to be a part of this patient's care.  Alycia Rossetti, PharmD, BCPS Clinical Pharmacist Clinical phone for 01/21/2021: [phone] 01/21/2021 3:20 PM   **Pharmacist  phone directory can now be found on [email or web address] ([email or web address]).  Listed under [place].

## 2021-01-21 NOTE — Progress Notes (Signed)
Subjective:  Kyle Walton is a 65 y.o. male, 1 Day Post-Op   s/p Procedure(s): IRRIGATION AND DEBRIDEMENT OF THIGH, APPLICATION OF WOUND VAC   Patient reports pain as mild to moderate.  Patient reports an improvement in pain.  Still reports some pain at the lateral thigh.  Denies fever or chills.  Denies numbness or tingling.  Objective:   VITALS:   Vitals:   01/21/21 1115 01/21/21 1124 01/21/21 1130 01/21/21 1200  BP: (!) 90/48  113/63 114/78  Pulse: 82  89 90  Resp: (!) 21  (!) 22 (!) 21  Temp:  97.9 F (36.6 C)    TempSrc:  Oral    SpO2: 100%  98% 98%  Weight:      Height:       Constitutional: General Appearance: NAD, in hospital bed Eyes: Lens (normal) clear: both eyes. Head: Head: normocephalic and atraumatic. Lungs: Respiratory effort: no dyspnea. Skin: Inspection and palpation: no rash, lesions Neurologic: Cranial Nerves: grossly intact. Sensation: grossly intact. Psychiatric: Insight: good judgement and insight. Mental Status: normal mood and affect and active and alert. Orientation: to time, place, and person.  Right Lower Extremity:  Inspection: Wound VAC present on lateral thigh, ABD at proximal hip with drain with dark red fluid present.  Erythema at the lateral thigh adjacent to the wound VAC bandage.  Looks slightly improved compared to yesterday.  Status post amputation of all 5 toes.  DP pulse 1+.  Able to feel sensation to light touch of the ankle, lower leg, knee.  Able to gently move the knee and ankle.   Lab Results  Component Value Date   WBC 22.3 (H) 01/21/2021   HGB 8.0 (L) 01/21/2021   HCT 25.4 (L) 01/21/2021   MCV 88.8 01/21/2021   PLT 239 01/21/2021   BMET    Component Value Date/Time   NA 136 01/21/2021 0613   K 3.9 01/21/2021 0613   CL 96 (L) 01/21/2021 0613   CO2 17 (L) 01/21/2021 0613   GLUCOSE 149 (H) 01/21/2021 0613   BUN 30 (H) 01/21/2021 0613   CREATININE 4.67 (H) 01/21/2021 0613   CALCIUM 8.7 (L) 01/21/2021 [id number]    GFRNONAA 13 (L) 01/21/2021 [id number]   GFRAA 6 (L) 01/17/2020 1605     Assessment/Plan: 1 Day Post-Op   Principal Problem:   Acute encephalopathy Active Problems:   Diabetes mellitus with peripheral vascular disease ([place])   History of DVT (deep vein thrombosis)   Essential hypertension   OSA (obstructive sleep apnea)   Paroxysmal atrial fibrillation (HCC)   ESRD on hemodialysis (HCC)   Impaired physical mobility   Acute on chronic respiratory failure with hypoxia and hypercapnia (HCC)   Cellulitis of right leg   Acute on chronic diastolic (congestive) heart failure (HCC)   Cellulitis   High anion gap metabolic acidosis   Hypotension   Acquired hypothyroidism   End-stage renal disease on hemodialysis (HCC)   Dry gangrene (HCC)   Sepsis, unspecified organism ([place])   Up with therapy Patient now extubated, reports improvement in symptoms. Move hip knee and ankle as tolerated.  Wound care: Change proximal ABD as needed when saturated.  Maintain drain until less than 30 cc of drainage per 12-hour shift.  Maintain wound VAC and [place] surrounding it for 5 days.   Discharge: Per primary  Weightbearing Status: WBAT DVT Prophylaxis: Per primary   Dutch Gray Daylan Juhnke 01/21/2021, 1:15 PM  Jonelle Sidle PA-C  Physician Assistant with Dr. Lillia Abed [place]

## 2021-01-22 DIAGNOSIS — J9621 Acute and chronic respiratory failure with hypoxia: Secondary | ICD-10-CM | POA: Diagnosis not present

## 2021-01-22 DIAGNOSIS — G9340 Encephalopathy, unspecified: Secondary | ICD-10-CM | POA: Diagnosis not present

## 2021-01-22 DIAGNOSIS — J9622 Acute and chronic respiratory failure with hypercapnia: Secondary | ICD-10-CM | POA: Diagnosis not present

## 2021-01-22 LAB — CBC WITH DIFFERENTIAL/PLATELET
Abs Immature Granulocytes: 0.09 10*3/uL — ABNORMAL HIGH (ref 0.00–0.07)
Basophils Absolute: 0 10*3/uL (ref 0.0–0.1)
Basophils Relative: 0 %
Eosinophils Absolute: 0.2 10*3/uL (ref 0.0–0.5)
Eosinophils Relative: 1 %
HCT: 23.2 % — ABNORMAL LOW (ref 39.0–52.0)
Hemoglobin: 7.3 g/dL — ABNORMAL LOW (ref 13.0–17.0)
Immature Granulocytes: 1 %
Lymphocytes Relative: 4 %
Lymphs Abs: 0.7 10*3/uL (ref 0.7–4.0)
MCH: 27.8 pg (ref 26.0–34.0)
MCHC: 31.5 g/dL (ref 30.0–36.0)
MCV: 88.2 fL (ref 80.0–100.0)
Monocytes Absolute: 1.4 10*3/uL — ABNORMAL HIGH (ref 0.1–1.0)
Monocytes Relative: 8 %
Neutro Abs: 14.2 10*3/uL — ABNORMAL HIGH (ref 1.7–7.7)
Neutrophils Relative %: 86 %
Platelets: 193 10*3/uL (ref 150–400)
RBC: 2.63 MIL/uL — ABNORMAL LOW (ref 4.22–5.81)
RDW: 18.6 % — ABNORMAL HIGH (ref 11.5–15.5)
WBC: 16.6 10*3/uL — ABNORMAL HIGH (ref 4.0–10.5)
nRBC: 0 % (ref 0.0–0.2)

## 2021-01-22 LAB — GLUCOSE, CAPILLARY
Glucose-Capillary: 109 mg/dL — ABNORMAL HIGH (ref 70–99)
Glucose-Capillary: 124 mg/dL — ABNORMAL HIGH (ref 70–99)
Glucose-Capillary: 145 mg/dL — ABNORMAL HIGH (ref 70–99)
Glucose-Capillary: 163 mg/dL — ABNORMAL HIGH (ref 70–99)
Glucose-Capillary: 183 mg/dL — ABNORMAL HIGH (ref 70–99)
Glucose-Capillary: 190 mg/dL — ABNORMAL HIGH (ref 70–99)

## 2021-01-22 LAB — CULTURE, BLOOD (ROUTINE X 2)
Culture: NO GROWTH
Culture: NO GROWTH
Special Requests: ADEQUATE
Special Requests: ADEQUATE

## 2021-01-22 LAB — COMPREHENSIVE METABOLIC PANEL
ALT: 9 U/L (ref 0–44)
AST: 28 U/L (ref 15–41)
Albumin: 2.3 g/dL — ABNORMAL LOW (ref 3.5–5.0)
Alkaline Phosphatase: 84 U/L (ref 38–126)
Anion gap: 12 (ref 5–15)
BUN: 16 mg/dL (ref 8–23)
CO2: 25 mmol/L (ref 22–32)
Calcium: 8.8 mg/dL — ABNORMAL LOW (ref 8.9–10.3)
Chloride: 101 mmol/L (ref 98–111)
Creatinine, Ser: 3.06 mg/dL — ABNORMAL HIGH (ref 0.61–1.24)
GFR, Estimated: 22 mL/min — ABNORMAL LOW (ref >60)
Glucose, Bld: 122 mg/dL — ABNORMAL HIGH (ref 70–99)
Potassium: 3.8 mmol/L (ref 3.5–5.1)
Sodium: 138 mmol/L (ref 135–145)
Total Bilirubin: 1.6 mg/dL — ABNORMAL HIGH (ref 0.3–1.2)
Total Protein: 5.7 g/dL — ABNORMAL LOW (ref 6.5–8.1)

## 2021-01-22 LAB — VANCOMYCIN, RANDOM: Vancomycin Rm: 25

## 2021-01-22 MED ORDER — POLYETHYLENE GLYCOL 3350 17 G PO PACK
17.0000 g | PACK | Freq: Every day | ORAL | Status: DC
  Administered 2021-01-22 – 2021-01-27 (×3): 17 g via ORAL
  Filled 2021-01-22 (×4): qty 1

## 2021-01-22 MED ORDER — LEVOTHYROXINE SODIUM 50 MCG PO TABS
50.0000 ug | ORAL_TABLET | Freq: Every day | ORAL | Status: DC
  Administered 2021-01-22 – 2021-01-28 (×7): 50 ug via ORAL
  Filled 2021-01-22 (×6): qty 1

## 2021-01-22 MED ORDER — DARBEPOETIN ALFA 100 MCG/0.5ML IJ SOSY: 100.0000 ug | PREFILLED_SYRINGE | INTRAMUSCULAR | Status: DC

## 2021-01-22 MED ORDER — DARBEPOETIN ALFA 100 MCG/0.5ML IJ SOSY
100.0000 ug | PREFILLED_SYRINGE | Freq: Once | INTRAMUSCULAR | Status: AC
  Administered 2021-01-22: 100 ug via INTRAVENOUS
  Filled 2021-01-22: qty 0.5

## 2021-01-22 MED ORDER — AMIODARONE HCL 200 MG PO TABS
200.0000 mg | ORAL_TABLET | Freq: Two times a day (BID) | ORAL | Status: DC
  Administered 2021-01-22 – 2021-01-28 (×12): 200 mg via ORAL
  Filled 2021-01-22 (×13): qty 1

## 2021-01-22 NOTE — Progress Notes (Signed)
Kyle Walton  MRN: [id number] DOB/Age: Sep 18, 1955 65 y.o. Physician: Ander Slade, Kyle.D. 2 Days Post-Op Procedure(s) (LRB): IRRIGATION AND DEBRIDEMENT OF THIGH, APPLICATION OF WOUND VAC (Right)  Subjective: Patient sitting upright in bed complaining of continued right thigh and leg pain.  Does report however that he is feeling better than prior to surgery. Vital Signs Temp:  [97.8 F (36.6 C)-99.1 F (37.3 C)] 99.1 F (37.3 C) (07/16 0341) Pulse Rate:  [81-103] 96 (07/16 0900) Resp:  [12-27] 24 (07/16 0900) BP: (77-128)/(35-107) 77/35 (07/16 0900) SpO2:  [89 %-100 %] 89 % (07/16 0900) FiO2 (%):  [40 %] 40 % (07/16 0340)  Lab Results Recent Labs    01/21/21 0613 01/22/21 0028  WBC 22.3* 16.6*  HGB 8.0* 7.3*  HCT 25.4* 23.2*  PLT 239 193   BMET Recent Labs    01/21/21 0613 01/22/21 0028  NA 136 138  K 3.9 3.8  CL 96* 101  CO2 17* 25  GLUCOSE 149* 122*  BUN 30* 16  CREATININE 4.67* 3.06*  CALCIUM 8.7* 8.8*   INR  Date Value Ref Range Status  01/18/2021 2.6 (H) 0.8 - 1.2 Final    Comment:    (NOTE) INR goal varies based on device and disease states. Performed at [facility], [place] [address], [place], [place] [address]      Exam  Vacuum-assisted dressing to the right thigh intact draining with good suction.  Jackson-Pratt also draining.  Thigh diffusely tender but compartments soft.  Grossly neurovascular intact distally with limited mobility secondary to complaints of pain as well as generalized swelling right lower extremity  Impression:  Right thigh pyomyositis with sepsis status post right thigh I&D.  VAC and JP continue to drain.  Plan Continue with current drains.  Reassessment Monday by Dr. Stann Mainland for determination of further orthopedic care plan Kyle Walton Kyle Walton 01/22/2021, 9:47 AM   Contact # [phone]

## 2021-01-22 NOTE — Evaluation (Signed)
Physical Therapy Evaluation Patient Details Name: Kyle Walton MRN: [id number] DOB: Jul 25, 1955 Today's Date: 01/22/2021   History of Present Illness  65 year old male admitted with somnolence and confusion; R thigh infection w/ pyomyositis + bilat lower leg cellulitis - sp ID R thigh abscess with placement of wound vac 7/14. Post op Acute respiratory failure with hypoxemia and hypercapnia. PMH: ESRD on HD (MWF), DM2, multiple toe amputations, morbid obesity, chronic diastolic heart failure, paroxysmal fibrillation, chronic hypoxic respiratory failure and hypothyroidism.  He is a nursing home resident.  Clinical Impression  Pt admitted with above diagnosis. Pt received awake and supine in bed with confusion and difficulty giving an accurate history. Pt required tot A +2 to sit up EOB and consistent mod A needed to maintain sitting due to posterior lean. Pt very hypersensitive R thigh, gently desensitization initiated for pt to work on in room. Pt unable to attempt standing or transferring due to weakness and pain.  Pt currently with functional limitations due to the deficits listed below (see PT Problem List). Pt will benefit from skilled PT to increase their independence and safety with mobility to allow discharge to the venue listed below.   BP supine 91/53       Sitting 85/50       Return to supine 88/54 HR 101-128 bpm SpO2 89-90% on RA     Follow Up Recommendations SNF    Equipment Recommendations  3in1 (PT);Wheelchair (measurements PT);Wheelchair cushion (measurements PT);Hospital bed;Other (comment) Metallurgist)    Recommendations for Other Services       Precautions / Restrictions Precautions Precautions: Fall Precaution Comments: contact precautions; Also noteworthy that lateral aspect of R thigh is very painful and sensitive to even light touch, legally blind Restrictions Weight Bearing Restrictions: No Other Position/Activity Restrictions: wound vac R thigh       Mobility  Bed Mobility Overal bed mobility: Needs Assistance Bed Mobility: Sit to Supine;Supine to Sit     Supine to sit: Total assist;+2 for physical assistance;HOB elevated Sit to supine: Total assist;+2 for physical assistance   General bed mobility comments: pt assisted only with sliding LE's partially to EOB, needed verbal and tactile cues for all mvmt. Tot A at LE's and trunk for coming to sitting and returning to supine.    Transfers                 General transfer comment: unable to attempt due to poor tolerance of sitting EOB  Ambulation/Gait             General Gait Details: unable  Stairs            Wheelchair Mobility    Modified Rankin (Stroke Patients Only)       Balance Overall balance assessment: Needs assistance Sitting-balance support: Feet supported Sitting balance-Leahy Scale: Poor Sitting balance - Comments: posterior lean in sitting, vc's to bring trunk fwd, pt cuold mantain only 3-5 secs before leaning back again Postural control: Posterior lean                                   Pertinent Vitals/Pain Pain Assessment: 0-10 Pain Score: 9  Faces Pain Scale: Hurts even more Pain Location: RLE knee to hip Pain Descriptors / Indicators: Grimacing;Guarding;Sharp Pain Intervention(s): Limited activity within patient's tolerance;Monitored during session;Repositioned    Home Living Family/patient expects to be discharged to:: Skilled nursing facility Living Arrangements: Spouse/significant other  Additional Comments: pt from SNF, does not remember that he went to SNF after inpt rehab at [facility]    Prior Function Level of Independence: Needs assistance   Gait / Transfers Assistance Needed: in April, pt ambulated with a rollator and used a hoveround. Thinks he has been ambulating 20' recently but question accuracy of this  ADL's / Homemaking Assistance Needed: Previous note states pt was independent  with ADL        Hand Dominance   Dominant Hand: Right    Extremity/Trunk Assessment   Upper Extremity Assessment Upper Extremity Assessment: Defer to OT evaluation    Lower Extremity Assessment Lower Extremity Assessment: Generalized weakness;RLE deficits/detail;LLE deficits/detail RLE Deficits / Details: ROM limited by pain, pt able to move R ankle in partial df, full pf, knee ext 2-/5 and was unable to reach full passive knee ext (lacking ~15 deg), hip flex 1/5, hip abd/add 2/5 RLE Sensation:  (hypersensitivity) RLE Coordination: decreased gross motor LLE Deficits / Details: grossly 3-/5 throughout LLE Sensation: WNL LLE Coordination: decreased gross motor    Cervical / Trunk Assessment Cervical / Trunk Assessment: Kyphotic  Communication   Communication: No difficulties  Cognition Arousal/Alertness: Awake/alert Behavior During Therapy: WFL for tasks assessed/performed Overall Cognitive Status: Impaired/Different from baseline Area of Impairment: Orientation;Attention;Memory;Following commands;Safety/judgement;Awareness;Problem solving                 Orientation Level: Time;Place;Situation Current Attention Level: Sustained Memory: Decreased short-term memory Following Commands: Follows one step commands consistently Safety/Judgement: Decreased awareness of safety;Decreased awareness of deficits Awareness: Intellectual Problem Solving: Difficulty sequencing;Requires verbal cues General Comments: pt confused and mental status worsened with exertion      General Comments General comments (skin integrity, edema, etc.): pt given washcloth to rub RLE gently to being desensitization. Bed placed in chair position with pillows behind pt's back for improved posture. Pt's O2 sats in 80's on RA and pt hypotensive. RN notified    Exercises General Exercises - Lower Extremity Ankle Circles/Pumps: AROM;Both;10 reps;Supine Long Arc Quad: AROM;Both;Seated;5 reps    Assessment/Plan    PT Assessment Patient needs continued PT services  PT Problem List Decreased strength;Decreased range of motion;Decreased activity tolerance;Decreased balance;Decreased mobility;Decreased coordination;Decreased cognition;Decreased knowledge of use of DME;Decreased safety awareness;Decreased skin integrity;Obesity;Pain;Cardiopulmonary status limiting activity       PT Treatment Interventions DME instruction;Gait training;Stair training;Functional mobility training;Therapeutic activities;Therapeutic exercise;Balance training;Neuromuscular re-education;Cognitive remediation;Patient/family education;Wheelchair mobility training    PT Goals (Current goals can be found in the Care Plan section)  Acute Rehab PT Goals Patient Stated Goal: To be able to walk again PT Goal Formulation: With patient Time For Goal Achievement: 02/05/21 Potential to Achieve Goals: Fair    Frequency Min 2X/week   Barriers to discharge        Co-evaluation PT/OT/SLP Co-Evaluation/Treatment: Yes Reason for Co-Treatment: Complexity of the patient's impairments (multi-system involvement);Necessary to address cognition/behavior during functional activity;For patient/therapist safety PT goals addressed during session: Mobility/safety with mobility;Balance OT goals addressed during session: ADL's and self-care       AM-PAC PT "6 Clicks" Mobility  Outcome Measure Help needed turning from your back to your side while in a flat bed without using bedrails?: Total Help needed moving from lying on your back to sitting on the side of a flat bed without using bedrails?: Total Help needed moving to and from a bed to a chair (including a wheelchair)?: Total Help needed standing up from a chair using your arms (e.g., wheelchair or bedside chair)?: Total Help needed to  walk in hospital room?: Total Help needed climbing 3-5 steps with a railing? : Total 6 Click Score: 6    End of Session   Activity  Tolerance: Patient limited by pain;Patient limited by fatigue Patient left: in bed;with call bell/phone within reach;with bed alarm set Nurse Communication: Mobility status (SPO2, BP) PT Visit Diagnosis: Muscle weakness (generalized) (M62.81);Difficulty in walking, not elsewhere classified (R26.2);Pain Pain - Right/Left: Right Pain - part of body: Leg    Time: [id number] PT Time Calculation (min) (ACUTE ONLY): 34 min   Charges:   PT Evaluation $PT Eval High Complexity: 1 High          [place]  Pager [phone] Office [place] 01/22/2021, 1:38 PM

## 2021-01-22 NOTE — Progress Notes (Signed)
Pharmacy Antibiotic Note  Kyle Walton is a 65 y.o. male with ESRD on MWF HD admitted on 01/17/2021 on antibiotics for RLE cellulitis and sepsis coverage. Further workup with CT showed large complex fluid collection concerning for infected hematoma in R vastus lateralis muscle, now s/p I&D on 7/14. Pharmacy consulted for Vancomycin and Zosyn dosing.   The patient is ESRD-MWF and received HD on 7/12 + 7/13 this week. The patient was loaded on 7/11, the maintenance dose was missed on 7/12, and then was given after HD on 7/13.   Vanc random 25 today  Plan: - Resume Vanc 1g/HD-MWF starting on 7/18 - Continue Zosyn 2.25 gm IV Q 8 hrs - Will continue to follow HD schedule/duration, culture results, LOT, and antibiotic de-escalation plans   Height: '5\' 11"'$  (180.3 cm) Weight: 113.9 kg (251 lb 1.7 oz) IBW/kg (Calculated) : 75.3  Temp (24hrs), Avg:98.3 F (36.8 C), Min:97.8 F (36.6 C), Max:99.1 F (37.3 C)  Recent Labs  Lab 01/17/21 2106 01/18/21 0342 01/18/21 0907 01/18/21 2105 01/19/21 0037 01/19/21 0038 01/19/21 0412 01/20/21 0706 01/21/21 0613 01/22/21 0028  WBC  --  19.7*  --   --  19.1*  --   --  19.8* 22.3* 16.6*  CREATININE  --  7.26*  --   --  5.10*  --   --  3.72* 4.67* 3.06*  LATICACIDVEN 1.2  --  1.6  1.4 1.4  --  1.2 1.1  --   --   --   VANCORANDOM  --   --   --   --   --   --   --   --   --  25     Allergies  Allergen Reactions   Morphine Other (See Comments)    Per son, [name], pt becomes unresponsive/disoriented. Especially when given after HD tx   Liraglutide Diarrhea    Phenol Phenol    Barth Kirks, PharmD, BCCCP Clinical Pharmacist [phone]  Please check AMION for all [place] numbers  01/22/2021 10:22 AM

## 2021-01-22 NOTE — Progress Notes (Signed)
[facility] Progress Note   Subjective:   Patient seen and examined at bedside in room.  No new c/o's.   Objective Vitals:   01/22/21 1146 01/22/21 1200 01/22/21 1230 01/22/21 1300  BP:  (!) 92/59 (!) 87/45 (!) 96/51  Pulse:  (!) 104 (!) 111 (!) 107  Resp:  (!) '23 20 19  '$ Temp: 97.9 F (36.6 C)     TempSrc: Oral     SpO2:  99% 97% 97%  Weight:      Height:       Physical Exam General:chronically ill appearing male in NAD Heart:IRIR, no mrg Lungs:CTAB, nml WOB on RA Abdomen:soft, NTND Extremities:1-2+ edema b/l thighs r>L, multiple areas of erythema on b/l legs but improving, R thigh with wound vac in place Dialysis Access: [facility]   Filed Weights   01/19/21 1735 01/20/21 0430 01/21/21 0355  Weight: 115.5 kg 113.8 kg 113.9 kg    Intake/Output Summary (Last 24 hours) at 01/22/2021 1347 Last data filed at 01/22/2021 1300 Gross per 24 hour  Intake 357.45 ml  Output 2246 ml  Net -1888.55 ml     Additional Objective Labs: Basic Metabolic Panel: Recent Labs  Lab 01/18/21 0342 01/18/21 0344 01/19/21 0037 01/20/21 0706 01/20/21 1840 01/21/21 0613 01/22/21 0028  NA 138   < > 137 137 136 136 138  K 4.4   < > 3.5 3.7 3.6 3.9 3.8  CL 102  --  101 96*  --  96* 101  CO2 17*  --  22 21*  --  17* 25  GLUCOSE 146*  --  140* 189*  --  149* 122*  BUN 56*  --  32* 21  --  30* 16  CREATININE 7.26*  --  5.10* 3.72*  --  4.67* 3.06*  CALCIUM 8.8*  --  8.3* 8.6*  --  8.7* 8.8*  PHOS 5.3*  --  3.8  --   --   --   --    < > = values in this interval not displayed.    Liver Function Tests: Recent Labs  Lab 01/20/21 0706 01/21/21 0613 01/22/21 0028  AST '28 29 28  '$ ALT '9 6 9  '$ ALKPHOS 107 104 84  BILITOT 1.5* 2.0* 1.6*  PROT 6.4* 5.7* 5.7*  ALBUMIN 2.3* 2.2* 2.3*    CBC: Recent Labs  Lab 01/18/21 0342 01/18/21 0344 01/19/21 0037 01/20/21 0706 01/20/21 1840 01/21/21 0613 01/22/21 0028  WBC 19.7*  --  19.1* 19.8*  --  22.3* 16.6*  NEUTROABS 16.5*  --   16.9* 17.0*  --  19.4* 14.2*  HGB 8.6*   < > 8.2* 7.9* 8.8* 8.0* 7.3*  HCT 27.8*   < > 26.1* 25.1* 26.0* 25.4* 23.2*  MCV 93.3  --  89.7 88.4  --  88.8 88.2  PLT 228  --  193 235  --  239 193   < > = values in this interval not displayed.    Blood Culture    Component Value Date/Time   SDES ABSCESS 01/20/2021 1710   SPECREQUEST RIGHT THIGH ABSCESS SPEC A 01/20/2021 1710   CULT  01/20/2021 1710    CULTURE REINCUBATED FOR BETTER GROWTH Performed at [facility], [place] [address], [place], [place] [address]    REPTSTATUS PENDING 01/20/2021 1710    CBG: Recent Labs  Lab 01/21/21 1927 01/21/21 2332 01/22/21 0341 01/22/21 0728 01/22/21 1144  GLUCAP 123* 126* 124* 109* 163*     Studies/Results: DG Abd  1 View  Result Date: 01/20/2021 CLINICAL DATA:  Check gastric catheter placement EXAM: ABDOMEN - 1 VIEW COMPARISON:  None. FINDINGS: Gastric catheter is noted deep within the stomach. No obstructive changes are seen. No bony abnormality is noted. IMPRESSION: Gastric catheter in the distal stomach. Electronically Signed   By: Inez Catalina M.D.   On: 01/20/2021 23:49   DG CHEST PORT 1 VIEW  Result Date: 01/20/2021 CLINICAL DATA:  Respiratory failure EXAM: PORTABLE CHEST 1 VIEW COMPARISON:  01/17/2021 FINDINGS: Cardiac shadow is enlarged but stable. Postsurgical changes are again seen. Endotracheal tube is noted approximately 2 cm above the carina. Left jugular dialysis catheter is again seen and stable. Defibrillator is again noted and stable. Lungs are hypoinflated with crowding of the vascular markings similar to that seen on the prior exam. Persistent vascular congestion remains. IMPRESSION: Persistent vascular congestion. Tubes and lines as described in satisfactory position. Electronically Signed   By: Inez Catalina M.D.   On: 01/20/2021 20:47    Medications:  piperacillin-tazobactam (ZOSYN)  IV Stopped (01/22/21 [id number])   [START ON 01/24/2021] vancomycin      amiodarone  200 mg  Oral BID   atorvastatin  40 mg Oral QPM   calcium acetate  1,334 mg Oral TID WC   Chlorhexidine Gluconate Cloth  6 each Topical Q0600   Chlorhexidine Gluconate Cloth  6 each Topical Q0600   Gerhardt's butt cream   Topical BID   insulin aspart  0-6 Units Subcutaneous Q4H   levothyroxine  50 mcg Oral Q0600   midodrine  10 mg Oral TID WC   polyethylene glycol  17 g Oral Daily    Dialysis Orders: MWF at [facility] 4hr, 450/800, EDW 117kg, 3K/2.5Ca, TDC, heparin 4900 bolus + 500/hr    Assessment/Plan:  R thigh infection w/ abscess/ pyomyositis + bilat lower leg cellulitis - sp ID R thigh abscess on 7/14 by orthopedics. 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 Will dc Na thio and resume phoslo.  Acute encephalopathy - Improved. Acute on chronic hypercapnic respiratory failure - Improved. CXR with volume overload on admit. UF total 5L over last 2 days.  Now on RA. And 3 kg under dry wt. Still sig dependent edema which is chronic.   ESRD -  on HD MWF. Next HD today.  Volume overload - Edema improving.  Net UF 5L over last 2 days.  Under edw if weights correct.  Continue to titrate down volume as tolerated.    Hypertension - chronic hypotension on midodrine.    Anemia of CKD - Hgb 7- 8's, will start esa w/ darbe 100ug weekly, one dose today , then q Friday w/ HD while here.    Secondary Hyperparathyroidism -  Ca and phos in goal. Resumed phoslo as binder 2 ac tid. No VDRA noted.  Nutrition - Renal diet w/fluid restrictions. Diastolic HF - volume control with HD 11. DMT2 12. PAF - per chart plan for cardioversion later this month   Kelly Splinter, MD 01/22/2021, 1:47 PM

## 2021-01-22 NOTE — Evaluation (Signed)
Clinical/Bedside Swallow Evaluation Patient Details  Name: Jarques Vilorio MRN: [id number] Date of Birth: 05-23-1956  Today's Date: 01/22/2021 Time: SLP Start Time (ACUTE ONLY): 82 SLP Stop Time (ACUTE ONLY): 0930 SLP Time Calculation (min) (ACUTE ONLY): 15 min  Past Medical History:  Past Medical History:  Diagnosis Date   Diabetes mellitus without complication (HCC)    Diastolic heart failure (HCC)    ESRD (end stage renal disease) (HCC)    MI (myocardial infarction) ([place])    OSA (obstructive sleep apnea)    PAF (paroxysmal atrial fibrillation) ([place])    Renal disorder    on dialysis   Past Surgical History:  Past Surgical History:  Procedure Laterality Date   CORONARY ARTERY BYPASS GRAFT  2017   LIMA LAD, SVG PDA, OM3   DIALYSIS FISTULA CREATION     I & D EXTREMITY Right 01/20/2021   Procedure: IRRIGATION AND DEBRIDEMENT OF THIGH, APPLICATION OF WOUND [place];  Surgeon: Nicholes Stairs, MD;  Location: [facility];  Service: Orthopedics;  Laterality: Right;   TOE AMPUTATION Bilateral    HPI:  Pt is a 65 y.o male admitted to ED on 01/17/2021 with progressive somnolence and confusion with right leg pain and swelling. Pt with the working diagnosis of right lower extremity cellulitis, complicated with severe sepsis (metabolic encephalopathy).  Recent hospitalization 5/27-6/24 for RLE cellulitis. ETT 7/14- 7/15. 06/30/20: ([place]) SLP evaluation, FEES; Mild oropharyngeal; dysphagia with no aspiration observed; Recommendation: Regular diet consistency; Thin Liquids. PMH: end-stage renal disease on hemodialysis (MWF), anemia of CKD, type 2 DM, chronic diastolic heart failure, paroxysmal afib chronically anticoagulated on Eliquis, chronic hypoxic hypercapnic respiratory failure on 2 L continuous nasal cannula as well as nocturnal BiPAP, acquired hypothyroidism.   Assessment / Plan / Recommendation Clinical Impression  Pt seen for bedside swallow evaluation upright and alert in bed, eager  to resume PO diet. Oral mechanism examination unremarkable, except for some notable missing dentition. To note, basline vocal quality was hoarse, however pt stated it is getting closer to his normal. During initial interview to gather PLOF in terms of swallow/diet, pt O2 sats began dropping into the upper/mid 80s. Nasal cannula replaced properly and levels returned to upper 90s prior to PO trials. Thin liquids administered via cup and straw resulted in x1 instance of throat clearing and x1 instance of strong overt coughing, largely with straw sips. Initial consective straw sips were without s/sx of aspriation and small sips via straw after incidences were also unremarkable. All solid POs orally cleared and were without s/sx of aspiration. Recommend regular, thin liquid diet with staff to provide intermittent supervision for pt to take small bites/sips at slow rate. SLP to f/u for tolerance.  SLP Visit Diagnosis: Dysphagia, unspecified (R13.10)    Aspiration Risk  Mild aspiration risk    Diet Recommendation Thin liquid;Regular   Liquid Administration via: Cup;Straw Medication Administration: Whole meds with liquid Supervision: Patient able to self feed;Intermittent supervision to cue for compensatory strategies Compensations: Slow rate;Small sips/bites Postural Changes: Seated upright at 90 degrees    Other  Recommendations Oral Care Recommendations: Oral care BID;Staff/trained caregiver to provide oral care   Follow up Recommendations  (TBD)      Frequency and Duration min 2x/week  2 weeks       Prognosis Prognosis for Safe Diet Advancement: Good Barriers to Reach Goals: Cognitive deficits      Swallow Study   General Date of Onset: 01/21/21 HPI: Pt is a 65 y.o male admitted to ED  on 01/17/2021 with progressive somnolence and confusion with right leg pain and swelling. Pt with the working diagnosis of right lower extremity cellulitis, complicated with severe sepsis (metabolic  encephalopathy).  Recent hospitalization 5/27-6/24 for RLE cellulitis. ETT 7/14- 7/15. 06/30/20: ([place]) SLP evaluation, FEES; Mild oropharyngeal; dysphagia with no aspiration observed; Recommendation: Regular diet consistency; Thin Liquids. PMH: end-stage renal disease on hemodialysis (MWF), anemia of CKD, type 2 DM, chronic diastolic heart failure, paroxysmal afib chronically anticoagulated on Eliquis, chronic hypoxic hypercapnic respiratory failure on 2 L continuous nasal cannula as well as nocturnal BiPAP, acquired hypothyroidism. Type of Study: Bedside Swallow Evaluation Previous Swallow Assessment: see HPI Diet Prior to this Study: NPO Temperature Spikes Noted: Yes Respiratory Status: Nasal cannula History of Recent Intubation: Yes Length of Intubations (days): 1 days Date extubated: 01/21/21 Behavior/Cognition: Alert;Cooperative;Pleasant mood;Confused Oral Cavity Assessment: Within Functional Limits Oral Care Completed by SLP: No Oral Cavity - Dentition: Missing dentition Vision: Functional for self-feeding Self-Feeding Abilities: Able to feed self;Needs set up Patient Positioning: Upright in bed Baseline Vocal Quality: Hoarse Volitional Cough: Strong;Congested Volitional Swallow: Able to elicit    Oral/Motor/Sensory Function Overall Oral Motor/Sensory Function: Within functional limits   Ice Chips Ice chips: Not tested   Thin Liquid Thin Liquid: Impaired Presentation: Cup;Spoon Pharyngeal  Phase Impairments: Throat Clearing - Immediate;Cough - Immediate    Nectar Thick Nectar Thick Liquid: Not tested   Honey Thick Honey Thick Liquid: Not tested   Puree Puree: Within functional limits Presentation: Spoon   Solid     Solid: Within functional limits Presentation: [place], [place], [place] Office Number: [phone]  Acie Fredrickson 01/22/2021,9:55 AM

## 2021-01-22 NOTE — Progress Notes (Signed)
Occupational Therapy Evaluation Patient Details Name: Kyle Walton MRN: [id number] DOB: Dec 15, 1955 Today's Date: 01/22/2021    History of Present Illness 65 year old male admitted with somnolence and confusion; R thigh infection w/ pyomyositis + bilat lower leg cellulitis - sp ID R thigh abscess with placement of wound vac 7/14. Post op Acute respiratory failure with hypoxemia and hypercapnia. PMH: ESRD on HD (MWF), DM2, multiple toe amputations, morbid obesity, chronic diastolic heart failure, paroxysmal fibrillation, chronic hypoxic respiratory failure and hypothyroidism.  He is a nursing home resident.   Clinical Impression   PTA pt undergoing rehab at SNF, although pt states he has been having rehab at "[facility]". In April/May of 2022, pt was independent with ADL and mobility. Able to progress to EOB with total A +2 and required Max A at times to maintain sitting balance EOB due to posterior lean. Total A with LB ADL. RLE is very sensitive to touch. Began education on desensitization - encourage pt to rub RLE with wash cloth throughout the day. Will require use of Maximove/Sky lift to mobilize OOB. Recommend return to SNF for continued rehab. Will follow acutely.  BP supine 91/53; after return to supine form sitting EOB 85/50; 85/54 end of session - nsg made aware; Spo2 ranged form 85-90 on RA; HR 101-128    Follow Up Recommendations  SNF    Equipment Recommendations       Recommendations for Other Services       Precautions / Restrictions Precautions Precautions: Fall Precaution Comments: contact precautions; Also noteworthy that lateral aspect of R thigh is very painful and sensitive to even light touch, legally blind Restrictions Weight Bearing Restrictions: No Other Position/Activity Restrictions: wound vac R thigh      Mobility Bed Mobility Overal bed mobility: Needs Assistance Bed Mobility: Sit to Supine;Supine to Sit     Supine to sit: Total assist;+2 for physical  assistance;HOB elevated Sit to supine: Total assist;+2 for physical assistance   General bed mobility comments: pt assisted only with partially sliding LEs  to EOB, needed verbal and tactile cues for all mvmt. Total A at LE's and trunk for coming to sitting and returning to supine.    Transfers                 General transfer comment: unable to attempt due to poor tolerance of sitting EOB    Balance Overall balance assessment: Needs assistance Sitting-balance support: Feet supported Sitting balance-Leahy Scale: Poor Sitting balance - Comments: posterior lean in sitting, vc's to bring trunk fwd, pt cuold mantain only 3-5 secs before leaning back again Postural control: Posterior lean                                 ADL either performed or assessed with clinical judgement   ADL Overall ADL's : Needs assistance/impaired     Grooming: Minimal assistance;Sitting   Upper Body Bathing: Minimal assistance;Sitting;Bed level   Lower Body Bathing: Maximal assistance;Bed level   Upper Body Dressing : Moderate assistance;Bed level   Lower Body Dressing: Total assistance;Bed level               Functional mobility during ADLs:  (unable to attempt)       Vision Baseline Vision/History: Legally blind       Perception     Praxis      Pertinent Vitals/Pain Pain Assessment: 0-10 Pain Score: 9  Faces Pain Scale: Hurts  even more Pain Location: RLE knee to hip Pain Descriptors / Indicators: Grimacing;Guarding;Sharp Pain Intervention(s): Limited activity within patient's tolerance;Monitored during session;Repositioned     Hand Dominance Right   Extremity/Trunk Assessment Upper Extremity Assessment Upper Extremity Assessment: Defer to OT evaluation   Lower Extremity Assessment Lower Extremity Assessment: Generalized weakness;RLE deficits/detail;LLE deficits/detail RLE Deficits / Details: ROM limited by pain, pt able to move R ankle in partial df, full  pf, knee ext 2-/5 and was unable to reach full passive knee ext (lacking ~15 deg), hip flex 1/5, hip abd/add 2/5 RLE Sensation:  (hypersensitivity) RLE Coordination: decreased gross motor LLE Deficits / Details: grossly 3-/5 throughout LLE Sensation: WNL LLE Coordination: decreased gross motor   Cervical / Trunk Assessment Cervical / Trunk Assessment: Kyphotic   Communication Communication Communication: No difficulties   Cognition Arousal/Alertness: Awake/alert Behavior During Therapy: WFL for tasks assessed/performed Overall Cognitive Status: Impaired/Different from baseline Area of Impairment: Orientation;Attention;Memory;Following commands;Safety/judgement;Awareness;Problem solving                 Orientation Level: Time;Place;Situation Current Attention Level: Sustained Memory: Decreased short-term memory Following Commands: Follows one step commands consistently Safety/Judgement: Decreased awareness of safety;Decreased awareness of deficits Awareness: Intellectual Problem Solving: Difficulty sequencing;Requires verbal cues General Comments: pt confused and mental status worsened with exertion   General Comments  pt given washcloth to rub RLE gently to being desensitization. Bed placed in chair position with pillows behind pt's back for improved posture. Pt's O2 sats in 80's on RA and pt hypotensive. RN notified    Exercises Exercises: General Lower Extremity General Exercises - Lower Extremity Ankle Circles/Pumps: AROM;Both;10 reps;Supine Long Arc Quad: AROM;Both;Seated;5 reps   Shoulder Instructions      Home Living Family/patient expects to be discharged to:: Skilled nursing facility Living Arrangements: Spouse/significant other                               Additional Comments: pt from SNF, does not remember that he went to SNF after inpt rehab at [facility]      Prior Functioning/Environment Level of Independence: Needs assistance  Gait /  Transfers Assistance Needed: in April, pt ambulated with a rollator and used a hoveround. Thinks he has been ambulating 20' recently but question accuracy of this ADL's / Homemaking Assistance Needed: Previous note states pt was independent with ADL            OT Problem List: Decreased strength;Decreased range of motion;Decreased activity tolerance;Impaired balance (sitting and/or standing);Impaired vision/perception;Decreased cognition;Decreased safety awareness;Decreased knowledge of use of DME or AE;Cardiopulmonary status limiting activity;Impaired sensation;Obesity;Impaired UE functional use;Pain;Increased edema      OT Treatment/Interventions: Self-care/ADL training;Therapeutic exercise;Neuromuscular education;Energy conservation;DME and/or AE instruction;Therapeutic activities;Cognitive remediation/compensation;Patient/family education;Visual/perceptual remediation/compensation;Balance training    OT Goals(Current goals can be found in the care plan section) Acute Rehab OT Goals Patient Stated Goal: To be able to walk again OT Goal Formulation: Patient unable to participate in goal setting Time For Goal Achievement: 02/05/21 Potential to Achieve Goals: Fair  OT Frequency: Min 2X/week   Barriers to D/C:            Co-evaluation PT/OT/SLP Co-Evaluation/Treatment: Yes Reason for Co-Treatment: Complexity of the patient's impairments (multi-system involvement);Necessary to address cognition/behavior during functional activity;For patient/therapist safety PT goals addressed during session: Mobility/safety with mobility;Balance OT goals addressed during session: ADL's and self-care      AM-PAC OT "6 Clicks" Daily Activity     Outcome Measure Help from  another person eating meals?: A Little Help from another person taking care of personal grooming?: A Little Help from another person toileting, which includes using toliet, bedpan, or urinal?: Total Help from another person bathing  (including washing, rinsing, drying)?: A Lot Help from another person to put on and taking off regular upper body clothing?: A Lot Help from another person to put on and taking off regular lower body clothing?: Total 6 Click Score: 12   End of Session Nurse Communication: Mobility status;Need for lift equipment  Activity Tolerance: Patient limited by fatigue;Patient limited by pain Patient left: in bed;with call bell/phone within reach;with chair alarm set (chair position)  OT Visit Diagnosis: Other abnormalities of gait and mobility (R26.89);Muscle weakness (generalized) (M62.81);Other symptoms and signs involving cognitive function;Pain Pain - Right/Left: Right Pain - part of body: Leg                Time: [id number] OT Time Calculation (min): 39 min Charges:  OT General Charges $OT Visit: 1 Visit OT Evaluation $OT Eval Moderate Complexity: 1 Mod OT Treatments $Self Care/Home Management : 8-22 mins  Maurie Boettcher, OT/L   Acute OT Clinical Specialist [place] Pager [phone] Office [phone]   [facility] 01/22/2021, 1:38 PM

## 2021-01-22 NOTE — Progress Notes (Signed)
NAME:  Kyle Walton, MRN:  [id number], DOB:  [birth year], LOS: 4 ADMISSION DATE:  01/17/2021, CONSULTATION DATE:  01/18/2021 REFERRING MD:  Dr. Alvino Chapel, CHIEF COMPLAINT:  Hypotension    History of Present Illness:  65 year old male presents to ED on 7/11 with right leg pain and swelling. Recent hospitalization 5/27-6/24 for RLE cellulitis. On arrival to ED patient with hypoxia requiring 2L [place] and lethargic. ABG 7.195/52.4/60. Placed on BiPAP. CXR with central vascular congestion. WBC 20.7. Lactic Acid 1.2. Troponin 420. Crt 7.32. Given Cefepime/Vancomycin. BP on arrival 114/49 however during stay dropped to 89/45. Critical Care consulted.   On assessment patient is alert, states his BP normally runs [id number] systolic with 10 mg midodrine. Reports has not taken afternoon dose today.  Currently BP 92/55  Pertinent  Medical History  CAD/CABG, Diastolic HF, PAF/Flutter s/p AICD on Eliquis, DM, PVD s/p toe amputations, ESRD HD MWF, OSA on CPAP at HS, Chronic hypotension on Midodrine   Significant Hospital Events: Including procedures, antibiotic start and stop dates in addition to other pertinent events   7/12 admitted with encephalopathy and sepsis, started on vanc, ceftriaxone. HD on 7/12. 7/14> to OR for debridement of thigh hematoma> to ICU after intubated. Switched to vanc + zosyn 7/15 extubated  Interim History / Subjective:   Extubated 7/15 without incident Wore BiPAP overnight Hemodynamically stable Underwent HD on 7/15, I/O- 2.6 L total   Objective   Blood pressure (!) 93/53, pulse 93, temperature 99.1 F (37.3 C), temperature source Axillary, resp. rate 16, height '5\' 11"'$  (1.803 m), weight 113.9 kg, SpO2 100 %.    Vent Mode: BIPAP FiO2 (%):  [30 %-40 %] 40 % Set Rate:  [12 bmp] 12 bmp PEEP:  [5 cmH20] 5 cmH20 Pressure Support:  [12 cmH20] 12 cmH20   Intake/Output Summary (Last 24 hours) at 01/22/2021 0738 Last data filed at 01/22/2021 0100 Gross per 24 hour  Intake 277.52 ml   Output 2246 ml  Net -1968.48 ml   Filed Weights   01/19/21 1735 01/20/21 0430 01/21/21 0355  Weight: 115.5 kg 113.8 kg 113.9 kg    Examination: General: Obese man, laying in bed on room air, no distress HENT: Oropharynx clear, strong voice, no stridor Lungs: Decreased both bases, otherwise clear, no wheeze Cardiovascular: Irregularly irregular, no murmur Abdomen: Obese, nondistended, positive bowel sounds Extremities: Right thigh wound with VAC in place, JP drain in place with minimal bloody drainage.  Very tender to even light touch.  Trace edema Neuro: Awake, alert, appropriate, interacting, following commands  Resolved Hospital Problem list     Assessment & Plan:  Acute respiratory failure with hypoxia and hypercapnia, requiring MV post-operatively.  Extubated 7/15 Obstructive sleep apnea -Pulmonary hygiene -BiPAP ordered nightly.  Acute metabolic encephalopathy due to sepsis.  Improved -Limit any sedating medications  Cellulitis RLE causing severe sepsis.  Associated with hematoma MRSA PCR positive, sp debridement on 7/14, wound vac in place, blood in the JP drain Wound culture not yet mature, few gram-negative rods -Orthopedics managing.  He may need to go back to the OR next week for further debridement.  Anticoagulation and plavix on hold in expectation of this -Continue vancomycin and Zosyn for now, tailor to culture results when available -VAC and drain in place  History of chronic hypotension on midodrine; distributive shock due to sedation vs cytokine response to surgery.  Resolved -Continue scheduled midodrine -Holding his home metoprolol -continue midodrine 10 mg three times daily -hold metoprolol  Atrial fibrillation -Eliquis on  hold for now, restart when we are sure there will be no more surgical intervention -Restart amiodarone on 7/16 -digoxin held  ESRD On HD MWF -Appreciate nephrology management -HD as per MWF schedule -Follow  BMP  Hyperbilirubinemia due to sepsis, improved Hyperammonemia -Follow intermittently  Chronic anemia due to ESRD -transfuse for Hb<7 or hemodynamically significant bleeding  Diabetes- uncontrolled. A1c >9 -Sliding-scale insulin as per protocol -goal BG 140-180  Hypothyroidism -Home Synthroid  Best Practice (right click and "Reselect all SmartList Selections" daily)  Diet/type: PO DVT prophylaxis: SCD GI prophylaxis: PPI Lines: Dialysis Catheter Foley:  N/A Code Status:  full code Last date of multidisciplinary goals of care discussion [ N/A]  Labs   CBC: Recent Labs  Lab 01/18/21 0342 01/18/21 0344 01/19/21 0037 01/20/21 0706 01/20/21 1840 01/21/21 0613 01/22/21 0028  WBC 19.7*  --  19.1* 19.8*  --  22.3* 16.6*  NEUTROABS 16.5*  --  16.9* 17.0*  --  19.4* 14.2*  HGB 8.6*   < > 8.2* 7.9* 8.8* 8.0* 7.3*  HCT 27.8*   < > 26.1* 25.1* 26.0* 25.4* 23.2*  MCV 93.3  --  89.7 88.4  --  88.8 88.2  PLT 228  --  193 235  --  239 193   < > = values in this interval not displayed.    Basic Metabolic Panel: Recent Labs  Lab 01/18/21 0342 01/18/21 0344 01/18/21 0907 01/19/21 0037 01/20/21 0706 01/20/21 1840 01/21/21 0613 01/22/21 0028  NA 138   < >  --  137 137 136 136 138  K 4.4   < >  --  3.5 3.7 3.6 3.9 3.8  CL 102  --   --  101 96*  --  96* 101  CO2 17*  --   --  22 21*  --  17* 25  GLUCOSE 146*  --   --  140* 189*  --  149* 122*  BUN 56*  --   --  32* 21  --  30* 16  CREATININE 7.26*  --   --  5.10* 3.72*  --  4.67* 3.06*  CALCIUM 8.8*  --   --  8.3* 8.6*  --  8.7* 8.8*  MG 1.6*  --  1.6* 1.9  --   --   --   --   PHOS 5.3*  --   --  3.8  --   --   --   --    < > = values in this interval not displayed.   GFR: Estimated Creatinine Clearance: 30.9 mL/min (A) (by C-G formula based on SCr of 3.06 mg/dL (H)). Recent Labs  Lab 01/18/21 0342 01/18/21 0907 01/18/21 2105 01/19/21 0037 01/19/21 0038 01/19/21 0412 01/20/21 0706 01/21/21 0613 01/22/21 0028   PROCALCITON 1.24 1.30  --  1.60  --   --   --   --   --   WBC 19.7*  --   --  19.1*  --   --  19.8* 22.3* 16.6*  LATICACIDVEN  --  1.6  1.4 1.4  --  1.2 1.1  --   --   --     Liver Function Tests: Recent Labs  Lab 01/18/21 0342 01/19/21 0037 01/20/21 0706 01/21/21 0613 01/22/21 0028  AST '27 28 28 29 28  '$ ALT '8 8 9 6 9  '$ ALKPHOS 88 87 107 104 84  BILITOT 1.2 1.4* 1.5* 2.0* 1.6*  PROT 5.8* 6.1* 6.4* 5.7* 5.7*  ALBUMIN 2.1* 2.5* 2.3* 2.2*  2.3*   No results for input(s): LIPASE, AMYLASE in the last 168 hours. Recent Labs  Lab 01/18/21 0200  AMMONIA 38*     Baltazar Apo, MD, PhD 01/22/2021, 10:32 AM [facility] [phone] or if no answer before 7:00PM call [phone] For any issues after 7:00PM please call eLink [phone]

## 2021-01-23 DIAGNOSIS — G9340 Encephalopathy, unspecified: Secondary | ICD-10-CM | POA: Diagnosis not present

## 2021-01-23 DIAGNOSIS — D631 Anemia in chronic kidney disease: Secondary | ICD-10-CM | POA: Diagnosis present

## 2021-01-23 DIAGNOSIS — N189 Chronic kidney disease, unspecified: Secondary | ICD-10-CM | POA: Diagnosis present

## 2021-01-23 LAB — COMPREHENSIVE METABOLIC PANEL
ALT: 9 U/L (ref 0–44)
AST: 29 U/L (ref 15–41)
Albumin: 2.3 g/dL — ABNORMAL LOW (ref 3.5–5.0)
Alkaline Phosphatase: 95 U/L (ref 38–126)
Anion gap: 12 (ref 5–15)
BUN: 24 mg/dL — ABNORMAL HIGH (ref 8–23)
CO2: 28 mmol/L (ref 22–32)
Calcium: 9 mg/dL (ref 8.9–10.3)
Chloride: 100 mmol/L (ref 98–111)
Creatinine, Ser: 4.45 mg/dL — ABNORMAL HIGH (ref 0.61–1.24)
GFR, Estimated: 14 mL/min — ABNORMAL LOW (ref >60)
Glucose, Bld: 165 mg/dL — ABNORMAL HIGH (ref 70–99)
Potassium: 3.8 mmol/L (ref 3.5–5.1)
Sodium: 140 mmol/L (ref 135–145)
Total Bilirubin: 1.3 mg/dL — ABNORMAL HIGH (ref 0.3–1.2)
Total Protein: 6.5 g/dL (ref 6.5–8.1)

## 2021-01-23 LAB — CBC WITH DIFFERENTIAL/PLATELET
Abs Immature Granulocytes: 0.09 10*3/uL — ABNORMAL HIGH (ref 0.00–0.07)
Basophils Absolute: 0.1 10*3/uL (ref 0.0–0.1)
Basophils Relative: 0 %
Eosinophils Absolute: 0.2 10*3/uL (ref 0.0–0.5)
Eosinophils Relative: 2 %
HCT: 23.7 % — ABNORMAL LOW (ref 39.0–52.0)
Hemoglobin: 7.2 g/dL — ABNORMAL LOW (ref 13.0–17.0)
Immature Granulocytes: 1 %
Lymphocytes Relative: 5 %
Lymphs Abs: 0.8 10*3/uL (ref 0.7–4.0)
MCH: 27.8 pg (ref 26.0–34.0)
MCHC: 30.4 g/dL (ref 30.0–36.0)
MCV: 91.5 fL (ref 80.0–100.0)
Monocytes Absolute: 1.2 10*3/uL — ABNORMAL HIGH (ref 0.1–1.0)
Monocytes Relative: 8 %
Neutro Abs: 11.8 10*3/uL — ABNORMAL HIGH (ref 1.7–7.7)
Neutrophils Relative %: 84 %
Platelets: 215 10*3/uL (ref 150–400)
RBC: 2.59 MIL/uL — ABNORMAL LOW (ref 4.22–5.81)
RDW: 18.8 % — ABNORMAL HIGH (ref 11.5–15.5)
WBC: 14.1 10*3/uL — ABNORMAL HIGH (ref 4.0–10.5)
nRBC: 0 % (ref 0.0–0.2)

## 2021-01-23 LAB — GLUCOSE, CAPILLARY
Glucose-Capillary: 142 mg/dL — ABNORMAL HIGH (ref 70–99)
Glucose-Capillary: 152 mg/dL — ABNORMAL HIGH (ref 70–99)
Glucose-Capillary: 161 mg/dL — ABNORMAL HIGH (ref 70–99)
Glucose-Capillary: 175 mg/dL — ABNORMAL HIGH (ref 70–99)
Glucose-Capillary: 193 mg/dL — ABNORMAL HIGH (ref 70–99)
Glucose-Capillary: 207 mg/dL — ABNORMAL HIGH (ref 70–99)

## 2021-01-23 NOTE — Assessment & Plan Note (Signed)
Required continued MV post-operatively.   Extubated 7/15 --Pulmonary hygiene --BiPAP nightly

## 2021-01-23 NOTE — Assessment & Plan Note (Signed)
On HD MWF.  Nephrology following. --Follow BMP

## 2021-01-23 NOTE — Assessment & Plan Note (Addendum)
MRSA PCR positive. Debridement on 7/14.   Wound vac in place, JP drain. Follow wound culture growing SERRATIA MARCESCENS  On Cefepime (d/w Rph: sensitive but higher amp-c resistance) Mgmt per orthopedic surgery May need further debridement in OR next week  Anticoagulation and plavix on hold  Initially covered with empiric broad spectrum - Vancomycin and Zosyn

## 2021-01-23 NOTE — Assessment & Plan Note (Signed)
BiPAP nightly 

## 2021-01-23 NOTE — Assessment & Plan Note (Addendum)
PT recommending SNF. TOC following for placement.

## 2021-01-23 NOTE — Progress Notes (Signed)
PROGRESS NOTE    Kyle Walton   [id number]  DOB: January 18, 1956  PCP: Patient, No Pcp Per (Inactive)    DOA: 01/17/2021 LOS: 5   Assessment & Plan   Principal Problem:   Acute encephalopathy Active Problems:   Diabetes mellitus with peripheral vascular disease ([place])   History of DVT (deep vein thrombosis)   Essential hypertension   OSA (obstructive sleep apnea)   Paroxysmal atrial fibrillation (HCC)   ESRD on hemodialysis (HCC)   Impaired physical mobility   Acute on chronic respiratory failure with hypoxia and hypercapnia (HCC)   Cellulitis of right leg   Acute on chronic diastolic (congestive) heart failure (HCC)   Cellulitis   High anion gap metabolic acidosis   Hypotension   Acquired hypothyroidism   End-stage renal disease on hemodialysis (HCC)   Dry gangrene (HCC)   Sepsis, unspecified organism ([place])   Acquired hypothyroidism On Synthroid  Diabetes mellitus with peripheral vascular disease (HCC) Sliding scale Novolog Adjust insulin for inpatient goal 140-180  ESRD on hemodialysis ([place]) On HD MWF.  Nephrology following. --Follow BMP  Paroxysmal atrial fibrillation (HCC) --Eliquis on hold for now, restart when certain no more surgical intervention --Resumed on amiodarone 7/16 --Digoxin held    Hypotension Chronic.  Stable. Continue midodrine 10 mg TID Maintain MAP > 65 Hold metoprolol  Anemia of renal disease No evidence bleeding. Monitor CBC. Transfuse if Hbg<7 or hemodynamically significant bleeding.  Cellulitis of right leg MRSA PCR positive. Debridement on 7/14.   Wound vac in place, JP drain. Follow wound culture - prelim with few GNR's Mgmt per orthopedic surgery May need further debridement in OR next week  Anticoagulation and plavix on hold  Continue empiric Vancomycin and Zosyn - de-escalate based on cultures  Acute encephalopathy Due to Sepsis, severe illness.  Resolving. --Delirium precautions:     -Lights and TV off,  minimize interruptions at night    -Blinds open and lights on during day    -Glasses/hearing aid with patient    -Frequent reorientation    -PT/OT when able    -Avoid sedation medications as much as possible   Acute on chronic diastolic (congestive) heart failure (HCC) Appears close to euvolemia.   Monitor volume status closely.  Acute on chronic respiratory failure with hypoxia and hypercapnia (HCC) Required continued MV post-operatively.   Extubated 7/15 --Pulmonary hygiene --BiPAP nightly  OSA (obstructive sleep apnea) BiPAP nightly  History of DVT (deep vein thrombosis) Anticoagulation on hold in case of further procedures. SCDs for VTE ppx.  Impaired physical mobility PT eval    Distributive shock due to sedation vs cytokine response to surgery.  Resolved   Hyperbilirubinemia due to sepsis, improved Hyperammonemia -Follow intermittently  Obesity: Body mass index is 35.11 kg/m.  Complicates overall care and prognosis.  Recommend lifestyle modifications including physical activity and diet for weight loss and overall long-term health.   DVT prophylaxis:    Diet:  Diet Orders (From admission, onward)     Start     Ordered   01/22/21 0933  Diet regular Room service appropriate? Yes with Assist; Fluid consistency: Thin  Diet effective now       Question Answer Comment  Room service appropriate? Yes with Assist   Fluid consistency: Thin      01/22/21 0933              Code Status: Full Code   Brief Narrative / Hospital Course to Date:   "65 year old male presents to ED  on 7/11 with right leg pain and swelling. Recent hospitalization 5/27-6/24 for RLE cellulitis. On arrival to ED patient with hypoxia requiring 2L [place] and lethargic. ABG 7.195/52.4/60. Placed on BiPAP. CXR with central vascular congestion. WBC 20.7. Lactic Acid 1.2. Troponin 420. Crt 7.32. Given Cefepime/Vancomycin. BP on arrival 114/49 however during stay dropped to 89/45. Critical Care  consulted."  Subjective 01/23/21    Pt awake sitting up in bed this AM, pleasant with mild confusion.  Seen with ortho PA.  He reports significant pain in the right thigh.  No other acute complaints.   Significant events: 7/12 admitted with encephalopathy and sepsis, started on vanc, ceftriaxone. HD on 7/12. 7/14> to OR for debridement of thigh hematoma> to ICU after intubated. Switched to vanc + zosyn 7/15 extubated 7/17 transferred to [facility] service    Disposition Plan & Communication   Status is: Inpatient  Remains inpatient appropriate because:Inpatient level of care appropriate due to severity of illness  Dispo: The patient is from: Home              Anticipated d/c is to: Home              Patient currently is not medically stable to d/c.   Difficult to place patient No   Consults, Procedures, Significant Events   Consultants:  Orthopedic surgery  Procedures:  Surgical debridement, right thigh abscess' Wound vac placement Intubation / extubation Dialysis  Antimicrobials:  Anti-infectives (From admission, onward)    Start     Dose/Rate Route Frequency Ordered Stop   01/24/21 1200  vancomycin (VANCOCIN) IVPB 1000 mg/200 mL premix        1,000 mg 200 mL/hr over 60 Minutes Intravenous Every M-W-F (Hemodialysis) 01/21/21 1134     01/21/21 1430  Vancomycin (VANCOCIN) 1,250 mg in sodium chloride 0.9 % 250 mL IVPB        1,250 mg 166.7 mL/hr over 90 Minutes Intravenous Every Fri (Hemodialysis) 01/21/21 1342 01/21/21 1713   01/21/21 1230  vancomycin (VANCOCIN) 1,250 mg in sodium chloride 0.9 % 250 mL IVPB  Status:  Discontinued        1,250 mg 166.7 mL/hr over 90 Minutes Intravenous Every Fri (Hemodialysis) 01/21/21 1134 01/21/21 1335   01/20/21 1800  piperacillin-tazobactam (ZOSYN) IVPB 3.375 g  Status:  Discontinued        3.375 g 100 mL/hr over 30 Minutes Intravenous Every 6 hours 01/20/21 1504 01/20/21 1544   01/20/21 1615  ceFAZolin (ANCEF) IVPB 2g/100 mL premix   Status:  Discontinued        2 g 200 mL/hr over 30 Minutes Intravenous On call to O.R. 01/20/21 1532 01/20/21 1742   01/20/21 1600  piperacillin-tazobactam (ZOSYN) IVPB 2.25 g        2.25 g 100 mL/hr over 30 Minutes Intravenous Every 8 hours 01/20/21 1544     01/20/21 1542  ceFAZolin (ANCEF) 2-4 GM/100ML-% IVPB       Note to Pharmacy: Bobbie Stack   : cabinet override      01/20/21 1542 01/21/21 0359   01/19/21 1816  vancomycin (VANCOCIN) 1-5 GM/200ML-% IVPB       Note to Pharmacy: Louretta Shorten   : cabinet override      01/19/21 1816 01/20/21 0629   01/19/21 1200  vancomycin (VANCOCIN) IVPB 1000 mg/200 mL premix  Status:  Discontinued        1,000 mg 200 mL/hr over 60 Minutes Intravenous Every M-W-F (Hemodialysis) 01/19/21 [id number] 01/21/21 1134   01/18/21 0115  cefTRIAXone (ROCEPHIN) 1 g in sodium chloride 0.9 % 100 mL IVPB  Status:  Discontinued        1 g 200 mL/hr over 30 Minutes Intravenous Every 24 hours 01/18/21 0101 01/20/21 1504   01/17/21 2315  vancomycin (VANCOREADY) IVPB 2000 mg/400 mL        2,000 mg 200 mL/hr over 120 Minutes Intravenous  Once 01/17/21 2306 01/18/21 0147   01/17/21 2307  vancomycin variable dose per unstable renal function (pharmacist dosing)  Status:  Discontinued         Does not apply See admin instructions 01/17/21 2307 01/19/21 1013         Micro    Objective   Vitals:   01/23/21 1430 01/23/21 1500 01/23/21 1517 01/23/21 1600  BP: (!) 98/55 (!) 55/40  (!) 99/57  Pulse: 99 (!) 102  99  Resp: '16 18  19  '$ Temp:   99.1 F (37.3 C)   TempSrc:   Oral   SpO2: 94% (!) 87%  94%  Weight:      Height:        Intake/Output Summary (Last 24 hours) at 01/23/2021 1619 Last data filed at 01/23/2021 1600 Gross per 24 hour  Intake 273.98 ml  Output 50 ml  Net 223.98 ml   Filed Weights   01/20/21 0430 01/21/21 0355 01/23/21 0436  Weight: 113.8 kg 113.9 kg 114.2 kg    Physical Exam:  General exam: awake, alert, no acute distress,  obese Respiratory system: CTAB, no wheezes, rales or rhonchi, normal respiratory effort. Cardiovascular system: normal S1/S2, RRR, trace LE edema.   Gastrointestinal system: soft, NT, ND, +bowel sounds. Central nervous system: A&O. no gross focal neurologic deficits, normal speech Extremities: R lateral thigh with wound vac in place appears well sealed with serosanguinous fluid in vac canister., no edema, normal tone Psychiatry: normal mood, congruent affect, judgement and insight appear normal  Labs   Data Reviewed: I have personally reviewed following labs and imaging studies  CBC: Recent Labs  Lab 01/19/21 0037 01/20/21 0706 01/20/21 1840 01/21/21 0613 01/22/21 0028 01/23/21 0243  WBC 19.1* 19.8*  --  22.3* 16.6* 14.1*  NEUTROABS 16.9* 17.0*  --  19.4* 14.2* 11.8*  HGB 8.2* 7.9* 8.8* 8.0* 7.3* 7.2*  HCT 26.1* 25.1* 26.0* 25.4* 23.2* 23.7*  MCV 89.7 88.4  --  88.8 88.2 91.5  PLT 193 235  --  239 193 [id number]   Basic Metabolic Panel: Recent Labs  Lab 01/18/21 0342 01/18/21 0344 01/18/21 0907 01/19/21 0037 01/20/21 0706 01/20/21 1840 01/21/21 0613 01/22/21 0028 01/23/21 0243  NA 138   < >  --  137 137 136 136 138 140  K 4.4   < >  --  3.5 3.7 3.6 3.9 3.8 3.8  CL 102  --   --  101 96*  --  96* 101 100  CO2 17*  --   --  22 21*  --  17* 25 28  GLUCOSE 146*  --   --  140* 189*  --  149* 122* 165*  BUN 56*  --   --  32* 21  --  30* 16 24*  CREATININE 7.26*  --   --  5.10* 3.72*  --  4.67* 3.06* 4.45*  CALCIUM 8.8*  --   --  8.3* 8.6*  --  8.7* 8.8* 9.0  MG 1.6*  --  1.6* 1.9  --   --   --   --   --  PHOS 5.3*  --   --  3.8  --   --   --   --   --    < > = values in this interval not displayed.   GFR: Estimated Creatinine Clearance: 21.3 mL/min (A) (by C-G formula based on SCr of 4.45 mg/dL (H)). Liver Function Tests: Recent Labs  Lab 01/19/21 0037 01/20/21 0706 01/21/21 0613 01/22/21 0028 01/23/21 0243  AST '28 28 29 28 29  '$ ALT '8 9 6 9 9  '$ ALKPHOS 87 107 104 84 95   BILITOT 1.4* 1.5* 2.0* 1.6* 1.3*  PROT 6.1* 6.4* 5.7* 5.7* 6.5  ALBUMIN 2.5* 2.3* 2.2* 2.3* 2.3*   No results for input(s): LIPASE, AMYLASE in the last 168 hours. Recent Labs  Lab 01/18/21 0200  AMMONIA 61*   Coagulation Profile: Recent Labs  Lab 01/18/21 0342  INR 2.6*   Cardiac Enzymes: No results for input(s): CKTOTAL, CKMB, CKMBINDEX, TROPONINI in the last 168 hours. BNP (last 3 results) No results for input(s): PROBNP in the last 8760 hours. HbA1C: No results for input(s): HGBA1C in the last 72 hours. CBG: Recent Labs  Lab 01/22/21 2333 01/23/21 0323 01/23/21 0743 01/23/21 1123 01/23/21 1515  GLUCAP 145* 142* 161* 207* 152*   Lipid Profile: Recent Labs    01/21/21 0613  TRIG 163*   Thyroid Function Tests: No results for input(s): TSH, T4TOTAL, FREET4, T3FREE, THYROIDAB in the last 72 hours. Anemia Panel: No results for input(s): VITAMINB12, FOLATE, FERRITIN, TIBC, IRON, RETICCTPCT in the last 72 hours. Sepsis Labs: Recent Labs  Lab 01/18/21 0342 01/18/21 0907 01/18/21 2105 01/19/21 0037 01/19/21 0038 01/19/21 0412  PROCALCITON 1.24 1.30  --  1.60  --   --   LATICACIDVEN  --  1.6  1.4 1.4  --  1.2 1.1    Recent Results (from the past 240 hour(s))  Blood culture (routine x 2)     Status: None   Collection Time: 01/17/21  9:38 PM   Specimen: BLOOD  Result Value Ref Range Status   Specimen Description BLOOD RIGHT ARM  Final   Special Requests   Final    BOTTLES DRAWN AEROBIC AND ANAEROBIC Blood Culture adequate volume   Culture   Final    NO GROWTH 5 DAYS Performed at [facility], [address] [place], [place] [address]    Report Status 01/22/2021 FINAL  Final  Blood culture (routine x 2)     Status: None   Collection Time: 01/17/21  9:40 PM   Specimen: BLOOD  Result Value Ref Range Status   Specimen Description BLOOD SITE NOT SPECIFIED  Final   Special Requests   Final    BOTTLES DRAWN AEROBIC AND ANAEROBIC Blood Culture  adequate volume   Culture   Final    NO GROWTH 5 DAYS Performed at [facility], [address], [place], [place] [address]    Report Status 01/22/2021 FINAL  Final  Resp Panel by RT-PCR (Flu A&B, Covid) Nasopharyngeal Swab     Status: None   Collection Time: 01/17/21  9:50 PM   Specimen: Nasopharyngeal Swab; Nasopharyngeal(NP) swabs in vial transport medium  Result Value Ref Range Status   SARS Coronavirus 2 by RT PCR NEGATIVE NEGATIVE Final    Comment: (NOTE) SARS-CoV-2 target nucleic acids are NOT DETECTED.  The SARS-CoV-2 RNA is generally detectable in upper respiratory specimens during the acute phase of infection. The lowest concentration of SARS-CoV-2 viral copies this assay can detect is  138 copies/mL. A negative result does not preclude SARS-Cov-2 infection and should not be used as the sole basis for treatment or other patient management decisions. A negative result may occur with  improper specimen collection/handling, submission of specimen other than nasopharyngeal swab, presence of viral mutation(s) within the areas targeted by this assay, and inadequate number of viral copies(<138 copies/mL). A negative result must be combined with clinical observations, patient history, and epidemiological information. The expected result is Negative.  Fact Sheet for Patients:  EntrepreneurPulse.com.au  Fact Sheet for Healthcare Providers:  IncredibleEmployment.be  This test is no t yet approved or cleared by the [place] FDA and  has been authorized for detection and/or diagnosis of SARS-CoV-2 by FDA under an Emergency Use Authorization (EUA). This EUA will remain  in effect (meaning this test can be used) for the duration of the COVID-19 declaration under Section 564(b)(1) of the Act, 21 U.S.C.section 360bbb-3(b)(1), unless the authorization is terminated  or revoked sooner.       Influenza A by PCR NEGATIVE NEGATIVE Final    Influenza B by PCR NEGATIVE NEGATIVE Final    Comment: (NOTE) The Xpert Xpress SARS-CoV-2/FLU/RSV plus assay is intended as an aid in the diagnosis of influenza from Nasopharyngeal swab specimens and should not be used as a sole basis for treatment. Nasal washings and aspirates are unacceptable for Xpert Xpress SARS-CoV-2/FLU/RSV testing.  Fact Sheet for Patients: EntrepreneurPulse.com.au  Fact Sheet for Healthcare Providers: IncredibleEmployment.be  This test is not yet approved or cleared by the [place] FDA and has been authorized for detection and/or diagnosis of SARS-CoV-2 by FDA under an Emergency Use Authorization (EUA). This EUA will remain in effect (meaning this test can be used) for the duration of the COVID-19 declaration under Section 564(b)(1) of the Act, 21 U.S.C. section 360bbb-3(b)(1), unless the authorization is terminated or revoked.  Performed at [facility], [place] [address], [place], [place] [address]   Aerobic/Anaerobic Culture w Gram Stain (surgical/deep wound)     Status: None (Preliminary result)   Collection Time: 01/20/21  5:10 PM   Specimen: Wound; Abscess  Result Value Ref Range Status   Specimen Description ABSCESS  Final   Special Requests RIGHT THIGH ABSCESS SPEC A  Final   Gram Stain   Final    FEW WBC PRESENT,BOTH PMN AND MONONUCLEAR FEW GRAM NEGATIVE RODS    Culture   Final    CULTURE REINCUBATED FOR BETTER GROWTH FEW SERRATIA MARCESCENS NO ANAEROBES ISOLATED; CULTURE IN PROGRESS FOR 5 DAYS SUSCEPTIBILITIES TO FOLLOW Performed at [facility], [place] [address] [place], [place] [address]    Report Status PENDING  Incomplete  Surgical pcr screen     Status: Abnormal   Collection Time: 01/20/21  6:31 PM   Specimen: Nasal Mucosa; Nasal Swab  Result Value Ref Range Status   MRSA, PCR POSITIVE (A) NEGATIVE Final    Comment: RESULT CALLED TO, READ BACK BY AND VERIFIED WITH: SAVAGE RN '@2024'$   01/20/21 EB    Staphylococcus aureus POSITIVE (A) NEGATIVE Final    Comment: (NOTE) The Xpert SA Assay (FDA approved for NASAL specimens in patients 47 years of age and older), is one component of a comprehensive surveillance program. It is not intended to diagnose infection nor to guide or monitor treatment. Performed at [facility], [place] [address], [place], [place] [address]       Imaging Studies   No results found.   Medications   Scheduled Meds:  amiodarone  200 mg Oral BID   atorvastatin  40 mg Oral QPM   calcium acetate  1,334 mg Oral TID WC   Chlorhexidine Gluconate Cloth  6 each Topical Q0600   Chlorhexidine Gluconate Cloth  6 each Topical Q0600   [START ON 01/28/2021] darbepoetin (ARANESP) injection - DIALYSIS  100 mcg Intravenous Q Fri-HD   Gerhardt's butt cream   Topical BID   insulin aspart  0-6 Units Subcutaneous Q4H   levothyroxine  50 mcg Oral Q0600   midodrine  10 mg Oral TID WC   polyethylene glycol  17 g Oral Daily   Continuous Infusions:  piperacillin-tazobactam (ZOSYN)  IV Stopped (01/23/21 1551)   [START ON 01/24/2021] vancomycin         LOS: 5 days    Time spent: 30 minutes    Ezekiel Slocumb, DO [facility]  01/23/2021, 4:19 PM      If 7PM-7AM, please contact night-coverage. How to contact the [facility] Attending or Consulting provider [place] or covering provider during after hours [place], for this patient?    Check the care team in [facility] and look for a) attending/consulting [facility] provider listed and b) the [facility] team listed Log into [email or web address] and use [place]'s universal password to access. If you do not have the password, please contact the hospital operator. Locate the [facility] provider you are looking for under [facility] and page to a number that you can be directly reached. If you still have difficulty reaching the provider, please page the [facility] (Director on Call) for the Hospitalists listed on amion for  assistance.

## 2021-01-23 NOTE — Assessment & Plan Note (Signed)
--  Eliquis on hold for now, restart when certain no more surgical intervention --Resumed on amiodarone 7/16 --Digoxin held

## 2021-01-23 NOTE — Assessment & Plan Note (Signed)
Appears close to euvolemia.   Monitor volume status closely.

## 2021-01-23 NOTE — Assessment & Plan Note (Addendum)
Anticoagulation on hold in case of further procedures. SCDs for VTE ppx.

## 2021-01-23 NOTE — Assessment & Plan Note (Signed)
Due to Sepsis, severe illness.  Resolving. --Delirium precautions:     -Lights and TV off, minimize interruptions at night    -Blinds open and lights on during day    -Glasses/hearing aid with patient    -Frequent reorientation    -PT/OT when able    -Avoid sedation medications as much as possible

## 2021-01-23 NOTE — Assessment & Plan Note (Signed)
On Synthroid

## 2021-01-23 NOTE — Assessment & Plan Note (Signed)
No evidence bleeding. Monitor CBC. Transfuse if Hbg<7 or hemodynamically significant bleeding.

## 2021-01-23 NOTE — Progress Notes (Signed)
Subjective: 3 Days Post-Op Procedure(s) (LRB): IRRIGATION AND DEBRIDEMENT OF THIGH, APPLICATION OF WOUND VAC (Right) Patient reports pain as mild.   Patient seen in rounds for Dr. Stann Mainland. Patient is resting in bed this AM with attending physician at the bedside. 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 We discussed that Dr. Jonnie Finner latest note reports he no longer feels is the diagnosis. He reports his pain is better than prior to surgery, but he is still sore.    Objective: Vital signs in last 24 hours: Temp:  [97.2 F (36.2 C)-98.7 F (37.1 C)] 97.6 F (36.4 C) (07/17 0745) Pulse Rate:  [88-127] 97 (07/17 0930) Resp:  [13-30] 19 (07/17 0930) BP: (68-111)/(42-65) 94/42 (07/17 0930) SpO2:  [86 %-100 %] 95 % (07/17 0930) FiO2 (%):  [40 %] 40 % (07/17 0340) Weight:  [114.2 kg] 114.2 kg (07/17 0436)  Intake/Output from previous day:  Intake/Output Summary (Last 24 hours) at 01/23/2021 1017 Last data filed at 01/23/2021 0900 Gross per 24 hour  Intake 239.93 ml  Output 50 ml  Net 189.93 ml     Intake/Output this shift: Total I/O In: 124 [IV Piggyback:124] Out: -   Labs: Recent Labs    01/20/21 1840 01/21/21 0613 01/22/21 0028 01/23/21 0243  HGB 8.8* 8.0* 7.3* 7.2*   Recent Labs    01/22/21 0028 01/23/21 0243  WBC 16.6* 14.1*  RBC 2.63* 2.59*  HCT 23.2* 23.7*  PLT 193 215   Recent Labs    01/22/21 0028 01/23/21 0243  NA 138 140  K 3.8 3.8  CL 101 100  CO2 25 28  BUN 16 24*  CREATININE 3.06* 4.45*  GLUCOSE 122* 165*  CALCIUM 8.8* 9.0   No results for input(s): LABPT, INR in the last 72 hours.  Exam: General - Patient is Alert and Oriented Extremity - Neurologically intact Sensation intact distally Intact pulses distally Dorsiflexion/Plantar flexion intact Dressing - dressing C/D/I. Wound vac and JP drains in place.  Motor Function - intact, moving foot well on exam.   Past Medical History:  Diagnosis Date    Diabetes mellitus without complication (HCC)    Diastolic heart failure (HCC)    ESRD (end stage renal disease) (HCC)    MI (myocardial infarction) (HCC)    OSA (obstructive sleep apnea)    PAF (paroxysmal atrial fibrillation) (HCC)    Renal disorder    on dialysis    Assessment/Plan: 3 Days Post-Op Procedure(s) (LRB): IRRIGATION AND DEBRIDEMENT OF THIGH, APPLICATION OF WOUND VAC (Right) Principal Problem:   Acute encephalopathy Active Problems:   Diabetes mellitus with peripheral vascular disease (HCC)   History of DVT (deep vein thrombosis)   Essential hypertension   OSA (obstructive sleep apnea)   Paroxysmal atrial fibrillation (HCC)   ESRD on hemodialysis (HCC)   Impaired physical mobility   Acute on chronic respiratory failure with hypoxia and hypercapnia (HCC)   Cellulitis of right leg   Acute on chronic diastolic (congestive) heart failure (HCC)   Cellulitis   High anion gap metabolic acidosis   Hypotension   Acquired hypothyroidism   End-stage renal disease on hemodialysis (HCC)   Dry gangrene (HCC)   Sepsis, unspecified organism ([place])  Estimated body mass index is 35.11 kg/m as calculated from the following:   Height as of this encounter: '5\' 11"'$  (1.803 m).   Weight as of this encounter: 114.2 kg.  Assessment:  Right thigh pyomyositis with sepsis status post right thigh I&D.  VAC and  JP continue to drain.   Plan Continue with current drains.  Reassessment Monday by Dr. Stann Mainland for determination of further orthopedic care plan   Griffith Citron, PA-C Orthopedic Surgery [phone] 01/23/2021, 10:17 AM

## 2021-01-23 NOTE — Progress Notes (Signed)
[facility] Progress Note   Subjective:   Patient seen and examined at bedside in room.  No new c/o's.   Objective Vitals:   01/23/21 1430 01/23/21 1500 01/23/21 1517 01/23/21 1600  BP: (!) 98/55 (!) 55/40  (!) 99/57  Pulse: 99 (!) 102  99  Resp: '16 18  19  '$ Temp:   99.1 F (37.3 C)   TempSrc:   Oral   SpO2: 94% (!) 87%  94%  Weight:      Height:       Physical Exam General:chronically ill appearing male in NAD Heart:IRIR, no mrg Lungs:CTAB, nml WOB on RA Abdomen:soft, NTND Extremities:1-2+ edema b/l thighs r>L, multiple areas of erythema on b/l legs but improving, R thigh with wound vac in place Dialysis Access: [facility]   Filed Weights   01/20/21 0430 01/21/21 0355 01/23/21 0436  Weight: 113.8 kg 113.9 kg 114.2 kg    Intake/Output Summary (Last 24 hours) at 01/23/2021 1628 Last data filed at 01/23/2021 1600 Gross per 24 hour  Intake 273.98 ml  Output 50 ml  Net 223.98 ml     Additional Objective Labs: Basic Metabolic Panel: Recent Labs  Lab 01/18/21 0342 01/18/21 0344 01/19/21 0037 01/20/21 0706 01/21/21 0613 01/22/21 0028 01/23/21 0243  NA 138   < > 137   < > 136 138 140  K 4.4   < > 3.5   < > 3.9 3.8 3.8  CL 102  --  101   < > 96* 101 100  CO2 17*  --  22   < > 17* 25 28  GLUCOSE 146*  --  140*   < > 149* 122* 165*  BUN 56*  --  32*   < > 30* 16 24*  CREATININE 7.26*  --  5.10*   < > 4.67* 3.06* 4.45*  CALCIUM 8.8*  --  8.3*   < > 8.7* 8.8* 9.0  PHOS 5.3*  --  3.8  --   --   --   --    < > = values in this interval not displayed.    Liver Function Tests: Recent Labs  Lab 01/21/21 0613 01/22/21 0028 01/23/21 0243  AST '29 28 29  '$ ALT '6 9 9  '$ ALKPHOS 104 84 95  BILITOT 2.0* 1.6* 1.3*  PROT 5.7* 5.7* 6.5  ALBUMIN 2.2* 2.3* 2.3*    CBC: Recent Labs  Lab 01/19/21 0037 01/20/21 0706 01/20/21 1840 01/21/21 0613 01/22/21 0028 01/23/21 0243  WBC 19.1* 19.8*  --  22.3* 16.6* 14.1*  NEUTROABS 16.9* 17.0*  --  19.4* 14.2* 11.8*   HGB 8.2* 7.9*   < > 8.0* 7.3* 7.2*  HCT 26.1* 25.1*   < > 25.4* 23.2* 23.7*  MCV 89.7 88.4  --  88.8 88.2 91.5  PLT 193 235  --  239 193 215   < > = values in this interval not displayed.    Blood Culture    Component Value Date/Time   SDES ABSCESS 01/20/2021 1710   SPECREQUEST RIGHT THIGH ABSCESS SPEC A 01/20/2021 1710   CULT  01/20/2021 1710    CULTURE REINCUBATED FOR BETTER GROWTH FEW SERRATIA MARCESCENS NO ANAEROBES ISOLATED; CULTURE IN PROGRESS FOR 5 DAYS SUSCEPTIBILITIES TO FOLLOW Performed at [facility], [place] [address], [place], [place] [address]    REPTSTATUS PENDING 01/20/2021 1710    CBG: Recent Labs  Lab 01/22/21 2333 01/23/21 0323 01/23/21 0743 01/23/21 1123 01/23/21 1515  GLUCAP 145* 142* 161*  207* 152*     Studies/Results: No results found.  Medications:  piperacillin-tazobactam (ZOSYN)  IV Stopped (01/23/21 1551)   [START ON 01/24/2021] vancomycin      amiodarone  200 mg Oral BID   atorvastatin  40 mg Oral QPM   calcium acetate  1,334 mg Oral TID WC   Chlorhexidine Gluconate Cloth  6 each Topical Q0600   Chlorhexidine Gluconate Cloth  6 each Topical Q0600   [START ON 01/28/2021] darbepoetin (ARANESP) injection - DIALYSIS  100 mcg Intravenous Q Fri-HD   Gerhardt's butt cream   Topical BID   insulin aspart  0-6 Units Subcutaneous Q4H   levothyroxine  50 mcg Oral Q0600   midodrine  10 mg Oral TID WC   polyethylene glycol  17 g Oral Daily    Dialysis Orders: MWF at [facility] 4hr, 450/800, EDW 117kg, 3K/2.5Ca, TDC, heparin 4900 bolus + 500/hr    Assessment/Plan:  R thigh infection w/ abscess/ pyomyositis + bilat lower leg cellulitis - sp ID on 7/14 by orthopedics. 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 Have dc'd [facility] and resumed the phoslo.  Acute encephalopathy - Improved. Back to baseline.  Acute on chronic hypercapnic respiratory failure - Improved. CXR with volume  overload on admit. UF total 7L over last 3 HD sessions.  Now on RA, under dry wt.   ESRD -  on HD MWF. Next HD today.  Volume overload - Edema improved some.  Net UF 5L over last 2 days.  Under edw if weights correct.  Continue to titrate down volume as tolerated.    BP - hx of  chronic hypotension on midodrine 10 tid    Anemia of CKD - Hgb 7- 8's, will start esa w/ darbe 100ug weekly, one dose today , then q Friday w/ HD while here.    Secondary Hyperparathyroidism -  Ca and phos in goal. Resumed phoslo as binder 2 ac tid. No VDRA noted.  Nutrition - Renal diet w/fluid restrictions. Diastolic HF - volume control with HD 11. DMT2 12. PAF - per chart plan for cardioversion later this month   Kelly Splinter, MD 01/23/2021, 4:28 PM

## 2021-01-23 NOTE — Assessment & Plan Note (Signed)
Chronic.  Stable. Continue midodrine 10 mg TID Maintain MAP > 65 Hold metoprolol

## 2021-01-23 NOTE — Assessment & Plan Note (Addendum)
Sliding scale Novolog Adjust insulin for inpatient goal 140-180 Resume home Lyrica for peripheral neuropathy

## 2021-01-24 DIAGNOSIS — G9340 Encephalopathy, unspecified: Secondary | ICD-10-CM | POA: Diagnosis not present

## 2021-01-24 LAB — CBC WITH DIFFERENTIAL/PLATELET
Abs Immature Granulocytes: 0.11 10*3/uL — ABNORMAL HIGH (ref 0.00–0.07)
Basophils Absolute: 0.1 10*3/uL (ref 0.0–0.1)
Basophils Relative: 0 %
Eosinophils Absolute: 0.2 10*3/uL (ref 0.0–0.5)
Eosinophils Relative: 2 %
HCT: 23.9 % — ABNORMAL LOW (ref 39.0–52.0)
Hemoglobin: 7.3 g/dL — ABNORMAL LOW (ref 13.0–17.0)
Immature Granulocytes: 1 %
Lymphocytes Relative: 7 %
Lymphs Abs: 1 10*3/uL (ref 0.7–4.0)
MCH: 27.9 pg (ref 26.0–34.0)
MCHC: 30.5 g/dL (ref 30.0–36.0)
MCV: 91.2 fL (ref 80.0–100.0)
Monocytes Absolute: 1.2 10*3/uL — ABNORMAL HIGH (ref 0.1–1.0)
Monocytes Relative: 9 %
Neutro Abs: 11 10*3/uL — ABNORMAL HIGH (ref 1.7–7.7)
Neutrophils Relative %: 81 %
Platelets: 210 10*3/uL (ref 150–400)
RBC: 2.62 MIL/uL — ABNORMAL LOW (ref 4.22–5.81)
RDW: 18.7 % — ABNORMAL HIGH (ref 11.5–15.5)
WBC: 13.5 10*3/uL — ABNORMAL HIGH (ref 4.0–10.5)
nRBC: 0 % (ref 0.0–0.2)

## 2021-01-24 LAB — METHYLMALONIC ACID, SERUM: Methylmalonic Acid, Quantitative: 655 nmol/L — ABNORMAL HIGH (ref 0–378)

## 2021-01-24 LAB — GLUCOSE, CAPILLARY
Glucose-Capillary: 155 mg/dL — ABNORMAL HIGH (ref 70–99)
Glucose-Capillary: 137 mg/dL — ABNORMAL HIGH (ref 70–99)
Glucose-Capillary: 193 mg/dL — ABNORMAL HIGH (ref 70–99)
Glucose-Capillary: 175 mg/dL — ABNORMAL HIGH (ref 70–99)

## 2021-01-24 LAB — BASIC METABOLIC PANEL
Anion gap: 13 (ref 5–15)
Anion gap: 12 (ref 5–15)
BUN: 28 mg/dL — ABNORMAL HIGH (ref 8–23)
BUN: 16 mg/dL (ref 8–23)
CO2: 25 mmol/L (ref 22–32)
CO2: 27 mmol/L (ref 22–32)
Calcium: 8.6 mg/dL — ABNORMAL LOW (ref 8.9–10.3)
Calcium: 8.5 mg/dL — ABNORMAL LOW (ref 8.9–10.3)
Chloride: 101 mmol/L (ref 98–111)
Chloride: 97 mmol/L — ABNORMAL LOW (ref 98–111)
Creatinine, Ser: 5.51 mg/dL — ABNORMAL HIGH (ref 0.61–1.24)
Creatinine, Ser: 3.7 mg/dL — ABNORMAL HIGH (ref 0.61–1.24)
GFR, Estimated: 11 mL/min — ABNORMAL LOW (ref >60)
GFR, Estimated: 17 mL/min — ABNORMAL LOW (ref >60)
Glucose, Bld: 146 mg/dL — ABNORMAL HIGH (ref 70–99)
Glucose, Bld: 185 mg/dL — ABNORMAL HIGH (ref 70–99)
Potassium: 3.6 mmol/L (ref 3.5–5.1)
Potassium: 3.5 mmol/L (ref 3.5–5.1)
Sodium: 139 mmol/L (ref 135–145)
Sodium: 136 mmol/L (ref 135–145)

## 2021-01-24 LAB — MAGNESIUM: Magnesium: 1.8 mg/dL (ref 1.7–2.4)

## 2021-01-24 LAB — TRIGLYCERIDES: Triglycerides: 118 mg/dL (ref <150)

## 2021-01-24 MED ORDER — CEFAZOLIN SODIUM-DEXTROSE 2-4 GM/100ML-% IV SOLN
2.0000 g | Freq: Three times a day (TID) | INTRAVENOUS | Status: DC
  Administered 2021-01-24: 2 g via INTRAVENOUS
  Filled 2021-01-24: qty 100

## 2021-01-24 MED ORDER — LACTATED RINGERS IV BOLUS
500.0000 mL | Freq: Once | INTRAVENOUS | Status: AC
  Administered 2021-01-24: 500 mL via INTRAVENOUS

## 2021-01-24 MED ORDER — PREGABALIN 25 MG PO CAPS
25.0000 mg | ORAL_CAPSULE | Freq: Two times a day (BID) | ORAL | Status: DC
  Administered 2021-01-24 – 2021-01-28 (×7): 25 mg via ORAL
  Filled 2021-01-24 (×7): qty 1

## 2021-01-24 MED ORDER — VANCOMYCIN HCL IN DEXTROSE 1-5 GM/200ML-% IV SOLN
INTRAVENOUS | Status: AC
  Administered 2021-01-24: 1000 mg via INTRAVENOUS
  Filled 2021-01-24: qty 200

## 2021-01-24 MED ORDER — SODIUM CHLORIDE 0.9 % IV SOLN
2.0000 g | INTRAVENOUS | Status: DC
  Administered 2021-01-24: 2 g via INTRAVENOUS
  Filled 2021-01-24: qty 20

## 2021-01-24 MED ORDER — HEPARIN SODIUM (PORCINE) 1000 UNIT/ML IJ SOLN
INTRAMUSCULAR | Status: AC
  Administered 2021-01-24: 1000 [IU] via INTRAVENOUS_CENTRAL
  Filled 2021-01-24: qty 4

## 2021-01-24 MED ORDER — SODIUM CHLORIDE 0.9 % IV SOLN: 2.0000 g | INTRAVENOUS | Status: DC

## 2021-01-24 NOTE — Progress Notes (Signed)
PROGRESS NOTE    Kyle Walton   [id number]  DOB: Jan 12, 1956  PCP: Patient, No Pcp Per (Inactive)    DOA: 01/17/2021 LOS: 6   Assessment & Plan   Principal Problem:   Acute encephalopathy Active Problems:   Diabetes mellitus with peripheral vascular disease ([place])   History of DVT (deep vein thrombosis)   OSA (obstructive sleep apnea)   Paroxysmal atrial fibrillation (HCC)   ESRD on hemodialysis (HCC)   Impaired physical mobility   Acute on chronic respiratory failure with hypoxia and hypercapnia (HCC)   Cellulitis of right leg   Acute on chronic diastolic (congestive) heart failure (HCC)   High anion gap metabolic acidosis   Hypotension   Acquired hypothyroidism   Dry gangrene (HCC)   Sepsis, unspecified organism ([place])   Anemia of renal disease   Acquired hypothyroidism On Synthroid  Diabetes mellitus with peripheral vascular disease (HCC) Sliding scale Novolog Adjust insulin for inpatient goal 140-180  ESRD on hemodialysis ([place]) On HD MWF.  Nephrology following. --Follow BMP  Paroxysmal atrial fibrillation (HCC) --Eliquis on hold for now, restart when certain no more surgical intervention --Resumed on amiodarone 7/16 --Digoxin held    Hypotension Chronic.  Stable. Continue midodrine 10 mg TID Maintain MAP > 65 Hold metoprolol  Anemia of renal disease No evidence bleeding. Monitor CBC. Transfuse if Hbg<7 or hemodynamically significant bleeding.  Cellulitis of right leg MRSA PCR positive. Debridement on 7/14.   Wound vac in place, JP drain. Follow wound culture growing SERRATIA MARCESCENS sensitive to Ancef Mgmt per orthopedic surgery May need further debridement in OR next week  Anticoagulation and plavix on hold  Initially covered with empiric broad spectrum - Vancomycin and Zosyn  Transition to Ancef (7/18)   Acute encephalopathy Due to Sepsis, severe illness.  Resolving. --Delirium precautions:     -Lights and TV off, minimize  interruptions at night    -Blinds open and lights on during day    -Glasses/hearing aid with patient    -Frequent reorientation    -PT/OT when able    -Avoid sedation medications as much as possible   Acute on chronic diastolic (congestive) heart failure (HCC) Appears close to euvolemia.   Monitor volume status closely.  Acute on chronic respiratory failure with hypoxia and hypercapnia (HCC) Required continued MV post-operatively.   Extubated 7/15 --Pulmonary hygiene --BiPAP nightly  OSA (obstructive sleep apnea) BiPAP nightly  History of DVT (deep vein thrombosis) Anticoagulation on hold in case of further procedures. SCDs for VTE ppx.  Impaired physical mobility PT recommending SNF. TOC following for placement.    Distributive shock due to sedation vs cytokine response to surgery.  Resolved   Hyperbilirubinemia due to sepsis, improved Hyperammonemia -Follow intermittently  Obesity: Body mass index is 34.71 kg/m.  Complicates overall care and prognosis.  Recommend lifestyle modifications including physical activity and diet for weight loss and overall long-term health.   DVT prophylaxis:    Diet:  Diet Orders (From admission, onward)     Start     Ordered   01/22/21 0933  Diet regular Room service appropriate? Yes with Assist; Fluid consistency: Thin  Diet effective now       Question Answer Comment  Room service appropriate? Yes with Assist   Fluid consistency: Thin      01/22/21 0933              Code Status: Full Code   Brief Narrative / Hospital Course to Date:   "65 year old male  presents to ED on 7/11 with right leg pain and swelling. Recent hospitalization 5/27-6/24 for RLE cellulitis. On arrival to ED patient with hypoxia requiring 2L [place] and lethargic. ABG 7.195/52.4/60. Placed on BiPAP. CXR with central vascular congestion. WBC 20.7. Lactic Acid 1.2. Troponin 420. Crt 7.32. Given Cefepime/Vancomycin. BP on arrival 114/49 however during stay  dropped to 89/45. Critical Care consulted."  Subjective 01/24/21    Pt awake sitting up in bed this AM, pleasant with mild confusion.  Seen with ortho PA.  He reports significant pain in the right thigh.  No other acute complaints.   Significant events: 7/12 admitted with encephalopathy and sepsis, started on vanc, ceftriaxone. HD on 7/12. 7/14> to OR for debridement of thigh hematoma> to ICU after intubated. Switched to vanc + zosyn 7/15 extubated 7/17 transferred to [facility] service    Disposition Plan & Communication   Status is: Inpatient  Remains inpatient appropriate because:Inpatient level of care appropriate due to severity of illness  Dispo: The patient is from: Home              Anticipated d/c is to: Home              Patient currently is not medically stable to d/c.   Difficult to place patient No   Consults, Procedures, Significant Events   Consultants:  Orthopedic surgery  Procedures:  Surgical debridement, right thigh abscess' Wound vac placement Intubation / extubation Dialysis  Antimicrobials:  Anti-infectives (From admission, onward)    Start     Dose/Rate Route Frequency Ordered Stop   01/24/21 1615  cefTRIAXone (ROCEPHIN) 2 g in sodium chloride 0.9 % 100 mL IVPB  Status:  Discontinued        2 g 200 mL/hr over 30 Minutes Intravenous Every 24 hours 01/24/21 1610 01/24/21 1612   01/24/21 1615  ceFAZolin (ANCEF) IVPB 2g/100 mL premix        2 g 200 mL/hr over 30 Minutes Intravenous Every 8 hours 01/24/21 1612     01/24/21 1200  vancomycin (VANCOCIN) IVPB 1000 mg/200 mL premix  Status:  Discontinued        1,000 mg 200 mL/hr over 60 Minutes Intravenous Every M-W-F (Hemodialysis) 01/21/21 1134 01/24/21 1610   01/21/21 1430  Vancomycin (VANCOCIN) 1,250 mg in sodium chloride 0.9 % 250 mL IVPB        1,250 mg 166.7 mL/hr over 90 Minutes Intravenous Every Fri (Hemodialysis) 01/21/21 1342 01/21/21 1713   01/21/21 1230  vancomycin (VANCOCIN) 1,250 mg in sodium  chloride 0.9 % 250 mL IVPB  Status:  Discontinued        1,250 mg 166.7 mL/hr over 90 Minutes Intravenous Every Fri (Hemodialysis) 01/21/21 1134 01/21/21 1335   01/20/21 1800  piperacillin-tazobactam (ZOSYN) IVPB 3.375 g  Status:  Discontinued        3.375 g 100 mL/hr over 30 Minutes Intravenous Every 6 hours 01/20/21 1504 01/20/21 1544   01/20/21 1615  ceFAZolin (ANCEF) IVPB 2g/100 mL premix  Status:  Discontinued        2 g 200 mL/hr over 30 Minutes Intravenous On call to O.R. 01/20/21 1532 01/20/21 1742   01/20/21 1600  piperacillin-tazobactam (ZOSYN) IVPB 2.25 g  Status:  Discontinued        2.25 g 100 mL/hr over 30 Minutes Intravenous Every 8 hours 01/20/21 1544 01/24/21 1610   01/20/21 1542  ceFAZolin (ANCEF) 2-4 GM/100ML-% IVPB       Note to Pharmacy: Bobbie Stack   : cabinet  override      01/20/21 1542 01/21/21 0359   01/19/21 1816  vancomycin (VANCOCIN) 1-5 GM/200ML-% IVPB       Note to Pharmacy: Louretta Shorten   : cabinet override      01/19/21 1816 01/20/21 0629   01/19/21 1200  vancomycin (VANCOCIN) IVPB 1000 mg/200 mL premix  Status:  Discontinued        1,000 mg 200 mL/hr over 60 Minutes Intravenous Every M-W-F (Hemodialysis) 01/19/21 0936 01/21/21 1134   01/18/21 0115  cefTRIAXone (ROCEPHIN) 1 g in sodium chloride 0.9 % 100 mL IVPB  Status:  Discontinued        1 g 200 mL/hr over 30 Minutes Intravenous Every 24 hours 01/18/21 0101 01/20/21 1504   01/17/21 2315  vancomycin (VANCOREADY) IVPB 2000 mg/400 mL        2,000 mg 200 mL/hr over 120 Minutes Intravenous  Once 01/17/21 2306 01/18/21 0147   01/17/21 2307  vancomycin variable dose per unstable renal function (pharmacist dosing)  Status:  Discontinued         Does not apply See admin instructions 01/17/21 2307 01/19/21 1013         Micro    Objective   Vitals:   01/24/21 1430 01/24/21 1500 01/24/21 1530 01/24/21 1600  BP: 108/67 (!) 116/59 113/62 102/63  Pulse: 88 93 86 87  Resp: '17 18 12 15  '$ Temp:       TempSrc:      SpO2:      Weight:      Height:        Intake/Output Summary (Last 24 hours) at 01/24/2021 1612 Last data filed at 01/24/2021 0400 Gross per 24 hour  Intake 20 ml  Output 45 ml  Net -25 ml   Filed Weights   01/23/21 0436 01/24/21 0500 01/24/21 1230  Weight: 114.2 kg 115 kg 112.9 kg    Physical Exam:  General exam: in dialysis, awake, alert, no acute distress, obese Respiratory system: normal respiratory effort, on 2 L/min oxygen. Cardiovascular system: normal S1/S2, RRR, 1-2+ LE edema.   Central nervous system: A&O. no gross focal neurologic deficits, normal speech Extremities: R lateral thigh with wound vac in place appears well seale, 1-2+ lower extremity edema, normal tone Psychiatry: normal mood, congruent affect  Labs   Data Reviewed: I have personally reviewed following labs and imaging studies  CBC: Recent Labs  Lab 01/20/21 0706 01/20/21 1840 01/21/21 0613 01/22/21 0028 01/23/21 0243 01/24/21 0624  WBC 19.8*  --  22.3* 16.6* 14.1* 13.5*  NEUTROABS 17.0*  --  19.4* 14.2* 11.8* 11.0*  HGB 7.9* 8.8* 8.0* 7.3* 7.2* 7.3*  HCT 25.1* 26.0* 25.4* 23.2* 23.7* 23.9*  MCV 88.4  --  88.8 88.2 91.5 91.2  PLT 235  --  239 193 215 [id number]   Basic Metabolic Panel: Recent Labs  Lab 01/18/21 0342 01/18/21 0344 01/18/21 0907 01/19/21 0037 01/20/21 0706 01/20/21 1840 01/21/21 0613 01/22/21 0028 01/23/21 0243 01/24/21 0624  NA 138   < >  --  137 137 136 136 138 140 139  K 4.4   < >  --  3.5 3.7 3.6 3.9 3.8 3.8 3.6  CL 102  --   --  101 96*  --  96* 101 100 101  CO2 17*  --   --  22 21*  --  17* '25 28 25  '$ GLUCOSE 146*  --   --  140* 189*  --  149* 122* 165* 146*  BUN  56*  --   --  32* 21  --  30* 16 24* 28*  CREATININE 7.26*  --   --  5.10* 3.72*  --  4.67* 3.06* 4.45* 5.51*  CALCIUM 8.8*  --   --  8.3* 8.6*  --  8.7* 8.8* 9.0 8.6*  MG 1.6*  --  1.6* 1.9  --   --   --   --   --   --   PHOS 5.3*  --   --  3.8  --   --   --   --   --   --    < > = values  in this interval not displayed.   GFR: Estimated Creatinine Clearance: 17.1 mL/min (A) (by C-G formula based on SCr of 5.51 mg/dL (H)). Liver Function Tests: Recent Labs  Lab 01/19/21 0037 01/20/21 0706 01/21/21 0613 01/22/21 0028 01/23/21 0243  AST '28 28 29 28 29  '$ ALT '8 9 6 9 9  '$ ALKPHOS 87 107 104 84 95  BILITOT 1.4* 1.5* 2.0* 1.6* 1.3*  PROT 6.1* 6.4* 5.7* 5.7* 6.5  ALBUMIN 2.5* 2.3* 2.2* 2.3* 2.3*   No results for input(s): LIPASE, AMYLASE in the last 168 hours. Recent Labs  Lab 01/18/21 0200  AMMONIA 61*   Coagulation Profile: Recent Labs  Lab 01/18/21 0342  INR 2.6*   Cardiac Enzymes: No results for input(s): CKTOTAL, CKMB, CKMBINDEX, TROPONINI in the last 168 hours. BNP (last 3 results) No results for input(s): PROBNP in the last 8760 hours. HbA1C: No results for input(s): HGBA1C in the last 72 hours. CBG: Recent Labs  Lab 01/23/21 1926 01/23/21 2342 01/24/21 0327 01/24/21 0734 01/24/21 1136  GLUCAP 193* 175* 155* 137* 193*   Lipid Profile: Recent Labs    01/24/21 0624  TRIG 118   Thyroid Function Tests: No results for input(s): TSH, T4TOTAL, FREET4, T3FREE, THYROIDAB in the last 72 hours. Anemia Panel: No results for input(s): VITAMINB12, FOLATE, FERRITIN, TIBC, IRON, RETICCTPCT in the last 72 hours. Sepsis Labs: Recent Labs  Lab 01/18/21 0342 01/18/21 0907 01/18/21 2105 01/19/21 0037 01/19/21 0038 01/19/21 0412  PROCALCITON 1.24 1.30  --  1.60  --   --   LATICACIDVEN  --  1.6  1.4 1.4  --  1.2 1.1    Recent Results (from the past 240 hour(s))  Blood culture (routine x 2)     Status: None   Collection Time: 01/17/21  9:38 PM   Specimen: BLOOD  Result Value Ref Range Status   Specimen Description BLOOD RIGHT ARM  Final   Special Requests   Final    BOTTLES DRAWN AEROBIC AND ANAEROBIC Blood Culture adequate volume   Culture   Final    NO GROWTH 5 DAYS Performed at [facility], [address], [place], [place] [address]     Report Status 01/22/2021 FINAL  Final  Blood culture (routine x 2)     Status: None   Collection Time: 01/17/21  9:40 PM   Specimen: BLOOD  Result Value Ref Range Status   Specimen Description BLOOD SITE NOT SPECIFIED  Final   Special Requests   Final    BOTTLES DRAWN AEROBIC AND ANAEROBIC Blood Culture adequate volume   Culture   Final    NO GROWTH 5 DAYS Performed at [facility], [address] [place] [address], [place], [place] [address]    Report Status 01/22/2021 FINAL  Final  Resp Panel by RT-PCR (Flu A&B, Covid) Nasopharyngeal Swab  Status: None   Collection Time: 01/17/21  9:50 PM   Specimen: Nasopharyngeal Swab; Nasopharyngeal(NP) swabs in vial transport medium  Result Value Ref Range Status   SARS Coronavirus 2 by RT PCR NEGATIVE NEGATIVE Final    Comment: (NOTE) SARS-CoV-2 target nucleic acids are NOT DETECTED.  The SARS-CoV-2 RNA is generally detectable in upper respiratory specimens during the acute phase of infection. The lowest concentration of SARS-CoV-2 viral copies this assay can detect is 138 copies/mL. A negative result does not preclude SARS-Cov-2 infection and should not be used as the sole basis for treatment or other patient management decisions. A negative result may occur with  improper specimen collection/handling, submission of specimen other than nasopharyngeal swab, presence of viral mutation(s) within the areas targeted by this assay, and inadequate number of viral copies(<138 copies/mL). A negative result must be combined with clinical observations, patient history, and epidemiological information. The expected result is Negative.  Fact Sheet for Patients:  EntrepreneurPulse.com.au  Fact Sheet for Healthcare Providers:  IncredibleEmployment.be  This test is no t yet approved or cleared by the [place] FDA and  has been authorized for detection and/or diagnosis of SARS-CoV-2 by FDA under an Emergency Use  Authorization (EUA). This EUA will remain  in effect (meaning this test can be used) for the duration of the COVID-19 declaration under Section 564(b)(1) of the Act, 21 U.S.C.section 360bbb-3(b)(1), unless the authorization is terminated  or revoked sooner.       Influenza A by PCR NEGATIVE NEGATIVE Final   Influenza B by PCR NEGATIVE NEGATIVE Final    Comment: (NOTE) The Xpert Xpress SARS-CoV-2/FLU/RSV plus assay is intended as an aid in the diagnosis of influenza from Nasopharyngeal swab specimens and should not be used as a sole basis for treatment. Nasal washings and aspirates are unacceptable for Xpert Xpress SARS-CoV-2/FLU/RSV testing.  Fact Sheet for Patients: EntrepreneurPulse.com.au  Fact Sheet for Healthcare Providers: IncredibleEmployment.be  This test is not yet approved or cleared by the [place] FDA and has been authorized for detection and/or diagnosis of SARS-CoV-2 by FDA under an Emergency Use Authorization (EUA). This EUA will remain in effect (meaning this test can be used) for the duration of the COVID-19 declaration under Section 564(b)(1) of the Act, 21 U.S.C. section 360bbb-3(b)(1), unless the authorization is terminated or revoked.  Performed at [facility], [place] [address], [place], [place] [address]   Aerobic/Anaerobic Culture w Gram Stain (surgical/deep wound)     Status: None (Preliminary result)   Collection Time: 01/20/21  5:10 PM   Specimen: Wound; Abscess  Result Value Ref Range Status   Specimen Description ABSCESS  Final   Special Requests RIGHT THIGH ABSCESS SPEC A  Final   Gram Stain   Final    FEW WBC PRESENT,BOTH PMN AND MONONUCLEAR FEW GRAM NEGATIVE RODS Performed at [facility], [place] [address], [place], [place] [address]    Culture   Final    FEW SERRATIA MARCESCENS NO ANAEROBES ISOLATED; CULTURE IN PROGRESS FOR 5 DAYS    Report Status PENDING  Incomplete   Organism ID,  Bacteria SERRATIA MARCESCENS  Final      Susceptibility   Serratia marcescens - MIC*    CEFAZOLIN >=64 RESISTANT Resistant     CEFEPIME <=0.12 SENSITIVE Sensitive     CEFTAZIDIME <=1 SENSITIVE Sensitive     CEFTRIAXONE <=0.25 SENSITIVE Sensitive     CIPROFLOXACIN <=0.25 SENSITIVE Sensitive     GENTAMICIN <=1 SENSITIVE Sensitive  TRIMETH/SULFA <=20 SENSITIVE Sensitive     * FEW SERRATIA MARCESCENS  Surgical pcr screen     Status: Abnormal   Collection Time: 01/20/21  6:31 PM   Specimen: Nasal Mucosa; Nasal Swab  Result Value Ref Range Status   MRSA, PCR POSITIVE (A) NEGATIVE Final    Comment: RESULT CALLED TO, READ BACK BY AND VERIFIED WITH: SAVAGE RN '@2024'$  01/20/21 EB    Staphylococcus aureus POSITIVE (A) NEGATIVE Final    Comment: (NOTE) The Xpert SA Assay (FDA approved for NASAL specimens in patients 17 years of age and older), is one component of a comprehensive surveillance program. It is not intended to diagnose infection nor to guide or monitor treatment. Performed at [facility], [place] [address], [place], [place] [address]       Imaging Studies   No results found.   Medications   Scheduled Meds:  amiodarone  200 mg Oral BID   atorvastatin  40 mg Oral QPM   calcium acetate  1,334 mg Oral TID WC   Chlorhexidine Gluconate Cloth  6 each Topical Q0600   Chlorhexidine Gluconate Cloth  6 each Topical Q0600   [START ON 01/28/2021] darbepoetin (ARANESP) injection - DIALYSIS  100 mcg Intravenous Q Fri-HD   Gerhardt's butt cream   Topical BID   heparin sodium (porcine)       insulin aspart  0-6 Units Subcutaneous Q4H   levothyroxine  50 mcg Oral Q0600   midodrine  10 mg Oral TID WC   polyethylene glycol  17 g Oral Daily   Continuous Infusions:   ceFAZolin (ANCEF) IV         LOS: 6 days    Time spent: 30 minutes with >50% spent at bedside and in coordination of care.    Ezekiel Slocumb, DO [facility]  01/24/2021, 4:12 PM      If  7PM-7AM, please contact night-coverage. How to contact the [facility] Attending or Consulting provider [place] or covering provider during after hours [place], for this patient?    Check the care team in [facility] and look for a) attending/consulting [facility] provider listed and b) the [facility] team listed Log into [email or web address] and use [place]'s universal password to access. If you do not have the password, please contact the hospital operator. Locate the [facility] provider you are looking for under [facility] and page to a number that you can be directly reached. If you still have difficulty reaching the provider, please page the [facility] (Director on Call) for the Hospitalists listed on amion for assistance.

## 2021-01-24 NOTE — Progress Notes (Signed)
Patient ID: Kyle Walton, male   DOB: 1956-07-03, 65 y.o.   MRN: [id number] S: Feels a little better today.  More awake and alert O:BP (!) 87/65   Pulse 100   Temp 97.8 F (36.6 C) (Oral)   Resp 16   Ht '5\' 11"'$  (1.803 m)   Wt 115 kg   SpO2 96%   BMI 35.36 kg/m   Intake/Output Summary (Last 24 hours) at 01/24/2021 1020 Last data filed at 01/24/2021 0400 Gross per 24 hour  Intake 130 ml  Output 45 ml  Net 85 ml   Intake/Output: I/O last 3 completed shifts: In: 264 [I.V.:60; Other:30; IV Piggyback:174] Out: 45 [Drains:45]  Intake/Output this shift:  No intake/output data recorded. Weight change: 0.8 kg Gen:NAD CVS: RRR Resp: CTA Abd: +BS, soft, NT/ND Ext: right leg with wound vac and edema/tenderness  Recent Labs  Lab 01/17/21 2048 01/17/21 2201 01/18/21 0342 01/18/21 0344 01/19/21 0037 01/20/21 0706 01/20/21 1840 01/21/21 0613 01/22/21 0028 01/23/21 0243 01/24/21 0624  NA 138   < > 138   < > 137 137 136 136 138 140 139  K 4.3   < > 4.4   < > 3.5 3.7 3.6 3.9 3.8 3.8 3.6  CL 100  --  102  --  101 96*  --  96* 101 100 101  CO2 20*  --  17*  --  22 21*  --  17* '25 28 25  '$ GLUCOSE 202*  --  146*  --  140* 189*  --  149* 122* 165* 146*  BUN 54*  --  56*  --  32* 21  --  30* 16 24* 28*  CREATININE 7.32*  --  7.26*  --  5.10* 3.72*  --  4.67* 3.06* 4.45* 5.51*  ALBUMIN 2.2*  --  2.1*  --  2.5* 2.3*  --  2.2* 2.3* 2.3*  --   CALCIUM 8.8*  --  8.8*  --  8.3* 8.6*  --  8.7* 8.8* 9.0 8.6*  PHOS  --   --  5.3*  --  3.8  --   --   --   --   --   --   AST 30  --  27  --  28 28  --  '29 28 29  '$ --   ALT 8  --  8  --  8 9  --  '6 9 9  '$ --    < > = values in this interval not displayed.   Liver Function Tests: Recent Labs  Lab 01/21/21 0613 01/22/21 0028 01/23/21 0243  AST '29 28 29  '$ ALT '6 9 9  '$ ALKPHOS 104 84 95  BILITOT 2.0* 1.6* 1.3*  PROT 5.7* 5.7* 6.5  ALBUMIN 2.2* 2.3* 2.3*   No results for input(s): LIPASE, AMYLASE in the last 168 hours. Recent Labs  Lab  01/18/21 0200  AMMONIA 61*   CBC: Recent Labs  Lab 01/20/21 0706 01/20/21 1840 01/21/21 0613 01/22/21 0028 01/23/21 0243 01/24/21 0624  WBC 19.8*  --  22.3* 16.6* 14.1* 13.5*  NEUTROABS 17.0*  --  19.4* 14.2* 11.8* 11.0*  HGB 7.9*   < > 8.0* 7.3* 7.2* 7.3*  HCT 25.1*   < > 25.4* 23.2* 23.7* 23.9*  MCV 88.4  --  88.8 88.2 91.5 91.2  PLT 235  --  239 193 215 210   < > = values in this interval not displayed.   Cardiac Enzymes: No results for input(s): CKTOTAL, CKMB, CKMBINDEX,  TROPONINI in the last 168 hours. CBG: Recent Labs  Lab 01/23/21 1515 01/23/21 1926 01/23/21 2342 01/24/21 0327 01/24/21 0734  GLUCAP 152* 193* 175* 155* 137*    Iron Studies: No results for input(s): IRON, TIBC, TRANSFERRIN, FERRITIN in the last 72 hours. Studies/Results: No results found.  amiodarone  200 mg Oral BID   atorvastatin  40 mg Oral QPM   calcium acetate  1,334 mg Oral TID WC   Chlorhexidine Gluconate Cloth  6 each Topical Q0600   Chlorhexidine Gluconate Cloth  6 each Topical Q0600   [START ON 01/28/2021] darbepoetin (ARANESP) injection - DIALYSIS  100 mcg Intravenous Q Fri-HD   Gerhardt's butt cream   Topical BID   insulin aspart  0-6 Units Subcutaneous Q4H   levothyroxine  50 mcg Oral Q0600   midodrine  10 mg Oral TID WC   polyethylene glycol  17 g Oral Daily    BMET    Component Value Date/Time   NA 139 01/24/2021 0624   K 3.6 01/24/2021 0624   CL 101 01/24/2021 0624   CO2 25 01/24/2021 0624   GLUCOSE 146 (H) 01/24/2021 0624   BUN 28 (H) 01/24/2021 0624   CREATININE 5.51 (H) 01/24/2021 0624   CALCIUM 8.6 (L) 01/24/2021 0624   GFRNONAA 11 (L) 01/24/2021 0624   GFRAA 6 (L) 01/17/2020 1605   CBC    Component Value Date/Time   WBC 13.5 (H) 01/24/2021 0624   RBC 2.62 (L) 01/24/2021 0624   HGB 7.3 (L) 01/24/2021 0624   HCT 23.9 (L) 01/24/2021 0624   PLT 210 01/24/2021 0624   MCV 91.2 01/24/2021 0624   MCH 27.9 01/24/2021 0624   MCHC 30.5 01/24/2021 0624   RDW 18.7  (H) 01/24/2021 0624   LYMPHSABS 1.0 01/24/2021 0624   MONOABS 1.2 (H) 01/24/2021 0624   EOSABS 0.2 01/24/2021 0624   BASOSABS 0.1 01/24/2021 0624    Dialysis Orders: MWF at [facility] 4hr, 450/800, EDW 117kg, 3K/2.5Ca, TDC, heparin 4900 bolus + 500/hr  Assessment/Plan:  Right thigh abscess/pyomyositis - s/p I&D on 7/14 by orthopedics.  No evidence of calciphylaxis.   AMS - improved ESRD - continue with HD on MWF schedule.  OK to go to HD unit Chronic hypotension - on midodrine Volume overload - continue with UF as tolerated. Anemia of CKD stage V - started on ESA and transfuse prn if < 7. CKD-BMD - continue with binders, no vit D PAF - per cardiology to have cardioversion later this month Diastolic CHF - UF as tolerated as above.   Donetta Potts, MD [employer] [phone]

## 2021-01-24 NOTE — Plan of Care (Signed)

## 2021-01-24 NOTE — Progress Notes (Signed)
Patient placed on bipap at this time. 16/8 with no oxygen bled in. SPO2 98% on bipap at this time.  RT will continue to monitor.

## 2021-01-24 NOTE — Progress Notes (Signed)
  Speech Language Pathology Treatment: Dysphagia  Patient Details Name: Kyle Walton MRN: [id number] DOB: [birth year] Today's Date: 01/24/2021 Time: [id number] SLP Time Calculation (min) (ACUTE ONLY): 12 min  Assessment / Plan / Recommendation Clinical Impression  Pt cognitively appropriate during session. He denied difficulty swallowing since Saturday and states "I cough all day especially when lying down and start to sit up". There was 1 delayed cough after cracker and sip thin. RN has not noted dysphagia during her interactions/observations. Respirations stable. Eructation and hiccups present after po's which he states happens a lot. He denied reflux however given clinical observations, body habitus he may be at increased risk. Recommend continue regular/thin and no further ST needed. Educated pt swallow precautions and esophageal precautions.    HPI HPI: Pt is a 65 y.o male admitted to ED on 01/17/2021 with progressive somnolence and confusion with right leg pain and swelling. Pt with the working diagnosis of right lower extremity cellulitis, complicated with severe sepsis (metabolic encephalopathy).  Recent hospitalization 5/27-6/24 for RLE cellulitis. ETT 7/14- 7/15. 06/30/20: ([place]) SLP evaluation, FEES; Mild oropharyngeal; dysphagia with no aspiration observed; Recommendation: Regular diet consistency; Thin Liquids. PMH: end-stage renal disease on hemodialysis (MWF), anemia of CKD, type 2 DM, chronic diastolic heart failure, paroxysmal afib chronically anticoagulated on Eliquis, chronic hypoxic hypercapnic respiratory failure on 2 L continuous nasal cannula as well as nocturnal BiPAP, acquired hypothyroidism.      SLP Plan  All goals met;Discharge SLP treatment due to (comment)       Recommendations  Diet recommendations: Regular;Thin liquid Liquids provided via: Cup;Straw Medication Administration: Whole meds with liquid Supervision: Patient able to self feed Compensations:  Slow rate;Small sips/bites Postural Changes and/or Swallow Maneuvers: Seated upright 90 degrees;Upright 30-60 min after meal                Oral Care Recommendations: Oral care BID Follow up Recommendations: None SLP Visit Diagnosis: Dysphagia, unspecified (R13.10) Plan: All goals met;Discharge SLP treatment due to (comment)                     Houston Siren 01/24/2021, 10:46 AM  Kyle Walton.Ed Risk analyst [phone] Office [phone]

## 2021-01-24 NOTE — Progress Notes (Signed)
Patient's belongings delivered to patient in Hemodialysis by this RN.

## 2021-01-25 DIAGNOSIS — G9340 Encephalopathy, unspecified: Secondary | ICD-10-CM | POA: Diagnosis not present

## 2021-01-25 LAB — CBC WITH DIFFERENTIAL/PLATELET
Abs Immature Granulocytes: 0.08 10*3/uL — ABNORMAL HIGH (ref 0.00–0.07)
Basophils Absolute: 0 10*3/uL (ref 0.0–0.1)
Basophils Relative: 0 %
Eosinophils Absolute: 0.1 10*3/uL (ref 0.0–0.5)
Eosinophils Relative: 2 %
HCT: 27.4 % — ABNORMAL LOW (ref 39.0–52.0)
Hemoglobin: 8.3 g/dL — ABNORMAL LOW (ref 13.0–17.0)
Immature Granulocytes: 1 %
Lymphocytes Relative: 6 %
Lymphs Abs: 0.6 10*3/uL — ABNORMAL LOW (ref 0.7–4.0)
MCH: 27.9 pg (ref 26.0–34.0)
MCHC: 30.3 g/dL (ref 30.0–36.0)
MCV: 92.3 fL (ref 80.0–100.0)
Monocytes Absolute: 0.3 10*3/uL (ref 0.1–1.0)
Monocytes Relative: 3 %
Neutro Abs: 8.3 10*3/uL — ABNORMAL HIGH (ref 1.7–7.7)
Neutrophils Relative %: 88 %
Platelets: 235 10*3/uL (ref 150–400)
RBC: 2.97 MIL/uL — ABNORMAL LOW (ref 4.22–5.81)
RDW: 18.7 % — ABNORMAL HIGH (ref 11.5–15.5)
WBC: 9.4 10*3/uL (ref 4.0–10.5)
nRBC: 0.2 % (ref 0.0–0.2)

## 2021-01-25 LAB — GLUCOSE, CAPILLARY
Glucose-Capillary: 147 mg/dL — ABNORMAL HIGH (ref 70–99)
Glucose-Capillary: 148 mg/dL — ABNORMAL HIGH (ref 70–99)
Glucose-Capillary: 150 mg/dL — ABNORMAL HIGH (ref 70–99)
Glucose-Capillary: 167 mg/dL — ABNORMAL HIGH (ref 70–99)
Glucose-Capillary: 167 mg/dL — ABNORMAL HIGH (ref 70–99)
Glucose-Capillary: 198 mg/dL — ABNORMAL HIGH (ref 70–99)
Glucose-Capillary: 209 mg/dL — ABNORMAL HIGH (ref 70–99)
Glucose-Capillary: 217 mg/dL — ABNORMAL HIGH (ref 70–99)

## 2021-01-25 LAB — BASIC METABOLIC PANEL
Anion gap: 11 (ref 5–15)
BUN: 16 mg/dL (ref 8–23)
CO2: 26 mmol/L (ref 22–32)
Calcium: 8.4 mg/dL — ABNORMAL LOW (ref 8.9–10.3)
Chloride: 99 mmol/L (ref 98–111)
Creatinine, Ser: 3.83 mg/dL — ABNORMAL HIGH (ref 0.61–1.24)
GFR, Estimated: 17 mL/min — ABNORMAL LOW (ref >60)
Glucose, Bld: 167 mg/dL — ABNORMAL HIGH (ref 70–99)
Potassium: 3.6 mmol/L (ref 3.5–5.1)
Sodium: 136 mmol/L (ref 135–145)

## 2021-01-25 LAB — AEROBIC/ANAEROBIC CULTURE W GRAM STAIN (SURGICAL/DEEP WOUND)

## 2021-01-25 MED ORDER — DIPHENHYDRAMINE HCL 25 MG PO CAPS
50.0000 mg | ORAL_CAPSULE | Freq: Three times a day (TID) | ORAL | Status: DC | PRN
  Administered 2021-01-25 – 2021-01-28 (×5): 50 mg via ORAL
  Filled 2021-01-25 (×5): qty 2

## 2021-01-25 MED ORDER — SODIUM CHLORIDE 0.9 % IV SOLN
1.0000 g | INTRAVENOUS | Status: DC
  Administered 2021-01-25 – 2021-01-28 (×4): 1 g via INTRAVENOUS
  Filled 2021-01-25 (×4): qty 1

## 2021-01-25 MED ORDER — MUPIROCIN 2 % EX OINT
TOPICAL_OINTMENT | Freq: Two times a day (BID) | CUTANEOUS | Status: DC
  Administered 2021-01-25: 1 via NASAL
  Filled 2021-01-25: qty 22

## 2021-01-25 NOTE — Progress Notes (Signed)
PROGRESS NOTE    K[facility]   [id number]  DOB: 1955-11-21  PCP: Patient, No Pcp Per (Inactive)    DOA: 01/17/2021 LOS: 7   Assessment & Plan   Principal Problem:   Acute encephalopathy Active Problems:   Diabetes mellitus with peripheral vascular disease ([place])   History of DVT (deep vein thrombosis)   OSA (obstructive sleep apnea)   Paroxysmal atrial fibrillation (HCC)   ESRD on hemodialysis (HCC)   Impaired physical mobility   Acute on chronic respiratory failure with hypoxia and hypercapnia (HCC)   Cellulitis of right leg   Acute on chronic diastolic (congestive) heart failure (HCC)   High anion gap metabolic acidosis   Hypotension   Acquired hypothyroidism   Dry gangrene (HCC)   Sepsis, unspecified organism ([place])   Anemia of renal disease   Acquired hypothyroidism On Synthroid  Diabetes mellitus with peripheral vascular disease (HCC) Sliding scale Novolog Adjust insulin for inpatient goal 140-180 Resume home Lyrica for peripheral neuropathy  ESRD on hemodialysis ([place]) On HD MWF.  Nephrology following. --Follow BMP  Paroxysmal atrial fibrillation (HCC) --Eliquis on hold for now, restart when certain no more surgical intervention --Resumed on amiodarone 7/16 --Digoxin held    Hypotension Chronic.  Stable. Continue midodrine 10 mg TID Maintain MAP > 65 Hold metoprolol  Anemia of renal disease No evidence bleeding. Monitor CBC. Transfuse if Hbg<7 or hemodynamically significant bleeding.  Cellulitis of right leg MRSA PCR positive. Debridement on 7/14.   Wound vac in place, JP drain. Follow wound culture growing SERRATIA MARCESCENS sensitive to Ancef Mgmt per orthopedic surgery May need further debridement in OR next week  Anticoagulation and plavix on hold  Initially covered with empiric broad spectrum - Vancomycin and Zosyn  Transition to Ancef (7/18)   Acute encephalopathy Due to Sepsis, severe illness.  Resolving. --Delirium  precautions:     -Lights and TV off, minimize interruptions at night    -Blinds open and lights on during day    -Glasses/hearing aid with patient    -Frequent reorientation    -PT/OT when able    -Avoid sedation medications as much as possible   Acute on chronic diastolic (congestive) heart failure (HCC) Appears close to euvolemia.   Monitor volume status closely.  Acute on chronic respiratory failure with hypoxia and hypercapnia (HCC) Required continued MV post-operatively.   Extubated 7/15 --Pulmonary hygiene --BiPAP nightly  OSA (obstructive sleep apnea) BiPAP nightly  History of DVT (deep vein thrombosis) Anticoagulation on hold in case of further procedures. SCDs for VTE ppx.  Impaired physical mobility PT recommending SNF. TOC following for placement.    Distributive shock due to sedation vs cytokine response to surgery.  Resolved   Hyperbilirubinemia due to sepsis, improved Hyperammonemia -Follow intermittently  Obesity: Body mass index is 34.56 kg/m.  Complicates overall care and prognosis.  Recommend lifestyle modifications including physical activity and diet for weight loss and overall long-term health.   DVT prophylaxis:    Diet:  Diet Orders (From admission, onward)     Start     Ordered   01/22/21 0933  Diet regular Room service appropriate? Yes with Assist; Fluid consistency: Thin  Diet effective now       Question Answer Comment  Room service appropriate? Yes with Assist   Fluid consistency: Thin      01/22/21 0933              Code Status: Full Code   Brief Narrative / Hospital Course to Date:   "  65 year old male presents to ED on 7/11 with right leg pain and swelling. Recent hospitalization 5/27-6/24 for RLE cellulitis. On arrival to ED patient with hypoxia requiring 2L [place] and lethargic. ABG 7.195/52.4/60. Placed on BiPAP. CXR with central vascular congestion. WBC 20.7. Lactic Acid 1.2. Troponin 420. Crt 7.32. Given  Cefepime/Vancomycin. BP on arrival 114/49 however during stay dropped to 89/45. Critical Care consulted."  Subjective 01/25/21    Pt awake sitting up in bed this AM, pleasant with mild confusion.  Seen with ortho PA.  He reports significant pain in the right thigh.  No other acute complaints.   Significant events: 7/12 admitted with encephalopathy and sepsis, started on vanc, ceftriaxone. HD on 7/12. 7/14> to OR for debridement of thigh hematoma> to ICU after intubated. Switched to vanc + zosyn 7/15 extubated 7/17 transferred to [facility] service    Disposition Plan & Communication   Status is: Inpatient  Remains inpatient appropriate because:Inpatient level of care appropriate due to severity of illness  Dispo: The patient is from: Home              Anticipated d/c is to: Home              Patient currently is not medically stable to d/c.   Difficult to place patient No   Consults, Procedures, Significant Events   Consultants:  Orthopedic surgery  Procedures:  Surgical debridement, right thigh abscess' Wound vac placement Intubation / extubation Dialysis  Antimicrobials:  Anti-infectives (From admission, onward)    Start     Dose/Rate Route Frequency Ordered Stop   01/25/21 2200  ceFEPIme (MAXIPIME) 1 g in sodium chloride 0.9 % 100 mL IVPB        1 g 200 mL/hr over 30 Minutes Intravenous Every 24 hours 01/25/21 0852     01/24/21 2315  cefTRIAXone (ROCEPHIN) 2 g in sodium chloride 0.9 % 100 mL IVPB  Status:  Discontinued        2 g 200 mL/hr over 30 Minutes Intravenous Every 24 hours 01/24/21 2224 01/25/21 0852   01/24/21 1615  cefTRIAXone (ROCEPHIN) 2 g in sodium chloride 0.9 % 100 mL IVPB  Status:  Discontinued        2 g 200 mL/hr over 30 Minutes Intravenous Every 24 hours 01/24/21 1610 01/24/21 1612   01/24/21 1615  ceFAZolin (ANCEF) IVPB 2g/100 mL premix  Status:  Discontinued        2 g 200 mL/hr over 30 Minutes Intravenous Every 8 hours 01/24/21 1612 01/24/21 2224    01/24/21 1200  vancomycin (VANCOCIN) IVPB 1000 mg/200 mL premix  Status:  Discontinued        1,000 mg 200 mL/hr over 60 Minutes Intravenous Every M-W-F (Hemodialysis) 01/21/21 1134 01/24/21 1610   01/21/21 1430  Vancomycin (VANCOCIN) 1,250 mg in sodium chloride 0.9 % 250 mL IVPB        1,250 mg 166.7 mL/hr over 90 Minutes Intravenous Every Fri (Hemodialysis) 01/21/21 1342 01/21/21 1713   01/21/21 1230  vancomycin (VANCOCIN) 1,250 mg in sodium chloride 0.9 % 250 mL IVPB  Status:  Discontinued        1,250 mg 166.7 mL/hr over 90 Minutes Intravenous Every Fri (Hemodialysis) 01/21/21 1134 01/21/21 1335   01/20/21 1800  piperacillin-tazobactam (ZOSYN) IVPB 3.375 g  Status:  Discontinued        3.375 g 100 mL/hr over 30 Minutes Intravenous Every 6 hours 01/20/21 1504 01/20/21 1544   01/20/21 1615  ceFAZolin (ANCEF) IVPB 2g/100  mL premix  Status:  Discontinued        2 g 200 mL/hr over 30 Minutes Intravenous On call to O.R. 01/20/21 1532 01/20/21 1742   01/20/21 1600  piperacillin-tazobactam (ZOSYN) IVPB 2.25 g  Status:  Discontinued        2.25 g 100 mL/hr over 30 Minutes Intravenous Every 8 hours 01/20/21 1544 01/24/21 1610   01/20/21 1542  ceFAZolin (ANCEF) 2-4 GM/100ML-% IVPB       Note to Pharmacy: Bobbie Stack   : cabinet override      01/20/21 1542 01/21/21 0359   01/19/21 1816  vancomycin (VANCOCIN) 1-5 GM/200ML-% IVPB       Note to Pharmacy: Louretta Shorten   : cabinet override      01/19/21 1816 01/20/21 0629   01/19/21 1200  vancomycin (VANCOCIN) IVPB 1000 mg/200 mL premix  Status:  Discontinued        1,000 mg 200 mL/hr over 60 Minutes Intravenous Every M-W-F (Hemodialysis) 01/19/21 0936 01/21/21 1134   01/18/21 0115  cefTRIAXone (ROCEPHIN) 1 g in sodium chloride 0.9 % 100 mL IVPB  Status:  Discontinued        1 g 200 mL/hr over 30 Minutes Intravenous Every 24 hours 01/18/21 0101 01/20/21 1504   01/17/21 2315  vancomycin (VANCOREADY) IVPB 2000 mg/400 mL        2,000 mg 200  mL/hr over 120 Minutes Intravenous  Once 01/17/21 2306 01/18/21 0147   01/17/21 2307  vancomycin variable dose per unstable renal function (pharmacist dosing)  Status:  Discontinued         Does not apply See admin instructions 01/17/21 2307 01/19/21 1013         Micro    Objective   Vitals:   01/25/21 0500 01/25/21 0502 01/25/21 0727 01/25/21 1205  BP:  (!) 123/101 105/61 109/65  Pulse:  (!) 108 (!) 113 (!) 105  Resp:  '18 19 15  '$ Temp:  97.7 F (36.5 C) 98.1 F (36.7 C) 98.6 F (37 C)  TempSrc:  Oral Oral Oral  SpO2:  98% 94% 90%  Weight: 112.4 kg     Height:        Intake/Output Summary (Last 24 hours) at 01/25/2021 1221 Last data filed at 01/25/2021 0300 Gross per 24 hour  Intake 278.21 ml  Output 3000 ml  Net -2721.79 ml   Filed Weights   01/24/21 0500 01/24/21 1230 01/25/21 0500  Weight: 115 kg 112.9 kg 112.4 kg    Physical Exam:  General exam: in dialysis, awake, alert, no acute distress, obese Respiratory system: normal respiratory effort, on 2 L/min oxygen. Cardiovascular system: normal S1/S2, RRR, 1-2+ LE edema.   Central nervous system: A&O. no gross focal neurologic deficits, normal speech Extremities: R lateral thigh with wound vac in place appears well seale, 1-2+ lower extremity edema, normal tone Psychiatry: normal mood, congruent affect  Labs   Data Reviewed: I have personally reviewed following labs and imaging studies  CBC: Recent Labs  Lab 01/21/21 0613 01/22/21 0028 01/23/21 0243 01/24/21 0624 01/25/21 0050  WBC 22.3* 16.6* 14.1* 13.5* 9.4  NEUTROABS 19.4* 14.2* 11.8* 11.0* 8.3*  HGB 8.0* 7.3* 7.2* 7.3* 8.3*  HCT 25.4* 23.2* 23.7* 23.9* 27.4*  MCV 88.8 88.2 91.5 91.2 92.3  PLT 239 193 215 210 [id number]   Basic Metabolic Panel: Recent Labs  Lab 01/19/21 0037 01/20/21 0706 01/22/21 0028 01/23/21 0243 01/24/21 0624 01/24/21 2054 01/25/21 0050  NA 137   < > 138 140  139 136 136  K 3.5   < > 3.8 3.8 3.6 3.5 3.6  CL 101   < > 101 100  101 97* 99  CO2 22   < > '25 28 25 27 26  '$ GLUCOSE 140*   < > 122* 165* 146* 185* 167*  BUN 32*   < > 16 24* 28* 16 16  CREATININE 5.10*   < > 3.06* 4.45* 5.51* 3.70* 3.83*  CALCIUM 8.3*   < > 8.8* 9.0 8.6* 8.5* 8.4*  MG 1.9  --   --   --   --  1.8  --   PHOS 3.8  --   --   --   --   --   --    < > = values in this interval not displayed.   GFR: Estimated Creatinine Clearance: 24.5 mL/min (A) (by C-G formula based on SCr of 3.83 mg/dL (H)). Liver Function Tests: Recent Labs  Lab 01/19/21 0037 01/20/21 0706 01/21/21 0613 01/22/21 0028 01/23/21 0243  AST '28 28 29 28 29  '$ ALT '8 9 6 9 9  '$ ALKPHOS 87 107 104 84 95  BILITOT 1.4* 1.5* 2.0* 1.6* 1.3*  PROT 6.1* 6.4* 5.7* 5.7* 6.5  ALBUMIN 2.5* 2.3* 2.2* 2.3* 2.3*   No results for input(s): LIPASE, AMYLASE in the last 168 hours. No results for input(s): AMMONIA in the last 168 hours.  Coagulation Profile: No results for input(s): INR, PROTIME in the last 168 hours.  Cardiac Enzymes: No results for input(s): CKTOTAL, CKMB, CKMBINDEX, TROPONINI in the last 168 hours. BNP (last 3 results) No results for input(s): PROBNP in the last 8760 hours. HbA1C: No results for input(s): HGBA1C in the last 72 hours. CBG: Recent Labs  Lab 01/24/21 2049 01/25/21 0006 01/25/21 0337 01/25/21 0729 01/25/21 1208  GLUCAP 167* 150* 147* 167* 148*   Lipid Profile: Recent Labs    01/24/21 0624  TRIG 118   Thyroid Function Tests: No results for input(s): TSH, T4TOTAL, FREET4, T3FREE, THYROIDAB in the last 72 hours. Anemia Panel: No results for input(s): VITAMINB12, FOLATE, FERRITIN, TIBC, IRON, RETICCTPCT in the last 72 hours. Sepsis Labs: Recent Labs  Lab 01/18/21 2105 01/19/21 0037 01/19/21 0038 01/19/21 0412  PROCALCITON  --  1.60  --   --   LATICACIDVEN 1.4  --  1.2 1.1    Recent Results (from the past 240 hour(s))  Blood culture (routine x 2)     Status: None   Collection Time: 01/17/21  9:38 PM   Specimen: BLOOD  Result Value  Ref Range Status   Specimen Description BLOOD RIGHT ARM  Final   Special Requests   Final    BOTTLES DRAWN AEROBIC AND ANAEROBIC Blood Culture adequate volume   Culture   Final    NO GROWTH 5 DAYS Performed at [facility], [address], [place], [place] [address]    Report Status 01/22/2021 FINAL  Final  Blood culture (routine x 2)     Status: None   Collection Time: 01/17/21  9:40 PM   Specimen: BLOOD  Result Value Ref Range Status   Specimen Description BLOOD SITE NOT SPECIFIED  Final   Special Requests   Final    BOTTLES DRAWN AEROBIC AND ANAEROBIC Blood Culture adequate volume   Culture   Final    NO GROWTH 5 DAYS Performed at [facility], [address], [place], [place] [address]    Report Status 01/22/2021 FINAL  Final  Resp Panel by RT-PCR (Flu A&B, Covid) Nasopharyngeal Swab     Status: None   Collection Time: 01/17/21  9:50 PM   Specimen: Nasopharyngeal Swab; Nasopharyngeal(NP) swabs in vial transport medium  Result Value Ref Range Status   SARS Coronavirus 2 by RT PCR NEGATIVE NEGATIVE Final    Comment: (NOTE) SARS-CoV-2 target nucleic acids are NOT DETECTED.  The SARS-CoV-2 RNA is generally detectable in upper respiratory specimens during the acute phase of infection. The lowest concentration of SARS-CoV-2 viral copies this assay can detect is 138 copies/mL. A negative result does not preclude SARS-Cov-2 infection and should not be used as the sole basis for treatment or other patient management decisions. A negative result may occur with  improper specimen collection/handling, submission of specimen other than nasopharyngeal swab, presence of viral mutation(s) within the areas targeted by this assay, and inadequate number of viral copies(<138 copies/mL). A negative result must be combined with clinical observations, patient history, and epidemiological information. The expected result is Negative.  Fact Sheet for Patients:   EntrepreneurPulse.com.au  Fact Sheet for Healthcare Providers:  IncredibleEmployment.be  This test is no t yet approved or cleared by the [place] FDA and  has been authorized for detection and/or diagnosis of SARS-CoV-2 by FDA under an Emergency Use Authorization (EUA). This EUA will remain  in effect (meaning this test can be used) for the duration of the COVID-19 declaration under Section 564(b)(1) of the Act, 21 U.S.C.section 360bbb-3(b)(1), unless the authorization is terminated  or revoked sooner.       Influenza A by PCR NEGATIVE NEGATIVE Final   Influenza B by PCR NEGATIVE NEGATIVE Final    Comment: (NOTE) The Xpert Xpress SARS-CoV-2/FLU/RSV plus assay is intended as an aid in the diagnosis of influenza from Nasopharyngeal swab specimens and should not be used as a sole basis for treatment. Nasal washings and aspirates are unacceptable for Xpert Xpress SARS-CoV-2/FLU/RSV testing.  Fact Sheet for Patients: EntrepreneurPulse.com.au  Fact Sheet for Healthcare Providers: IncredibleEmployment.be  This test is not yet approved or cleared by the [place] FDA and has been authorized for detection and/or diagnosis of SARS-CoV-2 by FDA under an Emergency Use Authorization (EUA). This EUA will remain in effect (meaning this test can be used) for the duration of the COVID-19 declaration under Section 564(b)(1) of the Act, 21 U.S.C. section 360bbb-3(b)(1), unless the authorization is terminated or revoked.  Performed at [facility], [place] [address], [place], [place] [address]   Aerobic/Anaerobic Culture w Gram Stain (surgical/deep wound)     Status: None   Collection Time: 01/20/21  5:10 PM   Specimen: Wound; Abscess  Result Value Ref Range Status   Specimen Description ABSCESS  Final   Special Requests RIGHT THIGH ABSCESS SPEC A  Final   Gram Stain   Final    FEW WBC PRESENT,BOTH PMN  AND MONONUCLEAR FEW GRAM NEGATIVE RODS    Culture   Final    FEW SERRATIA MARCESCENS NO ANAEROBES ISOLATED Performed at [facility], [address], [place], [place] [address]    Report Status 01/25/2021 FINAL  Final   Organism ID, Bacteria SERRATIA MARCESCENS  Final      Susceptibility   Serratia marcescens - MIC*    CEFAZOLIN >=64 RESISTANT Resistant     CEFEPIME <=0.12 SENSITIVE Sensitive     CEFTAZIDIME <=1 SENSITIVE Sensitive     CEFTRIAXONE <=0.25 SENSITIVE Sensitive     CIPROFLOXACIN <=0.25 SENSITIVE Sensitive     GENTAMICIN <=  1 SENSITIVE Sensitive     TRIMETH/SULFA <=20 SENSITIVE Sensitive     * FEW SERRATIA MARCESCENS  Surgical pcr screen     Status: Abnormal   Collection Time: 01/20/21  6:31 PM   Specimen: Nasal Mucosa; Nasal Swab  Result Value Ref Range Status   MRSA, PCR POSITIVE (A) NEGATIVE Final    Comment: RESULT CALLED TO, READ BACK BY AND VERIFIED WITH: SAVAGE RN '@2024'$  01/20/21 EB    Staphylococcus aureus POSITIVE (A) NEGATIVE Final    Comment: (NOTE) The Xpert SA Assay (FDA approved for NASAL specimens in patients 30 years of age and older), is one component of a comprehensive surveillance program. It is not intended to diagnose infection nor to guide or monitor treatment. Performed at [facility], [place] [address], [place], [place] [address]       Imaging Studies   No results found.   Medications   Scheduled Meds:  amiodarone  200 mg Oral BID   atorvastatin  40 mg Oral QPM   calcium acetate  1,334 mg Oral TID WC   Chlorhexidine Gluconate Cloth  6 each Topical Q0600   Chlorhexidine Gluconate Cloth  6 each Topical Q0600   [START ON 01/28/2021] darbepoetin (ARANESP) injection - DIALYSIS  100 mcg Intravenous Q Fri-HD   Gerhardt's butt cream   Topical BID   insulin aspart  0-6 Units Subcutaneous Q4H   levothyroxine  50 mcg Oral Q0600   midodrine  10 mg Oral TID WC   mupirocin ointment   Nasal BID   polyethylene glycol  17 g Oral  Daily   pregabalin  25 mg Oral BID   Continuous Infusions:  ceFEPime (MAXIPIME) IV         LOS: 7 days    Time spent: 25 minutes with >50% spent at bedside and in coordination of care.    Ezekiel Slocumb, DO [facility]  01/25/2021, 12:21 PM      If 7PM-7AM, please contact night-coverage. How to contact the [facility] Attending or Consulting provider [place] or covering provider during after hours [place], for this patient?    Check the care team in [facility] and look for a) attending/consulting [facility] provider listed and b) the [facility] team listed Log into [email or web address] and use [place]'s universal password to access. If you do not have the password, please contact the hospital operator. Locate the [facility] provider you are looking for under [facility] and page to a number that you can be directly reached. If you still have difficulty reaching the provider, please page the [facility] (Director on Call) for the Hospitalists listed on amion for assistance.

## 2021-01-25 NOTE — Progress Notes (Addendum)
[facility] Progress Note   Subjective:  Seen in room - says HD went "bad" yesterday but no specific complaints, net UF 3L. Denies CP or dyspnea today. Looks to have quite a bit of bloody drainage in wound vac cannister, also wound Cx growing Serratia - abx changed to Cefepime, per pharm notes.  Objective Vitals:   01/24/21 2329 01/25/21 0500 01/25/21 0502 01/25/21 0727  BP: (!) 122/57  (!) 123/101 105/61  Pulse: 94  (!) 108 (!) 113  Resp: '16  18 19  '$ Temp: 97.6 F (36.4 C)  97.7 F (36.5 C) 98.1 F (36.7 C)  TempSrc: Axillary  Oral Oral  SpO2: 94%  98% 94%  Weight:  112.4 kg    Height:       Physical Exam General: Chronically ill appearing man, NAD. Heart: RRR; no murmur Lungs: CTAB; no rales Abdomen: soft Extremities: No LLE edema; RLE edema with wound vac to R thigh Dialysis Access: [facility]  Additional Objective Labs: Basic Metabolic Panel: Recent Labs  Lab 01/19/21 0037 01/20/21 0706 01/24/21 0624 01/24/21 2054 01/25/21 0050  NA 137   < > 139 136 136  K 3.5   < > 3.6 3.5 3.6  CL 101   < > 101 97* 99  CO2 22   < > '25 27 26  '$ GLUCOSE 140*   < > 146* 185* 167*  BUN 32*   < > 28* 16 16  CREATININE 5.10*   < > 5.51* 3.70* 3.83*  CALCIUM 8.3*   < > 8.6* 8.5* 8.4*  PHOS 3.8  --   --   --   --    < > = values in this interval not displayed.   Liver Function Tests: Recent Labs  Lab 01/21/21 0613 01/22/21 0028 01/23/21 0243  AST '29 28 29  '$ ALT '6 9 9  '$ ALKPHOS 104 84 95  BILITOT 2.0* 1.6* 1.3*  PROT 5.7* 5.7* 6.5  ALBUMIN 2.2* 2.3* 2.3*   CBC: Recent Labs  Lab 01/21/21 0613 01/22/21 0028 01/23/21 0243 01/24/21 0624 01/25/21 0050  WBC 22.3* 16.6* 14.1* 13.5* 9.4  NEUTROABS 19.4* 14.2* 11.8* 11.0* 8.3*  HGB 8.0* 7.3* 7.2* 7.3* 8.3*  HCT 25.4* 23.2* 23.7* 23.9* 27.4*  MCV 88.8 88.2 91.5 91.2 92.3  PLT 239 193 215 210 235   Blood Culture    Component Value Date/Time   SDES ABSCESS 01/20/2021 1710   SPECREQUEST RIGHT THIGH ABSCESS SPEC A  01/20/2021 1710   CULT  01/20/2021 1710    FEW SERRATIA MARCESCENS NO ANAEROBES ISOLATED; CULTURE IN PROGRESS FOR 5 DAYS    REPTSTATUS PENDING 01/20/2021 1710   Medications:  ceFEPime (MAXIPIME) IV      amiodarone  200 mg Oral BID   atorvastatin  40 mg Oral QPM   calcium acetate  1,334 mg Oral TID WC   Chlorhexidine Gluconate Cloth  6 each Topical Q0600   Chlorhexidine Gluconate Cloth  6 each Topical Q0600   [START ON 01/28/2021] darbepoetin (ARANESP) injection - DIALYSIS  100 mcg Intravenous Q Fri-HD   Gerhardt's butt cream   Topical BID   insulin aspart  0-6 Units Subcutaneous Q4H   levothyroxine  50 mcg Oral Q0600   midodrine  10 mg Oral TID WC   polyethylene glycol  17 g Oral Daily   pregabalin  25 mg Oral BID    Dialysis Orders: MWF at [facility] 4hr, 450/800, EDW 117kg, 3K/2.5Ca, TDC, heparin 4900 bolus + 500/hr  Assessment/Plan: Right  thigh abscess/pyomyositis: S/p I&D + wound vac 7/14 by ortho. Wound Cx now + for Serratia -> abx changed to Cefepime. AMS: On admit, resolved/improved ESRD: Continue HD per usual MWF schedule -> next HD 7/20. Chronic hypotension - on midodrine TID Volume overload: Resolving, now well below prior dry weight, will lower on d/c. Anemia of ESRD: Hgb 8.3, continue Aranesp 118mg q Fri. 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 PAF - per cardiology to have cardioversion later this month Diastolic CHF - UF as tolerated as above.   KVeneta Penton PA-C 01/25/2021, 10:26 AM  [name]  I have seen and examined this patient and agree with plan and assessment in the above note with renal recommendations/intervention highlighted.  [name],MD 01/25/2021 2:50 PM

## 2021-01-25 NOTE — Progress Notes (Signed)
Pharmacy note:   Patient growing serratia in abscess wound. Narrowed to Ceftriaxone (CTX) on 7/18. Due to concerns for inducible ampC resistance wth CTX will change to cefepime IV.   Patient is ESRD on HD, therefore will dose cefepime 1gm IV q24h.   D/w Dr. Heloise Beecham.   Scout Guyett A. Levada Dy, PharmD, BCPS, FNKF Clinical Pharmacist [place] Please utilize Amion for appropriate phone number to reach the unit pharmacist ([place])

## 2021-01-25 NOTE — Care Management Important Message (Signed)
Important Message  Patient Details  Name: [facility] MRN: [id number] Date of Birth: 10/01/1955   Medicare Important Message Given:  Yes - Important Message mailed due to current National Emergency   Verbal consent obtained due to current National Emergency  Relationship to patient: Self Contact Name: [place] Call Date: 01/25/21  Time: 1355 Phone: [phone] Outcome: No Answer/Busy Important Message mailed to: Patient address on file    [name] 01/25/2021, 1:56 PM

## 2021-01-25 NOTE — Plan of Care (Signed)
  Problem: Education: Goal: Knowledge of General Education information will improve Description: Including pain rating scale, medication(s)/side effects and non-pharmacologic comfort measures 01/25/2021 0749 by Iverson Alamin, RN Outcome: Progressing 01/24/2021 1838 by Iverson Alamin, RN Outcome: Progressing   Problem: Health Behavior/Discharge Planning: Goal: Ability to manage health-related needs will improve 01/25/2021 0749 by Iverson Alamin, RN Outcome: Progressing 01/24/2021 1838 by Iverson Alamin, RN Outcome: Progressing   Problem: Clinical Measurements: Goal: Ability to maintain clinical measurements within normal limits will improve 01/25/2021 0749 by Iverson Alamin, RN Outcome: Progressing 01/24/2021 1838 by Iverson Alamin, RN Outcome: Progressing Goal: Will remain free from infection 01/25/2021 0749 by Iverson Alamin, RN Outcome: Progressing 01/24/2021 1838 by Iverson Alamin, RN Outcome: Progressing Goal: Diagnostic test results will improve 01/25/2021 0749 by Iverson Alamin, RN Outcome: Progressing 01/24/2021 1838 by Iverson Alamin, RN Outcome: Progressing Goal: Respiratory complications will improve 01/25/2021 0749 by Iverson Alamin, RN Outcome: Progressing 01/24/2021 1838 by Iverson Alamin, RN Outcome: Progressing Goal: Cardiovascular complication will be avoided 01/25/2021 0749 by Iverson Alamin, RN Outcome: Progressing 01/24/2021 1838 by Iverson Alamin, RN Outcome: Progressing   Problem: Activity: Goal: Risk for activity intolerance will decrease 01/25/2021 0749 by Iverson Alamin, RN Outcome: Progressing 01/24/2021 1838 by Iverson Alamin, RN Outcome: Progressing   Problem: Nutrition: Goal: Adequate nutrition will be maintained 01/25/2021 0749 by Iverson Alamin, RN Outcome: Progressing 01/24/2021 1838 by Iverson Alamin, RN Outcome: Progressing   Problem: Coping: Goal: Level of anxiety will decrease 01/25/2021 0749 by Iverson Alamin, RN Outcome:  Progressing 01/24/2021 1838 by Iverson Alamin, RN Outcome: Progressing   Problem: Elimination: Goal: Will not experience complications related to bowel motility 01/25/2021 0749 by Iverson Alamin, RN Outcome: Progressing 01/24/2021 1838 by Iverson Alamin, RN Outcome: Progressing Goal: Will not experience complications related to urinary retention 01/25/2021 0749 by Iverson Alamin, RN Outcome: Progressing 01/24/2021 1838 by Iverson Alamin, RN Outcome: Progressing   Problem: Pain Managment: Goal: General experience of comfort will improve 01/25/2021 0749 by Iverson Alamin, RN Outcome: Progressing 01/24/2021 1838 by Iverson Alamin, RN Outcome: Progressing   Problem: Safety: Goal: Ability to remain free from injury will improve 01/25/2021 0749 by Iverson Alamin, RN Outcome: Progressing 01/24/2021 1838 by Iverson Alamin, RN Outcome: Progressing   Problem: Skin Integrity: Goal: Risk for impaired skin integrity will decrease 01/25/2021 0749 by Iverson Alamin, RN Outcome: Progressing 01/24/2021 1838 by Iverson Alamin, RN Outcome: Progressing

## 2021-01-25 NOTE — Progress Notes (Signed)
Physical Therapy Treatment Patient Details Name: Kyle Walton MRN: [id number] DOB: 09/30/1955 Today's Date: 01/25/2021    History of Present Illness 65-year-old male admitted with somnolence and confusion; R thigh infection w/ pyomyositis + bilat lower leg cellulitis - sp ID R thigh abscess with placement of wound vac 7/14. Post op Acute respiratory failure with hypoxemia and hypercapnia. PMH: ESRD on HD (MWF), DM2, multiple toe amputations, morbid obesity, chronic diastolic heart failure, paroxysmal fibrillation, chronic hypoxic respiratory failure and hypothyroidism.  He is a nursing home resident.    PT Comments    Patient received in bed, adamantly refusing getting to EOB today and perseverating on working on knee ROM- tells me "what is the point of working on standing and walking if my knee won't move", very difficult to redirect from this thought pattern. Education provided on benefits of changing position/getting EOB and OOB and working on standing in his recovery. Otherwise focused on knee ROM techniques including grade 1-2 knee extension mobilization as tolerated, patella mobility, and knee flexion AAROM to tolerance. Left in bed with all needs met, alarm active. Will continue to follow.     Follow Up Recommendations  SNF     Equipment Recommendations  3in1 (PT);Wheelchair (measurements PT);Wheelchair cushion (measurements PT);Hospital bed;Other (comment) (air overlay)    Recommendations for Other Services       Precautions / Restrictions Precautions Precautions: Fall Precaution Comments: contact precautions; Also noteworthy that lateral aspect of R thigh is very painful and sensitive to even light touch, legally blind Restrictions Weight Bearing Restrictions: No Other Position/Activity Restrictions: wound vac R thigh    Mobility  Bed Mobility               General bed mobility comments: refused    Transfers                 General transfer comment:  refused  Ambulation/Gait             General Gait Details: unable   Stairs             Wheelchair Mobility    Modified Rankin (Stroke Patients Only)       Balance                               High Level Balance Comments: refused EOB/OOB today            Cognition Arousal/Alertness: Awake/alert Behavior During Therapy: WFL for tasks assessed/performed Overall Cognitive Status: Impaired/Different from baseline Area of Impairment: Attention;Memory;Following commands;Safety/judgement;Awareness;Problem solving                   Current Attention Level: Sustained Memory: Decreased short-term memory Following Commands: Follows one step commands consistently;Follows one step commands with increased time Safety/Judgement: Decreased awareness of safety;Decreased awareness of deficits Awareness: Intellectual Problem Solving: Difficulty sequencing;Requires verbal cues General Comments: still seems confused and perseverating on working on his knee ROM; refused to get to EOB or attempt standing today, tells me he understands he needs to get back up on his feet too      Exercises      General Comments        Pertinent Vitals/Pain Pain Assessment: Faces Faces Pain Scale: Hurts even more Pain Location: RLE knee to hip Pain Descriptors / Indicators: Grimacing;Guarding;Sharp Pain Intervention(s): Limited activity within patient's tolerance;Monitored during session    Home Living                        Prior Function            PT Goals (current goals can now be found in the care plan section) Acute Rehab PT Goals Patient Stated Goal: To be able to walk again PT Goal Formulation: With patient Time For Goal Achievement: 02/05/21 Potential to Achieve Goals: Fair Progress towards PT goals: Progressing toward goals    Frequency    Min 2X/week      PT Plan Current plan remains appropriate    Co-evaluation               AM-PAC PT "6 Clicks" Mobility   Outcome Measure  Help needed turning from your back to your side while in a flat bed without using bedrails?: Total Help needed moving from lying on your back to sitting on the side of a flat bed without using bedrails?: Total Help needed moving to and from a bed to a chair (including a wheelchair)?: Total Help needed standing up from a chair using your arms (e.g., wheelchair or bedside chair)?: Total Help needed to walk in hospital room?: Total Help needed climbing 3-5 steps with a railing? : Total 6 Click Score: 6    End of Session   Activity Tolerance: Patient tolerated treatment well Patient left: in bed;with call bell/phone within reach;with bed alarm set Nurse Communication: Mobility status PT Visit Diagnosis: Muscle weakness (generalized) (M62.81);Difficulty in walking, not elsewhere classified (R26.2);Pain Pain - Right/Left: Right Pain - part of body: Leg     Time: 1408-1434 PT Time Calculation (min) (ACUTE ONLY): 26 min  Charges:  $Therapeutic Activity: 8-22 mins $Self Care/Home Management: 8-22                     U PT, DPT, PN1   Supplemental Physical Therapist [place]    Pager [phone] Acute Rehab Office [phone]  

## 2021-01-25 NOTE — TOC Progression Note (Signed)
Transition of Care [facility]) - Progression Note    Patient Details  Name: Kyle Walton MRN: [id number] Date of Birth: 1956/04/01  Transition of Care [facility]) CM/SW [place], [place] Phone Number: 01/25/2021, 10:31 AM  Clinical Narrative:    Patient transferred to unit from ICU. Patient has been at [facility] for rehab prior to hospitalization. CSW will continue to follow for discharge needs, including insurance authorization for SNF and potential need for wound vac at SNF. [place] aware.    Expected Discharge Plan: [place] Barriers to Discharge: Continued Medical Work up  Expected Discharge Plan and Services Expected Discharge Plan: [place] In-house Referral: Clinical Social Work   Post Acute Care Choice: [place] Living arrangements for the past 2 months: [place]                                       Social Determinants of Health (SDOH) Interventions    Readmission Risk Interventions No flowsheet data found.

## 2021-01-26 DIAGNOSIS — I5033 Acute on chronic diastolic (congestive) heart failure: Secondary | ICD-10-CM | POA: Diagnosis not present

## 2021-01-26 DIAGNOSIS — E039 Hypothyroidism, unspecified: Secondary | ICD-10-CM | POA: Diagnosis not present

## 2021-01-26 DIAGNOSIS — G9340 Encephalopathy, unspecified: Secondary | ICD-10-CM | POA: Diagnosis not present

## 2021-01-26 LAB — GLUCOSE, CAPILLARY
Glucose-Capillary: 132 mg/dL — ABNORMAL HIGH (ref 70–99)
Glucose-Capillary: 138 mg/dL — ABNORMAL HIGH (ref 70–99)
Glucose-Capillary: 174 mg/dL — ABNORMAL HIGH (ref 70–99)
Glucose-Capillary: 193 mg/dL — ABNORMAL HIGH (ref 70–99)
Glucose-Capillary: 196 mg/dL — ABNORMAL HIGH (ref 70–99)
Glucose-Capillary: 233 mg/dL — ABNORMAL HIGH (ref 70–99)

## 2021-01-26 LAB — CBC WITH DIFFERENTIAL/PLATELET
Abs Immature Granulocytes: 0.1 10*3/uL — ABNORMAL HIGH (ref 0.00–0.07)
Basophils Absolute: 0.1 10*3/uL (ref 0.0–0.1)
Basophils Relative: 1 %
Eosinophils Absolute: 0.2 10*3/uL (ref 0.0–0.5)
Eosinophils Relative: 2 %
HCT: 26.6 % — ABNORMAL LOW (ref 39.0–52.0)
Hemoglobin: 8.1 g/dL — ABNORMAL LOW (ref 13.0–17.0)
Immature Granulocytes: 1 %
Lymphocytes Relative: 8 %
Lymphs Abs: 1.1 10*3/uL (ref 0.7–4.0)
MCH: 28.7 pg (ref 26.0–34.0)
MCHC: 30.5 g/dL (ref 30.0–36.0)
MCV: 94.3 fL (ref 80.0–100.0)
Monocytes Absolute: 1.5 10*3/uL — ABNORMAL HIGH (ref 0.1–1.0)
Monocytes Relative: 12 %
Neutro Abs: 9.8 10*3/uL — ABNORMAL HIGH (ref 1.7–7.7)
Neutrophils Relative %: 76 %
Platelets: 241 10*3/uL (ref 150–400)
RBC: 2.82 MIL/uL — ABNORMAL LOW (ref 4.22–5.81)
RDW: 19 % — ABNORMAL HIGH (ref 11.5–15.5)
WBC: 12.8 10*3/uL — ABNORMAL HIGH (ref 4.0–10.5)
nRBC: 0.2 % (ref 0.0–0.2)

## 2021-01-26 LAB — COMPREHENSIVE METABOLIC PANEL
ALT: 7 U/L (ref 0–44)
AST: 33 U/L (ref 15–41)
Albumin: 2.1 g/dL — ABNORMAL LOW (ref 3.5–5.0)
Alkaline Phosphatase: 138 U/L — ABNORMAL HIGH (ref 38–126)
Anion gap: 12 (ref 5–15)
BUN: 26 mg/dL — ABNORMAL HIGH (ref 8–23)
CO2: 24 mmol/L (ref 22–32)
Calcium: 8.4 mg/dL — ABNORMAL LOW (ref 8.9–10.3)
Chloride: 100 mmol/L (ref 98–111)
Creatinine, Ser: 5.31 mg/dL — ABNORMAL HIGH (ref 0.61–1.24)
GFR, Estimated: 11 mL/min — ABNORMAL LOW (ref >60)
Glucose, Bld: 153 mg/dL — ABNORMAL HIGH (ref 70–99)
Potassium: 3.6 mmol/L (ref 3.5–5.1)
Sodium: 136 mmol/L (ref 135–145)
Total Bilirubin: 0.9 mg/dL (ref 0.3–1.2)
Total Protein: 6.1 g/dL — ABNORMAL LOW (ref 6.5–8.1)

## 2021-01-26 MED ORDER — MIDODRINE HCL 5 MG PO TABS
ORAL_TABLET | ORAL | Status: AC
  Filled 2021-01-26: qty 2

## 2021-01-26 MED ORDER — HEPARIN SODIUM (PORCINE) 1000 UNIT/ML IJ SOLN
INTRAMUSCULAR | Status: AC
  Filled 2021-01-26: qty 5

## 2021-01-26 MED ORDER — ALBUMIN HUMAN 25 % IV SOLN
INTRAVENOUS | Status: AC
  Administered 2021-01-26: 25 g via INTRAVENOUS
  Filled 2021-01-26: qty 100

## 2021-01-26 MED ORDER — APIXABAN 5 MG PO TABS
5.0000 mg | ORAL_TABLET | Freq: Two times a day (BID) | ORAL | Status: DC
  Administered 2021-01-26 – 2021-01-28 (×5): 5 mg via ORAL
  Filled 2021-01-26 (×5): qty 1

## 2021-01-26 MED ORDER — HYDROMORPHONE HCL 1 MG/ML IJ SOLN
INTRAMUSCULAR | Status: AC
  Administered 2021-01-26: 0.5 mg
  Filled 2021-01-26: qty 0.5

## 2021-01-26 MED ORDER — CLOPIDOGREL BISULFATE 75 MG PO TABS
75.0000 mg | ORAL_TABLET | Freq: Every day | ORAL | Status: DC
  Administered 2021-01-27 – 2021-01-28 (×2): 75 mg via ORAL
  Filled 2021-01-26 (×3): qty 1

## 2021-01-26 MED ORDER — ALBUMIN HUMAN 25 % IV SOLN: 25.0000 g | Freq: Once | INTRAVENOUS | Status: AC

## 2021-01-26 NOTE — Progress Notes (Signed)
PT Cancellation Note  Patient Details Name: [name] MRN: [id number] DOB: 1955-12-19   Cancelled Treatment:    Reason Eval/Treat Not Completed: Other (comment) was in HD earlier, now eating lunch and unavailable for PT. Will continue efforts on next date of service.   Windell Norfolk, DPT, PN1   Supplemental Physical Therapist [facility]    Pager [phone] Acute Rehab Office [phone]

## 2021-01-26 NOTE — Progress Notes (Signed)
   Subjective:  Patient reports pain as mild.  Significant improvement from preoperative status.  Objective:   VITALS:   Vitals:   01/26/21 1130 01/26/21 1220 01/26/21 1345 01/26/21 1631  BP: (!) 103/54 (!) 113/58 110/62 (!) 101/52  Pulse: (!) 102 (!) 104 (!) 107 (!) 108  Resp:   17 19  Temp:  (!) 97.5 F (36.4 C) 97.6 F (36.4 C) 97.6 F (36.4 C)  TempSrc:  Oral Oral Oral  SpO2:  97% 100% 92%  Weight:  109 kg    Height:        Right lower leg -Some maceration of the midportion of the laterally based incision.  However, no obvious drainage.  Some blood coming from the distal aspect of the incision.  No purulence.  No induration or fluctuance around the incision.  No erythema.  JP drain clipped and pulled from the proximal aspect.  Dry dressings applied.  Distally at his baseline.  Lab Results  Component Value Date   WBC 12.8 (H) 01/26/2021   HGB 8.1 (L) 01/26/2021   HCT 26.6 (L) 01/26/2021   MCV 94.3 01/26/2021   PLT 241 01/26/2021   BMET    Component Value Date/Time   NA 136 01/26/2021 0228   K 3.6 01/26/2021 0228   CL 100 01/26/2021 0228   CO2 24 01/26/2021 0228   GLUCOSE 153 (H) 01/26/2021 0228   BUN 26 (H) 01/26/2021 0228   CREATININE 5.31 (H) 01/26/2021 0228   CALCIUM 8.4 (L) 01/26/2021 0228   GFRNONAA 11 (L) 01/26/2021 0228   GFRAA 6 (L) 01/17/2020 1605     Assessment/Plan: 6 Days Post-Op   Principal Problem:   Acute encephalopathy Active Problems:   Diabetes mellitus with peripheral vascular disease ([place])   History of DVT (deep vein thrombosis)   OSA (obstructive sleep apnea)   Paroxysmal atrial fibrillation (HCC)   ESRD on hemodialysis (HCC)   Impaired physical mobility   Acute on chronic respiratory failure with hypoxia and hypercapnia (HCC)   Cellulitis of right leg   Acute on chronic diastolic (congestive) heart failure (HCC)   High anion gap metabolic acidosis   Hypotension   Acquired hypothyroidism   Dry gangrene (HCC)   Sepsis,  unspecified organism ([place])   Anemia of renal disease  -All drains removed today.  -Please apply daily dry dressings with ABD pads to the lateral incision and the drain site.  Expect moderate drainage from the drain site over the next 24 to 48 hours.  -Weightbearing as tolerated to the right lower extremity.  -Okay to go ahead with an initiation/resumption of presurgical anticoagulation therapies.  -We will sign off at this time.  Please have Mr. Rosenbauer follow-up with me in the office in 10 to 14 days for suture removal. Nicholes Stairs 01/26/2021, 5:39 PM   Geralynn Rile, MD [phone]

## 2021-01-26 NOTE — Progress Notes (Signed)
OT Cancellation Note  Patient Details Name: Kyle Walton MRN: [id number] DOB: 28-Dec-1955   Cancelled Treatment:    Reason Eval/Treat Not Completed: Patient at procedure or test/ unavailable (HD)  [name] 01/26/2021, 10:58 AM Nestor Lewandowsky, OTR/L Acute Rehabilitation Services Pager: [phone] Office: [phone]

## 2021-01-26 NOTE — Progress Notes (Signed)
ANTICOAGULATION CONSULT NOTE - Initial Consult  Pharmacy Consult for apixaban Indication: h/o VTE (PE)  Allergies  Allergen Reactions   Morphine Other (See Comments)    Per son, [name], pt becomes unresponsive/disoriented. Especially when given after HD tx   Liraglutide Diarrhea    Phenol Phenol     Patient Measurements: Height: '5\' 11"'$  (180.3 cm) Weight: 111.3 kg (245 lb 6 oz) IBW/kg (Calculated) : 75.3   Vital Signs: Temp: 97.2 F (36.2 C) (07/20 0840) Temp Source: Oral (07/20 0840) BP: 91/55 (07/20 1000) Pulse Rate: 109 (07/20 1000)  Labs: Recent Labs    01/24/21 0624 01/24/21 2054 01/25/21 0050 01/26/21 0228  HGB 7.3*  --  8.3* 8.1*  HCT 23.9*  --  27.4* 26.6*  PLT 210  --  235 241  CREATININE 5.51* 3.70* 3.83* 5.31*    Estimated Creatinine Clearance: 17.6 mL/min (A) (by C-G formula based on SCr of 5.31 mg/dL (H)).   Medical History: Past Medical History:  Diagnosis Date   Diabetes mellitus without complication (HCC)    Diastolic heart failure (HCC)    ESRD (end stage renal disease) (HCC)    MI (myocardial infarction) (HCC)    OSA (obstructive sleep apnea)    PAF (paroxysmal atrial fibrillation) (HCC)    Renal disorder    on dialysis    Assessment: 65 yo male with h/o VTE on Apixaban PTA. Apixaban held for hematoma/abscess requiring I&D and continued to be held in anticipation of further surgery. Patient now with no plans for further surgery per ortho. MD wishes to restart apixaban.   Goal of Therapy:   Monitor platelets by anticoagulation protocol: Yes   Plan:  Restart PTA Apixaban '5mg'$  PO BID Monitor for bleeding Pharmacy will sign off.   [place] A. Levada Dy, PharmD, BCPS, FNKF Clinical Pharmacist [place] Please utilize Amion for appropriate phone number to reach the unit pharmacist ([place])  01/26/2021,10:31 AM

## 2021-01-26 NOTE — Procedures (Signed)
I was present at this dialysis session. I have reviewed the session itself and made appropriate changes.   Vital signs in last 24 hours:  Temp:  [97.2 F (36.2 C)-98.6 F (37 C)] 97.2 F (36.2 C) (07/20 0840) Pulse Rate:  [92-119] 109 (07/20 0930) Resp:  [15-45] 20 (07/20 0930) BP: (86-115)/(48-67) 86/52 (07/20 0930) SpO2:  [88 %-100 %] 95 % (07/20 0840) Weight:  [111.3 kg-114.2 kg] 111.3 kg (07/20 0840) Weight change: 1.3 kg Filed Weights   01/25/21 0500 01/26/21 0500 01/26/21 0840  Weight: 112.4 kg 114.2 kg 111.3 kg    Recent Labs  Lab 01/26/21 0228  NA 136  K 3.6  CL 100  CO2 24  GLUCOSE 153*  BUN 26*  CREATININE 5.31*  CALCIUM 8.4*    Recent Labs  Lab 01/24/21 0624 01/25/21 0050 01/26/21 0228  WBC 13.5* 9.4 12.8*  NEUTROABS 11.0* 8.3* 9.8*  HGB 7.3* 8.3* 8.1*  HCT 23.9* 27.4* 26.6*  MCV 91.2 92.3 94.3  PLT 210 235 241    Scheduled Meds:  amiodarone  200 mg Oral BID   atorvastatin  40 mg Oral QPM   calcium acetate  1,334 mg Oral TID WC   Chlorhexidine Gluconate Cloth  6 each Topical Q0600   Chlorhexidine Gluconate Cloth  6 each Topical Q0600   [START ON 01/28/2021] darbepoetin (ARANESP) injection - DIALYSIS  100 mcg Intravenous Q Fri-HD   Gerhardt's butt cream   Topical BID   insulin aspart  0-6 Units Subcutaneous Q4H   levothyroxine  50 mcg Oral Q0600   midodrine  10 mg Oral TID WC   mupirocin ointment   Nasal BID   polyethylene glycol  17 g Oral Daily   pregabalin  25 mg Oral BID   Continuous Infusions:  ceFEPime (MAXIPIME) IV 1 g (01/25/21 2111)   PRN Meds:.acetaminophen **OR** acetaminophen, diphenhydrAMINE, HYDROmorphone (DILAUDID) injection, oxyCODONE, sodium chloride flush   Kyle Potts,  MD 01/26/2021, 9:47 AM

## 2021-01-26 NOTE — Plan of Care (Signed)

## 2021-01-26 NOTE — Progress Notes (Signed)
PROGRESS NOTE  Kyle Walton [id number] DOB: Jul 05, 1956 DOA: 01/17/2021 PCP: Patient, No Pcp Per (Inactive)   LOS: 8 days   Brief Narrative / Interim history: 65 year old male who was admitted on 7/11 with right leg pain and swelling.  He was recently hospitalized 5/20 7-12/2022 for right lower extremity cellulitis.  He was found to be lethargic in the ED and an ABG showed respiratory acidosis requiring BiPAP.  Chest x-ray showed evidence of fluid overload, WBC was up to 20 and was placed on antibiotics.  He was initially in the ICU then eventually transferred to the hospitalist service.  He was found to have significant right thigh hematoma and was taken to the OR on 7/14 status postdebridement.  He now has a wound VAC in place  Subjective / 24h Interval events: He is doing well this morning, no significant complaints.  He is wondering what the plan is  Assessment & Plan: Principal Problem Sepsis due to right leg cellulitis, right intramuscular thigh abscess-orthopedic surgery was consulted and patient was taken to the OR on 7/14 by Dr. Stann Mainland and he is status post excisional debridement and irrigation for right intramuscular thigh abscess. -He has a wound VAC placed as well as a drain.  Discussed with Dr. Stann Mainland, he will reevaluate today regarding removal of the drain and further care. -Microbiology from the abscess showed Serratia marcescens this resistant to cefazolin but otherwise sensitive.  Discussed with infectious disease, recommending no more than 14 days treatment postoperatively, he has completed 6 days with 8 additional days remaining.  On discharge he can be transitioned to ciprofloxacin  Active Problems ESRD on HD-nephrology consulted, HD per schedule.  PAF-probably resume Eliquis once Ortho does not plan any further interventions.  Continue amiodarone  Hypothyroidism-continue Synthroid  Hypotension-stable, continue chronic midodrine  Anemia of renal disease-stable, no  bleeding  Acute metabolic encephalopathy-due to sepsis, severe illness.  Now resolved  DM2, peripheral vascular disease  CBG (last 3)  Recent Labs    01/26/21 0009 01/26/21 0401 01/26/21 0728  GLUCAP 193* 132* 138*   Acute on chronic diastolic (congestive) heart failure (HCC)-euvolemic, fluid status managed with dialysis  Acute on chronic respiratory failure with hypoxia and hypercapnia (HCC) -Required continued MV post-operatively.    OSA (obstructive sleep apnea) -BiPAP nightly   History of DVT (deep vein thrombosis) - Anticoagulation on hold in case of further procedures.   Impaired physical mobility -PT recommending SNF.   Scheduled Meds:  amiodarone  200 mg Oral BID   atorvastatin  40 mg Oral QPM   calcium acetate  1,334 mg Oral TID WC   Chlorhexidine Gluconate Cloth  6 each Topical Q0600   Chlorhexidine Gluconate Cloth  6 each Topical Q0600   [START ON 01/28/2021] darbepoetin (ARANESP) injection - DIALYSIS  100 mcg Intravenous Q Fri-HD   Gerhardt's butt cream   Topical BID   insulin aspart  0-6 Units Subcutaneous Q4H   levothyroxine  50 mcg Oral Q0600   midodrine  10 mg Oral TID WC   mupirocin ointment   Nasal BID   polyethylene glycol  17 g Oral Daily   pregabalin  25 mg Oral BID   Continuous Infusions:  ceFEPime (MAXIPIME) IV 1 g (01/25/21 2111)   PRN Meds:.acetaminophen **OR** acetaminophen, diphenhydrAMINE, HYDROmorphone (DILAUDID) injection, oxyCODONE, sodium chloride flush  Diet Orders (From admission, onward)     Start     Ordered   01/22/21 0933  Diet regular Room service appropriate? Yes with Assist; Fluid consistency: Thin  Diet effective now       Question Answer Comment  Room service appropriate? Yes with Assist   Fluid consistency: Thin      01/22/21 0933            DVT prophylaxis:      Code Status: Full Code  Family Communication: No family at bedside  Status is: Inpatient  Remains inpatient appropriate because:Inpatient level  of care appropriate due to severity of illness  Dispo: The patient is from: Home              Anticipated d/c is to: Home              Patient currently is not medically stable to d/c.   Difficult to place patient No   Level of care: Med-Surg  Consultants:  Orthopedic surgery  Procedures:  7/14 I&D  Microbiology  Wound cultures with Serratia  Antimicrobials: Currently on cefepime   Objective: Vitals:   01/26/21 0845 01/26/21 0900 01/26/21 0930 01/26/21 1000  BP: (!) 89/58 (!) 87/55 (!) 86/52 (!) 91/55  Pulse: (!) 108 (!) 111 (!) 109 (!) 109  Resp: 20 (!) 45 20 19  Temp:      TempSrc:      SpO2:      Weight:      Height:        Intake/Output Summary (Last 24 hours) at 01/26/2021 1008 Last data filed at 01/26/2021 0827 Gross per 24 hour  Intake 1399.1 ml  Output 485 ml  Net 914.1 ml   Filed Weights   01/25/21 0500 01/26/21 0500 01/26/21 0840  Weight: 112.4 kg 114.2 kg 111.3 kg    Examination:  Constitutional: NAD Eyes: no scleral icterus ENMT: Mucous membranes are moist.  Neck: normal, supple Respiratory: clear to auscultation bilaterally, no wheezing, no crackles. Normal respiratory effort.  Cardiovascular: Regular rate and rhythm, no murmurs / rubs / gallops. No LE edema. Abdomen: non distended, no tenderness. Bowel sounds positive.  Musculoskeletal: no clubbing / cyanosis.  Skin: no rashes Neurologic: CN 2-12 grossly intact. Strength 5/5 in all 4.    Data Reviewed: I have independently reviewed following labs and imaging studies   CBC: Recent Labs  Lab 01/22/21 0028 01/23/21 0243 01/24/21 0624 01/25/21 0050 01/26/21 0228  WBC 16.6* 14.1* 13.5* 9.4 12.8*  NEUTROABS 14.2* 11.8* 11.0* 8.3* 9.8*  HGB 7.3* 7.2* 7.3* 8.3* 8.1*  HCT 23.2* 23.7* 23.9* 27.4* 26.6*  MCV 88.2 91.5 91.2 92.3 94.3  PLT 193 215 210 235 [id number]   Basic Metabolic Panel: Recent Labs  Lab 01/23/21 0243 01/24/21 0624 01/24/21 2054 01/25/21 0050 01/26/21 0228  NA 140 139  136 136 136  K 3.8 3.6 3.5 3.6 3.6  CL 100 101 97* 99 100  CO2 '28 25 27 26 24  '$ GLUCOSE 165* 146* 185* 167* 153*  BUN 24* 28* 16 16 26*  CREATININE 4.45* 5.51* 3.70* 3.83* 5.31*  CALCIUM 9.0 8.6* 8.5* 8.4* 8.4*  MG  --   --  1.8  --   --    Liver Function Tests: Recent Labs  Lab 01/20/21 0706 01/21/21 0613 01/22/21 0028 01/23/21 0243 01/26/21 0228  AST '28 29 28 29 '$ 33  ALT '9 6 9 9 7  '$ ALKPHOS 107 104 84 95 138*  BILITOT 1.5* 2.0* 1.6* 1.3* 0.9  PROT 6.4* 5.7* 5.7* 6.5 6.1*  ALBUMIN 2.3* 2.2* 2.3* 2.3* 2.1*   Coagulation Profile: No results for input(s): INR, PROTIME in the last 168 hours. HbA1C:  No results for input(s): HGBA1C in the last 72 hours. CBG: Recent Labs  Lab 01/25/21 2114 01/25/21 2159 01/26/21 0009 01/26/21 0401 01/26/21 0728  GLUCAP 217* 209* 193* 132* 138*    Recent Results (from the past 240 hour(s))  Blood culture (routine x 2)     Status: None   Collection Time: 01/17/21  9:38 PM   Specimen: BLOOD  Result Value Ref Range Status   Specimen Description BLOOD RIGHT ARM  Final   Special Requests   Final    BOTTLES DRAWN AEROBIC AND ANAEROBIC Blood Culture adequate volume   Culture   Final    NO GROWTH 5 DAYS Performed at [facility], [place] [address], [place], [place] [address]    Report Status 01/22/2021 FINAL  Final  Blood culture (routine x 2)     Status: None   Collection Time: 01/17/21  9:40 PM   Specimen: BLOOD  Result Value Ref Range Status   Specimen Description BLOOD SITE NOT SPECIFIED  Final   Special Requests   Final    BOTTLES DRAWN AEROBIC AND ANAEROBIC Blood Culture adequate volume   Culture   Final    NO GROWTH 5 DAYS Performed at [facility], [address] [place], [place] [address]    Report Status 01/22/2021 FINAL  Final  Resp Panel by RT-PCR (Flu A&B, Covid) Nasopharyngeal Swab     Status: None   Collection Time: 01/17/21  9:50 PM   Specimen: Nasopharyngeal Swab; Nasopharyngeal(NP) swabs in vial transport  medium  Result Value Ref Range Status   SARS Coronavirus 2 by RT PCR NEGATIVE NEGATIVE Final    Comment: (NOTE) SARS-CoV-2 target nucleic acids are NOT DETECTED.  The SARS-CoV-2 RNA is generally detectable in upper respiratory specimens during the acute phase of infection. The lowest concentration of SARS-CoV-2 viral copies this assay can detect is 138 copies/mL. A negative result does not preclude SARS-Cov-2 infection and should not be used as the sole basis for treatment or other patient management decisions. A negative result may occur with  improper specimen collection/handling, submission of specimen other than nasopharyngeal swab, presence of viral mutation(s) within the areas targeted by this assay, and inadequate number of viral copies(<138 copies/mL). A negative result must be combined with clinical observations, patient history, and epidemiological information. The expected result is Negative.  Fact Sheet for Patients:  EntrepreneurPulse.com.au  Fact Sheet for Healthcare Providers:  IncredibleEmployment.be  This test is no t yet approved or cleared by the [place] FDA and  has been authorized for detection and/or diagnosis of SARS-CoV-2 by FDA under an Emergency Use Authorization (EUA). This EUA will remain  in effect (meaning this test can be used) for the duration of the COVID-19 declaration under Section 564(b)(1) of the Act, 21 U.S.C.section 360bbb-3(b)(1), unless the authorization is terminated  or revoked sooner.       Influenza A by PCR NEGATIVE NEGATIVE Final   Influenza B by PCR NEGATIVE NEGATIVE Final    Comment: (NOTE) The Xpert Xpress SARS-CoV-2/FLU/RSV plus assay is intended as an aid in the diagnosis of influenza from Nasopharyngeal swab specimens and should not be used as a sole basis for treatment. Nasal washings and aspirates are unacceptable for Xpert Xpress SARS-CoV-2/FLU/RSV testing.  Fact Sheet for  Patients: EntrepreneurPulse.com.au  Fact Sheet for Healthcare Providers: IncredibleEmployment.be  This test is not yet approved or cleared by the [place] FDA and has been authorized for detection and/or diagnosis of SARS-CoV-2 by FDA under  an Emergency Use Authorization (EUA). This EUA will remain in effect (meaning this test can be used) for the duration of the COVID-19 declaration under Section 564(b)(1) of the Act, 21 U.S.C. section 360bbb-3(b)(1), unless the authorization is terminated or revoked.  Performed at [facility], [place] [address], [place], [place] [address]   Aerobic/Anaerobic Culture w Gram Stain (surgical/deep wound)     Status: None   Collection Time: 01/20/21  5:10 PM   Specimen: Wound; Abscess  Result Value Ref Range Status   Specimen Description ABSCESS  Final   Special Requests RIGHT THIGH ABSCESS SPEC A  Final   Gram Stain   Final    FEW WBC PRESENT,BOTH PMN AND MONONUCLEAR FEW GRAM NEGATIVE RODS    Culture   Final    FEW SERRATIA MARCESCENS NO ANAEROBES ISOLATED Performed at [facility], [address] [place], [place] [address]    Report Status 01/25/2021 FINAL  Final   Organism ID, Bacteria SERRATIA MARCESCENS  Final      Susceptibility   Serratia marcescens - MIC*    CEFAZOLIN >=64 RESISTANT Resistant     CEFEPIME <=0.12 SENSITIVE Sensitive     CEFTAZIDIME <=1 SENSITIVE Sensitive     CEFTRIAXONE <=0.25 SENSITIVE Sensitive     CIPROFLOXACIN <=0.25 SENSITIVE Sensitive     GENTAMICIN <=1 SENSITIVE Sensitive     TRIMETH/SULFA <=20 SENSITIVE Sensitive     * FEW SERRATIA MARCESCENS  Surgical pcr screen     Status: Abnormal   Collection Time: 01/20/21  6:31 PM   Specimen: Nasal Mucosa; Nasal Swab  Result Value Ref Range Status   MRSA, PCR POSITIVE (A) NEGATIVE Final    Comment: RESULT CALLED TO, READ BACK BY AND VERIFIED WITH: SAVAGE RN '@2024'$  01/20/21 EB    Staphylococcus aureus POSITIVE (A)  NEGATIVE Final    Comment: (NOTE) The Xpert SA Assay (FDA approved for NASAL specimens in patients 21 years of age and older), is one component of a comprehensive surveillance program. It is not intended to diagnose infection nor to guide or monitor treatment. Performed at [facility], [place] [address], [place], [place] [address]      Radiology Studies: No results found.  Marzetta Board, MD, PhD [facility]  Between 7 am - 7 pm I am available, please contact me via Amion (for emergencies) or Securechat (non urgent messages)  Between 7 pm - 7 am I am not available, please contact night coverage MD/APP via Amion

## 2021-01-27 DIAGNOSIS — I5033 Acute on chronic diastolic (congestive) heart failure: Secondary | ICD-10-CM | POA: Diagnosis not present

## 2021-01-27 DIAGNOSIS — G9340 Encephalopathy, unspecified: Secondary | ICD-10-CM | POA: Diagnosis not present

## 2021-01-27 DIAGNOSIS — E039 Hypothyroidism, unspecified: Secondary | ICD-10-CM | POA: Diagnosis not present

## 2021-01-27 LAB — RESP PANEL BY RT-PCR (FLU A&B, COVID) ARPGX2
Influenza A by PCR: NEGATIVE
Influenza B by PCR: NEGATIVE
SARS Coronavirus 2 by RT PCR: NEGATIVE

## 2021-01-27 LAB — CBC WITH DIFFERENTIAL/PLATELET
Abs Immature Granulocytes: 0 10*3/uL (ref 0.00–0.07)
Basophils Absolute: 0 10*3/uL (ref 0.0–0.1)
Basophils Relative: 0 %
Eosinophils Absolute: 0.2 10*3/uL (ref 0.0–0.5)
Eosinophils Relative: 2 %
HCT: 26 % — ABNORMAL LOW (ref 39.0–52.0)
Hemoglobin: 7.8 g/dL — ABNORMAL LOW (ref 13.0–17.0)
Lymphocytes Relative: 8 %
Lymphs Abs: 0.9 10*3/uL (ref 0.7–4.0)
MCH: 28.2 pg (ref 26.0–34.0)
MCHC: 30 g/dL (ref 30.0–36.0)
MCV: 93.9 fL (ref 80.0–100.0)
Monocytes Absolute: 1 10*3/uL (ref 0.1–1.0)
Monocytes Relative: 9 %
Neutro Abs: 8.7 10*3/uL — ABNORMAL HIGH (ref 1.7–7.7)
Neutrophils Relative %: 81 %
Platelets: 226 10*3/uL (ref 150–400)
RBC: 2.77 MIL/uL — ABNORMAL LOW (ref 4.22–5.81)
RDW: 18.8 % — ABNORMAL HIGH (ref 11.5–15.5)
WBC: 10.7 10*3/uL — ABNORMAL HIGH (ref 4.0–10.5)
nRBC: 0.2 % (ref 0.0–0.2)

## 2021-01-27 LAB — GLUCOSE, CAPILLARY
Glucose-Capillary: 139 mg/dL — ABNORMAL HIGH (ref 70–99)
Glucose-Capillary: 160 mg/dL — ABNORMAL HIGH (ref 70–99)
Glucose-Capillary: 162 mg/dL — ABNORMAL HIGH (ref 70–99)
Glucose-Capillary: 168 mg/dL — ABNORMAL HIGH (ref 70–99)
Glucose-Capillary: 181 mg/dL — ABNORMAL HIGH (ref 70–99)
Glucose-Capillary: 182 mg/dL — ABNORMAL HIGH (ref 70–99)
Glucose-Capillary: 185 mg/dL — ABNORMAL HIGH (ref 70–99)

## 2021-01-27 LAB — TRIGLYCERIDES: Triglycerides: 101 mg/dL (ref <150)

## 2021-01-27 MED ORDER — DARBEPOETIN ALFA 200 MCG/0.4ML IJ SOSY: 200.0000 ug | PREFILLED_SYRINGE | INTRAMUSCULAR | Status: DC

## 2021-01-27 NOTE — Progress Notes (Signed)
Occupational Therapy Treatment Patient Details Name: Kyle Walton MRN: [id number] DOB: 1955-08-04 Today's Date: 01/27/2021    History of present illness 65 year old male admitted with somnolence and confusion; R thigh infection w/ pyomyositis + bilat lower leg cellulitis - sp ID R thigh abscess with placement of wound vac 7/14. Post op Acute respiratory failure with hypoxemia and hypercapnia. PMH: ESRD on HD (MWF), DM2, multiple toe amputations, morbid obesity, chronic diastolic heart failure, paroxysmal fibrillation, chronic hypoxic respiratory failure and hypothyroidism.  He is a nursing home resident.   OT comments  Pt received in bed asleep, easy to arouse with tactile and verbal stim. Agreeable to OT/PT session to address OOB activity and functional transitions. Pt reports cleaning due to release of bowels. Pt able to assist with rolling in bed with total A for pericare and changing of bed pad. Pt required mod A +2 supine to sit for trunk elevation. Min A sitting balance for set up with oral care and grooming some fatigue observed with posterior lean. Pt required Total A +2 for sit to stand with RW cues for hand placement, able to come to partial standing unable to fully extend hips to stand. Once presented back to sitting, observed bleeding from wound and reported to RN. Pt presented back to supine with all needs met and left with RN. DC recommendation and frequency remains the same.    Follow Up Recommendations  SNF    Equipment Recommendations  3 in 1 bedside commode;Tub/shower bench;Wheelchair (measurements OT);Wheelchair cushion (measurements OT);Hospital bed    Recommendations for Other Services      Precautions / Restrictions Precautions Precautions: Fall Precaution Comments: contact precautions; Also noteworthy that lateral aspect of R thigh is very painful and sensitive to even light touch, legally blind Restrictions Weight Bearing Restrictions: No Other Position/Activity  Restrictions: wound vac removed 7/20       Mobility Bed Mobility Overal bed mobility: Needs Assistance Bed Mobility: Rolling;Supine to Sit;Sit to Supine Rolling: Mod assist   Supine to sit: Mod assist;+2 for physical assistance;HOB elevated Sit to supine: Max assist;+2 for physical assistance   General bed mobility comments: better initiation and ability to sequence to EOB, still heavy assist to shift weight and bring trunk up to midline    Transfers Overall transfer level: Needs assistance Equipment used: Rolling walker (2 wheeled) Transfers: Sit to/from Stand Sit to Stand: Total assist;+2 physical assistance;From elevated surface         General transfer comment: totalAx2 and elevated bed to boost to just a partial stand- but able to clear buttocks from bed, just too weak to fully extend hips just yet.    Balance Overall balance assessment: Needs assistance Sitting-balance support: Feet supported Sitting balance-Leahy Scale: Fair Sitting balance - Comments: MinA for dynamic sitting balance Postural control: Posterior lean Standing balance support: Bilateral upper extremity supported;During functional activity Standing balance-Leahy Scale: Zero Standing balance comment: unable today                           ADL either performed or assessed with clinical judgement   ADL Overall ADL's : Needs assistance/impaired     Grooming: Minimal assistance;Sitting;Oral care;Wash/dry face Grooming Details (indicate cue type and reason): At EOB (Pt becomes fatigued and demonstrates posterior lean, requires VC's and min A to present back to midline.)                     Toileting- Clothing Manipulation and Hygiene:  Total assistance;Bed level Toileting - Clothing Manipulation Details (indicate cue type and reason): Pt requested assist with hygiene post bed level bowel void. Pt able to assist with rollng RT and LT with draw pad pull, and required Total A for  pericare     Functional mobility during ADLs:  (unable to attempt)       Vision   Vision Assessment?: No apparent visual deficits   Perception     Praxis      Cognition Arousal/Alertness: Awake/alert Behavior During Therapy: WFL for tasks assessed/performed Overall Cognitive Status: Impaired/Different from baseline Area of Impairment: Attention;Memory;Following commands;Safety/judgement;Awareness;Problem solving                 Orientation Level: Time;Place;Situation Current Attention Level: Sustained Memory: Decreased short-term memory Following Commands: Follows one step commands consistently;Follows one step commands with increased time Safety/Judgement: Decreased awareness of safety;Decreased awareness of deficits Awareness: Intellectual Problem Solving: Difficulty sequencing;Requires verbal cues General Comments: still confused but able to have functional conversation with me today- did not remember me from prior sessions, but did remember our plan to alternate working on knee ROM/general mobility, as well as plan for EOB/OOB this afternoon        Exercises     Shoulder Instructions       General Comments during standing pt observed to have draining from wound, requiring changing of bandage. RN advised.    Pertinent Vitals/ Pain       Pain Assessment: Faces Faces Pain Scale: Hurts little more Pain Location: RLE knee to hip Pain Descriptors / Indicators: Discomfort;Sore Pain Intervention(s): Limited activity within patient's tolerance;Monitored during session  Home Living                                          Prior Functioning/Environment              Frequency  Min 2X/week        Progress Toward Goals  OT Goals(current goals can now be found in the care plan section)  Progress towards OT goals: Progressing toward goals  Acute Rehab OT Goals Patient Stated Goal: To be able to walk again OT Goal Formulation: Patient  unable to participate in goal setting Time For Goal Achievement: 02/05/21 Potential to Achieve Goals: Fair ADL Goals Pt Will Perform Grooming: with set-up;sitting Pt Will Perform Lower Body Bathing: bed level;with mod assist Pt Will Transfer to Toilet: bedside commode;squat pivot transfer;with mod assist;with +2 assist Pt/caregiver will Perform Home Exercise Program: Increased strength;Both right and left upper extremity;With written HEP provided;With Supervision;With theraband Additional ADL Goal #1: Tolerate OOB to chair x 2 hrs to increase time OOB to chair for HD Additional ADL Goal #2: Pt will sit EOB x3 mins with minA for support in prep for seated ADL Additional ADL Goal #4: Pt will increase to x5 mins of standing in order to perform OOB ADL task with use of least restrictive AD/DME.  Plan Discharge plan remains appropriate    Co-evaluation    PT/OT/SLP Co-Evaluation/Treatment: Yes Reason for Co-Treatment: Complexity of the patient's impairments (multi-system involvement);For patient/therapist safety;To address functional/ADL transfers   OT goals addressed during session: ADL's and self-care;Strengthening/ROM      AM-PAC OT "6 Clicks" Daily Activity     Outcome Measure   Help from another person eating meals?: A Little Help from another person taking care of personal grooming?: A Little Help  from another person toileting, which includes using toliet, bedpan, or urinal?: Total Help from another person bathing (including washing, rinsing, drying)?: A Lot Help from another person to put on and taking off regular upper body clothing?: A Lot Help from another person to put on and taking off regular lower body clothing?: Total 6 Click Score: 12    End of Session Equipment Utilized During Treatment: Rolling walker;Oxygen  OT Visit Diagnosis: Other abnormalities of gait and mobility (R26.89);Muscle weakness (generalized) (M62.81);Other symptoms and signs involving cognitive  function;Pain Pain - Right/Left: Right Pain - part of body: Leg   Activity Tolerance Patient limited by fatigue;Patient limited by pain   Patient Left in bed;with call bell/phone within reach;with nursing/sitter in room (chair position)   Nurse Communication Mobility status;Need for lift equipment        Time: 1552-0802 OT Time Calculation (min): 31 min  Charges: OT General Charges $OT Visit: 1 Visit OT Treatments $Self Care/Home Management : 8-22 mins  Minus Breeding, MSOT, OTR/L  Supplemental Rehabilitation Services  [phone]    Marius Ditch 01/27/2021, 3:33 PM

## 2021-01-27 NOTE — Progress Notes (Signed)
PROGRESS NOTE  Kyle Walton [id number] DOB: 1956-01-25 DOA: 01/17/2021 PCP: Patient, No Pcp Per (Inactive)   LOS: 9 days   Brief Narrative / Interim history: 65 year old male who was admitted on 7/11 with right leg pain and swelling.  He was recently hospitalized 5/20 7-12/2022 for right lower extremity cellulitis.  He was found to be lethargic in the ED and an ABG showed respiratory acidosis requiring BiPAP.  Chest x-ray showed evidence of fluid overload, WBC was up to 20 and was placed on antibiotics.  He was initially in the ICU then eventually transferred to the hospitalist service.  He was found to have significant right thigh hematoma and was taken to the OR on 7/14 status postdebridement.  He now has a wound VAC in place  Subjective / 24h Interval events: No complaints this morning, doing well, eating breakfast  Assessment & Plan: Principal Problem Sepsis due to right leg cellulitis, right intramuscular thigh abscess-orthopedic surgery was consulted and patient was taken to the OR on 7/14 by Dr. Stann Mainland and he is status post excisional debridement and irrigation for right intramuscular thigh abscess. -He has a wound VAC placed as well as a drain which were removed by Dr. Stann Mainland 7/20 -Microbiology from the abscess showed Serratia marcescens this resistant to cefazolin but otherwise sensitive.  Discussed with infectious disease, recommending no more than 14 days treatment postoperatively, he has completed 7 days with 7 additional days remaining.  On discharge he can be transitioned to ciprofloxacin -Going to SNF tomorrow per social worker  Active Problems ESRD on HD-nephrology consulted, HD per schedule.  PAF-resumed Eliquis, continue amiodarone  Hypothyroidism-continue Synthroid  Hypotension-stable, continue chronic midodrine  Anemia of renal disease-stable, no bleeding  Acute metabolic encephalopathy-due to sepsis, severe illness.  Now resolved  DM2, peripheral vascular  disease  CBG (last 3)  Recent Labs    01/27/21 0406 01/27/21 0819 01/27/21 1157  GLUCAP 139* 168* 162*    Acute on chronic diastolic (congestive) heart failure (HCC)-euvolemic, fluid status managed with dialysis  Acute on chronic respiratory failure with hypoxia and hypercapnia (HCC) -Required continued MV post-operatively.    OSA (obstructive sleep apnea) -BiPAP nightly   History of DVT (deep vein thrombosis) -continue anticoagulation   Impaired physical mobility -PT recommending SNF.   Scheduled Meds:  amiodarone  200 mg Oral BID   apixaban  5 mg Oral BID   atorvastatin  40 mg Oral QPM   calcium acetate  1,334 mg Oral TID WC   Chlorhexidine Gluconate Cloth  6 each Topical Q0600   Chlorhexidine Gluconate Cloth  6 each Topical Q0600   clopidogrel  75 mg Oral Daily   [START ON 01/31/2021] darbepoetin (ARANESP) injection - DIALYSIS  200 mcg Intravenous Q Mon-HD   Gerhardt's butt cream   Topical BID   insulin aspart  0-6 Units Subcutaneous Q4H   levothyroxine  50 mcg Oral Q0600   midodrine  10 mg Oral TID WC   mupirocin ointment   Nasal BID   polyethylene glycol  17 g Oral Daily   pregabalin  25 mg Oral BID   Continuous Infusions:  ceFEPime (MAXIPIME) IV Stopped (01/26/21 2216)   PRN Meds:.acetaminophen **OR** acetaminophen, diphenhydrAMINE, HYDROmorphone (DILAUDID) injection, oxyCODONE, sodium chloride flush  Diet Orders (From admission, onward)     Start     Ordered   01/26/21 1433  DIET SOFT Room service appropriate? Yes; Fluid consistency: Thin  Diet effective now       Question Answer Comment  Room service  appropriate? Yes   Fluid consistency: Thin      01/26/21 1432            DVT prophylaxis:  apixaban (ELIQUIS) tablet 5 mg     Code Status: Full Code  Family Communication: No family at bedside  Status is: Inpatient  Remains inpatient appropriate because:Inpatient level of care appropriate due to severity of illness  Dispo: The patient is  from: Home              Anticipated d/c is to: Home              Patient currently is not medically stable to d/c.   Difficult to place patient No   Level of care: Med-Surg  Consultants:  Orthopedic surgery  Procedures:  7/14 I&D  Microbiology  Wound cultures with Serratia  Antimicrobials: Currently on cefepime   Objective: Vitals:   01/27/21 0500 01/27/21 0629 01/27/21 0811 01/27/21 1153  BP:  112/60 102/66 114/61  Pulse:   (!) 108 (!) 106  Resp:   18 18  Temp:    98.2 F (36.8 C)  TempSrc:    Oral  SpO2:   95% 100%  Weight: 112.4 kg     Height:        Intake/Output Summary (Last 24 hours) at 01/27/2021 1334 Last data filed at 01/27/2021 0400 Gross per 24 hour  Intake 169.06 ml  Output --  Net 169.06 ml    Filed Weights   01/26/21 0840 01/26/21 1220 01/27/21 0500  Weight: 111.3 kg 109 kg 112.4 kg    Examination:  Constitutional: he is in no distress  Eyes: no icterus ENMT: mmm Neck: normal, supple Respiratory: Clear bilaterally, no wheezing, no crackles, normal respiratory effort Cardiovascular: Regular rate and rhythm, no murmurs, no edema Abdomen: Soft, NT, ND, bowel sounds positive Musculoskeletal: no clubbing / cyanosis.  Skin: No rashes Neurologic: non focal    Data Reviewed: I have independently reviewed following labs and imaging studies   CBC: Recent Labs  Lab 01/23/21 0243 01/24/21 0624 01/25/21 0050 01/26/21 0228 01/27/21 0949  WBC 14.1* 13.5* 9.4 12.8* 10.7*  NEUTROABS 11.8* 11.0* 8.3* 9.8* 8.7*  HGB 7.2* 7.3* 8.3* 8.1* 7.8*  HCT 23.7* 23.9* 27.4* 26.6* 26.0*  MCV 91.5 91.2 92.3 94.3 93.9  PLT 215 210 235 241 [id number]    Basic Metabolic Panel: Recent Labs  Lab 01/23/21 0243 01/24/21 0624 01/24/21 2054 01/25/21 0050 01/26/21 0228  NA 140 139 136 136 136  K 3.8 3.6 3.5 3.6 3.6  CL 100 101 97* 99 100  CO2 '28 25 27 26 24  '$ GLUCOSE 165* 146* 185* 167* 153*  BUN 24* 28* 16 16 26*  CREATININE 4.45* 5.51* 3.70* 3.83* 5.31*   CALCIUM 9.0 8.6* 8.5* 8.4* 8.4*  MG  --   --  1.8  --   --     Liver Function Tests: Recent Labs  Lab 01/21/21 0613 01/22/21 0028 01/23/21 0243 01/26/21 0228  AST '29 28 29 '$ 33  ALT '6 9 9 7  '$ ALKPHOS 104 84 95 138*  BILITOT 2.0* 1.6* 1.3* 0.9  PROT 5.7* 5.7* 6.5 6.1*  ALBUMIN 2.2* 2.3* 2.3* 2.1*    Coagulation Profile: No results for input(s): INR, PROTIME in the last 168 hours. HbA1C: No results for input(s): HGBA1C in the last 72 hours. CBG: Recent Labs  Lab 01/26/21 2033 01/27/21 0102 01/27/21 0406 01/27/21 0819 01/27/21 1157  GLUCAP 233* 181* 139* 168* 162*     Recent  Results (from the past 240 hour(s))  Blood culture (routine x 2)     Status: None   Collection Time: 01/17/21  9:38 PM   Specimen: BLOOD  Result Value Ref Range Status   Specimen Description BLOOD RIGHT ARM  Final   Special Requests   Final    BOTTLES DRAWN AEROBIC AND ANAEROBIC Blood Culture adequate volume   Culture   Final    NO GROWTH 5 DAYS Performed at [facility], [address] [place], [place] [address]    Report Status 01/22/2021 FINAL  Final  Blood culture (routine x 2)     Status: None   Collection Time: 01/17/21  9:40 PM   Specimen: BLOOD  Result Value Ref Range Status   Specimen Description BLOOD SITE NOT SPECIFIED  Final   Special Requests   Final    BOTTLES DRAWN AEROBIC AND ANAEROBIC Blood Culture adequate volume   Culture   Final    NO GROWTH 5 DAYS Performed at [facility], [address], [place], [place] [address]    Report Status 01/22/2021 FINAL  Final  Resp Panel by RT-PCR (Flu A&B, Covid) Nasopharyngeal Swab     Status: None   Collection Time: 01/17/21  9:50 PM   Specimen: Nasopharyngeal Swab; Nasopharyngeal(NP) swabs in vial transport medium  Result Value Ref Range Status   SARS Coronavirus 2 by RT PCR NEGATIVE NEGATIVE Final    Comment: (NOTE) SARS-CoV-2 target nucleic acids are NOT DETECTED.  The SARS-CoV-2 RNA is generally detectable in  upper respiratory specimens during the acute phase of infection. The lowest concentration of SARS-CoV-2 viral copies this assay can detect is 138 copies/mL. A negative result does not preclude SARS-Cov-2 infection and should not be used as the sole basis for treatment or other patient management decisions. A negative result may occur with  improper specimen collection/handling, submission of specimen other than nasopharyngeal swab, presence of viral mutation(s) within the areas targeted by this assay, and inadequate number of viral copies(<138 copies/mL). A negative result must be combined with clinical observations, patient history, and epidemiological information. The expected result is Negative.  Fact Sheet for Patients:  EntrepreneurPulse.com.au  Fact Sheet for Healthcare Providers:  IncredibleEmployment.be  This test is no t yet approved or cleared by the [place] FDA and  has been authorized for detection and/or diagnosis of SARS-CoV-2 by FDA under an Emergency Use Authorization (EUA). This EUA will remain  in effect (meaning this test can be used) for the duration of the COVID-19 declaration under Section 564(b)(1) of the Act, 21 U.S.C.section 360bbb-3(b)(1), unless the authorization is terminated  or revoked sooner.       Influenza A by PCR NEGATIVE NEGATIVE Final   Influenza B by PCR NEGATIVE NEGATIVE Final    Comment: (NOTE) The Xpert Xpress SARS-CoV-2/FLU/RSV plus assay is intended as an aid in the diagnosis of influenza from Nasopharyngeal swab specimens and should not be used as a sole basis for treatment. Nasal washings and aspirates are unacceptable for Xpert Xpress SARS-CoV-2/FLU/RSV testing.  Fact Sheet for Patients: EntrepreneurPulse.com.au  Fact Sheet for Healthcare Providers: IncredibleEmployment.be  This test is not yet approved or cleared by the [place] FDA and has been  authorized for detection and/or diagnosis of SARS-CoV-2 by FDA under an Emergency Use Authorization (EUA). This EUA will remain in effect (meaning this test can be used) for the duration of the COVID-19 declaration under Section 564(b)(1) of the Act, 21 U.S.C. section 360bbb-3(b)(1), unless the  authorization is terminated or revoked.  Performed at [facility], [place] [address] [place], [place] [address]   Aerobic/Anaerobic Culture w Gram Stain (surgical/deep wound)     Status: None   Collection Time: 01/20/21  5:10 PM   Specimen: Wound; Abscess  Result Value Ref Range Status   Specimen Description ABSCESS  Final   Special Requests RIGHT THIGH ABSCESS SPEC A  Final   Gram Stain   Final    FEW WBC PRESENT,BOTH PMN AND MONONUCLEAR FEW GRAM NEGATIVE RODS    Culture   Final    FEW SERRATIA MARCESCENS NO ANAEROBES ISOLATED Performed at [facility], [address] [place], [place] [address]    Report Status 01/25/2021 FINAL  Final   Organism ID, Bacteria SERRATIA MARCESCENS  Final      Susceptibility   Serratia marcescens - MIC*    CEFAZOLIN >=64 RESISTANT Resistant     CEFEPIME <=0.12 SENSITIVE Sensitive     CEFTAZIDIME <=1 SENSITIVE Sensitive     CEFTRIAXONE <=0.25 SENSITIVE Sensitive     CIPROFLOXACIN <=0.25 SENSITIVE Sensitive     GENTAMICIN <=1 SENSITIVE Sensitive     TRIMETH/SULFA <=20 SENSITIVE Sensitive     * FEW SERRATIA MARCESCENS  Surgical pcr screen     Status: Abnormal   Collection Time: 01/20/21  6:31 PM   Specimen: Nasal Mucosa; Nasal Swab  Result Value Ref Range Status   MRSA, PCR POSITIVE (A) NEGATIVE Final    Comment: RESULT CALLED TO, READ BACK BY AND VERIFIED WITH: SAVAGE RN '@2024'$  01/20/21 EB    Staphylococcus aureus POSITIVE (A) NEGATIVE Final    Comment: (NOTE) The Xpert SA Assay (FDA approved for NASAL specimens in patients 62 years of age and older), is one component of a comprehensive surveillance program. It is not intended to diagnose  infection nor to guide or monitor treatment. Performed at [facility], [place] [address], [place], [place] [address]   Resp Panel by RT-PCR (Flu A&B, Covid) Nasopharyngeal Swab     Status: None   Collection Time: 01/27/21 11:28 AM   Specimen: Nasopharyngeal Swab; Nasopharyngeal(NP) swabs in vial transport medium  Result Value Ref Range Status   SARS Coronavirus 2 by RT PCR NEGATIVE NEGATIVE Final    Comment: (NOTE) SARS-CoV-2 target nucleic acids are NOT DETECTED.  The SARS-CoV-2 RNA is generally detectable in upper respiratory specimens during the acute phase of infection. The lowest concentration of SARS-CoV-2 viral copies this assay can detect is 138 copies/mL. A negative result does not preclude SARS-Cov-2 infection and should not be used as the sole basis for treatment or other patient management decisions. A negative result may occur with  improper specimen collection/handling, submission of specimen other than nasopharyngeal swab, presence of viral mutation(s) within the areas targeted by this assay, and inadequate number of viral copies(<138 copies/mL). A negative result must be combined with clinical observations, patient history, and epidemiological information. The expected result is Negative.  Fact Sheet for Patients:  EntrepreneurPulse.com.au  Fact Sheet for Healthcare Providers:  IncredibleEmployment.be  This test is no t yet approved or cleared by the [place] FDA and  has been authorized for detection and/or diagnosis of SARS-CoV-2 by FDA under an Emergency Use Authorization (EUA). This EUA will remain  in effect (meaning this test can be used) for the duration of the COVID-19 declaration under Section 564(b)(1) of the Act, 21 U.S.C.section 360bbb-3(b)(1), unless the authorization is terminated  or revoked sooner.  Influenza A by PCR NEGATIVE NEGATIVE Final   Influenza B by PCR NEGATIVE NEGATIVE Final     Comment: (NOTE) The Xpert Xpress SARS-CoV-2/FLU/RSV plus assay is intended as an aid in the diagnosis of influenza from Nasopharyngeal swab specimens and should not be used as a sole basis for treatment. Nasal washings and aspirates are unacceptable for Xpert Xpress SARS-CoV-2/FLU/RSV testing.  Fact Sheet for Patients: EntrepreneurPulse.com.au  Fact Sheet for Healthcare Providers: IncredibleEmployment.be  This test is not yet approved or cleared by the [place] FDA and has been authorized for detection and/or diagnosis of SARS-CoV-2 by FDA under an Emergency Use Authorization (EUA). This EUA will remain in effect (meaning this test can be used) for the duration of the COVID-19 declaration under Section 564(b)(1) of the Act, 21 U.S.C. section 360bbb-3(b)(1), unless the authorization is terminated or revoked.  Performed at [facility], [place] [address], [place], [place] [address]       Radiology Studies: No results found.  Marzetta Board, MD, PhD [facility]  Between 7 am - 7 pm I am available, please contact me via Amion (for emergencies) or Securechat (non urgent messages)  Between 7 pm - 7 am I am not available, please contact night coverage MD/APP via Amion

## 2021-01-27 NOTE — Progress Notes (Signed)
Physical Therapy Treatment Patient Details Name: Kyle Walton MRN: [id number] DOB: 1956/01/07 Today's Date: 01/27/2021    History of Present Illness 65 year old male admitted with somnolence and confusion; R thigh infection w/ pyomyositis + bilat lower leg cellulitis - sp ID R thigh abscess with placement of wound vac 7/14. Post op Acute respiratory failure with hypoxemia and hypercapnia. PMH: ESRD on HD (MWF), DM2, multiple toe amputations, morbid obesity, chronic diastolic heart failure, paroxysmal fibrillation, chronic hypoxic respiratory failure and hypothyroidism.  He is a nursing home resident.    PT Comments    Worked with patient this morning with focus on R knee extension/flexion ROM and general knee mobility this morning. ROM is improving but still remains quite limited- perhaps 7-10 degrees passive knee extension and 30-40 degrees flexion today. Able to activate quad with quad press but needed heavy cues. Left in bed with all needs met, alarm active. Plan for EOB/OOB later today during PT/OT co-tx.     Follow Up Recommendations  SNF     Equipment Recommendations  3in1 (PT);Wheelchair (measurements PT);Wheelchair cushion (measurements PT);Hospital bed;Other (comment) (air overlay)    Recommendations for Other Services       Precautions / Restrictions Precautions Precautions: Fall Precaution Comments: contact precautions; Also noteworthy that lateral aspect of R thigh is very painful and sensitive to even light touch, legally blind Restrictions Weight Bearing Restrictions: No    Mobility  Bed Mobility               General bed mobility comments: deferred- focus on knee this morning    Transfers                 General transfer comment: deferred- focus on knee this morning  Ambulation/Gait             General Gait Details: unable   Stairs             Wheelchair Mobility    Modified Rankin (Stroke Patients Only)       Balance                                             Cognition Arousal/Alertness: Awake/alert Behavior During Therapy: WFL for tasks assessed/performed Overall Cognitive Status: Impaired/Different from baseline                     Current Attention Level: Sustained Memory: Decreased short-term memory Following Commands: Follows one step commands consistently;Follows one step commands with increased time Safety/Judgement: Decreased awareness of safety;Decreased awareness of deficits Awareness: Intellectual Problem Solving: Difficulty sequencing;Requires verbal cues General Comments: still confused but able to have functional conversation with me today- did not remember me from prior sessions, but did remember our plan to alternate working on knee ROM/general mobility      Exercises      General Comments General comments (skin integrity, edema, etc.): declind EOB this morning- will plan to attempt in PM with OT/PT co-tx      Pertinent Vitals/Pain Pain Assessment: Faces Faces Pain Scale: Hurts little more Pain Location: RLE knee to hip Pain Descriptors / Indicators: Discomfort;Sore Pain Intervention(s): Limited activity within patient's tolerance;Monitored during session    Home Living                      Prior Function  PT Goals (current goals can now be found in the care plan section) Acute Rehab PT Goals Patient Stated Goal: To be able to walk again PT Goal Formulation: With patient Time For Goal Achievement: 02/05/21 Potential to Achieve Goals: Fair Progress towards PT goals: Progressing toward goals    Frequency    Min 2X/week      PT Plan Current plan remains appropriate    Co-evaluation              AM-PAC PT "6 Clicks" Mobility   Outcome Measure  Help needed turning from your back to your side while in a flat bed without using bedrails?: Total Help needed moving from lying on your back to sitting on the side of a  flat bed without using bedrails?: Total Help needed moving to and from a bed to a chair (including a wheelchair)?: Total Help needed standing up from a chair using your arms (e.g., wheelchair or bedside chair)?: Total Help needed to walk in hospital room?: Total Help needed climbing 3-5 steps with a railing? : Total 6 Click Score: 6    End of Session   Activity Tolerance: Patient tolerated treatment well Patient left: in bed;with call bell/phone within reach;with bed alarm set Nurse Communication: Mobility status PT Visit Diagnosis: Muscle weakness (generalized) (M62.81);Difficulty in walking, not elsewhere classified (R26.2);Pain Pain - Right/Left: Right Pain - part of body: Leg     Time: 1012-1022 PT Time Calculation (min) (ACUTE ONLY): 10 min  Charges:  $Therapeutic Exercise: 8-22 mins                    Windell Norfolk, DPT, PN1   Supplemental Physical Therapist [place]    Pager [phone] Acute Rehab Office [phone]

## 2021-01-27 NOTE — Progress Notes (Addendum)
[facility] Progress Note   Subjective:  Seen in room. No CP or dyspnea. C/o hiccups x 3 days. Wound vac has been removed. On exam, noted to have much worsened edema - will try for much higher goal with next HD.  Objective Vitals:   01/27/21 0431 01/27/21 0500 01/27/21 0629 01/27/21 0811  BP: 92/63  112/60 102/66  Pulse: 95   (!) 108  Resp: 18   18  Temp: 98.2 F (36.8 C)     TempSrc: Oral     SpO2: 96%   95%  Weight:  112.4 kg    Height:       Physical Exam General: Chronically ill appearing man, NAD. Heart: RRR; no murmur Lungs: CTAB; no rales Abdomen: soft Extremities: 2+ BLE edema (R>L) which extends up to hip/flank area Dialysis Access: [facility]  Additional Objective Labs: Basic Metabolic Panel: Recent Labs  Lab 01/24/21 2054 01/25/21 0050 01/26/21 0228  NA 136 136 136  K 3.5 3.6 3.6  CL 97* 99 100  CO2 '27 26 24  '$ GLUCOSE 185* 167* 153*  BUN 16 16 26*  CREATININE 3.70* 3.83* 5.31*  CALCIUM 8.5* 8.4* 8.4*   Liver Function Tests: Recent Labs  Lab 01/22/21 0028 01/23/21 0243 01/26/21 0228  AST 28 29 33  ALT '9 9 7  '$ ALKPHOS 84 95 138*  BILITOT 1.6* 1.3* 0.9  PROT 5.7* 6.5 6.1*  ALBUMIN 2.3* 2.3* 2.1*   CBC: Recent Labs  Lab 01/23/21 0243 01/24/21 0624 01/25/21 0050 01/26/21 0228 01/27/21 0949  WBC 14.1* 13.5* 9.4 12.8* 10.7*  NEUTROABS 11.8* 11.0* 8.3* 9.8* PENDING  HGB 7.2* 7.3* 8.3* 8.1* 7.8*  HCT 23.7* 23.9* 27.4* 26.6* 26.0*  MCV 91.5 91.2 92.3 94.3 93.9  PLT 215 210 235 241 226   Blood Culture    Component Value Date/Time   SDES ABSCESS 01/20/2021 1710   SPECREQUEST RIGHT THIGH ABSCESS SPEC A 01/20/2021 1710   CULT  01/20/2021 1710    FEW SERRATIA MARCESCENS NO ANAEROBES ISOLATED Performed at [facility] [address], [place], [place] [address]    REPTSTATUS 01/25/2021 FINAL 01/20/2021 1710   Medications:  ceFEPime (MAXIPIME) IV Stopped (01/26/21 2216)    amiodarone  200 mg Oral BID   apixaban  5 mg Oral  BID   atorvastatin  40 mg Oral QPM   calcium acetate  1,334 mg Oral TID WC   Chlorhexidine Gluconate Cloth  6 each Topical Q0600   Chlorhexidine Gluconate Cloth  6 each Topical Q0600   clopidogrel  75 mg Oral Daily   [START ON 01/28/2021] darbepoetin (ARANESP) injection - DIALYSIS  100 mcg Intravenous Q Fri-HD   Gerhardt's butt cream   Topical BID   insulin aspart  0-6 Units Subcutaneous Q4H   levothyroxine  50 mcg Oral Q0600   midodrine  10 mg Oral TID WC   mupirocin ointment   Nasal BID   polyethylene glycol  17 g Oral Daily   pregabalin  25 mg Oral BID    Dialysis Orders: MWF at [facility] 4hr, 450/800, EDW 117kg, 3K/2.5Ca, TDC, heparin 4900 bolus + 500/hr   Assessment/Plan: Right thigh abscess/pyomyositis: S/p I&D + wound vac 7/14 by ortho. Wound Cx now + for Serratia -> abx changed to Cefepime. AMS: On admit, resolved/improved ESRD: Continue HD per usual MWF schedule -> next HD 7/22. Chronic hypotension - on midodrine TID Volume overload: Had been resolving, now filling back up with edema today. He remains well under  prior dry weight -> need to push him down further. 4L UFG with next HD planned. Anemia of ESRD: Hgb 7.8,  continue Aranesp q Mon, but ^ing next dose. 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 PAF - per cardiology to have cardioversion later this month Diastolic CHF - UF as tolerated as above.   Veneta Penton, PA-C 01/27/2021, 11:09 AM  [facility]  I have seen and examined this patient and agree with plan and assessment in the above note with renal recommendations/intervention highlighted.  Will likely benefit from blood transfusion with HD tomorrow to help facilitate great UF goal +/- IV albumin. Broadus John A Warda Mcqueary,MD 01/27/2021 11:36 AM

## 2021-01-27 NOTE — TOC Progression Note (Signed)
Transition of Care [facility]) - Progression Note    Patient Details  Name: Kyle Walton MRN: [id number] Date of Birth: [birth year]  Transition of Care [facility]) CM/SW [place], [place] Phone Number: 01/27/2021, 1:19 PM  Clinical Narrative:    CSW spoke  with Juliann Pulse from [employer].  [place] is working on [employer] fast streamer and it should be back on Friday.  [facility] can take pt back if insurance Josem Kaufmann has been received.  Covid has been ordered.  TOC will continue to  assist with disposition planning.   Expected Discharge Plan: [place] Barriers to Discharge: Continued Medical Work up  Expected Discharge Plan and Services Expected Discharge Plan: [place] In-house Referral: Clinical Social Work   Post Acute Care Choice: [place] Living arrangements for the past 2 months: [place]                                       Social Determinants of Health (SDOH) Interventions    Readmission Risk Interventions No flowsheet data found.

## 2021-01-27 NOTE — Progress Notes (Addendum)
Physical Therapy Treatment Patient Details Name: Kyle Walton MRN: [id number] DOB: 1956/03/02 Today's Date: 01/27/2021    History of Present Illness 65 year old male admitted with somnolence and confusion; R thigh infection w/ pyomyositis + bilat lower leg cellulitis - sp ID R thigh abscess with placement of wound vac 7/14. Post op Acute respiratory failure with hypoxemia and hypercapnia. PMH: ESRD on HD (MWF), DM2, multiple toe amputations, morbid obesity, chronic diastolic heart failure, paroxysmal fibrillation, chronic hypoxic respiratory failure and hypothyroidism.  He is a nursing home resident.    PT Comments    Patient received in bed sleeping, easily woken and agreeable to working with therapies. Needed to roll side to side multiple times for pericare, then was able to get to EOB with ModAx2. Participated well in dynamic balance activities at EOB but did require MinA for balance. Able to come to partial (about 50% of the way up) standing but too weak to fully extend hips today. After first standing attempt, we noticed that his R thigh wound had begun to bleed, so returned him to bed and immediately notified nursing. Left in bed with all needs met and nursing attending. Will continue to follow.     Follow Up Recommendations  SNF     Equipment Recommendations  3in1 (PT);Wheelchair (measurements PT);Wheelchair cushion (measurements PT);Hospital bed;Other (comment) (air overlay)    Recommendations for Other Services       Precautions / Restrictions Precautions Precautions: Fall Precaution Comments: contact precautions; Also noteworthy that lateral aspect of R thigh is very painful and sensitive to even light touch, legally blind Restrictions Weight Bearing Restrictions: No    Mobility  Bed Mobility Overal bed mobility: Needs Assistance Bed Mobility: Rolling;Supine to Sit;Sit to Supine Rolling: Mod assist   Supine to sit: Mod assist;+2 for physical assistance;HOB elevated Sit  to supine: Kyle assist;+2 for physical assistance   General bed mobility comments: better initiation and ability to sequence to EOB, still heavy assist to shift weight and bring trunk up to midline    Transfers Overall transfer level: Needs assistance Equipment used: Rolling walker (2 wheeled) Transfers: Sit to/from Stand Sit to Stand: Total assist;+2 physical assistance;From elevated surface         General transfer comment: totalAx2 and elevated bed to boost to just a partial stand- but able to clear buttocks from bed, just too weak to fully extend hips just yet.  Ambulation/Gait             General Gait Details: unable   Stairs             Wheelchair Mobility    Modified Rankin (Stroke Patients Only)       Balance Overall balance assessment: Needs assistance Sitting-balance support: Feet supported Sitting balance-Leahy Scale: Fair Sitting balance - Comments: MinA for dynamic sitting balance Postural control: Posterior lean Standing balance support: Bilateral upper extremity supported;During functional activity Standing balance-Leahy Scale: Zero Standing balance comment: unable today                            Cognition Arousal/Alertness: Awake/alert Behavior During Therapy: WFL for tasks assessed/performed Overall Cognitive Status: Impaired/Different from baseline Area of Impairment: Attention;Memory;Following commands;Safety/judgement;Awareness;Problem solving                   Current Attention Level: Sustained Memory: Decreased short-term memory Following Commands: Follows one step commands consistently;Follows one step commands with increased time Safety/Judgement: Decreased awareness of safety;Decreased awareness of deficits  Awareness: Intellectual Problem Solving: Difficulty sequencing;Requires verbal cues General Comments: still confused but able to have functional conversation with me today- did not remember me from prior  sessions, but did remember our plan to alternate working on knee ROM/general mobility, as well as plan for EOB/OOB this afternoon      Exercises      General Comments        Pertinent Vitals/Pain Pain Assessment: Faces Faces Pain Scale: Hurts little more Pain Location: RLE knee to hip Pain Descriptors / Indicators: Discomfort;Sore Pain Intervention(s): Limited activity within patient's tolerance;Monitored during session    Home Living                      Prior Function            PT Goals (current goals can now be found in the care plan section) Acute Rehab PT Goals Patient Stated Goal: To be able to walk again PT Goal Formulation: With patient Time For Goal Achievement: 02/05/21 Potential to Achieve Goals: Fair Progress towards PT goals: Progressing toward goals    Frequency    Min 2X/week      PT Plan Current plan remains appropriate    Co-evaluation              AM-PAC PT "6 Clicks" Mobility   Outcome Measure  Help needed turning from your back to your side while in a flat bed without using bedrails?: A Lot Help needed moving from lying on your back to sitting on the side of a flat bed without using bedrails?: Total Help needed moving to and from a bed to a chair (including a wheelchair)?: Total Help needed standing up from a chair using your arms (e.g., wheelchair or bedside chair)?: Total Help needed to walk in hospital room?: Total Help needed climbing 3-5 steps with a railing? : Total 6 Click Score: 7    End of Session   Activity Tolerance: Patient tolerated treatment well Patient left: in bed;with call bell/phone within reach Nurse Communication: Mobility status PT Visit Diagnosis: Muscle weakness (generalized) (M62.81);Difficulty in walking, not elsewhere classified (R26.2);Pain Pain - Right/Left: Right Pain - part of body: Leg     Time: 2683-4196 PT Time Calculation (min) (ACUTE ONLY): 31 min  Charges:  $Therapeutic  Activity: 8-22 mins (co-tx with OT)                    Windell Norfolk, DPT, PN1   Supplemental Physical Therapist [place]    Pager [phone] Acute Rehab Office [phone]

## 2021-01-28 DIAGNOSIS — J9621 Acute and chronic respiratory failure with hypoxia: Secondary | ICD-10-CM | POA: Diagnosis not present

## 2021-01-28 DIAGNOSIS — E039 Hypothyroidism, unspecified: Secondary | ICD-10-CM | POA: Diagnosis not present

## 2021-01-28 DIAGNOSIS — G9340 Encephalopathy, unspecified: Secondary | ICD-10-CM | POA: Diagnosis not present

## 2021-01-28 DIAGNOSIS — I5033 Acute on chronic diastolic (congestive) heart failure: Secondary | ICD-10-CM | POA: Diagnosis not present

## 2021-01-28 LAB — GLUCOSE, CAPILLARY
Glucose-Capillary: 155 mg/dL — ABNORMAL HIGH (ref 70–99)
Glucose-Capillary: 174 mg/dL — ABNORMAL HIGH (ref 70–99)
Glucose-Capillary: 195 mg/dL — ABNORMAL HIGH (ref 70–99)
Glucose-Capillary: 207 mg/dL — ABNORMAL HIGH (ref 70–99)
Glucose-Capillary: 219 mg/dL — ABNORMAL HIGH (ref 70–99)
Glucose-Capillary: 236 mg/dL — ABNORMAL HIGH (ref 70–99)

## 2021-01-28 LAB — CBC WITH DIFFERENTIAL/PLATELET
Abs Immature Granulocytes: 0.13 10*3/uL — ABNORMAL HIGH (ref 0.00–0.07)
Basophils Absolute: 0 10*3/uL (ref 0.0–0.1)
Basophils Relative: 0 %
Eosinophils Absolute: 0.2 10*3/uL (ref 0.0–0.5)
Eosinophils Relative: 2 %
HCT: 25.4 % — ABNORMAL LOW (ref 39.0–52.0)
Hemoglobin: 7.6 g/dL — ABNORMAL LOW (ref 13.0–17.0)
Immature Granulocytes: 1 %
Lymphocytes Relative: 11 %
Lymphs Abs: 1.3 10*3/uL (ref 0.7–4.0)
MCH: 28 pg (ref 26.0–34.0)
MCHC: 29.9 g/dL — ABNORMAL LOW (ref 30.0–36.0)
MCV: 93.7 fL (ref 80.0–100.0)
Monocytes Absolute: 1.1 10*3/uL — ABNORMAL HIGH (ref 0.1–1.0)
Monocytes Relative: 9 %
Neutro Abs: 9.3 10*3/uL — ABNORMAL HIGH (ref 1.7–7.7)
Neutrophils Relative %: 77 %
Platelets: 248 10*3/uL (ref 150–400)
RBC: 2.71 MIL/uL — ABNORMAL LOW (ref 4.22–5.81)
RDW: 19.3 % — ABNORMAL HIGH (ref 11.5–15.5)
WBC: 12 10*3/uL — ABNORMAL HIGH (ref 4.0–10.5)
nRBC: 0.3 % — ABNORMAL HIGH (ref 0.0–0.2)

## 2021-01-28 MED ORDER — AMIODARONE HCL 200 MG PO TABS: 200.0000 mg | ORAL_TABLET | Freq: Two times a day (BID) | ORAL | Status: AC

## 2021-01-28 MED ORDER — HEPARIN SODIUM (PORCINE) 1000 UNIT/ML IJ SOLN
INTRAMUSCULAR | Status: AC
  Administered 2021-01-28: 4200 [IU]
  Filled 2021-01-28: qty 5

## 2021-01-28 MED ORDER — MIDODRINE HCL 10 MG PO TABS: 10.0000 mg | ORAL_TABLET | Freq: Three times a day (TID) | ORAL | 0 refills | Status: AC

## 2021-01-28 MED ORDER — OXYCODONE HCL 10 MG PO TABS: 10.0000 mg | ORAL_TABLET | Freq: Two times a day (BID) | ORAL | 0 refills | Status: DC | PRN

## 2021-01-28 MED ORDER — CIPROFLOXACIN HCL 500 MG PO TABS: 500.0000 mg | ORAL_TABLET | Freq: Every day | ORAL | 0 refills | Status: AC

## 2021-01-28 MED ORDER — CLONAZEPAM 0.5 MG PO TABS: 0.2500 mg | ORAL_TABLET | Freq: Two times a day (BID) | ORAL | 0 refills | Status: DC | PRN

## 2021-01-28 MED ORDER — CLONAZEPAM 0.5 MG PO TABS: 0.2500 mg | ORAL_TABLET | Freq: Two times a day (BID) | ORAL | 0 refills | Status: AC | PRN

## 2021-01-28 NOTE — TOC Progression Note (Addendum)
Transition of Care [facility]) - Progression Note    Patient Details  Name: Kyle Walton MRN: [id number] Date of Birth: 28-Sep-1955  Transition of Care [facility]) CM/SW [place], LCSW Phone Number: 01/28/2021, 9:15 AM  Clinical Narrative:    9:15am-[facility] checking on the status of insurance authorization. If received, can plan on discharging patient today after dialysis. CSW left vm for patient's spouse.   12:40pm-[facility] has received insurance approval. CSW will arrange PTAR after patient returns from dialysis. CSW spoke with patient's spouse. She reported appreciation for the call and is in agreement with plan.    Expected Discharge Plan: [place] Barriers to Discharge: Continued Medical Work up  Expected Discharge Plan and Services Expected Discharge Plan: [place] In-house Referral: Clinical Social Work   Post Acute Care Choice: [place] Living arrangements for the past 2 months: [place]                                       Social Determinants of Health (SDOH) Interventions    Readmission Risk Interventions No flowsheet data found.

## 2021-01-28 NOTE — Progress Notes (Signed)
Nurse arrived for line care assessment. Gauze drsg in place at this time. Place 7/19. Drsg needs to be changed today. Patient requested nurse to return d/t him eating dinner at this time. Fran Lowes, RN VAST

## 2021-01-28 NOTE — Progress Notes (Addendum)
[facility] Progress Note   Subjective:  Seen in room. No CP or dyspnea. For HD later today. Says leg pain is a little better today, feeling the best he has in few weeks.  Objective Vitals:   01/28/21 0000 01/28/21 0405 01/28/21 0756 01/28/21 0820  BP: (!) 94/54 (!) 108/59 96/72 109/70  Pulse: 97 (!) 101 (!) 109 (!) 107  Resp: '17 17 18 18  '$ Temp: 98.5 F (36.9 C) (!) 97.3 F (36.3 C) 97.6 F (36.4 C) 97.6 F (36.4 C)  TempSrc: Oral Oral Oral Oral  SpO2: 99% (!) 72% 99%   Weight:      Height:       Physical Exam General: Chronically ill appearing man, NAD. Heart: RRR; no murmur Lungs: CTAB; no rales Abdomen: soft Extremities: 2+ BLE edema (R>L) which extends up to hip/flank area  Additional Objective Labs: Basic Metabolic Panel: Recent Labs  Lab 01/24/21 2054 01/25/21 0050 01/26/21 0228  NA 136 136 136  K 3.5 3.6 3.6  CL 97* 99 100  CO2 '27 26 24  '$ GLUCOSE 185* 167* 153*  BUN 16 16 26*  CREATININE 3.70* 3.83* 5.31*  CALCIUM 8.5* 8.4* 8.4*   Liver Function Tests: Recent Labs  Lab 01/22/21 0028 01/23/21 0243 01/26/21 0228  AST 28 29 33  ALT '9 9 7  '$ ALKPHOS 84 95 138*  BILITOT 1.6* 1.3* 0.9  PROT 5.7* 6.5 6.1*  ALBUMIN 2.3* 2.3* 2.1*   CBC: Recent Labs  Lab 01/24/21 0624 01/25/21 0050 01/26/21 0228 01/27/21 0949 01/28/21 0157  WBC 13.5* 9.4 12.8* 10.7* 12.0*  NEUTROABS 11.0* 8.3* 9.8* 8.7* 9.3*  HGB 7.3* 8.3* 8.1* 7.8* 7.6*  HCT 23.9* 27.4* 26.6* 26.0* 25.4*  MCV 91.2 92.3 94.3 93.9 93.7  PLT 210 235 241 226 248   Medications:  ceFEPime (MAXIPIME) IV 1 g (01/27/21 2302)    amiodarone  200 mg Oral BID   apixaban  5 mg Oral BID   atorvastatin  40 mg Oral QPM   calcium acetate  1,334 mg Oral TID WC   Chlorhexidine Gluconate Cloth  6 each Topical Q0600   Chlorhexidine Gluconate Cloth  6 each Topical Q0600   clopidogrel  75 mg Oral Daily   [START ON 01/31/2021] darbepoetin (ARANESP) injection - DIALYSIS  200 mcg Intravenous Q  Mon-HD   Gerhardt's butt cream   Topical BID   insulin aspart  0-6 Units Subcutaneous Q4H   levothyroxine  50 mcg Oral Q0600   midodrine  10 mg Oral TID WC   mupirocin ointment   Nasal BID   polyethylene glycol  17 g Oral Daily   pregabalin  25 mg Oral BID    Dialysis Orders: MWF at [facility] 4hr, 450/800, EDW 117kg, 3K/2.5Ca, TDC, heparin 4900 bolus + 500/hr   Assessment/Plan: Right thigh abscess/pyomyositis: S/p I&D + wound vac 7/14 by ortho. Wound Cx now + for Serratia -> abx changed to Cefepime. AMS: On admit, resolved/improved ESRD: Continue HD per usual MWF schedule -> next HD 7/22. Chronic hypotension - on midodrine TID Volume overload: Had been resolving, now filling back up with edema today. He remains well under prior dry weight -> need to push him down further. 4L UFG with next HD planned. Anemia of ESRD: Hgb 7.6,  continue Aranesp q Mon, but ^ing next dose. 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 PAF - per cardiology to have cardioversion later this month Diastolic CHF - UF as tolerated as above.  Veneta Penton, PA-C 01/28/2021, 11:33 AM  [facility]  I have seen and examined this patient and agree with plan and assessment in the above note with renal recommendations/intervention highlighted. UF as blood pressure tolerates.  Continue with midodrine tid. Will also add IV albumin with HD today.  Governor [place]ooks Kriya Westra,MD 01/28/2021 2:17 PM

## 2021-01-28 NOTE — Plan of Care (Signed)

## 2021-01-28 NOTE — Progress Notes (Signed)
Called report to [place], LPN at [facility].

## 2021-01-28 NOTE — Discharge Instructions (Signed)

## 2021-01-28 NOTE — Discharge Summary (Signed)
Physician Discharge Summary  Kyle Walton [id number] DOB: [birth year] DOA: 01/17/2021  PCP: Patient, No Pcp Per (Inactive)  Admit date: 01/17/2021 Discharge date: 01/28/2021  Admitted From: home Disposition:  SNF  Recommendations for Outpatient Follow-up:  Follow up with PCP in 1-2 weeks Continue ciprofloxacin for 7 more days  Home Health: none Equipment/Devices: none  Discharge Condition: stable CODE STATUS: Full code Diet recommendation: renal   HPI: Per admitting MD, Kyle Walton is a 65 y.o. male with medical history significant for end-stage renal disease on hemodialysis on Monday, Wednesday, Friday schedule, anemia of chronic kidney disease with baseline hemoglobin 9-11, type 2 diabetes mellitus, chronic diastolic heart failure, paroxysmal atrial fibrillation chronically anticoagulated on Eliquis, chronic hypoxic hypercapnic respiratory failure on 2 L continuous nasal cannula as well as nocturnal BiPAP, acquired hypothyroidism, who is admitted to [facility] on 01/17/2021 with acute encephalopathy after presenting from SNF to [facility] ED for evaluation of increased somnolence.   Hospital Course / Discharge diagnoses: Principal Problem Sepsis due to right leg cellulitis, right intramuscular thigh abscess-orthopedic surgery was consulted and patient was taken to the OR on 7/14 by Dr. Stann Mainland and he is status post excisional debridement and irrigation for right intramuscular thigh abscess. He had a wound VAC placed as well as a drain which were removed by Dr. Stann Mainland 7/20. Microbiology from the abscess showed Serratia marcescens this resistant to cefazolin but otherwise sensitive.  Discussed with infectious disease, recommending no more than 14 days treatment postoperatively, he has completed 7 days with 7 additional days remaining.  On discharge he will be transitioned to ciprofloxacin   Active Problems ESRD on HD-nephrology consulted, HD per schedule. PAF-resumed Eliquis,  continue amiodarone Hypothyroidism-continue Synthroid Hypotension-stable, continue chronic midodrine Anemia of renal disease-stable, no bleeding Acute metabolic encephalopathy-due to sepsis, severe illness.  Now resolved DM2, peripheral vascular disease Chronic pain-he appears to be on fentanyl, Dilaudid, oxycodone.  Will narrow to oxycodone alone given somnolence on admission Acute on chronic diastolic (congestive) heart failure (HCC)-euvolemic, fluid status managed with dialysis Acute on chronic respiratory failure with hypoxia and hypercapnia (HCC) -at baseline  OSA (obstructive sleep apnea History of DVT (deep vein thrombosis) -continue anticoagulation  Discharge Instructions   Allergies as of 01/28/2021       Reactions   Morphine Other (See Comments)   Per son, [name], pt becomes unresponsive/disoriented. Especially when given after HD tx   Liraglutide Diarrhea   Phenol Phenol        Medication List     STOP taking these medications    fentaNYL 25 MCG/HR Commonly known as: DURAGESIC   HYDROmorphone 2 MG tablet Commonly known as: DILAUDID       TAKE these medications    amiodarone 200 MG tablet Commonly known as: PACERONE Take 1 tablet (200 mg total) by mouth 2 (two) times daily. What changed: Another medication with the same name was removed. Continue taking this medication, and follow the directions you see here.   apixaban 5 MG Tabs tablet Commonly known as: ELIQUIS Take 1 tablet (5 mg total) by mouth 2 (two) times daily. What changed: additional instructions   atorvastatin 40 MG tablet Commonly known as: LIPITOR Take 40 mg by mouth every evening. (2100)   Basaglar KwikPen 100 UNIT/ML Inject 15 Units into the skin at bedtime.   calcium acetate 667 MG capsule Commonly known as: PHOSLO Take 1,334 mg by mouth 3 (three) times daily with meals.   cholestyramine light 4 g packet Commonly known as: PREVALITE  Take 1 packet (4 g total) by mouth every 12  (twelve) hours. What changed: additional instructions   ciprofloxacin 500 MG tablet Commonly known as: CIPRO Take 1 tablet (500 mg total) by mouth daily with breakfast for 7 days.   clonazePAM 0.5 MG tablet Commonly known as: KLONOPIN Take 0.5 tablets (0.25 mg total) by mouth 2 (two) times daily as needed for anxiety. What changed: Another medication with the same name was removed. Continue taking this medication, and follow the directions you see here.   clopidogrel 75 MG tablet Commonly known as: PLAVIX Take 75 mg by mouth in the morning.   Digoxin 62.5 MCG Tabs Take 0.0625 mg by mouth every other day.   feeding supplement (PRO-STAT SUGAR FREE 64) Liqd Take 30 mLs by mouth in the morning and at bedtime. (0900 & 2100)   ferric citrate 1 GM 210 MG(Fe) tablet Commonly known as: AURYXIA Take 210 mg by mouth 3 (three) times daily with meals. (0800, 1200 & 1700)   insulin lispro 100 UNIT/ML KwikPen Commonly known as: HUMALOG Inject 2-12 Units into the skin See admin instructions. 180-200 = 2 units,  201-250 = 5 units  251-300 = 7 units  301-400 = 12 units   latanoprost 0.005 % ophthalmic solution Commonly known as: XALATAN Place 1 drop into both eyes at bedtime.   levothyroxine 25 MCG tablet Commonly known as: SYNTHROID Take 25 mcg by mouth daily before breakfast.   lidocaine 5 % ointment Commonly known as: XYLOCAINE Apply topically 3 (three) times daily. What changed:  how much to take additional instructions   midodrine 10 MG tablet Commonly known as: PROAMATINE Take 1 tablet (10 mg total) by mouth 3 (three) times daily with meals. (0900, 1400 & 2100)   multivitamin tablet Take 1 tablet by mouth daily.   NEPHRO-VITE PO Take 1 tablet by mouth at bedtime.   multivitamin Tabs tablet Take 1 tablet by mouth at bedtime.   mupirocin ointment 2 % Commonly known as: BACTROBAN Apply 1 application topically in the morning and at bedtime.   nitroGLYCERIN 0.4 MG SL  tablet Commonly known as: NITROSTAT Place 0.4 mg under the tongue every 5 (five) minutes x 3 doses as needed for chest pain.   Oxycodone HCl 10 MG Tabs Take 1 tablet (10 mg total) by mouth 2 (two) times daily as needed (severe pain). What changed: Another medication with the same name was removed. Continue taking this medication, and follow the directions you see here.   OXYGEN Inhale 2 L into the lungs in the morning and at bedtime.   pantoprazole 40 MG tablet Commonly known as: PROTONIX Take 40 mg by mouth daily at 6 (six) AM. (0600)   pregabalin 25 MG capsule Commonly known as: LYRICA Take 25 mg by mouth 2 (two) times daily.   sevelamer carbonate 800 MG tablet Commonly known as: RENVELA Take 2 tablets (1,600 mg total) by mouth 3 (three) times daily with meals. What changed: additional instructions       ASK your doctor about these medications    docusate sodium 100 MG capsule Commonly known as: COLACE Take 1 capsule (100 mg total) by mouth 2 (two) times daily as needed for mild constipation.   metoprolol succinate 25 MG 24 hr tablet Commonly known as: TOPROL-XL Take 0.5 tablets (12.5 mg total) by mouth daily.   polyethylene glycol 17 g packet Commonly known as: MIRALAX / GLYCOLAX Take 17 g by mouth daily as needed for moderate constipation.  Follow-up Information     [facility] Follow up.   Specialty: Family Medicine Contact information: [address] [place] [phone]                Consultations: Orthopedic surgery  Procedures/Studies: I&D 7/14  DG Chest 2 View  Result Date: 01/17/2021 CLINICAL DATA:  Right leg pain and edema, history of missed dialysis session EXAM: CHEST - 2 VIEW COMPARISON:  12/30/2020 FINDINGS: Cardiac shadow is enlarged. Postsurgical changes are again seen. Defibrillator is noted in place. Left jugular dialysis catheter is again noted and stable. The  overall inspiratory effort is poor with crowding of the vascular markings. Some underlying vascular congestion is seen consistent with the known history of missed dialysis session. No focal infiltrate is noted. No bony abnormality is seen. IMPRESSION: Central vascular congestion related to volume overload. No other focal abnormality is noted. Electronically Signed   By: Inez Catalina M.D.   On: 01/17/2021 21:40   DG Abd 1 View  Result Date: 01/20/2021 CLINICAL DATA:  Check gastric catheter placement EXAM: ABDOMEN - 1 VIEW COMPARISON:  None. FINDINGS: Gastric catheter is noted deep within the stomach. No obstructive changes are seen. No bony abnormality is noted. IMPRESSION: Gastric catheter in the distal stomach. Electronically Signed   By: Inez Catalina M.D.   On: 01/20/2021 23:49   CT FEMUR RIGHT WO CONTRAST  Result Date: 01/20/2021 CLINICAL DATA:  Right thigh swelling, mass. Downtrending hemoglobin. EXAM: CT OF THE LOWER RIGHT EXTREMITY WITHOUT CONTRAST TECHNIQUE: Multidetector CT imaging of the right lower extremity was performed according to the standard protocol. COMPARISON:  Right tibia-fibula CT 01/19/2021 FINDINGS: Bones/Joint/Cartilage Right femur intact without fracture or dislocation. Mild diffuse right hip joint space narrowing. No appreciable right hip joint effusion. Mild medial compartment joint space narrowing of the right knee with small knee joint effusion. No bony erosion or periosteal elevation. Ligaments Suboptimally assessed by CT. Muscles and Tendons Large mixed density collection predominantly located within the right vastus lateralis muscle measuring approximately 29 x 7 x 10 cm (series 10, image 73; series 5, image 91). There are multiple foci of air within the collection. Collection extends from the level of the proximal femoral diaphysis to the level of the distal femoral metaphysis. No additional intramuscular collections. No acute tendinous abnormality is evident. Soft tissues  Extensive circumferential subcutaneous edema throughout the right thigh with associated skin thickening. No foci of gas within the soft tissues at outside of the previously described collection. Mildly prominent right inguinal lymph nodes are likely reactive. IMPRESSION: 1. Large mixed density collection predominantly located within the right vastus lateralis muscle measuring approximately 29 x 7 x 10 cm with multiple foci of air within the collection. Findings are concerning for hematoma with superimposed infection. 2. Extensive circumferential subcutaneous edema throughout the right thigh with associated skin thickening, suggesting cellulitis. No foci of gas within the soft tissues at outside of the previously described collection. 3. No acute osseous abnormality of the right femur. These results will be called to the ordering clinician or representative by the Radiologist Assistant, and communication documented in the PACS or [employer]. Electronically Signed   By: Davina Poke D.O.   On: 01/20/2021 12:16   CT TIBIA FIBULA RIGHT WO CONTRAST  Result Date: 01/19/2021 CLINICAL DATA:  Right lower leg cellulitis. EXAM: CT OF THE LOWER RIGHT EXTREMITY WITHOUT CONTRAST TECHNIQUE: Multidetector CT imaging of the right lower extremity was performed according to the  standard protocol. COMPARISON:  Right tibia and fibula x-rays dated Dec 03, 2020. FINDINGS: Bones/Joint/Cartilage No bony destruction or periosteal reaction. No fracture or dislocation. Mild knee medial compartment joint space in. Small knee joint effusion. Ligaments Ligaments are suboptimally evaluated by CT. Muscles and Tendons Grossly intact. Soft tissue Severe circumferential soft tissue swelling of the idstal thigh and lower leg. Incompletely visualized superficial fluid collection overlying the distal vastus lateralis muscle measuring 4.4 x 2.9 cm (series 4, image 1). No subcutaneous emphysema. Extensive atherosclerotic vascular  calcification. IMPRESSION: 1. Severe circumferential soft tissue swelling of the right distal thigh and lower leg, consistent with history of cellulitis. Incompletely visualized superficial fluid collection overlying the distal vastus lateralis muscle measuring 4.4 x 2.9 cm, concerning for abscess. 2. No acute osseous abnormality. Electronically Signed   By: Titus Dubin M.D.   On: 01/19/2021 08:12   CT FOOT LEFT WO CONTRAST  Result Date: 01/19/2021 CLINICAL DATA:  Dry gangrene of the left second metatarsal. EXAM: CT OF THE LEFT FOOT WITHOUT CONTRAST TECHNIQUE: Multidetector CT imaging of the left foot was performed according to the standard protocol. Multiplanar CT image reconstructions were also generated. COMPARISON:  Left foot x-rays dated May 28, 2020. FINDINGS: Bones/Joint/Cartilage Prior first and second toe amputations. No bony destruction or periosteal reaction. No fracture or dislocation. Mild midfoot osteoarthritis. No joint effusion. Ligaments Ligaments are suboptimally evaluated by CT. Muscles and Tendons Grossly intact. Soft tissue Mild diffuse soft tissue swelling. No subcutaneous emphysema. No fluid collection or hematoma. No soft tissue mass. IMPRESSION: 1. Prior first and second toe amputations. No acute osseous abnormality. 2. Mild diffuse soft tissue swelling. No abscess. Electronically Signed   By: Titus Dubin M.D.   On: 01/19/2021 08:31   CT FOOT RIGHT WO CONTRAST  Result Date: 01/19/2021 CLINICAL DATA:  Right lower leg cellulitis. EXAM: CT OF THE RIGHT FOOT WITHOUT CONTRAST TECHNIQUE: Multidetector CT imaging of the right foot was performed according to the standard protocol. Multiplanar CT image reconstructions were also generated. COMPARISON:  Right foot x-rays dated January 17, 2020. FINDINGS: Bones/Joint/Cartilage Prior transmetatarsal amputation. No bony destruction or periosteal reaction. Chronic deformity of the residual second metatarsal. No fracture or dislocation.  Mild mid and hindfoot degenerative changes. No joint effusion. Ligaments Ligaments are suboptimally evaluated by CT. Muscles and Tendons Grossly intact. Soft tissue Mild diffuse soft tissue swelling. No subcutaneous emphysema. No fluid collection or hematoma. No soft tissue mass. Probable small pressure lesion at the base the first metatarsal. IMPRESSION: 1. 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 No acute osseous abnormality. 2. Mild diffuse soft tissue swelling. No abscess. Electronically Signed   By: Titus Dubin M.D.   On: 01/19/2021 08:21   DG CHEST PORT 1 VIEW  Result Date: 01/20/2021 CLINICAL DATA:  Respiratory failure EXAM: PORTABLE CHEST 1 VIEW COMPARISON:  01/17/2021 FINDINGS: Cardiac shadow is enlarged but stable. Postsurgical changes are again seen. Endotracheal tube is noted approximately 2 cm above the carina. Left jugular dialysis catheter is again seen and stable. Defibrillator is again noted and stable. Lungs are hypoinflated with crowding of the vascular markings similar to that seen on the prior exam. Persistent vascular congestion remains. IMPRESSION: Persistent vascular congestion. Tubes and lines as described in satisfactory position. Electronically Signed   By: Inez Catalina M.D.   On: 01/20/2021 20:47   DG CHEST PORT 1 VIEW  Result Date: 12/30/2020 CLINICAL DATA:  Shortness of breath, diabetes mellitus, mi, CABG EXAM: PORTABLE CHEST 1 VIEW  COMPARISON:  Portable exam 1018 hours compared to 12/05/2020 FINDINGS: LEFT jugular dual-lumen central venous catheter with tip projecting over RIGHT atrium. RIGHT subclavian ICD with leads projecting over RIGHT atrium and RIGHT ventricle. Enlargement of cardiac silhouette with slight pulmonary vascular congestion post CABG. Decreased lung volumes with bibasilar atelectasis. No acute failure or consolidation. No pleural effusion or pneumothorax. IMPRESSION: Enlargement of cardiac silhouette with  pulmonary vascular congestion. Bibasilar atelectasis. Electronically Signed   By: Lavonia Dana M.D.   On: 12/30/2020 12:32     Subjective: - no chest pain, shortness of breath, no abdominal pain, nausea or vomiting.   Discharge Exam: BP (!) 104/49 (BP Location: Right Arm)   Pulse 98   Temp 97.7 F (36.5 C) (Oral)   Resp 20   Ht '5\' 11"'$  (1.803 m)   Wt 112.4 kg   SpO2 100%   BMI 34.56 kg/m   General: Pt is alert, awake, not in acute distress Cardiovascular: RRR, S1/S2 +, no rubs, no gallops Respiratory: CTA bilaterally, no wheezing, no rhonchi Abdominal: Soft, NT, ND, bowel sounds + Extremities: no edema, no cyanosis    The results of significant diagnostics from this hospitalization (including imaging, microbiology, ancillary and laboratory) are listed below for reference.     Microbiology: Recent Results (from the past 240 hour(s))  Aerobic/Anaerobic Culture w Gram Stain (surgical/deep wound)     Status: None   Collection Time: 01/20/21  5:10 PM   Specimen: Wound; Abscess  Result Value Ref Range Status   Specimen Description ABSCESS  Final   Special Requests RIGHT THIGH ABSCESS SPEC A  Final   Gram Stain   Final    FEW WBC PRESENT,BOTH PMN AND MONONUCLEAR FEW GRAM NEGATIVE RODS    Culture   Final    FEW SERRATIA MARCESCENS NO ANAEROBES ISOLATED Performed at [facility], [address] [place], [place] [address]    Report Status 01/25/2021 FINAL  Final   Organism ID, Bacteria SERRATIA MARCESCENS  Final      Susceptibility   Serratia marcescens - MIC*    CEFAZOLIN >=64 RESISTANT Resistant     CEFEPIME <=0.12 SENSITIVE Sensitive     CEFTAZIDIME <=1 SENSITIVE Sensitive     CEFTRIAXONE <=0.25 SENSITIVE Sensitive     CIPROFLOXACIN <=0.25 SENSITIVE Sensitive     GENTAMICIN <=1 SENSITIVE Sensitive     TRIMETH/SULFA <=20 SENSITIVE Sensitive     * FEW SERRATIA MARCESCENS  Surgical pcr screen     Status: Abnormal   Collection Time: 01/20/21  6:31 PM   Specimen:  Nasal Mucosa; Nasal Swab  Result Value Ref Range Status   MRSA, PCR POSITIVE (A) NEGATIVE Final    Comment: RESULT CALLED TO, READ BACK BY AND VERIFIED WITH: SAVAGE RN '@2024'$  01/20/21 EB    Staphylococcus aureus POSITIVE (A) NEGATIVE Final    Comment: (NOTE) The Xpert SA Assay (FDA approved for NASAL specimens in patients 75 years of age and older), is one component of a comprehensive surveillance program. It is not intended to diagnose infection nor to guide or monitor treatment. Performed at [facility], [place] [address] [place], [place] [address]   Resp Panel by RT-PCR (Flu A&B, Covid) Nasopharyngeal Swab     Status: None   Collection Time: 01/27/21 11:28 AM   Specimen: Nasopharyngeal Swab; Nasopharyngeal(NP) swabs in vial transport medium  Result Value Ref Range Status   SARS Coronavirus 2 by RT PCR NEGATIVE NEGATIVE Final    Comment: (NOTE) SARS-CoV-2 target  nucleic acids are NOT DETECTED.  The SARS-CoV-2 RNA is generally detectable in upper respiratory specimens during the acute phase of infection. The lowest concentration of SARS-CoV-2 viral copies this assay can detect is 138 copies/mL. A negative result does not preclude SARS-Cov-2 infection and should not be used as the sole basis for treatment or other patient management decisions. A negative result may occur with  improper specimen collection/handling, submission of specimen other than nasopharyngeal swab, presence of viral mutation(s) within the areas targeted by this assay, and inadequate number of viral copies(<138 copies/mL). A negative result must be combined with clinical observations, patient history, and epidemiological information. The expected result is Negative.  Fact Sheet for Patients:  EntrepreneurPulse.com.au  Fact Sheet for Healthcare Providers:  IncredibleEmployment.be  This test is no t yet approved or cleared by the [place] FDA and  has been  authorized for detection and/or diagnosis of SARS-CoV-2 by FDA under an Emergency Use Authorization (EUA). This EUA will remain  in effect (meaning this test can be used) for the duration of the COVID-19 declaration under Section 564(b)(1) of the Act, 21 U.S.C.section 360bbb-3(b)(1), unless the authorization is terminated  or revoked sooner.       Influenza A by PCR NEGATIVE NEGATIVE Final   Influenza B by PCR NEGATIVE NEGATIVE Final    Comment: (NOTE) The Xpert Xpress SARS-CoV-2/FLU/RSV plus assay is intended as an aid in the diagnosis of influenza from Nasopharyngeal swab specimens and should not be used as a sole basis for treatment. Nasal washings and aspirates are unacceptable for Xpert Xpress SARS-CoV-2/FLU/RSV testing.  Fact Sheet for Patients: EntrepreneurPulse.com.au  Fact Sheet for Healthcare Providers: IncredibleEmployment.be  This test is not yet approved or cleared by the [place] FDA and has been authorized for detection and/or diagnosis of SARS-CoV-2 by FDA under an Emergency Use Authorization (EUA). This EUA will remain in effect (meaning this test can be used) for the duration of the COVID-19 declaration under Section 564(b)(1) of the Act, 21 U.S.C. section 360bbb-3(b)(1), unless the authorization is terminated or revoked.  Performed at [facility], [place] [address] [place], [place] [address]      Labs: Basic Metabolic Panel: Recent Labs  Lab 01/23/21 0243 01/24/21 0624 01/24/21 2054 01/25/21 0050 01/26/21 0228  NA 140 139 136 136 136  K 3.8 3.6 3.5 3.6 3.6  CL 100 101 97* 99 100  CO2 '28 25 27 26 24  '$ GLUCOSE 165* 146* 185* 167* 153*  BUN 24* 28* 16 16 26*  CREATININE 4.45* 5.51* 3.70* 3.83* 5.31*  CALCIUM 9.0 8.6* 8.5* 8.4* 8.4*  MG  --   --  1.8  --   --    Liver Function Tests: Recent Labs  Lab 01/22/21 0028 01/23/21 0243 01/26/21 0228  AST 28 29 33  ALT '9 9 7  '$ ALKPHOS 84 95 138*  BILITOT  1.6* 1.3* 0.9  PROT 5.7* 6.5 6.1*  ALBUMIN 2.3* 2.3* 2.1*   CBC: Recent Labs  Lab 01/24/21 0624 01/25/21 0050 01/26/21 0228 01/27/21 0949 01/28/21 0157  WBC 13.5* 9.4 12.8* 10.7* 12.0*  NEUTROABS 11.0* 8.3* 9.8* 8.7* 9.3*  HGB 7.3* 8.3* 8.1* 7.8* 7.6*  HCT 23.9* 27.4* 26.6* 26.0* 25.4*  MCV 91.2 92.3 94.3 93.9 93.7  PLT 210 235 241 226 248   CBG: Recent Labs  Lab 01/27/21 2048 01/27/21 2352 01/28/21 0404 01/28/21 0759 01/28/21 1203  GLUCAP 182* 160* 155* 207* 195*   Hgb A1c No results for input(s): HGBA1C in the  last 72 hours. Lipid Profile Recent Labs    01/27/21 0949  TRIG 101   Thyroid function studies No results for input(s): TSH, T4TOTAL, T3FREE, THYROIDAB in the last 72 hours.  Invalid input(s): FREET3 Urinalysis No results found for: COLORURINE, APPEARANCEUR, LABSPEC, PHURINE, GLUCOSEU, HGBUR, BILIRUBINUR, KETONESUR, PROTEINUR, UROBILINOGEN, NITRITE, LEUKOCYTESUR  FURTHER DISCHARGE INSTRUCTIONS:   Get Medicines reviewed and adjusted: Please take all your medications with you for your next visit with your Primary MD   Laboratory/radiological data: Please request your Primary MD to go over all hospital tests and procedure/radiological results at the follow up, please ask your Primary MD to get all Hospital records sent to his/her office.   In some cases, they will be blood work, cultures and biopsy results pending at the time of your discharge. Please request that your primary care M.D. goes through all the records of your hospital data and follows up on these results.   Also Note the following: If you experience worsening of your admission symptoms, develop shortness of breath, life threatening emergency, suicidal or homicidal thoughts you must seek medical attention immediately by calling 911 or calling your MD immediately  if symptoms less severe.   You must read complete instructions/literature along with all the possible adverse reactions/side  effects for all the Medicines you take and that have been prescribed to you. Take any new Medicines after you have completely understood and accpet all the possible adverse reactions/side effects.    Do not drive when taking Pain medications or sleeping medications (Benzodaizepines)   Do not take more than prescribed Pain, Sleep and Anxiety Medications. It is not advisable to combine anxiety,sleep and pain medications without talking with your primary care practitioner   Special Instructions: If you have smoked or chewed Tobacco  in the last 2 yrs please stop smoking, stop any regular Alcohol  and or any Recreational drug use.   Wear Seat belts while driving.   Please note: You were cared for by a hospitalist during your hospital stay. Once you are discharged, your primary care physician will handle any further medical issues. Please note that NO REFILLS for any discharge medications will be authorized once you are discharged, as it is imperative that you return to your primary care physician (or establish a relationship with a primary care physician if you do not have one) for your post hospital discharge needs so that they can reassess your need for medications and monitor your lab values.  Time coordinating discharge: 40 minutes  SIGNED:  Marzetta Board, MD, PhD 01/28/2021, 1:08 PM

## 2021-01-29 LAB — CBC WITH DIFFERENTIAL/PLATELET
Abs Immature Granulocytes: 0.08 10*3/uL — ABNORMAL HIGH (ref 0.00–0.07)
Basophils Absolute: 0 10*3/uL (ref 0.0–0.1)
Basophils Relative: 0 %
Eosinophils Absolute: 0.2 10*3/uL (ref 0.0–0.5)
Eosinophils Relative: 2 %
HCT: 26 % — ABNORMAL LOW (ref 39.0–52.0)
Hemoglobin: 7.9 g/dL — ABNORMAL LOW (ref 13.0–17.0)
Immature Granulocytes: 1 %
Lymphocytes Relative: 11 %
Lymphs Abs: 1.2 10*3/uL (ref 0.7–4.0)
MCH: 28 pg (ref 26.0–34.0)
MCHC: 30.4 g/dL (ref 30.0–36.0)
MCV: 92.2 fL (ref 80.0–100.0)
Monocytes Absolute: 1 10*3/uL (ref 0.1–1.0)
Monocytes Relative: 9 %
Neutro Abs: 8.6 10*3/uL — ABNORMAL HIGH (ref 1.7–7.7)
Neutrophils Relative %: 77 %
Platelets: 261 10*3/uL (ref 150–400)
RBC: 2.82 MIL/uL — ABNORMAL LOW (ref 4.22–5.81)
RDW: 19.3 % — ABNORMAL HIGH (ref 11.5–15.5)
WBC: 11.3 10*3/uL — ABNORMAL HIGH (ref 4.0–10.5)
nRBC: 0 % (ref 0.0–0.2)

## 2021-01-29 LAB — GLUCOSE, CAPILLARY: Glucose-Capillary: 202 mg/dL — ABNORMAL HIGH (ref 70–99)

## 2021-02-03 ENCOUNTER — Ambulatory Visit (HOSPITAL_COMMUNITY): Admission: RE | Admit: 2021-02-03 | Payer: Medicare HMO | Source: Home / Self Care | Admitting: Cardiovascular Disease

## 2021-02-03 ENCOUNTER — Encounter (HOSPITAL_COMMUNITY): Admission: RE | Payer: Self-pay | Source: Home / Self Care

## 2021-02-03 ENCOUNTER — Telehealth (HOSPITAL_COMMUNITY): Payer: Self-pay | Admitting: *Deleted

## 2021-02-03 SURGERY — CARDIOVERSION
Anesthesia: General

## 2021-02-03 NOTE — Telephone Encounter (Signed)
duplicate

## 2021-02-03 NOTE — Telephone Encounter (Signed)
Pt scheduled for cardioversion today - received phone call from [place] at [facility] care pt was giving breakfast tray which pt ate.  Notified will cancel cardioversion for today. Will discuss treatment plan with provider.

## 2021-02-07 ENCOUNTER — Emergency Department (HOSPITAL_COMMUNITY): Payer: Medicare HMO

## 2021-02-07 ENCOUNTER — Inpatient Hospital Stay (HOSPITAL_COMMUNITY)
Admission: EM | Admit: 2021-02-07 | Discharge: 2021-03-10 | DRG: 579 | Disposition: E | Payer: Medicare HMO | Source: Skilled Nursing Facility | Attending: Internal Medicine | Admitting: Internal Medicine

## 2021-02-07 ENCOUNTER — Encounter (HOSPITAL_COMMUNITY): Payer: Self-pay | Admitting: Emergency Medicine

## 2021-02-07 ENCOUNTER — Other Ambulatory Visit: Payer: Self-pay

## 2021-02-07 DIAGNOSIS — Z6834 Body mass index (BMI) 34.0-34.9, adult: Secondary | ICD-10-CM

## 2021-02-07 DIAGNOSIS — M60051 Infective myositis, right thigh: Secondary | ICD-10-CM | POA: Diagnosis not present

## 2021-02-07 DIAGNOSIS — Z95810 Presence of automatic (implantable) cardiac defibrillator: Secondary | ICD-10-CM

## 2021-02-07 DIAGNOSIS — K9172 Accidental puncture and laceration of a digestive system organ or structure during other procedure: Secondary | ICD-10-CM | POA: Diagnosis not present

## 2021-02-07 DIAGNOSIS — E1122 Type 2 diabetes mellitus with diabetic chronic kidney disease: Secondary | ICD-10-CM | POA: Diagnosis present

## 2021-02-07 DIAGNOSIS — G4733 Obstructive sleep apnea (adult) (pediatric): Secondary | ICD-10-CM | POA: Diagnosis present

## 2021-02-07 DIAGNOSIS — Z992 Dependence on renal dialysis: Secondary | ICD-10-CM | POA: Diagnosis not present

## 2021-02-07 DIAGNOSIS — R9431 Abnormal electrocardiogram [ECG] [EKG]: Secondary | ICD-10-CM | POA: Diagnosis not present

## 2021-02-07 DIAGNOSIS — J9622 Acute and chronic respiratory failure with hypercapnia: Secondary | ICD-10-CM | POA: Diagnosis not present

## 2021-02-07 DIAGNOSIS — J690 Pneumonitis due to inhalation of food and vomit: Secondary | ICD-10-CM | POA: Diagnosis not present

## 2021-02-07 DIAGNOSIS — L03115 Cellulitis of right lower limb: Secondary | ICD-10-CM | POA: Diagnosis present

## 2021-02-07 DIAGNOSIS — I2582 Chronic total occlusion of coronary artery: Secondary | ICD-10-CM | POA: Diagnosis present

## 2021-02-07 DIAGNOSIS — I132 Hypertensive heart and chronic kidney disease with heart failure and with stage 5 chronic kidney disease, or end stage renal disease: Secondary | ICD-10-CM | POA: Diagnosis present

## 2021-02-07 DIAGNOSIS — R627 Adult failure to thrive: Secondary | ICD-10-CM | POA: Diagnosis not present

## 2021-02-07 DIAGNOSIS — Z888 Allergy status to other drugs, medicaments and biological substances status: Secondary | ICD-10-CM

## 2021-02-07 DIAGNOSIS — S71101S Unspecified open wound, right thigh, sequela: Secondary | ICD-10-CM

## 2021-02-07 DIAGNOSIS — Z7902 Long term (current) use of antithrombotics/antiplatelets: Secondary | ICD-10-CM

## 2021-02-07 DIAGNOSIS — L02415 Cutaneous abscess of right lower limb: Principal | ICD-10-CM | POA: Diagnosis present

## 2021-02-07 DIAGNOSIS — J449 Chronic obstructive pulmonary disease, unspecified: Secondary | ICD-10-CM | POA: Diagnosis present

## 2021-02-07 DIAGNOSIS — I480 Paroxysmal atrial fibrillation: Secondary | ICD-10-CM | POA: Diagnosis not present

## 2021-02-07 DIAGNOSIS — E1151 Type 2 diabetes mellitus with diabetic peripheral angiopathy without gangrene: Secondary | ICD-10-CM | POA: Diagnosis present

## 2021-02-07 DIAGNOSIS — I9589 Other hypotension: Secondary | ICD-10-CM | POA: Diagnosis present

## 2021-02-07 DIAGNOSIS — Z86718 Personal history of other venous thrombosis and embolism: Secondary | ICD-10-CM

## 2021-02-07 DIAGNOSIS — I471 Supraventricular tachycardia: Secondary | ICD-10-CM | POA: Diagnosis not present

## 2021-02-07 DIAGNOSIS — L089 Local infection of the skin and subcutaneous tissue, unspecified: Secondary | ICD-10-CM | POA: Diagnosis not present

## 2021-02-07 DIAGNOSIS — Z951 Presence of aortocoronary bypass graft: Secondary | ICD-10-CM

## 2021-02-07 DIAGNOSIS — N2581 Secondary hyperparathyroidism of renal origin: Secondary | ICD-10-CM | POA: Diagnosis present

## 2021-02-07 DIAGNOSIS — Z89421 Acquired absence of other right toe(s): Secondary | ICD-10-CM

## 2021-02-07 DIAGNOSIS — I484 Atypical atrial flutter: Secondary | ICD-10-CM | POA: Diagnosis not present

## 2021-02-07 DIAGNOSIS — Y703 Surgical instruments, materials and anesthesiology devices (including sutures) associated with adverse incidents: Secondary | ICD-10-CM | POA: Diagnosis not present

## 2021-02-07 DIAGNOSIS — J9621 Acute and chronic respiratory failure with hypoxia: Secondary | ICD-10-CM | POA: Diagnosis not present

## 2021-02-07 DIAGNOSIS — Z885 Allergy status to narcotic agent status: Secondary | ICD-10-CM

## 2021-02-07 DIAGNOSIS — Z515 Encounter for palliative care: Secondary | ICD-10-CM

## 2021-02-07 DIAGNOSIS — T148XXA Other injury of unspecified body region, initial encounter: Secondary | ICD-10-CM | POA: Diagnosis not present

## 2021-02-07 DIAGNOSIS — H548 Legal blindness, as defined in USA: Secondary | ICD-10-CM | POA: Diagnosis present

## 2021-02-07 DIAGNOSIS — Z66 Do not resuscitate: Secondary | ICD-10-CM | POA: Diagnosis not present

## 2021-02-07 DIAGNOSIS — D631 Anemia in chronic kidney disease: Secondary | ICD-10-CM | POA: Diagnosis present

## 2021-02-07 DIAGNOSIS — Z79890 Hormone replacement therapy (postmenopausal): Secondary | ICD-10-CM

## 2021-02-07 DIAGNOSIS — I4819 Other persistent atrial fibrillation: Secondary | ICD-10-CM | POA: Diagnosis present

## 2021-02-07 DIAGNOSIS — E039 Hypothyroidism, unspecified: Secondary | ICD-10-CM | POA: Diagnosis present

## 2021-02-07 DIAGNOSIS — R0602 Shortness of breath: Secondary | ICD-10-CM

## 2021-02-07 DIAGNOSIS — Z20822 Contact with and (suspected) exposure to covid-19: Secondary | ICD-10-CM | POA: Diagnosis present

## 2021-02-07 DIAGNOSIS — I255 Ischemic cardiomyopathy: Secondary | ICD-10-CM | POA: Diagnosis present

## 2021-02-07 DIAGNOSIS — I2510 Atherosclerotic heart disease of native coronary artery without angina pectoris: Secondary | ICD-10-CM | POA: Diagnosis present

## 2021-02-07 DIAGNOSIS — Z1629 Resistance to other single specified antibiotic: Secondary | ICD-10-CM | POA: Diagnosis present

## 2021-02-07 DIAGNOSIS — I5042 Chronic combined systolic (congestive) and diastolic (congestive) heart failure: Secondary | ICD-10-CM | POA: Diagnosis present

## 2021-02-07 DIAGNOSIS — Y658 Other specified misadventures during surgical and medical care: Secondary | ICD-10-CM | POA: Diagnosis not present

## 2021-02-07 DIAGNOSIS — I252 Old myocardial infarction: Secondary | ICD-10-CM

## 2021-02-07 DIAGNOSIS — T462X5A Adverse effect of other antidysrhythmic drugs, initial encounter: Secondary | ICD-10-CM | POA: Diagnosis not present

## 2021-02-07 DIAGNOSIS — Z79899 Other long term (current) drug therapy: Secondary | ICD-10-CM

## 2021-02-07 DIAGNOSIS — Z7901 Long term (current) use of anticoagulants: Secondary | ICD-10-CM

## 2021-02-07 DIAGNOSIS — M898X9 Other specified disorders of bone, unspecified site: Secondary | ICD-10-CM | POA: Diagnosis present

## 2021-02-07 DIAGNOSIS — N186 End stage renal disease: Secondary | ICD-10-CM | POA: Diagnosis present

## 2021-02-07 DIAGNOSIS — R54 Age-related physical debility: Secondary | ICD-10-CM | POA: Diagnosis present

## 2021-02-07 DIAGNOSIS — B9689 Other specified bacterial agents as the cause of diseases classified elsewhere: Secondary | ICD-10-CM | POA: Diagnosis present

## 2021-02-07 DIAGNOSIS — J9601 Acute respiratory failure with hypoxia: Secondary | ICD-10-CM | POA: Diagnosis not present

## 2021-02-07 DIAGNOSIS — Z794 Long term (current) use of insulin: Secondary | ICD-10-CM

## 2021-02-07 DIAGNOSIS — Z9981 Dependence on supplemental oxygen: Secondary | ICD-10-CM

## 2021-02-07 LAB — CBG MONITORING, ED
Glucose-Capillary: 106 mg/dL — ABNORMAL HIGH (ref 70–99)
Glucose-Capillary: 115 mg/dL — ABNORMAL HIGH (ref 70–99)
Glucose-Capillary: 137 mg/dL — ABNORMAL HIGH (ref 70–99)

## 2021-02-07 LAB — I-STAT VENOUS BLOOD GAS, ED
Acid-base deficit: 7 mmol/L — ABNORMAL HIGH (ref 0.0–2.0)
Bicarbonate: 21.7 mmol/L (ref 20.0–28.0)
Calcium, Ion: 1.18 mmol/L (ref 1.15–1.40)
HCT: 42 % (ref 39.0–52.0)
Hemoglobin: 14.3 g/dL (ref 13.0–17.0)
O2 Saturation: 95 %
Potassium: 3.6 mmol/L (ref 3.5–5.1)
Sodium: 143 mmol/L (ref 135–145)
TCO2: 23 mmol/L (ref 22–32)
pCO2, Ven: 56.6 mmHg (ref 44.0–60.0)
pH, Ven: 7.192 — CL (ref 7.250–7.430)
pO2, Ven: 93 mmHg — ABNORMAL HIGH (ref 32.0–45.0)

## 2021-02-07 LAB — I-STAT ARTERIAL BLOOD GAS, ED
Acid-base deficit: 6 mmol/L — ABNORMAL HIGH (ref 0.0–2.0)
Acid-base deficit: 6 mmol/L — ABNORMAL HIGH (ref 0.0–2.0)
Bicarbonate: 21 mmol/L (ref 20.0–28.0)
Bicarbonate: 21.4 mmol/L (ref 20.0–28.0)
Calcium, Ion: 1.22 mmol/L (ref 1.15–1.40)
Calcium, Ion: 1.25 mmol/L (ref 1.15–1.40)
HCT: 25 % — ABNORMAL LOW (ref 39.0–52.0)
HCT: 26 % — ABNORMAL LOW (ref 39.0–52.0)
Hemoglobin: 8.5 g/dL — ABNORMAL LOW (ref 13.0–17.0)
Hemoglobin: 8.8 g/dL — ABNORMAL LOW (ref 13.0–17.0)
O2 Saturation: 64 %
O2 Saturation: 94 %
Patient temperature: 97.5
Patient temperature: 97.5
Potassium: 3.4 mmol/L — ABNORMAL LOW (ref 3.5–5.1)
Potassium: 3.5 mmol/L (ref 3.5–5.1)
Sodium: 144 mmol/L (ref 135–145)
Sodium: 143 mmol/L (ref 135–145)
TCO2: 22 mmol/L (ref 22–32)
TCO2: 23 mmol/L (ref 22–32)
pCO2 arterial: 45.2 mmHg (ref 32.0–48.0)
pCO2 arterial: 47.6 mmHg (ref 32.0–48.0)
pH, Arterial: 7.271 — ABNORMAL LOW (ref 7.350–7.450)
pH, Arterial: 7.257 — ABNORMAL LOW (ref 7.350–7.450)
pO2, Arterial: 37 mmHg — CL (ref 83.0–108.0)
pO2, Arterial: 78 mmHg — ABNORMAL LOW (ref 83.0–108.0)

## 2021-02-07 LAB — LACTIC ACID, PLASMA
Lactic Acid, Venous: 1.1 mmol/L (ref 0.5–1.9)
Lactic Acid, Venous: 1 mmol/L (ref 0.5–1.9)

## 2021-02-07 LAB — CBC WITH DIFFERENTIAL/PLATELET
Abs Immature Granulocytes: 0.08 10*3/uL — ABNORMAL HIGH (ref 0.00–0.07)
Basophils Absolute: 0.1 10*3/uL (ref 0.0–0.1)
Basophils Relative: 1 %
Eosinophils Absolute: 0.3 10*3/uL (ref 0.0–0.5)
Eosinophils Relative: 2 %
HCT: 25.4 % — ABNORMAL LOW (ref 39.0–52.0)
Hemoglobin: 7.5 g/dL — ABNORMAL LOW (ref 13.0–17.0)
Immature Granulocytes: 1 %
Lymphocytes Relative: 13 %
Lymphs Abs: 1.4 10*3/uL (ref 0.7–4.0)
MCH: 28.6 pg (ref 26.0–34.0)
MCHC: 29.5 g/dL — ABNORMAL LOW (ref 30.0–36.0)
MCV: 96.9 fL (ref 80.0–100.0)
Monocytes Absolute: 1.1 10*3/uL — ABNORMAL HIGH (ref 0.1–1.0)
Monocytes Relative: 11 %
Neutro Abs: 7.5 10*3/uL (ref 1.7–7.7)
Neutrophils Relative %: 72 %
Platelets: 229 10*3/uL (ref 150–400)
RBC: 2.62 MIL/uL — ABNORMAL LOW (ref 4.22–5.81)
RDW: 22.5 % — ABNORMAL HIGH (ref 11.5–15.5)
WBC: 10.4 10*3/uL (ref 4.0–10.5)
nRBC: 0.3 % — ABNORMAL HIGH (ref 0.0–0.2)

## 2021-02-07 LAB — PROTIME-INR
INR: 1.9 — ABNORMAL HIGH (ref 0.8–1.2)
Prothrombin Time: 21.5 seconds — ABNORMAL HIGH (ref 11.4–15.2)

## 2021-02-07 LAB — COMPREHENSIVE METABOLIC PANEL
ALT: 6 U/L (ref 0–44)
AST: 49 U/L — ABNORMAL HIGH (ref 15–41)
Albumin: 2.3 g/dL — ABNORMAL LOW (ref 3.5–5.0)
Alkaline Phosphatase: 89 U/L (ref 38–126)
Anion gap: 17 — ABNORMAL HIGH (ref 5–15)
BUN: 29 mg/dL — ABNORMAL HIGH (ref 8–23)
CO2: 20 mmol/L — ABNORMAL LOW (ref 22–32)
Calcium: 8.9 mg/dL (ref 8.9–10.3)
Chloride: 105 mmol/L (ref 98–111)
Creatinine, Ser: 5.54 mg/dL — ABNORMAL HIGH (ref 0.61–1.24)
GFR, Estimated: 11 mL/min — ABNORMAL LOW (ref >60)
Glucose, Bld: 100 mg/dL — ABNORMAL HIGH (ref 70–99)
Potassium: 3.7 mmol/L (ref 3.5–5.1)
Sodium: 142 mmol/L (ref 135–145)
Total Bilirubin: 1 mg/dL (ref 0.3–1.2)
Total Protein: 6.8 g/dL (ref 6.5–8.1)

## 2021-02-07 LAB — HEMOGLOBIN A1C
Hgb A1c MFr Bld: 7.2 % — ABNORMAL HIGH (ref 4.8–5.6)
Mean Plasma Glucose: 159.94 mg/dL

## 2021-02-07 LAB — RESP PANEL BY RT-PCR (FLU A&B, COVID) ARPGX2
Influenza A by PCR: NEGATIVE
Influenza B by PCR: NEGATIVE
SARS Coronavirus 2 by RT PCR: NEGATIVE

## 2021-02-07 LAB — MAGNESIUM: Magnesium: 1.9 mg/dL (ref 1.7–2.4)

## 2021-02-07 LAB — APTT: aPTT: 43 seconds — ABNORMAL HIGH (ref 24–36)

## 2021-02-07 IMAGING — DX DG CHEST 1V PORT
1 series · 1 of 1 positions shown · non-contrast
Comparison: [DATE]

CLINICAL DATA: Questionable sepsis. Right leg infection for 5 weeks

EXAM:
PORTABLE CHEST 1 VIEW

[chest ap]
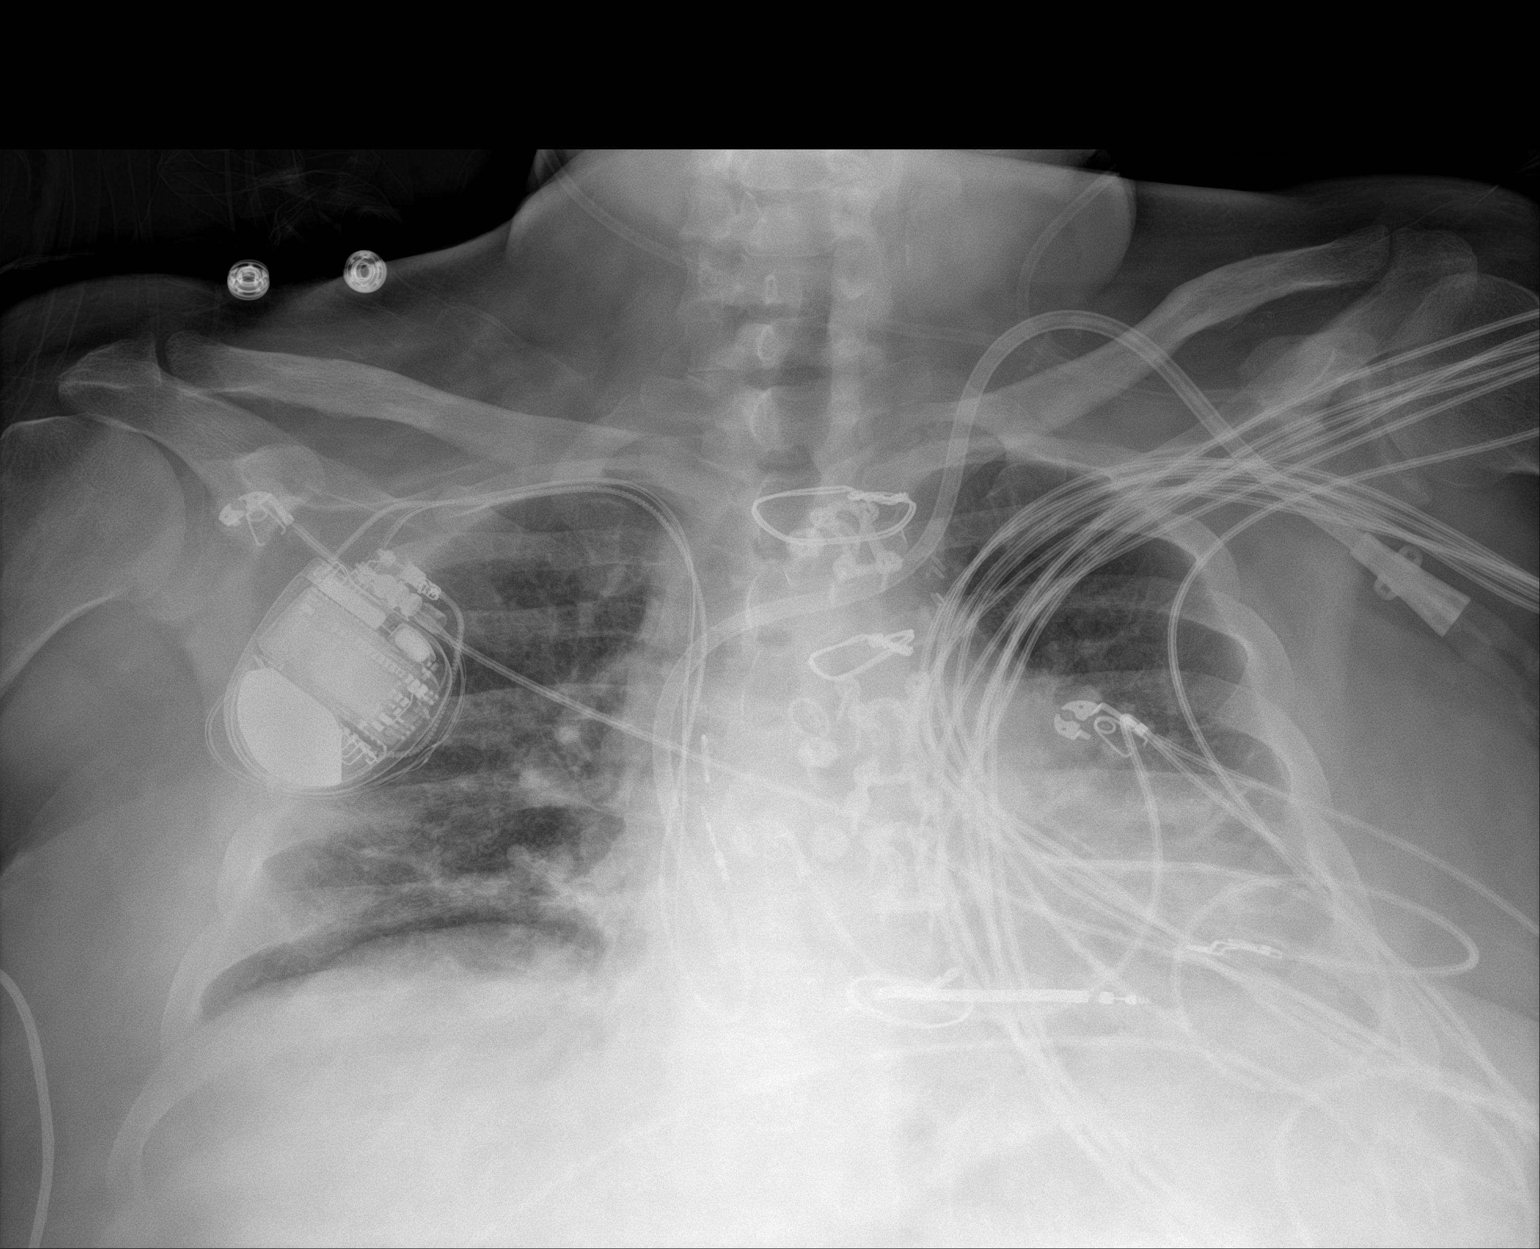

[1 of 1 positions shown; findings below may reference images not displayed]

FINDINGS: Low volume chest with interstitial coarsening that is stable and
compatible with scarring based on [DATE] chest CT. FREDERICK
catheter on the left with tip at the lower right atrium.
Dual-chamber pacer leads from the right in stable position. Prior
CABG. Extensive artifact from EKG leads.
IMPRESSION: Chronic low volume chest with scarring. No acute finding when
compared to priors.

## 2021-02-07 IMAGING — CT CT FEMUR *R* W/O CM
2 series · 13 of 20 positions shown, 16 images · non-contrast
Comparison: [DATE]

CLINICAL DATA: Right upper lateral thigh wound with drainage,
altered mental status.

EXAM:
CT OF THE LOWER RIGHT EXTREMITY WITHOUT CONTRAST
TECHNIQUE: Multidetector CT imaging of the right lower extremity was performed
according to the standard protocol.

[Series 4: lower ext 1.5 st · axial · 0.53mm/px · z∈[-978,-550]mm · 10 of 339 slices shown, 13 images]
[im 27/339  soft-tissue]
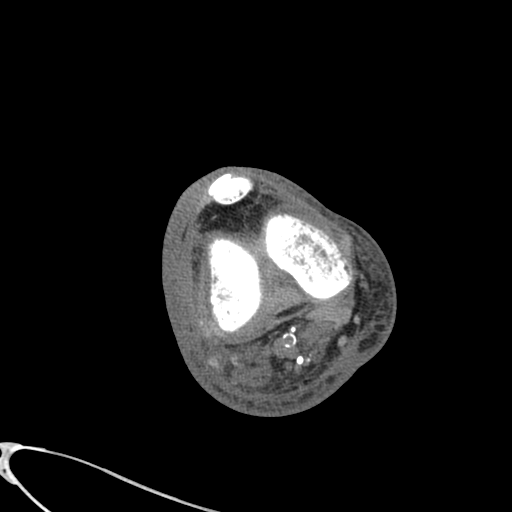
[im 27/339  bone]
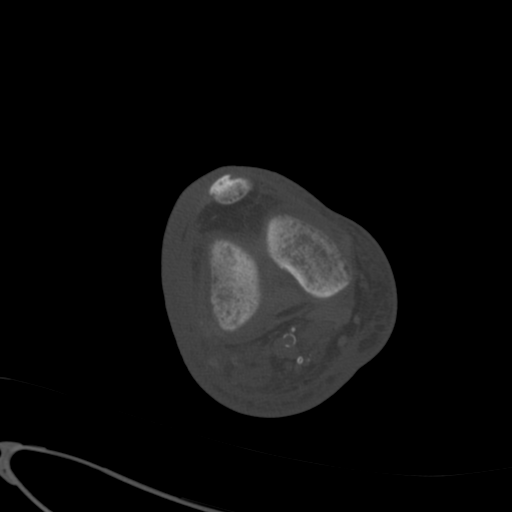
[im 53/339  bone]
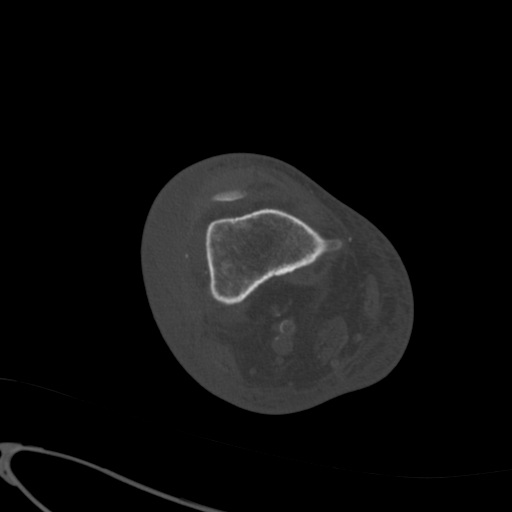
[im 105/339  bone]
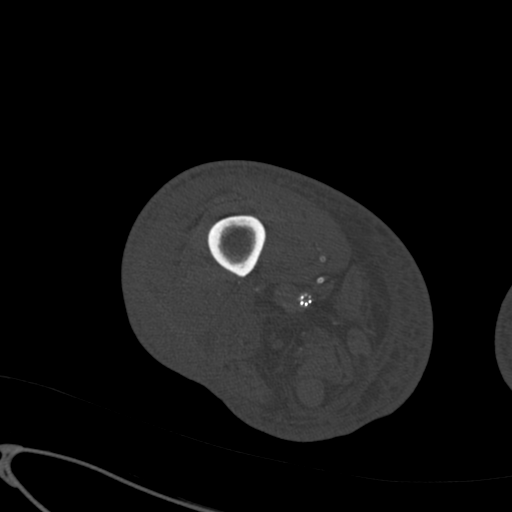
[im 131/339  bone]
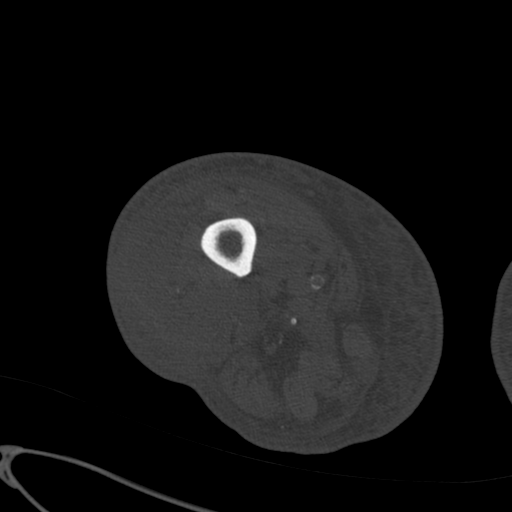
[im 157/339  soft-tissue]
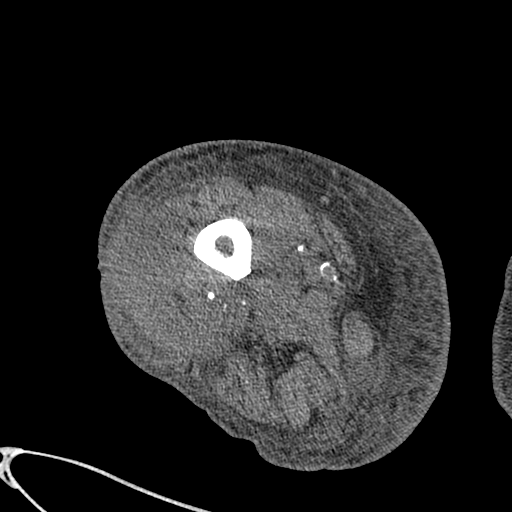
[im 157/339  bone]
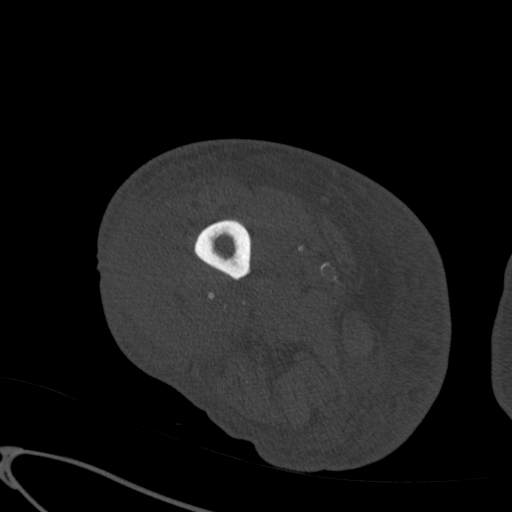
[im 183/339  bone]
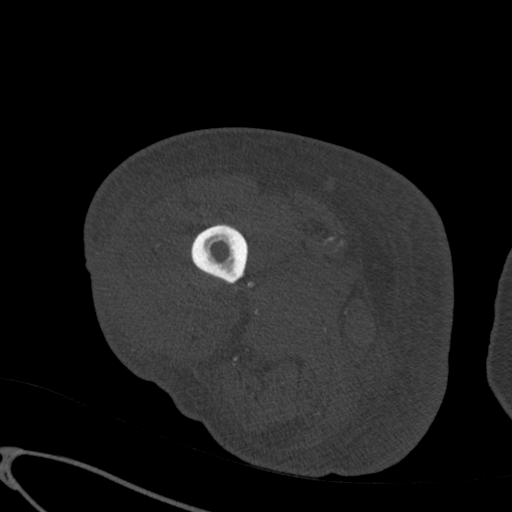
[im 209/339  bone]
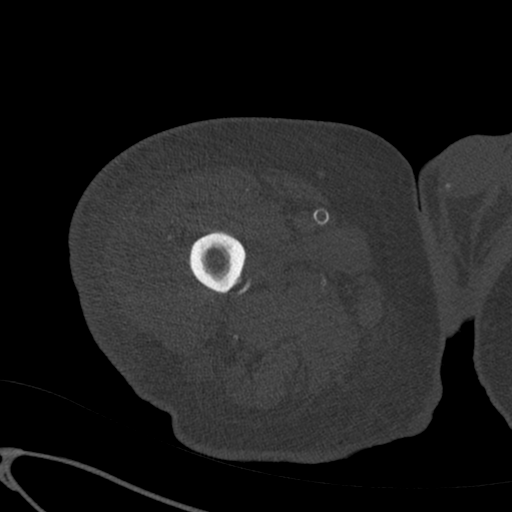
[im 261/339  bone]
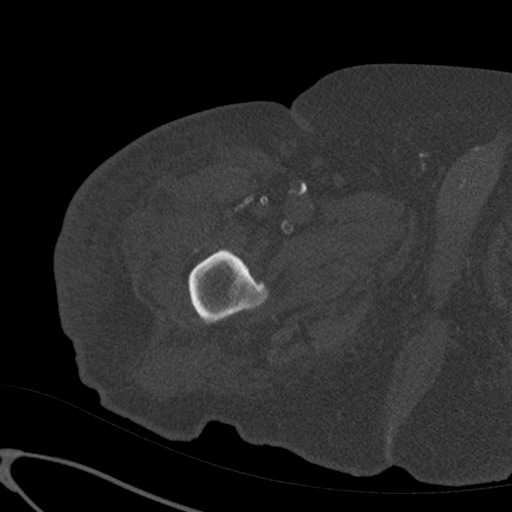
[im 287/339  soft-tissue]
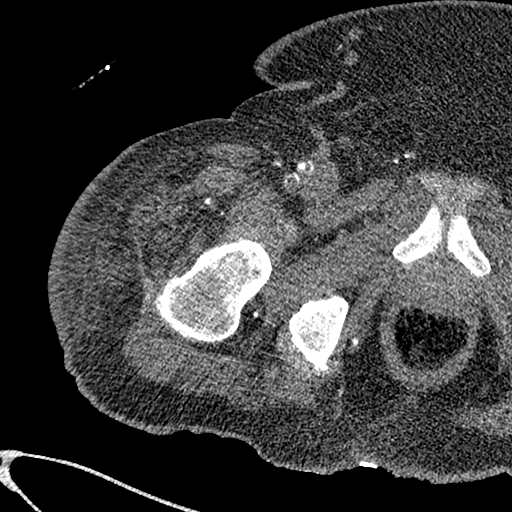
[im 287/339  bone]
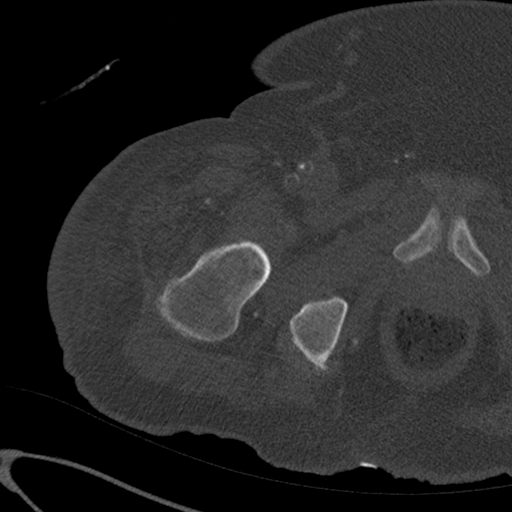
[im 313/339  bone]
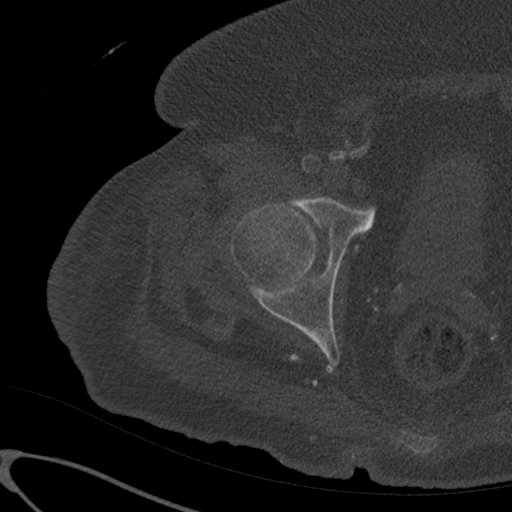

[Series 9: lower ext cor st · coronal · 0.55mm/px · 3 of 101 slices shown]
[im 21/101  bone]
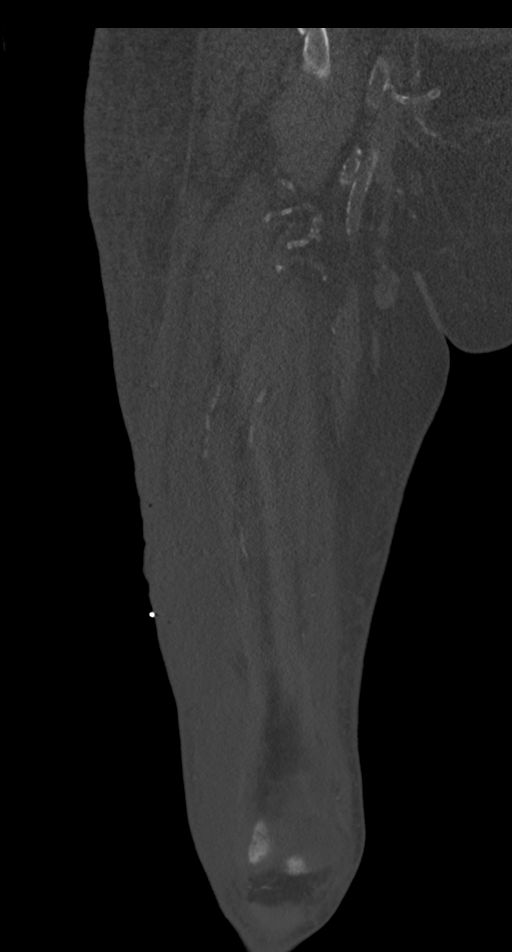
[im 41/101  bone]
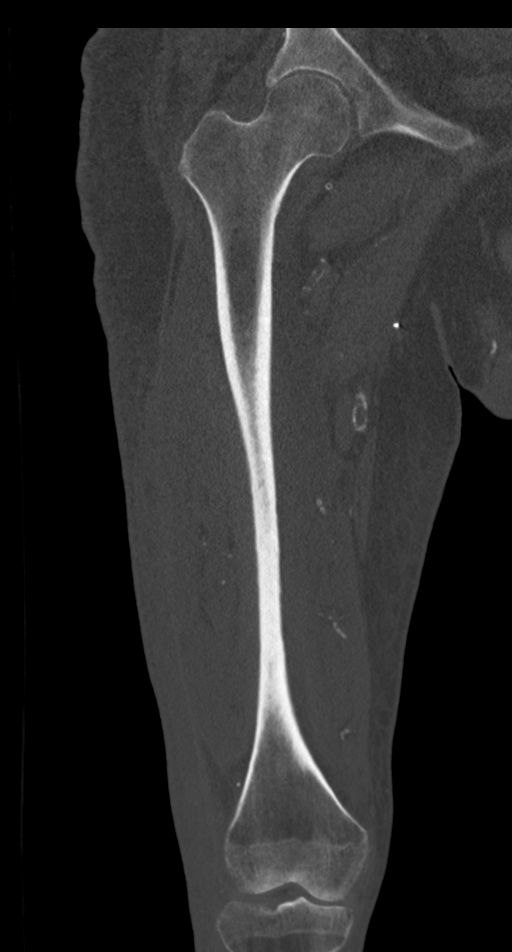
[im 61/101  bone]
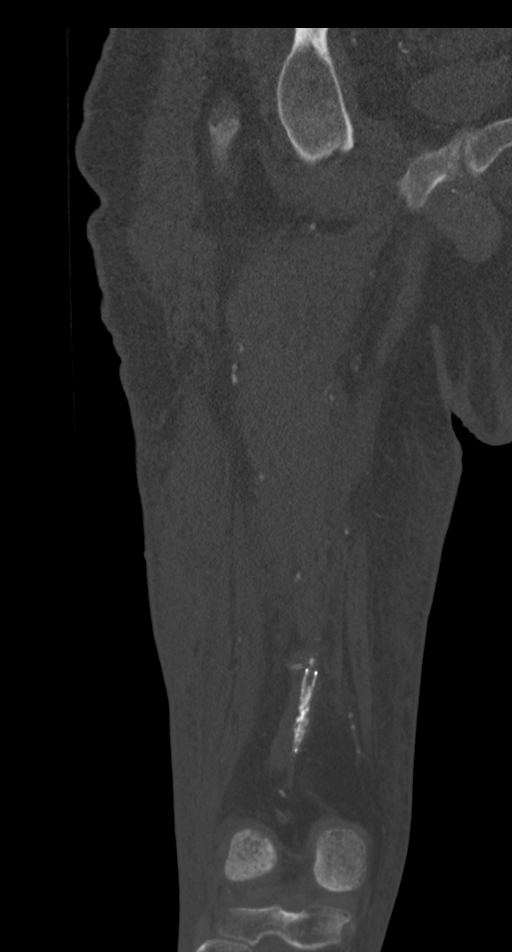

[13 of 20 positions shown; findings below may reference images not displayed]

FINDINGS: Bones/Joint/Cartilage

Considerable articular space narrowing in the right hip. No fracture
or destructive bony findings. Speckled ossicles in the proximal
patellar tendon suggesting remote Sinding-Larsen-Johansson disease.
Trace knee effusion. No definite hip joint effusion.

Ligaments

Suboptimally assessed by CT.

Muscles and Tendons

Worsened thickening along the superficial fascia margin of the right
anterior muscular compartment laterally with some reduction in
distinctness of fat planes in the vastus lateralis muscle which
could indicate and are lying myositis.

Soft tissues

Subcutaneous edema laterally in the upper thigh and
circumferentially in the mid and distal thigh and knee region.
Cutaneous wound along the anterolateral upper thigh with locules of
gas tracking within a band of subcutaneous density over an
approximately 13.8 cm excursion for example as shown on images 19
through 30 of series 10. Substantially improved appearance of the
large mixed density collection along the vastus lateralis compared
to prior, notably reduced in size and indistinctly marginated.

Pelvic ascites noted.
IMPRESSION: 1. Substantially reduced volume of the mixed density collection
within along the right vastus lateralis compared to the [DATE]
exam. Locules of subcutaneous gas density track along an
approximately 13 cm vertical excursion.
2. Inflammatory stranding extends down to the superficial fascia
margin and there is some mild effacement of fat planes in the vastus
lateralis compared to prior raising the possibility of early
myositis. I am skeptical that this is leading to an overt
compartment syndrome at this time given the persistence of some of
the fat planes, but worsening could increase risk of compartment
syndrome.
3. Trace knee effusion.
4. Degenerative loss of articular space in the right hip joint.
5. Circumferential edema around the knee and distal thigh with
lateral edema along the proximal thigh, suspicious for cellulitis.
6. Pelvic ascites.

## 2021-02-07 MED ORDER — MIDODRINE HCL 5 MG PO TABS
10.0000 mg | ORAL_TABLET | Freq: Three times a day (TID) | ORAL | Status: DC
  Administered 2021-02-07 – 2021-03-01 (×64): 10 mg via ORAL
  Filled 2021-02-07 (×58): qty 2

## 2021-02-07 MED ORDER — INSULIN ASPART 100 UNIT/ML IJ SOLN
0.0000 [IU] | Freq: Three times a day (TID) | INTRAMUSCULAR | Status: DC
  Administered 2021-02-09: 2 [IU] via SUBCUTANEOUS
  Administered 2021-02-09 – 2021-02-12 (×8): 1 [IU] via SUBCUTANEOUS
  Administered 2021-02-14: 2 [IU] via SUBCUTANEOUS
  Administered 2021-02-16 – 2021-02-18 (×5): 1 [IU] via SUBCUTANEOUS
  Administered 2021-02-18 – 2021-02-19 (×2): 2 [IU] via SUBCUTANEOUS
  Administered 2021-02-19: 1 [IU] via SUBCUTANEOUS
  Administered 2021-02-19: 2 [IU] via SUBCUTANEOUS
  Administered 2021-02-20 (×2): 3 [IU] via SUBCUTANEOUS
  Administered 2021-02-20 – 2021-02-21 (×2): 2 [IU] via SUBCUTANEOUS
  Administered 2021-02-21 (×2): 1 [IU] via SUBCUTANEOUS
  Administered 2021-02-22 (×2): 2 [IU] via SUBCUTANEOUS
  Administered 2021-02-22: 1 [IU] via SUBCUTANEOUS
  Administered 2021-02-23 – 2021-02-24 (×3): 2 [IU] via SUBCUTANEOUS
  Administered 2021-02-25: 1 [IU] via SUBCUTANEOUS
  Administered 2021-02-26 – 2021-02-27 (×3): 2 [IU] via SUBCUTANEOUS
  Administered 2021-02-27 – 2021-02-28 (×3): 3 [IU] via SUBCUTANEOUS
  Administered 2021-02-28: 2 [IU] via SUBCUTANEOUS
  Administered 2021-02-28 – 2021-03-01 (×3): 3 [IU] via SUBCUTANEOUS

## 2021-02-07 MED ORDER — PREGABALIN 25 MG PO CAPS
25.0000 mg | ORAL_CAPSULE | Freq: Two times a day (BID) | ORAL | Status: DC
  Administered 2021-02-07 – 2021-03-01 (×43): 25 mg via ORAL
  Filled 2021-02-07 (×43): qty 1

## 2021-02-07 MED ORDER — LACTATED RINGERS IV BOLUS (SEPSIS)
500.0000 mL | Freq: Once | INTRAVENOUS | Status: AC
  Administered 2021-02-07: 500 mL via INTRAVENOUS

## 2021-02-07 MED ORDER — METOPROLOL SUCCINATE ER 25 MG PO TB24
12.5000 mg | ORAL_TABLET | Freq: Every day | ORAL | Status: DC
  Administered 2021-02-09 – 2021-02-27 (×14): 12.5 mg via ORAL
  Filled 2021-02-07 (×19): qty 1

## 2021-02-07 MED ORDER — NITROGLYCERIN 0.4 MG SL SUBL: 0.4000 mg | SUBLINGUAL_TABLET | SUBLINGUAL | Status: DC | PRN

## 2021-02-07 MED ORDER — ACETAMINOPHEN 325 MG PO TABS
650.0000 mg | ORAL_TABLET | Freq: Four times a day (QID) | ORAL | Status: DC | PRN
  Administered 2021-02-08 – 2021-02-14 (×4): 650 mg via ORAL
  Filled 2021-02-07 (×5): qty 2

## 2021-02-07 MED ORDER — HEPARIN SODIUM (PORCINE) 5000 UNIT/ML IJ SOLN: 5000.0000 [IU] | Freq: Three times a day (TID) | INTRAMUSCULAR | Status: DC

## 2021-02-07 MED ORDER — SODIUM CHLORIDE 0.9 % IV SOLN: 2.0000 g | INTRAVENOUS | Status: DC

## 2021-02-07 MED ORDER — SEVELAMER CARBONATE 800 MG PO TABS
1600.0000 mg | ORAL_TABLET | Freq: Three times a day (TID) | ORAL | Status: DC
  Administered 2021-02-07 – 2021-02-09 (×4): 1600 mg via ORAL
  Filled 2021-02-07 (×4): qty 2

## 2021-02-07 MED ORDER — CHLORHEXIDINE GLUCONATE CLOTH 2 % EX PADS: 6.0000 | MEDICATED_PAD | Freq: Every day | CUTANEOUS | Status: DC

## 2021-02-07 MED ORDER — DOCUSATE SODIUM 100 MG PO CAPS: 100.0000 mg | ORAL_CAPSULE | Freq: Two times a day (BID) | ORAL | Status: DC | PRN

## 2021-02-07 MED ORDER — FENTANYL 25 MCG/HR TD PT72
1.0000 | MEDICATED_PATCH | TRANSDERMAL | Status: DC
Start: 2021-02-07 — End: 2021-03-01
  Administered 2021-02-10 – 2021-02-28 (×7): 1 via TRANSDERMAL
  Filled 2021-02-07 (×8): qty 1

## 2021-02-07 MED ORDER — PANTOPRAZOLE SODIUM 40 MG PO TBEC
40.0000 mg | DELAYED_RELEASE_TABLET | Freq: Every day | ORAL | Status: DC
  Administered 2021-02-08 – 2021-03-01 (×22): 40 mg via ORAL
  Filled 2021-02-07 (×23): qty 1

## 2021-02-07 MED ORDER — FERRIC CITRATE 1 GM 210 MG(FE) PO TABS
210.0000 mg | ORAL_TABLET | Freq: Three times a day (TID) | ORAL | Status: DC
  Administered 2021-02-07 – 2021-02-09 (×4): 210 mg via ORAL
  Filled 2021-02-07 (×4): qty 1

## 2021-02-07 MED ORDER — DIGOXIN 125 MCG PO TABS: 0.0625 mg | ORAL_TABLET | ORAL | Status: DC

## 2021-02-07 MED ORDER — APIXABAN 5 MG PO TABS
5.0000 mg | ORAL_TABLET | Freq: Two times a day (BID) | ORAL | Status: DC
  Administered 2021-02-07 – 2021-02-14 (×14): 5 mg via ORAL
  Filled 2021-02-07 (×14): qty 1

## 2021-02-07 MED ORDER — PROSOURCE PLUS PO LIQD
30.0000 mL | Freq: Two times a day (BID) | ORAL | Status: DC
  Administered 2021-02-08 – 2021-03-01 (×34): 30 mL via ORAL
  Filled 2021-02-07 (×31): qty 30

## 2021-02-07 MED ORDER — VANCOMYCIN HCL 2000 MG/400ML IV SOLN
2000.0000 mg | Freq: Once | INTRAVENOUS | Status: AC
  Administered 2021-02-07: 2000 mg via INTRAVENOUS
  Filled 2021-02-07: qty 400

## 2021-02-07 MED ORDER — HYDROMORPHONE HCL 2 MG PO TABS
2.0000 mg | ORAL_TABLET | Freq: Three times a day (TID) | ORAL | Status: DC | PRN
  Administered 2021-02-07 – 2021-02-10 (×5): 2 mg via ORAL
  Filled 2021-02-07 (×5): qty 1

## 2021-02-07 MED ORDER — BASAGLAR KWIKPEN 100 UNIT/ML ~~LOC~~ SOPN: 15.0000 [IU] | PEN_INJECTOR | Freq: Every day | SUBCUTANEOUS | Status: DC

## 2021-02-07 MED ORDER — CHOLESTYRAMINE LIGHT 4 G PO PACK
4.0000 g | PACK | Freq: Two times a day (BID) | ORAL | Status: DC
  Administered 2021-02-07 – 2021-02-28 (×33): 4 g via ORAL
  Filled 2021-02-07 (×49): qty 1

## 2021-02-07 MED ORDER — CALCIUM ACETATE (PHOS BINDER) 667 MG PO CAPS
1334.0000 mg | ORAL_CAPSULE | Freq: Three times a day (TID) | ORAL | Status: DC
  Administered 2021-02-07 – 2021-02-09 (×4): 1334 mg via ORAL
  Filled 2021-02-07 (×4): qty 2

## 2021-02-07 MED ORDER — DARBEPOETIN ALFA 150 MCG/0.3ML IJ SOSY
150.0000 ug | PREFILLED_SYRINGE | Freq: Once | INTRAMUSCULAR | Status: AC
  Filled 2021-02-07: qty 0.3

## 2021-02-07 MED ORDER — LEVOTHYROXINE SODIUM 25 MCG PO TABS
25.0000 ug | ORAL_TABLET | Freq: Every day | ORAL | Status: DC
  Administered 2021-02-09 – 2021-02-19 (×10): 25 ug via ORAL
  Filled 2021-02-07 (×11): qty 1

## 2021-02-07 MED ORDER — POLYETHYLENE GLYCOL 3350 17 G PO PACK: 17.0000 g | PACK | Freq: Every day | ORAL | Status: DC | PRN

## 2021-02-07 MED ORDER — VANCOMYCIN HCL IN DEXTROSE 1-5 GM/200ML-% IV SOLN: 1000.0000 mg | INTRAVENOUS | Status: DC

## 2021-02-07 MED ORDER — COLLAGENASE 250 UNIT/GM EX OINT
TOPICAL_OINTMENT | Freq: Every day | CUTANEOUS | Status: AC
  Filled 2021-02-07 (×2): qty 30

## 2021-02-07 MED ORDER — CLONAZEPAM 0.125 MG PO TBDP
0.2500 mg | ORAL_TABLET | Freq: Two times a day (BID) | ORAL | Status: DC | PRN
  Administered 2021-02-21 – 2021-02-23 (×2): 0.25 mg via ORAL
  Filled 2021-02-07 (×2): qty 2

## 2021-02-07 MED ORDER — RENA-VITE PO TABS
1.0000 | ORAL_TABLET | Freq: Every day | ORAL | Status: DC
  Administered 2021-02-08 – 2021-03-01 (×21): 1 via ORAL
  Filled 2021-02-07 (×22): qty 1

## 2021-02-07 MED ORDER — INSULIN ASPART 100 UNIT/ML IJ SOLN
0.0000 [IU] | Freq: Every day | INTRAMUSCULAR | Status: DC
  Administered 2021-02-12: 2 [IU] via SUBCUTANEOUS
  Administered 2021-02-20 – 2021-02-21 (×2): 3 [IU] via SUBCUTANEOUS
  Administered 2021-02-27: 2 [IU] via SUBCUTANEOUS

## 2021-02-07 MED ORDER — LATANOPROST 0.005 % OP SOLN
1.0000 [drp] | Freq: Every day | OPHTHALMIC | Status: DC
  Administered 2021-02-07 – 2021-03-01 (×17): 1 [drp] via OPHTHALMIC
  Filled 2021-02-07 (×2): qty 2.5

## 2021-02-07 MED ORDER — ONDANSETRON HCL 4 MG/2ML IJ SOLN
4.0000 mg | Freq: Four times a day (QID) | INTRAMUSCULAR | Status: DC | PRN
  Administered 2021-02-13 – 2021-02-14 (×2): 4 mg via INTRAVENOUS
  Filled 2021-02-07 (×2): qty 2

## 2021-02-07 MED ORDER — CLOPIDOGREL BISULFATE 75 MG PO TABS
75.0000 mg | ORAL_TABLET | Freq: Every morning | ORAL | Status: DC
  Administered 2021-02-09 – 2021-02-14 (×6): 75 mg via ORAL
  Filled 2021-02-07 (×5): qty 1

## 2021-02-07 MED ORDER — SODIUM CHLORIDE 0.9 % IV SOLN
2.0000 g | Freq: Once | INTRAVENOUS | Status: AC
  Administered 2021-02-07: 2 g via INTRAVENOUS
  Filled 2021-02-07: qty 2

## 2021-02-07 MED ORDER — AMIODARONE HCL 200 MG PO TABS
200.0000 mg | ORAL_TABLET | Freq: Two times a day (BID) | ORAL | Status: DC
  Administered 2021-02-07 – 2021-02-27 (×39): 200 mg via ORAL
  Filled 2021-02-07 (×39): qty 1

## 2021-02-07 MED ORDER — ONDANSETRON HCL 4 MG PO TABS
4.0000 mg | ORAL_TABLET | Freq: Four times a day (QID) | ORAL | Status: DC | PRN
  Administered 2021-02-14 – 2021-02-15 (×3): 4 mg via ORAL
  Filled 2021-02-07 (×3): qty 1

## 2021-02-07 MED ORDER — ACETAMINOPHEN 650 MG RE SUPP: 650.0000 mg | Freq: Four times a day (QID) | RECTAL | Status: DC | PRN

## 2021-02-07 MED ORDER — INSULIN GLARGINE-YFGN 100 UNIT/ML ~~LOC~~ SOLN
15.0000 [IU] | Freq: Every day | SUBCUTANEOUS | Status: DC
  Administered 2021-02-08: 15 [IU] via SUBCUTANEOUS
  Filled 2021-02-07 (×2): qty 0.15

## 2021-02-07 MED ORDER — ATORVASTATIN CALCIUM 40 MG PO TABS
40.0000 mg | ORAL_TABLET | Freq: Every evening | ORAL | Status: DC
  Administered 2021-02-07 – 2021-03-01 (×22): 40 mg via ORAL
  Filled 2021-02-07 (×22): qty 1

## 2021-02-07 NOTE — ED Notes (Signed)
Received verbal report from [place]

## 2021-02-07 NOTE — Progress Notes (Signed)
Patient in xray, per RN MD will do UGPIV.

## 2021-02-07 NOTE — ED Notes (Signed)
Meal tray ordered 
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
## 2021-02-07 NOTE — Consult Note (Signed)
[place] Nurse wound consult note Consultation was completed by review of records, images and assistance from the bedside nurse/clinical staff.   Reason for Consult: Thigh wound Wound type: previous intramuscular thigh abscess 01/20/21; orthopedics performed excisional debridement and irrigation.    Pressure Injury POA: NA Measurement: see nursing flowsheet Wound bed:100% yellow non viable slough Drainage (amount, consistency, odor) unable to assess no dressing in image. However orthopedic PA notes purulent drainage  Periwound: mild induration and erythema  Dressing procedure/placement/frequency:  Enzymatic debridement ointment ordered daily, may need to return to the OR for additional debridements.  Saline moist topper and dry dressing. Change daily.   Re consult if needed, will not follow at this time. Thanks  Macyn Remmert [employer], RN,CWOCN, CNS, [place] [phone])

## 2021-02-07 NOTE — Consult Note (Addendum)
[facility] Renal Consultation Note    Indication for Consultation:  Management of ESRD/hemodialysis; anemia, hypertension/volume and secondary hyperparathyroidism PCP: No PCP  HPI: Kyle Walton is a 65 y.o. male with ESRD on hemodialysis MWF at [facility], [place], [place]. PMH: DMT2, chronic hypoxic hypercapnic respiratory failure, morbid obesity, CAD S/P CABG, Afib/Aflutter on Eliquis, Ischemic Cardiomyopathy, ICD, PAD S/P Multiple amputations, recent diagnosis of calciphylaxis. Recent admissions to [facility] 05/27-06/24/2022 for calciphylaxis, PAF. 07/11-07/22/2022 R thigh abscess. He has not missed any dialysis treatments since discharged from hospital 01/28/2021. Per CN at center, he has been getting to OP EDW 111 kgs.   He presented to ED today from SNF D/T worsening drainage from wound R thigh. He denies physical complaints-Fever, chills, pain, malaise but says wound was not properly cared for at [facility]. Wound R lateral thigh with purulent drainage, sutures intact, erythema and induration present, painful to touch. Labs unremarkable for HD patient, Lactic acid WNL. CXR unchanged from previous exam without acute findings. Blood cultures drawn, he has been started on Vanc & Cefepime per primary. He has been admitted for R thigh abscess. We have been asked to manage dialysis.    Past Medical History:  Diagnosis Date   Diabetes mellitus without complication (HCC)    Diastolic heart failure (HCC)    ESRD (end stage renal disease) (HCC)    MI (myocardial infarction) (HCC)    OSA (obstructive sleep apnea)    PAF (paroxysmal atrial fibrillation) ([place])    Renal disorder    on dialysis   Past Surgical History:  Procedure Laterality Date   CORONARY ARTERY BYPASS GRAFT  2017   LIMA LAD, SVG PDA, OM3   DIALYSIS FISTULA CREATION     I & D EXTREMITY Right 01/20/2021   Procedure: IRRIGATION AND DEBRIDEMENT OF THIGH, APPLICATION OF WOUND VAC;  Surgeon: Nicholes Stairs, MD;  Location: [place];   Service: Orthopedics;  Laterality: Right;   TOE AMPUTATION Bilateral    History reviewed. No pertinent family history. Social History:  reports that he has never smoked. He has never used smokeless tobacco. He reports previous alcohol use. He reports previous drug use. Allergies  Allergen Reactions   Morphine Other (See Comments)    Per son, [name], pt becomes unresponsive/disoriented- especially when given after HD tx AND "ALLERGIC," per MAR   Liraglutide Diarrhea and Other (See Comments)    "Phenol"...."ALLERGIC," per [facility]    Prior to Admission medications   Medication Sig Start Date End Date Taking? Authorizing Provider  Amino Acids-Protein Hydrolys (PRO-STAT) LIQD Take 30 mLs by mouth in the morning and at bedtime.   Yes [provider]  amiodarone (PACERONE) 200 MG tablet Take 1 tablet (200 mg total) by mouth 2 (two) times daily. 01/28/21  Yes Gherghe, Vella Redhead, MD  apixaban (ELIQUIS) 5 MG TABS tablet Take 1 tablet (5 mg total) by mouth 2 (two) times daily. 12/20/20 06/09/21 Yes Darliss Cheney, MD  atorvastatin (LIPITOR) 40 MG tablet Take 40 mg by mouth every evening. (2100) 09/23/20  Yes [provider]  B Complex-C-Folic Acid (RENAL-VITE) 0.8 MG TABS Take 1 tablet by mouth in the morning.   Yes [provider]  calcium acetate (PHOSLO) 667 MG capsule Take 1,334 mg by mouth 3 (three) times daily with meals.   Yes [provider]  cholestyramine light (PREVALITE) 4 g packet Take 1 packet (4 g total) by mouth every 12 (twelve) hours. 12/20/20 06/09/21 Yes Darliss Cheney, MD  clonazePAM (KLONOPIN) 0.5 MG  tablet Take 0.5 tablets (0.25 mg total) by mouth 2 (two) times daily as needed for anxiety. Patient taking differently: Take 0.25 mg by mouth every 12 (twelve) hours as needed for anxiety. 01/28/21  Yes Caren Griffins, MD  clopidogrel (PLAVIX) 75 MG tablet Take 75 mg by mouth in the morning. 10/13/20  Yes [provider]  digoxin (LANOXIN) 0.125 MG  tablet Take 0.0625 mg by mouth every other day.   Yes [provider]  docusate sodium (COLACE) 100 MG capsule Take 1 capsule (100 mg total) by mouth 2 (two) times daily as needed for mild constipation. Patient taking differently: Take 100 mg by mouth every 12 (twelve) hours as needed for mild constipation. 12/31/20  Yes Kathie Dike, MD  fentaNYL (DURAGESIC) 25 MCG/HR Place 1 patch onto the skin every 3 (three) days.   Yes [provider]  ferric citrate (AURYXIA) 1 GM 210 MG(Fe) tablet Take 210 mg by mouth 3 (three) times daily with meals.   Yes [provider]  HYDROmorphone (DILAUDID) 2 MG tablet Take 2 mg by mouth every 8 (eight) hours as needed for severe pain.   Yes [provider]  Insulin Glargine (BASAGLAR KWIKPEN) 100 UNIT/ML Inject 15 Units into the skin at bedtime. 12/20/20  Yes Pahwani, Einar Grad, MD  insulin lispro (HUMALOG) 100 UNIT/ML KwikPen Inject 2-12 Units into the skin See admin instructions. Inject 2-12 units into the skin three times a day before meals and at bedtime, per sliding scale: BGL 180-200 = 2 units; 201-250 = 5 units; 251-300 = 7 units; 301-400 = 12 units 08/23/20  Yes [provider]  latanoprost (XALATAN) 0.005 % ophthalmic solution Place 1 drop into both eyes at bedtime.   Yes [provider]  levothyroxine (SYNTHROID) 25 MCG tablet Take 25 mcg by mouth daily before breakfast.   Yes [provider]  metoprolol succinate (TOPROL-XL) 25 MG 24 hr tablet Take 0.5 tablets (12.5 mg total) by mouth daily. Patient taking differently: Take 25 mg by mouth daily. 12/31/20  Yes Kathie Dike, MD  midodrine (PROAMATINE) 10 MG tablet Take 1 tablet (10 mg total) by mouth 3 (three) times daily with meals. (0900, 1400 & 2100) Patient taking differently: Take 10 mg by mouth 3 (three) times daily. 01/28/21 02/27/21 Yes Gherghe, Vella Redhead, MD  mupirocin ointment (BACTROBAN) 2 % Apply 1 application topically See admin instructions.  Apply to scalp and arms every morning and at bedtime- for wound care 11/16/20  Yes [provider]  nitroGLYCERIN (NITROSTAT) 0.4 MG SL tablet Place 0.4 mg under the tongue every 5 (five) minutes x 3 doses as needed for chest pain.   Yes [provider]  OXYGEN Inhale 2 L/min into the lungs continuous.   Yes [provider]  pantoprazole (PROTONIX) 40 MG tablet Take 40 mg by mouth daily at 6 (six) AM. 10/13/20  Yes [provider]  polyethylene glycol (MIRALAX / GLYCOLAX) 17 g packet Take 17 g by mouth daily as needed for moderate constipation. 12/31/20  Yes Kathie Dike, MD  pregabalin (LYRICA) 25 MG capsule Take 25 mg by mouth 2 (two) times daily.   Yes [provider]  sevelamer carbonate (RENVELA) 800 MG tablet Take 2 tablets (1,600 mg total) by mouth 3 (three) times daily with meals. 12/30/20  Yes Kathie Dike, MD  digoxin 62.5 MCG TABS Take 0.0625 mg by mouth every other day. Patient not taking: Reported on 02/14/2021 01/01/21   Kathie Dike, MD  lidocaine (XYLOCAINE) 5 %  ointment Apply topically 3 (three) times daily. Patient not taking: Reported on 02/19/2021 12/30/20   Kathie Dike, MD  Oxycodone HCl 10 MG TABS Take 1 tablet (10 mg total) by mouth 2 (two) times daily as needed (severe pain). Patient not taking: Reported on 02/27/2021 01/28/21   Caren Griffins, MD   Current Facility-Administered Medications  Medication Dose Route Frequency Provider Last Rate Last Admin   acetaminophen (TYLENOL) tablet 650 mg  650 mg Oral Q6H PRN Darliss Cheney, MD       Or   acetaminophen (TYLENOL) suppository 650 mg  650 mg Rectal Q6H PRN Darliss Cheney, MD       amiodarone (PACERONE) tablet 200 mg  200 mg Oral BID Darliss Cheney, MD       apixaban (ELIQUIS) tablet 5 mg  5 mg Oral BID Darliss Cheney, MD       atorvastatin (LIPITOR) tablet 40 mg  40 mg Oral QPM Darliss Cheney, MD       Basaglar KwikPen KwikPen 15 Units  15 Units Subcutaneous QHS Darliss Cheney, MD        calcium acetate (PHOSLO) capsule 1,334 mg  1,334 mg Oral TID WC Darliss Cheney, MD       [START ON 02/09/2021] ceFEPIme (MAXIPIME) 2 g in sodium chloride 0.9 % 100 mL IVPB  2 g Intravenous Q M,W,F-2000 Rumbarger, Rachel L, RPH       cholestyramine light (PREVALITE) packet 4 g  4 g Oral Q12H Pahwani, Einar Grad, MD       clonazePAM (KLONOPIN) tablet 0.25 mg  0.25 mg Oral BID PRN Darliss Cheney, MD       Derrill Memo ON 02/08/2021] clopidogrel (PLAVIX) tablet 75 mg  75 mg Oral q AM Darliss Cheney, MD       collagenase (SANTYL) ointment   Topical Daily Sherwood Gambler, MD   Given at 02/14/2021 1422   [START ON 02/08/2021] digoxin (LANOXIN) tablet 0.0625 mg  0.0625 mg Oral Lequita Asal, MD       docusate sodium (COLACE) capsule 100 mg  100 mg Oral BID PRN Darliss Cheney, MD       fentaNYL (DURAGESIC) 25 MCG/HR 1 patch  1 patch Transdermal Q72H Darliss Cheney, MD       ferric citrate (AURYXIA) tablet 210 mg  210 mg Oral TID WC Pahwani, Einar Grad, MD       HYDROmorphone (DILAUDID) tablet 2 mg  2 mg Oral Q8H PRN Pahwani, Einar Grad, MD       insulin aspart (novoLOG) injection 0-5 Units  0-5 Units Subcutaneous QHS Pahwani, Ravi, MD       insulin aspart (novoLOG) injection 0-9 Units  0-9 Units Subcutaneous TID WC Pahwani, Ravi, MD       latanoprost (XALATAN) 0.005 % ophthalmic solution 1 drop  1 drop Both Eyes QHS Darliss Cheney, MD       [START ON 02/08/2021] levothyroxine (SYNTHROID) tablet 25 mcg  25 mcg Oral QAC breakfast Darliss Cheney, MD       metoprolol succinate (TOPROL-XL) 24 hr tablet 12.5 mg  12.5 mg Oral Daily Pahwani, Ravi, MD       midodrine (PROAMATINE) tablet 10 mg  10 mg Oral TID WC Pahwani, Ravi, MD       nitroGLYCERIN (NITROSTAT) SL tablet 0.4 mg  0.4 mg Sublingual Q5 Min x 3 PRN Pahwani, Einar Grad, MD       ondansetron (ZOFRAN) tablet 4 mg  4 mg Oral Q6H PRN Darliss Cheney, MD  Or   ondansetron (ZOFRAN) injection 4 mg  4 mg Intravenous Q6H PRN Darliss Cheney, MD       [START ON 02/08/2021] pantoprazole (PROTONIX)  EC tablet 40 mg  40 mg Oral Q0600 Pahwani, Einar Grad, MD       polyethylene glycol (MIRALAX / GLYCOLAX) packet 17 g  17 g Oral Daily PRN Darliss Cheney, MD       pregabalin (LYRICA) capsule 25 mg  25 mg Oral BID Darliss Cheney, MD       sevelamer carbonate (RENVELA) tablet 1,600 mg  1,600 mg Oral TID WC Darliss Cheney, MD       [START ON 02/09/2021] vancomycin (VANCOCIN) IVPB 1000 mg/200 mL premix  1,000 mg Intravenous Q M,W,F-HD Rumbarger, Valeda Malm, RPH       Current Outpatient Medications  Medication Sig Dispense Refill   Amino Acids-Protein Hydrolys (PRO-STAT) LIQD Take 30 mLs by mouth in the morning and at bedtime.     amiodarone (PACERONE) 200 MG tablet Take 1 tablet (200 mg total) by mouth 2 (two) times daily.     apixaban (ELIQUIS) 5 MG TABS tablet Take 1 tablet (5 mg total) by mouth 2 (two) times daily. 60 tablet 0   atorvastatin (LIPITOR) 40 MG tablet Take 40 mg by mouth every evening. (2100)     B Complex-C-Folic Acid (RENAL-VITE) 0.8 MG TABS Take 1 tablet by mouth in the morning.     calcium acetate (PHOSLO) 667 MG capsule Take 1,334 mg by mouth 3 (three) times daily with meals.     cholestyramine light (PREVALITE) 4 g packet Take 1 packet (4 g total) by mouth every 12 (twelve) hours. 60 packet 0   clonazePAM (KLONOPIN) 0.5 MG tablet Take 0.5 tablets (0.25 mg total) by mouth 2 (two) times daily as needed for anxiety. (Patient taking differently: Take 0.25 mg by mouth every 12 (twelve) hours as needed for anxiety.) 6 tablet 0   clopidogrel (PLAVIX) 75 MG tablet Take 75 mg by mouth in the morning.     digoxin (LANOXIN) 0.125 MG tablet Take 0.0625 mg by mouth every other day.     docusate sodium (COLACE) 100 MG capsule Take 1 capsule (100 mg total) by mouth 2 (two) times daily as needed for mild constipation. (Patient taking differently: Take 100 mg by mouth every 12 (twelve) hours as needed for mild constipation.) 10 capsule 0   fentaNYL (DURAGESIC) 25 MCG/HR Place 1 patch onto the skin every 3  (three) days.     ferric citrate (AURYXIA) 1 GM 210 MG(Fe) tablet Take 210 mg by mouth 3 (three) times daily with meals.     HYDROmorphone (DILAUDID) 2 MG tablet Take 2 mg by mouth every 8 (eight) hours as needed for severe pain.     Insulin Glargine (BASAGLAR KWIKPEN) 100 UNIT/ML Inject 15 Units into the skin at bedtime. 10 mL 0   insulin lispro (HUMALOG) 100 UNIT/ML KwikPen Inject 2-12 Units into the skin See admin instructions. Inject 2-12 units into the skin three times a day before meals and at bedtime, per sliding scale: BGL 180-200 = 2 units; 201-250 = 5 units; 251-300 = 7 units; 301-400 = 12 units     latanoprost (XALATAN) 0.005 % ophthalmic solution Place 1 drop into both eyes at bedtime.     levothyroxine (SYNTHROID) 25 MCG tablet Take 25 mcg by mouth daily before breakfast.     metoprolol succinate (TOPROL-XL) 25 MG 24 hr tablet Take 0.5 tablets (12.5 mg total) by mouth  daily. (Patient taking differently: Take 25 mg by mouth daily.)     midodrine (PROAMATINE) 10 MG tablet Take 1 tablet (10 mg total) by mouth 3 (three) times daily with meals. (0900, 1400 & 2100) (Patient taking differently: Take 10 mg by mouth 3 (three) times daily.) 90 tablet 0   mupirocin ointment (BACTROBAN) 2 % Apply 1 application topically See admin instructions. Apply to scalp and arms every morning and at bedtime- for wound care     nitroGLYCERIN (NITROSTAT) 0.4 MG SL tablet Place 0.4 mg under the tongue every 5 (five) minutes x 3 doses as needed for chest pain.     OXYGEN Inhale 2 L/min into the lungs continuous.     pantoprazole (PROTONIX) 40 MG tablet Take 40 mg by mouth daily at 6 (six) AM.     polyethylene glycol (MIRALAX / GLYCOLAX) 17 g packet Take 17 g by mouth daily as needed for moderate constipation. 14 each 0   pregabalin (LYRICA) 25 MG capsule Take 25 mg by mouth 2 (two) times daily.     sevelamer carbonate (RENVELA) 800 MG tablet Take 2 tablets (1,600 mg total) by mouth 3 (three) times daily with meals.      digoxin 62.5 MCG TABS Take 0.0625 mg by mouth every other day. (Patient not taking: Reported on 02/24/2021)     lidocaine (XYLOCAINE) 5 % ointment Apply topically 3 (three) times daily. (Patient not taking: Reported on 03/06/2021) 35.44 g 0   Oxycodone HCl 10 MG TABS Take 1 tablet (10 mg total) by mouth 2 (two) times daily as needed (severe pain). (Patient not taking: Reported on 02/28/2021) 10 tablet 0   Labs: Basic Metabolic Panel: Recent Labs  Lab 02/16/2021 1110 02/22/2021 1151 02/18/2021 1234 03/09/2021 1306  NA 142 143 144 143  K 3.7 3.6 3.4* 3.5  CL 105  --   --   --   CO2 20*  --   --   --   GLUCOSE 100*  --   --   --   BUN 29*  --   --   --   CREATININE 5.54*  --   --   --   CALCIUM 8.9  --   --   --    Liver Function Tests: Recent Labs  Lab 03/03/2021 1110  AST 49*  ALT 6  ALKPHOS 89  BILITOT 1.0  PROT 6.8  ALBUMIN 2.3*   No results for input(s): LIPASE, AMYLASE in the last 168 hours. No results for input(s): AMMONIA in the last 168 hours. CBC: Recent Labs  Lab 02/24/2021 1110 02/16/2021 1151 03/06/2021 1234 03/02/2021 1306  WBC 10.4  --   --   --   NEUTROABS 7.5  --   --   --   HGB 7.5* 14.3 8.5* 8.8*  HCT 25.4* 42.0 25.0* 26.0*  MCV 96.9  --   --   --   PLT 229  --   --   --    Cardiac Enzymes: No results for input(s): CKTOTAL, CKMB, CKMBINDEX, TROPONINI in the last 168 hours. CBG: Recent Labs  Lab 02/11/2021 1004  GLUCAP 106*   Iron Studies: No results for input(s): IRON, TIBC, TRANSFERRIN, FERRITIN in the last 72 hours. Studies/Results: CT FEMUR RIGHT WO CONTRAST  Result Date: 03/06/2021 CLINICAL DATA:  Right upper lateral thigh wound with drainage, altered mental status. EXAM: CT OF THE LOWER RIGHT EXTREMITY WITHOUT CONTRAST TECHNIQUE: Multidetector CT imaging of the right lower extremity was performed according to the  standard protocol. COMPARISON:  12/05/2020 FINDINGS: Bones/Joint/Cartilage Considerable articular space narrowing in the right hip. No fracture or  destructive bony findings. 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 Trace knee effusion. No definite hip joint effusion. Ligaments Suboptimally assessed by CT. Muscles and Tendons Worsened thickening along the superficial fascia margin of the right anterior muscular compartment laterally with some reduction in distinctness of fat planes in the vastus lateralis muscle which could indicate and are lying myositis. Soft tissues Subcutaneous edema laterally in the upper thigh and circumferentially in the mid and distal thigh and knee region. Cutaneous wound along the anterolateral upper thigh with locules of gas tracking within a band of subcutaneous density over an approximately 13.8 cm excursion for example as shown on images 19 through 30 of series 10. Substantially improved appearance of the large mixed density collection along the vastus lateralis compared to prior, notably reduced in size and indistinctly marginated. Pelvic ascites noted. IMPRESSION: 1. Substantially reduced volume of the mixed density collection within along the right vastus lateralis compared to the 01/20/2021 exam. Locules of subcutaneous gas density track along an approximately 13 cm vertical excursion. 2. Inflammatory stranding extends down to the superficial fascia margin and there is some mild effacement of fat planes in the vastus lateralis compared to prior raising the possibility of early myositis. I am skeptical that this is leading to an overt compartment syndrome at this time given the persistence of some of the fat planes, but worsening could increase risk of compartment syndrome. 3. Trace knee effusion. 4. Degenerative loss of articular space in the right hip joint. 5. Circumferential edema around the knee and distal thigh with lateral edema along the proximal thigh, suspicious for cellulitis. 6. Pelvic ascites. Electronically Signed   By: Van Clines M.D.   On:  02/15/2021 11:08   DG Chest Port 1 View  Result Date: 02/21/2021 CLINICAL DATA:  Questionable sepsis. Right leg infection for 5 weeks EXAM: PORTABLE CHEST 1 VIEW COMPARISON:  01/20/2021 FINDINGS: Low volume chest with interstitial coarsening that is stable and compatible with scarring based on March 2022 chest CT. Perma catheter on the left with tip at the lower right atrium. Dual-chamber pacer leads from the right in stable position. Prior CABG. Extensive artifact from EKG leads. IMPRESSION: Chronic low volume chest with scarring. No acute finding when compared to priors. Electronically Signed   By: Monte Fantasia M.D.   On: 02/14/2021 09:45    ROS: As per HPI otherwise negative.   Physical Exam: Vitals:   02/27/2021 1300 02/09/2021 1315 02/22/2021 1353 03/02/2021 1415  BP: 92/60 104/61 (!) 106/55 102/60  Pulse: 78 81 77 75  Resp: '16 18 14 17  '$ Temp:      TempSrc:      SpO2: 100% 98% 97% 99%  Weight:      Height:         General: Chronically ill appearing male in NAD Head: Normocephalic, healing wounds on top of forehead, sclera non-icteric, mucus membranes are moist Neck: Supple. JVD not elevated. Lungs: Clear bilaterally to auscultation without wheezes, rales, or rhonchi. Breathing is unlabored. Heart: Irreg, irreg with S1 S2. No murmurs, rubs, or gallops appreciated. Afib on monitor. Rate in 70s.  Abdomen: Soft, non-tender, non-distended with normoactive bowel sounds. No rebound/guarding. No obvious abdominal masses. Edema present lower abdomen.  Lower extremities: 1-2+ pitting edema BLE. Toe amps both feet. Has Wound R lateral thigh with purulent drainage, sutures intact, erythema and induration present.  Neuro: Alert and oriented X 3. Moves all extremities spontaneously. Psych:  Responds to questions appropriately with a normal affect. Dialysis Access: LIJ TDC Drsg intact  Dialysis Orders: Center: [facility] MWF 4 hrs F250 450/800 3.0K/2.5 Ca LIJ TDC  EDW 111 -Heparin 4900 units IV initial  bolus, 500 units hourly, DC last hour of tx  Assessment/Plan:  R. Thigh Abscess: ABX started. Per primary- maxipime and vanc  ESRD - MWF. Will have HD in AM D/T staffing issues. K+3.7.   Hypertension/volume  -BP is soft with evidence of volume excess by exam. Agrees to attempt to lower volume as tolerated. Continue midodrine as previously ordered. Likely that his true EDW is lower  Anemia  -HGB 7.5 Getting orders for ESA from [facility].Start Aranesp with HD tomorrow. Hold Fe D/T infection.   Metabolic bone disease -  Awaiting orders from [facility]. C Ca high, suspect VDRA is on hold. Continue binders- renvela and auryxia both ordered  Nutrition - Albumin low. Renal/Carb mod diet with protein supps, renal vits. DMT2-per primary Hypothyroidism-per primary H/O PAF: Was scheduled for cardioversion but this has not happened yet. Patient requests that cardiology be made aware of admission. Continue apixaban, low dose metoprolol, digoxin.    Rita H. Owens Shark, NP-C 03/05/2021, 2:26 PM  [employer] [phone]  Patient seen and examined, agree with above note with above modifications.  Chronically ill with medically complex last couple of months-  admitted now for worsening of thigh abscess-  purulent drainage-  main issues from our standpoint are volume overload and anemia-  will challenge EDW as able using midodrine and try to amximize his ESA-  no iron due to infection  Corliss Parish, MD 02/28/2021

## 2021-02-07 NOTE — ED Notes (Signed)
Pt asking for more pain medication, but had to be woken up twice while giving medications and blood pressure is running low.

## 2021-02-07 NOTE — ED Notes (Signed)
[facility] at bedside.

## 2021-02-07 NOTE — Progress Notes (Signed)
Pharmacy Antibiotic Note  Kyle Walton is a 65 y.o. male admitted on 02/15/2021 with sepsis.  Pharmacy has been consulted for vancomycin and cefepime dosing. Pt is afebrile and other labs are pending. Pt with history of ESRD on HD. Recently grew out serratia from leg wound. S/p I&D 7/14 and discharged 7/22 with PO ciprofloxacin.   Plan: Vancomycin 2gm IV x 1 then 1gm QHD Cefepime 2gm IV x 1 then 2g QHD F/u renal fxn, C&S, clinical status and pre-HD vanc levels PRN  Height: '5\' 10"'$  (177.8 cm) Weight: 110.2 kg (243 lb) IBW/kg (Calculated) : 73  Temp (24hrs), Avg:97.5 F (36.4 C), Min:97.5 F (36.4 C), Max:97.5 F (36.4 C)  No results for input(s): WBC, CREATININE, LATICACIDVEN, VANCOTROUGH, VANCOPEAK, VANCORANDOM, GENTTROUGH, GENTPEAK, GENTRANDOM, TOBRATROUGH, TOBRAPEAK, TOBRARND, AMIKACINPEAK, AMIKACINTROU, AMIKACIN in the last 168 hours.  Estimated Creatinine Clearance: 17.2 mL/min (A) (by C-G formula based on SCr of 5.31 mg/dL (H)).    Allergies  Allergen Reactions   Morphine Other (See Comments)    Per son, Kyle Walton, pt becomes unresponsive/disoriented. Especially when given after HD tx   Liraglutide Diarrhea    Phenol Phenol     Antimicrobials this admission: Vanc 8/1>> Cefepime 8/1>>  Dose adjustments this admission: N/A  Microbiology results: Pending  Thank you for allowing pharmacy to be a part of this patient's care.  Anneliese Leblond, Rande Lawman 02/13/2021 9:36 AM

## 2021-02-07 NOTE — ED Notes (Signed)
CBG: 115 

## 2021-02-07 NOTE — ED Notes (Signed)
2 RNs attempted IV with no succes. Consult for IV team placed.

## 2021-02-07 NOTE — Consult Note (Signed)
Reason for Consult:Right thigh wound Referring Physician: Sherwood Gambler Time called: [id number] Time at bedside: [place] is an 65 y.o. male.  HPI: Kyle Walton underwent I&D of a right thigh abscess about 2 weeks ago. He was discharged to a SNF on 7/22. For some reason his abx were not continued at the SNF. He did ok for a few days but began to have increased pain and swelling about 4d ago. He also began to have increased drainage with a change in color from clear to brown. He denies known fevers, chills, sweats, N/V.  Past Medical History:  Diagnosis Date   Diabetes mellitus without complication (HCC)    Diastolic heart failure (HCC)    ESRD (end stage renal disease) (HCC)    MI (myocardial infarction) (HCC)    OSA (obstructive sleep apnea)    PAF (paroxysmal atrial fibrillation) ([place])    Renal disorder    on dialysis    Past Surgical History:  Procedure Laterality Date   CORONARY ARTERY BYPASS GRAFT  2017   LIMA LAD, SVG PDA, OM3   DIALYSIS FISTULA CREATION     I & D EXTREMITY Right 01/20/2021   Procedure: IRRIGATION AND DEBRIDEMENT OF THIGH, APPLICATION OF WOUND VAC;  Surgeon: Nicholes Stairs, MD;  Location: [facility];  Service: Orthopedics;  Laterality: Right;   TOE AMPUTATION Bilateral     History reviewed. No pertinent family history.  Social History:  reports that he has never smoked. He has never used smokeless tobacco. He reports previous alcohol use. He reports previous drug use.  Allergies:  Allergies  Allergen Reactions   Morphine Other (See Comments)    Per son, [name], pt becomes unresponsive/disoriented. Especially when given after HD tx   Liraglutide Diarrhea    Phenol Phenol     Medications: I have reviewed the patient's current medications.  Results for orders placed or performed during the hospital encounter of 03/08/2021 (from the past 48 hour(s))  Resp Panel by RT-PCR (Flu A&B, Covid) Nasopharyngeal Swab     Status: None   Collection Time:  02/16/2021  9:55 AM   Specimen: Nasopharyngeal Swab; Nasopharyngeal(NP) swabs in vial transport medium  Result Value Ref Range   SARS Coronavirus 2 by RT PCR NEGATIVE NEGATIVE    Comment: (NOTE) SARS-CoV-2 target nucleic acids are NOT DETECTED.  The SARS-CoV-2 RNA is generally detectable in upper respiratory specimens during the acute phase of infection. The lowest concentration of SARS-CoV-2 viral copies this assay can detect is 138 copies/mL. A negative result does not preclude SARS-Cov-2 infection and should not be used as the sole basis for treatment or other patient management decisions. A negative result may occur with  improper specimen collection/handling, submission of specimen other than nasopharyngeal swab, presence of viral mutation(s) within the areas targeted by this assay, and inadequate number of viral copies(<138 copies/mL). A negative result must be combined with clinical observations, patient history, and epidemiological information. The expected result is Negative.  Fact Sheet for Patients:  EntrepreneurPulse.com.au  Fact Sheet for Healthcare Providers:  IncredibleEmployment.be  This test is no t yet approved or cleared by the [place] FDA and  has been authorized for detection and/or diagnosis of SARS-CoV-2 by FDA under an Emergency Use Authorization (EUA). This EUA will remain  in effect (meaning this test can be used) for the duration of the COVID-19 declaration under Section 564(b)(1) of the Act, 21 U.S.C.section 360bbb-3(b)(1), unless the authorization is terminated  or revoked sooner.  Influenza A by PCR NEGATIVE NEGATIVE   Influenza B by PCR NEGATIVE NEGATIVE    Comment: (NOTE) The Xpert Xpress SARS-CoV-2/FLU/RSV plus assay is intended as an aid in the diagnosis of influenza from Nasopharyngeal swab specimens and should not be used as a sole basis for treatment. Nasal washings and aspirates are  unacceptable for Xpert Xpress SARS-CoV-2/FLU/RSV testing.  Fact Sheet for Patients: EntrepreneurPulse.com.au  Fact Sheet for Healthcare Providers: IncredibleEmployment.be  This test is not yet approved or cleared by the [place] FDA and has been authorized for detection and/or diagnosis of SARS-CoV-2 by FDA under an Emergency Use Authorization (EUA). This EUA will remain in effect (meaning this test can be used) for the duration of the COVID-19 declaration under Section 564(b)(1) of the Act, 21 U.S.C. section 360bbb-3(b)(1), unless the authorization is terminated or revoked.  Performed at [facility], [place] [address] [place], [place] [address]   CBG monitoring, ED     Status: Abnormal   Collection Time: 02/18/2021 10:04 AM  Result Value Ref Range   Glucose-Capillary 106 (H) 70 - 99 mg/dL    Comment: Glucose reference range applies only to samples taken after fasting for at least 8 hours.  I-Stat venous blood gas, ED     Status: Abnormal   Collection Time: 02/09/2021 11:51 AM  Result Value Ref Range   pH, Ven 7.192 (LL) 7.250 - 7.430   pCO2, Ven 56.6 44.0 - 60.0 mmHg   pO2, Ven 93.0 (H) 32.0 - 45.0 mmHg   Bicarbonate 21.7 20.0 - 28.0 mmol/L   TCO2 23 22 - 32 mmol/L   O2 Saturation 95.0 %   Acid-base deficit 7.0 (H) 0.0 - 2.0 mmol/L   Sodium 143 135 - 145 mmol/L   Potassium 3.6 3.5 - 5.1 mmol/L   Calcium, Ion 1.18 1.15 - 1.40 mmol/L   HCT 42.0 39.0 - 52.0 %   Hemoglobin 14.3 13.0 - 17.0 g/dL   Sample type VENOUS    Comment NOTIFIED PHYSICIAN     CT FEMUR RIGHT WO CONTRAST  Result Date: 02/19/2021 CLINICAL DATA:  Right upper lateral thigh wound with drainage, altered mental status. EXAM: CT OF THE LOWER RIGHT EXTREMITY WITHOUT CONTRAST TECHNIQUE: Multidetector CT imaging of the right lower extremity was performed according to the standard protocol. COMPARISON:  12/05/2020 FINDINGS: Bones/Joint/Cartilage Considerable articular space  narrowing in the right hip. No fracture or destructive bony findings. 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 Trace knee effusion. No definite hip joint effusion. Ligaments Suboptimally assessed by CT. Muscles and Tendons Worsened thickening along the superficial fascia margin of the right anterior muscular compartment laterally with some reduction in distinctness of fat planes in the vastus lateralis muscle which could indicate and are lying myositis. Soft tissues Subcutaneous edema laterally in the upper thigh and circumferentially in the mid and distal thigh and knee region. Cutaneous wound along the anterolateral upper thigh with locules of gas tracking within a band of subcutaneous density over an approximately 13.8 cm excursion for example as shown on images 19 through 30 of series 10. Substantially improved appearance of the large mixed density collection along the vastus lateralis compared to prior, notably reduced in size and indistinctly marginated. Pelvic ascites noted. IMPRESSION: 1. Substantially reduced volume of the mixed density collection within along the right vastus lateralis compared to the 01/20/2021 exam. Locules of subcutaneous gas density track along an approximately 13 cm vertical excursion. 2. Inflammatory stranding extends down to the superficial  fascia margin and there is some mild effacement of fat planes in the vastus lateralis compared to prior raising the possibility of early myositis. I am skeptical that this is leading to an overt compartment syndrome at this time given the persistence of some of the fat planes, but worsening could increase risk of compartment syndrome. 3. Trace knee effusion. 4. Degenerative loss of articular space in the right hip joint. 5. Circumferential edema around the knee and distal thigh with lateral edema along the proximal thigh, suspicious for cellulitis. 6. Pelvic ascites. Electronically  Signed   By: Van Clines M.D.   On: 02/16/2021 11:08   DG Chest Port 1 View  Result Date: 02/12/2021 CLINICAL DATA:  Questionable sepsis. Right leg infection for 5 weeks EXAM: PORTABLE CHEST 1 VIEW COMPARISON:  01/20/2021 FINDINGS: Low volume chest with interstitial coarsening that is stable and compatible with scarring based on March 2022 chest CT. Perma catheter on the left with tip at the lower right atrium. Dual-chamber pacer leads from the right in stable position. Prior CABG. Extensive artifact from EKG leads. IMPRESSION: Chronic low volume chest with scarring. No acute finding when compared to priors. Electronically Signed   By: Monte Fantasia M.D.   On: 03/07/2021 09:45    Review of Systems  Constitutional:  Negative for chills, diaphoresis and fever.  HENT:  Negative for ear discharge, ear pain, hearing loss and tinnitus.   Eyes:  Negative for photophobia and pain.  Respiratory:  Negative for cough and shortness of breath.   Cardiovascular:  Negative for chest pain.  Gastrointestinal:  Negative for abdominal pain, nausea and vomiting.  Genitourinary:  Negative for dysuria, flank pain, frequency and urgency.  Musculoskeletal:  Positive for myalgias (Right thigh). Negative for back pain and neck pain.  Skin:  Positive for wound.  Neurological:  Negative for dizziness and headaches.  Hematological:  Does not bruise/bleed easily.  Psychiatric/Behavioral:  The patient is not nervous/anxious.   Blood pressure (!) 102/58, pulse 79, temperature (!) 97.5 F (36.4 C), temperature source Oral, resp. rate 16, height '5\' 10"'$  (1.778 m), weight 110.2 kg, SpO2 99 %. Physical Exam Constitutional:      General: He is not in acute distress.    Appearance: He is well-developed. He is not diaphoretic.  HENT:     Head: Normocephalic and atraumatic.  Eyes:     General: No scleral icterus.       Right eye: No discharge.        Left eye: No discharge.     Conjunctiva/sclera: Conjunctivae normal.   Cardiovascular:     Rate and Rhythm: Normal rate and regular rhythm.  Pulmonary:     Effort: Pulmonary effort is normal. No respiratory distress.  Musculoskeletal:     Cervical back: Normal range of motion.     Comments: RLE Surgical incision lateral thigh, purulent discharge, sutures in place, no ecchymosis or rash. Some induration but compartments soft. Mod TTP.  No knee or ankle effusion  Knee stable to varus/ valgus and anterior/posterior stress  Sens DPN, SPN, TN intact  Motor EHL, ext, flex, evers 5/5  DP 2+, PT 0, No significant edema  Skin:    General: Skin is warm and dry.  Neurological:     Mental Status: He is alert.  Psychiatric:        Mood and Affect: Mood normal.        Behavior: Behavior normal.    Assessment/Plan: Right thigh infection -- Would see if wound  will turn around with IV abx treatment in next 48h. If not will need to return to OR for repeat I&D. Will remove sutures to facilitate wound drainage. WOC consult.    Lisette Abu, PA-C Orthopedic Surgery [phone] 02/22/2021, 12:10 PM

## 2021-02-07 NOTE — ED Triage Notes (Addendum)
Pt arrives via EMS from [facility] for wound check to right upper lateral thigh. Pts nurse states that he has been having increased drainage and also mention a change in mental status. Wound complications x 6 weeks. Pt axox4 at this time. Received 2 mg PO dilaudid prior to leaving facility.

## 2021-02-07 NOTE — ED Notes (Signed)
Pt given drink 

## 2021-02-07 NOTE — ED Notes (Signed)
ED Provider at bedside. 

## 2021-02-07 NOTE — ED Provider Notes (Signed)
[facility] EMERGENCY DEPARTMENT Provider Note   CSN: [id number] Arrival date & time: 02/13/2021  0900     History Chief Complaint  Patient presents with   Wound Check    Kyle Walton is a 65 y.o. male.  HPI 65 year old male presents with worsening right leg wound.  He states that he has been having worsening pain and swelling since arriving to the rehab facility about a week ago.  He has been dealing with an infection to his right leg that turned into an intramuscular abscess that required OR drainage on 7/14.  Since then he has been in rehab.  He states he is not sure if he is actually receiving the antibiotics.  Nurse reports EMS noted that a wound care nurse saw his wound today and it was draining more and concerned it was worse.  Patient denies any fevers.  He was apparently given p.o. Dilaudid prior to arriving here.  He states he feels sleepy but otherwise is not altered.  Past Medical History:  Diagnosis Date   Diabetes mellitus without complication (HCC)    Diastolic heart failure (HCC)    ESRD (end stage renal disease) (HCC)    MI (myocardial infarction) ([place])    OSA (obstructive sleep apnea)    PAF (paroxysmal atrial fibrillation) ([place])    Renal disorder    on dialysis    Patient Active Problem List   Diagnosis Date Noted   Open thigh wound, right, sequela 02/23/2021   Anemia of renal disease 01/23/2021   Acute on chronic diastolic (congestive) heart failure ([place]) 01/18/2021   Acute encephalopathy 01/18/2021   High anion gap metabolic acidosis [id number]   Hypotension 01/18/2021   Acquired hypothyroidism 01/18/2021   Dry gangrene ([place]) 01/18/2021   Sepsis, unspecified organism ([place]) 01/18/2021   Pressure injury of skin 12/11/2020   Cellulitis of right leg 12/03/2020   Severe sepsis ([place]) 12/03/2020   Aggressive behavior    CAD (coronary artery disease) 10/27/2020   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   Cardiopulmonary arrest with successful resuscitation  ([place]) 10/27/2020   Diabetes mellitus with peripheral vascular disease ([place]) 10/27/2020   History of DVT (deep vein thrombosis) 10/27/2020   OSA (obstructive sleep apnea) 10/27/2020   Paroxysmal atrial fibrillation ([place]) 10/27/2020   Secondary hyperparathyroidism, renal ([place]) 10/27/2020   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 ([place]) 10/27/2020   Chronic diastolic congestive heart failure, NYHA class 4 ([place]) 10/27/2020   ESRD on hemodialysis ([place]) 10/27/2020   Impaired physical mobility 10/27/2020   Acute on chronic respiratory failure with hypoxia and hypercapnia ([place]) 10/27/2020   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 No pertinent family history.  Social History   Tobacco Use   Smoking status: Never   Smokeless tobacco: Never  Vaping Use   Vaping Use: Never used  Substance Use Topics   Alcohol use: Not Currently  Drug use: Not Currently    Home Medications Prior to Admission medications   Medication Sig Start Date End Date Taking? Authorizing Provider  Amino Acids-Protein Hydrolys (PRO-STAT) LIQD Take 30 mLs by mouth in the morning and at bedtime.   Yes [provider]  amiodarone (PACERONE) 200 MG tablet Take 1 tablet (200 mg total) by mouth 2 (two) times daily. 01/28/21  Yes Gherghe, Vella Redhead, MD  apixaban (ELIQUIS) 5 MG TABS tablet Take 1 tablet (5 mg total) by mouth 2 (two) times daily. 12/20/20 06/09/21 Yes  Darliss Cheney, MD  atorvastatin (LIPITOR) 40 MG tablet Take 40 mg by mouth every evening. (2100) 09/23/20  Yes [provider]  B Complex-C-Folic Acid (RENAL-VITE) 0.8 MG TABS Take 1 tablet by mouth in the morning.   Yes [provider]  calcium acetate (PHOSLO) 667 MG capsule Take 1,334 mg by mouth 3 (three) times daily with meals.   Yes [provider]  cholestyramine light (PREVALITE) 4 g packet Take 1 packet (4 g total) by mouth every 12 (twelve) hours. 12/20/20 06/09/21 Yes Pahwani, Einar Grad, MD  clonazePAM (KLONOPIN) 0.5 MG tablet Take 0.5 tablets (0.25 mg total) by mouth 2 (two) times daily as needed for anxiety. Patient taking differently: Take 0.25 mg by mouth every 12 (twelve) hours as needed for anxiety. 01/28/21  Yes Caren Griffins, MD  clopidogrel (PLAVIX) 75 MG tablet Take 75 mg by mouth in the morning. 10/13/20  Yes [provider]  digoxin (LANOXIN) 0.125 MG tablet Take 0.0625 mg by mouth every other day.   Yes [provider]  docusate sodium (COLACE) 100 MG capsule Take 1 capsule (100 mg total) by mouth 2 (two) times daily as needed for mild constipation. Patient taking differently: Take 100 mg by mouth every 12 (twelve) hours as needed for mild constipation. 12/31/20  Yes Kathie Dike, MD  fentaNYL (DURAGESIC) 25 MCG/HR Place 1 patch onto the skin every 3 (three) days.   Yes [provider]  ferric citrate (AURYXIA) 1 GM 210 MG(Fe) tablet Take 210 mg by mouth 3 (three) times daily with meals.   Yes [provider]  HYDROmorphone (DILAUDID) 2 MG tablet Take 2 mg by mouth every 8 (eight) hours as needed for severe pain.   Yes [provider]  Insulin Glargine (BASAGLAR KWIKPEN) 100 UNIT/ML Inject 15 Units into the skin at bedtime. 12/20/20  Yes Pahwani, Einar Grad, MD  insulin lispro (HUMALOG) 100 UNIT/ML KwikPen Inject 2-12 Units into the skin See admin instructions. Inject 2-12 units into the skin three times a day before  meals and at bedtime, per sliding scale: BGL 180-200 = 2 units; 201-250 = 5 units; 251-300 = 7 units; 301-400 = 12 units 08/23/20  Yes [provider]  latanoprost (XALATAN) 0.005 % ophthalmic solution Place 1 drop into both eyes at bedtime.   Yes [provider]  levothyroxine (SYNTHROID) 25 MCG tablet Take 25 mcg by mouth daily before breakfast.   Yes [provider]  metoprolol succinate (TOPROL-XL) 25 MG 24 hr tablet Take 0.5 tablets (12.5 mg total) by mouth daily. Patient taking differently: Take 25 mg by mouth daily. 12/31/20  Yes Kathie Dike, MD  midodrine (PROAMATINE) 10 MG tablet Take 1 tablet (10 mg total) by mouth 3 (three) times daily with meals. (0900, 1400 & 2100) Patient taking differently: Take 10 mg by mouth 3 (three) times daily. 01/28/21 02/27/21 Yes Gherghe, Vella Redhead, MD  mupirocin ointment (BACTROBAN) 2 % Apply 1 application topically  See admin instructions. Apply to scalp and arms every morning and at bedtime- for wound care 11/16/20  Yes [provider]  nitroGLYCERIN (NITROSTAT) 0.4 MG SL tablet Place 0.4 mg under the tongue every 5 (five) minutes x 3 doses as needed for chest pain.   Yes [provider]  OXYGEN Inhale 2 L/min into the lungs continuous.   Yes [provider]  pantoprazole (PROTONIX) 40 MG tablet Take 40 mg by mouth daily at 6 (six) AM. 10/13/20  Yes [provider]  polyethylene glycol (MIRALAX / GLYCOLAX) 17 g packet Take 17 g by mouth daily as needed for moderate constipation. 12/31/20  Yes Kathie Dike, MD  pregabalin (LYRICA) 25 MG capsule Take 25 mg by mouth 2 (two) times daily.   Yes [provider]  sevelamer carbonate (RENVELA) 800 MG tablet Take 2 tablets (1,600 mg total) by mouth 3 (three) times daily with meals. 12/30/20  Yes Kathie Dike, MD  digoxin 62.5 MCG TABS Take 0.0625 mg by mouth every other day. Patient not taking: Reported on 02/12/2021 01/01/21   Kathie Dike, MD   lidocaine (XYLOCAINE) 5 % ointment Apply topically 3 (three) times daily. Patient not taking: Reported on 02/24/2021 12/30/20   Kathie Dike, MD  Oxycodone HCl 10 MG TABS Take 1 tablet (10 mg total) by mouth 2 (two) times daily as needed (severe pain). Patient not taking: Reported on 03/06/2021 01/28/21   Caren Griffins, MD    Allergies    Morphine and Liraglutide  Review of Systems   Review of Systems  Constitutional:  Negative for fever.  Cardiovascular:  Positive for leg swelling.  Gastrointestinal:  Negative for vomiting.  Musculoskeletal:  Positive for myalgias.  Skin:  Positive for wound.  All other systems reviewed and are negative.  Physical Exam Updated Vital Signs BP 101/74   Pulse 80   Temp (!) 97.5 F (36.4 C) (Oral)   Resp 15   Ht '5\' 10"'$  (1.778 m)   Wt 110.2 kg   SpO2 97%   BMI 34.87 kg/m   Physical Exam Vitals and nursing note reviewed.  Constitutional:      Appearance: He is well-developed.  HENT:     Head: Normocephalic and atraumatic.     Right Ear: External ear normal.     Left Ear: External ear normal.     Nose: Nose normal.  Eyes:     General:        Right eye: No discharge.        Left eye: No discharge.  Cardiovascular:     Rate and Rhythm: Normal rate and regular rhythm.     Pulses:          Dorsalis pedis pulses are detected w/ Doppler on the right side.  Pulmonary:     Effort: Pulmonary effort is normal.  Abdominal:     General: There is no distension.  Musculoskeletal:     Cervical back: Neck supple.     Comments: Tenderness and swelling to right thigh. There is purulence that can be expressed. See picture  Skin:    General: Skin is warm and dry.     Findings: Erythema present.  Neurological:     Mental Status: He is alert and oriented to person, place, and time.  Psychiatric:        Mood and Affect: Mood is not anxious.      ED Results / Procedures / Treatments   Labs (all labs ordered are listed, but  only abnormal  results are displayed) Labs Reviewed  COMPREHENSIVE METABOLIC PANEL - Abnormal; Notable for the following components:      Result Value   CO2 20 (*)    Glucose, Bld 100 (*)    BUN 29 (*)    Creatinine, Ser 5.54 (*)    Albumin 2.3 (*)    AST 49 (*)    GFR, Estimated 11 (*)    Anion gap 17 (*)    All other components within normal limits  CBC WITH DIFFERENTIAL/PLATELET - Abnormal; Notable for the following components:   RBC 2.62 (*)    Hemoglobin 7.5 (*)    HCT 25.4 (*)    MCHC 29.5 (*)    RDW 22.5 (*)    nRBC 0.3 (*)    Monocytes Absolute 1.1 (*)    Abs Immature Granulocytes 0.08 (*)    All other components within normal limits  PROTIME-INR - Abnormal; Notable for the following components:   Prothrombin Time 21.5 (*)    INR 1.9 (*)    All other components within normal limits  APTT - Abnormal; Notable for the following components:   aPTT 43 (*)    All other components within normal limits  HEMOGLOBIN A1C - Abnormal; Notable for the following components:   Hgb A1c MFr Bld 7.2 (*)    All other components within normal limits  I-STAT VENOUS BLOOD GAS, ED - Abnormal; Notable for the following components:   pH, Ven 7.192 (*)    pO2, Ven 93.0 (*)    Acid-base deficit 7.0 (*)    All other components within normal limits  CBG MONITORING, ED - Abnormal; Notable for the following components:   Glucose-Capillary 106 (*)    All other components within normal limits  I-STAT ARTERIAL BLOOD GAS, ED - Abnormal; Notable for the following components:   pH, Arterial 7.271 (*)    pO2, Arterial 37 (*)    Acid-base deficit 6.0 (*)    Potassium 3.4 (*)    HCT 25.0 (*)    Hemoglobin 8.5 (*)    All other components within normal limits  I-STAT ARTERIAL BLOOD GAS, ED - Abnormal; Notable for the following components:   pH, Arterial 7.257 (*)    pO2, Arterial 78 (*)    Acid-base deficit 6.0 (*)    HCT 26.0 (*)    Hemoglobin 8.8 (*)    All other components within normal limits  RESP PANEL  BY RT-PCR (FLU A&B, COVID) ARPGX2  CULTURE, BLOOD (ROUTINE X 2)  CULTURE, BLOOD (ROUTINE X 2)  LACTIC ACID, PLASMA  LACTIC ACID, PLASMA  MAGNESIUM    EKG EKG Interpretation  Date/Time:  Monday February 07 2021 09:16:50 EDT Ventricular Rate:  82 PR Interval:    QRS Duration: 188 QT Interval:  460 QTC Calculation: 538 R Axis:   -15 Text Interpretation: regular rhythm, likely sinus IVCD, consider atypical LBBB similar to January 17 2021 Confirmed by Sherwood Gambler [phone]) on 03/08/2021 9:52:38 AM  Radiology CT FEMUR RIGHT WO CONTRAST  Result Date: 02/09/2021 CLINICAL DATA:  Right upper lateral thigh wound with drainage, altered mental status. EXAM: CT OF THE LOWER RIGHT EXTREMITY WITHOUT CONTRAST TECHNIQUE: Multidetector CT imaging of the right lower extremity was performed according to the standard protocol. COMPARISON:  12/05/2020 FINDINGS: Bones/Joint/Cartilage Considerable articular space narrowing in the right hip. No fracture or destructive bony findings. 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 Trace knee effusion. No definite hip joint effusion. Ligaments  Suboptimally assessed by CT. Muscles and Tendons Worsened thickening along the superficial fascia margin of the right anterior muscular compartment laterally with some reduction in distinctness of fat planes in the vastus lateralis muscle which could indicate and are lying myositis. Soft tissues Subcutaneous edema laterally in the upper thigh and circumferentially in the mid and distal thigh and knee region. Cutaneous wound along the anterolateral upper thigh with locules of gas tracking within a band of subcutaneous density over an approximately 13.8 cm excursion for example as shown on images 19 through 30 of series 10. Substantially improved appearance of the large mixed density collection along the vastus lateralis compared to prior, notably reduced in size and indistinctly  marginated. Pelvic ascites noted. IMPRESSION: 1. Substantially reduced volume of the mixed density collection within along the right vastus lateralis compared to the 01/20/2021 exam. Locules of subcutaneous gas density track along an approximately 13 cm vertical excursion. 2. Inflammatory stranding extends down to the superficial fascia margin and there is some mild effacement of fat planes in the vastus lateralis compared to prior raising the possibility of early myositis. I am skeptical that this is leading to an overt compartment syndrome at this time given the persistence of some of the fat planes, but worsening could increase risk of compartment syndrome. 3. Trace knee effusion. 4. Degenerative loss of articular space in the right hip joint. 5. Circumferential edema around the knee and distal thigh with lateral edema along the proximal thigh, suspicious for cellulitis. 6. Pelvic ascites. Electronically Signed   By: Van Clines M.D.   On: 02/12/2021 11:08   DG Chest Port 1 View  Result Date: 02/23/2021 CLINICAL DATA:  Questionable sepsis. Right leg infection for 5 weeks EXAM: PORTABLE CHEST 1 VIEW COMPARISON:  01/20/2021 FINDINGS: Low volume chest with interstitial coarsening that is stable and compatible with scarring based on March 2022 chest CT. Perma catheter on the left with tip at the lower right atrium. Dual-chamber pacer leads from the right in stable position. Prior CABG. Extensive artifact from EKG leads. IMPRESSION: Chronic low volume chest with scarring. No acute finding when compared to priors. Electronically Signed   By: Monte Fantasia M.D.   On: 03/06/2021 09:45    Procedures Ultrasound ED Peripheral IV (Provider)  Date/Time: 02/24/2021 11:10 AM Performed by: Sherwood Gambler, MD Authorized by: Sherwood Gambler, MD   Procedure details:    Indications: multiple failed IV attempts and poor IV access     Skin Prep: chlorhexidine gluconate     Location:  Right AC   Angiocath:  20  G   Bedside Ultrasound Guided: Yes     Patient tolerated procedure without complications: Yes     Dressing applied: Yes   .Suture Removal  Date/Time: 02/08/2021 3:39 PM Performed by: Sherwood Gambler, MD Authorized by: Sherwood Gambler, MD   Consent:    Consent obtained:  Verbal   Consent given by:  Patient Location:    Location:  Lower extremity   Lower extremity location:  Leg   Leg location:  R upper leg Procedure details:    Wound appearance:  Purulent, warm, red and tender   Number of sutures removed:  6 Post-procedure details:    Procedure completion:  Tolerated well, no immediate complications .Critical Care  Date/Time: 02/09/2021 3:40 PM Performed by: Sherwood Gambler, MD Authorized by: Sherwood Gambler, MD   Critical care provider statement:    Critical care time (minutes):  30   Critical care time was exclusive of:  Separately  billable procedures and treating other patients   Critical care was time spent personally by me on the following activities:  Discussions with consultants, evaluation of patient's response to treatment, examination of patient, ordering and performing treatments and interventions, ordering and review of laboratory studies, ordering and review of radiographic studies, pulse oximetry, re-evaluation of patient's condition, obtaining history from patient or surrogate and review of old charts   Medications Ordered in ED Medications  vancomycin (VANCOCIN) IVPB 1000 mg/200 mL premix (has no administration in time range)  ceFEPIme (MAXIPIME) 2 g in sodium chloride 0.9 % 100 mL IVPB (has no administration in time range)  collagenase (SANTYL) ointment ( Topical Given 02/09/2021 1422)  acetaminophen (TYLENOL) tablet 650 mg (has no administration in time range)    Or  acetaminophen (TYLENOL) suppository 650 mg (has no administration in time range)  ondansetron (ZOFRAN) tablet 4 mg (has no administration in time range)    Or  ondansetron (ZOFRAN) injection 4 mg (has  no administration in time range)  fentaNYL (DURAGESIC) 25 MCG/HR 1 patch (has no administration in time range)  HYDROmorphone (DILAUDID) tablet 2 mg (2 mg Oral Given 03/04/2021 1502)  amiodarone (PACERONE) tablet 200 mg (200 mg Oral Given 02/22/2021 1503)  atorvastatin (LIPITOR) tablet 40 mg (has no administration in time range)  cholestyramine light (PREVALITE) packet 4 g (has no administration in time range)  digoxin (LANOXIN) tablet 0.0625 mg (has no administration in time range)  metoprolol succinate (TOPROL-XL) 24 hr tablet 12.5 mg (has no administration in time range)  midodrine (PROAMATINE) tablet 10 mg (has no administration in time range)  nitroGLYCERIN (NITROSTAT) SL tablet 0.4 mg (has no administration in time range)  Basaglar KwikPen KwikPen 15 Units (has no administration in time range)  levothyroxine (SYNTHROID) tablet 25 mcg (has no administration in time range)  calcium acetate (PHOSLO) capsule 1,334 mg (has no administration in time range)  docusate sodium (COLACE) capsule 100 mg (has no administration in time range)  ferric citrate (AURYXIA) tablet 210 mg (has no administration in time range)  pantoprazole (PROTONIX) EC tablet 40 mg (has no administration in time range)  polyethylene glycol (MIRALAX / GLYCOLAX) packet 17 g (has no administration in time range)  sevelamer carbonate (RENVELA) tablet 1,600 mg (has no administration in time range)  apixaban (ELIQUIS) tablet 5 mg (5 mg Oral Given 03/07/2021 1502)  clopidogrel (PLAVIX) tablet 75 mg (has no administration in time range)  clonazepam (KLONOPIN) disintegrating tablet 0.25 mg (has no administration in time range)  pregabalin (LYRICA) capsule 25 mg (25 mg Oral Given 03/09/2021 1539)  latanoprost (XALATAN) 0.005 % ophthalmic solution 1 drop (has no administration in time range)  insulin aspart (novoLOG) injection 0-9 Units (has no administration in time range)  insulin aspart (novoLOG) injection 0-5 Units (has no administration in time  range)  Darbepoetin Alfa (ARANESP) injection 150 mcg (has no administration in time range)  (feeding supplement) PROSource Plus liquid 30 mL (has no administration in time range)  multivitamin (RENA-VIT) tablet 1 tablet (has no administration in time range)  Chlorhexidine Gluconate Cloth 2 % PADS 6 each (has no administration in time range)  lactated ringers bolus 500 mL (0 mLs Intravenous Stopped 03/04/2021 1249)  vancomycin (VANCOREADY) IVPB 2000 mg/400 mL (0 mg Intravenous Stopped 02/09/2021 1424)  ceFEPIme (MAXIPIME) 2 g in sodium chloride 0.9 % 100 mL IVPB (0 g Intravenous Stopped 02/11/2021 1151)    ED Course  I have reviewed the triage vital signs and the nursing notes.  Pertinent  labs & imaging results that were available during my care of the patient were reviewed by me and considered in my medical decision making (see chart for details).    MDM Rules/Calculators/A&P                           Patient presents with a recurrent worsening wound infection.  He has some mild hypotension with BPs in the 90s and was given a small bolus of fluids.  His lactate is okay but I am concerned he is having worsening infection he was given broad IV antibiotics for potential postop organisms.  Sutures removed after discussion with orthopedics.  They do not feel he needs to go emergently to the OR but needs IV antibiotics, wound care and may need OR later.  Hospitalist to admit.  Patient was noticed little somnolent and so blood gases were obtained which did show some acidosis but no hypercarbia.  I do not think he needs BiPAP.  He is eating currently and speaking clearly.  Unclear why he is mildly acidotic. Final Clinical Impression(s) / ED Diagnoses Final diagnoses:  Wound infection    Rx / DC Orders ED Discharge Orders     None        Sherwood Gambler, MD 02/23/2021 1541

## 2021-02-08 DIAGNOSIS — E1151 Type 2 diabetes mellitus with diabetic peripheral angiopathy without gangrene: Secondary | ICD-10-CM | POA: Diagnosis not present

## 2021-02-08 DIAGNOSIS — T148XXA Other injury of unspecified body region, initial encounter: Secondary | ICD-10-CM | POA: Diagnosis not present

## 2021-02-08 DIAGNOSIS — N186 End stage renal disease: Secondary | ICD-10-CM | POA: Diagnosis not present

## 2021-02-08 DIAGNOSIS — S71101S Unspecified open wound, right thigh, sequela: Secondary | ICD-10-CM | POA: Diagnosis not present

## 2021-02-08 DIAGNOSIS — Z992 Dependence on renal dialysis: Secondary | ICD-10-CM

## 2021-02-08 LAB — RENAL FUNCTION PANEL
Albumin: 2.5 g/dL — ABNORMAL LOW (ref 3.5–5.0)
Anion gap: 11 (ref 5–15)
BUN: 16 mg/dL (ref 8–23)
CO2: 24 mmol/L (ref 22–32)
Calcium: 8.6 mg/dL — ABNORMAL LOW (ref 8.9–10.3)
Chloride: 103 mmol/L (ref 98–111)
Creatinine, Ser: 3.4 mg/dL — ABNORMAL HIGH (ref 0.61–1.24)
GFR, Estimated: 19 mL/min — ABNORMAL LOW
Glucose, Bld: 88 mg/dL (ref 70–99)
Phosphorus: 2 mg/dL — ABNORMAL LOW (ref 2.5–4.6)
Potassium: 3.2 mmol/L — ABNORMAL LOW (ref 3.5–5.1)
Sodium: 138 mmol/L (ref 135–145)

## 2021-02-08 LAB — COMPREHENSIVE METABOLIC PANEL
ALT: 7 U/L (ref 0–44)
AST: 44 U/L — ABNORMAL HIGH (ref 15–41)
Albumin: 2.2 g/dL — ABNORMAL LOW (ref 3.5–5.0)
Alkaline Phosphatase: 89 U/L (ref 38–126)
Anion gap: 18 — ABNORMAL HIGH (ref 5–15)
BUN: 34 mg/dL — ABNORMAL HIGH (ref 8–23)
CO2: 19 mmol/L — ABNORMAL LOW (ref 22–32)
Calcium: 8.9 mg/dL (ref 8.9–10.3)
Chloride: 103 mmol/L (ref 98–111)
Creatinine, Ser: 6.17 mg/dL — ABNORMAL HIGH (ref 0.61–1.24)
GFR, Estimated: 9 mL/min — ABNORMAL LOW (ref >60)
Glucose, Bld: 98 mg/dL (ref 70–99)
Potassium: 3.7 mmol/L (ref 3.5–5.1)
Sodium: 140 mmol/L (ref 135–145)
Total Bilirubin: 1.3 mg/dL — ABNORMAL HIGH (ref 0.3–1.2)
Total Protein: 6.3 g/dL — ABNORMAL LOW (ref 6.5–8.1)

## 2021-02-08 LAB — CBC
HCT: 28 % — ABNORMAL LOW (ref 39.0–52.0)
HCT: 26.9 % — ABNORMAL LOW (ref 39.0–52.0)
Hemoglobin: 8.2 g/dL — ABNORMAL LOW (ref 13.0–17.0)
Hemoglobin: 8.1 g/dL — ABNORMAL LOW (ref 13.0–17.0)
MCH: 28.4 pg (ref 26.0–34.0)
MCH: 28.1 pg (ref 26.0–34.0)
MCHC: 29.3 g/dL — ABNORMAL LOW (ref 30.0–36.0)
MCHC: 30.1 g/dL (ref 30.0–36.0)
MCV: 96.9 fL (ref 80.0–100.0)
MCV: 93.4 fL (ref 80.0–100.0)
Platelets: 201 10*3/uL (ref 150–400)
Platelets: 197 10*3/uL (ref 150–400)
RBC: 2.89 MIL/uL — ABNORMAL LOW (ref 4.22–5.81)
RBC: 2.88 MIL/uL — ABNORMAL LOW (ref 4.22–5.81)
RDW: 22.4 % — ABNORMAL HIGH (ref 11.5–15.5)
RDW: 22.4 % — ABNORMAL HIGH (ref 11.5–15.5)
WBC: 9.4 10*3/uL (ref 4.0–10.5)
WBC: 9.7 10*3/uL (ref 4.0–10.5)
nRBC: 0.2 % (ref 0.0–0.2)
nRBC: 0 % (ref 0.0–0.2)

## 2021-02-08 LAB — GLUCOSE, CAPILLARY
Glucose-Capillary: 78 mg/dL (ref 70–99)
Glucose-Capillary: 115 mg/dL — ABNORMAL HIGH (ref 70–99)
Glucose-Capillary: 120 mg/dL — ABNORMAL HIGH (ref 70–99)

## 2021-02-08 MED ORDER — MIDODRINE HCL 5 MG PO TABS: 10.0000 mg | ORAL_TABLET | Freq: Once | ORAL | Status: DC

## 2021-02-08 MED ORDER — ALTEPLASE 2 MG IJ SOLR: 2.0000 mg | Freq: Once | INTRAMUSCULAR | Status: DC | PRN

## 2021-02-08 MED ORDER — PENTAFLUOROPROP-TETRAFLUOROETH EX AERO: 1.0000 "application " | INHALATION_SPRAY | CUTANEOUS | Status: DC | PRN

## 2021-02-08 MED ORDER — SODIUM CHLORIDE 0.9 % IV SOLN: 100.0000 mL | INTRAVENOUS | Status: DC | PRN

## 2021-02-08 MED ORDER — HEPARIN SODIUM (PORCINE) 1000 UNIT/ML DIALYSIS: 3200.0000 [IU] | INTRAMUSCULAR | Status: DC | PRN

## 2021-02-08 MED ORDER — INSULIN GLARGINE-YFGN 100 UNIT/ML ~~LOC~~ SOLN
8.0000 [IU] | Freq: Every day | SUBCUTANEOUS | Status: DC
  Administered 2021-02-08 – 2021-02-13 (×6): 8 [IU] via SUBCUTANEOUS
  Filled 2021-02-08 (×7): qty 0.08

## 2021-02-08 MED ORDER — ALBUMIN HUMAN 25 % IV SOLN
INTRAVENOUS | Status: AC
  Administered 2021-02-08: 25 g via INTRAVENOUS
  Filled 2021-02-08: qty 100

## 2021-02-08 MED ORDER — ORAL CARE MOUTH RINSE
15.0000 mL | Freq: Two times a day (BID) | OROMUCOSAL | Status: DC
  Administered 2021-02-08 – 2021-02-27 (×28): 15 mL via OROMUCOSAL

## 2021-02-08 MED ORDER — VANCOMYCIN HCL IN DEXTROSE 1-5 GM/200ML-% IV SOLN
1000.0000 mg | INTRAVENOUS | Status: DC
  Administered 2021-02-08 – 2021-02-12 (×3): 1000 mg via INTRAVENOUS
  Filled 2021-02-08 (×5): qty 200

## 2021-02-08 MED ORDER — SODIUM CHLORIDE 0.9 % IV SOLN
2.0000 g | INTRAVENOUS | Status: DC
  Administered 2021-02-08 – 2021-02-15 (×4): 2 g via INTRAVENOUS
  Filled 2021-02-08 (×5): qty 2

## 2021-02-08 MED ORDER — LIDOCAINE HCL (PF) 1 % IJ SOLN: 5.0000 mL | INTRAMUSCULAR | Status: DC | PRN

## 2021-02-08 MED ORDER — HEPARIN SODIUM (PORCINE) 1000 UNIT/ML DIALYSIS
1000.0000 [IU] | INTRAMUSCULAR | Status: DC | PRN
  Administered 2021-02-10: 1000 [IU] via INTRAVENOUS_CENTRAL

## 2021-02-08 MED ORDER — MIDODRINE HCL 5 MG PO TABS
ORAL_TABLET | ORAL | Status: AC
  Administered 2021-02-08: 10 mg via ORAL
  Filled 2021-02-08: qty 2

## 2021-02-08 MED ORDER — LIDOCAINE-PRILOCAINE 2.5-2.5 % EX CREA: 1.0000 "application " | TOPICAL_CREAM | CUTANEOUS | Status: DC | PRN

## 2021-02-08 MED ORDER — DARBEPOETIN ALFA 150 MCG/0.3ML IJ SOSY
PREFILLED_SYRINGE | INTRAMUSCULAR | Status: AC
  Administered 2021-02-08: 150 ug via INTRAVENOUS
  Filled 2021-02-08: qty 0.3

## 2021-02-08 MED ORDER — HEPARIN SODIUM (PORCINE) 1000 UNIT/ML IJ SOLN
INTRAMUSCULAR | Status: AC
  Filled 2021-02-08: qty 5

## 2021-02-08 MED ORDER — HEPARIN SODIUM (PORCINE) 1000 UNIT/ML IJ SOLN
INTRAMUSCULAR | Status: AC
  Administered 2021-02-08: 1000 [IU]
  Filled 2021-02-08: qty 4

## 2021-02-08 MED ORDER — CHLORHEXIDINE GLUCONATE 0.12 % MT SOLN
15.0000 mL | Freq: Two times a day (BID) | OROMUCOSAL | Status: DC
  Administered 2021-02-08 – 2021-03-01 (×39): 15 mL via OROMUCOSAL
  Filled 2021-02-08 (×37): qty 15

## 2021-02-08 MED ORDER — ALBUMIN HUMAN 25 % IV SOLN: 25.0000 g | Freq: Once | INTRAVENOUS | Status: AC

## 2021-02-08 MED ORDER — CHLORHEXIDINE GLUCONATE CLOTH 2 % EX PADS
6.0000 | MEDICATED_PAD | Freq: Every day | CUTANEOUS | Status: DC
  Administered 2021-02-09 – 2021-02-21 (×10): 6 via TOPICAL

## 2021-02-08 NOTE — Progress Notes (Signed)
Pt arrived to unit from dialysis by transport. Pt alert and oriented x4 in no acute distress. VSS. Respirations even and unlabored on RA. Pt is 2 assist. Assessment completed. Skin assessment completed with Cyril Mourning RN. Pt oriented to room. Pt encouraged to use call bell. Call bell within reach. Bed in low position.

## 2021-02-08 NOTE — Progress Notes (Addendum)
[facility] Progress Note   Subjective:Seen on HD via TDC. Still holding in ED-no bed yet. Oozing noted from thigh wound. DC heparin/hold mid run dose. BP high 90s. Repeat Midodrine 10 mg PO mid run.  Will also try to use albumin   Objective Vitals:   02/08/21 0315 02/08/21 0415 02/08/21 0515 02/08/21 0600  BP: (!) 102/55 114/64 130/61 114/60  Pulse: 74 81 77 84  Resp: '12 10 11 17  '$ Temp:      TempSrc:      SpO2: 96% 96% 99% 94%  Weight:      Height:       Physical Exam  General: Chronically ill appearing male in NAD Heart: S1,S2 RRR No M/R/G Lungs: CTAB Anteriorly Abdomen: obese with edema lower abd Extremities: 1-2+ pitting edema BLE. Toe amps both feet. Has Wound R lateral thigh with purulent drainage, sutures removed wound left open to air, now draining serous drainage.  erythema and induration present. Dialysis Access: [facility] TDC blood lines connected.     Additional Objective Labs: Basic Metabolic Panel: Recent Labs  Lab 02/11/2021 1110 02/24/2021 1151 02/16/2021 1234 02/08/2021 1306 02/08/21 0447  NA 142   < > 144 143 140  K 3.7   < > 3.4* 3.5 3.7  CL 105  --   --   --  103  CO2 20*  --   --   --  19*  GLUCOSE 100*  --   --   --  98  BUN 29*  --   --   --  34*  CREATININE 5.54*  --   --   --  6.17*  CALCIUM 8.9  --   --   --  8.9   < > = values in this interval not displayed.   Liver Function Tests: Recent Labs  Lab 02/16/2021 1110 02/08/21 0447  AST 49* 44*  ALT 6 7  ALKPHOS 89 89  BILITOT 1.0 1.3*  PROT 6.8 6.3*  ALBUMIN 2.3* 2.2*   No results for input(s): LIPASE, AMYLASE in the last 168 hours. CBC: Recent Labs  Lab 02/22/2021 1110 02/24/2021 1151 03/02/2021 1234 02/28/2021 1306 02/08/21 0447  WBC 10.4  --   --   --  9.4  NEUTROABS 7.5  --   --   --   --   HGB 7.5*   < > 8.5* 8.8* 8.2*  HCT 25.4*   < > 25.0* 26.0* 28.0*  MCV 96.9  --   --   --  96.9  PLT 229  --   --   --  201   < > = values in this interval not displayed.   Blood  Culture    Component Value Date/Time   SDES ABSCESS 01/20/2021 1710   SPECREQUEST RIGHT THIGH ABSCESS SPEC A 01/20/2021 1710   CULT  01/20/2021 1710    FEW SERRATIA MARCESCENS NO ANAEROBES ISOLATED Performed at [facility], [place] [address] [place], [place] [address]    REPTSTATUS 01/25/2021 FINAL 01/20/2021 1710    Cardiac Enzymes: No results for input(s): CKTOTAL, CKMB, CKMBINDEX, TROPONINI in the last 168 hours. CBG: Recent Labs  Lab 02/12/2021 1004 02/09/2021 1717 02/11/2021 2204  GLUCAP 106* 115* 137*   Iron Studies: No results for input(s): IRON, TIBC, TRANSFERRIN, FERRITIN in the last 72 hours. '@[email or web address]'$ @ Studies/Results: CT FEMUR RIGHT WO CONTRAST  Result Date: 02/11/2021 CLINICAL DATA:  Right upper lateral thigh wound with drainage, altered mental status. EXAM: CT OF THE LOWER  RIGHT EXTREMITY WITHOUT CONTRAST TECHNIQUE: Multidetector CT imaging of the right lower extremity was performed according to the standard protocol. COMPARISON:  12/05/2020 FINDINGS: Bones/Joint/Cartilage Considerable articular space narrowing in the right hip. No fracture or destructive bony findings. 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 Trace knee effusion. No definite hip joint effusion. Ligaments Suboptimally assessed by CT. Muscles and Tendons Worsened thickening along the superficial fascia margin of the right anterior muscular compartment laterally with some reduction in distinctness of fat planes in the vastus lateralis muscle which could indicate and are lying myositis. Soft tissues Subcutaneous edema laterally in the upper thigh and circumferentially in the mid and distal thigh and knee region. Cutaneous wound along the anterolateral upper thigh with locules of gas tracking within a band of subcutaneous density over an approximately 13.8 cm excursion for example as shown on images 19 through 30 of series 10. Substantially improved  appearance of the large mixed density collection along the vastus lateralis compared to prior, notably reduced in size and indistinctly marginated. Pelvic ascites noted. IMPRESSION: 1. Substantially reduced volume of the mixed density collection within along the right vastus lateralis compared to the 01/20/2021 exam. Locules of subcutaneous gas density track along an approximately 13 cm vertical excursion. 2. Inflammatory stranding extends down to the superficial fascia margin and there is some mild effacement of fat planes in the vastus lateralis compared to prior raising the possibility of early myositis. I am skeptical that this is leading to an overt compartment syndrome at this time given the persistence of some of the fat planes, but worsening could increase risk of compartment syndrome. 3. Trace knee effusion. 4. Degenerative loss of articular space in the right hip joint. 5. Circumferential edema around the knee and distal thigh with lateral edema along the proximal thigh, suspicious for cellulitis. 6. Pelvic ascites. Electronically Signed   By: Van Clines M.D.   On: 03/04/2021 11:08   DG Chest Port 1 View  Result Date: 03/04/2021 CLINICAL DATA:  Questionable sepsis. Right leg infection for 5 weeks EXAM: PORTABLE CHEST 1 VIEW COMPARISON:  01/20/2021 FINDINGS: Low volume chest with interstitial coarsening that is stable and compatible with scarring based on March 2022 chest CT. Perma catheter on the left with tip at the lower right atrium. Dual-chamber pacer leads from the right in stable position. Prior CABG. Extensive artifact from EKG leads. IMPRESSION: Chronic low volume chest with scarring. No acute finding when compared to priors. Electronically Signed   By: Monte Fantasia M.D.   On: 02/13/2021 09:45   Medications:  sodium chloride     sodium chloride     [START ON 02/09/2021] ceFEPime (MAXIPIME) IV     [START ON 02/09/2021] vancomycin      (feeding supplement) PROSource Plus  30 mL Oral  BID BM   amiodarone  200 mg Oral BID   apixaban  5 mg Oral BID   atorvastatin  40 mg Oral QPM   calcium acetate  1,334 mg Oral TID WC   Chlorhexidine Gluconate Cloth  6 each Topical Q0600   cholestyramine light  4 g Oral BID   clopidogrel  75 mg Oral q AM   collagenase   Topical Daily   darbepoetin (ARANESP) injection - DIALYSIS  150 mcg Intravenous Once   digoxin  0.0625 mg Oral QODAY   fentaNYL  1 patch Transdermal Q72H   ferric citrate  210 mg Oral TID WC   insulin aspart  0-5 Units Subcutaneous QHS  insulin aspart  0-9 Units Subcutaneous TID WC   insulin glargine-yfgn  15 Units Subcutaneous QHS   latanoprost  1 drop Both Eyes QHS   levothyroxine  25 mcg Oral QAC breakfast   metoprolol succinate  12.5 mg Oral Daily   midodrine  10 mg Oral TID WC   multivitamin  1 tablet Oral QHS   pantoprazole  40 mg Oral Q0600   pregabalin  25 mg Oral BID   sevelamer carbonate  1,600 mg Oral TID WC     Dialysis Orders: Center: [facility] MWF 4 hrs F250 450/800 3.0K/2.5 Ca [facility] TDC  EDW 111 -Heparin 4900 units IV initial bolus, 500 units hourly, DC last hour of tx   Assessment/Plan:  R. Thigh Abscess: ABX started. Per primary- maxipime and vanc  ESRD - MWF however will switch to T,Th,S D/T high MWF census. K+ 3.7 Use 4.0 K bath. Oozing from wound. Hold heparin   Hypertension/volume  -BP is soft with evidence of volume excess by exam. Agrees to attempt to lower volume as tolerated. Continue midodrine as previously ordered. Give extra dose now. Likely that his true EDW is lower.   Anemia  -HGB 8.2. Getting orders for ESA from [facility].Start Aranesp with HD today  Metabolic bone disease -  C Ca high, suspect VDRA is on hold. Continue binders- renvela and auryxia both ordered. Added RFP to today's labs-will chose a binder when we know results of PO4.   Nutrition - Albumin low. Renal/Carb mod diet with protein supps, renal vits. DMT2-per primary Hypothyroidism-per primary H/O PAF: Was scheduled for  cardioversion but this has not happened yet. Patient requests that cardiology be made aware of admission. Continue apixaban, low dose metoprolol, digoxin.     Rita H. Brown NP-C 02/08/2021, 9:06 AM  [facility] [phone]  Patient seen and examined, agree with above note with above modifications. Seen on HD-  no significant change from last night-  abx and attempting volume removal with HD-  not that successful Corliss Parish, MD 02/08/2021

## 2021-02-08 NOTE — ED Notes (Signed)
Pt had a bm. Pt cleaned and changed at this time

## 2021-02-08 NOTE — Procedures (Signed)
Patient was seen on dialysis and the procedure was supervised.  BFR 400  Via TDC BP is  low-  giving midodrine and albumin to help with volume removal.    Kyle Walton 02/08/2021

## 2021-02-08 NOTE — Progress Notes (Signed)
PROGRESS NOTE    Argenis Steeb  [id number] DOB: 03-29-1956 DOA: 02/13/2021 PCP: Patient, No Pcp Per (Inactive)  Brief Narrative: 65 year old male chronically ill with ESRD, anemia of chronic disease, type 2 diabetes mellitus, chronic diastolic CHF, paroxysmal A. fib on Eliquis, COPD/chronic hypoxic respiratory failure on 2 L home O2, nocturnal BiPAP, hypothyroidism, recently discharged from [facility] 7/22, treated for right thigh abscess, underwent I&D on 7/14,, completed 7 days of IV therapy inpatient and discharged to SNF on oral ciprofloxacin, which she allegedly may not have gotten.  Was seen by the wound nurse on 8/1, noted to have increased swelling tenderness and drainage from the wound, subsequently sent to the ED. -In the emergency room was briefly hypotensive, subsequently stabilized, started on broad-spectrum antibiotics   Assessment & Plan:   Right thigh abscess -Recent I&D on 7/14, cultures grew Serratia resistant to Ancef -Now with clinical worsening, CT with some concern for developing compartment syndrome, reduced abscess volume, myositis -Appreciate orthopedics input, sutures removed, continue broad-spectrum antibiotics and wound care -May need repeat debridement, orthopedics to follow  ESRD on hemodialysis -Nephrology consulting, HD today  Type 2 diabetes mellitus -CBGs tightly controlled, decrease Lantus dose  Chronic hypotension -Continue midodrine  Chronic diastolic CHF -Volume managed with HD  CAD/CABG -Continue beta-blocker, Plavix and statin  Paroxysmal atrial fibrillation -Resume Toprol, amiodarone, Eliquis  COPD/chronic respiratory failure -Resume BiPAP nightly  History of PAD -Prior toe amputation and metatarsal amputation -Continue Plavix, statin  DVT prophylaxis: Apixaban Code Status: Full code Family Communication: Discussed patient in detail, no family at bedside Disposition Plan:  Status is: Inpatient  Remains inpatient appropriate  because:Inpatient level of care appropriate due to severity of illness  Dispo: The patient is from: SNF              Anticipated d/c is to: SNF              Patient currently is not medically stable to d/c.   Difficult to place patient No   \ Consultants:  Orthopedics Nephrology  Procedures:   Antimicrobials:    Subjective: -Complains of pain in his right thigh  Objective: Vitals:   02/08/21 1100 02/08/21 1130 02/08/21 1145 02/08/21 1246  BP: (!) 89/48 (!) 93/48 (!) 99/45 (!) 100/54  Pulse: 84 80 85 77  Resp:   18 17  Temp:   (!) 97.5 F (36.4 C) (!) 97 F (36.1 C)  TempSrc:   Oral Oral  SpO2:   94%   Weight:    101.2 kg  Height:    '5\' 10"'$  (1.778 m)    Intake/Output Summary (Last 24 hours) at 02/08/2021 1404 Last data filed at 02/08/2021 1145 Gross per 24 hour  Intake --  Output 2700 ml  Net -2700 ml   Filed Weights   02/20/2021 0910 02/08/21 1246  Weight: 110.2 kg 101.2 kg    Examination:  General exam: Chronically ill male, laying in bed on dialysis, awake alert oriented x2, cognitive deficits noted HEENT: Left IJ HD catheter noted CVS: S1-S2, regular rate rhythm Lungs: Decreased breath sounds to bases Abdomen: Soft, nontender, bowel sounds present Extremities: Right thigh wound with surrounding erythema, tenderness, proximal induration, prior transmetatarsal amputation of right foot and amputation of 2 toes on left foot  Skin: As above Psychiatry: Poor insight   Data Reviewed:   CBC: Recent Labs  Lab 02/10/2021 1110 03/03/2021 1151 02/10/2021 1234 03/06/2021 1306 02/08/21 0447  WBC 10.4  --   --   --  9.4  NEUTROABS 7.5  --   --   --   --   HGB 7.5* 14.3 8.5* 8.8* 8.2*  HCT 25.4* 42.0 25.0* 26.0* 28.0*  MCV 96.9  --   --   --  96.9  PLT 229  --   --   --  123456   Basic Metabolic Panel: Recent Labs  Lab 02/14/2021 1110 03/09/2021 1150 02/08/2021 1151 02/16/2021 1234 03/04/2021 1306 02/08/21 0447  NA 142  --  143 144 143 140  K 3.7  --  3.6 3.4* 3.5 3.7   CL 105  --   --   --   --  103  CO2 20*  --   --   --   --  19*  GLUCOSE 100*  --   --   --   --  98  BUN 29*  --   --   --   --  34*  CREATININE 5.54*  --   --   --   --  6.17*  CALCIUM 8.9  --   --   --   --  8.9  MG  --  1.9  --   --   --   --    GFR: Estimated Creatinine Clearance: 14.2 mL/min (A) (by C-G formula based on SCr of 6.17 mg/dL (H)). Liver Function Tests: Recent Labs  Lab 03/04/2021 1110 02/08/21 0447  AST 49* 44*  ALT 6 7  ALKPHOS 89 89  BILITOT 1.0 1.3*  PROT 6.8 6.3*  ALBUMIN 2.3* 2.2*   No results for input(s): LIPASE, AMYLASE in the last 168 hours. No results for input(s): AMMONIA in the last 168 hours. Coagulation Profile: Recent Labs  Lab 02/23/2021 1110  INR 1.9*   Cardiac Enzymes: No results for input(s): CKTOTAL, CKMB, CKMBINDEX, TROPONINI in the last 168 hours. BNP (last 3 results) No results for input(s): PROBNP in the last 8760 hours. HbA1C: Recent Labs    02/12/2021 1327  HGBA1C 7.2*   CBG: Recent Labs  Lab 02/09/2021 1004 03/04/2021 1717 03/02/2021 2204  GLUCAP 106* 115* 137*   Lipid Profile: No results for input(s): CHOL, HDL, LDLCALC, TRIG, CHOLHDL, LDLDIRECT in the last 72 hours. Thyroid Function Tests: No results for input(s): TSH, T4TOTAL, FREET4, T3FREE, THYROIDAB in the last 72 hours. Anemia Panel: No results for input(s): VITAMINB12, FOLATE, FERRITIN, TIBC, IRON, RETICCTPCT in the last 72 hours. Urine analysis: No results found for: COLORURINE, APPEARANCEUR, LABSPEC, [place], GLUCOSEU, HGBUR, BILIRUBINUR, KETONESUR, PROTEINUR, UROBILINOGEN, NITRITE, LEUKOCYTESUR Sepsis Labs: '@LABRCNTIP'$ (procalcitonin:4,lacticidven:4)  ) Recent Results (from the past 240 hour(s))  Resp Panel by RT-PCR (Flu A&B, Covid) Nasopharyngeal Swab     Status: None   Collection Time: 03/09/2021  9:55 AM   Specimen: Nasopharyngeal Swab; Nasopharyngeal(NP) swabs in vial transport medium  Result Value Ref Range Status   SARS Coronavirus 2 by RT PCR NEGATIVE  NEGATIVE Final    Comment: (NOTE) SARS-CoV-2 target nucleic acids are NOT DETECTED.  The SARS-CoV-2 RNA is generally detectable in upper respiratory specimens during the acute phase of infection. The lowest concentration of SARS-CoV-2 viral copies this assay can detect is 138 copies/mL. A negative result does not preclude SARS-Cov-2 infection and should not be used as the sole basis for treatment or other patient management decisions. A negative result may occur with  improper specimen collection/handling, submission of specimen other than nasopharyngeal swab, presence of viral mutation(s) within the areas targeted by this assay, and inadequate number of viral copies(<138 copies/mL). A negative result must  be combined with clinical observations, patient history, and epidemiological information. The expected result is Negative.  Fact Sheet for Patients:  EntrepreneurPulse.com.au  Fact Sheet for Healthcare Providers:  IncredibleEmployment.be  This test is no t yet approved or cleared by the [place] FDA and  has been authorized for detection and/or diagnosis of SARS-CoV-2 by FDA under an Emergency Use Authorization (EUA). This EUA will remain  in effect (meaning this test can be used) for the duration of the COVID-19 declaration under Section 564(b)(1) of the Act, 21 U.S.C.section 360bbb-3(b)(1), unless the authorization is terminated  or revoked sooner.       Influenza A by PCR NEGATIVE NEGATIVE Final   Influenza B by PCR NEGATIVE NEGATIVE Final    Comment: (NOTE) The Xpert Xpress SARS-CoV-2/FLU/RSV plus assay is intended as an aid in the diagnosis of influenza from Nasopharyngeal swab specimens and should not be used as a sole basis for treatment. Nasal washings and aspirates are unacceptable for Xpert Xpress SARS-CoV-2/FLU/RSV testing.  Fact Sheet for Patients: EntrepreneurPulse.com.au  Fact Sheet for Healthcare  Providers: IncredibleEmployment.be  This test is not yet approved or cleared by the [place] FDA and has been authorized for detection and/or diagnosis of SARS-CoV-2 by FDA under an Emergency Use Authorization (EUA). This EUA will remain in effect (meaning this test can be used) for the duration of the COVID-19 declaration under Section 564(b)(1) of the Act, 21 U.S.C. section 360bbb-3(b)(1), unless the authorization is terminated or revoked.  Performed at [facility], [place] [address], [place], [place] [address]   Blood Culture (routine x 2)     Status: None (Preliminary result)   Collection Time: 02/09/2021 11:10 AM   Specimen: BLOOD  Result Value Ref Range Status   Specimen Description BLOOD RIGHT ANTECUBITAL  Final   Special Requests   Final    BOTTLES DRAWN AEROBIC ONLY Blood Culture adequate volume   Culture   Final    NO GROWTH < 24 HOURS Performed at [facility], [place] [address], [place],  [address]    Report Status PENDING  Incomplete         Radiology Studies: CT FEMUR RIGHT WO CONTRAST  Result Date: 02/19/2021 CLINICAL DATA:  Right upper lateral thigh wound with drainage, altered mental status. EXAM: CT OF THE LOWER RIGHT EXTREMITY WITHOUT CONTRAST TECHNIQUE: Multidetector CT imaging of the right lower extremity was performed according to the standard protocol. COMPARISON:  12/05/2020 FINDINGS: Bones/Joint/Cartilage Considerable articular space narrowing in the right hip. No fracture or destructive bony findings. 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 Trace knee effusion. No definite hip joint effusion. Ligaments Suboptimally assessed by CT. Muscles and Tendons Worsened thickening along the superficial fascia margin of the right anterior muscular compartment laterally with some reduction in distinctness of fat planes in the vastus lateralis muscle which could indicate and  are lying myositis. Soft tissues Subcutaneous edema laterally in the upper thigh and circumferentially in the mid and distal thigh and knee region. Cutaneous wound along the anterolateral upper thigh with locules of gas tracking within a band of subcutaneous density over an approximately 13.8 cm excursion for example as shown on images 19 through 30 of series 10. Substantially improved appearance of the large mixed density collection along the vastus lateralis compared to prior, notably reduced in size and indistinctly marginated. Pelvic ascites noted. IMPRESSION: 1. Substantially reduced volume of the mixed density collection within along the right vastus lateralis compared to the  01/20/2021 exam. Locules of subcutaneous gas density track along an approximately 13 cm vertical excursion. 2. Inflammatory stranding extends down to the superficial fascia margin and there is some mild effacement of fat planes in the vastus lateralis compared to prior raising the possibility of early myositis. I am skeptical that this is leading to an overt compartment syndrome at this time given the persistence of some of the fat planes, but worsening could increase risk of compartment syndrome. 3. Trace knee effusion. 4. Degenerative loss of articular space in the right hip joint. 5. Circumferential edema around the knee and distal thigh with lateral edema along the proximal thigh, suspicious for cellulitis. 6. Pelvic ascites. Electronically Signed   By: Van Clines M.D.   On: 03/02/2021 11:08   DG Chest Port 1 View  Result Date: 02/16/2021 CLINICAL DATA:  Questionable sepsis. Right leg infection for 5 weeks EXAM: PORTABLE CHEST 1 VIEW COMPARISON:  01/20/2021 FINDINGS: Low volume chest with interstitial coarsening that is stable and compatible with scarring based on March 2022 chest CT. Perma catheter on the left with tip at the lower right atrium. Dual-chamber pacer leads from the right in stable position. Prior CABG.  Extensive artifact from EKG leads. IMPRESSION: Chronic low volume chest with scarring. No acute finding when compared to priors. Electronically Signed   By: Monte Fantasia M.D.   On: 02/09/2021 09:45        Scheduled Meds:  (feeding supplement) PROSource Plus  30 mL Oral BID BM   amiodarone  200 mg Oral BID   apixaban  5 mg Oral BID   atorvastatin  40 mg Oral QPM   calcium acetate  1,334 mg Oral TID WC   Chlorhexidine Gluconate Cloth  6 each Topical Q0600   cholestyramine light  4 g Oral BID   clopidogrel  75 mg Oral q AM   collagenase   Topical Daily   fentaNYL  1 patch Transdermal Q72H   ferric citrate  210 mg Oral TID WC   insulin aspart  0-5 Units Subcutaneous QHS   insulin aspart  0-9 Units Subcutaneous TID WC   insulin glargine-yfgn  15 Units Subcutaneous QHS   latanoprost  1 drop Both Eyes QHS   levothyroxine  25 mcg Oral QAC breakfast   metoprolol succinate  12.5 mg Oral Daily   midodrine  10 mg Oral TID WC   multivitamin  1 tablet Oral QHS   pantoprazole  40 mg Oral Q0600   pregabalin  25 mg Oral BID   sevelamer carbonate  1,600 mg Oral TID WC   Continuous Infusions:  sodium chloride     sodium chloride     ceFEPime (MAXIPIME) IV     vancomycin       LOS: 1 day    Time spent: 34mn  PDomenic Polite MD [facility]   02/08/2021, 2:04 PM

## 2021-02-08 NOTE — ED Notes (Signed)
Lab at bedside

## 2021-02-08 NOTE — ED Notes (Signed)
resp at bedside cpap placed for sleeping at this time

## 2021-02-09 DIAGNOSIS — E1151 Type 2 diabetes mellitus with diabetic peripheral angiopathy without gangrene: Secondary | ICD-10-CM | POA: Diagnosis not present

## 2021-02-09 DIAGNOSIS — E039 Hypothyroidism, unspecified: Secondary | ICD-10-CM | POA: Diagnosis not present

## 2021-02-09 DIAGNOSIS — T148XXA Other injury of unspecified body region, initial encounter: Secondary | ICD-10-CM | POA: Diagnosis not present

## 2021-02-09 DIAGNOSIS — N186 End stage renal disease: Secondary | ICD-10-CM | POA: Diagnosis not present

## 2021-02-09 LAB — CBC
HCT: 25.7 % — ABNORMAL LOW (ref 39.0–52.0)
Hemoglobin: 8 g/dL — ABNORMAL LOW (ref 13.0–17.0)
MCH: 29 pg (ref 26.0–34.0)
MCHC: 31.1 g/dL (ref 30.0–36.0)
MCV: 93.1 fL (ref 80.0–100.0)
Platelets: 195 10*3/uL (ref 150–400)
RBC: 2.76 MIL/uL — ABNORMAL LOW (ref 4.22–5.81)
RDW: 22.7 % — ABNORMAL HIGH (ref 11.5–15.5)
WBC: 10 10*3/uL (ref 4.0–10.5)
nRBC: 0 % (ref 0.0–0.2)

## 2021-02-09 LAB — BASIC METABOLIC PANEL WITH GFR
Anion gap: 16 — ABNORMAL HIGH (ref 5–15)
BUN: 20 mg/dL (ref 8–23)
CO2: 21 mmol/L — ABNORMAL LOW (ref 22–32)
Calcium: 8.3 mg/dL — ABNORMAL LOW (ref 8.9–10.3)
Chloride: 100 mmol/L (ref 98–111)
Creatinine, Ser: 4.1 mg/dL — ABNORMAL HIGH (ref 0.61–1.24)
GFR, Estimated: 15 mL/min — ABNORMAL LOW
Glucose, Bld: 92 mg/dL (ref 70–99)
Potassium: 3.5 mmol/L (ref 3.5–5.1)
Sodium: 137 mmol/L (ref 135–145)

## 2021-02-09 LAB — GLUCOSE, CAPILLARY
Glucose-Capillary: 77 mg/dL (ref 70–99)
Glucose-Capillary: 125 mg/dL — ABNORMAL HIGH (ref 70–99)
Glucose-Capillary: 176 mg/dL — ABNORMAL HIGH (ref 70–99)
Glucose-Capillary: 159 mg/dL — ABNORMAL HIGH (ref 70–99)

## 2021-02-09 MED ORDER — ZINC OXIDE 40 % EX OINT
TOPICAL_OINTMENT | Freq: Two times a day (BID) | CUTANEOUS | Status: DC | PRN
  Filled 2021-02-09 (×4): qty 57

## 2021-02-09 MED ORDER — FERRIC CITRATE 1 GM 210 MG(FE) PO TABS
210.0000 mg | ORAL_TABLET | Freq: Two times a day (BID) | ORAL | Status: DC
  Administered 2021-02-09 – 2021-02-18 (×16): 210 mg via ORAL
  Filled 2021-02-09 (×14): qty 1

## 2021-02-09 NOTE — Progress Notes (Signed)
PROGRESS NOTE    Delmont Sherod  [id number] DOB: 05/18/1956 DOA: 02/13/2021 PCP: Patient, No Pcp Per (Inactive)  Brief Narrative: 65 year old male chronically ill with ESRD, anemia of chronic disease, type 2 diabetes mellitus, chronic diastolic CHF, paroxysmal A. fib on Eliquis, COPD/chronic hypoxic respiratory failure on 2 L home O2, nocturnal BiPAP, hypothyroidism, recently discharged from [facility] 7/22, treated for right thigh abscess, underwent I&D on 7/14,, completed 7 days of IV therapy inpatient and discharged to SNF on oral ciprofloxacin, which she allegedly may not have gotten.  Was seen by the wound nurse on 8/1, noted to have increased swelling tenderness and drainage from the wound, subsequently sent to the ED. -In the emergency room was briefly hypotensive, subsequently stabilized, started on broad-spectrum antibiotics   Assessment & Plan:   Right thigh abscess -Recent I&D on 7/14, cultures grew Serratia resistant to Ancef -Now admitted with clinical worsening, CT with some concern for developing compartment syndrome, reduced abscess volume, myositis -Appreciate orthopedics input, sutures removed to allow drainage, continue broad-spectrum antibiotics and wound care -Orthopedics to follow, may need repeat debridement if poor clinical response  ESRD on hemodialysis -Nephrology consulting, dialyzed yesterday  Type 2 diabetes mellitus -CBGs tightly controlled, continue Lantus, dose decreased  Chronic hypotension -Continue midodrine  Chronic diastolic CHF -Volume managed with HD  CAD/CABG -Continue beta-blocker, Plavix and statin  Paroxysmal atrial fibrillation -Resume Toprol, amiodarone, Eliquis  COPD/chronic respiratory failure -Resume BiPAP nightly  History of PAD -Prior toe amputation and metatarsal amputation -Continue Plavix, statin  DVT prophylaxis: Apixaban Code Status: Full code Family Communication: Discussed patient in detail, no family at  bedside Disposition Plan:  Status is: Inpatient  Remains inpatient appropriate because:Inpatient level of care appropriate due to severity of illness  Dispo: The patient is from: SNF              Anticipated d/c is to: SNF              Patient currently is not medically stable to d/c.   Difficult to place patient No   \ Consultants:  Orthopedics Nephrology  Procedures:   Antimicrobials:    Subjective: -Feels a little better today, right thigh pain and swelling is improving  Objective: Vitals:   02/09/21 0403 02/09/21 0415 02/09/21 0748 02/09/21 1200  BP: (!) 110/55  (!) 109/54 (!) 100/59  Pulse: 76  94 82  Resp: '12  17 20  '$ Temp: (!) 97.5 F (36.4 C)  97.6 F (36.4 C) 98 F (36.7 C)  TempSrc: Axillary  Oral Oral  SpO2: 97%  96% 96%  Weight:  104.9 kg    Height:        Intake/Output Summary (Last 24 hours) at 02/09/2021 1614 Last data filed at 02/08/2021 1629 Gross per 24 hour  Intake 200 ml  Output --  Net 200 ml   Filed Weights   02/08/21 1246 02/09/21 0415  Weight: 101.2 kg 104.9 kg    Examination:  General exam: Chronically ill male laying in bed, AAO x2, mild cognitive deficits HEENT: Left IJ HD catheter noted CVS: S1-S2, regular rate rhythm Lungs: Decreased breath sounds to bases Abdomen: Soft, nontender, bowel sounds present  Extremities: Right thigh wound with slough surrounding erythema, tenderness, proximal induration, prior transmetatarsal amputation of right foot and amputation of 2 toes on left foot  Skin: As above Psychiatry: Poor insight   Data Reviewed:   CBC: Recent Labs  Lab 02/14/2021 1110 02/10/2021 1151 03/03/2021 1234 02/18/2021 1306 02/08/21 0447 02/08/21 1438 02/09/21  0044  WBC 10.4  --   --   --  9.4 9.7 10.0  NEUTROABS 7.5  --   --   --   --   --   --   HGB 7.5*   < > 8.5* 8.8* 8.2* 8.1* 8.0*  HCT 25.4*   < > 25.0* 26.0* 28.0* 26.9* 25.7*  MCV 96.9  --   --   --  96.9 93.4 93.1  PLT 229  --   --   --  201 197 195   < > =  values in this interval not displayed.   Basic Metabolic Panel: Recent Labs  Lab 02/26/2021 1110 02/16/2021 1150 03/09/2021 1151 02/20/2021 1234 02/14/2021 1306 02/08/21 0447 02/08/21 1438 02/09/21 0044  NA 142  --    < > 144 143 140 138 137  K 3.7  --    < > 3.4* 3.5 3.7 3.2* 3.5  CL 105  --   --   --   --  103 103 100  CO2 20*  --   --   --   --  19* 24 21*  GLUCOSE 100*  --   --   --   --  98 88 92  BUN 29*  --   --   --   --  34* 16 20  CREATININE 5.54*  --   --   --   --  6.17* 3.40* 4.10*  CALCIUM 8.9  --   --   --   --  8.9 8.6* 8.3*  MG  --  1.9  --   --   --   --   --   --   PHOS  --   --   --   --   --   --  2.0*  --    < > = values in this interval not displayed.   GFR: Estimated Creatinine Clearance: 21.8 mL/min (A) (by C-G formula based on SCr of 4.1 mg/dL (H)). Liver Function Tests: Recent Labs  Lab 02/22/2021 1110 02/08/21 0447 02/08/21 1438  AST 49* 44*  --   ALT 6 7  --   ALKPHOS 89 89  --   BILITOT 1.0 1.3*  --   PROT 6.8 6.3*  --   ALBUMIN 2.3* 2.2* 2.5*   No results for input(s): LIPASE, AMYLASE in the last 168 hours. No results for input(s): AMMONIA in the last 168 hours. Coagulation Profile: Recent Labs  Lab 03/06/2021 1110  INR 1.9*   Cardiac Enzymes: No results for input(s): CKTOTAL, CKMB, CKMBINDEX, TROPONINI in the last 168 hours. BNP (last 3 results) No results for input(s): PROBNP in the last 8760 hours. HbA1C: Recent Labs    02/28/2021 1327  HGBA1C 7.2*   CBG: Recent Labs  Lab 02/08/21 1403 02/08/21 1723 02/08/21 2114 02/09/21 0753 02/09/21 1206  GLUCAP 78 115* 120* 77 125*   Lipid Profile: No results for input(s): CHOL, HDL, LDLCALC, TRIG, CHOLHDL, LDLDIRECT in the last 72 hours. Thyroid Function Tests: No results for input(s): TSH, T4TOTAL, FREET4, T3FREE, THYROIDAB in the last 72 hours. Anemia Panel: No results for input(s): VITAMINB12, FOLATE, FERRITIN, TIBC, IRON, RETICCTPCT in the last 72 hours. Urine analysis: No results  found for: COLORURINE, APPEARANCEUR, LABSPEC, PHURINE, GLUCOSEU, HGBUR, BILIRUBINUR, KETONESUR, PROTEINUR, UROBILINOGEN, NITRITE, LEUKOCYTESUR Sepsis Labs: '@LABRCNTIP'$ (procalcitonin:4,lacticidven:4)  ) Recent Results (from the past 240 hour(s))  Resp Panel by RT-PCR (Flu A&B, Covid) Nasopharyngeal Swab     Status: None   Collection  Time: 02/21/2021  9:55 AM   Specimen: Nasopharyngeal Swab; Nasopharyngeal(NP) swabs in vial transport medium  Result Value Ref Range Status   SARS Coronavirus 2 by RT PCR NEGATIVE NEGATIVE Final    Comment: (NOTE) SARS-CoV-2 target nucleic acids are NOT DETECTED.  The SARS-CoV-2 RNA is generally detectable in upper respiratory specimens during the acute phase of infection. The lowest concentration of SARS-CoV-2 viral copies this assay can detect is 138 copies/mL. A negative result does not preclude SARS-Cov-2 infection and should not be used as the sole basis for treatment or other patient management decisions. A negative result may occur with  improper specimen collection/handling, submission of specimen other than nasopharyngeal swab, presence of viral mutation(s) within the areas targeted by this assay, and inadequate number of viral copies(<138 copies/mL). A negative result must be combined with clinical observations, patient history, and epidemiological information. The expected result is Negative.  Fact Sheet for Patients:  EntrepreneurPulse.com.au  Fact Sheet for Healthcare Providers:  IncredibleEmployment.be  This test is no t yet approved or cleared by the [place] FDA and  has been authorized for detection and/or diagnosis of SARS-CoV-2 by FDA under an Emergency Use Authorization (EUA). This EUA will remain  in effect (meaning this test can be used) for the duration of the COVID-19 declaration under Section 564(b)(1) of the Act, 21 U.S.C.section 360bbb-3(b)(1), unless the authorization is terminated  or  revoked sooner.       Influenza A by PCR NEGATIVE NEGATIVE Final   Influenza B by PCR NEGATIVE NEGATIVE Final    Comment: (NOTE) The Xpert Xpress SARS-CoV-2/FLU/RSV plus assay is intended as an aid in the diagnosis of influenza from Nasopharyngeal swab specimens and should not be used as a sole basis for treatment. Nasal washings and aspirates are unacceptable for Xpert Xpress SARS-CoV-2/FLU/RSV testing.  Fact Sheet for Patients: EntrepreneurPulse.com.au  Fact Sheet for Healthcare Providers: IncredibleEmployment.be  This test is not yet approved or cleared by the [place] FDA and has been authorized for detection and/or diagnosis of SARS-CoV-2 by FDA under an Emergency Use Authorization (EUA). This EUA will remain in effect (meaning this test can be used) for the duration of the COVID-19 declaration under Section 564(b)(1) of the Act, 21 U.S.C. section 360bbb-3(b)(1), unless the authorization is terminated or revoked.  Performed at [facility], [place] [address], [place], [place] [address]   Blood Culture (routine x 2)     Status: None (Preliminary result)   Collection Time: 02/12/2021 11:10 AM   Specimen: BLOOD  Result Value Ref Range Status   Specimen Description BLOOD RIGHT ANTECUBITAL  Final   Special Requests   Final    BOTTLES DRAWN AEROBIC ONLY Blood Culture adequate volume   Culture   Final    NO GROWTH 2 DAYS Performed at [facility], [place] [address], [place],  [address]    Report Status PENDING  Incomplete     Scheduled Meds:  (feeding supplement) PROSource Plus  30 mL Oral BID BM   amiodarone  200 mg Oral BID   apixaban  5 mg Oral BID   atorvastatin  40 mg Oral QPM   chlorhexidine  15 mL Mouth Rinse BID   Chlorhexidine Gluconate Cloth  6 each Topical Q0600   cholestyramine light  4 g Oral BID   clopidogrel  75 mg Oral q AM   collagenase   Topical Daily   fentaNYL  1 patch Transdermal Q72H   ferric  citrate  210 mg  Oral BID WC   insulin aspart  0-5 Units Subcutaneous QHS   insulin aspart  0-9 Units Subcutaneous TID WC   insulin glargine-yfgn  8 Units Subcutaneous QHS   latanoprost  1 drop Both Eyes QHS   levothyroxine  25 mcg Oral QAC breakfast   mouth rinse  15 mL Mouth Rinse q12n4p   metoprolol succinate  12.5 mg Oral Daily   midodrine  10 mg Oral TID WC   multivitamin  1 tablet Oral QHS   pantoprazole  40 mg Oral Q0600   pregabalin  25 mg Oral BID   Continuous Infusions:  sodium chloride     sodium chloride     ceFEPime (MAXIPIME) IV Stopped (02/08/21 1519)   vancomycin Stopped (02/08/21 1629)     LOS: 2 days    Time spent: 97mn  PDomenic Polite MD [facility]   02/09/2021, 4:14 PM

## 2021-02-09 NOTE — Progress Notes (Signed)
CSW acknowledges consult that patient is requesting an alternate SNF closer to family in [place]. CSW unsure of what will be available due to patient needing dialysis but will follow up.   Gilmore Laroche, MSW, [facility]

## 2021-02-09 NOTE — NC FL2 (Signed)
[place] LEVEL OF CARE SCREENING TOOL     IDENTIFICATION  Patient Name: Kyle Walton Birthdate: 1956/06/15 Sex: male Admission Date (Current Location): 02/28/2021  [facility] and [place] Number:  Herbalist and Address:  [facility] [place], [place] [address], [place], [place] [address]      Provider Number: [id number]  Attending Physician Name and Address:  Domenic Polite, MD  Relative Name and Phone Number:  Dejonte Burnett,  Spouse   [phone]    Current Level of Care: Hospital Recommended Level of Care: [place] Prior Approval Number:    Date Approved/Denied:   PASRR Number: [id number]  Discharge Plan: SNF    Current Diagnoses: Patient Active Problem List   Diagnosis Date Noted   Open thigh wound, right, sequela 02/19/2021   Anemia of renal disease 01/23/2021   Acute on chronic diastolic (congestive) heart failure ([place]) 01/18/2021   Acute encephalopathy 01/18/2021   High anion gap metabolic acidosis [id number]   Hypotension 01/18/2021   Acquired hypothyroidism 01/18/2021   Dry gangrene ([place]) 01/18/2021   Sepsis, unspecified organism ([place]) 01/18/2021   Pressure injury of skin 12/11/2020   Cellulitis of right leg 12/03/2020   Severe sepsis ([place]) 12/03/2020   Aggressive behavior    CAD (coronary artery disease) 10/27/2020   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   Cardiopulmonary arrest with successful resuscitation ([place]) 10/27/2020   Diabetes mellitus with peripheral vascular disease ([place]) 10/27/2020   History of DVT (deep vein thrombosis) 10/27/2020   OSA (obstructive sleep apnea) 10/27/2020   Paroxysmal atrial fibrillation ([place]) 10/27/2020   Secondary hyperparathyroidism, renal ([place]) 10/27/2020   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 ([place]) 10/27/2020   Chronic diastolic congestive heart failure, NYHA class 4 ([place]) 10/27/2020   ESRD on hemodialysis ([place]) 10/27/2020   Impaired physical mobility 10/27/2020    Acute on chronic respiratory failure with hypoxia and hypercapnia ([place]) 10/27/2020   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 10/27/2020   History of major depression 10/27/2020   History of macular degeneration 10/27/2020   Legally blind 10/27/2020    Orientation RESPIRATION BLADDER Height & Weight     Self, Time, Situation, Place  Normal Incontinent Weight: 231 lb 4.2 oz (104.9 kg) Height:  '5\' 10"'$  (177.8 cm)  BEHAVIORAL SYMPTOMS/MOOD NEUROLOGICAL BOWEL NUTRITION STATUS      Incontinent Diet (see DC summary)  AMBULATORY STATUS COMMUNICATION OF NEEDS Skin   Extensive Assist Verbally PU Stage and Appropriate Care, Other (Comment) (Stage II on sacrum; wound on heel w/foam dressing; non-pressure wound on thigh)                       Personal Care Assistance Level of Assistance  Bathing, Feeding, Dressing Bathing Assistance: Maximum assistance Feeding assistance: Independent Dressing Assistance: Limited assistance     Functional Limitations Info  Sight, Hearing, Speech Sight Info: Impaired Hearing Info: Adequate Speech Info: Adequate    SPECIAL CARE FACTORS FREQUENCY  PT (By licensed PT), OT (By licensed OT)     PT Frequency: 5x/week OT Frequency: 5x/week            Contractures Contractures Info: Not present    Additional Factors Info  Code Status, Allergies, Isolation Precautions, Insulin Sliding Scale Code Status Info: Full Allergies Info: Morphine, Liraglutide   Insulin Sliding Scale Info: See dc summary Isolation Precautions Info: ESBL, VRE, MRSA     Current Medications (02/09/2021):  This is the current hospital  active medication list Current Facility-Administered Medications  Medication Dose Route Frequency Provider Last Rate Last Admin   (feeding supplement) PROSource Plus liquid 30 mL  30 mL Oral BID BM Valentina Gu, NP   30 mL at 02/09/21 0906   0.9 %  sodium chloride infusion  100 mL Intravenous PRN Valentina Gu, NP        0.9 %  sodium chloride infusion  100 mL Intravenous PRN Valentina Gu, NP       acetaminophen (TYLENOL) tablet 650 mg  650 mg Oral Q6H PRN Darliss Cheney, MD   650 mg at 02/08/21 2157   Or   acetaminophen (TYLENOL) suppository 650 mg  650 mg Rectal Q6H PRN Darliss Cheney, MD       alteplase (CATHFLO ACTIVASE) injection 2 mg  2 mg Intracatheter Once PRN Valentina Gu, NP       amiodarone (PACERONE) tablet 200 mg  200 mg Oral BID Darliss Cheney, MD   200 mg at 02/09/21 0906   apixaban (ELIQUIS) tablet 5 mg  5 mg Oral BID Darliss Cheney, MD   5 mg at 02/09/21 0907   atorvastatin (LIPITOR) tablet 40 mg  40 mg Oral QPM Pahwani, Einar Grad, MD   40 mg at 02/08/21 2158   ceFEPIme (MAXIPIME) 2 g in sodium chloride 0.9 % 100 mL IVPB  2 g Intravenous Q T,Th,Sa-HD Domenic Polite, MD   Stopped at 02/08/21 1519   chlorhexidine (PERIDEX) 0.12 % solution 15 mL  15 mL Mouth Rinse BID Domenic Polite, MD   15 mL at 02/09/21 0906   Chlorhexidine Gluconate Cloth 2 % PADS 6 each  6 each Topical Q0600 Domenic Polite, MD   6 each at 02/09/21 0600   cholestyramine light (PREVALITE) packet 4 g  4 g Oral BID Darliss Cheney, MD   4 g at 02/09/21 0954   clonazepam (KLONOPIN) disintegrating tablet 0.25 mg  0.25 mg Oral BID PRN Darliss Cheney, MD       clopidogrel (PLAVIX) tablet 75 mg  75 mg Oral q AM Darliss Cheney, MD   75 mg at 02/09/21 [id number]   collagenase (SANTYL) ointment   Topical Daily Sherwood Gambler, MD   Given at 03/02/2021 1422   docusate sodium (COLACE) capsule 100 mg  100 mg Oral BID PRN Darliss Cheney, MD       fentaNYL (DURAGESIC) 25 MCG/HR 1 patch  1 patch Transdermal Q72H Pahwani, Einar Grad, MD       ferric citrate (AURYXIA) tablet 210 mg  210 mg Oral BID WC Valentina Gu, NP       heparin injection 1,000 Units  1,000 Units Dialysis PRN Valentina Gu, NP       heparin injection 3,200 Units  3,200 Units Dialysis PRN Valentina Gu, NP       HYDROmorphone (DILAUDID) tablet 2 mg  2 mg Oral  Q8H PRN Darliss Cheney, MD   2 mg at 02/09/21 0754   insulin aspart (novoLOG) injection 0-5 Units  0-5 Units Subcutaneous QHS Pahwani, Ravi, MD       insulin aspart (novoLOG) injection 0-9 Units  0-9 Units Subcutaneous TID WC Pahwani, Einar Grad, MD       insulin glargine-yfgn (SEMGLEE) injection 8 Units  8 Units Subcutaneous QHS Domenic Polite, MD   8 Units at 02/08/21 2155   latanoprost (XALATAN) 0.005 % ophthalmic solution 1 drop  1 drop Both Eyes QHS Darliss Cheney, MD   1 drop at 02/08/21 2225  levothyroxine (SYNTHROID) tablet 25 mcg  25 mcg Oral QAC breakfast Darliss Cheney, MD   25 mcg at 02/09/21 0754   lidocaine (PF) (XYLOCAINE) 1 % injection 5 mL  5 mL Intradermal PRN Valentina Gu, NP       lidocaine-prilocaine (EMLA) cream 1 application  1 application Topical PRN Valentina Gu, NP       MEDLINE mouth rinse  15 mL Mouth Rinse q12n4p Domenic Polite, MD   15 mL at 02/08/21 1749   metoprolol succinate (TOPROL-XL) 24 hr tablet 12.5 mg  12.5 mg Oral Daily Darliss Cheney, MD   12.5 mg at 02/09/21 0906   midodrine (PROAMATINE) tablet 10 mg  10 mg Oral TID WC Darliss Cheney, MD   10 mg at 02/09/21 [id number]   multivitamin (RENA-VIT) tablet 1 tablet  1 tablet Oral QHS Valentina Gu, NP   1 tablet at 02/08/21 2158   nitroGLYCERIN (NITROSTAT) SL tablet 0.4 mg  0.4 mg Sublingual Q5 Min x 3 PRN Darliss Cheney, MD       ondansetron (ZOFRAN) tablet 4 mg  4 mg Oral Q6H PRN Darliss Cheney, MD       Or   ondansetron (ZOFRAN) injection 4 mg  4 mg Intravenous Q6H PRN Pahwani, Einar Grad, MD       pantoprazole (PROTONIX) EC tablet 40 mg  40 mg Oral Q0600 Darliss Cheney, MD   40 mg at 02/09/21 [id number]   pentafluoroprop-tetrafluoroeth (GEBAUERS) aerosol 1 application  1 application Topical PRN Valentina Gu, NP       polyethylene glycol (MIRALAX / GLYCOLAX) packet 17 g  17 g Oral Daily PRN Darliss Cheney, MD       pregabalin (LYRICA) capsule 25 mg  25 mg Oral BID Darliss Cheney, MD   25 mg at 02/09/21 0906    vancomycin (VANCOCIN) IVPB 1000 mg/200 mL premix  1,000 mg Intravenous Q T,Th,Sa-HD Domenic Polite, MD   Stopped at 02/08/21 1629     Discharge Medications: Please see discharge summary for a list of discharge medications.  Relevant Imaging Results:  Relevant Lab Results:   Additional Information SSN [id number]  patient has received covid vaccine and booster shot,   patient uses cpap (Adult full face mask),   HD patient @ [place] [facility] (MWF 11:45am)  Benard Halsted, LCSW

## 2021-02-09 NOTE — Progress Notes (Addendum)
[facility] Progress Note   Subjective:Seen in room, looks much better. Was able to remove 2.7 L in HD yesterday. LE edema much improved. AFib on monitor. DOES NOT WISH TO GO BACK TO PREVIOUS SNF.   HD tomorrow on schedule.   Objective Vitals:   02/08/21 2324 02/09/21 0403 02/09/21 0415 02/09/21 0748  BP: (!) 97/50 (!) 110/55  (!) 109/54  Pulse: 83 76  94  Resp: '20 12  17  '$ Temp: 99.1 F (37.3 C) (!) 97.5 F (36.4 C)  97.6 F (36.4 C)  TempSrc: Oral Axillary  Oral  SpO2: 94% 97%  96%  Weight:   104.9 kg   Height:       Physical Exam General: Chronically ill appearing male in NAD Heart: S1,S2 irreg No M/R/G Lungs: CTAB Anteriorly Abdomen: obese with edema lower abd but improved from admission Extremities: still with some LE edema today Toe amps both feet. Has Wound R lateral drsg intact. Erythema RLE improved.  Dialysis Access: [facility]     Additional Objective Labs: Basic Metabolic Panel: Recent Labs  Lab 02/08/21 0447 02/08/21 1438 02/09/21 0044  NA 140 138 137  K 3.7 3.2* 3.5  CL 103 103 100  CO2 19* 24 21*  GLUCOSE 98 88 92  BUN 34* 16 20  CREATININE 6.17* 3.40* 4.10*  CALCIUM 8.9 8.6* 8.3*  PHOS  --  2.0*  --    Liver Function Tests: Recent Labs  Lab 03/07/2021 1110 02/08/21 0447 02/08/21 1438  AST 49* 44*  --   ALT 6 7  --   ALKPHOS 89 89  --   BILITOT 1.0 1.3*  --   PROT 6.8 6.3*  --   ALBUMIN 2.3* 2.2* 2.5*   No results for input(s): LIPASE, AMYLASE in the last 168 hours. CBC: Recent Labs  Lab 02/08/2021 1110 02/26/2021 1151 02/08/21 0447 02/08/21 1438 02/09/21 0044  WBC 10.4  --  9.4 9.7 10.0  NEUTROABS 7.5  --   --   --   --   HGB 7.5*   < > 8.2* 8.1* 8.0*  HCT 25.4*   < > 28.0* 26.9* 25.7*  MCV 96.9  --  96.9 93.4 93.1  PLT 229  --  201 197 195   < > = values in this interval not displayed.   Blood Culture    Component Value Date/Time   SDES BLOOD RIGHT ANTECUBITAL 02/08/2021 1110   SPECREQUEST  02/18/2021 1110     BOTTLES DRAWN AEROBIC ONLY Blood Culture adequate volume   CULT  02/23/2021 1110    NO GROWTH 2 DAYS Performed at [facility], [place] [address] [place], [place] [address]    REPTSTATUS PENDING 02/21/2021 1110    Cardiac Enzymes: No results for input(s): CKTOTAL, CKMB, CKMBINDEX, TROPONINI in the last 168 hours. CBG: Recent Labs  Lab 02/11/2021 2204 02/08/21 1403 02/08/21 1723 02/08/21 2114 02/09/21 0753  GLUCAP 137* 78 115* 120* 77   Iron Studies: No results for input(s): IRON, TIBC, TRANSFERRIN, FERRITIN in the last 72 hours. '@lablastinr3'$ @ Studies/Results: CT FEMUR RIGHT WO CONTRAST  Result Date: 02/23/2021 CLINICAL DATA:  Right upper lateral thigh wound with drainage, altered mental status. EXAM: CT OF THE LOWER RIGHT EXTREMITY WITHOUT CONTRAST TECHNIQUE: Multidetector CT imaging of the right lower extremity was performed according to the standard protocol. COMPARISON:  12/05/2020 FINDINGS: Bones/Joint/Cartilage Considerable articular space narrowing in the right hip. No fracture or destructive bony findings. 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 Trace knee effusion. No definite hip joint effusion. Ligaments Suboptimally assessed by CT. Muscles and Tendons Worsened thickening along the superficial fascia margin of the right anterior muscular compartment laterally with some reduction in distinctness of fat planes in the vastus lateralis muscle which could indicate and are lying myositis. Soft tissues Subcutaneous edema laterally in the upper thigh and circumferentially in the mid and distal thigh and knee region. Cutaneous wound along the anterolateral upper thigh with locules of gas tracking within a band of subcutaneous density over an approximately 13.8 cm excursion for example as shown on images 19 through 30 of series 10. Substantially improved appearance of the large mixed density collection along the vastus lateralis  compared to prior, notably reduced in size and indistinctly marginated. Pelvic ascites noted. IMPRESSION: 1. Substantially reduced volume of the mixed density collection within along the right vastus lateralis compared to the 01/20/2021 exam. Locules of subcutaneous gas density track along an approximately 13 cm vertical excursion. 2. Inflammatory stranding extends down to the superficial fascia margin and there is some mild effacement of fat planes in the vastus lateralis compared to prior raising the possibility of early myositis. I am skeptical that this is leading to an overt compartment syndrome at this time given the persistence of some of the fat planes, but worsening could increase risk of compartment syndrome. 3. Trace knee effusion. 4. Degenerative loss of articular space in the right hip joint. 5. Circumferential edema around the knee and distal thigh with lateral edema along the proximal thigh, suspicious for cellulitis. 6. Pelvic ascites. Electronically Signed   By: Van Clines M.D.   On: 02/28/2021 11:08   Medications:  sodium chloride     sodium chloride     ceFEPime (MAXIPIME) IV Stopped (02/08/21 1519)   vancomycin Stopped (02/08/21 1629)    (feeding supplement) PROSource Plus  30 mL Oral BID BM   amiodarone  200 mg Oral BID   apixaban  5 mg Oral BID   atorvastatin  40 mg Oral QPM   calcium acetate  1,334 mg Oral TID WC   chlorhexidine  15 mL Mouth Rinse BID   Chlorhexidine Gluconate Cloth  6 each Topical Q0600   cholestyramine light  4 g Oral BID   clopidogrel  75 mg Oral q AM   collagenase   Topical Daily   fentaNYL  1 patch Transdermal Q72H   ferric citrate  210 mg Oral TID WC   insulin aspart  0-5 Units Subcutaneous QHS   insulin aspart  0-9 Units Subcutaneous TID WC   insulin glargine-yfgn  8 Units Subcutaneous QHS   latanoprost  1 drop Both Eyes QHS   levothyroxine  25 mcg Oral QAC breakfast   mouth rinse  15 mL Mouth Rinse q12n4p   metoprolol succinate  12.5 mg  Oral Daily   midodrine  10 mg Oral TID WC   multivitamin  1 tablet Oral QHS   pantoprazole  40 mg Oral Q0600   pregabalin  25 mg Oral BID   sevelamer carbonate  1,600 mg Oral TID WC     Dialysis Orders: Center: [facility] MWF 4 hrs F250 450/800 3.0K/2.5 Ca LIJ TDC  EDW 111 kg -Heparin 4900 units IV initial bolus, 500 units hourly, DC last hour of tx   Assessment/Plan:  R. Thigh Abscess: ABX started. Per primary- maxipime and vanc. Low grade temp overnight, WBC 10.0.   ESRD - MWF however will switch to T,Th,S D/T high MWF  census. K+ 3.7 Use 4.0 K bath. Try tight heparin tomorrow.    Hypertension/volume-Challenged yesterday successfully removed 2.7 liters. Looks much better. No LE edema but persistent edema hips/abdomen. He is willing to do the same tomorrow. Use Midodrine with extra 10 mg PO mid run. Concerned being in AFIB may impede progress.   Anemia  -HGB 8.0. Getting orders for ESA from [facility].Started Aranesp 150 mcg IV with HD 02/08/21. Follow trends.   Metabolic bone disease -  C Ca high, suspect VDRA is on hold. PO4 2.0. DC Calcium acetate, decrease auryxia to 1 tab PO BID for now.  Nutrition - Albumin low. Renal/Carb mod diet with protein supps, renal vits. DMT2-per primary Hypothyroidism-per primary H/O PAF: Was scheduled for cardioversion but this has not happened yet. Patient requests that cardiology be made aware of admission. Continue apixaban, low dose metoprolol, digoxin.   10. Disposition: Does NOT wish to return to previous SNF. Wanting SNF closer to his home in [place]. Will consult MSW.  Rita H. Brown NP-C 02/09/2021, 10:34 AM  [facility] [phone]  Patient seen and examined, agree with above note with above modifications. Seen in his room-  no new issues-  tolerated HD yest with UF that was needed.  Plan for HD tomorrow of schedule and continued appropriate titration of dialysis related medications  Corliss Parish, MD 02/09/2021

## 2021-02-10 ENCOUNTER — Ambulatory Visit (HOSPITAL_COMMUNITY): Payer: Medicare HMO | Admitting: Physician Assistant

## 2021-02-10 DIAGNOSIS — E1151 Type 2 diabetes mellitus with diabetic peripheral angiopathy without gangrene: Secondary | ICD-10-CM | POA: Diagnosis not present

## 2021-02-10 DIAGNOSIS — N186 End stage renal disease: Secondary | ICD-10-CM | POA: Diagnosis not present

## 2021-02-10 DIAGNOSIS — T148XXA Other injury of unspecified body region, initial encounter: Secondary | ICD-10-CM | POA: Diagnosis not present

## 2021-02-10 DIAGNOSIS — E039 Hypothyroidism, unspecified: Secondary | ICD-10-CM | POA: Diagnosis not present

## 2021-02-10 LAB — BASIC METABOLIC PANEL
Anion gap: 15 (ref 5–15)
BUN: 33 mg/dL — ABNORMAL HIGH (ref 8–23)
CO2: 21 mmol/L — ABNORMAL LOW (ref 22–32)
Calcium: 8.7 mg/dL — ABNORMAL LOW (ref 8.9–10.3)
Chloride: 100 mmol/L (ref 98–111)
Creatinine, Ser: 5.32 mg/dL — ABNORMAL HIGH (ref 0.61–1.24)
GFR, Estimated: 11 mL/min — ABNORMAL LOW (ref >60)
Glucose, Bld: 124 mg/dL — ABNORMAL HIGH (ref 70–99)
Potassium: 3.6 mmol/L (ref 3.5–5.1)
Sodium: 136 mmol/L (ref 135–145)

## 2021-02-10 LAB — CBC
HCT: 27.2 % — ABNORMAL LOW (ref 39.0–52.0)
Hemoglobin: 8.2 g/dL — ABNORMAL LOW (ref 13.0–17.0)
MCH: 28.2 pg (ref 26.0–34.0)
MCHC: 30.1 g/dL (ref 30.0–36.0)
MCV: 93.5 fL (ref 80.0–100.0)
Platelets: 213 10*3/uL (ref 150–400)
RBC: 2.91 MIL/uL — ABNORMAL LOW (ref 4.22–5.81)
RDW: 22.6 % — ABNORMAL HIGH (ref 11.5–15.5)
WBC: 12.9 10*3/uL — ABNORMAL HIGH (ref 4.0–10.5)
nRBC: 0 % (ref 0.0–0.2)

## 2021-02-10 LAB — GLUCOSE, CAPILLARY
Glucose-Capillary: 126 mg/dL — ABNORMAL HIGH (ref 70–99)
Glucose-Capillary: 101 mg/dL — ABNORMAL HIGH (ref 70–99)
Glucose-Capillary: 143 mg/dL — ABNORMAL HIGH (ref 70–99)

## 2021-02-10 MED ORDER — MIDODRINE HCL 5 MG PO TABS
10.0000 mg | ORAL_TABLET | Freq: Once | ORAL | Status: AC
  Administered 2021-02-10: 10 mg via ORAL

## 2021-02-10 MED ORDER — HEPARIN SODIUM (PORCINE) 1000 UNIT/ML DIALYSIS: 1000.0000 [IU] | INTRAMUSCULAR | Status: DC | PRN

## 2021-02-10 MED ORDER — MIDODRINE HCL 5 MG PO TABS: 10.0000 mg | ORAL_TABLET | Freq: Three times a day (TID) | ORAL | Status: DC

## 2021-02-10 MED ORDER — SODIUM CHLORIDE 0.9 % IV SOLN
100.0000 mL | INTRAVENOUS | Status: DC | PRN
Start: 2021-02-10 — End: 2021-02-10

## 2021-02-10 MED ORDER — HEPARIN SODIUM (PORCINE) 1000 UNIT/ML DIALYSIS
20.0000 [IU]/kg | INTRAMUSCULAR | Status: DC | PRN
  Administered 2021-02-10: 2100 [IU] via INTRAVENOUS_CENTRAL

## 2021-02-10 MED ORDER — SODIUM CHLORIDE 0.9 % IV SOLN: 100.0000 mL | INTRAVENOUS | Status: DC | PRN

## 2021-02-10 NOTE — Progress Notes (Signed)
Placed patient on bipap for the night with IPAP set at 12cm and EPAP set at 6cm.

## 2021-02-10 NOTE — Progress Notes (Signed)
CSW has heard from all of the SNFs in [facility] and none are able to accept patient. Currently there are no other facilities in [place] that would transport to dialysis in [place]. CSW continuing to make calls and is checking with Dustin Flock.   Gilmore Laroche, MSW, [facility]

## 2021-02-10 NOTE — Progress Notes (Signed)
Pharmacy Antibiotic Note  Kyle Walton is a 65 y.o. male admitted on 02/21/2021 with  wound infection .  Pharmacy has been consulted for Vanco/Cefepime dosing.   ID: Vanc/cefepime for sepsis/R thigh wound - Afebrile, WBC 12.9 up - 7/14: I&D  Vanc 8/1>> Cefepime 8/1>>  Bcx 8/1: ngtd  7/14: R thigh: Serratia R to Ancef  Plan: Vanc 2gm IV x 1 then 1gm Q TTS Cefepime 2gm IV x 1 then 2g Q TTS Would recommend narrowing to Cefepime alone.    Height: '5\' 10"'$  (177.8 cm) Weight: 104.9 kg (231 lb 4.2 oz) IBW/kg (Calculated) : 73  Temp (24hrs), Avg:97.9 F (36.6 C), Min:97.6 F (36.4 C), Max:98 F (36.7 C)  Recent Labs  Lab 03/09/2021 1110 02/23/2021 1428 02/08/21 0447 02/08/21 1438 02/09/21 0044 02/10/21 0232  WBC 10.4  --  9.4 9.7 10.0 12.9*  CREATININE 5.54*  --  6.17* 3.40* 4.10* 5.32*  LATICACIDVEN 1.1 1.0  --   --   --   --     Estimated Creatinine Clearance: 16.8 mL/min (A) (by C-G formula based on SCr of 5.32 mg/dL (H)).    Allergies  Allergen Reactions   Morphine Other (See Comments)    Per son, [name], pt becomes unresponsive/disoriented- especially when given after HD tx AND "ALLERGIC," per MAR   Liraglutide Diarrhea and Other (See Comments)    "Phenol"...."ALLERGIC," per [facility]      Thank you for allowing pharmacy to be a part of this patient's care. Mikita Lesmeister S. Alford Highland, PharmD, BCPS Clinical Staff Pharmacist [email or web address] [place] 02/10/2021 10:02 AM

## 2021-02-10 NOTE — Discharge Instructions (Signed)

## 2021-02-10 NOTE — Progress Notes (Signed)
[facility] Progress Note   Subjective: Seen in room, no new issues but seems to be in more pain today - HD planned for later today   Objective Vitals:   02/09/21 1608 02/09/21 2039 02/10/21 0050 02/10/21 0437  BP: (!) 98/55 121/66 120/68 113/66  Pulse: 93 89 86 85  Resp:  '15 15 16  '$ Temp: 97.6 F (36.4 C) 97.8 F (36.6 C) 97.9 F (36.6 C) 98 F (36.7 C)  TempSrc: Oral Axillary Axillary Axillary  SpO2: 94% 97% 98% 97%  Weight:      Height:       Physical Exam General: Chronically ill appearing male in NAD Heart: S1,S2 irreg No M/R/G Lungs: CTAB Anteriorly Abdomen: obese with edema lower abd but improved from admission Extremities: still with some LE edema today Toe amps both feet. Has Wound R lateral drsg intact. Erythema RLE improved.  Dialysis Access: [facility]     Additional Objective Labs: Basic Metabolic Panel: Recent Labs  Lab 02/08/21 1438 02/09/21 0044 02/10/21 0232  NA 138 137 136  K 3.2* 3.5 3.6  CL 103 100 100  CO2 24 21* 21*  GLUCOSE 88 92 124*  BUN 16 20 33*  CREATININE 3.40* 4.10* 5.32*  CALCIUM 8.6* 8.3* 8.7*  PHOS 2.0*  --   --    Liver Function Tests: Recent Labs  Lab 02/16/2021 1110 02/08/21 0447 02/08/21 1438  AST 49* 44*  --   ALT 6 7  --   ALKPHOS 89 89  --   BILITOT 1.0 1.3*  --   PROT 6.8 6.3*  --   ALBUMIN 2.3* 2.2* 2.5*   No results for input(s): LIPASE, AMYLASE in the last 168 hours. CBC: Recent Labs  Lab 03/09/2021 1110 02/16/2021 1151 02/08/21 0447 02/08/21 1438 02/09/21 0044 02/10/21 0232  WBC 10.4  --  9.4 9.7 10.0 12.9*  NEUTROABS 7.5  --   --   --   --   --   HGB 7.5*   < > 8.2* 8.1* 8.0* 8.2*  HCT 25.4*   < > 28.0* 26.9* 25.7* 27.2*  MCV 96.9  --  96.9 93.4 93.1 93.5  PLT 229  --  201 197 195 213   < > = values in this interval not displayed.   Blood Culture    Component Value Date/Time   SDES BLOOD RIGHT ANTECUBITAL 02/14/2021 1110   SPECREQUEST  03/04/2021 1110    BOTTLES DRAWN AEROBIC ONLY  Blood Culture adequate volume   CULT  02/18/2021 1110    NO GROWTH 3 DAYS Performed at [facility], [place] [address] [place], [place] [address]    REPTSTATUS PENDING 02/09/2021 1110    Cardiac Enzymes: No results for input(s): CKTOTAL, CKMB, CKMBINDEX, TROPONINI in the last 168 hours. CBG: Recent Labs  Lab 02/09/21 0753 02/09/21 1206 02/09/21 1614 02/09/21 1948 02/10/21 0817  GLUCAP 77 125* 176* 159* 126*   Iron Studies: No results for input(s): IRON, TIBC, TRANSFERRIN, FERRITIN in the last 72 hours. '@lablastinr3'$ @ Studies/Results: No results found. Medications:  sodium chloride     sodium chloride     ceFEPime (MAXIPIME) IV Stopped (02/08/21 1519)   vancomycin Stopped (02/08/21 1629)    (feeding supplement) PROSource Plus  30 mL Oral BID BM   amiodarone  200 mg Oral BID   apixaban  5 mg Oral BID   atorvastatin  40 mg Oral QPM   chlorhexidine  15 mL Mouth Rinse BID   Chlorhexidine Gluconate Cloth  6 each Topical Q0600   cholestyramine light  4 g Oral BID   clopidogrel  75 mg Oral q AM   collagenase   Topical Daily   fentaNYL  1 patch Transdermal Q72H   ferric citrate  210 mg Oral BID WC   insulin aspart  0-5 Units Subcutaneous QHS   insulin aspart  0-9 Units Subcutaneous TID WC   insulin glargine-yfgn  8 Units Subcutaneous QHS   latanoprost  1 drop Both Eyes QHS   levothyroxine  25 mcg Oral QAC breakfast   mouth rinse  15 mL Mouth Rinse q12n4p   metoprolol succinate  12.5 mg Oral Daily   midodrine  10 mg Oral TID WC   multivitamin  1 tablet Oral QHS   pantoprazole  40 mg Oral Q0600   pregabalin  25 mg Oral BID     Dialysis Orders: Center: [facility] MWF 4 hrs F250 450/800 3.0K/2.5 Ca LIJ TDC  EDW 111 kg -Heparin 4900 units IV initial bolus, 500 units hourly, DC last hour of tx   Assessment/Plan:  R. Thigh Abscess: ABX started. Per primary- maxipime and vanc. Fever curve improved  ESRD - MWF however will switch to T,Th,S D/T high MWF census. K+ 3.7 Use 4.0  K bath. Try tight heparin today.    Hypertension/volume-Challenged yesterday successfully removed 2.7 liters. Looks much better. No LE edema but persistent edema hips/abdomen. He is willing to do the same tomorrow. Use Midodrine with extra 10 mg PO mid run. Concerned being in AFIB may impede progress.   Anemia  -HGB 8.0. Started Aranesp 150 mcg IV with HD 02/08/21. Follow trends.   Metabolic bone disease -  C Ca high, VDRA is on hold. PO4 2.0. DC Calcium acetate, decrease auryxia to 1 tab PO BID for now.  Nutrition - Albumin low. Renal/Carb mod diet with protein supps, renal vits. DMT2-per primary Hypothyroidism-per primary H/O PAF: Was scheduled for cardioversion but this has not happened yet. Patient requests that cardiology be made aware of admission. Continue apixaban, low dose metoprolol, digoxin.   10. Disposition: Does NOT wish to return to previous SNF. Wanting SNF closer to his home in [place]. Will consult MSW.  Corliss Parish, MD 02/10/2021

## 2021-02-10 NOTE — Progress Notes (Signed)
Pt arrived to HD unit from room 5W18  Drowsy, a little confused intermittently, but able to orient, understands situation.   Removed: 2845 mL  Meds: Vanco Cefepime Midodrine Heparin   Completed tx without problems

## 2021-02-10 NOTE — Progress Notes (Signed)
PROGRESS NOTE    Kyle Walton  [id number] DOB: [birth year] DOA: 03/05/2021 PCP: Patient, No Pcp Per (Inactive)  Brief Narrative: 65 year old male chronically ill with ESRD, anemia of chronic disease, type 2 diabetes mellitus, chronic diastolic CHF, paroxysmal A. fib on Eliquis, COPD/chronic hypoxic respiratory failure on 2 L home O2, nocturnal BiPAP, hypothyroidism, recently discharged from [facility] 7/22, treated for right thigh abscess, underwent I&D on 7/14,, completed 7 days of IV therapy inpatient and discharged to SNF on oral ciprofloxacin, which she allegedly may not have gotten.  Was seen by the wound nurse on 8/1, noted to have increased swelling tenderness and drainage from the wound, subsequently sent to the ED. -In the emergency room was briefly hypotensive, subsequently stabilized, started on broad-spectrum antibiotics   Assessment & Plan:   Right thigh abscess -Recent I&D on 7/14, cultures grew Serratia resistant to Ancef -Now admitted with clinical worsening, CT with some concern for developing compartment syndrome, reduced abscess volume, myositis -Appreciate orthopedics input, sutures removed to allow drainage,  -Has moderate amount of slough in his wound bed, will ask orthopedics to reassess tomorrow, mild worsening of leukocytosis noted - may need repeat debridement if poor clinical response  ESRD on hemodialysis -Nephrology consulting, dialysis today  Type 2 diabetes mellitus -CBG stable, continue current dose of Lantus  Chronic hypotension -Continue midodrine  Chronic diastolic CHF -Volume managed with HD  CAD/CABG -Continue beta-blocker, Plavix and statin  Paroxysmal atrial fibrillation -Continue Toprol, amiodarone, Eliquis  COPD/chronic respiratory failure -Resume BiPAP nightly  History of PAD -Prior toe amputation and metatarsal amputation -Continue Plavix, statin  DVT prophylaxis: Apixaban Code Status: Full code Family Communication: Discussed  patient in detail, no family at bedside Disposition Plan:  Status is: Inpatient  Remains inpatient appropriate because:Inpatient level of care appropriate due to severity of illness  Dispo: The patient is from: SNF              Anticipated d/c is to: SNF              Patient currently is not medically stable to d/c.   Difficult to place patient No   \ Consultants:  Orthopedics Nephrology  Procedures:   Antimicrobials:    Subjective: -Feels better today, continues to have discomfort in his right thigh, getting dressing changes  Objective: Vitals:   02/10/21 1300 02/10/21 1330 02/10/21 1400 02/10/21 1430  BP: 102/62 (!) 96/57 (!) 108/53 105/61  Pulse: 87 78 81 87  Resp: '18 16 18 20  '$ Temp:      TempSrc:      SpO2: 99% 97% 96% 99%  Weight:      Height:       No intake or output data in the 24 hours ending 02/10/21 1442  Filed Weights   02/08/21 1246 02/09/21 0415 02/10/21 1055  Weight: 101.2 kg 104.9 kg 102.7 kg    Examination:  General exam: Chronically ill male appears much older than stated age, AAO x2, mild cognitive deficits HEENT: Left IJ HD catheter noted CVS: S1-S2, regular rate rhythm Lungs: Decreased breath sounds bases Abdomen: Soft, nontender, bowel sounds present  Extremities: Right thigh wound with slough surrounding erythema, tenderness, proximal induration, prior transmetatarsal amputation of right foot and amputation of 2 toes on left foot  Skin: As above Psychiatry: Poor insight   Data Reviewed:   CBC: Recent Labs  Lab 02/26/2021 1110 02/16/2021 1151 02/23/2021 1306 02/08/21 0447 02/08/21 1438 02/09/21 0044 02/10/21 0232  WBC 10.4  --   --  9.4 9.7 10.0 12.9*  NEUTROABS 7.5  --   --   --   --   --   --   HGB 7.5*   < > 8.8* 8.2* 8.1* 8.0* 8.2*  HCT 25.4*   < > 26.0* 28.0* 26.9* 25.7* 27.2*  MCV 96.9  --   --  96.9 93.4 93.1 93.5  PLT 229  --   --  201 197 195 213   < > = values in this interval not displayed.   Basic Metabolic  Panel: Recent Labs  Lab 02/24/2021 1110 02/21/2021 1150 02/10/2021 1151 03/08/2021 1306 02/08/21 0447 02/08/21 1438 02/09/21 0044 02/10/21 0232  NA 142  --    < > 143 140 138 137 136  K 3.7  --    < > 3.5 3.7 3.2* 3.5 3.6  CL 105  --   --   --  103 103 100 100  CO2 20*  --   --   --  19* 24 21* 21*  GLUCOSE 100*  --   --   --  98 88 92 124*  BUN 29*  --   --   --  34* 16 20 33*  CREATININE 5.54*  --   --   --  6.17* 3.40* 4.10* 5.32*  CALCIUM 8.9  --   --   --  8.9 8.6* 8.3* 8.7*  MG  --  1.9  --   --   --   --   --   --   PHOS  --   --   --   --   --  2.0*  --   --    < > = values in this interval not displayed.   GFR: Estimated Creatinine Clearance: 16.6 mL/min (A) (by C-G formula based on SCr of 5.32 mg/dL (H)). Liver Function Tests: Recent Labs  Lab 02/18/2021 1110 02/08/21 0447 02/08/21 1438  AST 49* 44*  --   ALT 6 7  --   ALKPHOS 89 89  --   BILITOT 1.0 1.3*  --   PROT 6.8 6.3*  --   ALBUMIN 2.3* 2.2* 2.5*   No results for input(s): LIPASE, AMYLASE in the last 168 hours. No results for input(s): AMMONIA in the last 168 hours. Coagulation Profile: Recent Labs  Lab 03/08/2021 1110  INR 1.9*   Cardiac Enzymes: No results for input(s): CKTOTAL, CKMB, CKMBINDEX, TROPONINI in the last 168 hours. BNP (last 3 results) No results for input(s): PROBNP in the last 8760 hours. HbA1C: No results for input(s): HGBA1C in the last 72 hours.  CBG: Recent Labs  Lab 02/09/21 0753 02/09/21 1206 02/09/21 1614 02/09/21 1948 02/10/21 0817  GLUCAP 77 125* 176* 159* 126*   Lipid Profile: No results for input(s): CHOL, HDL, LDLCALC, TRIG, CHOLHDL, LDLDIRECT in the last 72 hours. Thyroid Function Tests: No results for input(s): TSH, T4TOTAL, FREET4, T3FREE, THYROIDAB in the last 72 hours. Anemia Panel: No results for input(s): VITAMINB12, FOLATE, FERRITIN, TIBC, IRON, RETICCTPCT in the last 72 hours. Urine analysis: No results found for: COLORURINE, APPEARANCEUR, LABSPEC,  PHURINE, GLUCOSEU, HGBUR, BILIRUBINUR, KETONESUR, PROTEINUR, UROBILINOGEN, NITRITE, LEUKOCYTESUR Sepsis Labs: '@LABRCNTIP'$ (procalcitonin:4,lacticidven:4)  ) Recent Results (from the past 240 hour(s))  Resp Panel by RT-PCR (Flu A&B, Covid) Nasopharyngeal Swab     Status: None   Collection Time: 03/08/2021  9:55 AM   Specimen: Nasopharyngeal Swab; Nasopharyngeal(NP) swabs in vial transport medium  Result Value Ref Range Status   SARS Coronavirus 2 by RT  PCR NEGATIVE NEGATIVE Final    Comment: (NOTE) SARS-CoV-2 target nucleic acids are NOT DETECTED.  The SARS-CoV-2 RNA is generally detectable in upper respiratory specimens during the acute phase of infection. The lowest concentration of SARS-CoV-2 viral copies this assay can detect is 138 copies/mL. A negative result does not preclude SARS-Cov-2 infection and should not be used as the sole basis for treatment or other patient management decisions. A negative result may occur with  improper specimen collection/handling, submission of specimen other than nasopharyngeal swab, presence of viral mutation(s) within the areas targeted by this assay, and inadequate number of viral copies(<138 copies/mL). A negative result must be combined with clinical observations, patient history, and epidemiological information. The expected result is Negative.  Fact Sheet for Patients:  EntrepreneurPulse.com.au  Fact Sheet for Healthcare Providers:  IncredibleEmployment.be  This test is no t yet approved or cleared by the [place] FDA and  has been authorized for detection and/or diagnosis of SARS-CoV-2 by FDA under an Emergency Use Authorization (EUA). This EUA will remain  in effect (meaning this test can be used) for the duration of the COVID-19 declaration under Section 564(b)(1) of the Act, 21 U.S.C.section 360bbb-3(b)(1), unless the authorization is terminated  or revoked sooner.       Influenza A by PCR  NEGATIVE NEGATIVE Final   Influenza B by PCR NEGATIVE NEGATIVE Final    Comment: (NOTE) The Xpert Xpress SARS-CoV-2/FLU/RSV plus assay is intended as an aid in the diagnosis of influenza from Nasopharyngeal swab specimens and should not be used as a sole basis for treatment. Nasal washings and aspirates are unacceptable for Xpert Xpress SARS-CoV-2/FLU/RSV testing.  Fact Sheet for Patients: EntrepreneurPulse.com.au  Fact Sheet for Healthcare Providers: IncredibleEmployment.be  This test is not yet approved or cleared by the [place] FDA and has been authorized for detection and/or diagnosis of SARS-CoV-2 by FDA under an Emergency Use Authorization (EUA). This EUA will remain in effect (meaning this test can be used) for the duration of the COVID-19 declaration under Section 564(b)(1) of the Act, 21 U.S.C. section 360bbb-3(b)(1), unless the authorization is terminated or revoked.  Performed at [facility], [place] [address] [place], [place] [address]   Blood Culture (routine x 2)     Status: None (Preliminary result)   Collection Time: 03/04/2021 11:10 AM   Specimen: BLOOD  Result Value Ref Range Status   Specimen Description BLOOD RIGHT ANTECUBITAL  Final   Special Requests   Final    BOTTLES DRAWN AEROBIC ONLY Blood Culture adequate volume   Culture   Final    NO GROWTH 3 DAYS Performed at [facility], [place] [address], [place], [place] [address]    Report Status PENDING  Incomplete     Scheduled Meds:  (feeding supplement) PROSource Plus  30 mL Oral BID BM   amiodarone  200 mg Oral BID   apixaban  5 mg Oral BID   atorvastatin  40 mg Oral QPM   chlorhexidine  15 mL Mouth Rinse BID   Chlorhexidine Gluconate Cloth  6 each Topical Q0600   cholestyramine light  4 g Oral BID   clopidogrel  75 mg Oral q AM   collagenase   Topical Daily   fentaNYL  1 patch Transdermal Q72H   ferric citrate  210 mg Oral BID WC   insulin  aspart  0-5 Units Subcutaneous QHS   insulin aspart  0-9 Units Subcutaneous TID WC   insulin glargine-yfgn  8 Units  Subcutaneous QHS   latanoprost  1 drop Both Eyes QHS   levothyroxine  25 mcg Oral QAC breakfast   mouth rinse  15 mL Mouth Rinse q12n4p   metoprolol succinate  12.5 mg Oral Daily   midodrine  10 mg Oral TID WC   multivitamin  1 tablet Oral QHS   pantoprazole  40 mg Oral Q0600   pregabalin  25 mg Oral BID   Continuous Infusions:  sodium chloride     sodium chloride     sodium chloride     sodium chloride     ceFEPime (MAXIPIME) IV Stopped (02/08/21 1519)   vancomycin 1,000 mg (02/10/21 1405)     LOS: 3 days   Time spent: 42mn  PDomenic Polite MD [facility]   02/10/2021, 2:42 PM

## 2021-02-11 DIAGNOSIS — N186 End stage renal disease: Secondary | ICD-10-CM | POA: Diagnosis not present

## 2021-02-11 DIAGNOSIS — E039 Hypothyroidism, unspecified: Secondary | ICD-10-CM | POA: Diagnosis not present

## 2021-02-11 DIAGNOSIS — E1151 Type 2 diabetes mellitus with diabetic peripheral angiopathy without gangrene: Secondary | ICD-10-CM | POA: Diagnosis not present

## 2021-02-11 DIAGNOSIS — T148XXA Other injury of unspecified body region, initial encounter: Secondary | ICD-10-CM | POA: Diagnosis not present

## 2021-02-11 LAB — CBC
HCT: 29.3 % — ABNORMAL LOW (ref 39.0–52.0)
Hemoglobin: 9 g/dL — ABNORMAL LOW (ref 13.0–17.0)
MCH: 28.4 pg (ref 26.0–34.0)
MCHC: 30.7 g/dL (ref 30.0–36.0)
MCV: 92.4 fL (ref 80.0–100.0)
Platelets: 207 10*3/uL (ref 150–400)
RBC: 3.17 MIL/uL — ABNORMAL LOW (ref 4.22–5.81)
RDW: 22.5 % — ABNORMAL HIGH (ref 11.5–15.5)
WBC: 13.2 10*3/uL — ABNORMAL HIGH (ref 4.0–10.5)
nRBC: 0 % (ref 0.0–0.2)

## 2021-02-11 LAB — GLUCOSE, CAPILLARY
Glucose-Capillary: 127 mg/dL — ABNORMAL HIGH (ref 70–99)
Glucose-Capillary: 169 mg/dL — ABNORMAL HIGH (ref 70–99)
Glucose-Capillary: 137 mg/dL — ABNORMAL HIGH (ref 70–99)
Glucose-Capillary: 130 mg/dL — ABNORMAL HIGH (ref 70–99)

## 2021-02-11 MED ORDER — SODIUM CHLORIDE 0.9 % IV BOLUS
250.0000 mL | Freq: Once | INTRAVENOUS | Status: AC
  Administered 2021-02-11: 250 mL via INTRAVENOUS

## 2021-02-11 NOTE — Care Management Important Message (Signed)
Important Message  Patient Details  Name: Kyle Walton MRN: [id number] Date of Birth: Sep 08, 1955   Medicare Important Message Given:  Yes     Curties [address]ig[address] 02/11/2021, 8:12 AM

## 2021-02-11 NOTE — Progress Notes (Signed)
PROGRESS NOTE    Kyle Walton  [id number] DOB: [birth year] DOA: 02/09/2021 PCP: Patient, No Pcp Per (Inactive)  Brief Narrative: 65 year old male chronically ill with ESRD, anemia of chronic disease, type 2 diabetes mellitus, chronic diastolic CHF, paroxysmal A. fib on Eliquis, COPD/chronic hypoxic respiratory failure on 2 L home O2, nocturnal BiPAP, hypothyroidism, recently discharged from [facility] 7/22, treated for right thigh abscess, underwent I&D on 7/14,, completed 7 days of IV therapy inpatient and discharged to SNF on oral ciprofloxacin, which she allegedly may not have gotten.  Was seen by the wound nurse on 8/1, noted to have increased swelling tenderness and drainage from the wound, subsequently sent to the ED. -In the emergency room was briefly hypotensive, subsequently stabilized, started on broad-spectrum antibiotics   Assessment & Plan:   Right thigh abscess -Recent I&D on 7/14, cultures grew Serratia resistant to Ancef -Now admitted with clinical worsening, CT with some concern for developing compartment syndrome, reduced abscess volume, myositis -Appreciate orthopedics input, sutures removed to allow drainage,  -Has considerable amount of slough in the wound bed, will ask Ortho to reassess, mild worsening of leukocytosis noted, may need repeat I&D  ESRD on hemodialysis -Nephrology consulting, dialysis TTS now due to high census in MWF schedule  Type 2 diabetes mellitus -CBG stable, continue current dose of Lantus  Chronic hypotension -Continue midodrine  Chronic diastolic CHF -Volume managed with HD  CAD/CABG -Continue beta-blocker, Plavix and statin  Paroxysmal atrial fibrillation -Continue Toprol, amiodarone, Eliquis  COPD/chronic respiratory failure -Resume BiPAP nightly  History of PAD -Prior toe amputation and metatarsal amputation -Continue Plavix, statin  DVT prophylaxis: Apixaban Code Status: Full code Family Communication: Discussed patient in  detail, no family at bedside Disposition Plan:  Status is: Inpatient  Remains inpatient appropriate because:Inpatient level of care appropriate due to severity of illness  Dispo: The patient is from: SNF              Anticipated d/c is to: SNF              Patient currently is not medically stable to d/c.   Difficult to place patient No   \ Consultants:  Orthopedics Nephrology  Procedures:   Antimicrobials:    Subjective: -Some confusion this morning, denies any specific complaints, some discomfort in his thigh  Objective: Vitals:   02/11/21 0000 02/11/21 0330 02/11/21 0818 02/11/21 1203  BP: 129/65 135/67 127/67 (!) 105/53  Pulse: 84 83 89 92  Resp: '19 16 19 16  '$ Temp: 98.9 F (37.2 C) 98.7 F (37.1 C) 97.6 F (36.4 C) 97.8 F (36.6 C)  TempSrc: Axillary Axillary Oral Oral  SpO2: 94% 95% 97% 94%  Weight:      Height:        Intake/Output Summary (Last 24 hours) at 02/11/2021 1409 Last data filed at 02/10/2021 1530 Gross per 24 hour  Intake --  Output 2845 ml  Net -2845 ml    Filed Weights   02/09/21 0415 02/10/21 1055 02/10/21 1545  Weight: 104.9 kg 102.7 kg 99.4 kg    Examination:  General exam: Chronically ill male appears older than stated age, AAO x2, mild cognitive deficits HEENT: Left IJ HD catheter noted CVS: S1-S2, regular rate rhythm Lungs: Decreased breath sounds to bases Abdomen: Soft, nontender, bowel sounds present  Extremities: Right thigh wound with slough surrounding erythema, tenderness, proximal induration, prior transmetatarsal amputation of right foot and amputation of 2 toes on left foot  Skin: As above Psychiatry: Poor insight  Data Reviewed:   CBC: Recent Labs  Lab 02/28/2021 1110 02/12/2021 1151 02/08/21 0447 02/08/21 1438 02/09/21 0044 02/10/21 0232 02/11/21 0116  WBC 10.4  --  9.4 9.7 10.0 12.9* 13.2*  NEUTROABS 7.5  --   --   --   --   --   --   HGB 7.5*   < > 8.2* 8.1* 8.0* 8.2* 9.0*  HCT 25.4*   < > 28.0* 26.9*  25.7* 27.2* 29.3*  MCV 96.9  --  96.9 93.4 93.1 93.5 92.4  PLT 229  --  201 197 195 213 207   < > = values in this interval not displayed.   Basic Metabolic Panel: Recent Labs  Lab 02/28/2021 1110 02/10/2021 1150 02/22/2021 1151 02/09/2021 1306 02/08/21 0447 02/08/21 1438 02/09/21 0044 02/10/21 0232  NA 142  --    < > 143 140 138 137 136  K 3.7  --    < > 3.5 3.7 3.2* 3.5 3.6  CL 105  --   --   --  103 103 100 100  CO2 20*  --   --   --  19* 24 21* 21*  GLUCOSE 100*  --   --   --  98 88 92 124*  BUN 29*  --   --   --  34* 16 20 33*  CREATININE 5.54*  --   --   --  6.17* 3.40* 4.10* 5.32*  CALCIUM 8.9  --   --   --  8.9 8.6* 8.3* 8.7*  MG  --  1.9  --   --   --   --   --   --   PHOS  --   --   --   --   --  2.0*  --   --    < > = values in this interval not displayed.   GFR: Estimated Creatinine Clearance: 16.4 mL/min (A) (by C-G formula based on SCr of 5.32 mg/dL (H)). Liver Function Tests: Recent Labs  Lab 02/15/2021 1110 02/08/21 0447 02/08/21 1438  AST 49* 44*  --   ALT 6 7  --   ALKPHOS 89 89  --   BILITOT 1.0 1.3*  --   PROT 6.8 6.3*  --   ALBUMIN 2.3* 2.2* 2.5*   No results for input(s): LIPASE, AMYLASE in the last 168 hours. No results for input(s): AMMONIA in the last 168 hours. Coagulation Profile: Recent Labs  Lab 02/18/2021 1110  INR 1.9*   Cardiac Enzymes: No results for input(s): CKTOTAL, CKMB, CKMBINDEX, TROPONINI in the last 168 hours. BNP (last 3 results) No results for input(s): PROBNP in the last 8760 hours. HbA1C: No results for input(s): HGBA1C in the last 72 hours.  CBG: Recent Labs  Lab 02/10/21 0817 02/10/21 1707 02/10/21 2110 02/11/21 0816 02/11/21 1211  GLUCAP 126* 101* 143* 127* 169*   Lipid Profile: No results for input(s): CHOL, HDL, LDLCALC, TRIG, CHOLHDL, LDLDIRECT in the last 72 hours. Thyroid Function Tests: No results for input(s): TSH, T4TOTAL, FREET4, T3FREE, THYROIDAB in the last 72 hours. Anemia Panel: No results for  input(s): VITAMINB12, FOLATE, FERRITIN, TIBC, IRON, RETICCTPCT in the last 72 hours. Urine analysis: No results found for: COLORURINE, APPEARANCEUR, LABSPEC, PHURINE, GLUCOSEU, HGBUR, BILIRUBINUR, KETONESUR, PROTEINUR, UROBILINOGEN, NITRITE, LEUKOCYTESUR Sepsis Labs: '@LABRCNTIP'$ (procalcitonin:4,lacticidven:4)  ) Recent Results (from the past 240 hour(s))  Resp Panel by RT-PCR (Flu A&B, Covid) Nasopharyngeal Swab     Status: None   Collection Time: 02/24/2021  9:55 AM   Specimen: Nasopharyngeal Swab; Nasopharyngeal(NP) swabs in vial transport medium  Result Value Ref Range Status   SARS Coronavirus 2 by RT PCR NEGATIVE NEGATIVE Final    Comment: (NOTE) SARS-CoV-2 target nucleic acids are NOT DETECTED.  The SARS-CoV-2 RNA is generally detectable in upper respiratory specimens during the acute phase of infection. The lowest concentration of SARS-CoV-2 viral copies this assay can detect is 138 copies/mL. A negative result does not preclude SARS-Cov-2 infection and should not be used as the sole basis for treatment or other patient management decisions. A negative result may occur with  improper specimen collection/handling, submission of specimen other than nasopharyngeal swab, presence of viral mutation(s) within the areas targeted by this assay, and inadequate number of viral copies(<138 copies/mL). A negative result must be combined with clinical observations, patient history, and epidemiological information. The expected result is Negative.  Fact Sheet for Patients:  EntrepreneurPulse.com.au  Fact Sheet for Healthcare Providers:  IncredibleEmployment.be  This test is no t yet approved or cleared by the [place] FDA and  has been authorized for detection and/or diagnosis of SARS-CoV-2 by FDA under an Emergency Use Authorization (EUA). This EUA will remain  in effect (meaning this test can be used) for the duration of the COVID-19 declaration  under Section 564(b)(1) of the Act, 21 U.S.C.section 360bbb-3(b)(1), unless the authorization is terminated  or revoked sooner.       Influenza A by PCR NEGATIVE NEGATIVE Final   Influenza B by PCR NEGATIVE NEGATIVE Final    Comment: (NOTE) The Xpert Xpress SARS-CoV-2/FLU/RSV plus assay is intended as an aid in the diagnosis of influenza from Nasopharyngeal swab specimens and should not be used as a sole basis for treatment. Nasal washings and aspirates are unacceptable for Xpert Xpress SARS-CoV-2/FLU/RSV testing.  Fact Sheet for Patients: EntrepreneurPulse.com.au  Fact Sheet for Healthcare Providers: IncredibleEmployment.be  This test is not yet approved or cleared by the [place] FDA and has been authorized for detection and/or diagnosis of SARS-CoV-2 by FDA under an Emergency Use Authorization (EUA). This EUA will remain in effect (meaning this test can be used) for the duration of the COVID-19 declaration under Section 564(b)(1) of the Act, 21 U.S.C. section 360bbb-3(b)(1), unless the authorization is terminated or revoked.  Performed at [facility], [place] [address] [place], [place] [address]   Blood Culture (routine x 2)     Status: None (Preliminary result)   Collection Time: 02/24/2021 11:10 AM   Specimen: BLOOD  Result Value Ref Range Status   Specimen Description BLOOD RIGHT ANTECUBITAL  Final   Special Requests   Final    BOTTLES DRAWN AEROBIC ONLY Blood Culture adequate volume   Culture   Final    NO GROWTH 4 DAYS Performed at [facility], [place] [address], [place], [place] [address]    Report Status PENDING  Incomplete     Scheduled Meds:  (feeding supplement) PROSource Plus  30 mL Oral BID BM   amiodarone  200 mg Oral BID   apixaban  5 mg Oral BID   atorvastatin  40 mg Oral QPM   chlorhexidine  15 mL Mouth Rinse BID   Chlorhexidine Gluconate Cloth  6 each Topical Q0600   cholestyramine light  4 g Oral  BID   clopidogrel  75 mg Oral q AM   collagenase   Topical Daily   fentaNYL  1 patch Transdermal Q72H   ferric citrate  210 mg Oral BID WC  insulin aspart  0-5 Units Subcutaneous QHS   insulin aspart  0-9 Units Subcutaneous TID WC   insulin glargine-yfgn  8 Units Subcutaneous QHS   latanoprost  1 drop Both Eyes QHS   levothyroxine  25 mcg Oral QAC breakfast   mouth rinse  15 mL Mouth Rinse q12n4p   metoprolol succinate  12.5 mg Oral Daily   midodrine  10 mg Oral TID WC   multivitamin  1 tablet Oral QHS   pantoprazole  40 mg Oral Q0600   pregabalin  25 mg Oral BID   Continuous Infusions:  ceFEPime (MAXIPIME) IV Stopped (02/10/21 1526)   vancomycin Stopped (02/10/21 1526)     LOS: 4 days   Time spent: 35mn  PDomenic Polite MD [facility]   02/11/2021, 2:09 PM

## 2021-02-11 NOTE — Progress Notes (Addendum)
Subjective: No complaints, said tolerate dialysis yesterday, he is off schedule due to high MWF census, will get back on before discharge to skilled nursing home  Objective Vital signs in last 24 hours: Vitals:   02/10/21 2341 02/11/21 0000 02/11/21 0330 02/11/21 0818  BP:  129/65 135/67 127/67  Pulse: 85 84 83 89  Resp: '18 19 16 19  '$ Temp:  98.9 F (37.2 C) 98.7 F (37.1 C) 97.6 F (36.4 C)  TempSrc:  Axillary Axillary Oral  SpO2: 94% 94% 95% 97%  Weight:      Height:       Weight change:   Physical Exam: General: Alert, obese chronically ill-appearing male in NAD Heart: Slightly irregular rate controlled no MRG Lungs: CTA anteriorly nonlabored breathing Abdomen: Obese bowel sounds normoactive lower abdominal edema also in  hips Extremities: Right lateral thigh wound dressing about to be changed by RN, bilateral lower extremity edema trace , bilateral feet with dry amp sites dialysis Access: I left J TDC  Dialysis Orders: Center: [facility] MWF 4 hrs F250 450/800 3.0K/2.5 Ca LIJ TDC  EDW 111 kg -Heparin 4900 units IV initial bolus, 500 units hourly, DC last hour of tx   Assessment/Plan:  R. Thigh Abscess: ABX started.  Plan per primary- maxipime and vanc.  Afebrile last 48 hours   ESRD - MWF however was switched to T,Th,S D/T high MWF census. K+ 3.6 on 8/4 planned use 4.0 K bath. Try tight heparin .    Hypertension/volume-Challenged 8/3 successfully removed 2.7 liters.  And on 8/4 had 2.8 L UF tolerated weight to 99.4 is below dry weight , mostly has edema persistent in  hips/abdomen.  Attempt the same tomorrow. Use Midodrine with extra 10 mg PO mid run. Concerned being in AFIB may impede progress.  Anemia  -HGB 8.0.>  9.0 started Aranesp 150 mcg IV with HD 02/08/21. Follow trends.  Metabolic bone disease -  C Ca high, VDRA is on hold. PO4 2.0. DC Calcium acetate, decrease auryxia to 1 tab PO BID for now.  Follow-up labs  Nutrition - Albumin 2.5  Renal/Carb mod diet with protein  supps, renal vits. DMT2-per primary Hypothyroidism-per primary H/O PAF: On amiodarone, apixaban and low-dose metoprolol and was scheduled for cardioversion but this has not happened yet. Patient requests that cardiology be made aware of admission.    10. Disposition: Does NOT wish to return to previous SNF. Wanting SNF closer to his home in [place]. Will consult MSW.   Ernest Haber, PA-C [facility] [phone] 02/11/2021,12:01 PM  LOS: 4 days   Labs: Basic Metabolic Panel: Recent Labs  Lab 02/08/21 1438 02/09/21 0044 02/10/21 0232  NA 138 137 136  K 3.2* 3.5 3.6  CL 103 100 100  CO2 24 21* 21*  GLUCOSE 88 92 124*  BUN 16 20 33*  CREATININE 3.40* 4.10* 5.32*  CALCIUM 8.6* 8.3* 8.7*  PHOS 2.0*  --   --    Liver Function Tests: Recent Labs  Lab 02/24/2021 1110 02/08/21 0447 02/08/21 1438  AST 49* 44*  --   ALT 6 7  --   ALKPHOS 89 89  --   BILITOT 1.0 1.3*  --   PROT 6.8 6.3*  --   ALBUMIN 2.3* 2.2* 2.5*   No results for input(s): LIPASE, AMYLASE in the last 168 hours. No results for input(s): AMMONIA in the last 168 hours. CBC: Recent Labs  Lab 02/24/2021 1110 03/06/2021 1151 02/08/21 0447 02/08/21 1438 02/09/21 0044 02/10/21 0232  02/11/21 0116  WBC 10.4  --  9.4 9.7 10.0 12.9* 13.2*  NEUTROABS 7.5  --   --   --   --   --   --   HGB 7.5*   < > 8.2* 8.1* 8.0* 8.2* 9.0*  HCT 25.4*   < > 28.0* 26.9* 25.7* 27.2* 29.3*  MCV 96.9  --  96.9 93.4 93.1 93.5 92.4  PLT 229  --  201 197 195 213 207   < > = values in this interval not displayed.   Cardiac Enzymes: No results for input(s): CKTOTAL, CKMB, CKMBINDEX, TROPONINI in the last 168 hours. CBG: Recent Labs  Lab 02/09/21 1948 02/10/21 0817 02/10/21 1707 02/10/21 2110 02/11/21 0816  GLUCAP 159* 126* 101* 143* 127*    Studies/Results: No results found. Medications:  ceFEPime (MAXIPIME) IV Stopped (02/10/21 1526)   vancomycin Stopped (02/10/21 1526)    (feeding supplement) PROSource  Plus  30 mL Oral BID BM   amiodarone  200 mg Oral BID   apixaban  5 mg Oral BID   atorvastatin  40 mg Oral QPM   chlorhexidine  15 mL Mouth Rinse BID   Chlorhexidine Gluconate Cloth  6 each Topical Q0600   cholestyramine light  4 g Oral BID   clopidogrel  75 mg Oral q AM   collagenase   Topical Daily   fentaNYL  1 patch Transdermal Q72H   ferric citrate  210 mg Oral BID WC   insulin aspart  0-5 Units Subcutaneous QHS   insulin aspart  0-9 Units Subcutaneous TID WC   insulin glargine-yfgn  8 Units Subcutaneous QHS   latanoprost  1 drop Both Eyes QHS   levothyroxine  25 mcg Oral QAC breakfast   mouth rinse  15 mL Mouth Rinse q12n4p   metoprolol succinate  12.5 mg Oral Daily   midodrine  10 mg Oral TID WC   multivitamin  1 tablet Oral QHS   pantoprazole  40 mg Oral Q0600   pregabalin  25 mg Oral BID   I have seen and examined this patient and agree with plan and assessment in the above note with renal recommendations/intervention highlighted. Continue with HD on TTS schedule for now and will need to get back to MWF schedule when closer to discharge.  Broadus John A Raheem Kolbe,MD 02/11/2021 1:02 PM

## 2021-02-12 DIAGNOSIS — E039 Hypothyroidism, unspecified: Secondary | ICD-10-CM | POA: Diagnosis not present

## 2021-02-12 DIAGNOSIS — T148XXA Other injury of unspecified body region, initial encounter: Secondary | ICD-10-CM | POA: Diagnosis not present

## 2021-02-12 DIAGNOSIS — E1151 Type 2 diabetes mellitus with diabetic peripheral angiopathy without gangrene: Secondary | ICD-10-CM | POA: Diagnosis not present

## 2021-02-12 DIAGNOSIS — N186 End stage renal disease: Secondary | ICD-10-CM | POA: Diagnosis not present

## 2021-02-12 LAB — BLOOD GAS, VENOUS
Acid-base deficit: 0.1 mmol/L (ref 0.0–2.0)
Bicarbonate: 24.8 mmol/L (ref 20.0–28.0)
Drawn by: 2585
O2 Saturation: 64.5 %
Patient temperature: 37
pCO2, Ven: 46.4 mmHg (ref 44.0–60.0)
pH, Ven: 7.348 (ref 7.250–7.430)
pO2, Ven: 38.5 mmHg (ref 32.0–45.0)

## 2021-02-12 LAB — CBC
HCT: 27.6 % — ABNORMAL LOW (ref 39.0–52.0)
Hemoglobin: 8.4 g/dL — ABNORMAL LOW (ref 13.0–17.0)
MCH: 28.3 pg (ref 26.0–34.0)
MCHC: 30.4 g/dL (ref 30.0–36.0)
MCV: 92.9 fL (ref 80.0–100.0)
Platelets: 219 10*3/uL (ref 150–400)
RBC: 2.97 MIL/uL — ABNORMAL LOW (ref 4.22–5.81)
RDW: 22.1 % — ABNORMAL HIGH (ref 11.5–15.5)
WBC: 11.8 10*3/uL — ABNORMAL HIGH (ref 4.0–10.5)
nRBC: 0 % (ref 0.0–0.2)

## 2021-02-12 LAB — CULTURE, BLOOD (ROUTINE X 2)
Culture: NO GROWTH
Special Requests: ADEQUATE

## 2021-02-12 LAB — BASIC METABOLIC PANEL
Anion gap: 10 (ref 5–15)
BUN: 30 mg/dL — ABNORMAL HIGH (ref 8–23)
CO2: 23 mmol/L (ref 22–32)
Calcium: 8.3 mg/dL — ABNORMAL LOW (ref 8.9–10.3)
Chloride: 97 mmol/L — ABNORMAL LOW (ref 98–111)
Creatinine, Ser: 4.54 mg/dL — ABNORMAL HIGH (ref 0.61–1.24)
GFR, Estimated: 14 mL/min — ABNORMAL LOW (ref >60)
Glucose, Bld: 144 mg/dL — ABNORMAL HIGH (ref 70–99)
Potassium: 4.1 mmol/L (ref 3.5–5.1)
Sodium: 130 mmol/L — ABNORMAL LOW (ref 135–145)

## 2021-02-12 LAB — GLUCOSE, CAPILLARY
Glucose-Capillary: 149 mg/dL — ABNORMAL HIGH (ref 70–99)
Glucose-Capillary: 137 mg/dL — ABNORMAL HIGH (ref 70–99)
Glucose-Capillary: 129 mg/dL — ABNORMAL HIGH (ref 70–99)
Glucose-Capillary: 250 mg/dL — ABNORMAL HIGH (ref 70–99)

## 2021-02-12 MED ORDER — HEPARIN SODIUM (PORCINE) 1000 UNIT/ML IJ SOLN
4200.0000 [IU] | Freq: Once | INTRAMUSCULAR | Status: AC
  Administered 2021-02-12: 4200 [IU] via INTRAVENOUS
  Filled 2021-02-12: qty 4.2

## 2021-02-12 NOTE — Progress Notes (Signed)
Pt returned from HD @ 1805 via bed, drowsy with slight confusion. Pt c/o constipation but has had 3 small to moderate soft stools since he returned to room. Pt did not remember staff or speaking to his wife prior to HD.

## 2021-02-12 NOTE — Progress Notes (Addendum)
PROGRESS NOTE    Kyle Walton  [id number] DOB: 03-Jan-1956 DOA: 03/06/2021 PCP: Patient, No Pcp Per (Inactive)  Brief Narrative: 65 year old male chronically ill with ESRD, anemia of chronic disease, type 2 diabetes mellitus, chronic diastolic CHF, paroxysmal A. fib on Eliquis, COPD/chronic hypoxic respiratory failure on 2 L home O2, nocturnal BiPAP, hypothyroidism, recently discharged from [facility] 7/[facility], treated for right thigh abscess, underwent I&D on 7/14,, completed 7 days of IV therapy inpatient and discharged to SNF on oral ciprofloxacin, which she allegedly may not have gotten.  Was seen by the wound nurse on 8/1, noted to have increased swelling tenderness and drainage from the wound, subsequently sent to the ED. -In the emergency room was briefly hypotensive, subsequently stabilized, started on broad-spectrum antibiotics   Assessment & Plan:   Right thigh abscess -Recent I&D on 7/14, cultures grew Serratia resistant to Ancef -Now admitted with clinical worsening, CT with some concern for developing compartment syndrome, reduced abscess volume, myositis -Appreciate orthopedics input, sutures removed to allow drainage,  -Reviewed with orthopedics yesterday, recommended to continue wound care and antibiotics, they will reassess on Monday, may perform bedside I&D  -CBC in a.m. -TOC following for discharge planning to new SNF  ESRD on hemodialysis -Nephrology consulting, dialysis TTS now due to high census in MWF schedule  Type 2 diabetes mellitus -CBG stable, continue current dose of Lantus  Chronic hypotension -Continue midodrine  Chronic diastolic CHF -Volume managed with HD  CAD/CABG -Continue beta-blocker, Plavix and statin  Paroxysmal atrial fibrillation -Continue Toprol, amiodarone, Eliquis  COPD/chronic respiratory failure -Resume BiPAP nightly  History of PAD -Prior toe amputation and metatarsal amputation -Continue Plavix, statin  DVT prophylaxis:  Apixaban Code Status: Full code Family Communication: Discussed patient in detail, no family at bedside Disposition Plan:  Status is: Inpatient  Remains inpatient appropriate because:Inpatient level of care appropriate due to severity of illness  Dispo: The patient is from: SNF              Anticipated d/c is to: SNF              Patient currently is not medically stable to d/c.   Difficult to place patient No   \ Consultants:  Orthopedics Nephrology  Procedures:   Antimicrobials:    Subjective: -Feels okay, less confused today, mild discomfort in his thigh, otherwise feels okay  Objective: Vitals:   02/11/21 1944 02/11/21 2348 02/12/21 0438 02/12/21 0810  BP: (!) 89/53 (!) 94/41 (!) 105/58 (!) 101/56  Pulse: 79 82 95 96  Resp: 17 14 (!) 22 18  Temp: 98.9 F (37.2 C) 98.9 F (37.2 C) 98.1 F (36.7 C) 97.6 F (36.4 C)  TempSrc: Oral Oral Oral Oral  SpO2: 96% 96% 97% 95%  Weight:   100 kg   Height:       No intake or output data in the 24 hours ending 02/12/21 1210   Filed Weights   02/10/21 1055 02/10/21 1545 02/12/21 0438  Weight: 102.7 kg 99.4 kg 100 kg    Examination:  General exam: Chronically ill elderly male, sitting up in bed, appears older than stated age, AAO x2, mild cognitive deficits HEENT: Left IJ HD catheter noted CVS: S1-S2, regular rate rhythm Lungs: Decreased breath sounds at the bases Abdomen: Soft, nontender, bowel sounds present  Extremities: Right thigh wound with slough surrounding erythema, tenderness, proximal induration, prior transmetatarsal amputation of right foot and amputation of 2 toes on left foot  Skin: As above Psychiatry: Poor insight  Data Reviewed:   CBC: Recent Labs  Lab 02/20/2021 1110 03/02/2021 1151 02/08/21 1438 02/09/21 0044 02/10/21 0232 02/11/21 0116 02/12/21 0124  WBC 10.4   < > 9.7 10.0 12.9* 13.2* 11.8*  NEUTROABS 7.5  --   --   --   --   --   --   HGB 7.5*   < > 8.1* 8.0* 8.2* 9.0* 8.4*  HCT  25.4*   < > 26.9* 25.7* 27.2* 29.3* 27.6*  MCV 96.9   < > 93.4 93.1 93.5 92.4 92.9  PLT 229   < > 197 195 213 207 219   < > = values in this interval not displayed.   Basic Metabolic Panel: Recent Labs  Lab 03/08/2021 1150 02/24/2021 1151 02/08/21 0447 02/08/21 1438 02/09/21 0044 02/10/21 0232 02/12/21 0124  NA  --    < > 140 138 137 136 130*  K  --    < > 3.7 3.2* 3.5 3.6 4.1  CL  --   --  103 103 100 100 97*  CO2  --   --  19* 24 21* 21* 23  GLUCOSE  --   --  98 88 92 124* 144*  BUN  --   --  34* 16 20 33* 30*  CREATININE  --   --  6.17* 3.40* 4.10* 5.32* 4.54*  CALCIUM  --   --  8.9 8.6* 8.3* 8.7* 8.3*  MG 1.9  --   --   --   --   --   --   PHOS  --   --   --  2.0*  --   --   --    < > = values in this interval not displayed.   GFR: Estimated Creatinine Clearance: 19.2 mL/min (A) (by C-G formula based on SCr of 4.54 mg/dL (H)). Liver Function Tests: Recent Labs  Lab 03/06/2021 1110 02/08/21 0447 02/08/21 1438  AST 49* 44*  --   ALT 6 7  --   ALKPHOS 89 89  --   BILITOT 1.0 1.3*  --   PROT 6.8 6.3*  --   ALBUMIN 2.3* 2.2* 2.5*   No results for input(s): LIPASE, AMYLASE in the last 168 hours. No results for input(s): AMMONIA in the last 168 hours. Coagulation Profile: Recent Labs  Lab 03/06/2021 1110  INR 1.9*   Cardiac Enzymes: No results for input(s): CKTOTAL, CKMB, CKMBINDEX, TROPONINI in the last 168 hours. BNP (last 3 results) No results for input(s): PROBNP in the last 8760 hours. HbA1C: No results for input(s): HGBA1C in the last 72 hours.  CBG: Recent Labs  Lab 02/11/21 1211 02/11/21 1653 02/11/21 2115 02/12/21 0809 02/12/21 1208  GLUCAP 169* 137* 130* 149* 137*   Lipid Profile: No results for input(s): CHOL, HDL, LDLCALC, TRIG, CHOLHDL, LDLDIRECT in the last 72 hours. Thyroid Function Tests: No results for input(s): TSH, T4TOTAL, FREET4, T3FREE, THYROIDAB in the last 72 hours. Anemia Panel: No results for input(s): VITAMINB12, FOLATE,  FERRITIN, TIBC, IRON, RETICCTPCT in the last 72 hours. Urine analysis: No results found for: COLORURINE, APPEARANCEUR, LABSPEC, PHURINE, GLUCOSEU, HGBUR, BILIRUBINUR, KETONESUR, PROTEINUR, UROBILINOGEN, NITRITE, LEUKOCYTESUR Sepsis Labs: '@LABRCNTIP'$ (procalcitonin:4,lacticidven:4)  ) Recent Results (from the past 240 hour(s))  Resp Panel by RT-PCR (Flu A&B, Covid) Nasopharyngeal Swab     Status: None   Collection Time: 02/16/2021  9:55 AM   Specimen: Nasopharyngeal Swab; Nasopharyngeal(NP) swabs in vial transport medium  Result Value Ref Range Status   SARS Coronavirus 2 by  RT PCR NEGATIVE NEGATIVE Final    Comment: (NOTE) SARS-CoV-2 target nucleic acids are NOT DETECTED.  The SARS-CoV-2 RNA is generally detectable in upper respiratory specimens during the acute phase of infection. The lowest concentration of SARS-CoV-2 viral copies this assay can detect is 138 copies/mL. A negative result does not preclude SARS-Cov-2 infection and should not be used as the sole basis for treatment or other patient management decisions. A negative result may occur with  improper specimen collection/handling, submission of specimen other than nasopharyngeal swab, presence of viral mutation(s) within the areas targeted by this assay, and inadequate number of viral copies(<138 copies/mL). A negative result must be combined with clinical observations, patient history, and epidemiological information. The expected result is Negative.  Fact Sheet for Patients:  EntrepreneurPulse.com.au  Fact Sheet for Healthcare Providers:  IncredibleEmployment.be  This test is no t yet approved or cleared by the [place] FDA and  has been authorized for detection and/or diagnosis of SARS-CoV-2 by FDA under an Emergency Use Authorization (EUA). This EUA will remain  in effect (meaning this test can be used) for the duration of the COVID-19 declaration under Section 564(b)(1) of the  Act, 21 U.S.C.section 360bbb-3(b)(1), unless the authorization is terminated  or revoked sooner.       Influenza A by PCR NEGATIVE NEGATIVE Final   Influenza B by PCR NEGATIVE NEGATIVE Final    Comment: (NOTE) The Xpert Xpress SARS-CoV-2/FLU/RSV plus assay is intended as an aid in the diagnosis of influenza from Nasopharyngeal swab specimens and should not be used as a sole basis for treatment. Nasal washings and aspirates are unacceptable for Xpert Xpress SARS-CoV-2/FLU/RSV testing.  Fact Sheet for Patients: EntrepreneurPulse.com.au  Fact Sheet for Healthcare Providers: IncredibleEmployment.be  This test is not yet approved or cleared by the [place] FDA and has been authorized for detection and/or diagnosis of SARS-CoV-2 by FDA under an Emergency Use Authorization (EUA). This EUA will remain in effect (meaning this test can be used) for the duration of the COVID-19 declaration under Section 564(b)(1) of the Act, 21 U.S.C. section 360bbb-3(b)(1), unless the authorization is terminated or revoked.  Performed at [facility], [place] [address] [place], [place] [address]   Blood Culture (routine x 2)     Status: None   Collection Time: 02/08/2021 11:10 AM   Specimen: BLOOD  Result Value Ref Range Status   Specimen Description BLOOD RIGHT ANTECUBITAL  Final   Special Requests   Final    BOTTLES DRAWN AEROBIC ONLY Blood Culture adequate volume   Culture   Final    NO GROWTH 5 DAYS Performed at [facility], [place] [address] [place], [place] [address]    Report Status 02/12/2021 FINAL  Final     Scheduled Meds:  (feeding supplement) PROSource Plus  30 mL Oral BID BM   amiodarone  200 mg Oral BID   apixaban  5 mg Oral BID   atorvastatin  40 mg Oral QPM   chlorhexidine  15 mL Mouth Rinse BID   Chlorhexidine Gluconate Cloth  6 each Topical Q0600   cholestyramine light  4 g Oral BID   clopidogrel  75 mg Oral q AM    collagenase   Topical Daily   fentaNYL  1 patch Transdermal Q72H   ferric citrate  210 mg Oral BID WC   insulin aspart  0-5 Units Subcutaneous QHS   insulin aspart  0-9 Units Subcutaneous TID WC   insulin glargine-yfgn  8 Units  Subcutaneous QHS   latanoprost  1 drop Both Eyes QHS   levothyroxine  25 mcg Oral QAC breakfast   mouth rinse  15 mL Mouth Rinse q12n4p   metoprolol succinate  12.5 mg Oral Daily   midodrine  10 mg Oral TID WC   multivitamin  1 tablet Oral QHS   pantoprazole  40 mg Oral Q0600   pregabalin  25 mg Oral BID   Continuous Infusions:  ceFEPime (MAXIPIME) IV Stopped (02/10/21 1526)   vancomycin Stopped (02/10/21 1526)     LOS: 5 days   Time spent: 11mn  PDomenic Polite MD [facility]   02/12/2021, 12:10 PM

## 2021-02-12 NOTE — Progress Notes (Signed)
Pt arrived to HD unit from room 5W18  Started on tx with soft pressures. Did not tolerate excessive UF removal. Received vanco and cefepime during tx Total removal tolerated 460 mL  Pressures stable to transfer back to sending unit

## 2021-02-13 DIAGNOSIS — N186 End stage renal disease: Secondary | ICD-10-CM | POA: Diagnosis not present

## 2021-02-13 DIAGNOSIS — E039 Hypothyroidism, unspecified: Secondary | ICD-10-CM | POA: Diagnosis not present

## 2021-02-13 DIAGNOSIS — T148XXA Other injury of unspecified body region, initial encounter: Secondary | ICD-10-CM | POA: Diagnosis not present

## 2021-02-13 DIAGNOSIS — E1151 Type 2 diabetes mellitus with diabetic peripheral angiopathy without gangrene: Secondary | ICD-10-CM | POA: Diagnosis not present

## 2021-02-13 LAB — GLUCOSE, CAPILLARY
Glucose-Capillary: 148 mg/dL — ABNORMAL HIGH (ref 70–99)
Glucose-Capillary: 138 mg/dL — ABNORMAL HIGH (ref 70–99)
Glucose-Capillary: 132 mg/dL — ABNORMAL HIGH (ref 70–99)
Glucose-Capillary: 146 mg/dL — ABNORMAL HIGH (ref 70–99)

## 2021-02-13 MED ORDER — ALUM & MAG HYDROXIDE-SIMETH 200-200-20 MG/5ML PO SUSP
15.0000 mL | Freq: Four times a day (QID) | ORAL | Status: DC | PRN
  Filled 2021-02-13: qty 30

## 2021-02-13 MED ORDER — ALUM & MAG HYDROXIDE-SIMETH 200-200-20 MG/5ML PO SUSP
30.0000 mL | ORAL | Status: DC | PRN
Start: 2021-02-13 — End: 2021-02-13
  Administered 2021-02-13: 30 mL via ORAL
  Filled 2021-02-13: qty 30

## 2021-02-13 MED ORDER — DARBEPOETIN ALFA 150 MCG/0.3ML IJ SOSY
150.0000 ug | PREFILLED_SYRINGE | INTRAMUSCULAR | Status: DC
  Filled 2021-02-13: qty 0.3

## 2021-02-13 NOTE — Progress Notes (Addendum)
Pharmacy Antibiotic Note - follow up  Kyle Walton is a 65 y.o. male admitted on 02/13/2021 with  R thigh wound infection s/p I&D 7/14 .  Pharmacy has been consulted for Vanco/Cefepime dosing.  Vanc 8/1>> Cefepime 8/1>>  Bcx 8/1: ngtd  7/14: R thigh: Serratia R to Ancef  Plan: Continue Vanc 1gm Q HD TTS Continue Cefepime 2g Q HD TTS F/u with narrowing, LOT, HD schedule and discharge plans Ordered Vanc random    Height: '5\' 10"'$  (177.8 cm) Weight: 102 kg (224 lb 13.9 oz) IBW/kg (Calculated) : 73  Temp (24hrs), Avg:98.3 F (36.8 C), Min:97.5 F (36.4 C), Max:99.1 F (37.3 C)  Recent Labs  Lab 03/09/2021 1110 02/22/2021 1428 02/08/21 0447 02/08/21 1438 02/09/21 0044 02/10/21 0232 02/11/21 0116 02/12/21 0124  WBC 10.4  --  9.4 9.7 10.0 12.9* 13.2* 11.8*  CREATININE 5.54*  --  6.17* 3.40* 4.10* 5.32*  --  4.54*  LATICACIDVEN 1.1 1.0  --   --   --   --   --   --      Estimated Creatinine Clearance: 19.4 mL/min (A) (by C-G formula based on SCr of 4.54 mg/dL (H)).    Allergies  Allergen Reactions   Morphine Other (See Comments)    Per son, Kyle Walton, pt becomes unresponsive/disoriented- especially when given after HD tx AND "ALLERGIC," per [facility]   Liraglutide Diarrhea and Other (See Comments)    "Phenol"...."ALLERGIC," per [facility]      Thank you for allowing pharmacy to be a part of this patient's care.  Levonne Spiller 02/13/2021 1:39 PM

## 2021-02-13 NOTE — Plan of Care (Signed)

## 2021-02-13 NOTE — Progress Notes (Signed)
PROGRESS NOTE    Kyle Walton  [id number] DOB: 1956/06/23 DOA: 02/16/2021 PCP: Patient, No Pcp Per (Inactive)  Brief Narrative: 65 year old male chronically ill with ESRD, anemia of chronic disease, type 2 diabetes mellitus, chronic diastolic CHF, paroxysmal A. fib on Eliquis, COPD/chronic hypoxic respiratory failure on 2 L home O2, nocturnal BiPAP, hypothyroidism, recently discharged from [facility] 7/22, treated for right thigh abscess, underwent I&D on 7/14,, completed 7 days of IV therapy inpatient and discharged to SNF on oral ciprofloxacin, which she allegedly may not have gotten.  Was seen by the wound nurse on 8/1, noted to have increased swelling tenderness and drainage from the wound, subsequently sent to the ED. -In the emergency room was briefly hypotensive, subsequently stabilized, started on broad-spectrum antibiotics   Assessment & Plan:   Right thigh abscess -Recent I&D on 7/14, cultures grew Serratia resistant to Ancef -admitted with clinical worsening, CT with some concern for developing compartment syndrome, reduced abscess volume, myositis -Appreciate orthopedics input, sutures removed to allow drainage -Continues to have moderate amount of slough in the wound base,  -Reviewed by orthopedics 8/5, recommended to continue wound care and antibiotics, they will reassess on Monday, may perform bedside I&D  -CBC in a.m. -TOC following for discharge planning to new SNF  ESRD on hemodialysis -Nephrology consulting, dialysis TTS now due to high census in MWF schedule  Type 2 diabetes mellitus -CBG stable, continue current dose of Lantus  Chronic hypotension -Continue midodrine  Chronic diastolic CHF -Volume managed with HD  CAD/CABG -Continue beta-blocker, Plavix and statin  Paroxysmal atrial fibrillation -Continue Toprol, amiodarone, Eliquis  COPD/chronic respiratory failure -Resume BiPAP nightly  History of PAD -Prior toe amputation and metatarsal  amputation -Continue Plavix, statin  DVT prophylaxis: Apixaban Code Status: Full code Family Communication: Discussed patient in detail, no family at bedside Disposition Plan:  Status is: Inpatient  Remains inpatient appropriate because:Inpatient level of care appropriate due to severity of illness  Dispo: The patient is from: SNF              Anticipated d/c is to: SNF              Patient currently is not medically stable to d/c.   Difficult to place patient No   \ Consultants:  Orthopedics Nephrology  Procedures:    Subjective: -Feels okay, less confused this morning, has mild discomfort in his thigh  Objective: Vitals:   02/13/21 0316 02/13/21 0424 02/13/21 0801 02/13/21 0802  BP: (!) 98/59 111/60  (!) 144/78  Pulse: 98 91 (!) 108 (!) 106  Resp: '16 14  19  '$ Temp: 98 F (36.7 C) 98.8 F (37.1 C)  98.2 F (36.8 C)  TempSrc: Oral Axillary  Oral  SpO2: 99% 94%  92%  Weight:  102 kg    Height:        Intake/Output Summary (Last 24 hours) at 02/13/2021 1222 Last data filed at 02/12/2021 1900 Gross per 24 hour  Intake 320 ml  Output 460 ml  Net -140 ml     Filed Weights   02/12/21 1300 02/12/21 1730 02/13/21 0424  Weight: 102 kg 101.5 kg 102 kg    Examination:  General exam: Chronically ill male, appears older than stated age, AAO x2, mild cognitive deficits HEENT: Left IJ HD catheter noted CVS: S1-S2, regular rate rhythm Lungs: Clear anteriorly Abdomen: Soft, nontender, bowel sounds present  Extremities: Right thigh wound with slough surrounding erythema, tenderness, proximal induration, prior transmetatarsal amputation of right foot and amputation  of 2 toes on left foot  Skin: As above Psychiatry: Poor insight   Data Reviewed:   CBC: Recent Labs  Lab 03/07/2021 1110 02/15/2021 1151 02/08/21 1438 02/09/21 0044 02/10/21 0232 02/11/21 0116 02/12/21 0124  WBC 10.4   < > 9.7 10.0 12.9* 13.2* 11.8*  NEUTROABS 7.5  --   --   --   --   --   --   HGB  7.5*   < > 8.1* 8.0* 8.2* 9.0* 8.4*  HCT 25.4*   < > 26.9* 25.7* 27.2* 29.3* 27.6*  MCV 96.9   < > 93.4 93.1 93.5 92.4 92.9  PLT 229   < > 197 195 213 207 219   < > = values in this interval not displayed.   Basic Metabolic Panel: Recent Labs  Lab 03/03/2021 1150 02/09/2021 1151 02/08/21 0447 02/08/21 1438 02/09/21 0044 02/10/21 0232 02/12/21 0124  NA  --    < > 140 138 137 136 130*  K  --    < > 3.7 3.2* 3.5 3.6 4.1  CL  --   --  103 103 100 100 97*  CO2  --   --  19* 24 21* 21* 23  GLUCOSE  --   --  98 88 92 124* 144*  BUN  --   --  34* 16 20 33* 30*  CREATININE  --   --  6.17* 3.40* 4.10* 5.32* 4.54*  CALCIUM  --   --  8.9 8.6* 8.3* 8.7* 8.3*  MG 1.9  --   --   --   --   --   --   PHOS  --   --   --  2.0*  --   --   --    < > = values in this interval not displayed.   GFR: Estimated Creatinine Clearance: 19.4 mL/min (A) (by C-G formula based on SCr of 4.54 mg/dL (H)). Liver Function Tests: Recent Labs  Lab 02/28/2021 1110 02/08/21 0447 02/08/21 1438  AST 49* 44*  --   ALT 6 7  --   ALKPHOS 89 89  --   BILITOT 1.0 1.3*  --   PROT 6.8 6.3*  --   ALBUMIN 2.3* 2.2* 2.5*   No results for input(s): LIPASE, AMYLASE in the last 168 hours. No results for input(s): AMMONIA in the last 168 hours. Coagulation Profile: Recent Labs  Lab 03/04/2021 1110  INR 1.9*   Cardiac Enzymes: No results for input(s): CKTOTAL, CKMB, CKMBINDEX, TROPONINI in the last 168 hours. BNP (last 3 results) No results for input(s): PROBNP in the last 8760 hours. HbA1C: No results for input(s): HGBA1C in the last 72 hours.  CBG: Recent Labs  Lab 02/12/21 1208 02/12/21 1809 02/12/21 2030 02/13/21 0801 02/13/21 1220  GLUCAP 137* 129* 250* 148* 138*   Lipid Profile: No results for input(s): CHOL, HDL, LDLCALC, TRIG, CHOLHDL, LDLDIRECT in the last 72 hours. Thyroid Function Tests: No results for input(s): TSH, T4TOTAL, FREET4, T3FREE, THYROIDAB in the last 72 hours. Anemia Panel: No results  for input(s): VITAMINB12, FOLATE, FERRITIN, TIBC, IRON, RETICCTPCT in the last 72 hours. Urine analysis: No results found for: COLORURINE, APPEARANCEUR, LABSPEC, PHURINE, GLUCOSEU, HGBUR, BILIRUBINUR, KETONESUR, PROTEINUR, UROBILINOGEN, NITRITE, LEUKOCYTESUR Sepsis Labs: '@LABRCNTIP'$ (procalcitonin:4,lacticidven:4)  ) Recent Results (from the past 240 hour(s))  Resp Panel by RT-PCR (Flu A&B, Covid) Nasopharyngeal Swab     Status: None   Collection Time: 03/06/2021  9:55 AM   Specimen: Nasopharyngeal Swab; Nasopharyngeal(NP) swabs in  vial transport medium  Result Value Ref Range Status   SARS Coronavirus 2 by RT PCR NEGATIVE NEGATIVE Final    Comment: (NOTE) SARS-CoV-2 target nucleic acids are NOT DETECTED.  The SARS-CoV-2 RNA is generally detectable in upper respiratory specimens during the acute phase of infection. The lowest concentration of SARS-CoV-2 viral copies this assay can detect is 138 copies/mL. A negative result does not preclude SARS-Cov-2 infection and should not be used as the sole basis for treatment or other patient management decisions. A negative result may occur with  improper specimen collection/handling, submission of specimen other than nasopharyngeal swab, presence of viral mutation(s) within the areas targeted by this assay, and inadequate number of viral copies(<138 copies/mL). A negative result must be combined with clinical observations, patient history, and epidemiological information. The expected result is Negative.  Fact Sheet for Patients:  EntrepreneurPulse.com.au  Fact Sheet for Healthcare Providers:  IncredibleEmployment.be  This test is no t yet approved or cleared by the [place] FDA and  has been authorized for detection and/or diagnosis of SARS-CoV-2 by FDA under an Emergency Use Authorization (EUA). This EUA will remain  in effect (meaning this test can be used) for the duration of the COVID-19  declaration under Section 564(b)(1) of the Act, 21 U.S.C.section 360bbb-3(b)(1), unless the authorization is terminated  or revoked sooner.       Influenza A by PCR NEGATIVE NEGATIVE Final   Influenza B by PCR NEGATIVE NEGATIVE Final    Comment: (NOTE) The Xpert Xpress SARS-CoV-2/FLU/RSV plus assay is intended as an aid in the diagnosis of influenza from Nasopharyngeal swab specimens and should not be used as a sole basis for treatment. Nasal washings and aspirates are unacceptable for Xpert Xpress SARS-CoV-2/FLU/RSV testing.  Fact Sheet for Patients: EntrepreneurPulse.com.au  Fact Sheet for Healthcare Providers: IncredibleEmployment.be  This test is not yet approved or cleared by the [place] FDA and has been authorized for detection and/or diagnosis of SARS-CoV-2 by FDA under an Emergency Use Authorization (EUA). This EUA will remain in effect (meaning this test can be used) for the duration of the COVID-19 declaration under Section 564(b)(1) of the Act, 21 U.S.C. section 360bbb-3(b)(1), unless the authorization is terminated or revoked.  Performed at [facility], [place] [address] [place], [place] [address]   Blood Culture (routine x 2)     Status: None   Collection Time: 02/11/2021 11:10 AM   Specimen: BLOOD  Result Value Ref Range Status   Specimen Description BLOOD RIGHT ANTECUBITAL  Final   Special Requests   Final    BOTTLES DRAWN AEROBIC ONLY Blood Culture adequate volume   Culture   Final    NO GROWTH 5 DAYS Performed at [facility], [place] [address] [place], [place] [address]    Report Status 02/12/2021 FINAL  Final     Scheduled Meds:  (feeding supplement) PROSource Plus  30 mL Oral BID BM   amiodarone  200 mg Oral BID   apixaban  5 mg Oral BID   atorvastatin  40 mg Oral QPM   chlorhexidine  15 mL Mouth Rinse BID   Chlorhexidine Gluconate Cloth  6 each Topical Q0600   cholestyramine light  4 g Oral BID    clopidogrel  75 mg Oral q AM   collagenase   Topical Daily   [START ON 02/15/2021] darbepoetin (ARANESP) injection - DIALYSIS  150 mcg Intravenous Q Tue-HD   fentaNYL  1 patch Transdermal Q72H   ferric citrate  210 mg Oral BID WC   insulin aspart  0-5 Units Subcutaneous QHS   insulin aspart  0-9 Units Subcutaneous TID WC   insulin glargine-yfgn  8 Units Subcutaneous QHS   latanoprost  1 drop Both Eyes QHS   levothyroxine  25 mcg Oral QAC breakfast   mouth rinse  15 mL Mouth Rinse q12n4p   metoprolol succinate  12.5 mg Oral Daily   midodrine  10 mg Oral TID WC   multivitamin  1 tablet Oral QHS   pantoprazole  40 mg Oral Q0600   pregabalin  25 mg Oral BID   Continuous Infusions:  ceFEPime (MAXIPIME) IV 2 g (02/12/21 1651)   vancomycin 1,000 mg (02/12/21 1645)     LOS: 6 days   Time spent: 50mn  PDomenic Polite MD [facility]   02/13/2021, 12:22 PM

## 2021-02-13 NOTE — Progress Notes (Signed)
Subjective: No current complaints had dialysis yesterday evening UF only 460 mL .  Overall feels thigh wound pain is significant improved  Objective Vital signs in last 24 hours: Vitals:   02/13/21 0316 02/13/21 0424 02/13/21 0801 02/13/21 0802  BP: (!) 98/59 111/60  (!) 144/78  Pulse: 98 91 (!) 108 (!) 106  Resp: '16 14  19  '$ Temp: 98 F (36.7 C) 98.8 F (37.1 C)  98.2 F (36.8 C)  TempSrc: Oral Axillary  Oral  SpO2: 99% 94%  92%  Weight:  102 kg    Height:       Weight change: 2 kg   Physical Exam: General: Alert, obese chronically ill-appearing male in NAD Heart: Slightly irregular rate controlled no MRG Lungs: CTA anteriorly nonlabored breathing Abdomen: Obese bowel sounds normoactive lower abdominal edema also in  hips Extremities: Right lateral thigh wound dressing dry and clean, trace bilat.LE edema  , bilateral feet with dry amp sites  Dialysis Access: [facility]   Dialysis Orders: [facility] MWF= changed to TTS in hospital 4 hrs F250 450/800 3.0K/2.5 Ca LIJ TDC  EDW 111 kg -Heparin 4900 units IV initial bolus, 500 units hourly, DC last hour of tx   Assessment/Plan:  R. Thigh Abscess: ABX started.  Plan per primary- maxipime and vanc.  Afebrile last 48 hours  ESRD - MWF however was switched to T,Th,S D/T high MWF census.K4.1 on 8/6 used 4.0 K bath. Try tight heparin .    Hypertension/volume-Challenged 8/3 successfully removed 2.7 liters.  And on 8/4 had 2.8 L UF tolerated weight to 99.4 804 ,yesterday wt 101.5 postdialysis , 486 ml UF is below dry weight ,has edema persistent in  hips/abdomen.  Attempt UF as tolerated next dialysis Tuesday . Use Midodrine with extra 10 mg PO mid run. Concerned being in AFIB may impede progress.  Anemia  -HGB 8.0.>  9.0 started Aranesp 150 mcg IV with HD 02/08/21. Follow trends.  Metabolic bone disease -  C Ca high, VDRA is on hold. PO4 2.0. DC Calcium acetate, decrease auryxia to 1 tab PO BID for now.  Follow-up labs  Nutrition -  Albumin 2.5  Renal/Carb mod diet with protein supps, renal vits. DMT2-per primary Hypothyroidism-per primary H/O PAF: On amiodarone, apixaban and low-dose metoprolol and was scheduled for cardioversion but this has not happened yet. Patient requests that cardiology be made aware of admission.    10. Disposition: Does NOT wish to return to previous SNF. Wanting SNF closer to his home in [place].   Ernest Haber, PA-C [facility] Beeper [phone] 02/13/2021,11:52 AM  LOS: 6 days   Labs: Basic Metabolic Panel: Recent Labs  Lab 02/08/21 1438 02/09/21 0044 02/10/21 0232 02/12/21 0124  NA 138 137 136 130*  K 3.2* 3.5 3.6 4.1  CL 103 100 100 97*  CO2 24 21* 21* 23  GLUCOSE 88 92 124* 144*  BUN 16 20 33* 30*  CREATININE 3.40* 4.10* 5.32* 4.54*  CALCIUM 8.6* 8.3* 8.7* 8.3*  PHOS 2.0*  --   --   --    Liver Function Tests: Recent Labs  Lab 02/10/2021 1110 02/08/21 0447 02/08/21 1438  AST 49* 44*  --   ALT 6 7  --   ALKPHOS 89 89  --   BILITOT 1.0 1.3*  --   PROT 6.8 6.3*  --   ALBUMIN 2.3* 2.2* 2.5*   No results for input(s): LIPASE, AMYLASE in the last 168 hours. No results for input(s):  AMMONIA in the last 168 hours. CBC: Recent Labs  Lab 02/28/2021 1110 03/08/2021 1151 02/08/21 1438 02/09/21 0044 02/10/21 0232 02/11/21 0116 02/12/21 0124  WBC 10.4   < > 9.7 10.0 12.9* 13.2* 11.8*  NEUTROABS 7.5  --   --   --   --   --   --   HGB 7.5*   < > 8.1* 8.0* 8.2* 9.0* 8.4*  HCT 25.4*   < > 26.9* 25.7* 27.2* 29.3* 27.6*  MCV 96.9   < > 93.4 93.1 93.5 92.4 92.9  PLT 229   < > 197 195 213 207 219   < > = values in this interval not displayed.   Cardiac Enzymes: No results for input(s): CKTOTAL, CKMB, CKMBINDEX, TROPONINI in the last 168 hours. CBG: Recent Labs  Lab 02/12/21 0809 02/12/21 1208 02/12/21 1809 02/12/21 2030 02/13/21 0801  GLUCAP 149* 137* 129* 250* 148*    Studies/Results: No results found. Medications:  ceFEPime (MAXIPIME) IV 2 g  (02/12/21 1651)   vancomycin 1,000 mg (02/12/21 1645)    (feeding supplement) PROSource Plus  30 mL Oral BID BM   amiodarone  200 mg Oral BID   apixaban  5 mg Oral BID   atorvastatin  40 mg Oral QPM   chlorhexidine  15 mL Mouth Rinse BID   Chlorhexidine Gluconate Cloth  6 each Topical Q0600   cholestyramine light  4 g Oral BID   clopidogrel  75 mg Oral q AM   collagenase   Topical Daily   fentaNYL  1 patch Transdermal Q72H   ferric citrate  210 mg Oral BID WC   insulin aspart  0-5 Units Subcutaneous QHS   insulin aspart  0-9 Units Subcutaneous TID WC   insulin glargine-yfgn  8 Units Subcutaneous QHS   latanoprost  1 drop Both Eyes QHS   levothyroxine  25 mcg Oral QAC breakfast   mouth rinse  15 mL Mouth Rinse q12n4p   metoprolol succinate  12.5 mg Oral Daily   midodrine  10 mg Oral TID WC   multivitamin  1 tablet Oral QHS   pantoprazole  40 mg Oral Q0600   pregabalin  25 mg Oral BID

## 2021-02-14 DIAGNOSIS — N186 End stage renal disease: Secondary | ICD-10-CM | POA: Diagnosis not present

## 2021-02-14 DIAGNOSIS — E1151 Type 2 diabetes mellitus with diabetic peripheral angiopathy without gangrene: Secondary | ICD-10-CM | POA: Diagnosis not present

## 2021-02-14 DIAGNOSIS — T148XXA Other injury of unspecified body region, initial encounter: Secondary | ICD-10-CM | POA: Diagnosis not present

## 2021-02-14 DIAGNOSIS — E039 Hypothyroidism, unspecified: Secondary | ICD-10-CM | POA: Diagnosis not present

## 2021-02-14 LAB — CBC
HCT: 28.2 % — ABNORMAL LOW (ref 39.0–52.0)
Hemoglobin: 8.3 g/dL — ABNORMAL LOW (ref 13.0–17.0)
MCH: 28 pg (ref 26.0–34.0)
MCHC: 29.4 g/dL — ABNORMAL LOW (ref 30.0–36.0)
MCV: 95.3 fL (ref 80.0–100.0)
Platelets: 198 10*3/uL (ref 150–400)
RBC: 2.96 MIL/uL — ABNORMAL LOW (ref 4.22–5.81)
RDW: 21.4 % — ABNORMAL HIGH (ref 11.5–15.5)
WBC: 10.5 10*3/uL (ref 4.0–10.5)
nRBC: 0 % (ref 0.0–0.2)

## 2021-02-14 LAB — VANCOMYCIN, RANDOM: Vancomycin Rm: 30

## 2021-02-14 LAB — GLUCOSE, CAPILLARY
Glucose-Capillary: 155 mg/dL — ABNORMAL HIGH (ref 70–99)
Glucose-Capillary: 115 mg/dL — ABNORMAL HIGH (ref 70–99)
Glucose-Capillary: 110 mg/dL — ABNORMAL HIGH (ref 70–99)
Glucose-Capillary: 136 mg/dL — ABNORMAL HIGH (ref 70–99)

## 2021-02-14 MED ORDER — INSULIN GLARGINE-YFGN 100 UNIT/ML ~~LOC~~ SOLN
5.0000 [IU] | Freq: Every day | SUBCUTANEOUS | Status: DC
  Administered 2021-02-14 – 2021-02-28 (×15): 5 [IU] via SUBCUTANEOUS
  Filled 2021-02-14 (×17): qty 0.05

## 2021-02-14 MED ORDER — CHLORHEXIDINE GLUCONATE 0.12 % MT SOLN
OROMUCOSAL | Status: AC
  Filled 2021-02-14: qty 15

## 2021-02-14 MED ORDER — VANCOMYCIN HCL IN DEXTROSE 750-5 MG/150ML-% IV SOLN
750.0000 mg | INTRAVENOUS | Status: DC
  Filled 2021-02-14: qty 150

## 2021-02-14 NOTE — Progress Notes (Addendum)
[facility] Progress Note   Subjective: Nauseated, vomiting. Call bell not within reach. However, he is excited that he has been able to get his weight down with HD.   Objective Vitals:   02/14/21 0000 02/14/21 0319 02/14/21 0510 02/14/21 0725  BP: (!) 108/58 (!) 81/48 104/64 (!) 98/58  Pulse: 98 100 99 100  Resp: '17 20 17 18  '$ Temp: 98.8 F (37.1 C) (!) 97.4 F (36.3 C) 97.7 F (36.5 C) 98.3 F (36.8 C)  TempSrc: Axillary Oral Oral Oral  SpO2: 98% 95% 99% 98%  Weight:  104.1 kg    Height:       Physical Exam General: Chronically ill appearing male in NAD Heart: S1,S2 irreg No M/R/G Lungs: CTAB Anteriorly Abdomen: obese with edema lower abd but improved from admission Extremities: still with some LE edema today Toe amps both feet. Has Wound R lateral drsg intact. Erythema RLE improved.  Dialysis Access: [facility]    Additional Objective Labs: Basic Metabolic Panel: Recent Labs  Lab 02/08/21 1438 02/09/21 0044 02/10/21 0232 02/12/21 0124  NA 138 137 136 130*  K 3.2* 3.5 3.6 4.1  CL 103 100 100 97*  CO2 24 21* 21* 23  GLUCOSE 88 92 124* 144*  BUN 16 20 33* 30*  CREATININE 3.40* 4.10* 5.32* 4.54*  CALCIUM 8.6* 8.3* 8.7* 8.3*  PHOS 2.0*  --   --   --    Liver Function Tests: Recent Labs  Lab 03/04/2021 1110 02/08/21 0447 02/08/21 1438  AST 49* 44*  --   ALT 6 7  --   ALKPHOS 89 89  --   BILITOT 1.0 1.3*  --   PROT 6.8 6.3*  --   ALBUMIN 2.3* 2.2* 2.5*   No results for input(s): LIPASE, AMYLASE in the last 168 hours. CBC: Recent Labs  Lab 02/26/2021 1110 03/08/2021 1151 02/09/21 0044 02/10/21 0232 02/11/21 0116 02/12/21 0124 02/14/21 0128  WBC 10.4   < > 10.0 12.9* 13.2* 11.8* 10.5  NEUTROABS 7.5  --   --   --   --   --   --   HGB 7.5*   < > 8.0* 8.2* 9.0* 8.4* 8.3*  HCT 25.4*   < > 25.7* 27.2* 29.3* 27.6* 28.2*  MCV 96.9   < > 93.1 93.5 92.4 92.9 95.3  PLT 229   < > 195 213 207 219 198   < > = values in this interval not displayed.    Blood Culture    Component Value Date/Time   SDES BLOOD RIGHT ANTECUBITAL 03/07/2021 1110   SPECREQUEST  02/12/2021 1110    BOTTLES DRAWN AEROBIC ONLY Blood Culture adequate volume   CULT  03/03/2021 1110    NO GROWTH 5 DAYS Performed at [facility], [place] [address], [place], [place] [address]    REPTSTATUS 02/12/2021 FINAL 03/07/2021 1110    Cardiac Enzymes: No results for input(s): CKTOTAL, CKMB, CKMBINDEX, TROPONINI in the last 168 hours. CBG: Recent Labs  Lab 02/13/21 0801 02/13/21 1220 02/13/21 1602 02/13/21 2013 02/14/21 0727  GLUCAP 148* 138* 132* 146* 155*   Iron Studies: No results for input(s): IRON, TIBC, TRANSFERRIN, FERRITIN in the last 72 hours. '@lablastinr3'$ @ Studies/Results: No results found. Medications:  ceFEPime (MAXIPIME) IV 2 g (02/12/21 1651)   vancomycin 1,000 mg (02/12/21 1645)    (feeding supplement) PROSource Plus  30 mL Oral BID BM   amiodarone  200 mg Oral BID   apixaban  5 mg Oral  BID   atorvastatin  40 mg Oral QPM   chlorhexidine  15 mL Mouth Rinse BID   Chlorhexidine Gluconate Cloth  6 each Topical Q0600   cholestyramine light  4 g Oral BID   clopidogrel  75 mg Oral q AM   collagenase   Topical Daily   [START ON 02/15/2021] darbepoetin (ARANESP) injection - DIALYSIS  150 mcg Intravenous Q Tue-HD   fentaNYL  1 patch Transdermal Q72H   ferric citrate  210 mg Oral BID WC   insulin aspart  0-5 Units Subcutaneous QHS   insulin aspart  0-9 Units Subcutaneous TID WC   insulin glargine-yfgn  8 Units Subcutaneous QHS   latanoprost  1 drop Both Eyes QHS   levothyroxine  25 mcg Oral QAC breakfast   mouth rinse  15 mL Mouth Rinse q12n4p   metoprolol succinate  12.5 mg Oral Daily   midodrine  10 mg Oral TID WC   multivitamin  1 tablet Oral QHS   pantoprazole  40 mg Oral Q0600   pregabalin  25 mg Oral BID     Dialysis Orders: Center: [facility] MWF= changed to TTS in hospital 4 hrs F250 450/800 3.0K/2.5 Ca LIJ TDC  EDW 111 kg -Heparin  4900 units IV initial bolus, 500 units hourly, DC last hour of tx   Assessment/Plan:  R. Thigh Abscess: Continues cepepime and vanc.  Afebrile last 48 hours Per primary.  ESRD - MWF however was switched to TTS here d/t high MWF census. Will change back to MWF as he approaches DC. Next HD 02/15/2021  Hypertension/volume-Have attempted to challenge and lower EDW since admit. Nadir wt 101.5 kg 02/12/21. Using Midodrine with extra 10 mg PO mid run to offset intradialyic hypotension. Continue lowering volume as tolerated. Concerned being in AFIB may impede progress.  Anemia  - HGB 8.3.  started Aranesp 150 mcg IV with HD 02/08/21. Follow trends.  Metabolic bone disease -  C Ca high, VDRA is on hold. PO4 2.0. DC Calcium acetate, decrease auryxia to 1 tab PO BID for now.  No recent RFP. Order with HD tomorrow.   Nutrition - Albumin 2.5  Renal/Carb mod diet with protein supps, renal vits. DMT2-per primary Hypothyroidism-per primary H/O PAF: On amiodarone, apixaban and low-dose metoprolol and was scheduled for cardioversion but this has not happened yet. Patient requests that cardiology be made aware of admission.    10. Disposition: Does NOT wish to return to previous SNF. Wanting SNF closer to his home in [place].     Rita H. Brown NP-C 02/14/2021, 10:45 AM  [facility] [phone]  Pt seen, examined and agree w A/P as above.  Kelly Splinter  MD 02/14/2021, 3:50 PM

## 2021-02-14 NOTE — Progress Notes (Signed)
Pharmacy Antibiotic Note - follow up  Kyle Walton is a 65 y.o. male admitted on 03/02/2021 with  R thigh wound infection s/p I&D 7/14 .  Pharmacy has been consulted for Vanco/Cefepime dosing.  Vanc 8/1>> Cefepime 8/1>>  PReHD vanc random level = 30 mcg/ml Goal 15-25 mcg/ml.  Patient appears to have tolerated 4 hours at 400 BFR. Will decrease dose with each vancomycin   Bcx 8/1: ngtd  7/14: R thigh: Serratia R to Ancef  Plan: Decrease Vanc '750mg'$  Q HD TTS Continue Cefepime 2g Q HD TTS F/u with narrowing, LOT, HD schedule and discharge plans    Height: '5\' 10"'$  (177.8 cm) Weight: 104.1 kg (229 lb 8 oz) IBW/kg (Calculated) : 73  Temp (24hrs), Avg:98.2 F (36.8 C), Min:97.4 F (36.3 C), Max:99.3 F (37.4 C)  Recent Labs  Lab 02/22/2021 1428 02/08/21 0447 02/08/21 0447 02/08/21 1438 02/09/21 0044 02/10/21 0232 02/11/21 0116 02/12/21 0124 02/14/21 0128  WBC  --  9.4   < > 9.7 10.0 12.9* 13.2* 11.8* 10.5  CREATININE  --  6.17*  --  3.40* 4.10* 5.32*  --  4.54*  --   LATICACIDVEN 1.0  --   --   --   --   --   --   --   --   VANCORANDOM  --   --   --   --   --   --   --   --  30   < > = values in this interval not displayed.     Estimated Creatinine Clearance: 19.6 mL/min (A) (by C-G formula based on SCr of 4.54 mg/dL (H)).    Allergies  Allergen Reactions   Morphine Other (See Comments)    Per son, Kyle Walton, pt becomes unresponsive/disoriented- especially when given after HD tx AND "ALLERGIC," per MAR   Liraglutide Diarrhea and Other (See Comments)    "Phenol"...."ALLERGIC," per MAR      Kyle Walton A. Levada Dy, PharmD, BCPS, FNKF Clinical Pharmacist [place] Please utilize Amion for appropriate phone number to reach the unit pharmacist ([place])  02/14/2021 12:40 PM

## 2021-02-14 NOTE — Plan of Care (Signed)

## 2021-02-14 NOTE — Progress Notes (Signed)
Patient ID: Kyle Walton, male   DOB: 1956/06/20, 65 y.o.   MRN: [id number]   LOS: 7 days   Subjective: Leg wound continues to improve though still causing some pain and draining brown fluid.   Objective: Vital signs in last 24 hours: Temp:  [97.4 F (36.3 C)-99.3 F (37.4 C)] 98.3 F (36.8 C) (08/08 0725) Pulse Rate:  [92-103] 100 (08/08 0725) Resp:  [17-22] 18 (08/08 0725) BP: (81-111)/(48-70) 98/58 (08/08 0725) SpO2:  [94 %-99 %] 98 % (08/08 0725) Weight:  [104.1 kg] 104.1 kg (08/08 0319) Last BM Date: 02/13/21   Laboratory  CBC Recent Labs    02/12/21 0124 02/14/21 0128  WBC 11.8* 10.5  HGB 8.4* 8.3*  HCT 27.6* 28.2*  PLT 219 198   BMET Recent Labs    02/12/21 0124  NA 130*  K 4.1  CL 97*  CO2 23  GLUCOSE 144*  BUN 30*  CREATININE 4.54*  CALCIUM 8.3*     Physical Exam General appearance: alert and no distress Right thigh: Wound stable in appearance with gray necrotic tissue, some mild purulence expressible from wound. Partially debrided bedside.   Assessment/Plan: Right thigh wound -- Will take back for I&D by Dr. Stann Mainland. Likely Wednesday but may be able to go tomorrow so will make NPO tonight. May need plastics input depending on scope of debridement.    Lisette Abu, PA-C Orthopedic Surgery [phone] 02/14/2021

## 2021-02-14 NOTE — Progress Notes (Signed)
PROGRESS NOTE    Kyle Walton  [id number] DOB: June 25, 1956 DOA: 02/21/2021 PCP: Patient, No Pcp Per (Inactive)  Brief Narrative: 65 year old male chronically ill with ESRD, anemia of chronic disease, type 2 diabetes mellitus, chronic diastolic CHF, paroxysmal A. fib on Eliquis, COPD/chronic hypoxic respiratory failure on 2 L home O2, nocturnal BiPAP, hypothyroidism, recently discharged from [facility] 7/22, treated for right thigh abscess, underwent I&D on 7/14,, completed 7 days of IV therapy inpatient and discharged to SNF on oral ciprofloxacin, which she allegedly may not have gotten.  Was seen by the wound nurse on 8/1, noted to have increased swelling tenderness and drainage from the wound, subsequently sent to the ED. -In the emergency room was briefly hypotensive, subsequently stabilized, started on broad-spectrum antibiotics   Assessment & Plan:   Right thigh abscess -Recent I&D on 7/14, cultures grew Serratia resistant to Ancef -admitted with clinical worsening, CT with some concern for developing compartment syndrome, reduced abscess volume, myositis -Appreciate orthopedics input, sutures removed to allow drainage -Continues to have moderate amount of slough in the wound base,  -Reviewed by orthopedics 8/5, and today, plan for repeat I&D on Wednesday or tomorrow, will hold Eliquis and Plavix -TOC following for discharge planning to new SNF  ESRD on hemodialysis -Nephrology consulting, dialysis TTS now due to high census in MWF schedule  Type 2 diabetes mellitus -CBG stable, continue current dose of Lantus  Chronic hypotension -Continue midodrine  Chronic diastolic CHF -Volume managed with HD  CAD/CABG -Continue beta-blocker, statin -Hold Plavix for repeat I&D  Paroxysmal atrial fibrillation -Continue Toprol, amiodarone,  -Hold Eliquis   COPD/chronic respiratory failure -Resume BiPAP nightly  History of PAD -Prior toe amputation and metatarsal amputation -Continue  statin, Plavix on hold  DVT prophylaxis: Hold apixaban, resume after I&D Code Status: Full code Family Communication: Discussed patient in detail, no family at bedside Disposition Plan:  Status is: Inpatient  Remains inpatient appropriate because:Inpatient level of care appropriate due to severity of illness  Dispo: The patient is from: SNF              Anticipated d/c is to: SNF              Patient currently is not medically stable to d/c.   Difficult to place patient No   \ Consultants:  Orthopedics Nephrology  Procedures:    Subjective: -Has some discomfort in his thigh, overall feels okay, intermittent confusion  Objective: Vitals:   02/14/21 0319 02/14/21 0510 02/14/21 0725 02/14/21 1240  BP: (!) 81/48 104/64 (!) 98/58 (!) 97/42  Pulse: 100 99 100 95  Resp: '20 17 18 20  '$ Temp: (!) 97.4 F (36.3 C) 97.7 F (36.5 C) 98.3 F (36.8 C) 98.5 F (36.9 C)  TempSrc: Oral Oral Oral Oral  SpO2: 95% 99% 98% 97%  Weight: 104.1 kg     Height:        Intake/Output Summary (Last 24 hours) at 02/14/2021 1411 Last data filed at 02/14/2021 0944 Gross per 24 hour  Intake 340 ml  Output 203 ml  Net 137 ml     Filed Weights   02/12/21 1730 02/13/21 0424 02/14/21 0319  Weight: 101.5 kg 102 kg 104.1 kg    Examination:  General exam: Chronically ill male appears older than stated age, AAO x2, mild cognitive deficits HEENT: Left IJ HD catheter CVS: S1-S2, regular rate rhythm Lungs: Clear bilaterally  Abdomen: Soft, nontender, bowel sounds present Extremities: Right thigh wound with slough, surrounding erythema tenderness, proximal induration,  prior transmetatarsal amputation of right foot and amputation of 2 toes on left foot  Skin: As above Psychiatry: Poor insight   Data Reviewed:   CBC: Recent Labs  Lab 02/09/21 0044 02/10/21 0232 02/11/21 0116 02/12/21 0124 02/14/21 0128  WBC 10.0 12.9* 13.2* 11.8* 10.5  HGB 8.0* 8.2* 9.0* 8.4* 8.3*  HCT 25.7* 27.2* 29.3*  27.6* 28.2*  MCV 93.1 93.5 92.4 92.9 95.3  PLT 195 213 207 219 [id number]   Basic Metabolic Panel: Recent Labs  Lab 02/08/21 0447 02/08/21 1438 02/09/21 0044 02/10/21 0232 02/12/21 0124  NA 140 138 137 136 130*  K 3.7 3.2* 3.5 3.6 4.1  CL 103 103 100 100 97*  CO2 19* 24 21* 21* 23  GLUCOSE 98 88 92 124* 144*  BUN 34* 16 20 33* 30*  CREATININE 6.17* 3.40* 4.10* 5.32* 4.54*  CALCIUM 8.9 8.6* 8.3* 8.7* 8.3*  PHOS  --  2.0*  --   --   --    GFR: Estimated Creatinine Clearance: 19.6 mL/min (A) (by C-G formula based on SCr of 4.54 mg/dL (H)). Liver Function Tests: Recent Labs  Lab 02/08/21 0447 02/08/21 1438  AST 44*  --   ALT 7  --   ALKPHOS 89  --   BILITOT 1.3*  --   PROT 6.3*  --   ALBUMIN 2.2* 2.5*   No results for input(s): LIPASE, AMYLASE in the last 168 hours. No results for input(s): AMMONIA in the last 168 hours. Coagulation Profile: No results for input(s): INR, PROTIME in the last 168 hours.  Cardiac Enzymes: No results for input(s): CKTOTAL, CKMB, CKMBINDEX, TROPONINI in the last 168 hours. BNP (last 3 results) No results for input(s): PROBNP in the last 8760 hours. HbA1C: No results for input(s): HGBA1C in the last 72 hours.  CBG: Recent Labs  Lab 02/13/21 1220 02/13/21 1602 02/13/21 2013 02/14/21 0727 02/14/21 1243  GLUCAP 138* 132* 146* 155* 115*   Lipid Profile: No results for input(s): CHOL, HDL, LDLCALC, TRIG, CHOLHDL, LDLDIRECT in the last 72 hours. Thyroid Function Tests: No results for input(s): TSH, T4TOTAL, FREET4, T3FREE, THYROIDAB in the last 72 hours. Anemia Panel: No results for input(s): VITAMINB12, FOLATE, FERRITIN, TIBC, IRON, RETICCTPCT in the last 72 hours. Urine analysis: No results found for: COLORURINE, APPEARANCEUR, LABSPEC, PHURINE, GLUCOSEU, HGBUR, BILIRUBINUR, KETONESUR, PROTEINUR, UROBILINOGEN, NITRITE, LEUKOCYTESUR Sepsis Labs: '@LABRCNTIP'$ (procalcitonin:4,lacticidven:4)  ) Recent Results (from the past 240 hour(s))   Resp Panel by RT-PCR (Flu A&B, Covid) Nasopharyngeal Swab     Status: None   Collection Time: 03/04/2021  9:55 AM   Specimen: Nasopharyngeal Swab; Nasopharyngeal(NP) swabs in vial transport medium  Result Value Ref Range Status   SARS Coronavirus 2 by RT PCR NEGATIVE NEGATIVE Final    Comment: (NOTE) SARS-CoV-2 target nucleic acids are NOT DETECTED.  The SARS-CoV-2 RNA is generally detectable in upper respiratory specimens during the acute phase of infection. The lowest concentration of SARS-CoV-2 viral copies this assay can detect is 138 copies/mL. A negative result does not preclude SARS-Cov-2 infection and should not be used as the sole basis for treatment or other patient management decisions. A negative result may occur with  improper specimen collection/handling, submission of specimen other than nasopharyngeal swab, presence of viral mutation(s) within the areas targeted by this assay, and inadequate number of viral copies(<138 copies/mL). A negative result must be combined with clinical observations, patient history, and epidemiological information. The expected result is Negative.  Fact Sheet for Patients:  EntrepreneurPulse.com.au  Fact Sheet for  Healthcare Providers:  IncredibleEmployment.be  This test is no t yet approved or cleared by the [place] and  has been authorized for detection and/or diagnosis of SARS-CoV-2 by FDA under an Emergency Use Authorization (EUA). This EUA will remain  in effect (meaning this test can be used) for the duration of the COVID-19 declaration under Section 564(b)(1) of the Act, 21 U.S.C.section 360bbb-3(b)(1), unless the authorization is terminated  or revoked sooner.       Influenza A by PCR NEGATIVE NEGATIVE Final   Influenza B by PCR NEGATIVE NEGATIVE Final    Comment: (NOTE) The Xpert Xpress SARS-CoV-2/FLU/RSV plus assay is intended as an aid in the diagnosis of influenza from  Nasopharyngeal swab specimens and should not be used as a sole basis for treatment. Nasal washings and aspirates are unacceptable for Xpert Xpress SARS-CoV-2/FLU/RSV testing.  Fact Sheet for Patients: EntrepreneurPulse.com.au  Fact Sheet for Healthcare Providers: IncredibleEmployment.be  This test is not yet approved or cleared by the [place] FDA and has been authorized for detection and/or diagnosis of SARS-CoV-2 by FDA under an Emergency Use Authorization (EUA). This EUA will remain in effect (meaning this test can be used) for the duration of the COVID-19 declaration under Section 564(b)(1) of the Act, 21 U.S.C. section 360bbb-3(b)(1), unless the authorization is terminated or revoked.  Performed at [facility], [place] [address], [place], [place] [address]   Blood Culture (routine x 2)     Status: None   Collection Time: 02/16/2021 11:10 AM   Specimen: BLOOD  Result Value Ref Range Status   Specimen Description BLOOD RIGHT ANTECUBITAL  Final   Special Requests   Final    BOTTLES DRAWN AEROBIC ONLY Blood Culture adequate volume   Culture   Final    NO GROWTH 5 DAYS Performed at [facility], [place] [address] [place],  [address]    Report Status 02/12/2021 FINAL  Final     Scheduled Meds:  (feeding supplement) PROSource Plus  30 mL Oral BID BM   amiodarone  200 mg Oral BID   apixaban  5 mg Oral BID   atorvastatin  40 mg Oral QPM   chlorhexidine  15 mL Mouth Rinse BID   Chlorhexidine Gluconate Cloth  6 each Topical Q0600   cholestyramine light  4 g Oral BID   clopidogrel  75 mg Oral q AM   collagenase   Topical Daily   [START ON 02/15/2021] darbepoetin (ARANESP) injection - DIALYSIS  150 mcg Intravenous Q Tue-HD   fentaNYL  1 patch Transdermal Q72H   ferric citrate  210 mg Oral BID WC   insulin aspart  0-5 Units Subcutaneous QHS   insulin aspart  0-9 Units Subcutaneous TID WC   insulin glargine-yfgn  8 Units  Subcutaneous QHS   latanoprost  1 drop Both Eyes QHS   levothyroxine  25 mcg Oral QAC breakfast   mouth rinse  15 mL Mouth Rinse q12n4p   metoprolol succinate  12.5 mg Oral Daily   midodrine  10 mg Oral TID WC   multivitamin  1 tablet Oral QHS   pantoprazole  40 mg Oral Q0600   pregabalin  25 mg Oral BID   Continuous Infusions:  ceFEPime (MAXIPIME) IV 2 g (02/12/21 1651)   [START ON 02/15/2021] vancomycin       LOS: 7 days   Time spent: 23mn  PDomenic Polite MD [facility]   02/14/2021, 2:11 PM

## 2021-02-14 NOTE — Progress Notes (Signed)
PT states he does not want to use bipap/dream station tonight. Instructed PT to call if he changed his mind.

## 2021-02-14 NOTE — TOC Progression Note (Signed)
Transition of Care [facility]) - Progression Note    Patient Details  Name: Kyle Walton MRN: [id number] Date of Birth: March 09, 1956  Transition of Care [facility]) CM/SW [place], [place] Phone Number: 02/14/2021, 9:07 AM  Clinical Narrative:    Patient does not have any additional SNF bed offers at this time.    Expected Discharge Plan: Skilled Nursing Facility Barriers to Discharge: Ship broker, Continued Medical Work up  Expected Discharge Plan and Services Expected Discharge Plan: [place] In-house Referral: Clinical Social Work   Post Acute Care Choice: [place] Living arrangements for the past 2 months: [place] Determinants of Health (SDOH) Interventions    Readmission Risk Interventions Readmission Risk Prevention Plan 01/28/2021  Transportation Screening Complete  Medication Review Press photographer) Referral to Pharmacy  PCP or Specialist appointment within 3-5 days of discharge Complete  HRI or Home Care Consult Complete  SW Recovery Care/Counseling Consult Complete  Palliative Care Screening Not [place] Complete

## 2021-02-15 DIAGNOSIS — T148XXA Other injury of unspecified body region, initial encounter: Secondary | ICD-10-CM | POA: Diagnosis not present

## 2021-02-15 DIAGNOSIS — E039 Hypothyroidism, unspecified: Secondary | ICD-10-CM | POA: Diagnosis not present

## 2021-02-15 DIAGNOSIS — E1151 Type 2 diabetes mellitus with diabetic peripheral angiopathy without gangrene: Secondary | ICD-10-CM | POA: Diagnosis not present

## 2021-02-15 DIAGNOSIS — N186 End stage renal disease: Secondary | ICD-10-CM | POA: Diagnosis not present

## 2021-02-15 LAB — GLUCOSE, CAPILLARY
Glucose-Capillary: 105 mg/dL — ABNORMAL HIGH (ref 70–99)
Glucose-Capillary: 82 mg/dL (ref 70–99)
Glucose-Capillary: 93 mg/dL (ref 70–99)
Glucose-Capillary: 98 mg/dL (ref 70–99)

## 2021-02-15 LAB — BASIC METABOLIC PANEL WITH GFR
Anion gap: 11 (ref 5–15)
BUN: 42 mg/dL — ABNORMAL HIGH (ref 8–23)
CO2: 23 mmol/L (ref 22–32)
Calcium: 8.3 mg/dL — ABNORMAL LOW (ref 8.9–10.3)
Chloride: 98 mmol/L (ref 98–111)
Creatinine, Ser: 5.38 mg/dL — ABNORMAL HIGH (ref 0.61–1.24)
GFR, Estimated: 11 mL/min — ABNORMAL LOW
Glucose, Bld: 94 mg/dL (ref 70–99)
Potassium: 4.5 mmol/L (ref 3.5–5.1)
Sodium: 132 mmol/L — ABNORMAL LOW (ref 135–145)

## 2021-02-15 LAB — CBC
HCT: 27.1 % — ABNORMAL LOW (ref 39.0–52.0)
Hemoglobin: 8 g/dL — ABNORMAL LOW (ref 13.0–17.0)
MCH: 28 pg (ref 26.0–34.0)
MCHC: 29.5 g/dL — ABNORMAL LOW (ref 30.0–36.0)
MCV: 94.8 fL (ref 80.0–100.0)
Platelets: 214 10*3/uL (ref 150–400)
RBC: 2.86 MIL/uL — ABNORMAL LOW (ref 4.22–5.81)
RDW: 20.9 % — ABNORMAL HIGH (ref 11.5–15.5)
WBC: 11.9 10*3/uL — ABNORMAL HIGH (ref 4.0–10.5)
nRBC: 0 % (ref 0.0–0.2)

## 2021-02-15 MED ORDER — MIDODRINE HCL 5 MG PO TABS
ORAL_TABLET | ORAL | Status: AC
  Administered 2021-02-15: 10 mg via ORAL
  Filled 2021-02-15: qty 2

## 2021-02-15 MED ORDER — HEPARIN SODIUM (PORCINE) 1000 UNIT/ML IJ SOLN
INTRAMUSCULAR | Status: AC
  Filled 2021-02-15: qty 1

## 2021-02-15 MED ORDER — DARBEPOETIN ALFA 150 MCG/0.3ML IJ SOSY
PREFILLED_SYRINGE | INTRAMUSCULAR | Status: AC
  Administered 2021-02-15: 150 ug via INTRAVENOUS
  Filled 2021-02-15: qty 0.3

## 2021-02-15 MED ORDER — MIDODRINE HCL 5 MG PO TABS
ORAL_TABLET | ORAL | Status: AC
  Filled 2021-02-15: qty 2

## 2021-02-15 MED ORDER — VANCOMYCIN HCL 750 MG/150ML IV SOLN
INTRAVENOUS | Status: AC
  Administered 2021-02-15: 750 mg
  Filled 2021-02-15: qty 150

## 2021-02-15 NOTE — Progress Notes (Signed)
PT Cancellation Note  Patient Details Name: Kyle Walton MRN: [id number] DOB: [birth year]   Cancelled Treatment:    Reason Eval/Treat Not Completed: Patient at procedure or test/unavailable just left for HD- will attempt to see later today if time/schedule allow.   Windell Norfolk, DPT, PN2   Supplemental Physical Therapist [place]    Pager [phone] Acute Rehab Office [phone]

## 2021-02-15 NOTE — Progress Notes (Addendum)
PROGRESS NOTE    Kyle Walton  [id number] DOB: 1955/12/25 DOA: 03/03/2021 PCP: Patient, No Pcp Per (Inactive)  Brief Narrative: 65/M chronically ill with ESRD, anemia of chronic disease, type 2 diabetes mellitus, chronic diastolic CHF, paroxysmal A. fib on Eliquis, COPD/chronic hypoxic respiratory failure on 2 L home O2, nocturnal BiPAP, hypothyroidism, recently discharged from [facility] 7/22, treated for right thigh abscess, underwent I&D on 7/14,, completed 7 days of IV therapy inpatient and discharged to SNF on oral ciprofloxacin, which he allegedly may not have gotten.  Was seen by the wound nurse on 8/1, noted to have increased swelling tenderness and drainage from the wound, subsequently sent to the ED. -In the emergency room was briefly hypotensive, subsequently stabilized, started on broad-spectrum antibiotics, Ortho consulted, initially recommended antibiotics -Treated with broad-spectrum antibiotics, plan for repeat debridement   Assessment & Plan:   Right thigh abscess -Recent I&D on 7/14, cultures grew Serratia resistant to Ancef -admitted with clinical worsening, CT with some concern for developing compartment syndrome, reduced abscess volume, myositis -Appreciate orthopedics input, sutures removed to allow drainage -Continues to have moderate amount of slough, discolored wound base -Reviewed by orthopedics 8/5, and 8/8, plan for repeat I&D on Wednesday will hold Eliquis and Plavix -TOC following, for discharge to new SNF when stable  ESRD on hemodialysis -Nephrology consulting, dialysis TTS now due to high census in MWF schedule  Type 2 diabetes mellitus -CBG stable, Lantus dose lowered for n.p.o. orders  Chronic hypotension -Continue midodrine  Chronic diastolic CHF -Volume managed with HD  CAD/CABG -Continue beta-blocker, statin -Hold Plavix for repeat I&D  Paroxysmal atrial fibrillation -Continue Toprol, amiodarone,  -Hold Eliquis   COPD/chronic respiratory  failure -continue BiPAP nightly  History of PAD -Prior toe amputation and metatarsal amputation -Continue statin, Plavix on hold  Anemia of chronic disease -Continue EPO with HD  DVT prophylaxis: Hold apixaban, resume after I&D Code Status: Full code Family Communication: Discussed patient in detail, no family at bedside, left voicemail for wife Lyndle Virgen Disposition Plan:  Status is: Inpatient  Remains inpatient appropriate because:Inpatient level of care appropriate due to severity of illness  Dispo: The patient is from: SNF              Anticipated d/c is to: SNF              Patient currently is not medically stable to d/c.   Difficult to place patient No   \ Consultants:  Orthopedics Nephrology  Procedures:    Subjective: -some confusion overnight, feels ok today, going for HD  Objective: Vitals:   02/15/21 1230 02/15/21 1300 02/15/21 1330 02/15/21 1350  BP: (!) 98/45 (!) 111/43 102/62   Pulse: 87  86   Resp:  20 16   Temp:  98.4 F (36.9 C) 98.5 F (36.9 C)   TempSrc:  Oral Oral   SpO2:  95% 97%   Weight:    102.1 kg  Height:        Intake/Output Summary (Last 24 hours) at 02/15/2021 1419 Last data filed at 02/15/2021 1252 Gross per 24 hour  Intake 145 ml  Output 2501 ml  Net -2356 ml     Filed Weights   02/13/21 0424 02/14/21 0319 02/15/21 1350  Weight: 102 kg 104.1 kg 102.1 kg    Examination:  General exam: Chronically ill male appears older than stated age, AAO x2, mild cognitive deficits HEENT: Left IJ HD catheter noted CVS: S1-S2, regular rate rhythm Lungs: Clear bilaterally Abdomen: Soft,  nontender, bowel sounds present Extremities : Right thigh wound with slough, surrounding erythema tenderness, proximal induration, prior transmetatarsal amputation of right foot and amputation of 2 toes on left foot  Skin: As above Psychiatry: Poor insight   Data Reviewed:   CBC: Recent Labs  Lab 02/10/21 0232 02/11/21 0116 02/12/21 0124  02/14/21 0128 02/15/21 0134  WBC 12.9* 13.2* 11.8* 10.5 11.9*  HGB 8.2* 9.0* 8.4* 8.3* 8.0*  HCT 27.2* 29.3* 27.6* 28.2* 27.1*  MCV 93.5 92.4 92.9 95.3 94.8  PLT 213 207 219 198 [id number]   Basic Metabolic Panel: Recent Labs  Lab 02/08/21 1438 02/09/21 0044 02/10/21 0232 02/12/21 0124 02/15/21 0134  NA 138 137 136 130* 132*  K 3.2* 3.5 3.6 4.1 4.5  CL 103 100 100 97* 98  CO2 24 21* 21* 23 23  GLUCOSE 88 92 124* 144* 94  BUN 16 20 33* 30* 42*  CREATININE 3.40* 4.10* 5.32* 4.54* 5.38*  CALCIUM 8.6* 8.3* 8.7* 8.3* 8.3*  PHOS 2.0*  --   --   --   --    GFR: Estimated Creatinine Clearance: 16.4 mL/min (A) (by C-G formula based on SCr of 5.38 mg/dL (H)). Liver Function Tests: Recent Labs  Lab 02/08/21 1438  ALBUMIN 2.5*   No results for input(s): LIPASE, AMYLASE in the last 168 hours. No results for input(s): AMMONIA in the last 168 hours. Coagulation Profile: No results for input(s): INR, PROTIME in the last 168 hours.  Cardiac Enzymes: No results for input(s): CKTOTAL, CKMB, CKMBINDEX, TROPONINI in the last 168 hours. BNP (last 3 results) No results for input(s): PROBNP in the last 8760 hours. HbA1C: No results for input(s): HGBA1C in the last 72 hours.  CBG: Recent Labs  Lab 02/14/21 1243 02/14/21 1633 02/14/21 2105 02/15/21 0811 02/15/21 1327  GLUCAP 115* 110* 136* 105* 82   Lipid Profile: No results for input(s): CHOL, HDL, LDLCALC, TRIG, CHOLHDL, LDLDIRECT in the last 72 hours. Thyroid Function Tests: No results for input(s): TSH, T4TOTAL, FREET4, T3FREE, THYROIDAB in the last 72 hours. Anemia Panel: No results for input(s): VITAMINB12, FOLATE, FERRITIN, TIBC, IRON, RETICCTPCT in the last 72 hours. Urine analysis: No results found for: COLORURINE, APPEARANCEUR, LABSPEC, PHURINE, GLUCOSEU, HGBUR, BILIRUBINUR, KETONESUR, PROTEINUR, UROBILINOGEN, NITRITE, LEUKOCYTESUR Sepsis Labs: '@LABRCNTIP'$ (procalcitonin:4,lacticidven:4)  ) Recent Results (from the past  240 hour(s))  Resp Panel by RT-PCR (Flu A&B, Covid) Nasopharyngeal Swab     Status: None   Collection Time: 02/16/2021  9:55 AM   Specimen: Nasopharyngeal Swab; Nasopharyngeal(NP) swabs in vial transport medium  Result Value Ref Range Status   SARS Coronavirus 2 by RT PCR NEGATIVE NEGATIVE Final    Comment: (NOTE) SARS-CoV-2 target nucleic acids are NOT DETECTED.  The SARS-CoV-2 RNA is generally detectable in upper respiratory specimens during the acute phase of infection. The lowest concentration of SARS-CoV-2 viral copies this assay can detect is 138 copies/mL. A negative result does not preclude SARS-Cov-2 infection and should not be used as the sole basis for treatment or other patient management decisions. A negative result may occur with  improper specimen collection/handling, submission of specimen other than nasopharyngeal swab, presence of viral mutation(s) within the areas targeted by this assay, and inadequate number of viral copies(<138 copies/mL). A negative result must be combined with clinical observations, patient history, and epidemiological information. The expected result is Negative.  Fact Sheet for Patients:  EntrepreneurPulse.com.au  Fact Sheet for Healthcare Providers:  IncredibleEmployment.be  This test is no t yet approved or cleared by the [place]  States FDA and  has been authorized for detection and/or diagnosis of SARS-CoV-2 by FDA under an Emergency Use Authorization (EUA). This EUA will remain  in effect (meaning this test can be used) for the duration of the COVID-19 declaration under Section 564(b)(1) of the Act, 21 U.S.C.section 360bbb-3(b)(1), unless the authorization is terminated  or revoked sooner.       Influenza A by PCR NEGATIVE NEGATIVE Final   Influenza B by PCR NEGATIVE NEGATIVE Final    Comment: (NOTE) The Xpert Xpress SARS-CoV-2/FLU/RSV plus assay is intended as an aid in the diagnosis of influenza  from Nasopharyngeal swab specimens and should not be used as a sole basis for treatment. Nasal washings and aspirates are unacceptable for Xpert Xpress SARS-CoV-2/FLU/RSV testing.  Fact Sheet for Patients: EntrepreneurPulse.com.au  Fact Sheet for Healthcare Providers: IncredibleEmployment.be  This test is not yet approved or cleared by the [place] FDA and has been authorized for detection and/or diagnosis of SARS-CoV-2 by FDA under an Emergency Use Authorization (EUA). This EUA will remain in effect (meaning this test can be used) for the duration of the COVID-19 declaration under Section 564(b)(1) of the Act, 21 U.S.C. section 360bbb-3(b)(1), unless the authorization is terminated or revoked.  Performed at [facility], [place] [address], [place], [place] [address]   Blood Culture (routine x 2)     Status: None   Collection Time: 02/20/2021 11:10 AM   Specimen: BLOOD  Result Value Ref Range Status   Specimen Description BLOOD RIGHT ANTECUBITAL  Final   Special Requests   Final    BOTTLES DRAWN AEROBIC ONLY Blood Culture adequate volume   Culture   Final    NO GROWTH 5 DAYS Performed at [facility], [place] [address], [place], [place] [address]    Report Status 02/12/2021 FINAL  Final     Scheduled Meds:  (feeding supplement) PROSource Plus  30 mL Oral BID BM   amiodarone  200 mg Oral BID   atorvastatin  40 mg Oral QPM   chlorhexidine  15 mL Mouth Rinse BID   Chlorhexidine Gluconate Cloth  6 each Topical Q0600   cholestyramine light  4 g Oral BID   collagenase   Topical Daily   darbepoetin (ARANESP) injection - DIALYSIS  150 mcg Intravenous Q Tue-HD   fentaNYL  1 patch Transdermal Q72H   ferric citrate  210 mg Oral BID WC   heparin sodium (porcine)       insulin aspart  0-5 Units Subcutaneous QHS   insulin aspart  0-9 Units Subcutaneous TID WC   insulin glargine-yfgn  5 Units Subcutaneous QHS   latanoprost  1 drop Both  Eyes QHS   levothyroxine  25 mcg Oral QAC breakfast   mouth rinse  15 mL Mouth Rinse q12n4p   metoprolol succinate  12.5 mg Oral Daily   midodrine  10 mg Oral TID WC   multivitamin  1 tablet Oral QHS   pantoprazole  40 mg Oral Q0600   pregabalin  25 mg Oral BID   Continuous Infusions:  ceFEPime (MAXIPIME) IV 2 g (02/15/21 1248)   vancomycin       LOS: 8 days   Time spent: 31mn  PDomenic Polite MD [facility]   02/15/2021, 2:19 PM

## 2021-02-15 NOTE — Progress Notes (Addendum)
Pt was disoriented to the reality of his situation at the beginning of night shift. Felt he was healed and was ready to leave the hospital and go home. Became a bit demanding pulled off telemetry monitor, pulled out IV, sustained a small skin tear to left mid arm. He was called by nurse and with the help of wife over telephone decided he would stay through night and cooperate with meds but not have IV or telemetry.  -- Provider on call, J Mansy with [facility], was notified at that time.  At  this time pt called nurse to room - apologized for going "bezerk"  at the beginning of night and states he is ready to proceed with all medical and nursing care and treatments. Telemetry reapplied.

## 2021-02-15 NOTE — Plan of Care (Signed)

## 2021-02-15 NOTE — Progress Notes (Signed)
OT Cancellation Note  Patient Details Name: Kyle Walton MRN: [id number] DOB: 1956-01-10   Cancelled Treatment:    Reason Eval/Treat Not Completed: Patient at procedure or test/ unavailable: Pt off floor for HD.  Julien Girt 02/15/2021, 12:13 PM

## 2021-02-15 NOTE — Progress Notes (Signed)
[facility] Progress Note   Subjective: Seen on HD. Pleasant, No C/Os.      Objective Vitals:   02/15/21 0850 02/15/21 0900 02/15/21 0927 02/15/21 1000  BP: (!) 112/50 (!) 117/58 (!) 126/58 (!) 121/57  Pulse: 82 88 84 91  Resp:      Temp:      TempSrc:      SpO2:      Weight:      Height:       Physical Exam General: Chronically ill appearing male in NAD Heart: S1,S2 irreg No M/R/G Lungs: CTAB Anteriorly Abdomen: obese with edema lower abd but improved from admission Extremities: still with some LE edema today Toe amps both feet. Has Wound R lateral drsg intact. Erythema RLE improved.  Dialysis Access: Kyle Walton blood lines connected   Additional Objective Labs: Basic Metabolic Panel: Recent Labs  Lab 02/08/21 1438 02/09/21 0044 02/10/21 0232 02/12/21 0124 02/15/21 0134  NA 138   < > 136 130* 132*  K 3.2*   < > 3.6 4.1 4.5  CL 103   < > 100 97* 98  CO2 24   < > 21* 23 23  GLUCOSE 88   < > 124* 144* 94  BUN 16   < > 33* 30* 42*  CREATININE 3.40*   < > 5.32* 4.54* 5.38*  CALCIUM 8.6*   < > 8.7* 8.3* 8.3*  PHOS 2.0*  --   --   --   --    < > = values in this interval not displayed.   Liver Function Tests: Recent Labs  Lab 02/08/21 1438  ALBUMIN 2.5*   No results for input(s): LIPASE, AMYLASE in the last 168 hours. CBC: Recent Labs  Lab 02/10/21 0232 02/11/21 0116 02/12/21 0124 02/14/21 0128 02/15/21 0134  WBC 12.9* 13.2* 11.8* 10.5 11.9*  HGB 8.2* 9.0* 8.4* 8.3* 8.0*  HCT 27.2* 29.3* 27.6* 28.2* 27.1*  MCV 93.5 92.4 92.9 95.3 94.8  PLT 213 207 219 198 214   Blood Culture    Component Value Date/Time   SDES BLOOD RIGHT ANTECUBITAL 02/15/2021 1110   SPECREQUEST  02/09/2021 1110    BOTTLES DRAWN AEROBIC ONLY Blood Culture adequate volume   CULT  02/20/2021 1110    NO GROWTH 5 DAYS Performed at [facility], [place] [address] [place], [place] [address]    REPTSTATUS 02/12/2021 FINAL 02/10/2021 1110    Cardiac Enzymes: No  results for input(s): CKTOTAL, CKMB, CKMBINDEX, TROPONINI in the last 168 hours. CBG: Recent Labs  Lab 02/14/21 0727 02/14/21 1243 02/14/21 1633 02/14/21 2105 02/15/21 0811  GLUCAP 155* 115* 110* 136* 105*   Iron Studies: No results for input(s): IRON, TIBC, TRANSFERRIN, FERRITIN in the last 72 hours. '@lablastinr3'$ @ Studies/Results: No results found. Medications:  ceFEPime (MAXIPIME) IV 2 g (02/12/21 1651)   vancomycin      (feeding supplement) PROSource Plus  30 mL Oral BID BM   amiodarone  200 mg Oral BID   atorvastatin  40 mg Oral QPM   chlorhexidine  15 mL Mouth Rinse BID   Chlorhexidine Gluconate Cloth  6 each Topical Q0600   cholestyramine light  4 g Oral BID   collagenase   Topical Daily   darbepoetin (ARANESP) injection - DIALYSIS  150 mcg Intravenous Q Tue-HD   fentaNYL  1 patch Transdermal Q72H   ferric citrate  210 mg Oral BID WC   insulin aspart  0-5 Units Subcutaneous QHS   insulin aspart  0-9 Units Subcutaneous TID WC   insulin glargine-yfgn  5 Units Subcutaneous QHS   latanoprost  1 drop Both Eyes QHS   levothyroxine  25 mcg Oral QAC breakfast   mouth rinse  15 mL Mouth Rinse q12n4p   metoprolol succinate  12.5 mg Oral Daily   midodrine       midodrine  10 mg Oral TID WC   multivitamin  1 tablet Oral QHS   pantoprazole  40 mg Oral Q0600   pregabalin  25 mg Oral BID     Dialysis Orders: [facility]: [facility] MWF= changed to TTS in hospital 4 hrs F250 450/800 3.0K/2.5 Ca [facility] TDC  EDW 111 kg -Heparin 4900 units IV initial bolus, 500 units hourly, DC last hour of tx   Assessment/Plan:  R. Thigh Abscess: Continues cepepime and vanc.  Afebrile last 48 hours Per primary.  ESRD - MWF however was switched to TTS here d/t high MWF census. Will change back to MWF as he approaches DC. Next HD 02/15/2021  Hypertension/volume-Have attempted to challenge and lower EDW since admit. Nadir wt 101.5 kg 02/12/21. Using Midodrine with extra 10 mg PO mid run to offset intradialyic  hypotension. Continue lowering volume as tolerated. Concerned being in AFIB may impede progress.  Anemia  - HGB 8.3.  started Aranesp 150 mcg IV with HD 02/08/21. Follow trends.  Metabolic bone disease -  C Ca high, VDRA is on hold. PO4 2.0. DC Calcium acetate, decrease auryxia to 1 tab PO BID for now.  No recent RFP. Order with HD tomorrow.   Nutrition - Albumin 2.5  Renal/Carb mod diet with protein supps, renal vits. DMT2-per primary Hypothyroidism-per primary H/O PAF: On amiodarone, apixaban and low-dose metoprolol and was scheduled for cardioversion but this has not happened yet. Patient requests that cardiology be made aware of admission.    10. Disposition: Does NOT wish to return to previous SNF. Wanting SNF closer to his home in [place].   Kyle Schnitzler H. Tyjon Bowen NP-C 02/15/2021, 10:13 AM  [employer] [phone]

## 2021-02-15 NOTE — Plan of Care (Signed)
  Problem: Clinical Measurements: Goal: Ability to maintain clinical measurements within normal limits will improve Outcome: Progressing   Problem: Clinical Measurements: Goal: Cardiovascular complication will be avoided Outcome: Progressing   Problem: Clinical Measurements: Goal: Respiratory complications will improve Outcome: Progressing   Problem: Clinical Measurements: Goal: Diagnostic test results will improve Outcome: Progressing   Problem: Activity: Goal: Risk for activity intolerance will decrease Outcome: Progressing   Problem: Nutrition: Goal: Adequate nutrition will be maintained Outcome: Progressing   Problem: Coping: Goal: Level of anxiety will decrease Outcome: Progressing   Problem: Pain Managment: Goal: General experience of comfort will improve Outcome: Progressing   Problem: Safety: Goal: Ability to remain free from injury will improve Outcome: Progressing   Problem: Skin Integrity: Goal: Risk for impaired skin integrity will decrease Outcome: Progressing   Problem: Fluid Volume: Goal: Ability to maintain a balanced intake and output will improve Outcome: Progressing

## 2021-02-16 DIAGNOSIS — T148XXA Other injury of unspecified body region, initial encounter: Secondary | ICD-10-CM | POA: Diagnosis not present

## 2021-02-16 DIAGNOSIS — L089 Local infection of the skin and subcutaneous tissue, unspecified: Secondary | ICD-10-CM

## 2021-02-16 DIAGNOSIS — I4819 Other persistent atrial fibrillation: Secondary | ICD-10-CM | POA: Diagnosis not present

## 2021-02-16 LAB — GLUCOSE, CAPILLARY
Glucose-Capillary: 138 mg/dL — ABNORMAL HIGH (ref 70–99)
Glucose-Capillary: 123 mg/dL — ABNORMAL HIGH (ref 70–99)
Glucose-Capillary: 149 mg/dL — ABNORMAL HIGH (ref 70–99)
Glucose-Capillary: 103 mg/dL — ABNORMAL HIGH (ref 70–99)

## 2021-02-16 LAB — CBC
HCT: 27 % — ABNORMAL LOW (ref 39.0–52.0)
Hemoglobin: 8.1 g/dL — ABNORMAL LOW (ref 13.0–17.0)
MCH: 28.2 pg (ref 26.0–34.0)
MCHC: 30 g/dL (ref 30.0–36.0)
MCV: 94.1 fL (ref 80.0–100.0)
Platelets: 232 10*3/uL (ref 150–400)
RBC: 2.87 MIL/uL — ABNORMAL LOW (ref 4.22–5.81)
RDW: 20.8 % — ABNORMAL HIGH (ref 11.5–15.5)
WBC: 11.9 10*3/uL — ABNORMAL HIGH (ref 4.0–10.5)
nRBC: 0 % (ref 0.0–0.2)

## 2021-02-16 LAB — BASIC METABOLIC PANEL
Anion gap: 9 (ref 5–15)
BUN: 24 mg/dL — ABNORMAL HIGH (ref 8–23)
CO2: 25 mmol/L (ref 22–32)
Calcium: 7.8 mg/dL — ABNORMAL LOW (ref 8.9–10.3)
Chloride: 100 mmol/L (ref 98–111)
Creatinine, Ser: 3.77 mg/dL — ABNORMAL HIGH (ref 0.61–1.24)
GFR, Estimated: 17 mL/min — ABNORMAL LOW (ref >60)
Glucose, Bld: 147 mg/dL — ABNORMAL HIGH (ref 70–99)
Potassium: 3.5 mmol/L (ref 3.5–5.1)
Sodium: 134 mmol/L — ABNORMAL LOW (ref 135–145)

## 2021-02-16 MED ORDER — MUPIROCIN 2 % EX OINT
1.0000 "application " | TOPICAL_OINTMENT | Freq: Two times a day (BID) | CUTANEOUS | Status: AC
  Administered 2021-02-17 – 2021-02-21 (×8): 1 via NASAL
  Filled 2021-02-16 (×2): qty 22

## 2021-02-16 MED ORDER — VANCOMYCIN HCL IN DEXTROSE 750-5 MG/150ML-% IV SOLN: 750.0000 mg | INTRAVENOUS | Status: DC

## 2021-02-16 MED ORDER — CHLORHEXIDINE GLUCONATE 4 % EX LIQD
60.0000 mL | Freq: Once | CUTANEOUS | Status: AC
  Administered 2021-02-17: 4 via TOPICAL
  Filled 2021-02-16: qty 60

## 2021-02-16 MED ORDER — CEFAZOLIN SODIUM-DEXTROSE 2-4 GM/100ML-% IV SOLN
2.0000 g | INTRAVENOUS | Status: DC
  Filled 2021-02-16: qty 100

## 2021-02-16 MED ORDER — SACCHAROMYCES BOULARDII 250 MG PO CAPS
250.0000 mg | ORAL_CAPSULE | Freq: Two times a day (BID) | ORAL | Status: DC
  Administered 2021-02-16 – 2021-03-01 (×27): 250 mg via ORAL
  Filled 2021-02-16 (×27): qty 1

## 2021-02-16 MED ORDER — SODIUM CHLORIDE 0.9 % IV SOLN
2.0000 g | INTRAVENOUS | Status: DC
Start: 2021-02-18 — End: 2021-02-18
  Administered 2021-02-18: 2 g via INTRAVENOUS
  Filled 2021-02-16: qty 2

## 2021-02-16 MED ORDER — GERHARDT'S BUTT CREAM
TOPICAL_CREAM | Freq: Three times a day (TID) | CUTANEOUS | Status: DC
  Administered 2021-02-16: 1 via TOPICAL
  Filled 2021-02-16 (×2): qty 1

## 2021-02-16 NOTE — Evaluation (Signed)
Physical Therapy Evaluation Patient Details Name: Kyle Walton MRN: [id number] DOB: 1956/07/01 Today's Date: 02/16/2021   History of Present Illness  Pt is a 65 year old man admitted on 02/21/2021 with non healing R thigh wound. He was admitted in July R thigh infection with hospital course complicated by post op acute respiratior failure, he discharged to SNF for rehab. PMH: ESRD, DM2, morbid obesity, CHF, PAF, chronic hypoxic respiratory failure and hypothyroidism.   Clinical Impression  Patient received in bed, call light on. He reports he is soiled. Patient is able to roll with mod independence for cleaning. Performed side-lying to sit with mod assist. Able to sit edge of bed with supervision. Requires +2 max assist to stand from elevated bed. Slow to rise and only able to stand for brief time. Patient will continue to benefit from skilled PT while here to improve strength, functional mobility and independence.        Follow Up Recommendations SNF    Equipment Recommendations  Other (comment) (to be determined next venue)    Recommendations for Other Services       Precautions / Restrictions Precautions Precautions: Fall Precaution Comments: contact precautions; Also noteworthy that lateral aspect of R thigh is very painful and sensitive to even light touch, legally blind Restrictions Weight Bearing Restrictions: No      Mobility  Bed Mobility Overal bed mobility: Needs Assistance Bed Mobility: Rolling;Sidelying to Sit;Sit to Supine Rolling: Modified independent (Device/Increase time) Sidelying to sit: Mod assist   Sit to supine: Mod assist   General bed mobility comments: assisted to raise trunk by pulling up on therapist's hand and for LEs back into bed, Pt able to reposition himself up in bed with headboard.    Transfers Overall transfer level: Needs assistance Equipment used: Rolling walker (2 wheeled) Transfers: Sit to/from Stand Sit to Stand: Max assist;From  elevated surface;+2 physical assistance         General transfer comment: heavy max assist to rise and steady from elevated bed, pt only able to perform x 1. Able to stand briefly. Unable to weight shift. Could not get to standing on second attempt  Ambulation/Gait             General Gait Details: unable  Stairs            Wheelchair Mobility    Modified Rankin (Stroke Patients Only)       Balance Overall balance assessment: Needs assistance Sitting-balance support: Feet supported Sitting balance-Leahy Scale: Fair Sitting balance - Comments: MinA for dynamic sitting balance Postural control: Posterior lean Standing balance support: Bilateral upper extremity supported;During functional activity Standing balance-Leahy Scale: Poor Standing balance comment: B UE and external support                             Pertinent Vitals/Pain Pain Assessment: Faces Faces Pain Scale: Hurts even more Pain Location: buttocks with pericare Pain Descriptors / Indicators: Discomfort;Sore;Grimacing;Guarding Pain Intervention(s): Monitored during session;Repositioned    Home Living Family/patient expects to be discharged to:: Skilled nursing facility Living Arrangements: Spouse/significant other               Additional Comments: pt reports he has not walked in 6 weeks, but later stated he could walk 15 feet in the SNF and was doing sliding board transfers    Prior Function Level of Independence: Needs assistance   Gait / Transfers Assistance Needed: in April, pt ambulated with a  rollator and used a hoveround. Thinks he has been ambulating 20' recently but question accuracy of this  ADL's / Homemaking Assistance Needed: Previous note states pt was independent with ADL  Comments: uses rollator, has a hoverround     Hand Dominance   Dominant Hand: Right    Extremity/Trunk Assessment   Upper Extremity Assessment Upper Extremity Assessment: Defer to OT  evaluation    Lower Extremity Assessment Lower Extremity Assessment: Generalized weakness    Cervical / Trunk Assessment Cervical / Trunk Assessment: Kyphotic  Communication   Communication: No difficulties  Cognition Arousal/Alertness: Awake/alert Behavior During Therapy: WFL for tasks assessed/performed Overall Cognitive Status: No family/caregiver present to determine baseline cognitive functioning Area of Impairment: Memory;Following commands;Safety/judgement;Awareness;Problem solving                     Memory: Decreased short-term memory Following Commands: Follows one step commands with increased time;Follows multi-step commands inconsistently Safety/Judgement: Decreased awareness of safety;Decreased awareness of deficits Awareness: Intellectual Problem Solving: Slow processing;Requires verbal cues;Difficulty sequencing General Comments: poor historian. Difficulty understanding prior funcitonal level.      General Comments General comments (skin integrity, edema, etc.): pt with skin breakdown on buttocks    Exercises Other Exercises Other Exercises: B LE exercises: ap, heel slides, hip abd/add x 10 reps each ( min assist), SLR x 5 reps each.   Assessment/Plan    PT Assessment Patient needs continued PT services  PT Problem List Decreased strength;Decreased range of motion;Decreased activity tolerance;Decreased balance;Decreased mobility;Decreased coordination;Decreased cognition;Decreased knowledge of use of DME;Decreased safety awareness;Decreased skin integrity;Obesity;Pain;Decreased knowledge of precautions       PT Treatment Interventions DME instruction;Gait training;Stair training;Functional mobility training;Therapeutic activities;Therapeutic exercise;Balance training;Neuromuscular re-education;Cognitive remediation;Patient/family education;Wheelchair mobility training    PT Goals (Current goals can be found in the Care Plan section)  Acute Rehab PT  Goals Patient Stated Goal: To be able to walk again PT Goal Formulation: With patient Time For Goal Achievement: 02/05/21 Potential to Achieve Goals: Fair    Frequency Min 3X/week   Barriers to discharge Inaccessible home environment;Decreased caregiver support      Co-evaluation PT/OT/SLP Co-Evaluation/Treatment: Yes Reason for Co-Treatment: For patient/therapist safety;Necessary to address cognition/behavior during functional activity;To address functional/ADL transfers PT goals addressed during session: Mobility/safety with mobility;Balance;Proper use of DME OT goals addressed during session: ADL's and self-care       AM-PAC PT "6 Clicks" Mobility  Outcome Measure Help needed turning from your back to your side while in a flat bed without using bedrails?: A Lot Help needed moving from lying on your back to sitting on the side of a flat bed without using bedrails?: A Lot Help needed moving to and from a bed to a chair (including a wheelchair)?: Total Help needed standing up from a chair using your arms (e.g., wheelchair or bedside chair)?: A Lot Help needed to walk in hospital room?: Total Help needed climbing 3-5 steps with a railing? : Total 6 Click Score: 9    End of Session   Activity Tolerance: Patient tolerated treatment well Patient left: in bed;with call bell/phone within reach Nurse Communication: Mobility status PT Visit Diagnosis: Muscle weakness (generalized) (M62.81);Pain;Other abnormalities of gait and mobility (R26.89);Unsteadiness on feet (R26.81) Pain - Right/Left: Right Pain - part of body: Leg (bottom)    Time: [id number] PT Time Calculation (min) (ACUTE ONLY): 38 min   Charges:   PT Evaluation $PT Eval Moderate Complexity: 1 Mod PT Treatments $Therapeutic Activity: 8-22 mins  Elie Leppo, PT, GCS 02/16/21,1:03 PM

## 2021-02-16 NOTE — Progress Notes (Addendum)
[facility] Progress Note   Subjective: Seen in room. Looks much better. Net UF 2.5 liters in HD WT down to 102 kg. He is excited about this. No C/Os. Going for I & D of R thigh wound today.   Objective Vitals:   02/16/21 0000 02/16/21 0354 02/16/21 0450 02/16/21 0721  BP:    (!) 114/47  Pulse:  100  99  Resp:    18  Temp: 98.8 F (37.1 C) (!) 97.5 F (36.4 C)  98.8 F (37.1 C)  TempSrc: Oral Oral  Oral  SpO2:    98%  Weight:   102.3 kg   Height:       Physical Exam General: Chronically ill appearing male in NAD Heart: S1,S2 irreg No M/R/G Lungs: CTAB Anteriorly Abdomen: obese with edema lower abd but improved from admission Extremities: No LE edema today Toe amps both feet. Has Wound R lateral drsg intact. Erythema RLE improved.  Dialysis Access: [facility] blood lines connected   Additional Objective Labs: Basic Metabolic Panel: Recent Labs  Lab 02/12/21 0124 02/15/21 0134 02/16/21 0242  NA 130* 132* 134*  K 4.1 4.5 3.5  CL 97* 98 100  CO2 '23 23 25  '$ GLUCOSE 144* 94 147*  BUN 30* 42* 24*  CREATININE 4.54* 5.38* 3.77*  CALCIUM 8.3* 8.3* 7.8*   Liver Function Tests: No results for input(s): AST, ALT, ALKPHOS, BILITOT, PROT, ALBUMIN in the last 168 hours. No results for input(s): LIPASE, AMYLASE in the last 168 hours. CBC: Recent Labs  Lab 02/11/21 0116 02/12/21 0124 02/14/21 0128 02/15/21 0134 02/16/21 0242  WBC 13.2* 11.8* 10.5 11.9* 11.9*  HGB 9.0* 8.4* 8.3* 8.0* 8.1*  HCT 29.3* 27.6* 28.2* 27.1* 27.0*  MCV 92.4 92.9 95.3 94.8 94.1  PLT 207 219 198 214 232   Blood Culture    Component Value Date/Time   SDES BLOOD RIGHT ANTECUBITAL 03/03/2021 1110   SPECREQUEST  02/15/2021 1110    BOTTLES DRAWN AEROBIC ONLY Blood Culture adequate volume   CULT  02/24/2021 1110    NO GROWTH 5 DAYS Performed at [facility], [place] [address] [place], [place] [address]    REPTSTATUS 02/12/2021 FINAL 02/28/2021 1110    Cardiac Enzymes: No  results for input(s): CKTOTAL, CKMB, CKMBINDEX, TROPONINI in the last 168 hours. CBG: Recent Labs  Lab 02/15/21 0811 02/15/21 1327 02/15/21 1755 02/15/21 2044 02/16/21 0719  GLUCAP 105* 82 93 98 138*   Iron Studies: No results for input(s): IRON, TIBC, TRANSFERRIN, FERRITIN in the last 72 hours. '@lablastinr3'$ @ Studies/Results: No results found. Medications:  ceFEPime (MAXIPIME) IV Stopped (02/15/21 1446)   vancomycin Stopped (02/15/21 1448)    (feeding supplement) PROSource Plus  30 mL Oral BID BM   amiodarone  200 mg Oral BID   atorvastatin  40 mg Oral QPM   chlorhexidine  15 mL Mouth Rinse BID   Chlorhexidine Gluconate Cloth  6 each Topical Q0600   cholestyramine light  4 g Oral BID   collagenase   Topical Daily   darbepoetin (ARANESP) injection - DIALYSIS  150 mcg Intravenous Q Tue-HD   fentaNYL  1 patch Transdermal Q72H   ferric citrate  210 mg Oral BID WC   insulin aspart  0-5 Units Subcutaneous QHS   insulin aspart  0-9 Units Subcutaneous TID WC   insulin glargine-yfgn  5 Units Subcutaneous QHS   latanoprost  1 drop Both Eyes QHS   levothyroxine  25 mcg Oral QAC breakfast   mouth  rinse  15 mL Mouth Rinse q12n4p   metoprolol succinate  12.5 mg Oral Daily   midodrine  10 mg Oral TID WC   multivitamin  1 tablet Oral QHS   pantoprazole  40 mg Oral Q0600   pregabalin  25 mg Oral BID     Dialysis Orders: Center: [facility] MWF= changed to TTS in hospital 4 hrs F250 450/800 3.0K/2.5 Ca [facility] TDC  EDW 111 kg -Heparin 4900 units IV initial bolus, 500 units hourly, DC last hour of tx   Assessment/Plan:  R. Thigh Abscess: Continues cepepime and vanc.  Afebrile last 48 hours Per primary.  ESRD - MWF however was switched to TTS here d/t high MWF census. Will change back to MWF as he approaches DC. Next HD 02/18/2021 due to staffing issues in HD unit.   Hypertension/volume-Have attempted to challenge and lower EDW since admit. Nadir wt 101.5 kg 02/12/21. Using Midodrine with extra  10 mg PO mid run to offset intradialyic hypotension. Continue lowering volume as tolerated.  Anemia  - HGB 8.1 Aranesp 150 mcg IV with HD 02/15/21. Follow trends.  Metabolic bone disease -  C Ca high, VDRA is on hold. PO4 2.0. DC Calcium acetate, decrease auryxia to 1 tab PO BID for now.  No recent RFP. Order with HD tomorrow.   Nutrition - Albumin 2.5  Renal/Carb mod diet with protein supps, renal vits. DMT2-per primary Hypothyroidism-per primary H/O PAF: On amiodarone, apixaban and low-dose metoprolol and was scheduled for cardioversion but this has not happened yet. Patient requests that cardiology be made aware of admission.    10. Disposition: Does NOT wish to return to previous SNF. Wanting SNF closer to his home in [place].   Rita H. Brown NP-C 02/16/2021, 9:33 AM  [facility] [phone]  Pt seen, examined and agree w A/P as above.  Kelly Splinter  MD 02/16/2021, 4:13 PM

## 2021-02-16 NOTE — Anesthesia Preprocedure Evaluation (Addendum)
Anesthesia Evaluation  Patient identified by MRN, date of birth, ID band Patient awake    Reviewed: Allergy & Precautions, NPO status , Patient's Chart, lab work & pertinent test results, reviewed documented beta blocker date and time   Airway Mallampati: III  TM Distance: >3 FB Neck ROM: Full  Mouth opening: Limited Mouth Opening  Dental  (+) Chipped, Dental Advisory Given,    Pulmonary sleep apnea ,    Pulmonary exam normal breath sounds clear to auscultation       Cardiovascular hypertension, Pt. on home beta blockers and Pt. on medications + CAD, + Past MI, + CABG, + Peripheral Vascular Disease, +CHF and + DVT  Normal cardiovascular exam+ dysrhythmias Atrial Fibrillation + Cardiac Defibrillator  Rhythm:Irregular Rate:Normal  TTE 2022 There is mild to moderate global hypokinesis of the left ventricle.  LV ejection fraction = 30-35%.  Mild left ventricular hypertrophy  There is trace aortic regurgitation.  There is trace mitral regurgitation.  Mild pulmonary hypertension.  There is trace tricuspid regurgitation.  There is no pericardial effusion.  Difficulty to compare with the prior study due to different image  quality, Probably no significant change  Image Quality: Technically difficult.      Neuro/Psych negative neurological ROS  negative psych ROS   GI/Hepatic negative GI ROS, Neg liver ROS,   Endo/Other  diabetes, Type 2, Insulin DependentHypothyroidism   Renal/GU ESRF and DialysisRenal disease (MWF however was switched to TTS here d/t high MWF census. Will change back to MWF as he approaches DC. Next HD 02/18/2021)  negative genitourinary   Musculoskeletal negative musculoskeletal ROS (+)   Abdominal   Peds  Hematology  (+) Blood dyscrasia (on eliquis, plavix, hgb 8.1), anemia ,   Anesthesia Other Findings   Reproductive/Obstetrics                            Anesthesia  Physical Anesthesia Plan  ASA: 4  Anesthesia Plan: General   Post-op Pain Management:    Induction: Intravenous  PONV Risk Score and Plan: Ondansetron, Dexamethasone and Midazolam  Airway Management Planned: LMA and Oral ETT  Additional Equipment:   Intra-op Plan:   Post-operative Plan: Extubation in OR  Informed Consent: I have reviewed the patients History and Physical, chart, labs and discussed the procedure including the risks, benefits and alternatives for the proposed anesthesia with the patient or authorized representative who has indicated his/her understanding and acceptance.     Dental advisory given  Plan Discussed with: CRNA  Anesthesia Plan Comments:         Anesthesia Quick Evaluation
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
## 2021-02-16 NOTE — Evaluation (Signed)
Occupational Therapy Evaluation Patient Details Name: Kyle Walton MRN: [id number] DOB: [birth year] Today's Date: 02/16/2021    History of Present Illness Pt is a 65 year old man admitted on 03/08/2021 with non healing R thigh wound. He was admitted in July R thigh infection with hospital course complicated by post op acute respiratior failure, he discharged to SNF for rehab. PMH: ESRD, DM2, morbid obesity, CHF, PAF, chronic hypoxic respiratory failure and hypothyroidism.   Clinical Impression   Pt admitted from SNF where he was receiving rehab. He reports using a sliding board for transfers and also walking 15 feet with a RW. He was assisted for bathing, dressing and toileting. Pt presents with some inconsistencies in response to PLOF questions. He is highly focused on his frequent BMs and relayed this information to MD when she came in. Pt is generally weak needing moderate assistance for bed mobility and +2 max assist to stand from an elevated bed with RW. HR elevated to 128 with standing (112 bpm seated at EOB). Pt only able to stand x 1 before fatigue. Pt requires set up to total assist for ADL. He is highly motivated to regain his mobility and independence. Recommending return to SNF.    Follow Up Recommendations  SNF    Equipment Recommendations   (defer to next venue)    Recommendations for Other Services       Precautions / Restrictions Precautions Precautions: Fall      Mobility Bed Mobility Overal bed mobility: Needs Assistance Bed Mobility: Rolling;Sidelying to Sit;Sit to Supine Rolling: Modified independent (Device/Increase time) (with rail) Sidelying to sit: Mod assist   Sit to supine: Mod assist   General bed mobility comments: assisted to raise trunk by pulling up on therapist's hand and for LEs back into bed, Pt able to reposition himself up in bed with headboard.    Transfers Overall transfer level: Needs assistance Equipment used: Rolling walker (2  wheeled) Transfers: Sit to/from Stand Sit to Stand: +2 physical assistance;Max assist         General transfer comment: heavy max assist to rise and steady from elevated bed, pt only able to perform x 1    Balance Overall balance assessment: Needs assistance   Sitting balance-Leahy Scale: Fair     Standing balance support: Bilateral upper extremity supported;During functional activity Standing balance-Leahy Scale: Poor Standing balance comment: B UE and external support                           ADL either performed or assessed with clinical judgement   ADL Overall ADL's : Needs assistance/impaired Eating/Feeding: Independent;Bed level   Grooming: Supervision/safety;Sitting   Upper Body Bathing: Minimal assistance;Sitting   Lower Body Bathing: Total assistance;+2 for physical assistance;Sit to/from stand;Bed level   Upper Body Dressing : Minimal assistance;Sitting   Lower Body Dressing: Total assistance;Bed level       Toileting- Clothing Manipulation and Hygiene: Total assistance;Bed level Toileting - Clothing Manipulation Details (indicate cue type and reason): pt with frequent stools             Vision Patient Visual Report: No change from baseline       Perception     Praxis      Pertinent Vitals/Pain Pain Assessment: Faces Faces Pain Scale: Hurts even more Pain Location: buttocks with pericare Pain Descriptors / Indicators: Discomfort;Sore;Grimacing;Guarding Pain Intervention(s): Monitored during session;Repositioned     Hand Dominance Right   Extremity/Trunk Assessment Upper Extremity  Assessment Upper Extremity Assessment: Generalized weakness   Lower Extremity Assessment Lower Extremity Assessment: Defer to PT evaluation   Cervical / Trunk Assessment Cervical / Trunk Assessment: Kyphotic   Communication Communication Communication: No difficulties   Cognition Arousal/Alertness: Awake/alert Behavior During Therapy:  Impulsive Overall Cognitive Status: Impaired/Different from baseline Area of Impairment: Memory;Following commands;Safety/judgement;Awareness;Problem solving                     Memory: Decreased short-term memory Following Commands: Follows one step commands with increased time;Follows multi-step commands inconsistently Safety/Judgement: Decreased awareness of safety;Decreased awareness of deficits   Problem Solving: Slow processing;Requires verbal cues;Difficulty sequencing     General Comments  pt with skin breakdown on buttocks    Exercises     Shoulder Instructions      Home Living Family/patient expects to be discharged to:: Skilled nursing facility                                 Additional Comments: pt reports he has not walked in 6 weeks, but later stated he could walk 15 feet in the SNF and was doing sliding board transfers      Prior Functioning/Environment Level of Independence: Needs assistance                 OT Problem List: Decreased strength;Decreased cognition;Decreased safety awareness;Decreased knowledge of use of DME or AE;Impaired sensation;Obesity;Pain      OT Treatment/Interventions: Self-care/ADL training;DME and/or AE instruction;Patient/family education;Balance training;Therapeutic activities    OT Goals(Current goals can be found in the care plan section) Acute Rehab OT Goals Patient Stated Goal: To be able to walk again OT Goal Formulation: With patient Time For Goal Achievement: 03/02/21 Potential to Achieve Goals: Fair ADL Goals Pt Will Perform Grooming: with set-up;sitting Pt Will Perform Upper Body Bathing: with set-up;sitting Pt Will Perform Upper Body Dressing: with set-up;sitting Pt Will Transfer to Toilet: with +2 assist;with mod assist;bedside commode Additional ADL Goal #1: Pt will perform achieve sitting EOB using rail as needed in preparation for ADL.  OT Frequency: Min 2X/week   Barriers to D/C:             Co-evaluation PT/OT/SLP Co-Evaluation/Treatment: Yes Reason for Co-Treatment: For patient/therapist safety   OT goals addressed during session: ADL's and self-care      AM-PAC OT "6 Clicks" Daily Activity     Outcome Measure Help from another person eating meals?: None Help from another person taking care of personal grooming?: A Little Help from another person toileting, which includes using toliet, bedpan, or urinal?: Total Help from another person bathing (including washing, rinsing, drying)?: A Lot Help from another person to put on and taking off regular upper body clothing?: A Little Help from another person to put on and taking off regular lower body clothing?: Total 6 Click Score: 14   End of Session Equipment Utilized During Treatment: Rolling walker;Gait belt  Activity Tolerance: Patient tolerated treatment well Patient left: in bed;with call bell/phone within reach;with bed alarm set  OT Visit Diagnosis: Unsteadiness on feet (R26.81);Muscle weakness (generalized) (M62.81);Pain;Other symptoms and signs involving cognitive function                Time: [id number] OT Time Calculation (min): 31 min Charges:  OT General Charges $OT Visit: 1 Visit OT Evaluation $OT Eval Moderate Complexity: 1 Mod  Nestor Lewandowsky, OTR/L [place] Pager: [phone] Office: [phone]  [name] 02/16/2021, 11:17 AM

## 2021-02-16 NOTE — Progress Notes (Addendum)
PROGRESS NOTE    Kyle Walton  [id number] DOB: 16-Aug-1955 DOA: 02/19/2021 PCP: Patient, No Pcp Per (Inactive)   Brief Narrative: 54 chronically ill with ESRD, anemia of chronic disease, type 2 diabetes, chronic diastolic heart failure, paroxysmal A. fib on Eliquis, COPD chronic hypoxic respiratory failure on 2 L of oxygen, nocturnal BiPAP, hypothyroidism recently discharged from [facility] 7/26: Treated for right thigh abscess, underwent I&D on 7/14.  Completed 7 days of IV therapy inpatient and discharged to SNF on oral ciprofloxacin, which he will likely may not have started.  Was seen by the wound nurse 180/1, noted to have increasing swelling tenderness and drainage from the wound, subsequently sent to the ED.  In the emergency room was briefly hypotensive, subsequently stabilized, started on broad-spectrum antibiotics, Ortho consulted, initially recommended antibiotics.  Treated with broad-spectrum antibiotics plan for repeat debridement    Assessment & Plan:   Active Problems:   CAD (coronary artery disease)   Diabetes mellitus with peripheral vascular disease ([place])   ESRD on hemodialysis ([place])   Acquired hypothyroidism   Open thigh wound, right, sequela     Pressure Injury 12/11/20 Sacrum Right;Left;Mid Stage 2 -  Partial thickness loss of dermis presenting as a shallow open injury with a red, pink wound bed without slough. (Active)  12/11/20 0551  Location: Sacrum  Location Orientation: Right;Left;Mid  Staging: Stage 2 -  Partial thickness loss of dermis presenting as a shallow open injury with a red, pink wound bed without slough.  Wound Description (Comments):   Present on Admission:     1-Right Thigh Abscess:  -Recent I and D on 7/14, culture grew Serratia resistant to Ancef.  -Admitted with clinical worsening wound, CT concern for developing compartment syndrome, Substantially reduced volume of the mixed density collection right Vastus Lateralis. Locules of  subcutaneous gas density track along approximately 13 cm.  -Appreciate orthopedic follow-up, sutures removed to allow drainage -Plan for repeat I&D tomorrow, holding Eliquis and Plavix -Resume  anticoagulation when okay by Ortho  2-ESRD on hemodialysis: Dialysis TTS.  Diabetes type 2: Continue with Lantus  Chronic hypotension: Continue with midodrine  Chronic Diastolic heart failure: Volume managed with hemodialysis.  CAD/CABG: Beta-blocker and statin Hold Plavix for repeat I&D  Paroxysmal A. fib: Continue with Toprol amiodarone Continue Eliquis. Request cardiology consultation.  COPD/chronic respiratory failure: Continue with BiPAP nightly  History of PAD: Prior to amputation and metatarsal amputation.  Continue with statin and Plavix on hold  Anemia of chronic disease: Continue with Epogen with hemodialysis.  Loose stool; start florastore.  Estimated body mass index is 32.36 kg/m as calculated from the following:   Height as of this encounter: '5\' 10"'$  (1.778 m).   Weight as of this encounter: 102.3 kg.   DVT prophylaxis: SCD Code Status: Full Code Family Communication: care discussed with patient.  Disposition Plan:  Status is: Inpatient  Remains inpatient appropriate because:IV treatments appropriate due to intensity of illness or inability to take PO  Dispo: The patient is from: SNF              Anticipated d/c is to: SNF              Patient currently is not medically stable to d/c.   Difficult to place patient No        Consultants:  Ortho Nephrology Cardiology  Procedures:  I and D   Antimicrobials:    Subjective: He report multiples episode of stool. Denies abdominal pain.  Objective: Vitals:   02/16/21 0000 02/16/21 0354 02/16/21 0450 02/16/21 0721  BP:    (!) 114/47  Pulse:  100  99  Resp:    18  Temp: 98.8 F (37.1 C) (!) 97.5 F (36.4 C)  98.8 F (37.1 C)  TempSrc: Oral Oral  Oral  SpO2:    98%  Weight:   102.3 kg    Height:        Intake/Output Summary (Last 24 hours) at 02/16/2021 0745 Last data filed at 02/15/2021 1252 Gross per 24 hour  Intake --  Output 2500 ml  Net -2500 ml   Filed Weights   02/14/21 0319 02/15/21 1350 02/16/21 0450  Weight: 104.1 kg 102.1 kg 102.3 kg    Examination:  General exam: Appears calm and comfortable  Respiratory system: Clear to auscultation. Respiratory effort normal. Cardiovascular system: S1 & S2 heard, IRR. No JVD, murmurs, rubs, gallops or clicks. No pedal edema. Gastrointestinal system: Abdomen is nondistended, soft and nontender. No organomegaly or masses felt. Normal bowel sounds heard. Central nervous system: Alert and oriented. Extremities: right thigh with dressing.     Data Reviewed: I have personally reviewed following labs and imaging studies  CBC: Recent Labs  Lab 02/11/21 0116 02/12/21 0124 02/14/21 0128 02/15/21 0134 02/16/21 0242  WBC 13.2* 11.8* 10.5 11.9* 11.9*  HGB 9.0* 8.4* 8.3* 8.0* 8.1*  HCT 29.3* 27.6* 28.2* 27.1* 27.0*  MCV 92.4 92.9 95.3 94.8 94.1  PLT 207 219 198 214 [id number]   Basic Metabolic Panel: Recent Labs  Lab 02/10/21 0232 02/12/21 0124 02/15/21 0134 02/16/21 0242  NA 136 130* 132* 134*  K 3.6 4.1 4.5 3.5  CL 100 97* 98 100  CO2 21* '23 23 25  '$ GLUCOSE 124* 144* 94 147*  BUN 33* 30* 42* 24*  CREATININE 5.32* 4.54* 5.38* 3.77*  CALCIUM 8.7* 8.3* 8.3* 7.8*   GFR: Estimated Creatinine Clearance: 23.4 mL/min (A) (by C-G formula based on SCr of 3.77 mg/dL (H)). Liver Function Tests: No results for input(s): AST, ALT, ALKPHOS, BILITOT, PROT, ALBUMIN in the last 168 hours. No results for input(s): LIPASE, AMYLASE in the last 168 hours. No results for input(s): AMMONIA in the last 168 hours. Coagulation Profile: No results for input(s): INR, PROTIME in the last 168 hours. Cardiac Enzymes: No results for input(s): CKTOTAL, CKMB, CKMBINDEX, TROPONINI in the last 168 hours. BNP (last 3 results) No results for  input(s): PROBNP in the last 8760 hours. HbA1C: No results for input(s): HGBA1C in the last 72 hours. CBG: Recent Labs  Lab 02/15/21 0811 02/15/21 1327 02/15/21 1755 02/15/21 2044 02/16/21 0719  GLUCAP 105* 82 93 98 138*   Lipid Profile: No results for input(s): CHOL, HDL, LDLCALC, TRIG, CHOLHDL, LDLDIRECT in the last 72 hours. Thyroid Function Tests: No results for input(s): TSH, T4TOTAL, FREET4, T3FREE, THYROIDAB in the last 72 hours. Anemia Panel: No results for input(s): VITAMINB12, FOLATE, FERRITIN, TIBC, IRON, RETICCTPCT in the last 72 hours. Sepsis Labs: No results for input(s): PROCALCITON, LATICACIDVEN in the last 168 hours.  Recent Results (from the past 240 hour(s))  Resp Panel by RT-PCR (Flu A&B, Covid) Nasopharyngeal Swab     Status: None   Collection Time: 02/28/2021  9:55 AM   Specimen: Nasopharyngeal Swab; Nasopharyngeal(NP) swabs in vial transport medium  Result Value Ref Range Status   SARS Coronavirus 2 by RT PCR NEGATIVE NEGATIVE Final    Comment: (NOTE) SARS-CoV-2 target nucleic acids are NOT DETECTED.  The SARS-CoV-2 RNA is generally detectable  in upper respiratory specimens during the acute phase of infection. The lowest concentration of SARS-CoV-2 viral copies this assay can detect is 138 copies/mL. A negative result does not preclude SARS-Cov-2 infection and should not be used as the sole basis for treatment or other patient management decisions. A negative result may occur with  improper specimen collection/handling, submission of specimen other than nasopharyngeal swab, presence of viral mutation(s) within the areas targeted by this assay, and inadequate number of viral copies(<138 copies/mL). A negative result must be combined with clinical observations, patient history, and epidemiological information. The expected result is Negative.  Fact Sheet for Patients:  EntrepreneurPulse.com.au  Fact Sheet for Healthcare Providers:   IncredibleEmployment.be  This test is no t yet approved or cleared by the [place] FDA and  has been authorized for detection and/or diagnosis of SARS-CoV-2 by FDA under an Emergency Use Authorization (EUA). This EUA will remain  in effect (meaning this test can be used) for the duration of the COVID-19 declaration under Section 564(b)(1) of the Act, 21 U.S.C.section 360bbb-3(b)(1), unless the authorization is terminated  or revoked sooner.       Influenza A by PCR NEGATIVE NEGATIVE Final   Influenza B by PCR NEGATIVE NEGATIVE Final    Comment: (NOTE) The Xpert Xpress SARS-CoV-2/FLU/RSV plus assay is intended as an aid in the diagnosis of influenza from Nasopharyngeal swab specimens and should not be used as a sole basis for treatment. Nasal washings and aspirates are unacceptable for Xpert Xpress SARS-CoV-2/FLU/RSV testing.  Fact Sheet for Patients: EntrepreneurPulse.com.au  Fact Sheet for Healthcare Providers: IncredibleEmployment.be  This test is not yet approved or cleared by the [place] FDA and has been authorized for detection and/or diagnosis of SARS-CoV-2 by FDA under an Emergency Use Authorization (EUA). This EUA will remain in effect (meaning this test can be used) for the duration of the COVID-19 declaration under Section 564(b)(1) of the Act, 21 U.S.C. section 360bbb-3(b)(1), unless the authorization is terminated or revoked.  Performed at [facility], [place] [address] [place], [place] [address]   Blood Culture (routine x 2)     Status: None   Collection Time: 02/09/2021 11:10 AM   Specimen: BLOOD  Result Value Ref Range Status   Specimen Description BLOOD RIGHT ANTECUBITAL  Final   Special Requests   Final    BOTTLES DRAWN AEROBIC ONLY Blood Culture adequate volume   Culture   Final    NO GROWTH 5 DAYS Performed at [facility], [place] [address], [place],  [address]    Report  Status 02/12/2021 FINAL  Final         Radiology Studies: No results found.      Scheduled Meds:  (feeding supplement) PROSource Plus  30 mL Oral BID BM   amiodarone  200 mg Oral BID   atorvastatin  40 mg Oral QPM   chlorhexidine  15 mL Mouth Rinse BID   Chlorhexidine Gluconate Cloth  6 each Topical Q0600   cholestyramine light  4 g Oral BID   collagenase   Topical Daily   darbepoetin (ARANESP) injection - DIALYSIS  150 mcg Intravenous Q Tue-HD   fentaNYL  1 patch Transdermal Q72H   ferric citrate  210 mg Oral BID WC   insulin aspart  0-5 Units Subcutaneous QHS   insulin aspart  0-9 Units Subcutaneous TID WC   insulin glargine-yfgn  5 Units Subcutaneous QHS   latanoprost  1 drop Both Eyes QHS   levothyroxine  25 mcg Oral QAC breakfast   mouth rinse  15 mL Mouth Rinse q12n4p   metoprolol succinate  12.5 mg Oral Daily   midodrine  10 mg Oral TID WC   multivitamin  1 tablet Oral QHS   pantoprazole  40 mg Oral Q0600   pregabalin  25 mg Oral BID   Continuous Infusions:  ceFEPime (MAXIPIME) IV Stopped (02/15/21 1446)   vancomycin Stopped (02/15/21 1448)     LOS: 9 days    Time spent: 35  minutes.     Elmarie Shiley, MD [facility]   If 7PM-7AM, please contact night-coverage [email or web address]  02/16/2021, 7:45 AM

## 2021-02-17 ENCOUNTER — Inpatient Hospital Stay (HOSPITAL_COMMUNITY): Payer: Medicare HMO | Admitting: Anesthesiology

## 2021-02-17 ENCOUNTER — Encounter (HOSPITAL_COMMUNITY): Admission: EM | Disposition: E | Payer: Self-pay | Source: Skilled Nursing Facility | Attending: Internal Medicine

## 2021-02-17 DIAGNOSIS — T148XXA Other injury of unspecified body region, initial encounter: Secondary | ICD-10-CM

## 2021-02-17 DIAGNOSIS — L089 Local infection of the skin and subcutaneous tissue, unspecified: Principal | ICD-10-CM

## 2021-02-17 HISTORY — PX: I & D EXTREMITY: SHX5045

## 2021-02-17 HISTORY — PX: APPLICATION OF WOUND VAC: SHX5189

## 2021-02-17 HISTORY — PX: FACIAL LACERATION REPAIR: SHX6589

## 2021-02-17 LAB — CBC
HCT: 28 % — ABNORMAL LOW (ref 39.0–52.0)
Hemoglobin: 8.1 g/dL — ABNORMAL LOW (ref 13.0–17.0)
MCH: 27.6 pg (ref 26.0–34.0)
MCHC: 28.9 g/dL — ABNORMAL LOW (ref 30.0–36.0)
MCV: 95.6 fL (ref 80.0–100.0)
Platelets: 263 10*3/uL (ref 150–400)
RBC: 2.93 MIL/uL — ABNORMAL LOW (ref 4.22–5.81)
RDW: 20.5 % — ABNORMAL HIGH (ref 11.5–15.5)
WBC: 12 10*3/uL — ABNORMAL HIGH (ref 4.0–10.5)
nRBC: 0 % (ref 0.0–0.2)

## 2021-02-17 LAB — RENAL FUNCTION PANEL
Albumin: 1.8 g/dL — ABNORMAL LOW (ref 3.5–5.0)
Anion gap: 13 (ref 5–15)
BUN: 33 mg/dL — ABNORMAL HIGH (ref 8–23)
CO2: 19 mmol/L — ABNORMAL LOW (ref 22–32)
Calcium: 7.7 mg/dL — ABNORMAL LOW (ref 8.9–10.3)
Chloride: 102 mmol/L (ref 98–111)
Creatinine, Ser: 4.85 mg/dL — ABNORMAL HIGH (ref 0.61–1.24)
GFR, Estimated: 13 mL/min — ABNORMAL LOW (ref >60)
Glucose, Bld: 110 mg/dL — ABNORMAL HIGH (ref 70–99)
Phosphorus: 3.9 mg/dL (ref 2.5–4.6)
Potassium: 3.4 mmol/L — ABNORMAL LOW (ref 3.5–5.1)
Sodium: 134 mmol/L — ABNORMAL LOW (ref 135–145)

## 2021-02-17 LAB — SURGICAL PCR SCREEN
MRSA, PCR: NEGATIVE
Staphylococcus aureus: POSITIVE — AB

## 2021-02-17 LAB — GLUCOSE, CAPILLARY
Glucose-Capillary: 106 mg/dL — ABNORMAL HIGH (ref 70–99)
Glucose-Capillary: 111 mg/dL — ABNORMAL HIGH (ref 70–99)
Glucose-Capillary: 132 mg/dL — ABNORMAL HIGH (ref 70–99)
Glucose-Capillary: 178 mg/dL — ABNORMAL HIGH (ref 70–99)

## 2021-02-17 SURGERY — IRRIGATION AND DEBRIDEMENT EXTREMITY
Anesthesia: General | Site: Mouth | Laterality: Right

## 2021-02-17 MED ORDER — LIDOCAINE 2% (20 MG/ML) 5 ML SYRINGE
INTRAMUSCULAR | Status: AC
  Filled 2021-02-17: qty 5

## 2021-02-17 MED ORDER — ONDANSETRON HCL 4 MG/2ML IJ SOLN
INTRAMUSCULAR | Status: AC
  Filled 2021-02-17: qty 2

## 2021-02-17 MED ORDER — HYDROMORPHONE HCL 2 MG PO TABS
2.0000 mg | ORAL_TABLET | Freq: Four times a day (QID) | ORAL | Status: DC | PRN
  Administered 2021-02-17 – 2021-02-27 (×11): 2 mg via ORAL
  Filled 2021-02-17 (×13): qty 1

## 2021-02-17 MED ORDER — MIDAZOLAM HCL 2 MG/2ML IJ SOLN
INTRAMUSCULAR | Status: DC | PRN
  Administered 2021-02-17: 2 mg via INTRAVENOUS

## 2021-02-17 MED ORDER — LIDOCAINE HCL (PF) 1 % IJ SOLN
5.0000 mL | INTRAMUSCULAR | Status: DC | PRN
  Filled 2021-02-17: qty 5

## 2021-02-17 MED ORDER — METOCLOPRAMIDE HCL 10 MG PO TABS: 5.0000 mg | ORAL_TABLET | Freq: Three times a day (TID) | ORAL | Status: DC | PRN

## 2021-02-17 MED ORDER — HYDROMORPHONE HCL 1 MG/ML IJ SOLN
INTRAMUSCULAR | Status: AC
  Filled 2021-02-17: qty 1

## 2021-02-17 MED ORDER — APIXABAN 5 MG PO TABS
5.0000 mg | ORAL_TABLET | Freq: Two times a day (BID) | ORAL | Status: DC
  Administered 2021-02-17 – 2021-03-01 (×24): 5 mg via ORAL
  Filled 2021-02-17 (×24): qty 1

## 2021-02-17 MED ORDER — SODIUM CHLORIDE (PF) 0.9 % IJ SOLN
INTRAMUSCULAR | Status: AC
  Filled 2021-02-17: qty 10

## 2021-02-17 MED ORDER — PENTAFLUOROPROP-TETRAFLUOROETH EX AERO: 1.0000 "application " | INHALATION_SPRAY | CUTANEOUS | Status: DC | PRN

## 2021-02-17 MED ORDER — HYDROMORPHONE HCL 1 MG/ML IJ SOLN
0.2500 mg | INTRAMUSCULAR | Status: DC | PRN
  Administered 2021-02-17 (×2): 0.5 mg via INTRAVENOUS

## 2021-02-17 MED ORDER — ONDANSETRON HCL 4 MG/2ML IJ SOLN: 4.0000 mg | Freq: Four times a day (QID) | INTRAMUSCULAR | Status: DC | PRN

## 2021-02-17 MED ORDER — PROPOFOL 10 MG/ML IV BOLUS
INTRAVENOUS | Status: AC
  Filled 2021-02-17: qty 20

## 2021-02-17 MED ORDER — FENTANYL CITRATE (PF) 100 MCG/2ML IJ SOLN
25.0000 ug | INTRAMUSCULAR | Status: DC | PRN
  Administered 2021-02-17 (×2): 25 ug via INTRAVENOUS

## 2021-02-17 MED ORDER — SODIUM CHLORIDE 0.9 % IR SOLN
Status: DC | PRN
  Administered 2021-02-17: 6000 mL

## 2021-02-17 MED ORDER — ONDANSETRON HCL 4 MG/2ML IJ SOLN
INTRAMUSCULAR | Status: DC | PRN
  Administered 2021-02-17: 4 mg via INTRAVENOUS

## 2021-02-17 MED ORDER — GLYCOPYRROLATE 0.2 MG/ML IJ SOLN
INTRAMUSCULAR | Status: DC | PRN
  Administered 2021-02-17: .2 mg via INTRAVENOUS

## 2021-02-17 MED ORDER — ROCURONIUM 10MG/ML (10ML) SYRINGE FOR MEDFUSION PUMP - OPTIME
INTRAVENOUS | Status: DC | PRN
  Administered 2021-02-17: 50 mg via INTRAVENOUS

## 2021-02-17 MED ORDER — PHENYLEPHRINE HCL-NACL 20-0.9 MG/250ML-% IV SOLN
INTRAVENOUS | Status: DC | PRN
  Administered 2021-02-17: 50 ug/min via INTRAVENOUS

## 2021-02-17 MED ORDER — SUGAMMADEX SODIUM 200 MG/2ML IV SOLN
INTRAVENOUS | Status: DC | PRN
  Administered 2021-02-17: 200 mg via INTRAVENOUS

## 2021-02-17 MED ORDER — FENTANYL CITRATE (PF) 250 MCG/5ML IJ SOLN
INTRAMUSCULAR | Status: DC | PRN
  Administered 2021-02-17: 50 ug via INTRAVENOUS

## 2021-02-17 MED ORDER — LIDOCAINE HCL (CARDIAC) PF 100 MG/5ML IV SOSY
PREFILLED_SYRINGE | INTRAVENOUS | Status: DC | PRN
  Administered 2021-02-17: 100 mg via INTRATRACHEAL

## 2021-02-17 MED ORDER — ONDANSETRON HCL 4 MG PO TABS: 4.0000 mg | ORAL_TABLET | Freq: Four times a day (QID) | ORAL | Status: DC | PRN

## 2021-02-17 MED ORDER — ALTEPLASE 2 MG IJ SOLR: 2.0000 mg | Freq: Once | INTRAMUSCULAR | Status: DC | PRN

## 2021-02-17 MED ORDER — ACETAMINOPHEN 500 MG PO TABS
1000.0000 mg | ORAL_TABLET | Freq: Once | ORAL | Status: AC
  Administered 2021-02-17: 1000 mg via ORAL
  Filled 2021-02-17: qty 2

## 2021-02-17 MED ORDER — METOCLOPRAMIDE HCL 5 MG/ML IJ SOLN: 5.0000 mg | Freq: Three times a day (TID) | INTRAMUSCULAR | Status: DC | PRN

## 2021-02-17 MED ORDER — CHLORHEXIDINE GLUCONATE CLOTH 2 % EX PADS
6.0000 | MEDICATED_PAD | Freq: Every day | CUTANEOUS | Status: DC
Start: 2021-02-18 — End: 2021-03-01
  Administered 2021-02-18 – 2021-03-01 (×12): 6 via TOPICAL

## 2021-02-17 MED ORDER — GLYCOPYRROLATE PF 0.2 MG/ML IJ SOSY
PREFILLED_SYRINGE | INTRAMUSCULAR | Status: AC
  Filled 2021-02-17: qty 1

## 2021-02-17 MED ORDER — PROPOFOL 10 MG/ML IV BOLUS
INTRAVENOUS | Status: DC | PRN
  Administered 2021-02-17: 80 mg via INTRAVENOUS

## 2021-02-17 MED ORDER — FENTANYL CITRATE (PF) 100 MCG/2ML IJ SOLN
INTRAMUSCULAR | Status: AC
  Filled 2021-02-17: qty 2

## 2021-02-17 MED ORDER — SODIUM CHLORIDE 0.9 % IV SOLN: 100.0000 mL | INTRAVENOUS | Status: DC | PRN

## 2021-02-17 MED ORDER — MIDAZOLAM HCL 2 MG/2ML IJ SOLN
INTRAMUSCULAR | Status: AC
  Filled 2021-02-17: qty 2

## 2021-02-17 MED ORDER — PHENYLEPHRINE 40 MCG/ML (10ML) SYRINGE FOR IV PUSH (FOR BLOOD PRESSURE SUPPORT)
PREFILLED_SYRINGE | INTRAVENOUS | Status: AC
  Filled 2021-02-17: qty 10

## 2021-02-17 MED ORDER — LACTATED RINGERS IV SOLN: INTRAVENOUS | Status: DC | PRN

## 2021-02-17 MED ORDER — DOCUSATE SODIUM 100 MG PO CAPS
100.0000 mg | ORAL_CAPSULE | Freq: Two times a day (BID) | ORAL | Status: DC
Start: 2021-02-17 — End: 2021-02-27
  Administered 2021-02-17 – 2021-02-27 (×16): 100 mg via ORAL
  Filled 2021-02-17 (×18): qty 1

## 2021-02-17 MED ORDER — POVIDONE-IODINE 10 % EX SWAB: 2.0000 "application " | Freq: Once | CUTANEOUS | Status: DC

## 2021-02-17 MED ORDER — HEPARIN SODIUM (PORCINE) 1000 UNIT/ML DIALYSIS: 1000.0000 [IU] | INTRAMUSCULAR | Status: DC | PRN

## 2021-02-17 MED ORDER — HEPARIN SODIUM (PORCINE) 1000 UNIT/ML DIALYSIS: 20.0000 [IU]/kg | INTRAMUSCULAR | Status: DC | PRN

## 2021-02-17 MED ORDER — FENTANYL CITRATE (PF) 250 MCG/5ML IJ SOLN
INTRAMUSCULAR | Status: AC
  Filled 2021-02-17: qty 5

## 2021-02-17 MED ORDER — ROCURONIUM BROMIDE 10 MG/ML (PF) SYRINGE
PREFILLED_SYRINGE | INTRAVENOUS | Status: AC
  Filled 2021-02-17: qty 10

## 2021-02-17 MED ORDER — CEFAZOLIN SODIUM-DEXTROSE 2-3 GM-%(50ML) IV SOLR
INTRAVENOUS | Status: DC | PRN
  Administered 2021-02-17: 2 g via INTRAVENOUS

## 2021-02-17 MED ORDER — SODIUM CHLORIDE 0.9 % IR SOLN
Status: DC | PRN
  Administered 2021-02-17: 1000 mL

## 2021-02-17 MED ORDER — LIDOCAINE-PRILOCAINE 2.5-2.5 % EX CREA: 1.0000 "application " | TOPICAL_CREAM | CUTANEOUS | Status: DC | PRN

## 2021-02-17 SURGICAL SUPPLY — 65 items
ALCOHOL 70% 16 OZ (MISCELLANEOUS) ×3 IMPLANT
BAG COUNTER SPONGE SURGICOUNT (BAG) ×3 IMPLANT
BLADE SURG 10 STRL SS (BLADE) ×3 IMPLANT
BNDG COHESIVE 4X5 TAN STRL (GAUZE/BANDAGES/DRESSINGS) ×3 IMPLANT
BNDG ELASTIC 3X5.8 VLCR STR LF (GAUZE/BANDAGES/DRESSINGS) IMPLANT
BNDG GAUZE ELAST 4 BULKY (GAUZE/BANDAGES/DRESSINGS) ×9 IMPLANT
CORD BIPOLAR FORCEPS 12FT (ELECTRODE) IMPLANT
COVER SURGICAL LIGHT HANDLE (MISCELLANEOUS) ×3 IMPLANT
CUFF TOURN SGL QUICK 24 (TOURNIQUET CUFF)
CUFF TOURN SGL QUICK 34 (TOURNIQUET CUFF) ×2
CUFF TOURN SGL QUICK 42 (TOURNIQUET CUFF) IMPLANT
CUFF TRNQT CYL 24X4X16.5-23 (TOURNIQUET CUFF) IMPLANT
CUFF TRNQT CYL 34X4.125X (TOURNIQUET CUFF) ×4 IMPLANT
DRAPE EXTREMITY BILATERAL (DRAPES) IMPLANT
DRAPE IMP U-DRAPE 54X76 (DRAPES) IMPLANT
DRAPE INCISE IOBAN 66X45 STRL (DRAPES) ×11 IMPLANT
DRAPE SURG 17X23 STRL (DRAPES) IMPLANT
DRAPE U-SHAPE 47X51 STRL (DRAPES) ×3 IMPLANT
DRSG PAD ABDOMINAL 8X10 ST (GAUZE/BANDAGES/DRESSINGS) ×3 IMPLANT
DRSG VAC ATS MED SENSATRAC (GAUZE/BANDAGES/DRESSINGS) ×1 IMPLANT
DURAPREP 26ML APPLICATOR (WOUND CARE) ×3 IMPLANT
ELECT CAUTERY BLADE 6.4 (BLADE) ×3 IMPLANT
ELECT REM PT RETURN 9FT ADLT (ELECTROSURGICAL)
ELECTRODE REM PT RTRN 9FT ADLT (ELECTROSURGICAL) IMPLANT
FACESHIELD WRAPAROUND (MASK) IMPLANT
FACESHIELD WRAPAROUND OR TEAM (MASK) IMPLANT
GAUZE SPONGE 4X4 12PLY STRL (GAUZE/BANDAGES/DRESSINGS) ×6 IMPLANT
GAUZE XEROFORM 1X8 LF (GAUZE/BANDAGES/DRESSINGS) ×3 IMPLANT
GAUZE XEROFORM 5X9 LF (GAUZE/BANDAGES/DRESSINGS) ×3 IMPLANT
GLOVE SRG 8 PF TXTR STRL LF DI (GLOVE) ×4 IMPLANT
GLOVE SURG ENC MOIS LTX SZ7.5 (GLOVE) ×6 IMPLANT
GLOVE SURG UNDER POLY LF SZ8 (GLOVE) ×2
GOWN STRL REUS W/ TWL LRG LVL3 (GOWN DISPOSABLE) ×4 IMPLANT
GOWN STRL REUS W/ TWL XL LVL3 (GOWN DISPOSABLE) ×4 IMPLANT
GOWN STRL REUS W/TWL LRG LVL3 (GOWN DISPOSABLE) ×2
GOWN STRL REUS W/TWL XL LVL3 (GOWN DISPOSABLE) ×2
HANDPIECE INTERPULSE COAX TIP (DISPOSABLE)
KIT BASIN OR (CUSTOM PROCEDURE TRAY) ×3 IMPLANT
KIT TURNOVER KIT B (KITS) ×3 IMPLANT
MANIFOLD NEPTUNE II (INSTRUMENTS) ×3 IMPLANT
NS IRRIG 1000ML POUR BTL (IV SOLUTION) ×6 IMPLANT
PACK ORTHO EXTREMITY (CUSTOM PROCEDURE TRAY) ×3 IMPLANT
PAD ARMBOARD 7.5X6 YLW CONV (MISCELLANEOUS) ×6 IMPLANT
PADDING CAST ABS 4INX4YD NS (CAST SUPPLIES) ×2
PADDING CAST ABS COTTON 4X4 ST (CAST SUPPLIES) ×4 IMPLANT
PADDING CAST COTTON 6X4 STRL (CAST SUPPLIES) ×3 IMPLANT
SET CYSTO W/LG BORE CLAMP LF (SET/KITS/TRAYS/PACK) ×3 IMPLANT
SET HNDPC FAN SPRY TIP SCT (DISPOSABLE) IMPLANT
SPONGE T-LAP 18X18 ~~LOC~~+RFID (SPONGE) ×7 IMPLANT
STOCKINETTE IMPERVIOUS 9X36 MD (GAUZE/BANDAGES/DRESSINGS) ×3 IMPLANT
SUT CHROMIC 4 0 RB 1X27 (SUTURE) ×2 IMPLANT
SUT ETHILON 2 0 FS 18 (SUTURE) ×1 IMPLANT
SUT ETHILON 2 0 PSLX (SUTURE) ×1 IMPLANT
SUT ETHILON 3 0 PS 1 (SUTURE) IMPLANT
SUT VIC AB 2-0 CT1 36 (SUTURE) IMPLANT
SUT VIC AB 2-0 FS1 27 (SUTURE) IMPLANT
SWAB CULTURE ESWAB REG 1ML (MISCELLANEOUS) IMPLANT
SYR CONTROL 10ML LL (SYRINGE) IMPLANT
TOWEL GREEN STERILE (TOWEL DISPOSABLE) ×3 IMPLANT
TOWEL GREEN STERILE FF (TOWEL DISPOSABLE) ×3 IMPLANT
TUBE CONNECTING 12X1/4 (SUCTIONS) ×3 IMPLANT
TUBE FEEDING ENTERAL 5FR 16IN (TUBING) IMPLANT
UNDERPAD 30X36 HEAVY ABSORB (UNDERPADS AND DIAPERS) ×6 IMPLANT
WATER STERILE IRR 1000ML POUR (IV SOLUTION) ×3 IMPLANT
YANKAUER SUCT BULB TIP NO VENT (SUCTIONS) ×3 IMPLANT

## 2021-02-17 NOTE — Brief Op Note (Signed)
02/27/2021 - 03/03/2021  8:39 AM  PATIENT:  Kyle Walton  65 y.o. male  PRE-OPERATIVE DIAGNOSIS:  Right Thigh Abscess  POST-OPERATIVE DIAGNOSIS:  Right Thigh Abscess, chronic draining wound  PROCEDURE:  Procedure(s): IRRIGATION AND DEBRIDEMENT RIGHT THIGH (Right) APPLICATION OF WOUND VAC (Right)  SURGEON:  Surgeon(s) and Role:    * Nicholes Stairs, MD - Primary  PHYSICIAN ASSISTANT: Jonelle Sidle, PA-C  ANESTHESIA:   general  EBL:  10 cc  BLOOD ADMINISTERED:none  DRAINS: none   LOCAL MEDICATIONS USED:  NONE  SPECIMEN:  No Specimen  DISPOSITION OF SPECIMEN:  N/A  COUNTS:  YES  TOURNIQUET:  * Missing tourniquet times found for documented tourniquets in log: [id number] *  DICTATION: .Note written in EPIC  PLAN OF CARE: Admit to inpatient   PATIENT DISPOSITION:  PACU - hemodynamically stable.   Delay start of Pharmacological VTE agent (>24hrs) due to surgical blood loss or risk of bleeding: not applicable

## 2021-02-17 NOTE — Progress Notes (Signed)
[facility] Progress Note   Subjective: Seen in room, eating B'fast post op. No C/Os. Know he will have HD tomorrow on adjusted schedule.   Objective Vitals:   03/07/2021 1005 02/16/2021 1020 02/16/2021 1035 02/19/2021 1054  BP: 104/63 100/64 100/68 (!) 99/54  Pulse: 92 (!) 101 (!) 105 99  Resp: '20 20 20 20  '$ Temp:  97.6 F (36.4 C)  97.8 F (36.6 C)  TempSrc:    Axillary  SpO2: 100% 97% 98% 94%  Weight:      Height:       Physical Exam General: Chronically ill appearing male in NAD Heart: S1,S2 irreg No M/R/G. AFIB on monitor. HR 106.  Lungs: CTAB Anteriorly Abdomen: obese with edema lower abd but improved from admission Extremities: No LE edema.  Toe amps both feet. Has Wound R lateral thigh wound vac in place.  Dialysis [facility]    Additional Objective Labs: Basic Metabolic Panel: Recent Labs  Lab 02/15/21 0134 02/16/21 0242 02/12/2021 0047  NA 132* 134* 134*  K 4.5 3.5 3.4*  CL 98 100 102  CO2 23 25 19*  GLUCOSE 94 147* 110*  BUN 42* 24* 33*  CREATININE 5.38* 3.77* 4.85*  CALCIUM 8.3* 7.8* 7.7*  PHOS  --   --  3.9   Liver Function Tests: Recent Labs  Lab 03/09/2021 0047  ALBUMIN 1.8*   No results for input(s): LIPASE, AMYLASE in the last 168 hours. CBC: Recent Labs  Lab 02/11/21 0116 02/12/21 0124 02/14/21 0128 02/15/21 0134 02/16/21 0242  WBC 13.2* 11.8* 10.5 11.9* 11.9*  HGB 9.0* 8.4* 8.3* 8.0* 8.1*  HCT 29.3* 27.6* 28.2* 27.1* 27.0*  MCV 92.4 92.9 95.3 94.8 94.1  PLT 207 219 198 214 232   Blood Culture    Component Value Date/Time   SDES BLOOD RIGHT ANTECUBITAL 02/12/2021 1110   SPECREQUEST  02/23/2021 1110    BOTTLES DRAWN AEROBIC ONLY Blood Culture adequate volume   CULT  02/16/2021 1110    NO GROWTH 5 DAYS Performed at [facility], [place] [address], [place], [place] [address]    REPTSTATUS 02/12/2021 FINAL 03/09/2021 1110    Cardiac Enzymes: No results for input(s): CKTOTAL, CKMB, CKMBINDEX, TROPONINI in the  last 168 hours. CBG: Recent Labs  Lab 02/16/21 1136 02/16/21 1630 02/16/21 2047 02/08/2021 0919 02/15/2021 1056  GLUCAP 123* 149* 103* 106* 111*   Iron Studies: No results for input(s): IRON, TIBC, TRANSFERRIN, FERRITIN in the last 72 hours. '@lablastinr3'$ @ Studies/Results: No results found. Medications:  sodium chloride     sodium chloride     [START ON 02/18/2021] ceFEPime (MAXIPIME) IV     [START ON 02/18/2021] vancomycin      (feeding supplement) PROSource Plus  30 mL Oral BID BM   acetaminophen  1,000 mg Oral Once   amiodarone  200 mg Oral BID   atorvastatin  40 mg Oral QPM   chlorhexidine  15 mL Mouth Rinse BID   Chlorhexidine Gluconate Cloth  6 each Topical Q0600   cholestyramine light  4 g Oral BID   collagenase   Topical Daily   darbepoetin (ARANESP) injection - DIALYSIS  150 mcg Intravenous Q Tue-HD   docusate sodium  100 mg Oral BID   fentaNYL  1 patch Transdermal Q72H   ferric citrate  210 mg Oral BID WC   Gerhardt's butt cream   Topical TID   HYDROmorphone       insulin aspart  0-5 Units Subcutaneous QHS  insulin aspart  0-9 Units Subcutaneous TID WC   insulin glargine-yfgn  5 Units Subcutaneous QHS   latanoprost  1 drop Both Eyes QHS   levothyroxine  25 mcg Oral QAC breakfast   mouth rinse  15 mL Mouth Rinse q12n4p   metoprolol succinate  12.5 mg Oral Daily   midodrine  10 mg Oral TID WC   multivitamin  1 tablet Oral QHS   mupirocin ointment  1 application Nasal BID   pantoprazole  40 mg Oral Q0600   povidone-iodine  2 application Topical Once   pregabalin  25 mg Oral BID   saccharomyces boulardii  250 mg Oral BID     Dialysis Orders: [facility]: [facility] MWF= changed to TTS in hospital 4 hrs F250 450/800 3.0K/2.5 Ca LIJ TDC  EDW 111 kg -Heparin 4900 units IV initial bolus, 500 units hourly, DC last hour of tx   Assessment/Plan:  R. Thigh Abscess: Continues cepepime and vanc.  To OR this AM for I & D. Now with wound Vac. Per primary.   ESRD - MWF however was  switched to TTS here d/t high MWF census. Will change back to MWF as he approaches DC. Next HD 02/18/2021 due to staffing issues in HD unit.   Hypertension/volume-Have attempted to challenge and lower EDW since admit. Nadir wt 101.5 kg 02/12/21. Using Midodrine with extra 10 mg PO mid run to offset intradialyic hypotension. Continue lowering volume as tolerated.  Anemia  - HGB 8.1 Aranesp 150 mcg IV with HD 02/15/21. Follow trends.  Metabolic bone disease -  C Ca high, VDRA is on hold. PO4 2.0. DC 'd Calcium acetate, decreased auryxia to 1 tab PO BID. Repeat PO4 3.9   Nutrition - Albumin 2.5  Renal/Carb mod diet with protein supps, renal vits. DMT2-per primary Hypothyroidism-per primary AFIB: On amiodarone, apixaban and low-dose metoprolol and was scheduled for cardioversion but this has not happened yet. Seen by Dr. Angelena Form 02/16/2021. Needs uninterrupted anticoagulation for 3 weeks then DCCV.   10. Disposition: Does NOT wish to return to previous SNF. Wanting SNF closer to his home in [place].    Montreal Steidle H. Carmichael Burdette NP-C 02/26/2021, 12:45 PM  [employer] [phone]

## 2021-02-17 NOTE — Transfer of Care (Signed)
Immediate Anesthesia Transfer of Care Note  Patient: Kyle Walton  Procedure(s) Performed: IRRIGATION AND DEBRIDEMENT RIGHT THIGH (Right: Leg Upper) APPLICATION OF WOUND VAC (Right: Leg Upper) LACERATION REPAIR OF TONGUE (Mouth)  Patient Location: PACU  Anesthesia Type:General  Level of Consciousness: oriented, drowsy, patient cooperative and responds to stimulation  Airway & Oxygen Therapy: Patient Spontanous Breathing and Patient connected to nasal cannula oxygen  Post-op Assessment: Report given to RN, Post -op Vital signs reviewed and stable and Patient moving all extremities X 4  Post vital signs: Reviewed and stable  Last Vitals:  Vitals Value Taken Time  BP 83/46 02/20/2021 0921  Temp 36.2 C 02/16/2021 0920  Pulse 82 03/05/2021 0921  Resp 34 03/06/2021 0921  SpO2 99 % 02/14/2021 0921  Vitals shown include unvalidated device data.  Last Pain:  Vitals:   02/18/2021 0420  TempSrc: Axillary  PainSc:       Patients Stated Pain Goal: 0 ([id number])  Complications: No notable events documented.

## 2021-02-17 NOTE — Consult Note (Addendum)
I have seen and examined the patient in HD today.  I have personally reviewed the clinical findings, laboratory findings, microbiological data and imaging studies. The assessment and treatment plan was discussed with the  Advance Practice Provider, Janene Madeira. I agree with her/his recommendations except following additions/corrections.  RECURRENT RT thigh abscess/Pyomyositis in patient with h/o ESRD on HD, DM, CAD s/p CABG, A fib s/p AICD with recent h/o Excisional debridement on 7/14 with cultures growing Serratia marcescens ( received 7 days IV tx and discharged on Ciprofloxacin for 7 more days which apparently patient was not taking)   Afebrile, no leukocytosis on presentation but briefly hypotensive  Blood cultures 8/1 No growth  S/p excisional debridement and wound vac placement on 8/11. No cultures sent.  Patient also had laceration repair of tongue by Dr Constance Holster on 03/03/2021 No bone involvement on CT femur and discussion with Ortho   Plan for 14 days of Cefepime with HD. End date is 03/03/21 Monitor CBC and BMP ID will sign off. Please call with questions.   Kyle Oz, MD [place] [facility] Kyle Walton for Infectious Disease    Date of Admission:  02/28/2021      Total days of antibiotics 10   Cefepime + vancomycin                Reason for Consult: Pyomyositis     Referring Provider: [facility]  Primary Care Provider: Patient, No Pcp Per (Inactive)   Assessment: Kyle Walton is a 65 y.o. male with ESRD admitted   Pyomositis Rt thigh, S/P repeat I&D 8/11 = previously serratia marcescens grew from original operative culture. No new cultures available. D/W Kevan with ortho and no bone involvement - the tract was a soft tissue space that just extended distally from the previous abscess cavity. I don't feel strongly we need MRSA coverage and will continue cefepime alone for now. He reported significant diarrhea with  antibiotics which makes me leery to use quinolone. Possibly we could continue cefepime after HD sessions at discharge. Suspect he will need 2 weeks antibiotics with observation thereafter to ensure he is doing well. Anticipate his large wound will take significant time to heal with co-morbidities.   Diarrhea = antibiotic associated; he reports improvement with probiotics and would like to continue these. No abd symptoms at the time of my assessment   AFib (chronic AC and ICD in place)  T2DM= A1C 7.2%. 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   ESRD on HD MWF via AVF = nephrology following   Discharge = anticipate SNF D/C.    Plan: Stop vancomycin  Continue cefepime for now Follow up after wound vac taken down next week Suspect 2 weeks antibiotic course from surgery given soft tissue only involved - leery to use FQ with diarrhea history reported      Active Problems:   CAD (coronary artery disease)   Diabetes mellitus with peripheral vascular disease ([place])   ESRD on hemodialysis ([place])   Acquired hypothyroidism   Open thigh wound, right, sequela   Wound infection    (feeding supplement) PROSource Plus  30 mL Oral BID BM   acetaminophen  1,000 mg Oral Once   amiodarone  200 mg Oral BID   atorvastatin  40 mg Oral QPM   chlorhexidine  15 mL Mouth Rinse BID   Chlorhexidine Gluconate Cloth  6 each Topical Q0600   [START ON 02/18/2021] Chlorhexidine Gluconate  Cloth  6 each Topical Q0600   cholestyramine light  4 g Oral BID   collagenase   Topical Daily   darbepoetin (ARANESP) injection - DIALYSIS  150 mcg Intravenous Q Tue-HD   docusate sodium  100 mg Oral BID   fentaNYL  1 patch Transdermal Q72H   ferric citrate  210 mg Oral BID WC   Gerhardt's butt cream   Topical TID   HYDROmorphone       insulin aspart  0-5 Units Subcutaneous QHS   insulin aspart  0-9 Units Subcutaneous TID WC   insulin glargine-yfgn  5 Units Subcutaneous QHS   latanoprost  1 drop Both Eyes QHS    levothyroxine  25 mcg Oral QAC breakfast   mouth rinse  15 mL Mouth Rinse q12n4p   metoprolol succinate  12.5 mg Oral Daily   midodrine  10 mg Oral TID WC   multivitamin  1 tablet Oral QHS   mupirocin ointment  1 application Nasal BID   pantoprazole  40 mg Oral Q0600   povidone-iodine  2 application Topical Once   pregabalin  25 mg Oral BID   saccharomyces boulardii  250 mg Oral BID    HPI: Kyle Walton is a 65 y.o. male admitted from [facility] on 02/20/2021 due to worsening right thigh wound.   PMHx detailed below but significant for ESRD on hemodialysis MWF, T2DM (A1C 7.2% 02/24/2021), paroxysmal atrial fibrillation on chronic anticoagulation with Eliquis, chronic respiratory failure on 2LPM oxygen. ICM with ICD in place. He is also legally blind.   Regarding his Rt thigh wound history - he initially presented with problems concerning for cellulitis and pain overlying this thigh site on 12/15/2020 >> ID team saw the patient then with negative BCx, negative imaging for deep/drainable abscess; was treated with IV vanc/Cefepime until discharge. 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 He was followed off antibiotics after discharge. Readmitted 01/17/21 with Rt pyomyositis where he underwent wound incision/drainage by Dr. Stann Mainland on 7/14. Micro with serratia marcescens (R-cefazolin): recommended antibiotic treatment x 14d from surgery with IV cefepime with transition to po cipro once ready for D/C. Discharged on 7/22 to SNF. There is some concern that the patient's antibiotics were not continued in the SNF, unfortunately. Wound nurse saw the patient day of admission and noted increased wound drainage and redness.    ED Course: slightly hypotensive with intermittent lethargy. Normal lactate. Photo of R thigh incision reviewed - purulent material amongst intact sutures with surrounding  erythema. CT scan with concern for possible developing compartment syndrome but overall reduced volume of abscess vs 7/14 scan. +Myositis. Started on vancomycin + cefepime in ER. Orthopedic team recommended admission with IV antibiotics and reassessment after 48h. Enzymatic debridement ordered.   Over the course of the current hospitalization he failed conservative treatment and repeat I&D was advised - he was taken back to OR with Dr. Stann Mainland 8/11: scant purulence found proximal and distal with tracking and undermining at several areas largest 6 cm posterior with tracking 10 cm down to distal femur. No new operative cultures were sent today. Blood cultures from 8/01 at admission were negative, final.   He states his pain has significantly improved following surgery this morning. Wound VAC is in place. Pain under better control with dilaudid. Was having "severe diarrhea" from antibiotics the last time he was on them, this resolved per his account with addition    Review  of Systems: ROS  Past Medical History:  Diagnosis Date   Diabetes mellitus without complication (HCC)    Diastolic heart failure (HCC)    ESRD (end stage renal disease) (HCC)    MI (myocardial infarction) (HCC)    OSA (obstructive sleep apnea)    PAF (paroxysmal atrial fibrillation) ([place])    Renal disorder    on dialysis   Past Surgical History:  Procedure Laterality Date   CORONARY ARTERY BYPASS GRAFT  2017   LIMA LAD, SVG PDA, OM3   DIALYSIS FISTULA CREATION     I & D EXTREMITY Right 01/20/2021   Procedure: IRRIGATION AND DEBRIDEMENT OF THIGH, APPLICATION OF WOUND VAC;  Surgeon: Nicholes Stairs, MD;  Location: [facility];  Service: Orthopedics;  Laterality: Right;   TOE AMPUTATION Bilateral      Social History   Tobacco Use   Smoking status: Never   Smokeless tobacco: Never  Vaping Use   Vaping Use: Never used  Substance Use Topics   Alcohol use: Not Currently   Drug use: Not Currently    History reviewed.  No pertinent family history. Allergies  Allergen Reactions   Morphine Other (See Comments)    Per son, Kyle Walton, pt becomes unresponsive/disoriented- especially when given after HD tx AND "ALLERGIC," per MAR   Liraglutide Diarrhea and Other (See Comments)    "Phenol"...."ALLERGIC," per MAR     OBJECTIVE: Blood pressure (!) 99/54, pulse 99, temperature 97.8 F (36.6 C), temperature source Axillary, resp. rate 20, height '5\' 10"'$  (1.778 m), weight 102.6 kg, SpO2 94 %.  Physical Exam  Lab Results Lab Results  Component Value Date   WBC 11.9 (H) 02/16/2021   HGB 8.1 (L) 02/16/2021   HCT 27.0 (L) 02/16/2021   MCV 94.1 02/16/2021   PLT 232 02/16/2021    Lab Results  Component Value Date   CREATININE 4.85 (H) 02/12/2021   BUN 33 (H) 02/22/2021   NA 134 (L) 02/24/2021   K 3.4 (L) 02/23/2021   CL 102 02/12/2021   CO2 19 (L) 02/12/2021    Lab Results  Component Value Date   ALT 7 02/08/2021   AST 44 (H) 02/08/2021   ALKPHOS 89 02/08/2021   BILITOT 1.3 (H) 02/08/2021     Microbiology: Recent Results (from the past 240 hour(s))  Surgical PCR screen     Status: Abnormal   Collection Time: 02/16/21 11:19 PM   Specimen: Nasal Mucosa; Nasal Swab  Result Value Ref Range Status   MRSA, PCR NEGATIVE NEGATIVE Final   Staphylococcus aureus POSITIVE (A) NEGATIVE Final    Comment: (NOTE) The Xpert SA Assay (FDA approved for NASAL specimens in patients 32 years of age and older), is one component of a comprehensive surveillance program. It is not intended to diagnose infection nor to guide or monitor treatment. Performed at [facility], [place] [address], [place], [place] [address]     Janene Madeira, MSN, NP-C [facility] [place] Cell: [phone] Pager: [phone]  03/06/2021 1:35 PM

## 2021-02-17 NOTE — Progress Notes (Signed)
PT Cancellation Note  Patient Details Name: Juvon Huenefeld MRN: [id number] DOB: [birth year]   Cancelled Treatment:    Reason Eval/Treat Not Completed: Patient at procedure or test/unavailable in OR receiving I&D today. Will check back tomorrow.   Windell Norfolk, DPT, PN2   Supplemental Physical Therapist [place]    Pager [phone] Acute Rehab Office [phone]

## 2021-02-17 NOTE — Progress Notes (Signed)
Pharmacy Antibiotic Note - follow up  Kyle Walton is a 65 y.o. male admitted on 02/12/2021 with  R thigh wound infection s/p I&D 7/14 .  Pharmacy has been consulted for Vanco/Cefepime dosing.  Vanc 8/1>> Cefepime 8/1>>  8/8 PreHD vanc random level = 30 mcg/ml Goal 15-25 mcg/ml.  Patient appears to have tolerated 4 hours at 400 BFR. Will decrease dose with each vancomycin  Bcx 8/1: ngtd  7/14: R thigh: Serratia R to Ancef  Plan: Next HD changed from 8/11 to 8/12 Continue Vanc '750mg'$  Q HD TTS now MWF Continue Cefepime 2g Q HD TTS now MWF F/u with narrowing, LOT, HD schedule and discharge plans   Height: '5\' 10"'$  (177.8 cm) Weight: 102.6 kg (226 lb 3.1 oz) IBW/kg (Calculated) : 73  Temp (24hrs), Avg:97.9 F (36.6 C), Min:97.2 F (36.2 C), Max:98.6 F (37 C)  Recent Labs  Lab 02/11/21 0116 02/12/21 0124 02/14/21 0128 02/15/21 0134 02/16/21 0242 02/21/2021 0047  WBC 13.2* 11.8* 10.5 11.9* 11.9*  --   CREATININE  --  4.54*  --  5.38* 3.77* 4.85*  VANCORANDOM  --   --  30  --   --   --      Estimated Creatinine Clearance: 18.2 mL/min (A) (by C-G formula based on SCr of 4.85 mg/dL (H)).    Allergies  Allergen Reactions   Morphine Other (See Comments)    Per son, [name], pt becomes unresponsive/disoriented- especially when given after HD tx AND "ALLERGIC," per MAR   Liraglutide Diarrhea and Other (See Comments)    "Phenol"...."ALLERGIC," per [facility]      Thank you for allowing pharmacy to be a part of this patient's care.  Ardyth Harps, PharmD Clinical Pharmacist

## 2021-02-17 NOTE — Anesthesia Procedure Notes (Addendum)
Procedure Name: Intubation Date/Time: 02/16/2021 7:35 AM Performed by: Claris Che, CRNA Pre-anesthesia Checklist: Patient identified, Emergency Drugs available, Suction available, Patient being monitored and Timeout performed Patient Re-evaluated:Patient Re-evaluated prior to induction Oxygen Delivery Method: Circle system utilized Preoxygenation: Pre-oxygenation with 100% oxygen Induction Type: IV induction and Cricoid Pressure applied Ventilation: Mask ventilation without difficulty Laryngoscope Size: Mac and 4 Grade View: Grade III Tube type: Oral Tube size: 8.0 mm Number of attempts: 1 Airway Equipment and Method: Stylet Placement Confirmation: ETT inserted through vocal cords under direct vision, positive ETCO2 and breath sounds checked- equal and bilateral Secured at: 24 cm Tube secured with: Tape Dental Injury: Teeth and Oropharynx as per pre-operative assessment and Injury to tongue  Comments: LMA attempt.  Pt had dry mouth and LMA attempt noted tear base of tongue.  Dr Lanetta Inch notified ENT to check before extubation/wake up

## 2021-02-17 NOTE — Progress Notes (Signed)
PROGRESS NOTE    Kyle Walton  [id number] DOB: 08-24-1955 DOA: 02/22/2021 PCP: Patient, No Pcp Per (Inactive)   Brief Narrative: 57 chronically ill with ESRD, anemia of chronic disease, type 2 diabetes, chronic diastolic heart failure, paroxysmal A. fib on Eliquis, COPD chronic hypoxic respiratory failure on 2 L of oxygen, nocturnal BiPAP, hypothyroidism recently discharged from [facility] 7/26: Treated for right thigh abscess, underwent I&D on 7/14.  Completed 7 days of IV therapy inpatient and discharged to SNF on oral ciprofloxacin, which he  may not have started.  Was seen by the wound nurse 8/1, noted to have increasing swelling tenderness and drainage from the wound, subsequently sent to the ED.  In the emergency room was briefly hypotensive, subsequently stabilized, started on broad-spectrum antibiotics, Ortho consulted, initially recommended antibiotics.  Treated with broad-spectrum antibiotics. Underwent incision  and drainage of wound 8/11.   Assessment & Plan:   Active Problems:   CAD (coronary artery disease)   Diabetes mellitus with peripheral vascular disease ([place])   ESRD on hemodialysis ([place])   Acquired hypothyroidism   Open thigh wound, right, sequela     Pressure Injury 12/11/20 Sacrum Right;Left;Mid Stage 2 -  Partial thickness loss of dermis presenting as a shallow open injury with a red, pink wound bed without slough. (Active)  12/11/20 0551  Location: Sacrum  Location Orientation: Right;Left;Mid  Staging: Stage 2 -  Partial thickness loss of dermis presenting as a shallow open injury with a red, pink wound bed without slough.  Wound Description (Comments):   Present on Admission:     1-Right Thigh Abscess:  -Recent I and D on 7/14, culture grew Serratia resistant to Ancef. (Prior admission)  -Admitted with clinical worsening wound, CT concern for developing compartment syndrome, substantially reduced volume of the mixed density collection right  Vastus Lateralis. Locules of subcutaneous gas density track along approximately 13 cm.  -Appreciate orthopedic follow-up, sutures removed to allow drainage. -Underwent I and D of wound and application of negative pressure wound dressing. 8/11. -Continue with IV antibiotics. Will consult ID for length of antibiotics treatment.   2-ESRD on hemodialysis: Dialysis TTS.  Diabetes type 2: Continue with Lantus.  Chronic hypotension: Continue with midodrine  Chronic Diastolic heart failure: Volume managed with hemodialysis.  CAD/CABG: Beta-blocker and statin Hold Plavix for repeat I&D  Paroxysmal A. fib: Continue with Toprol amiodarone Ok to resume Eliquis, discussed with Legrand Como PA with ortho.  Appreciate cardiology/   COPD/chronic respiratory failure: Continue with BiPAP nightly  History of PAD: Prior to amputation and metatarsal amputation.  Continue with statin and Plavix on hold  Anemia of chronic disease: Continue with Epogen with hemodialysis. Check CBC.   Loose stool; started florastore.  Estimated body mass index is 32.46 kg/m as calculated from the following:   Height as of this encounter: '5\' 10"'$  (1.778 m).   Weight as of this encounter: 102.6 kg.   DVT prophylaxis: SCD Code Status: Full Code Family Communication: care discussed with patient.  Disposition Plan:  Status is: Inpatient  Remains inpatient appropriate because:IV treatments appropriate due to intensity of illness or inability to take PO  Dispo: The patient is from: SNF              Anticipated d/c is to: SNF              Patient currently is not medically stable to d/c.   Difficult to place patient No        Consultants:  Ortho  Nephrology Cardiology  Procedures:  I and D   Antimicrobials:    Subjective: He is asking for dilaudid for pain. That is the only medication that helps with pain.  He wants to make sure he is better prior going to Rehab.   Objective: Vitals:   02/21/2021 1005  02/28/2021 1020 02/24/2021 1035 03/06/2021 1054  BP: 104/63 100/64 100/68 (!) 99/54  Pulse: 92 (!) 101 (!) 105 99  Resp: '20 20 20 20  '$ Temp:  97.6 F (36.4 C)  97.8 F (36.6 C)  TempSrc:    Axillary  SpO2: 100% 97% 98% 94%  Weight:      Height:        Intake/Output Summary (Last 24 hours) at 02/23/2021 1309 Last data filed at 02/16/2021 [id number] Gross per 24 hour  Intake 800 ml  Output 2 ml  Net 798 ml    Filed Weights   02/15/21 1350 02/16/21 0450 02/24/2021 0420  Weight: 102.1 kg 102.3 kg 102.6 kg    Examination:  General exam: NAD Respiratory system: CTA Cardiovascular system: S 1, S 2 IRR Gastrointestinal system: BS present, soft, nt Central nervous system: Alert, oriented  Extremities: Right thigh with wound vac    Data Reviewed: I have personally reviewed following labs and imaging studies  CBC: Recent Labs  Lab 02/11/21 0116 02/12/21 0124 02/14/21 0128 02/15/21 0134 02/16/21 0242  WBC 13.2* 11.8* 10.5 11.9* 11.9*  HGB 9.0* 8.4* 8.3* 8.0* 8.1*  HCT 29.3* 27.6* 28.2* 27.1* 27.0*  MCV 92.4 92.9 95.3 94.8 94.1  PLT 207 219 198 214 [id number]    Basic Metabolic Panel: Recent Labs  Lab 02/12/21 0124 02/15/21 0134 02/16/21 0242 02/11/2021 0047  NA 130* 132* 134* 134*  K 4.1 4.5 3.5 3.4*  CL 97* 98 100 102  CO2 '23 23 25 '$ 19*  GLUCOSE 144* 94 147* 110*  BUN 30* 42* 24* 33*  CREATININE 4.54* 5.38* 3.77* 4.85*  CALCIUM 8.3* 8.3* 7.8* 7.7*  PHOS  --   --   --  3.9    GFR: Estimated Creatinine Clearance: 18.2 mL/min (A) (by C-G formula based on SCr of 4.85 mg/dL (H)). Liver Function Tests: Recent Labs  Lab 02/23/2021 0047  ALBUMIN 1.8*   No results for input(s): LIPASE, AMYLASE in the last 168 hours. No results for input(s): AMMONIA in the last 168 hours. Coagulation Profile: No results for input(s): INR, PROTIME in the last 168 hours. Cardiac Enzymes: No results for input(s): CKTOTAL, CKMB, CKMBINDEX, TROPONINI in the last 168 hours. BNP (last 3 results) No  results for input(s): PROBNP in the last 8760 hours. HbA1C: No results for input(s): HGBA1C in the last 72 hours. CBG: Recent Labs  Lab 02/16/21 1136 02/16/21 1630 02/16/21 2047 02/28/2021 0919 02/26/2021 1056  GLUCAP 123* 149* 103* 106* 111*    Lipid Profile: No results for input(s): CHOL, HDL, LDLCALC, TRIG, CHOLHDL, LDLDIRECT in the last 72 hours. Thyroid Function Tests: No results for input(s): TSH, T4TOTAL, FREET4, T3FREE, THYROIDAB in the last 72 hours. Anemia Panel: No results for input(s): VITAMINB12, FOLATE, FERRITIN, TIBC, IRON, RETICCTPCT in the last 72 hours. Sepsis Labs: No results for input(s): PROCALCITON, LATICACIDVEN in the last 168 hours.  Recent Results (from the past 240 hour(s))  Surgical PCR screen     Status: Abnormal   Collection Time: 02/16/21 11:19 PM   Specimen: Nasal Mucosa; Nasal Swab  Result Value Ref Range Status   MRSA, PCR NEGATIVE NEGATIVE Final   Staphylococcus aureus POSITIVE (A) NEGATIVE Final  Comment: (NOTE) The Xpert SA Assay (FDA approved for NASAL specimens in patients 28 years of age and older), is one component of a comprehensive surveillance program. It is not intended to diagnose infection nor to guide or monitor treatment. Performed at [facility], [place] [address] [place], [place] [address]           Radiology Studies: No results found.      Scheduled Meds:  (feeding supplement) PROSource Plus  30 mL Oral BID BM   acetaminophen  1,000 mg Oral Once   amiodarone  200 mg Oral BID   atorvastatin  40 mg Oral QPM   chlorhexidine  15 mL Mouth Rinse BID   Chlorhexidine Gluconate Cloth  6 each Topical Q0600   [START ON 02/18/2021] Chlorhexidine Gluconate Cloth  6 each Topical Q0600   cholestyramine light  4 g Oral BID   collagenase   Topical Daily   darbepoetin (ARANESP) injection - DIALYSIS  150 mcg Intravenous Q Tue-HD   docusate sodium  100 mg Oral BID   fentaNYL  1 patch Transdermal Q72H   ferric citrate   210 mg Oral BID WC   Gerhardt's butt cream   Topical TID   HYDROmorphone       insulin aspart  0-5 Units Subcutaneous QHS   insulin aspart  0-9 Units Subcutaneous TID WC   insulin glargine-yfgn  5 Units Subcutaneous QHS   latanoprost  1 drop Both Eyes QHS   levothyroxine  25 mcg Oral QAC breakfast   mouth rinse  15 mL Mouth Rinse q12n4p   metoprolol succinate  12.5 mg Oral Daily   midodrine  10 mg Oral TID WC   multivitamin  1 tablet Oral QHS   mupirocin ointment  1 application Nasal BID   pantoprazole  40 mg Oral Q0600   povidone-iodine  2 application Topical Once   pregabalin  25 mg Oral BID   saccharomyces boulardii  250 mg Oral BID   Continuous Infusions:  sodium chloride     sodium chloride     [START ON 02/18/2021] ceFEPime (MAXIPIME) IV     [START ON 02/18/2021] vancomycin       LOS: 10 days    Time spent: 35  minutes.     Elmarie Shiley, MD [facility]   If 7PM-7AM, please contact night-coverage [email or web address]  02/13/2021, 1:09 PM

## 2021-02-17 NOTE — Progress Notes (Signed)
ANTICOAGULATION CONSULT NOTE - Initial Consult  Pharmacy Consult for Apixaban Indication: atrial fibrillation  Allergies  Allergen Reactions   Morphine Other (See Comments)    Per son, [name], pt becomes unresponsive/disoriented- especially when given after HD tx AND "ALLERGIC," per MAR   Liraglutide Diarrhea and Other (See Comments)    "Phenol"...."ALLERGIC," per [facility]     Patient Measurements: Height: '5\' 10"'$  (177.8 cm) Weight: 102.6 kg (226 lb 3.1 oz) IBW/kg (Calculated) : 73  Vital Signs: Temp: 97.8 F (36.6 C) (08/11 1054) Temp Source: Axillary (08/11 1054) BP: 99/54 (08/11 1054) Pulse Rate: 99 (08/11 1054)  Labs: Recent Labs    02/15/21 0134 02/16/21 0242 02/26/2021 0047  HGB 8.0* 8.1*  --   HCT 27.1* 27.0*  --   PLT 214 232  --   CREATININE 5.38* 3.77* 4.85*    Estimated Creatinine Clearance: 18.2 mL/min (A) (by C-G formula based on SCr of 4.85 mg/dL (H)).   Medical History: Past Medical History:  Diagnosis Date   Diabetes mellitus without complication (HCC)    Diastolic heart failure (HCC)    ESRD (end stage renal disease) (HCC)    MI (myocardial infarction) ([place])    OSA (obstructive sleep apnea)    PAF (paroxysmal atrial fibrillation) ([place])    Renal disorder    on dialysis    Medications:  Medications Prior to Admission  Medication Sig Dispense Refill Last Dose   Amino Acids-Protein Hydrolys (PRO-STAT) LIQD Take 30 mLs by mouth in the morning and at bedtime.   02/06/2021 at 2100   amiodarone (PACERONE) 200 MG tablet Take 1 tablet (200 mg total) by mouth 2 (two) times daily.   02/06/2021 at 2100   apixaban (ELIQUIS) 5 MG TABS tablet Take 1 tablet (5 mg total) by mouth 2 (two) times daily. 60 tablet 0 02/06/2021 at 2100   atorvastatin (LIPITOR) 40 MG tablet Take 40 mg by mouth every evening. (2100)   02/06/2021 at 2100   B Complex-C-Folic Acid (RENAL-VITE) 0.8 MG TABS Take 1 tablet by mouth in the morning.   02/06/2021 at 0900   calcium acetate (PHOSLO) 667 MG  capsule Take 1,334 mg by mouth 3 (three) times daily with meals.   02/06/2021 at 1700   cholestyramine light (PREVALITE) 4 g packet Take 1 packet (4 g total) by mouth every 12 (twelve) hours. 60 packet 0 02/06/2021 at 2100   clonazePAM (KLONOPIN) 0.5 MG tablet Take 0.5 tablets (0.25 mg total) by mouth 2 (two) times daily as needed for anxiety. (Patient taking differently: Take 0.25 mg by mouth every 12 (twelve) hours as needed for anxiety.) 6 tablet 0 unk   clopidogrel (PLAVIX) 75 MG tablet Take 75 mg by mouth in the morning.   02/06/2021 at 900   digoxin (LANOXIN) 0.125 MG tablet Take 0.0625 mg by mouth every other day.   02/06/2021 at 0900   docusate sodium (COLACE) 100 MG capsule Take 1 capsule (100 mg total) by mouth 2 (two) times daily as needed for mild constipation. (Patient taking differently: Take 100 mg by mouth every 12 (twelve) hours as needed for mild constipation.) 10 capsule 0 unk   fentaNYL (DURAGESIC) 25 MCG/HR Place 1 patch onto the skin every 3 (three) days.   02/04/2021 at 0900   ferric citrate (AURYXIA) 1 GM 210 MG(Fe) tablet Take 210 mg by mouth 3 (three) times daily with meals.   02/06/2021 at 1700   HYDROmorphone (DILAUDID) 2 MG tablet Take 2 mg by mouth every 8 (eight)  hours as needed for severe pain.   03/02/2021 at am   Insulin Glargine (BASAGLAR KWIKPEN) 100 UNIT/ML Inject 15 Units into the skin at bedtime. 10 mL 0 02/06/2021 at 2100   insulin lispro (HUMALOG) 100 UNIT/ML KwikPen Inject 2-12 Units into the skin See admin instructions. Inject 2-12 units into the skin three times a day before meals and at bedtime, per sliding scale: BGL 180-200 = 2 units; 201-250 = 5 units; 251-300 = 7 units; 301-400 = 12 units   02/06/2021   latanoprost (XALATAN) 0.005 % ophthalmic solution Place 1 drop into both eyes at bedtime.   02/06/2021 at 2100   levothyroxine (SYNTHROID) 25 MCG tablet Take 25 mcg by mouth daily before breakfast.   02/15/2021 at 0600   metoprolol succinate (TOPROL-XL) 25 MG 24 hr  tablet Take 0.5 tablets (12.5 mg total) by mouth daily. (Patient taking differently: Take 25 mg by mouth daily.)   02/06/2021 at 0900   midodrine (PROAMATINE) 10 MG tablet Take 1 tablet (10 mg total) by mouth 3 (three) times daily with meals. (0900, 1400 & 2100) (Patient taking differently: Take 10 mg by mouth 3 (three) times daily.) 90 tablet 0 02/06/2021 at 2100   mupirocin ointment (BACTROBAN) 2 % Apply 1 application topically See admin instructions. Apply to scalp and arms every morning and at bedtime- for wound care   02/06/2021 at 2100   nitroGLYCERIN (NITROSTAT) 0.4 MG SL tablet Place 0.4 mg under the tongue every 5 (five) minutes x 3 doses as needed for chest pain.   unk   OXYGEN Inhale 2 L/min into the lungs continuous.   Continuous at Continuous   pantoprazole (PROTONIX) 40 MG tablet Take 40 mg by mouth daily at 6 (six) AM.   02/13/2021 at 0600   polyethylene glycol (MIRALAX / GLYCOLAX) 17 g packet Take 17 g by mouth daily as needed for moderate constipation. 14 each 0 unk   pregabalin (LYRICA) 25 MG capsule Take 25 mg by mouth 2 (two) times daily.   02/06/2021 at 2100   sevelamer carbonate (RENVELA) 800 MG tablet Take 2 tablets (1,600 mg total) by mouth 3 (three) times daily with meals.   02/06/2021 at 1700   digoxin 62.5 MCG TABS Take 0.0625 mg by mouth every other day. (Patient not taking: Reported on 02/12/2021)   Not Taking   lidocaine (XYLOCAINE) 5 % ointment Apply topically 3 (three) times daily. (Patient not taking: Reported on 02/13/2021) 35.44 g 0 Not Taking   Oxycodone HCl 10 MG TABS Take 1 tablet (10 mg total) by mouth 2 (two) times daily as needed (severe pain). (Patient not taking: Reported on 02/11/2021) 10 tablet 0 Not Taking   Scheduled:   (feeding supplement) PROSource Plus  30 mL Oral BID BM   amiodarone  200 mg Oral BID   atorvastatin  40 mg Oral QPM   chlorhexidine  15 mL Mouth Rinse BID   Chlorhexidine Gluconate Cloth  6 each Topical Q0600   [START ON 02/18/2021] Chlorhexidine  Gluconate Cloth  6 each Topical Q0600   cholestyramine light  4 g Oral BID   collagenase   Topical Daily   darbepoetin (ARANESP) injection - DIALYSIS  150 mcg Intravenous Q Tue-HD   docusate sodium  100 mg Oral BID   fentaNYL  1 patch Transdermal Q72H   ferric citrate  210 mg Oral BID WC   Gerhardt's butt cream   Topical TID   HYDROmorphone       insulin aspart  0-5 Units Subcutaneous QHS   insulin aspart  0-9 Units Subcutaneous TID WC   insulin glargine-yfgn  5 Units Subcutaneous QHS   latanoprost  1 drop Both Eyes QHS   levothyroxine  25 mcg Oral QAC breakfast   mouth rinse  15 mL Mouth Rinse q12n4p   metoprolol succinate  12.5 mg Oral Daily   midodrine  10 mg Oral TID WC   multivitamin  1 tablet Oral QHS   mupirocin ointment  1 application Nasal BID   pantoprazole  40 mg Oral Q0600   povidone-iodine  2 application Topical Once   pregabalin  25 mg Oral BID   saccharomyces boulardii  250 mg Oral BID    Assessment: Patient previously on Apixaban '5mg'$  twice daily for AFib. Apixban was held on admission due to anticipated OR/IR for I&D. Now s/p I&D. OK to resume PTA apixaban for Afib.   Goal of Therapy:  Monitor platelets by anticoagulation protocol: Yes   Plan:  Restart PTA apixaban '5mg'$  Q12H.    Thank you for allowing pharmacy to be a part of this patient's care.  Ardyth Harps, PharmD Clinical Pharmacist

## 2021-02-17 NOTE — H&P (Signed)
H&P update  The surgical history has been reviewed and remains accurate without interval change.  The patient was re-examined and patient's physiologic condition has not changed significantly in the last 30 days.  He has had persistent drainage from his right lateral thigh wound since readmission to the hospital.  There are some concern for nonhealing wound there based on failure to really progress with bedside care.  We are here today to perform a formal irrigation debridement with possible VAC placement.  We will obviously need strict compliance with post course of antibiotics which was not the case when he went to the skilled nursing facility.  The condition still exists that makes this procedure necessary. The treatment plan remains the same, without new options for care.  No new pharmacological allergies or types of therapy has been initiated that would change the plan or the appropriateness of the plan.  The patient and/or family understand the potential benefits and risks.  Maythe Deramo P. Stann Mainland, MD 03/07/2021 7:08 AM

## 2021-02-17 NOTE — Op Note (Signed)
Date of Surgery: 02/24/2021  INDICATIONS: Mr. Fazzino is a 65 y.o.-year-old male with a right thigh abscess with chronic draining wound; he is an unfortunate gentleman with poorly controlled diabetes that presented to our emergency department a little over a month ago with significant right thigh myofascitis.  This was drained by me.  He was discharged to a SNF after a short period of wound VAC following the I&D.  He was doing pretty well and then did not receive any antibiotics at the SNF.  He returned back to our hospital with some drainage from the incision sites.  He is indicated for repeat excisional irrigation and debridement of his right thigh.  The Patient did consent to the procedure after discussion of the risks and benefits.  PREOPERATIVE DIAGNOSIS: 1. Right thigh pyomyositis 2. Right thigh draining wound  POSTOPERATIVE DIAGNOSIS: Same.  PROCEDURE:  Excisional irrigation debridement of right thigh chronic and draining wound. 2. Application of negative pressure wound dressing 10 cm x 1 cm  SURGEON: Geralynn Rile, M.D.  ASSIST: Jonelle Sidle, PA-C  Attestation:  PA Mcclung present for the entire procedure.  ANESTHESIA:  general  IV FLUIDS AND URINE: See anesthesia.  ESTIMATED BLOOD LOSS: 10 mL.  IMPLANTS: None  DRAINS: Incisional wound VAC  COMPLICATIONS: None.  DESCRIPTION OF PROCEDURE: The patient was brought to the operating room and placed supine on the operating table.  The patient had been signed prior to the procedure and this was documented. The patient had the anesthesia placed by the anesthesiologist.  A time-out was performed to confirm that this was the correct patient, site, side and location. The patient did receive antibiotics prior to the incision and was re-dosed during the procedure as needed at indicated intervals.  A tourniquet was not placed.  The patient had the operative extremity prepped and draped in the standard surgical fashion.     Debridement  type: Excisional Debridement  Side: right  Body Location: Thigh  Tools used for debridement: scalpel, scissors, curette, and rongeur  Pre-debridement Wound size (cm):   Length: 8 cm        Width: 1.5 cm     depth: 2 cm  Post-debridement Wound size (cm):   Length: 10 cm        width: 3 cm     depth: 2 cm  Debridement depth beyond dead/damaged tissue down to healthy viable tissue: yes  Tissue layer involved: skin, subcutaneous tissue, muscle / fascia  Nature of tissue removed: Slough, Necrotic, and Purulence  Irrigation volume: 6 L     Irrigation fluid type: Normal Saline  We did encounter scant purulence proximal and distal.  It was also noted that there was some tracking and undermining both proximal, posterior, and distal.  Proximally it undermined for approximately 6 cm, posterior for the entire length of the incision 4 cm of posterior tracking, and distal tract approximately 10 cm down towards the distal femur.  Following completion of the excisional irrigation debridement we applied a an incisional VAC.  We did close the wound with interrupted 2-0 nylon's with traction sutures in alternating horizontal mattress.  We then cleaned and dried the leg.  Applied the dressing sponge.  Cover that sponge with an occlusive dressing.  We cut a small hole in the center and applied the track pad for the vacuum device.  We then attached this to the negative pressure wound vacuum assisted closure machine which had excellent seal.  He was awakened from anesthetic with no noted  complication.  All counts were correct x2.  POSTOPERATIVE PLAN:   Ms. Muratalla will return to the medicine service.  He will receive IV antibiotics per his previous intraoperative cultures.  He can be weightbearing as tolerated to the right lower extremity.  He will maintain postoperative incisional VAC for approximately 5 days.  We will remove this on the floor.

## 2021-02-17 NOTE — Op Note (Signed)
OPERATIVE REPORT  DATE OF SURGERY: 02/14/2021  PATIENT:  Kyle Walton,  65 y.o. male  PRE-OPERATIVE DIAGNOSIS: Oral laceration [id number]  POST-OPERATIVE DIAGNOSIS: Oral laceration  PROCEDURE:  Procedure(s):  LACERATION REPAIR OF TONGUE  SURGEON:  Beckie Salts, MD  ASSISTANTS: None  ANESTHESIA:   General   EBL: Less than 2 ml  DRAINS: None  LOCAL MEDICATIONS USED:  None  SPECIMEN:  none  COUNTS:  Correct  PROCEDURE DETAILS: The patient was previously intubated for an orthopedic procedure.  During extubation will inform inadvertent Primary service at this time.  I was consulted intraoperatively to evaluate and treat for this.  On examination under general anesthesia with an oral endotracheal tube in place.  There is a transverse laceration approximately 3 cm in length involving the ventral surface of the tongue just posterior to the submandibular puncta.  The laceration is superficial.  There is exposed muscle fibers but the laceration does not extend any deeper.  There is no bleeding.  The laceration areas centered left of midline.  The oral cavity was irrigated with saline and the laceration was reapproximated with a running 4-0 chromic suture.  There were no complications.  Care was then returned to the anesthesia team.    PATIENT DISPOSITION:  To PACU, stable

## 2021-02-18 ENCOUNTER — Encounter (HOSPITAL_COMMUNITY): Payer: Self-pay | Admitting: Orthopedic Surgery

## 2021-02-18 DIAGNOSIS — T148XXA Other injury of unspecified body region, initial encounter: Secondary | ICD-10-CM | POA: Diagnosis not present

## 2021-02-18 DIAGNOSIS — S71101S Unspecified open wound, right thigh, sequela: Secondary | ICD-10-CM | POA: Diagnosis not present

## 2021-02-18 LAB — CBC
HCT: 25.9 % — ABNORMAL LOW (ref 39.0–52.0)
HCT: 27.9 % — ABNORMAL LOW (ref 39.0–52.0)
Hemoglobin: 7.4 g/dL — ABNORMAL LOW (ref 13.0–17.0)
Hemoglobin: 8.4 g/dL — ABNORMAL LOW (ref 13.0–17.0)
MCH: 27.2 pg (ref 26.0–34.0)
MCH: 28.3 pg (ref 26.0–34.0)
MCHC: 28.6 g/dL — ABNORMAL LOW (ref 30.0–36.0)
MCHC: 30.1 g/dL (ref 30.0–36.0)
MCV: 95.2 fL (ref 80.0–100.0)
MCV: 93.9 fL (ref 80.0–100.0)
Platelets: 233 10*3/uL (ref 150–400)
Platelets: 229 10*3/uL (ref 150–400)
RBC: 2.72 MIL/uL — ABNORMAL LOW (ref 4.22–5.81)
RBC: 2.97 MIL/uL — ABNORMAL LOW (ref 4.22–5.81)
RDW: 20.3 % — ABNORMAL HIGH (ref 11.5–15.5)
RDW: 20.2 % — ABNORMAL HIGH (ref 11.5–15.5)
WBC: 12.8 10*3/uL — ABNORMAL HIGH (ref 4.0–10.5)
WBC: 14.5 10*3/uL — ABNORMAL HIGH (ref 4.0–10.5)
nRBC: 0 % (ref 0.0–0.2)
nRBC: 0 % (ref 0.0–0.2)

## 2021-02-18 LAB — GLUCOSE, CAPILLARY
Glucose-Capillary: 164 mg/dL — ABNORMAL HIGH (ref 70–99)
Glucose-Capillary: 154 mg/dL — ABNORMAL HIGH (ref 70–99)
Glucose-Capillary: 145 mg/dL — ABNORMAL HIGH (ref 70–99)
Glucose-Capillary: 134 mg/dL — ABNORMAL HIGH (ref 70–99)

## 2021-02-18 LAB — RENAL FUNCTION PANEL
Albumin: 1.8 g/dL — ABNORMAL LOW (ref 3.5–5.0)
Albumin: 2 g/dL — ABNORMAL LOW (ref 3.5–5.0)
Anion gap: 12 (ref 5–15)
Anion gap: 11 (ref 5–15)
BUN: 46 mg/dL — ABNORMAL HIGH (ref 8–23)
BUN: 19 mg/dL (ref 8–23)
CO2: 22 mmol/L (ref 22–32)
CO2: 26 mmol/L (ref 22–32)
Calcium: 7.9 mg/dL — ABNORMAL LOW (ref 8.9–10.3)
Calcium: 7.8 mg/dL — ABNORMAL LOW (ref 8.9–10.3)
Chloride: 101 mmol/L (ref 98–111)
Chloride: 97 mmol/L — ABNORMAL LOW (ref 98–111)
Creatinine, Ser: 6.31 mg/dL — ABNORMAL HIGH (ref 0.61–1.24)
Creatinine, Ser: 3.33 mg/dL — ABNORMAL HIGH (ref 0.61–1.24)
GFR, Estimated: 9 mL/min — ABNORMAL LOW (ref >60)
GFR, Estimated: 20 mL/min — ABNORMAL LOW (ref >60)
Glucose, Bld: 156 mg/dL — ABNORMAL HIGH (ref 70–99)
Glucose, Bld: 109 mg/dL — ABNORMAL HIGH (ref 70–99)
Phosphorus: 6.3 mg/dL — ABNORMAL HIGH (ref 2.5–4.6)
Phosphorus: 3.1 mg/dL (ref 2.5–4.6)
Potassium: 3.7 mmol/L (ref 3.5–5.1)
Potassium: 3.3 mmol/L — ABNORMAL LOW (ref 3.5–5.1)
Sodium: 135 mmol/L (ref 135–145)
Sodium: 134 mmol/L — ABNORMAL LOW (ref 135–145)

## 2021-02-18 LAB — HEPATITIS B SURFACE ANTIGEN: Hepatitis B Surface Ag: NONREACTIVE

## 2021-02-18 MED ORDER — SODIUM CHLORIDE 0.9 % IV SOLN: 100.0000 mL | INTRAVENOUS | Status: DC | PRN

## 2021-02-18 MED ORDER — SODIUM CHLORIDE 0.9 % IV SOLN
2.0000 g | INTRAVENOUS | Status: DC
  Administered 2021-02-21: 2 g via INTRAVENOUS
  Filled 2021-02-18: qty 2

## 2021-02-18 MED ORDER — DARBEPOETIN ALFA 200 MCG/0.4ML IJ SOSY: 200.0000 ug | PREFILLED_SYRINGE | INTRAMUSCULAR | Status: DC

## 2021-02-18 MED ORDER — FERRIC CITRATE 1 GM 210 MG(FE) PO TABS
420.0000 mg | ORAL_TABLET | Freq: Two times a day (BID) | ORAL | Status: DC
  Administered 2021-02-18 – 2021-03-01 (×21): 420 mg via ORAL
  Filled 2021-02-18 (×21): qty 2

## 2021-02-18 MED ORDER — DARBEPOETIN ALFA 200 MCG/0.4ML IJ SOSY
200.0000 ug | PREFILLED_SYRINGE | INTRAMUSCULAR | Status: DC
  Filled 2021-02-18: qty 0.4

## 2021-02-18 MED ORDER — PENTAFLUOROPROP-TETRAFLUOROETH EX AERO: 1.0000 "application " | INHALATION_SPRAY | CUTANEOUS | Status: DC | PRN

## 2021-02-18 MED ORDER — LIDOCAINE HCL (PF) 1 % IJ SOLN
5.0000 mL | INTRAMUSCULAR | Status: DC | PRN
  Filled 2021-02-18: qty 5

## 2021-02-18 NOTE — Progress Notes (Signed)
PT Cancellation Note  Patient Details Name: Kyle Walton MRN: [id number] DOB: Apr 27, 1956   Cancelled Treatment:    Reason Eval/Treat Not Completed: Medical issues which prohibited therapy;Fatigue/lethargy limiting ability to participate.  Per OT and nursing, pt is having high HR's and limited ability to work with fatigue.  Will re-attempt when pt is more capable of doing therapy.   Ramond Dial 02/18/2021, 3:26 PM  Mee Hives, PT MS Acute Rehab Dept. Number: [place]

## 2021-02-18 NOTE — Progress Notes (Signed)
[facility] Progress Note   Subjective:    Seen and examined patient on HD. Informed by HD RN of sys Bps in 80s. Pt on Midodrine. UF placed on hold and 250cc NS bolus given X 2. Patient asymptomatic-denies dizziness, SOB, and CP. Continue to monitor Bps. Plan for next HD on 02/21/21.   Objective Vitals:   02/18/21 0930 02/18/21 1000 02/18/21 1030 02/18/21 1100  BP: 109/90 (!) 95/55 94/61 (!) 84/41  Pulse: (!) 113 (!) 110 (!) 116 (!) 115  Resp: '15 16 14 20  '$ Temp:      TempSrc:      SpO2:      Weight:      Height:       Physical Exam General: Chronically ill-appearing male; NAD Heart: Normal S1 and S2; No murmurs, rubs, and gallops Lungs: Cleart throughout; No wheezing, rales, or rhonchi Abdomen: No ABD edema, soft, non-tender, (+) BS Extremities: No edema BLLE Dialysis Access: L IJ [facility]   Filed Weights   02/16/21 0450 02/13/2021 0420 02/18/21 0735  Weight: 102.3 kg 102.6 kg 102.6 kg    Intake/Output Summary (Last 24 hours) at 02/18/2021 1120 Last data filed at 02/19/2021 2200 Gross per 24 hour  Intake 240 ml  Output --  Net 240 ml    Additional Objective Labs: Basic Metabolic Panel: Recent Labs  Lab 02/16/21 0242 02/08/2021 0047 02/18/21 0833  NA 134* 134* 135  K 3.5 3.4* 3.7  CL 100 102 101  CO2 25 19* 22  GLUCOSE 147* 110* 156*  BUN 24* 33* 46*  CREATININE 3.77* 4.85* 6.31*  CALCIUM 7.8* 7.7* 7.9*  PHOS  --  3.9 6.3*   Liver Function Tests: Recent Labs  Lab 03/02/2021 0047 02/18/21 0833  ALBUMIN 1.8* 1.8*   No results for input(s): LIPASE, AMYLASE in the last 168 hours. CBC: Recent Labs  Lab 02/14/21 0128 02/15/21 0134 02/16/21 0242 02/15/2021 1439 02/18/21 0834  WBC 10.5 11.9* 11.9* 12.0* 12.8*  HGB 8.3* 8.0* 8.1* 8.1* 7.4*  HCT 28.2* 27.1* 27.0* 28.0* 25.9*  MCV 95.3 94.8 94.1 95.6 95.2  PLT 198 214 232 263 233   Blood Culture    Component Value Date/Time   SDES BLOOD RIGHT ANTECUBITAL 02/24/2021 1110   SPECREQUEST  03/06/2021  1110    BOTTLES DRAWN AEROBIC ONLY Blood Culture adequate volume   CULT  03/04/2021 1110    NO GROWTH 5 DAYS Performed at [facility], [place] [address] [place], [place] [address]    REPTSTATUS 02/12/2021 FINAL 02/12/2021 1110    Cardiac Enzymes: No results for input(s): CKTOTAL, CKMB, CKMBINDEX, TROPONINI in the last 168 hours. CBG: Recent Labs  Lab 03/04/2021 0919 02/14/2021 1056 02/13/2021 1612 02/21/2021 2008 02/18/21 0629  GLUCAP 106* 111* 132* 178* 164*   Iron Studies: No results for input(s): IRON, TIBC, TRANSFERRIN, FERRITIN in the last 72 hours. Lab Results  Component Value Date   INR 1.9 (H) 02/08/2021   INR 2.6 (H) 01/18/2021   INR 1.5 (H) 12/04/2020   Studies/Results: No results found.  Medications:  sodium chloride     sodium chloride     sodium chloride     sodium chloride     ceFEPime (MAXIPIME) IV 2 g (02/18/21 1039)    (feeding supplement) PROSource Plus  30 mL Oral BID BM   amiodarone  200 mg Oral BID   apixaban  5 mg Oral BID   atorvastatin  40 mg Oral QPM   chlorhexidine  15 mL  Mouth Rinse BID   Chlorhexidine Gluconate Cloth  6 each Topical Q0600   Chlorhexidine Gluconate Cloth  6 each Topical Q0600   cholestyramine light  4 g Oral BID   collagenase   Topical Daily   darbepoetin (ARANESP) injection - DIALYSIS  150 mcg Intravenous Q Tue-HD   docusate sodium  100 mg Oral BID   fentaNYL  1 patch Transdermal Q72H   ferric citrate  210 mg Oral BID WC   Gerhardt's butt cream   Topical TID   insulin aspart  0-5 Units Subcutaneous QHS   insulin aspart  0-9 Units Subcutaneous TID WC   insulin glargine-yfgn  5 Units Subcutaneous QHS   latanoprost  1 drop Both Eyes QHS   levothyroxine  25 mcg Oral QAC breakfast   mouth rinse  15 mL Mouth Rinse q12n4p   metoprolol succinate  12.5 mg Oral Daily   midodrine  10 mg Oral TID WC   multivitamin  1 tablet Oral QHS   mupirocin ointment  1 application Nasal BID   pantoprazole  40 mg Oral Q0600    povidone-iodine  2 application Topical Once   pregabalin  25 mg Oral BID   saccharomyces boulardii  250 mg Oral BID    Dialysis Orders: [facility]: MWF 4 hrs F250 450/800 3.0K/2.5 Ca LIJ TDC  EDW 111 kg -Heparin 4900 units IV initial bolus, 500 units hourly, DC last hour of tx  Assessment/Plan:  R. Thigh Abscess: Continue cepepime, per ID: d/c vanc. To OR this AM for I & D. Now with wound Vac. Per primary.   ESRD - On HD MWF. On HD today-noted soft Bps (sys 80s)-UF on hold and 250cc NS boluses given. Pt on Midodrine-given order to give extra Midodrine dose mid-treatment if indicated. Next HD 02/21/2021.  Hypertension/volume-Have attempted to challenge and lower EDW since admit. Nadir wt 101.5 kg 02/12/21. Using Midodrine with extra 10 mg PO mid run to offset intradialyic hypotension. Continue lowering volume as tolerated. Anemia  - HGB now 7.4. Aranesp 150 mcg IV with HD 02/15/21-will raise for next dose. Blood transfusion if needed. Follow trends. Metabolic bone disease -  C Ca 9.96, VDRA is on hold. PO4 trending up-now 6.3. DC 'd Calcium acetate d/t intolerance- will raise auryxia to 2 tab PO BID. Nutrition - Albumin 2.5  Renal/Carb mod diet with protein supps, renal vits. DMT2-per primary Hypothyroidism-per primary AFIB: On amiodarone, apixaban and low-dose metoprolol and was scheduled for cardioversion but this has not happened yet. Seen by Dr. Angelena Form 02/16/2021. Needs uninterrupted anticoagulation for 3 weeks then DCCV.   9. Disposition: Does NOT wish to return to  previous SNF. Wanting SNF closer to his home in [place].    Tobie Poet, NP [facility] 02/18/2021,11:20 AM  LOS: 11 days

## 2021-02-18 NOTE — Plan of Care (Signed)

## 2021-02-18 NOTE — Anesthesia Postprocedure Evaluation (Signed)
Anesthesia Post Note  Patient: Kyle Walton  Procedure(s) Performed: IRRIGATION AND DEBRIDEMENT RIGHT THIGH (Right: Leg Upper) APPLICATION OF WOUND VAC (Right: Leg Upper) LACERATION REPAIR OF TONGUE (Mouth)     Patient location during evaluation: PACU Anesthesia Type: General Level of consciousness: awake and alert Pain management: pain level controlled Vital Signs Assessment: post-procedure vital signs reviewed and stable Respiratory status: spontaneous breathing, nonlabored ventilation, respiratory function stable and patient connected to nasal cannula oxygen Cardiovascular status: blood pressure returned to baseline and stable Postop Assessment: no apparent nausea or vomiting Anesthetic complications: yes Comments: Inferior tongue laceration during attempt at LMA placement. Repaired by ENT without complications. Please see intraoperative notes.    No notable events documented.  Last Vitals:  Vitals:   02/18/21 0930 02/18/21 1000  BP: 109/90 (!) 95/55  Pulse: (!) 113 (!) 110  Resp: 15 16  Temp:    SpO2:      Last Pain:  Vitals:   02/18/21 0735  TempSrc: Oral  PainSc: 0-No pain                 Willa Brocks L Tiera Mensinger

## 2021-02-18 NOTE — Progress Notes (Signed)
   Subjective:  Kyle Walton is a 65 y.o. male, 1 Day Post-Op    s/p Procedure(s): IRRIGATION AND DEBRIDEMENT RIGHT THIGH APPLICATION OF WOUND VAC LACERATION REPAIR OF TONGUE   Patient reports pain as moderate.  Reports that his leg is doing well but does have pain.  Reports that he would like to be on the "Dilaudid medication" while he is in the hospital.  He recent returned from dialysis.  Reports he feels fine, is tachycardic on monitor, denies shortness of breath or chest pain.  Reports some numbness at baseline in the right foot.  Objective:  Constitutional: General Appearance: NAD in hospital bed Level of Distress: no acute distress. Eyes: Lens (normal) clear: both eyes. Head: Head: normocephalic and atraumatic. Lungs: Respiratory effort: no dyspnea. Skin: Inspection and palpation: no rash, lesions Neurologic: Cranial Nerves: grossly intact. Sensation: grossly intact.  Right lower extremity:  Wound VAC present on lateral thigh with appropriate suction.  No drainage, dressing is clean.  No significant edema.  Status post amputation of all 5 toes, Mepilex dressings on the heel.  Able to feel sensation to light touch the ankle lower leg and knee.  Able to gently move the knee and ankle.  No acute signs of infection, no significant erythema noted.Marland Kitchen  1+ DP pulse.  VITALS:   Vitals:   02/18/21 1100 02/18/21 1130 02/18/21 1240 02/18/21 1249  BP: (!) 84/41 (!) '95/56 95/65 95/65 '$  Pulse: (!) 115   (!) 104  Resp: '20  20 18  '$ Temp:  97.7 F (36.5 C) 98.2 F (36.8 C) 98.2 F (36.8 C)  TempSrc:  Oral Oral   SpO2:  100% 90% 94%  Weight:  102.4 kg    Height:        .km Lab Results  Component Value Date   WBC 14.5 (H) 02/18/2021   HGB 8.4 (L) 02/18/2021   HCT 27.9 (L) 02/18/2021   MCV 93.9 02/18/2021   PLT 229 02/18/2021   BMET    Component Value Date/Time   NA 134 (L) 02/18/2021 1350   K 3.3 (L) 02/18/2021 1350   CL 97 (L) 02/18/2021 1350   CO2 26 02/18/2021 1350    GLUCOSE 109 (H) 02/18/2021 1350   BUN 19 02/18/2021 1350   CREATININE 3.33 (H) 02/18/2021 1350   CALCIUM 7.8 (L) 02/18/2021 1350   GFRNONAA 20 (L) 02/18/2021 1350   GFRAA 6 (L) 01/17/2020 1605     Assessment/Plan: 1 Day Post-Op   Active Problems:   CAD (coronary artery disease)   Diabetes mellitus with peripheral vascular disease (HCC)   ESRD on hemodialysis (HCC)   Acquired hypothyroidism   Open thigh wound, right, sequela   Wound infection   Up with therapy  Wound care: Patient will maintain wound VAC until 5 days postop.  At that time we will have it removed.  Sutures will be maintained until 2 weeks status post surgery, if discharged by that time will be removed outpatient at the office.  Weightbearing Status: Weightbearing as tolerated, recommend moving the hip knee and ankle as tolerated to maintain motion DVT Prophylaxis: Eliquis per primary  Antibiotics: Per infectious disease   [name] 02/18/2021, 4:04 PM  Jonelle Sidle PA-C  Physician Assistant with Dr. Lillia Abed [facility]

## 2021-02-18 NOTE — TOC Progression Note (Signed)
Transition of Care [facility]) - Progression Note    Patient Details  Name: Kyle Walton MRN: [id number] Date of Birth: 1956-03-25  Transition of Care [facility]) CM/SW [place], [place] Phone Number: 02/18/2021, 2:32 PM  Clinical Narrative:    CSW was notified by [facility] that pt's wife, Delcie Roch had called and said she was looking for LTC for this pt. [facility] noted they would not be able to take this pt for LTC. Another barrier is that pt is into his copay days, which makes it hard for pt's wife to afford PT. CSW spoke with pt's wife who noted she was attempting to get LT Medicaid for pt, but there have been roadblocks. CSW informed her that Medicaid won't pay for Lt rehab, just nursing facility placement. She stated that she is hoping pt won't need LTC, but wants to be prepared. She is very frustrated with [facility], as they have not taken good care of the pt, however pt wants to return as they have good PT. They want pt to be able to return home, but that may no be an option. [facility] is following for placement if insurance authorizes. Pt's wife is blind and disabled, and not able to provide care to his level. TOC will continue to follow for DC planning.   Expected Discharge Plan: Skilled Nursing Facility Barriers to Discharge: Ship broker, Continued Medical Work up  Expected Discharge Plan and Services Expected Discharge Plan: [place] In-house Referral: Clinical Social Work   Post Acute Care Choice: [place] Living arrangements for the past 2 months: [place]                                       Social Determinants of Health (SDOH) Interventions    Readmission Risk Interventions Readmission Risk Prevention Plan 02/16/2021 01/28/2021  Transportation Screening Complete Complete  Medication Review (RN Care Manager) Referral to Pharmacy Referral to Pharmacy  PCP or Specialist appointment within 3-5 days of discharge Complete Complete   HRI or Home Care Consult Complete Complete  SW Recovery Care/Counseling Consult Complete Complete  Palliative Care Screening Not Applicable Not Applicable  Skilled Nursing Facility Complete Complete

## 2021-02-18 NOTE — Progress Notes (Signed)
PROGRESS NOTE    Kyle Walton  [id number] DOB: 11/28/1955 DOA: 02/24/2021 PCP: Patient, No Pcp Per (Inactive)   Brief Narrative: 75 chronically ill with ESRD, anemia of chronic disease, type 2 diabetes, chronic diastolic heart failure, paroxysmal A. fib on Eliquis, COPD chronic hypoxic respiratory failure on 2 L of oxygen, nocturnal BiPAP, hypothyroidism recently discharged from [facility] 7/26: Treated for right thigh abscess, underwent I&D on 7/14.  Completed 7 days of IV therapy inpatient and discharged to SNF on oral ciprofloxacin, which he  may not have started.  Was seen by the wound nurse 8/1, noted to have increasing swelling tenderness and drainage from the wound, subsequently sent to the ED.  In the emergency room was briefly hypotensive, subsequently stabilized, started on broad-spectrum antibiotics, Ortho consulted, initially recommended antibiotics.  Treated with broad-spectrum antibiotics. Underwent incision  and drainage of wound 8/11.   Assessment & Plan:   Active Problems:   CAD (coronary artery disease)   Diabetes mellitus with peripheral vascular disease ([place])   ESRD on hemodialysis ([place])   Acquired hypothyroidism   Open thigh wound, right, sequela   Wound infection     Pressure Injury 12/11/20 Sacrum Right;Left;Mid Stage 2 -  Partial thickness loss of dermis presenting as a shallow open injury with a red, pink wound bed without slough. (Active)  12/11/20 0551  Location: Sacrum  Location Orientation: Right;Left;Mid  Staging: Stage 2 -  Partial thickness loss of dermis presenting as a shallow open injury with a red, pink wound bed without slough.  Wound Description (Comments):   Present on Admission:     1-Right Thigh Abscess:  -Recent I and D on 7/14, culture grew Serratia resistant to Ancef. (Prior admission)  -Admitted with clinical worsening wound, CT concern for developing compartment syndrome, substantially reduced volume of the mixed density  collection right Vastus Lateralis. Locules of subcutaneous gas density track along approximately 13 cm.  -Appreciate orthopedic follow-up, sutures removed to allow drainage. -Underwent I and D of wound and application of negative pressure wound dressing. 8/11. -plan for two weeks Cefepime with HD End date 03/03/2021. Per ID recommendations.  -Wound Vac in place. Follow Ortho recommendation.  2-ESRD on hemodialysis: Dialysis TTS.  Diabetes type 2: Continue with Lantus.  Chronic hypotension: Continue with midodrine  Chronic Diastolic heart failure: Volume managed with hemodialysis.  CAD/CABG: Beta-blocker and statin Hold Plavix for repeat I&D  Paroxysmal A. fib: Continue with Toprol amiodarone Ok to resume Eliquis, discussed with Legrand Como PA with ortho.  Appreciate cardiology/   COPD/chronic respiratory failure: Continue with BiPAP nightly  History of PAD: Prior to amputation and metatarsal amputation.  Continue with statin and Plavix on hold  Anemia of chronic disease: Continue with Epogen with hemodialysis. Hb down to 7.4 monitor , might need blood transfusion.   Loose stool; started florastore.  Estimated body mass index is 32.39 kg/m as calculated from the following:   Height as of this encounter: '5\' 10"'$  (1.778 m).   Weight as of this encounter: 102.4 kg.   DVT prophylaxis: SCD Code Status: Full Code Family Communication: care discussed with patient.  Disposition Plan:  Status is: Inpatient  Remains inpatient appropriate because:IV treatments appropriate due to intensity of illness or inability to take PO  Dispo: The patient is from: SNF              Anticipated d/c is to: SNF              Patient currently is not medically  stable to d/c.   Difficult to place patient No        Consultants:  Ortho Nephrology Cardiology  Procedures:  I and D   Antimicrobials:    Subjective: No new complaints. He wants to stay in the hospital until he is  better.  Objective: Vitals:   02/18/21 1100 02/18/21 1130 02/18/21 1240 02/18/21 1249  BP: (!) 84/41 (!) '95/56 95/65 95/65 '$  Pulse: (!) 115   (!) 104  Resp: '20  20 18  '$ Temp:  97.7 F (36.5 C) 98.2 F (36.8 C) 98.2 F (36.8 C)  TempSrc:  Oral Oral   SpO2:  100% 90% 94%  Weight:  102.4 kg    Height:        Intake/Output Summary (Last 24 hours) at 02/18/2021 1426 Last data filed at 02/18/2021 1117 Gross per 24 hour  Intake 240 ml  Output 150 ml  Net 90 ml    Filed Weights   02/16/2021 0420 02/18/21 0735 02/18/21 1130  Weight: 102.6 kg 102.6 kg 102.4 kg    Examination:  General exam: NAD Respiratory system: CTA Cardiovascular system: S 1, S 2 RRR Gastrointestinal system: BS present, soft, nt Central nervous system: Alert Extremities: Right thigh with wound vac    Data Reviewed: I have personally reviewed following labs and imaging studies  CBC: Recent Labs  Lab 02/14/21 0128 02/15/21 0134 02/16/21 0242 02/24/2021 1439 02/18/21 0834  WBC 10.5 11.9* 11.9* 12.0* 12.8*  HGB 8.3* 8.0* 8.1* 8.1* 7.4*  HCT 28.2* 27.1* 27.0* 28.0* 25.9*  MCV 95.3 94.8 94.1 95.6 95.2  PLT 198 214 232 263 0000000    Basic Metabolic Panel: Recent Labs  Lab 02/12/21 0124 02/15/21 0134 02/16/21 0242 03/03/2021 0047 02/18/21 0833  NA 130* 132* 134* 134* 135  K 4.1 4.5 3.5 3.4* 3.7  CL 97* 98 100 102 101  CO2 '23 23 25 '$ 19* 22  GLUCOSE 144* 94 147* 110* 156*  BUN 30* 42* 24* 33* 46*  CREATININE 4.54* 5.38* 3.77* 4.85* 6.31*  CALCIUM 8.3* 8.3* 7.8* 7.7* 7.9*  PHOS  --   --   --  3.9 6.3*    GFR: Estimated Creatinine Clearance: 14 mL/min (A) (by C-G formula based on SCr of 6.31 mg/dL (H)). Liver Function Tests: Recent Labs  Lab 02/27/2021 0047 02/18/21 0833  ALBUMIN 1.8* 1.8*    No results for input(s): LIPASE, AMYLASE in the last 168 hours. No results for input(s): AMMONIA in the last 168 hours. Coagulation Profile: No results for input(s): INR, PROTIME in the last 168  hours. Cardiac Enzymes: No results for input(s): CKTOTAL, CKMB, CKMBINDEX, TROPONINI in the last 168 hours. BNP (last 3 results) No results for input(s): PROBNP in the last 8760 hours. HbA1C: No results for input(s): HGBA1C in the last 72 hours. CBG: Recent Labs  Lab 02/26/2021 1056 02/18/2021 1612 03/06/2021 2008 02/18/21 0629 02/18/21 1216  GLUCAP 111* 132* 178* 164* 154*    Lipid Profile: No results for input(s): CHOL, HDL, LDLCALC, TRIG, CHOLHDL, LDLDIRECT in the last 72 hours. Thyroid Function Tests: No results for input(s): TSH, T4TOTAL, FREET4, T3FREE, THYROIDAB in the last 72 hours. Anemia Panel: No results for input(s): VITAMINB12, FOLATE, FERRITIN, TIBC, IRON, RETICCTPCT in the last 72 hours. Sepsis Labs: No results for input(s): PROCALCITON, LATICACIDVEN in the last 168 hours.  Recent Results (from the past 240 hour(s))  Surgical PCR screen     Status: Abnormal   Collection Time: 02/16/21 11:19 PM   Specimen: Nasal Mucosa; Nasal  Swab  Result Value Ref Range Status   MRSA, PCR NEGATIVE NEGATIVE Final   Staphylococcus aureus POSITIVE (A) NEGATIVE Final    Comment: (NOTE) The Xpert SA Assay (FDA approved for NASAL specimens in patients 39 years of age and older), is one component of a comprehensive surveillance program. It is not intended to diagnose infection nor to guide or monitor treatment. Performed at [facility], [place] [address] [place], [place] [address]           Radiology Studies: No results found.      Scheduled Meds:  (feeding supplement) PROSource Plus  30 mL Oral BID BM   amiodarone  200 mg Oral BID   apixaban  5 mg Oral BID   atorvastatin  40 mg Oral QPM   chlorhexidine  15 mL Mouth Rinse BID   Chlorhexidine Gluconate Cloth  6 each Topical Q0600   Chlorhexidine Gluconate Cloth  6 each Topical Q0600   cholestyramine light  4 g Oral BID   collagenase   Topical Daily   [START ON 02/21/2021] darbepoetin (ARANESP) injection -  DIALYSIS  200 mcg Intravenous Q Mon-HD   docusate sodium  100 mg Oral BID   fentaNYL  1 patch Transdermal Q72H   ferric citrate  420 mg Oral BID WC   Gerhardt's butt cream   Topical TID   insulin aspart  0-5 Units Subcutaneous QHS   insulin aspart  0-9 Units Subcutaneous TID WC   insulin glargine-yfgn  5 Units Subcutaneous QHS   latanoprost  1 drop Both Eyes QHS   levothyroxine  25 mcg Oral QAC breakfast   mouth rinse  15 mL Mouth Rinse q12n4p   metoprolol succinate  12.5 mg Oral Daily   midodrine  10 mg Oral TID WC   multivitamin  1 tablet Oral QHS   mupirocin ointment  1 application Nasal BID   pantoprazole  40 mg Oral Q0600   povidone-iodine  2 application Topical Once   pregabalin  25 mg Oral BID   saccharomyces boulardii  250 mg Oral BID   Continuous Infusions:  sodium chloride     sodium chloride     sodium chloride     sodium chloride     ceFEPime (MAXIPIME) IV Stopped (02/18/21 1224)     LOS: 11 days    Time spent: 35  minutes.     Elmarie Shiley, MD [facility]   If 7PM-7AM, please contact night-coverage [email or web address]  02/18/2021, 2:26 PM

## 2021-02-18 NOTE — Progress Notes (Signed)
OT Cancellation Note  Patient Details Name: Khadin Bresette MRN: [id number] DOB: [birth year]   Cancelled Treatment:    Reason Eval/Treat Not Completed: Medical issues which prohibited therapy (Pt in HD.)  [name] 02/18/2021, 8:20 AM Nestor Lewandowsky, OTR/L Acute Rehabilitation Services Pager: [phone] Office: [phone]

## 2021-02-18 NOTE — Progress Notes (Signed)
Occupational Therapy Treatment Patient Details Name: Kyle Walton MRN: [id number] DOB: 1956-06-02 Today's Date: 02/18/2021    History of present illness Pt is a 65 year old man admitted on 03/06/2021 with non healing R thigh wound. He was admitted in July R thigh infection with hospital course complicated by post op acute respiratior failure, he discharged to SNF for rehab. PMH: ESRD, DM2, morbid obesity, CHF, PAF, chronic hypoxic respiratory failure and hypothyroidism.   OT comments  Session limited to bed level light ADL of grooming, UB dressing and pericare due to resting HR in low 120s. Pt s/p HD and fatigued.   Follow Up Recommendations  SNF    Equipment Recommendations  None recommended by OT    Recommendations for Other Services      Precautions / Restrictions Precautions Precautions: Fall Precaution Comments: wound vac, watch HR       Mobility Bed Mobility Overal bed mobility: Needs Assistance Bed Mobility: Rolling Rolling: Min assist         General bed mobility comments: min assist to complete roll for pericare    Transfers                      Balance                                           ADL either performed or assessed with clinical judgement   ADL Overall ADL's : Needs assistance/impaired     Grooming: Wash/dry hands;Wash/dry face;Bed level;Set up           Upper Body Dressing : Minimal assistance;Bed level           Toileting- Clothing Manipulation and Hygiene: Total assistance;Bed level               Vision   Additional Comments: macular degeneration   Perception     Praxis      Cognition Arousal/Alertness: Awake/alert Behavior During Therapy: WFL for tasks assessed/performed Overall Cognitive Status: No family/caregiver present to determine baseline cognitive functioning                                          Exercises     Shoulder Instructions       General  Comments      Pertinent Vitals/ Pain       Pain Assessment: No/denies pain  Home Living                                          Prior Functioning/Environment              Frequency  Min 2X/week        Progress Toward Goals  OT Goals(current goals can now be found in the care plan section)  Progress towards OT goals: Not progressing toward goals - comment (limited by HR)  Acute Rehab OT Goals Patient Stated Goal: To be able to walk again OT Goal Formulation: With patient Time For Goal Achievement: 03/02/21 Potential to Achieve Goals: [place] Discharge plan remains appropriate    Co-evaluation                 AM-PAC  OT "6 Clicks" Daily Activity     Outcome Measure   Help from another person eating meals?: None Help from another person taking care of personal grooming?: A Little Help from another person toileting, which includes using toliet, bedpan, or urinal?: Total Help from another person bathing (including washing, rinsing, drying)?: A Lot Help from another person to put on and taking off regular upper body clothing?: A Little Help from another person to put on and taking off regular lower body clothing?: Total 6 Click Score: 14    End of Session    OT Visit Diagnosis: Muscle weakness (generalized) (M62.81);Pain;Other symptoms and signs involving cognitive function   Activity Tolerance Treatment limited secondary to medical complications (Comment) (HR in 120s at rest)   Patient Left in bed;with call bell/phone within reach;with bed alarm set   Nurse Communication  (RN aware of HR)        Time: 1440-1510 OT Time Calculation (min): 30 min  Charges: OT General Charges $OT Visit: 1 Visit OT Treatments $Self Care/Home Management : 23-37 mins  Nestor Lewandowsky, OTR/L Acute Rehabilitation Services Pager: [phone] Office: [phone]    [name] 02/18/2021, 3:13 PM

## 2021-02-19 LAB — GLUCOSE, CAPILLARY
Glucose-Capillary: 151 mg/dL — ABNORMAL HIGH (ref 70–99)
Glucose-Capillary: 130 mg/dL — ABNORMAL HIGH (ref 70–99)
Glucose-Capillary: 154 mg/dL — ABNORMAL HIGH (ref 70–99)
Glucose-Capillary: 161 mg/dL — ABNORMAL HIGH (ref 70–99)

## 2021-02-19 LAB — CBC
HCT: 25.5 % — ABNORMAL LOW (ref 39.0–52.0)
Hemoglobin: 7.5 g/dL — ABNORMAL LOW (ref 13.0–17.0)
MCH: 27.5 pg (ref 26.0–34.0)
MCHC: 29.4 g/dL — ABNORMAL LOW (ref 30.0–36.0)
MCV: 93.4 fL (ref 80.0–100.0)
Platelets: 228 10*3/uL (ref 150–400)
RBC: 2.73 MIL/uL — ABNORMAL LOW (ref 4.22–5.81)
RDW: 20.3 % — ABNORMAL HIGH (ref 11.5–15.5)
WBC: 11.7 10*3/uL — ABNORMAL HIGH (ref 4.0–10.5)
nRBC: 0 % (ref 0.0–0.2)

## 2021-02-19 MED ORDER — LEVOTHYROXINE SODIUM 25 MCG PO TABS
25.0000 ug | ORAL_TABLET | Freq: Every day | ORAL | Status: DC
  Administered 2021-02-20 – 2021-03-01 (×10): 25 ug via ORAL
  Filled 2021-02-19 (×10): qty 1

## 2021-02-19 NOTE — Plan of Care (Signed)

## 2021-02-19 NOTE — Progress Notes (Signed)
[facility] Progress Note   Subjective:    Seen and examined patient at bedside. No complaints. Reports doing well. Denies SOB and CP. Had some hypotensive episodes during yesterday's HD-UF held at one point and NS boluses were given. Net UF only 150cc. Bps soft but stable currently. Plan for HD 02/21/21. Awaiting new SNF placement.  Objective Vitals:   02/19/21 0458 02/19/21 0730 02/19/21 0823 02/19/21 1142  BP:  (!) 87/48 109/60 109/60  Pulse:  95 93 90  Resp:  20  20  Temp:  97.9 F (36.6 C)  97.6 F (36.4 C)  TempSrc:  Oral  Oral  SpO2:  99%  95%  Weight: 104.1 kg     Height:       Physical Exam General: Chronically ill-appearing male; NAD Heart: Normal S1 and S2; No murmurs, rubs, and gallops Lungs: Cleart throughout; No wheezing, rales, or rhonchi Abdomen: No ABD edema, soft, non-tender, (+) BS Extremities: No edema BLLE-wound vac to RLE in place-no drainage noted. Dialysis Access: [facility]   Filed Weights   02/18/21 0735 02/18/21 1130 02/19/21 0458  Weight: 102.6 kg 102.4 kg 104.1 kg    Intake/Output Summary (Last 24 hours) at 02/19/2021 1413 Last data filed at 02/18/2021 2200 Gross per 24 hour  Intake 220 ml  Output --  Net 220 ml    Additional Objective Labs: Basic Metabolic Panel: Recent Labs  Lab 02/12/2021 0047 02/18/21 0833 02/18/21 1350  NA 134* 135 134*  K 3.4* 3.7 3.3*  CL 102 101 97*  CO2 19* 22 26  GLUCOSE 110* 156* 109*  BUN 33* 46* 19  CREATININE 4.85* 6.31* 3.33*  CALCIUM 7.7* 7.9* 7.8*  PHOS 3.9 6.3* 3.1   Liver Function Tests: Recent Labs  Lab 02/23/2021 0047 02/18/21 0833 02/18/21 1350  ALBUMIN 1.8* 1.8* 2.0*   No results for input(s): LIPASE, AMYLASE in the last 168 hours. CBC: Recent Labs  Lab 02/16/21 0242 03/04/2021 1439 02/18/21 0834 02/18/21 1350 02/19/21 0043  WBC 11.9* 12.0* 12.8* 14.5* 11.7*  HGB 8.1* 8.1* 7.4* 8.4* 7.5*  HCT 27.0* 28.0* 25.9* 27.9* 25.5*  MCV 94.1 95.6 95.2 93.9 93.4  PLT 232 263  233 229 228   Blood Culture    Component Value Date/Time   SDES BLOOD RIGHT ANTECUBITAL 02/24/2021 1110   SPECREQUEST  03/06/2021 1110    BOTTLES DRAWN AEROBIC ONLY Blood Culture adequate volume   CULT  02/19/2021 1110    NO GROWTH 5 DAYS Performed at [facility] [facility], [place] [address], [place], [place] [address]    REPTSTATUS 02/12/2021 FINAL 02/19/2021 1110    Cardiac Enzymes: No results for input(s): CKTOTAL, CKMB, CKMBINDEX, TROPONINI in the last 168 hours. CBG: Recent Labs  Lab 02/18/21 1216 02/18/21 1612 02/18/21 2116 02/19/21 0751 02/19/21 1145  GLUCAP 154* 145* 134* 151* 130*   Iron Studies: No results for input(s): IRON, TIBC, TRANSFERRIN, FERRITIN in the last 72 hours. Lab Results  Component Value Date   INR 1.9 (H) 02/22/2021   INR 2.6 (H) 01/18/2021   INR 1.5 (H) 12/04/2020   Studies/Results: No results found.  Medications:  sodium chloride     sodium chloride     sodium chloride     sodium chloride     [START ON 02/21/2021] ceFEPime (MAXIPIME) IV      (feeding supplement) PROSource Plus  30 mL Oral BID BM   amiodarone  200 mg Oral BID   apixaban  5 mg Oral BID  atorvastatin  40 mg Oral QPM   chlorhexidine  15 mL Mouth Rinse BID   Chlorhexidine Gluconate Cloth  6 each Topical Q0600   Chlorhexidine Gluconate Cloth  6 each Topical Q0600   cholestyramine light  4 g Oral BID   collagenase   Topical Daily   [START ON 02/23/2021] darbepoetin (ARANESP) injection - DIALYSIS  200 mcg Intravenous Q Wed-HD   docusate sodium  100 mg Oral BID   fentaNYL  1 patch Transdermal Q72H   ferric citrate  420 mg Oral BID WC   Gerhardt's butt cream   Topical TID   insulin aspart  0-5 Units Subcutaneous QHS   insulin aspart  0-9 Units Subcutaneous TID WC   insulin glargine-yfgn  5 Units Subcutaneous QHS   latanoprost  1 drop Both Eyes QHS   [START ON 02/20/2021] levothyroxine  25 mcg Oral QAC breakfast   mouth rinse  15 mL Mouth Rinse q12n4p   metoprolol succinate   12.5 mg Oral Daily   midodrine  10 mg Oral TID WC   multivitamin  1 tablet Oral QHS   mupirocin ointment  1 application Nasal BID   pantoprazole  40 mg Oral Q0600   povidone-iodine  2 application Topical Once   pregabalin  25 mg Oral BID   saccharomyces boulardii  250 mg Oral BID    Dialysis Orders: [facility]: MWF 4 hrs F250 450/800 3.0K/2.5 Ca LIJ TDC  EDW 111 kg -Heparin 4900 units IV initial bolus, 500 units hourly, DC last hour of tx  Assessment/Plan: R. Thigh Abscess: Continue cepepime, per ID: d/c vanc. To OR this AM for I & D. Now with wound Vac. Per primary.   ESRD - On HD MWF. Had hypotensive episodes during yesterday's HD-on Midodrine. Minimal net UF tolerated. Can give extra Midodrine dose mid-treatment if needed. Bps soft but stable currently. Next HD 02/21/2021. Hypertension/volume-Have attempted to challenge and lower EDW since admit. Nadir wt 101.5 kg 02/12/21. Using Midodrine with extra 10 mg PO mid run to offset intradialyic hypotension. Continue lowering volume as tolerated. Anemia  - HGB variable. From 8/12: 7.4-8.4-now 7.5 from 02/19/21). Aranesp dose recently raised for next HD. Give blood transfusion if needed with HD. Follow trends. Metabolic bone disease -  C Ca 9.96, VDRA is on hold. PO4 trending up-now 6.3. DC 'd Calcium acetate d/t intolerance-recently raised auryxia to 2 tab PO BID. Nutrition - Albumin 2.5  Renal/Carb mod diet with protein supps, renal vits. DMT2-per primary Hypothyroidism-per primary AFIB: On amiodarone, apixaban and low-dose metoprolol and was scheduled for cardioversion but this has not happened yet. Seen by Dr. Angelena Form 02/16/2021. Needs uninterrupted anticoagulation for 3 weeks then DCCV.   9. Disposition: Does NOT wish to return to  previous SNF. Wanting SNF closer to his home in [place]. Awaiting new SNF placement.  Tobie Poet, NP [facility] 02/19/2021,2:13 PM  LOS: 12 days

## 2021-02-19 NOTE — Progress Notes (Signed)
PROGRESS NOTE    Kyle Walton  [id number] DOB: 18-Sep-1955 DOA: 02/21/2021 PCP: Patient, No Pcp Per (Inactive)   Brief Narrative: 65 chronically ill with ESRD, anemia of chronic disease, type 2 diabetes, chronic diastolic heart failure, paroxysmal A. fib on Eliquis, COPD chronic hypoxic respiratory failure on 2 L of oxygen, nocturnal BiPAP, hypothyroidism recently discharged from [facility] 7/26: Treated for right thigh abscess, underwent I&D on 7/14.  Completed 7 days of IV therapy inpatient and discharged to SNF on oral ciprofloxacin, which he  may not have started.  Was seen by the wound nurse 8/1, noted to have increasing swelling tenderness and drainage from the wound, subsequently sent to the ED.  In the emergency room was briefly hypotensive, subsequently stabilized, started on broad-spectrum antibiotics, Ortho consulted, initially recommended antibiotics.  Treated with broad-spectrum antibiotics. Underwent incision  and drainage of wound 8/11.   Assessment & Plan:   Active Problems:   CAD (coronary artery disease)   Diabetes mellitus with peripheral vascular disease ([place])   ESRD on hemodialysis ([place])   Acquired hypothyroidism   Open thigh wound, right, sequela   Wound infection     Pressure Injury 12/11/20 Sacrum Right;Left;Mid Stage 2 -  Partial thickness loss of dermis presenting as a shallow open injury with a red, pink wound bed without slough. (Active)  12/11/20 0551  Location: Sacrum  Location Orientation: Right;Left;Mid  Staging: Stage 2 -  Partial thickness loss of dermis presenting as a shallow open injury with a red, pink wound bed without slough.  Wound Description (Comments):   Present on Admission:     1-Right Thigh Abscess:  -Recent I and D on 7/14, culture grew Serratia resistant to Ancef. (Prior admission)  -Admitted with clinical worsening wound, CT concern for developing compartment syndrome, substantially reduced volume of the mixed density  collection right Vastus Lateralis. Locules of subcutaneous gas density track along approximately 13 cm.  -Appreciate orthopedic follow-up, sutures removed to allow drainage. -Underwent I and D of wound and application of negative pressure wound dressing. 8/11. -plan for two weeks Cefepime with HD End date 03/03/2021. Per ID recommendations.  -Wound Vac in place. Follow Ortho recommendation. Wound vac need to remain for 5 days post op,   2-ESRD on hemodialysis: Dialysis TTS.  Diabetes type 2: Continue with Lantus.  Chronic hypotension: Continue with midodrine  Chronic Diastolic heart failure: Volume managed with hemodialysis.  CAD/CABG: Beta-blocker and statin Hold Plavix for repeat I&D  Paroxysmal A. fib: Continue with Toprol amiodarone Ok to resume Eliquis, discussed with Legrand Como PA with ortho.  Appreciate cardiology/   COPD/chronic respiratory failure: Continue with BiPAP nightly  History of PAD: Prior to amputation and metatarsal amputation.  Continue with statin and Plavix on hold  Anemia of chronic disease: Continue with Epogen with hemodialysis. Hb down to 7.5 On Aranesp.   Loose stool; started florastore.  Estimated body mass index is 32.93 kg/m as calculated from the following:   Height as of this encounter: '5\' 10"'$  (1.778 m).   Weight as of this encounter: 104.1 kg.   DVT prophylaxis: SCD Code Status: Full Code Family Communication: care discussed with patient.  Disposition Plan:  Status is: Inpatient  Remains inpatient appropriate because:IV treatments appropriate due to intensity of illness or inability to take PO  Dispo: The patient is from: SNF              Anticipated d/c is to: SNF  Patient currently is not medically stable to d/c.   Difficult to place patient No        Consultants:  Ortho Nephrology Cardiology  Procedures:  I and D   Antimicrobials:    Subjective: Pain is controlled with current regimen.    Objective: Vitals:   02/19/21 0458 02/19/21 0730 02/19/21 0823 02/19/21 1142  BP:  (!) 87/48 109/60 109/60  Pulse:  95 93 90  Resp:  20  20  Temp:  97.9 F (36.6 C)  97.6 F (36.4 C)  TempSrc:  Oral  Oral  SpO2:  99%  95%  Weight: 104.1 kg     Height:        Intake/Output Summary (Last 24 hours) at 02/19/2021 1319 Last data filed at 02/18/2021 2200 Gross per 24 hour  Intake 220 ml  Output --  Net 220 ml    Filed Weights   02/18/21 0735 02/18/21 1130 02/19/21 0458  Weight: 102.6 kg 102.4 kg 104.1 kg    Examination:  General exam: NAD Respiratory system: CTA Cardiovascular system: S 1, S 2 RRR Gastrointestinal system: BS present,soft, nt Central nervous system: Alert Extremities: Right thigh with wound vac    Data Reviewed: I have personally reviewed following labs and imaging studies  CBC: Recent Labs  Lab 02/16/21 0242 02/24/2021 1439 02/18/21 0834 02/18/21 1350 02/19/21 0043  WBC 11.9* 12.0* 12.8* 14.5* 11.7*  HGB 8.1* 8.1* 7.4* 8.4* 7.5*  HCT 27.0* 28.0* 25.9* 27.9* 25.5*  MCV 94.1 95.6 95.2 93.9 93.4  PLT 232 263 233 229 [id number]    Basic Metabolic Panel: Recent Labs  Lab 02/15/21 0134 02/16/21 0242 02/26/2021 0047 02/18/21 0833 02/18/21 1350  NA 132* 134* 134* 135 134*  K 4.5 3.5 3.4* 3.7 3.3*  CL 98 100 102 101 97*  CO2 23 25 19* 22 26  GLUCOSE 94 147* 110* 156* 109*  BUN 42* 24* 33* 46* 19  CREATININE 5.38* 3.77* 4.85* 6.31* 3.33*  CALCIUM 8.3* 7.8* 7.7* 7.9* 7.8*  PHOS  --   --  3.9 6.3* 3.1    GFR: Estimated Creatinine Clearance: 26.7 mL/min (A) (by C-G formula based on SCr of 3.33 mg/dL (H)). Liver Function Tests: Recent Labs  Lab 02/28/2021 0047 02/18/21 0833 02/18/21 1350  ALBUMIN 1.8* 1.8* 2.0*    No results for input(s): LIPASE, AMYLASE in the last 168 hours. No results for input(s): AMMONIA in the last 168 hours. Coagulation Profile: No results for input(s): INR, PROTIME in the last 168 hours. Cardiac Enzymes: No results  for input(s): CKTOTAL, CKMB, CKMBINDEX, TROPONINI in the last 168 hours. BNP (last 3 results) No results for input(s): PROBNP in the last 8760 hours. HbA1C: No results for input(s): HGBA1C in the last 72 hours. CBG: Recent Labs  Lab 02/18/21 1216 02/18/21 1612 02/18/21 2116 02/19/21 0751 02/19/21 1145  GLUCAP 154* 145* 134* 151* 130*    Lipid Profile: No results for input(s): CHOL, HDL, LDLCALC, TRIG, CHOLHDL, LDLDIRECT in the last 72 hours. Thyroid Function Tests: No results for input(s): TSH, T4TOTAL, FREET4, T3FREE, THYROIDAB in the last 72 hours. Anemia Panel: No results for input(s): VITAMINB12, FOLATE, FERRITIN, TIBC, IRON, RETICCTPCT in the last 72 hours. Sepsis Labs: No results for input(s): PROCALCITON, LATICACIDVEN in the last 168 hours.  Recent Results (from the past 240 hour(s))  Surgical PCR screen     Status: Abnormal   Collection Time: 02/16/21 11:19 PM   Specimen: Nasal Mucosa; Nasal Swab  Result Value Ref Range Status  MRSA, PCR NEGATIVE NEGATIVE Final   Staphylococcus aureus POSITIVE (A) NEGATIVE Final    Comment: (NOTE) The Xpert SA Assay (FDA approved for NASAL specimens in patients 30 years of age and older), is one component of a comprehensive surveillance program. It is not intended to diagnose infection nor to guide or monitor treatment. Performed at [facility], [place] [address] [place], [place] [address]           Radiology Studies: No results found.      Scheduled Meds:  (feeding supplement) PROSource Plus  30 mL Oral BID BM   amiodarone  200 mg Oral BID   apixaban  5 mg Oral BID   atorvastatin  40 mg Oral QPM   chlorhexidine  15 mL Mouth Rinse BID   Chlorhexidine Gluconate Cloth  6 each Topical Q0600   Chlorhexidine Gluconate Cloth  6 each Topical Q0600   cholestyramine light  4 g Oral BID   collagenase   Topical Daily   [START ON 02/23/2021] darbepoetin (ARANESP) injection - DIALYSIS  200 mcg Intravenous Q Wed-HD    docusate sodium  100 mg Oral BID   fentaNYL  1 patch Transdermal Q72H   ferric citrate  420 mg Oral BID WC   Gerhardt's butt cream   Topical TID   insulin aspart  0-5 Units Subcutaneous QHS   insulin aspart  0-9 Units Subcutaneous TID WC   insulin glargine-yfgn  5 Units Subcutaneous QHS   latanoprost  1 drop Both Eyes QHS   [START ON 02/20/2021] levothyroxine  25 mcg Oral QAC breakfast   mouth rinse  15 mL Mouth Rinse q12n4p   metoprolol succinate  12.5 mg Oral Daily   midodrine  10 mg Oral TID WC   multivitamin  1 tablet Oral QHS   mupirocin ointment  1 application Nasal BID   pantoprazole  40 mg Oral Q0600   povidone-iodine  2 application Topical Once   pregabalin  25 mg Oral BID   saccharomyces boulardii  250 mg Oral BID   Continuous Infusions:  sodium chloride     sodium chloride     sodium chloride     sodium chloride     [START ON 02/21/2021] ceFEPime (MAXIPIME) IV       LOS: 12 days    Time spent: 35  minutes.     Elmarie Shiley, MD [facility]   If 7PM-7AM, please contact night-coverage [email or web address]  02/19/2021, 1:19 PM

## 2021-02-20 LAB — GLUCOSE, CAPILLARY
Glucose-Capillary: 203 mg/dL — ABNORMAL HIGH (ref 70–99)
Glucose-Capillary: 154 mg/dL — ABNORMAL HIGH (ref 70–99)
Glucose-Capillary: 271 mg/dL — ABNORMAL HIGH (ref 70–99)
Glucose-Capillary: 260 mg/dL — ABNORMAL HIGH (ref 70–99)

## 2021-02-20 LAB — CBC
HCT: 25.6 % — ABNORMAL LOW (ref 39.0–52.0)
Hemoglobin: 7.9 g/dL — ABNORMAL LOW (ref 13.0–17.0)
MCH: 28.7 pg (ref 26.0–34.0)
MCHC: 30.9 g/dL (ref 30.0–36.0)
MCV: 93.1 fL (ref 80.0–100.0)
Platelets: 186 10*3/uL (ref 150–400)
RBC: 2.75 MIL/uL — ABNORMAL LOW (ref 4.22–5.81)
RDW: 20.2 % — ABNORMAL HIGH (ref 11.5–15.5)
WBC: 10.7 10*3/uL — ABNORMAL HIGH (ref 4.0–10.5)
nRBC: 0.2 % (ref 0.0–0.2)

## 2021-02-20 NOTE — Progress Notes (Signed)
[facility] Progress Note   Subjective:    Seen and examined patient at bedside. No changes. Plan for HD 02/21/21. Awaiting new SNF placement.  Objective Vitals:   02/20/21 0459 02/20/21 0553 02/20/21 0809 02/20/21 1152  BP:  91/60 94/63 100/86  Pulse: (!) 118 (!) 117 (!) 119 (!) 120  Resp: '20 20 20 19  '$ Temp:  98.6 F (37 C) 97.6 F (36.4 C) 98.5 F (36.9 C)  TempSrc:  Axillary Skin Oral  SpO2: 100% 96% 92% 94%  Weight:      Height:       Physical Exam General: Chronically ill-appearing male; NAD Heart: Normal S1 and S2; No murmurs, rubs, and gallops Lungs: Cleart throughout; No wheezing, rales, or rhonchi Abdomen: No ABD edema, soft, non-tender, (+) BS Extremities: No edema BLLE-wound vac to RLE in place-no drainage noted. Dialysis Access: L IJ [facility]   Filed Weights   02/18/21 1130 02/19/21 0458 02/20/21 0326  Weight: 102.4 kg 104.1 kg 106.4 kg    Intake/Output Summary (Last 24 hours) at 02/20/2021 1331 Last data filed at 02/20/2021 0843 Gross per 24 hour  Intake 580 ml  Output --  Net 580 ml    Additional Objective Labs: Basic Metabolic Panel: Recent Labs  Lab 02/24/2021 0047 02/18/21 0833 02/18/21 1350  NA 134* 135 134*  K 3.4* 3.7 3.3*  CL 102 101 97*  CO2 19* 22 26  GLUCOSE 110* 156* 109*  BUN 33* 46* 19  CREATININE 4.85* 6.31* 3.33*  CALCIUM 7.7* 7.9* 7.8*  PHOS 3.9 6.3* 3.1   Liver Function Tests: Recent Labs  Lab 02/11/2021 0047 02/18/21 0833 02/18/21 1350  ALBUMIN 1.8* 1.8* 2.0*   No results for input(s): LIPASE, AMYLASE in the last 168 hours. CBC: Recent Labs  Lab 02/08/2021 1439 02/18/21 0834 02/18/21 1350 02/19/21 0043 02/20/21 0104  WBC 12.0* 12.8* 14.5* 11.7* 10.7*  HGB 8.1* 7.4* 8.4* 7.5* 7.9*  HCT 28.0* 25.9* 27.9* 25.5* 25.6*  MCV 95.6 95.2 93.9 93.4 93.1  PLT 263 233 229 228 186   Blood Culture    Component Value Date/Time   SDES BLOOD RIGHT ANTECUBITAL 02/11/2021 1110   SPECREQUEST  02/24/2021 1110     BOTTLES DRAWN AEROBIC ONLY Blood Culture adequate volume   CULT  03/05/2021 1110    NO GROWTH 5 DAYS Performed at [facility], [place] [address], [place], [place] [address]    REPTSTATUS 02/12/2021 FINAL 02/16/2021 1110    Cardiac Enzymes: No results for input(s): CKTOTAL, CKMB, CKMBINDEX, TROPONINI in the last 168 hours. CBG: Recent Labs  Lab 02/19/21 1145 02/19/21 1614 02/19/21 2037 02/20/21 0813 02/20/21 1155  GLUCAP 130* 154* 161* 203* 154*   Iron Studies: No results for input(s): IRON, TIBC, TRANSFERRIN, FERRITIN in the last 72 hours. Lab Results  Component Value Date   INR 1.9 (H) 03/07/2021   INR 2.6 (H) 01/18/2021   INR 1.5 (H) 12/04/2020   Studies/Results: No results found.  Medications:  sodium chloride     sodium chloride     sodium chloride     sodium chloride     [START ON 02/21/2021] ceFEPime (MAXIPIME) IV      (feeding supplement) PROSource Plus  30 mL Oral BID BM   amiodarone  200 mg Oral BID   apixaban  5 mg Oral BID   atorvastatin  40 mg Oral QPM   chlorhexidine  15 mL Mouth Rinse BID   Chlorhexidine Gluconate Cloth  6 each Topical Q0600  Chlorhexidine Gluconate Cloth  6 each Topical Q0600   cholestyramine light  4 g Oral BID   collagenase   Topical Daily   [START ON 02/23/2021] darbepoetin (ARANESP) injection - DIALYSIS  200 mcg Intravenous Q Wed-HD   docusate sodium  100 mg Oral BID   fentaNYL  1 patch Transdermal Q72H   ferric citrate  420 mg Oral BID WC   Gerhardt's butt cream   Topical TID   insulin aspart  0-5 Units Subcutaneous QHS   insulin aspart  0-9 Units Subcutaneous TID WC   insulin glargine-yfgn  5 Units Subcutaneous QHS   latanoprost  1 drop Both Eyes QHS   levothyroxine  25 mcg Oral QAC breakfast   mouth rinse  15 mL Mouth Rinse q12n4p   metoprolol succinate  12.5 mg Oral Daily   midodrine  10 mg Oral TID WC   multivitamin  1 tablet Oral QHS   mupirocin ointment  1 application Nasal BID   pantoprazole  40 mg Oral Q0600    povidone-iodine  2 application Topical Once   pregabalin  25 mg Oral BID   saccharomyces boulardii  250 mg Oral BID    Dialysis Orders: [facility]: MWF 4 hrs F250 450/800 3.0K/2.5 Ca LIJ TDC  EDW 111 kg -Heparin 4900 units IV initial bolus, 500 units hourly, DC last hour of tx  Assessment/Plan: R. Thigh Abscess: Continue cepepime, per ID: d/c vanc. To OR this AM for I & D. Now with wound Vac. Per primary.   ESRD - On HD MWF. Had hypotensive episodes during yesterday's HD-on Midodrine. Minimal net UF tolerated. Can give extra Midodrine dose mid-treatment if needed. Bps soft but stable currently. Next HD 02/21/2021. Hypertension/volume-Have attempted to challenge and lower EDW since admit. Nadir wt 101.5 kg 02/12/21. Using Midodrine with extra 10 mg PO mid run to offset intradialyic hypotension. Continue lowering volume as tolerated. Anemia  - HGB variable. From 8/12: 7.4-8.4-now trending upward slowly-7.9 from 02/20/21). Aranesp dose recently raised for next HD. Give blood transfusion if needed with HD. Follow trends. Metabolic bone disease -  C Ca 9.96, VDRA is on hold. PO4 trending up-now 6.3. DC 'd Calcium acetate d/t intolerance-recently raised auryxia to 2 tab PO BID-PO4 now 3.1. Nutrition - Albumin 2.5  Renal/Carb mod diet with protein supps, renal vits. DMT2-per primary Hypothyroidism-per primary AFIB: On amiodarone, apixaban and low-dose metoprolol and was scheduled for cardioversion but this has not happened yet. Seen by Dr. Angelena Form 02/16/2021. Needs uninterrupted anticoagulation for 3 weeks then DCCV.   9. Disposition: Does NOT wish to return to  previous SNF. Wanting SNF closer to his home in [place]. Awaiting new SNF placement.  Tobie Poet, NP [facility] 02/20/2021,1:31 PM  LOS: 13 days

## 2021-02-20 NOTE — Progress Notes (Signed)
ENT follow-up  Oral closure is intact without any signs of infection.  There is no submandibular swelling.  Progressing nicely.  Reconsult if needed.

## 2021-02-20 NOTE — Progress Notes (Signed)
PROGRESS NOTE    Kyle Walton  [id number] DOB: 01-06-1956 DOA: 02/27/2021 PCP: Patient, No Pcp Per (Inactive)   Brief Narrative: 60 chronically ill with ESRD, anemia of chronic disease, type 2 diabetes, chronic diastolic heart failure, paroxysmal A. fib on Eliquis, COPD chronic hypoxic respiratory failure on 2 L of oxygen, nocturnal BiPAP, hypothyroidism recently discharged from [facility] 7/26: Treated for right thigh abscess, underwent I&D on 7/14.  Completed 7 days of IV therapy inpatient and discharged to SNF on oral ciprofloxacin, which he  may not have started.  Was seen by the wound nurse 8/1, noted to have increasing swelling tenderness and drainage from the wound, subsequently sent to the ED.  In the emergency room was briefly hypotensive, subsequently stabilized, started on broad-spectrum antibiotics, Ortho consulted, initially recommended antibiotics.  Treated with broad-spectrum antibiotics. Underwent incision  and drainage of wound 8/11.   Assessment & Plan:   Active Problems:   CAD (coronary artery disease)   Diabetes mellitus with peripheral vascular disease ([place])   ESRD on hemodialysis ([place])   Acquired hypothyroidism   Open thigh wound, right, sequela   Wound infection     Pressure Injury 12/11/20 Sacrum Right;Left;Mid Stage 2 -  Partial thickness loss of dermis presenting as a shallow open injury with a red, pink wound bed without slough. (Active)  12/11/20 0551  Location: Sacrum  Location Orientation: Right;Left;Mid  Staging: Stage 2 -  Partial thickness loss of dermis presenting as a shallow open injury with a red, pink wound bed without slough.  Wound Description (Comments):   Present on Admission:     1-Right Thigh Abscess:  -Recent I and D on 7/14, culture grew Serratia resistant to Ancef. (Prior admission)  -Admitted with clinical worsening wound, CT concern for developing compartment syndrome, substantially reduced volume of the mixed density  collection right Vastus Lateralis. Locules of subcutaneous gas density track along approximately 13 cm.  -Appreciate orthopedic follow-up, sutures removed to allow drainage. -Underwent I and D of wound and application of negative pressure wound dressing. 8/11. -plan for two weeks Cefepime with HD End date 03/03/2021. Per ID recommendations.  -Wound Vac in place. Follow Ortho recommendation. Wound vac need to remain for 5 days post op,  WBC trending down.   2-ESRD on hemodialysis: Dialysis TTS.  Diabetes type 2: Continue with Lantus.  Chronic hypotension: Continue with midodrine  Chronic Diastolic heart failure: Volume managed with hemodialysis.  CAD/CABG: Beta-blocker and statin Resume plavix   Paroxysmal A. fib: Continue with Toprol amiodarone Ok to resume Eliquis, discussed with Legrand Como PA with ortho.  Appreciate cardiology/  HR in the 120, nurse will page cardiology.   COPD/chronic respiratory failure: Continue with BiPAP nightly  History of PAD: Prior to amputation and metatarsal amputation.  Continue with statin and Plavix on hold  Anemia of chronic disease: Continue with Epogen with hemodialysis. Hb down to 7.5--7.9 On Aranesp.   Loose stool; started florastore.  Estimated body mass index is 33.66 kg/m as calculated from the following:   Height as of this encounter: '5\' 10"'$  (1.778 m).   Weight as of this encounter: 106.4 kg.   DVT prophylaxis: SCD Code Status: Full Code Family Communication: care discussed with patient.  Disposition Plan:  Status is: Inpatient  Remains inpatient appropriate because:IV treatments appropriate due to intensity of illness or inability to take PO  Dispo: The patient is from: SNF              Anticipated d/c is to: SNF  Patient currently is not medically stable to d/c.   Difficult to place patient No        Consultants:  Ortho Nephrology Cardiology  Procedures:  I and D   Antimicrobials:     Subjective: Pain is controlled.  He was upset on my arrival to room, didn't want initially to answer orientation questions.  BP was low but was probably not accurate. Repeated BP was in high 90.  I came and check on him later, he was doing , feeling better. More calm. Wife was at bedside, she is very frustrated with care patient received at [facility], he lost his cell phone and CIPAP machine there.   Objective: Vitals:   02/20/21 0459 02/20/21 0553 02/20/21 0809 02/20/21 1152  BP:  91/60 94/63 100/86  Pulse: (!) 118 (!) 117 (!) 119 (!) 120  Resp: '20 20 20 19  '$ Temp:  98.6 F (37 C) 97.6 F (36.4 C) 98.5 F (36.9 C)  TempSrc:  Axillary Skin Oral  SpO2: 100% 96% 92% 94%  Weight:      Height:        Intake/Output Summary (Last 24 hours) at 02/20/2021 1254 Last data filed at 02/20/2021 0843 Gross per 24 hour  Intake 820 ml  Output --  Net 820 ml    Filed Weights   02/18/21 1130 02/19/21 0458 02/20/21 0326  Weight: 102.4 kg 104.1 kg 106.4 kg    Examination:  General exam: NAD Respiratory system: CTA Cardiovascular system: S 1, S 2 RRR Gastrointestinal system: BS present, soft, nt Central nervous system: ALert Extremities: Right thigh with wound vac    Data Reviewed: I have personally reviewed following labs and imaging studies  CBC: Recent Labs  Lab 02/28/2021 1439 02/18/21 0834 02/18/21 1350 02/19/21 0043 02/20/21 0104  WBC 12.0* 12.8* 14.5* 11.7* 10.7*  HGB 8.1* 7.4* 8.4* 7.5* 7.9*  HCT 28.0* 25.9* 27.9* 25.5* 25.6*  MCV 95.6 95.2 93.9 93.4 93.1  PLT 263 233 229 228 [id number]    Basic Metabolic Panel: Recent Labs  Lab 02/15/21 0134 02/16/21 0242 02/28/2021 0047 02/18/21 0833 02/18/21 1350  NA 132* 134* 134* 135 134*  K 4.5 3.5 3.4* 3.7 3.3*  CL 98 100 102 101 97*  CO2 23 25 19* 22 26  GLUCOSE 94 147* 110* 156* 109*  BUN 42* 24* 33* 46* 19  CREATININE 5.38* 3.77* 4.85* 6.31* 3.33*  CALCIUM 8.3* 7.8* 7.7* 7.9* 7.8*  PHOS  --   --  3.9 6.3* 3.1     GFR: Estimated Creatinine Clearance: 27 mL/min (A) (by C-G formula based on SCr of 3.33 mg/dL (H)). Liver Function Tests: Recent Labs  Lab 02/22/2021 0047 02/18/21 0833 02/18/21 1350  ALBUMIN 1.8* 1.8* 2.0*    No results for input(s): LIPASE, AMYLASE in the last 168 hours. No results for input(s): AMMONIA in the last 168 hours. Coagulation Profile: No results for input(s): INR, PROTIME in the last 168 hours. Cardiac Enzymes: No results for input(s): CKTOTAL, CKMB, CKMBINDEX, TROPONINI in the last 168 hours. BNP (last 3 results) No results for input(s): PROBNP in the last 8760 hours. HbA1C: No results for input(s): HGBA1C in the last 72 hours. CBG: Recent Labs  Lab 02/19/21 1145 02/19/21 1614 02/19/21 2037 02/20/21 0813 02/20/21 1155  GLUCAP 130* 154* 161* 203* 154*    Lipid Profile: No results for input(s): CHOL, HDL, LDLCALC, TRIG, CHOLHDL, LDLDIRECT in the last 72 hours. Thyroid Function Tests: No results for input(s): TSH, T4TOTAL, FREET4, T3FREE, THYROIDAB in the  last 72 hours. Anemia Panel: No results for input(s): VITAMINB12, FOLATE, FERRITIN, TIBC, IRON, RETICCTPCT in the last 72 hours. Sepsis Labs: No results for input(s): PROCALCITON, LATICACIDVEN in the last 168 hours.  Recent Results (from the past 240 hour(s))  Surgical PCR screen     Status: Abnormal   Collection Time: 02/16/21 11:19 PM   Specimen: Nasal Mucosa; Nasal Swab  Result Value Ref Range Status   MRSA, PCR NEGATIVE NEGATIVE Final   Staphylococcus aureus POSITIVE (A) NEGATIVE Final    Comment: (NOTE) The Xpert SA Assay (FDA approved for NASAL specimens in patients 62 years of age and older), is one component of a comprehensive surveillance program. It is not intended to diagnose infection nor to guide or monitor treatment. Performed at [facility], [place] [address], [place], [place] [address]           Radiology Studies: No results found.      Scheduled Meds:   (feeding supplement) PROSource Plus  30 mL Oral BID BM   amiodarone  200 mg Oral BID   apixaban  5 mg Oral BID   atorvastatin  40 mg Oral QPM   chlorhexidine  15 mL Mouth Rinse BID   Chlorhexidine Gluconate Cloth  6 each Topical Q0600   Chlorhexidine Gluconate Cloth  6 each Topical Q0600   cholestyramine light  4 g Oral BID   collagenase   Topical Daily   [START ON 02/23/2021] darbepoetin (ARANESP) injection - DIALYSIS  200 mcg Intravenous Q Wed-HD   docusate sodium  100 mg Oral BID   fentaNYL  1 patch Transdermal Q72H   ferric citrate  420 mg Oral BID WC   Gerhardt's butt cream   Topical TID   insulin aspart  0-5 Units Subcutaneous QHS   insulin aspart  0-9 Units Subcutaneous TID WC   insulin glargine-yfgn  5 Units Subcutaneous QHS   latanoprost  1 drop Both Eyes QHS   levothyroxine  25 mcg Oral QAC breakfast   mouth rinse  15 mL Mouth Rinse q12n4p   metoprolol succinate  12.5 mg Oral Daily   midodrine  10 mg Oral TID WC   multivitamin  1 tablet Oral QHS   mupirocin ointment  1 application Nasal BID   pantoprazole  40 mg Oral Q0600   povidone-iodine  2 application Topical Once   pregabalin  25 mg Oral BID   saccharomyces boulardii  250 mg Oral BID   Continuous Infusions:  sodium chloride     sodium chloride     sodium chloride     sodium chloride     [START ON 02/21/2021] ceFEPime (MAXIPIME) IV       LOS: 13 days    Time spent: 35  minutes.     Elmarie Shiley, MD [facility]   If 7PM-7AM, please contact night-coverage [email or web address]  02/20/2021, 12:54 PM

## 2021-02-21 ENCOUNTER — Inpatient Hospital Stay (HOSPITAL_COMMUNITY): Payer: Medicare HMO

## 2021-02-21 DIAGNOSIS — I484 Atypical atrial flutter: Secondary | ICD-10-CM

## 2021-02-21 DIAGNOSIS — R9431 Abnormal electrocardiogram [ECG] [EKG]: Secondary | ICD-10-CM | POA: Diagnosis not present

## 2021-02-21 DIAGNOSIS — I9589 Other hypotension: Secondary | ICD-10-CM | POA: Diagnosis not present

## 2021-02-21 DIAGNOSIS — E1151 Type 2 diabetes mellitus with diabetic peripheral angiopathy without gangrene: Secondary | ICD-10-CM | POA: Diagnosis not present

## 2021-02-21 DIAGNOSIS — I2510 Atherosclerotic heart disease of native coronary artery without angina pectoris: Secondary | ICD-10-CM | POA: Diagnosis not present

## 2021-02-21 LAB — CBC
HCT: 24.9 % — ABNORMAL LOW (ref 39.0–52.0)
Hemoglobin: 7.4 g/dL — ABNORMAL LOW (ref 13.0–17.0)
MCH: 27.7 pg (ref 26.0–34.0)
MCHC: 29.7 g/dL — ABNORMAL LOW (ref 30.0–36.0)
MCV: 93.3 fL (ref 80.0–100.0)
Platelets: 211 10*3/uL (ref 150–400)
RBC: 2.67 MIL/uL — ABNORMAL LOW (ref 4.22–5.81)
RDW: 19.9 % — ABNORMAL HIGH (ref 11.5–15.5)
WBC: 13.8 10*3/uL — ABNORMAL HIGH (ref 4.0–10.5)
nRBC: 0.1 % (ref 0.0–0.2)

## 2021-02-21 LAB — GLUCOSE, CAPILLARY
Glucose-Capillary: 131 mg/dL — ABNORMAL HIGH (ref 70–99)
Glucose-Capillary: 180 mg/dL — ABNORMAL HIGH (ref 70–99)
Glucose-Capillary: 142 mg/dL — ABNORMAL HIGH (ref 70–99)
Glucose-Capillary: 258 mg/dL — ABNORMAL HIGH (ref 70–99)

## 2021-02-21 LAB — ECHOCARDIOGRAM COMPLETE
AR max vel: 1.83 cm2
AV Area VTI: 1.84 cm2
AV Area mean vel: 1.68 cm2
AV Mean grad: 5 mmHg
AV Peak grad: 8.2 mmHg
Ao pk vel: 1.43 m/s
Area-P 1/2: 4.01 cm2
Height: 70 in
S' Lateral: 2.7 cm
Weight: 3753.11 oz

## 2021-02-21 LAB — RENAL FUNCTION PANEL
Albumin: 1.7 g/dL — ABNORMAL LOW (ref 3.5–5.0)
Anion gap: 14 (ref 5–15)
BUN: 56 mg/dL — ABNORMAL HIGH (ref 8–23)
CO2: 18 mmol/L — ABNORMAL LOW (ref 22–32)
Calcium: 7.6 mg/dL — ABNORMAL LOW (ref 8.9–10.3)
Chloride: 102 mmol/L (ref 98–111)
Creatinine, Ser: 7.21 mg/dL — ABNORMAL HIGH (ref 0.61–1.24)
GFR, Estimated: 8 mL/min — ABNORMAL LOW (ref >60)
Glucose, Bld: 166 mg/dL — ABNORMAL HIGH (ref 70–99)
Phosphorus: 6.5 mg/dL — ABNORMAL HIGH (ref 2.5–4.6)
Potassium: 3.7 mmol/L (ref 3.5–5.1)
Sodium: 134 mmol/L — ABNORMAL LOW (ref 135–145)

## 2021-02-21 LAB — ABO/RH: ABO/RH(D): B POS

## 2021-02-21 LAB — LACTIC ACID, PLASMA: Lactic Acid, Venous: 1.1 mmol/L (ref 0.5–1.9)

## 2021-02-21 LAB — PREPARE RBC (CROSSMATCH)

## 2021-02-21 MED ORDER — CLOPIDOGREL BISULFATE 75 MG PO TABS
75.0000 mg | ORAL_TABLET | Freq: Every day | ORAL | Status: DC
  Administered 2021-02-21 – 2021-03-01 (×9): 75 mg via ORAL
  Filled 2021-02-21 (×9): qty 1

## 2021-02-21 MED ORDER — LACTATED RINGERS IV BOLUS: 250.0000 mL | Freq: Once | INTRAVENOUS | Status: DC

## 2021-02-21 MED ORDER — PERFLUTREN LIPID MICROSPHERE
1.0000 mL | INTRAVENOUS | Status: AC | PRN
  Administered 2021-02-21: 1.25 mL via INTRAVENOUS
  Filled 2021-02-21: qty 10

## 2021-02-21 MED ORDER — SODIUM CHLORIDE 0.9% IV SOLUTION: Freq: Once | INTRAVENOUS | Status: AC

## 2021-02-21 NOTE — TOC Progression Note (Signed)
Transition of Care [facility]) - Progression Note    Patient Details  Name: Graylon Amie MRN: [id number] Date of Birth: 1956-04-07  Transition of Care [facility]) CM/SW Contact  Bartholomew Crews, RN Phone Number: [phone] 02/21/2021, 9:45 AM  Clinical Narrative:     Received TOC consult to identify if LTAC is an option. Spoke with liaison on [facility] to review if patient may be - review pending.  Expected Discharge Plan: Skilled Nursing Facility Barriers to Discharge: Ship broker, Continued Medical Work up  Expected Discharge Plan and Services Expected Discharge Plan: [place] In-house Referral: Clinical Social Work   Post Acute Care Choice: [place] Living arrangements for the past 2 months: [place]                                       Social Determinants of Health (SDOH) Interventions    Readmission Risk Interventions Readmission Risk Prevention Plan 02/16/2021 01/28/2021  Transportation Screening Complete Complete  Medication Review (RN Care Manager) Referral to Pharmacy Referral to Pharmacy  PCP or Specialist appointment within 3-5 days of discharge Complete Complete  HRI or Home Care Consult Complete Complete  SW Recovery Care/Counseling Consult Complete Complete  Palliative Care Screening Not Applicable Not Applicable  Skilled Nursing Facility Complete Complete

## 2021-02-21 NOTE — Progress Notes (Signed)
   02/21/21 1925  Assess: MEWS Score  Temp 98.9 F (37.2 C)  BP 98/73  Pulse Rate (!) 117  Resp 18  Level of Consciousness Alert  SpO2 99 %  O2 Device Nasal Cannula  O2 Flow Rate (L/min) 2 L/min  Assess: MEWS Score  MEWS Temp 0  MEWS Systolic 1  MEWS Pulse 2  MEWS RR 0  MEWS LOC 0  MEWS Score 3  MEWS Score Color Yellow  Assess: if the MEWS score is Yellow or Red  Were vital signs taken at a resting state? Yes  Focused Assessment No change from prior assessment  Early Detection of Sepsis Score *See Row Information* Medium  MEWS guidelines implemented *See Row Information* No, vital signs rechecked  Treat  MEWS Interventions Administered scheduled meds/treatments  Pain Scale 0-10  Pain Score 0  Take Vital Signs  Increase Vital Sign Frequency  Yellow: Q 2hr X 2 then Q 4hr X 2, if remains yellow, continue Q 4hrs  Escalate  MEWS: Escalate Yellow: discuss with charge nurse/RN and consider discussing with provider and RRT  Notify: Charge Nurse/RN  Name of Charge Nurse/RN Notified Surveyor, quantity  Date Charge Nurse/RN Notified 02/21/21  Time Charge Nurse/RN Notified 2000  Notify: Provider  Provider Name/Title On-call MD  Date Provider Notified 02/21/21  Time Provider Notified 2000  Notification Type Page  Notification Reason Other (Comment) (Yellow MEWS)  Provider response No new orders  Document  Patient Outcome Other (Comment) (Stable)  Progress note created (see row info) Yes

## 2021-02-21 NOTE — Progress Notes (Signed)
Pharmacy Antibiotic Note - follow up  Kyle Walton is a 65 y.o. male admitted on 03/08/2021 with recurrent R thigh abscess/pyomyositis s/p I&D 8/11. No cultures sent. Plan 14d of cefepime per ID. Of note, patient is on dialysis.  Pharmacy has been consulted for cefepime dosing.  Plan: Cefepime 2g post-HD Q MWF F/u HD schedule changes End date 8/25 entered   Height: '5\' 10"'$  (177.8 cm) Weight: 106.4 kg (234 lb 9.1 oz) IBW/kg (Calculated) : 73  Temp (24hrs), Avg:98 F (36.7 C), Min:97.5 F (36.4 C), Max:98.5 F (36.9 C)  Recent Labs  Lab 02/15/21 0134 02/16/21 0242 02/21/2021 0047 03/08/2021 1439 02/18/21 0833 02/18/21 0834 02/18/21 1350 02/19/21 0043 02/20/21 0104  WBC 11.9* 11.9*  --  12.0*  --  12.8* 14.5* 11.7* 10.7*  CREATININE 5.38* 3.77* 4.85*  --  6.31*  --  3.33*  --   --      Estimated Creatinine Clearance: 27 mL/min (A) (by C-G formula based on SCr of 3.33 mg/dL (H)).    Allergies  Allergen Reactions   Morphine Other (See Comments)    Per son, [name], pt becomes unresponsive/disoriented- especially when given after HD tx AND "ALLERGIC," per MAR   Liraglutide Diarrhea and Other (See Comments)    "Phenol"...."ALLERGIC," per [facility]    Antimicrobials Vanc 8/1>>8/11 Cefepime 8/1>>8/25  Microbiology  8/10 MRSA nasal PCR: + 8/1 Bcx: ngtd 7/14: R thigh: Serratia R to Ancef 7/14 MRSA PCR: positive,  SA: positive  Thank you for allowing pharmacy to be a part of this patient's care.  Benetta Spar, PharmD, BCPS, BCCP Clinical Pharmacist  Please check AMION for all [place] phone numbers After 10:00 PM, call [place] [phone]

## 2021-02-21 NOTE — Progress Notes (Signed)
PROGRESS NOTE    Kyle Walton  [id number] DOB: 1956-06-06 DOA: 02/20/2021 PCP: Patient, No Pcp Per (Inactive)   Brief Narrative: 65 chronically ill with ESRD, anemia of chronic disease, type 2 diabetes, chronic diastolic heart failure, paroxysmal A. fib on Eliquis, COPD chronic hypoxic respiratory failure on 2 L of oxygen, nocturnal BiPAP, hypothyroidism recently discharged from [facility] 7/26: Treated for right thigh abscess, underwent I&D on 7/14.  Completed 7 days of IV therapy inpatient and discharged to SNF on oral ciprofloxacin, which he  may not have started.  Was seen by the wound nurse 8/1, noted to have increasing swelling tenderness and drainage from the wound, subsequently sent to the ED.  In the emergency room was briefly hypotensive, subsequently stabilized, started on broad-spectrum antibiotics, Ortho consulted, initially recommended antibiotics.  Treated with broad-spectrum antibiotics. Underwent incision  and drainage of wound 8/11.   Assessment & Plan:   Active Problems:   CAD (coronary artery disease)   Diabetes mellitus with peripheral vascular disease ([place])   ESRD on hemodialysis ([place])   Acquired hypothyroidism   Open thigh wound, right, sequela   Wound infection     Pressure Injury 12/11/20 Sacrum Right;Left;Mid Stage 2 -  Partial thickness loss of dermis presenting as a shallow open injury with a red, pink wound bed without slough. (Active)  12/11/20 0551  Location: Sacrum  Location Orientation: Right;Left;Mid  Staging: Stage 2 -  Partial thickness loss of dermis presenting as a shallow open injury with a red, pink wound bed without slough.  Wound Description (Comments):   Present on Admission:     1-Right Thigh Abscess:  -Recent I and D on 7/14, culture grew Serratia resistant to Ancef. (Prior admission)  -Admitted with clinical worsening wound, CT concern for developing compartment syndrome, substantially reduced volume of the mixed density  collection right Vastus Lateralis. Locules of subcutaneous gas density track along approximately 13 cm.  -Appreciate orthopedic follow-up, sutures removed to allow drainage. -Underwent I and D of wound and application of negative pressure wound dressing. 8/11. -plan for two weeks Cefepime with HD End date 03/03/2021. Per ID recommendations.  -Wound Vac in place. Follow Ortho recommendation. Wound vac need to remain for 5 days post op. WBC trending down.   2-ESRD on hemodialysis: Dialysis TTS.  Diabetes type 2: Continue with Lantus.  Chronic hypotension: Continue with midodrine. BP soft today. Repeated lactic acid normal.  HB 7.4, will discuss with nephrology for Blood transfusion with HD.   Chronic Diastolic heart failure: Volume managed with hemodialysis.  CAD/CABG: Beta-blocker and statin Resume plavix   Paroxysmal A. fib: Continue with Toprol amiodarone Ok to resume Eliquis, discussed with Legrand Como PA with ortho.  Appreciate cardiology/  HR in the 120, nurse will page cardiology.   COPD/chronic respiratory failure: Continue with BiPAP nightly  History of PAD: Prior to amputation and metatarsal amputation.  Continue with statin and Plavix .  Anemia of chronic disease: Continue with Epogen with hemodialysis. Hb down to 7.5--7.9 7.4, will benefit from blood transfusion.  On Aranesp.   Loose stool; started florastore.  Estimated body mass index is 33.66 kg/m as calculated from the following:   Height as of this encounter: '5\' 10"'$  (1.778 m).   Weight as of this encounter: 106.4 kg.   DVT prophylaxis: SCD Code Status: Full Code Family Communication: care discussed with patient. Wife 8/14 Disposition Plan:  Status is: Inpatient  Remains inpatient appropriate because:IV treatments appropriate due to intensity of illness or inability to take  PO  Dispo: The patient is from: SNF              Anticipated d/c is to: SNF              Patient currently is not medically stable to  d/c.   Difficult to place patient No        Consultants:  Ortho Nephrology Cardiology  Procedures:  I and D   Antimicrobials:    Subjective: He is is more calm today. Denies pain.  He is alert, answer questions.   Objective: Vitals:   02/21/21 0310 02/21/21 0400 02/21/21 0823 02/21/21 1218  BP: (!) 95/49 97/68 (!) 82/49 (!) 86/48  Pulse: (!) 101 95 93 94  Resp:  '19 14 19  '$ Temp: 98.1 F (36.7 C) 98.1 F (36.7 C) 98.5 F (36.9 C) 98 F (36.7 C)  TempSrc: Oral Oral Oral Oral  SpO2: 95% 100% 100% 100%  Weight:      Height:       No intake or output data in the 24 hours ending 02/21/21 1427  Filed Weights   02/18/21 1130 02/19/21 0458 02/20/21 0326  Weight: 102.4 kg 104.1 kg 106.4 kg    Examination:  General exam: NAD Respiratory system: CTA Cardiovascular system: S 1, S 2 RRR Gastrointestinal system: BS present, soft, nt Central nervous system:  Alert Extremities: Right thigh with wound vac    Data Reviewed: I have personally reviewed following labs and imaging studies  CBC: Recent Labs  Lab 02/18/21 0834 02/18/21 1350 02/19/21 0043 02/20/21 0104 02/21/21 1237  WBC 12.8* 14.5* 11.7* 10.7* 13.8*  HGB 7.4* 8.4* 7.5* 7.9* 7.4*  HCT 25.9* 27.9* 25.5* 25.6* 24.9*  MCV 95.2 93.9 93.4 93.1 93.3  PLT 233 229 228 186 [id number]    Basic Metabolic Panel: Recent Labs  Lab 02/16/21 0242 02/15/2021 0047 02/18/21 0833 02/18/21 1350 02/21/21 1237  NA 134* 134* 135 134* 134*  K 3.5 3.4* 3.7 3.3* 3.7  CL 100 102 101 97* 102  CO2 25 19* 22 26 18*  GLUCOSE 147* 110* 156* 109* 166*  BUN 24* 33* 46* 19 56*  CREATININE 3.77* 4.85* 6.31* 3.33* 7.21*  CALCIUM 7.8* 7.7* 7.9* 7.8* 7.6*  PHOS  --  3.9 6.3* 3.1 6.5*    GFR: Estimated Creatinine Clearance: 12.5 mL/min (A) (by C-G formula based on SCr of 7.21 mg/dL (H)). Liver Function Tests: Recent Labs  Lab 02/20/2021 0047 02/18/21 0833 02/18/21 1350 02/21/21 1237  ALBUMIN 1.8* 1.8* 2.0* 1.7*    No  results for input(s): LIPASE, AMYLASE in the last 168 hours. No results for input(s): AMMONIA in the last 168 hours. Coagulation Profile: No results for input(s): INR, PROTIME in the last 168 hours. Cardiac Enzymes: No results for input(s): CKTOTAL, CKMB, CKMBINDEX, TROPONINI in the last 168 hours. BNP (last 3 results) No results for input(s): PROBNP in the last 8760 hours. HbA1C: No results for input(s): HGBA1C in the last 72 hours. CBG: Recent Labs  Lab 02/20/21 1155 02/20/21 1613 02/20/21 2053 02/21/21 0826 02/21/21 1223  GLUCAP 154* 271* 260* 131* 180*    Lipid Profile: No results for input(s): CHOL, HDL, LDLCALC, TRIG, CHOLHDL, LDLDIRECT in the last 72 hours. Thyroid Function Tests: No results for input(s): TSH, T4TOTAL, FREET4, T3FREE, THYROIDAB in the last 72 hours. Anemia Panel: No results for input(s): VITAMINB12, FOLATE, FERRITIN, TIBC, IRON, RETICCTPCT in the last 72 hours. Sepsis Labs: Recent Labs  Lab 02/21/21 1237  LATICACIDVEN 1.1    Recent  Results (from the past 240 hour(s))  Surgical PCR screen     Status: Abnormal   Collection Time: 02/16/21 11:19 PM   Specimen: Nasal Mucosa; Nasal Swab  Result Value Ref Range Status   MRSA, PCR NEGATIVE NEGATIVE Final   Staphylococcus aureus POSITIVE (A) NEGATIVE Final    Comment: (NOTE) The Xpert SA Assay (FDA approved for NASAL specimens in patients 22 years of age and older), is one component of a comprehensive surveillance program. It is not intended to diagnose infection nor to guide or monitor treatment. Performed at [facility], [place] [address], [place], [place] [address]           Radiology Studies: No results found.      Scheduled Meds:  (feeding supplement) PROSource Plus  30 mL Oral BID BM   amiodarone  200 mg Oral BID   apixaban  5 mg Oral BID   atorvastatin  40 mg Oral QPM   chlorhexidine  15 mL Mouth Rinse BID   Chlorhexidine Gluconate Cloth  6 each Topical Q0600    cholestyramine light  4 g Oral BID   clopidogrel  75 mg Oral Daily   [START ON 02/23/2021] darbepoetin (ARANESP) injection - DIALYSIS  200 mcg Intravenous Q Wed-HD   docusate sodium  100 mg Oral BID   fentaNYL  1 patch Transdermal Q72H   ferric citrate  420 mg Oral BID WC   Gerhardt's butt cream   Topical TID   insulin aspart  0-5 Units Subcutaneous QHS   insulin aspart  0-9 Units Subcutaneous TID WC   insulin glargine-yfgn  5 Units Subcutaneous QHS   latanoprost  1 drop Both Eyes QHS   levothyroxine  25 mcg Oral QAC breakfast   mouth rinse  15 mL Mouth Rinse q12n4p   metoprolol succinate  12.5 mg Oral Daily   midodrine  10 mg Oral TID WC   multivitamin  1 tablet Oral QHS   mupirocin ointment  1 application Nasal BID   pantoprazole  40 mg Oral Q0600   povidone-iodine  2 application Topical Once   pregabalin  25 mg Oral BID   saccharomyces boulardii  250 mg Oral BID   Continuous Infusions:  sodium chloride     sodium chloride     ceFEPime (MAXIPIME) IV     lactated ringers       LOS: 14 days    Time spent: 35  minutes.     Elmarie Shiley, MD [facility]   If 7PM-7AM, please contact night-coverage [email or web address]  02/21/2021, 2:27 PM

## 2021-02-21 NOTE — Progress Notes (Signed)
Kyle Walton Progress Note   Subjective:    Seen in room. No complaints, eating breakfast. For dialysis today.   Objective Vitals:   02/21/21 0022 02/21/21 0310 02/21/21 0400 02/21/21 0823  BP: 94/81 (!) 95/49 97/68 (!) 82/49  Pulse: 93 (!) 101 95 93  Resp:   19 14  Temp:  98.1 F (36.7 C) 98.1 F (36.7 C) 98.5 F (36.9 C)  TempSrc:  Oral Oral Oral  SpO2: 100% 95% 100% 100%  Weight:      Height:       Physical Exam General: Chronically ill-appearing male; NAD Heart: Normal S1 and S2; No murmurs, rubs, and gallops Lungs: Cleart throughout; No wheezing, rales, or rhonchi Abdomen: No ABD edema, soft, non-tender, (+) BS Extremities: No edema BLLE-wound vac to RLE in place-no drainage noted. Dialysis Access: L [facility]   Filed Weights   02/18/21 1130 02/19/21 0458 02/20/21 0326  Weight: 102.4 kg 104.1 kg 106.4 kg    Intake/Output Summary (Last 24 hours) at 02/21/2021 1112 Last data filed at 02/20/2021 1300 Gross per 24 hour  Intake 240 ml  Output --  Net 240 ml     Additional Objective Labs: Basic Metabolic Panel: Recent Labs  Lab 02/20/2021 0047 02/18/21 0833 02/18/21 1350  NA 134* 135 134*  K 3.4* 3.7 3.3*  CL 102 101 97*  CO2 19* 22 26  GLUCOSE 110* 156* 109*  BUN 33* 46* 19  CREATININE 4.85* 6.31* 3.33*  CALCIUM 7.7* 7.9* 7.8*  PHOS 3.9 6.3* 3.1    Liver Function Tests: Recent Labs  Lab 03/06/2021 0047 02/18/21 0833 02/18/21 1350  ALBUMIN 1.8* 1.8* 2.0*    No results for input(s): LIPASE, AMYLASE in the last 168 hours. CBC: Recent Labs  Lab 03/03/2021 1439 02/18/21 0834 02/18/21 1350 02/19/21 0043 02/20/21 0104  WBC 12.0* 12.8* 14.5* 11.7* 10.7*  HGB 8.1* 7.4* 8.4* 7.5* 7.9*  HCT 28.0* 25.9* 27.9* 25.5* 25.6*  MCV 95.6 95.2 93.9 93.4 93.1  PLT 263 233 229 228 186    Blood Culture    Component Value Date/Time   SDES BLOOD RIGHT ANTECUBITAL 02/28/2021 1110   SPECREQUEST  02/24/2021 1110    BOTTLES DRAWN AEROBIC ONLY Blood  Culture adequate volume   CULT  03/06/2021 1110    NO GROWTH 5 DAYS Performed at [facility], [place] [address] [place], [place] [address]    REPTSTATUS 02/12/2021 FINAL 02/20/2021 1110    Cardiac Enzymes: No results for input(s): CKTOTAL, CKMB, CKMBINDEX, TROPONINI in the last 168 hours. CBG: Recent Labs  Lab 02/20/21 0813 02/20/21 1155 02/20/21 1613 02/20/21 2053 02/21/21 0826  GLUCAP 203* 154* 271* 260* 131*    Iron Studies: No results for input(s): IRON, TIBC, TRANSFERRIN, FERRITIN in the last 72 hours. Lab Results  Component Value Date   INR 1.9 (H) 02/14/2021   INR 2.6 (H) 01/18/2021   INR 1.5 (H) 12/04/2020   Studies/Results: No results found.  Medications:  sodium chloride     sodium chloride     ceFEPime (MAXIPIME) IV     lactated ringers      (feeding supplement) PROSource Plus  30 mL Oral BID BM   amiodarone  200 mg Oral BID   apixaban  5 mg Oral BID   atorvastatin  40 mg Oral QPM   chlorhexidine  15 mL Mouth Rinse BID   Chlorhexidine Gluconate Cloth  6 each Topical Q0600   cholestyramine light  4 g Oral BID  clopidogrel  75 mg Oral Daily   [START ON 02/23/2021] darbepoetin (ARANESP) injection - DIALYSIS  200 mcg Intravenous Q Wed-HD   docusate sodium  100 mg Oral BID   fentaNYL  1 patch Transdermal Q72H   ferric citrate  420 mg Oral BID WC   Gerhardt's butt cream   Topical TID   insulin aspart  0-5 Units Subcutaneous QHS   insulin aspart  0-9 Units Subcutaneous TID WC   insulin glargine-yfgn  5 Units Subcutaneous QHS   latanoprost  1 drop Both Eyes QHS   levothyroxine  25 mcg Oral QAC breakfast   mouth rinse  15 mL Mouth Rinse q12n4p   metoprolol succinate  12.5 mg Oral Daily   midodrine  10 mg Oral TID WC   multivitamin  1 tablet Oral QHS   mupirocin ointment  1 application Nasal BID   pantoprazole  40 mg Oral Q0600   povidone-iodine  2 application Topical Once   pregabalin  25 mg Oral BID   saccharomyces boulardii  250 mg Oral BID     Dialysis Orders: [facility]: MWF 4 hrs F250 450/800 3.0K/2.5 Ca LIJ TDC  EDW 111 kg -Heparin 4900 units IV initial bolus, 500 units hourly, DC last hour of tx  Assessment/Plan: R. Thigh Abscess: Continue cepepime, per ID: d/c vanc. OR 8/11 for I & D. Now with wound Vac. Per primary.   ESRD - On HD MWF.  Next HD 02/21/2021. Hypertension/volume-Have attempted to challenge and lower EDW since admit. Nadir wt 101.5 kg 02/12/21. Using Midodrine with extra 10 mg PO mid run to offset intradialyic hypotension. Continue lowering volume as tolerated. Anemia  - Hgb 7-8s. Aranesp increased to 200 q Wed starting 8/17.  Metabolic bone disease - Corr Ca/Phos ok. Continue Auryxia 2 tab PO BID   Nutrition - Renal/Carb mod diet with protein supps, renal vits. DMT2-per primary Hypothyroidism-per primary AFIB: On amiodarone, apixaban and low-dose metoprolol and was scheduled for cardioversion but this has not happened yet. Seen by Dr. Angelena Form 02/16/2021. Needs uninterrupted anticoagulation for 3 weeks then DCCV.   9. Disposition: Awaiting new SNF placement.  Kyle Child PA-C [facility] 02/21/2021,11:12 AM

## 2021-02-21 NOTE — Progress Notes (Signed)
PT Cancellation Note  Patient Details Name: Kyle Walton MRN: [id number] DOB: Dec 09, 1955   Cancelled Treatment:    Reason Eval/Treat Not Completed: Patient at procedure or test/unavailable Currently getting ECHO- will check back later if time/schedule allow.   Windell Norfolk, DPT, PN2   Supplemental Physical Therapist [place]    Pager [phone] Acute Rehab Office [phone]

## 2021-02-21 NOTE — TOC Progression Note (Signed)
Transition of Care [facility]) - Progression Note    Patient Details  Name: Warnie Aspinall MRN: [id number] Date of Birth: Dec 29, 1955  Transition of Care [facility]) CM/SW Contact  Bartholomew Crews, RN Phone Number: [phone] 02/21/2021, 1:51 PM  Clinical Narrative:     Received call back from [place] at [facility]. Patient looks to be a candidate for LTAC pending insurance authorization and bed availability. May have a dialysis bed later in the week.   Spoke with patient's wife, Delcie Roch, about possibility of LTAC. Offered choice of LTAC. Delcie Roch would like to proceed with [facility] for possible LTAC opportunity. Anderson [place] notified and will submit authorization.   TOC following.   Expected Discharge Plan: Skilled Nursing Facility Barriers to Discharge: Ship broker, Continued Medical Work up  Expected Discharge Plan and Services Expected Discharge Plan: [place] In-house Referral: Clinical Social Work   Post Acute Care Choice: [place] Living arrangements for the past 2 months: [place]                                       Social Determinants of Health (SDOH) Interventions    Readmission Risk Interventions Readmission Risk Prevention Plan 02/16/2021 01/28/2021  Transportation Screening Complete Complete  Medication Review (RN Care Manager) Referral to Pharmacy Referral to Pharmacy  PCP or Specialist appointment within 3-5 days of discharge Complete Complete  HRI or Home Care Consult Complete Complete  SW Recovery Care/Counseling Consult Complete Complete  Palliative Care Screening Not Applicable Not Applicable  Skilled Nursing Facility Complete Complete

## 2021-02-21 NOTE — Progress Notes (Signed)
*  PRELIMINARY RESULTS* Echocardiogram 2D Echocardiogram has been performed with [facility].  Luisa Hart RDCS 02/21/2021, 3:11 PM

## 2021-02-21 NOTE — Progress Notes (Signed)
Progress Note  Patient Name: Kyle Walton Date of Encounter: 02/21/2021  [facility] Cardiologist: Lauree Chandler, MD   Subjective   Seen by Dr. Angelena Form 02/16/21, asked to re-evaluate today. Patient reports his hip pain is gradually improving, needs minimal pain medications, but overall feels that he is getting weaker since being in the bed. No chest pain, feels that breathing is stable. His blood pressure is low but he reports that this is not new.  Inpatient Medications    Scheduled Meds:  (feeding supplement) PROSource Plus  30 mL Oral BID BM   amiodarone  200 mg Oral BID   apixaban  5 mg Oral BID   atorvastatin  40 mg Oral QPM   chlorhexidine  15 mL Mouth Rinse BID   Chlorhexidine Gluconate Cloth  6 each Topical Q0600   cholestyramine light  4 g Oral BID   clopidogrel  75 mg Oral Daily   [START ON 02/23/2021] darbepoetin (ARANESP) injection - DIALYSIS  200 mcg Intravenous Q Wed-HD   docusate sodium  100 mg Oral BID   fentaNYL  1 patch Transdermal Q72H   ferric citrate  420 mg Oral BID WC   Gerhardt's butt cream   Topical TID   insulin aspart  0-5 Units Subcutaneous QHS   insulin aspart  0-9 Units Subcutaneous TID WC   insulin glargine-yfgn  5 Units Subcutaneous QHS   latanoprost  1 drop Both Eyes QHS   levothyroxine  25 mcg Oral QAC breakfast   mouth rinse  15 mL Mouth Rinse q12n4p   metoprolol succinate  12.5 mg Oral Daily   midodrine  10 mg Oral TID WC   multivitamin  1 tablet Oral QHS   mupirocin ointment  1 application Nasal BID   pantoprazole  40 mg Oral Q0600   povidone-iodine  2 application Topical Once   pregabalin  25 mg Oral BID   saccharomyces boulardii  250 mg Oral BID   Continuous Infusions:  sodium chloride     sodium chloride     ceFEPime (MAXIPIME) IV     lactated ringers     PRN Meds: sodium chloride, sodium chloride, acetaminophen **OR** acetaminophen, alteplase, clonazepam, docusate sodium, heparin, HYDROmorphone, lidocaine (PF),  lidocaine (PF), liver oil-zinc oxide, metoCLOPramide **OR** metoCLOPramide (REGLAN) injection, nitroGLYCERIN, ondansetron **OR** ondansetron (ZOFRAN) IV, pentafluoroprop-tetrafluoroeth, polyethylene glycol   Vital Signs    Vitals:   02/21/21 0310 02/21/21 0400 02/21/21 0823 02/21/21 1218  BP: (!) 95/49 97/68 (!) 82/49 (!) 86/48  Pulse: (!) 101 95 93 94  Resp:  '19 14 19  '$ Temp: 98.1 F (36.7 C) 98.1 F (36.7 C) 98.5 F (36.9 C) 98 F (36.7 C)  TempSrc: Oral Oral Oral Oral  SpO2: 95% 100% 100% 100%  Weight:      Height:       No intake or output data in the 24 hours ending 02/21/21 1305 Last 3 Weights 02/20/2021 02/19/2021 02/18/2021  Weight (lbs) 234 lb 9.1 oz 229 lb 8 oz 225 lb 12 oz  Weight (kg) 106.4 kg 104.1 kg 102.4 kg      Telemetry    Appears more consistent with atypical flutter--clear rapid elevations/drops in heart rate without otherwise showing heart rate variability - Personally Reviewed  ECG    No new since 03/02/2021 - Personally Reviewed  Physical Exam   GEN: No acute distress.   Neck: No JVD appreciated Cardiac: RRR, no murmurs, rubs, or gallops.  Respiratory: Clear to auscultation bilaterally. GI: Soft, nontender, non-distended  MS: No edema; No deformity. Neuro:  Nonfocal  Psych: Normal affect   Labs    High Sensitivity Troponin:  No results for input(s): TROPONINIHS in the last 720 hours.    Chemistry Recent Labs  Lab 02/16/2021 0047 02/18/21 0833 02/18/21 1350  NA 134* 135 134*  K 3.4* 3.7 3.3*  CL 102 101 97*  CO2 19* 22 26  GLUCOSE 110* 156* 109*  BUN 33* 46* 19  CREATININE 4.85* 6.31* 3.33*  CALCIUM 7.7* 7.9* 7.8*  ALBUMIN 1.8* 1.8* 2.0*  GFRNONAA 13* 9* 20*  ANIONGAP '13 12 11     '$ Hematology Recent Labs  Lab 02/18/21 1350 02/19/21 0043 02/20/21 0104  WBC 14.5* 11.7* 10.7*  RBC 2.97* 2.73* 2.75*  HGB 8.4* 7.5* 7.9*  HCT 27.9* 25.5* 25.6*  MCV 93.9 93.4 93.1  MCH 28.3 27.5 28.7  MCHC 30.1 29.4* 30.9  RDW 20.2* 20.3* 20.2*   PLT 229 228 186    BNPNo results for input(s): BNP, PROBNP in the last 168 hours.   DDimer No results for input(s): DDIMER in the last 168 hours.   Radiology    No results found.  Cardiac Studies   Echo pending  Patient Profile     65 y.o. male with complex history, notable for ESRD on HD, anemia of chronic diease, type 2 diabetes, paroxysmal atrial fibrillation/flutter, COPD, OSA on BiPAP, chronic hypoxic respiratory failure on home O2 who has had a complicated hospital course for right thigh abscess. Cardiology asked to evaluate atrial fibrillation/flutter in consultation at the request of Dr. Tyrell Antonio  Assessment & Plan    Paroxsymal atrial fibrillation Atypical atrial flutter -based on his recent telemetry, with the sharp high/low plateau pattern of heart rate, this appears to be most consistent with an atypical flutter.  -he is largely rate controlled, with only occasional elevations in heart rate. Unfortunately, his blood pressure is very low, and he is already on midodrine. He is on amiodarone and metoprolol. I am very concerned about his blood pressure being low, though he denies symptoms. This limits our medication options -he has been restarted on apixaban and is tolerating without issue. As he appears to be only intermittently tachycardic, suspect cardioversion is low utility at this time. Needs to be on uninterrupted anticoagulation, and right now his blood pressure is too soft for elective anesthesia. Defer to primary team on changing medications, transfusion threshold  CAD with history of CABG PAD -continue clopidogrel, on apixaban as above -continue atorvastatin -denies pain  ESRD on HD Anemia of chronic disease Type II diabetes -per primary team  We will follow with you for now, but there are limited options, especially given his chronic hypotension  For questions or updates, please contact [place] Please consult [email or web address] for contact info under      Signed, Buford Dresser, MD  02/21/2021, 1:05 PM

## 2021-02-22 DIAGNOSIS — I9589 Other hypotension: Secondary | ICD-10-CM | POA: Diagnosis not present

## 2021-02-22 DIAGNOSIS — I255 Ischemic cardiomyopathy: Secondary | ICD-10-CM

## 2021-02-22 DIAGNOSIS — I2510 Atherosclerotic heart disease of native coronary artery without angina pectoris: Secondary | ICD-10-CM | POA: Diagnosis not present

## 2021-02-22 DIAGNOSIS — I484 Atypical atrial flutter: Secondary | ICD-10-CM | POA: Diagnosis not present

## 2021-02-22 DIAGNOSIS — E1151 Type 2 diabetes mellitus with diabetic peripheral angiopathy without gangrene: Secondary | ICD-10-CM | POA: Diagnosis not present

## 2021-02-22 LAB — BPAM RBC
Blood Product Expiration Date: 202208222359
ISSUE DATE / TIME: 202208151825
Unit Type and Rh: 1700

## 2021-02-22 LAB — MAGNESIUM: Magnesium: 1.6 mg/dL — ABNORMAL LOW (ref 1.7–2.4)

## 2021-02-22 LAB — CBC
HCT: 28.4 % — ABNORMAL LOW (ref 39.0–52.0)
Hemoglobin: 8.9 g/dL — ABNORMAL LOW (ref 13.0–17.0)
MCH: 28.5 pg (ref 26.0–34.0)
MCHC: 31.3 g/dL (ref 30.0–36.0)
MCV: 91 fL (ref 80.0–100.0)
Platelets: 229 10*3/uL (ref 150–400)
RBC: 3.12 MIL/uL — ABNORMAL LOW (ref 4.22–5.81)
RDW: 19 % — ABNORMAL HIGH (ref 11.5–15.5)
WBC: 12.6 10*3/uL — ABNORMAL HIGH (ref 4.0–10.5)
nRBC: 0.2 % (ref 0.0–0.2)

## 2021-02-22 LAB — TYPE AND SCREEN
ABO/RH(D): B POS
Antibody Screen: NEGATIVE
Unit division: 0

## 2021-02-22 LAB — GLUCOSE, CAPILLARY
Glucose-Capillary: 164 mg/dL — ABNORMAL HIGH (ref 70–99)
Glucose-Capillary: 159 mg/dL — ABNORMAL HIGH (ref 70–99)
Glucose-Capillary: 179 mg/dL — ABNORMAL HIGH (ref 70–99)
Glucose-Capillary: 156 mg/dL — ABNORMAL HIGH (ref 70–99)

## 2021-02-22 MED ORDER — SODIUM CHLORIDE 0.9 % IV SOLN
1.0000 g | INTRAVENOUS | Status: DC
  Administered 2021-02-22 – 2021-02-28 (×5): 1 g via INTRAVENOUS
  Filled 2021-02-22 (×8): qty 1

## 2021-02-22 MED ORDER — AMIODARONE IV BOLUS ONLY 150 MG/100ML
150.0000 mg | Freq: Once | INTRAVENOUS | Status: AC
  Administered 2021-02-22: 150 mg via INTRAVENOUS
  Filled 2021-02-22: qty 100

## 2021-02-22 MED ORDER — MAGNESIUM SULFATE 2 GM/50ML IV SOLN
2.0000 g | Freq: Once | INTRAVENOUS | Status: AC
  Administered 2021-02-22: 2 g via INTRAVENOUS
  Filled 2021-02-22: qty 50

## 2021-02-22 MED ORDER — AMIODARONE HCL 150 MG/3ML IV SOLN
150.0000 mg | Freq: Once | INTRAVENOUS | Status: DC
  Filled 2021-02-22 (×2): qty 3

## 2021-02-22 MED ORDER — NEPRO/CARBSTEADY PO LIQD
237.0000 mL | ORAL | Status: DC
  Administered 2021-02-22 – 2021-02-28 (×5): 237 mL via ORAL

## 2021-02-22 NOTE — Progress Notes (Signed)
Progress Note  Patient Name: Kyle Walton Date of Encounter: 02/22/2021  [facility] Cardiologist: Kyle Chandler, MD   Subjective   No acute events, feels unchanged. He has continued to jump between 90-120 heart rate. He cannot feel it. We discussed management choices and risk, see below.  Inpatient Medications    Scheduled Meds:  (feeding supplement) PROSource Plus  30 mL Oral BID BM   amiodarone  200 mg Oral BID   apixaban  5 mg Oral BID   atorvastatin  40 mg Oral QPM   chlorhexidine  15 mL Mouth Rinse BID   Chlorhexidine Gluconate Cloth  6 each Topical Q0600   cholestyramine light  4 g Oral BID   clopidogrel  75 mg Oral Daily   [START ON 02/23/2021] darbepoetin (ARANESP) injection - DIALYSIS  200 mcg Intravenous Q Wed-HD   docusate sodium  100 mg Oral BID   fentaNYL  1 patch Transdermal Q72H   ferric citrate  420 mg Oral BID WC   Gerhardt's butt cream   Topical TID   insulin aspart  0-5 Units Subcutaneous QHS   insulin aspart  0-9 Units Subcutaneous TID WC   insulin glargine-yfgn  5 Units Subcutaneous QHS   latanoprost  1 drop Both Eyes QHS   levothyroxine  25 mcg Oral QAC breakfast   mouth rinse  15 mL Mouth Rinse q12n4p   metoprolol succinate  12.5 mg Oral Daily   midodrine  10 mg Oral TID WC   multivitamin  1 tablet Oral QHS   pantoprazole  40 mg Oral Q0600   povidone-iodine  2 application Topical Once   pregabalin  25 mg Oral BID   saccharomyces boulardii  250 mg Oral BID   Continuous Infusions:  ceFEPime (MAXIPIME) IV Stopped (02/21/21 1820)   lactated ringers Stopped (02/21/21 1532)   PRN Meds: acetaminophen **OR** acetaminophen, clonazepam, docusate sodium, HYDROmorphone, liver oil-zinc oxide, metoCLOPramide **OR** metoCLOPramide (REGLAN) injection, nitroGLYCERIN, ondansetron **OR** ondansetron (ZOFRAN) IV, polyethylene glycol   Vital Signs    Vitals:   02/22/21 0438 02/22/21 0439 02/22/21 0712 02/22/21 0810  BP: 105/79  (!) 94/53   Pulse:  (!) 110  (!) 123   Resp: '16  20 20  '$ Temp: 98.6 F (37 C)  (!) 97.4 F (36.3 C)   TempSrc: Oral  Oral   SpO2: 100%  98%   Weight:  109.6 kg    Height:        Intake/Output Summary (Last 24 hours) at 02/22/2021 0857 Last data filed at 02/22/2021 0415 Gross per 24 hour  Intake 760 ml  Output 1000 ml  Net -240 ml   Last 3 Weights 02/22/2021 02/22/2021 02/22/2021  Weight (lbs) 241 lb 10 oz (No Data) (No Data)  Weight (kg) 109.6 kg (No Data) (No Data)      Telemetry    Appears more consistent with atypical flutter in the general trend--clear rapid elevations/drops in heart rate without otherwise showing heart rate variability - Personally Reviewed  ECG    No new since 03/07/2021 - Personally Reviewed  Physical Exam   GEN: Well nourished, well developed in no acute distress NECK: No JVD appreciated CARDIAC: tachycardic but regular rhythm, normal S1 and S2, no rubs or gallops. No murmur. VASCULAR: Radial pulses 2+ bilaterally.  RESPIRATORY:  Clear to auscultation anterior and lateral ABDOMEN: Soft, non-tender, non-distended SKIN: Warm and dry, no edema PSYCHIATRIC:  Normal affect    Labs    High Sensitivity Troponin:  No results for  input(s): TROPONINIHS in the last 720 hours.    Chemistry Recent Labs  Lab 02/18/21 0833 02/18/21 1350 02/21/21 1237  NA 135 134* 134*  K 3.7 3.3* 3.7  CL 101 97* 102  CO2 22 26 18*  GLUCOSE 156* 109* 166*  BUN 46* 19 56*  CREATININE 6.31* 3.33* 7.21*  CALCIUM 7.9* 7.8* 7.6*  ALBUMIN 1.8* 2.0* 1.7*  GFRNONAA 9* 20* 8*  ANIONGAP '12 11 14     '$ Hematology Recent Labs  Lab 02/20/21 0104 02/21/21 1237 02/22/21 0730  WBC 10.7* 13.8* 12.6*  RBC 2.75* 2.67* 3.12*  HGB 7.9* 7.4* 8.9*  HCT 25.6* 24.9* 28.4*  MCV 93.1 93.3 91.0  MCH 28.7 27.7 28.5  MCHC 30.9 29.7* 31.3  RDW 20.2* 19.9* 19.0*  PLT 186 211 229    BNPNo results for input(s): BNP, PROBNP in the last 168 hours.   DDimer No results for input(s): DDIMER in the last 168  hours.   Radiology    ECHOCARDIOGRAM COMPLETE  Result Date: 02/21/2021    ECHOCARDIOGRAM REPORT   Patient Name:   Kyle Walton Date of Exam: 02/21/2021 Medical Rec #:  [id number]     Height:       70.0 in Accession #:    [id number]    Weight:       234.6 lb Date of Birth:  07-27-1955     BSA:          2.234 m Patient Age:    65 years      BP:           82/49 mmHg Patient Gender: M             HR:           95 bpm. Exam Location:  Inpatient Procedure: 2D Echo, Cardiac Doppler, Color Doppler and Intracardiac            Opacification Agent Indications:    Abn EKG  History:        Patient has no prior history of Echocardiogram examinations.                 CHF, Previous Myocardial Infarction, Defibrillator, Prior CABG,                 Prior Cardiac Surgery and Abnormal ECG, Arrythmias:Atrial                 Fibrillation, Signs/Symptoms:Chest Pain; Risk Factors:Diabetes.                 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  Sonographer:    Luisa Hart RDCS Referring Phys: [id number] BELKYS A REGALADO  Sonographer Comments: Technically challenging study due to limited acoustic windows and patient is morbidly obese. Image acquisition challenging due to patient body habitus. IMPRESSIONS  1. Left ventricular ejection fraction, by estimation, is 30 to 35%. The left ventricle has moderately decreased function. The left ventricle demonstrates global hypokinesis. There is mild left ventricular hypertrophy. Left ventricular diastolic parameters are indeterminate.  2. Right ventricular systolic function was not well visualized. The right ventricular size is normal There is a small (< 1 cm)  echodensity on the RV device lead best seen on image 16.  3. The mitral valve is abnormal. Trivial mitral valve regurgitation. No evidence of mitral stenosis. Moderate mitral annular calcification.  4. The aortic valve is  tricuspid. Aortic valve regurgitation is not visualized. Mild to moderate aortic valve sclerosis/calcification is present, without any evidence of aortic stenosis.  5. The inferior vena cava is dilated in size with <50% respiratory variability, suggesting right atrial pressure of 15 mmHg. Comparison(s): No prior Echocardiogram. FINDINGS  Left Ventricle: Left ventricular ejection fraction, by estimation, is 30 to 35%. The left ventricle has moderately decreased function. The left ventricle demonstrates global hypokinesis. Definity contrast agent was given IV to delineate the left ventricular endocardial borders. The left ventricular internal cavity size was normal in size. There is mild left ventricular hypertrophy. Left ventricular diastolic parameters are indeterminate. Right Ventricle: The right ventricular size is normal. Right vetricular wall thickness was not well visualized. Right ventricular systolic function was not well visualized. Left Atrium: Left atrial size was normal in size. Right Atrium: Right atrial size was normal in size. Pericardium: There is no evidence of pericardial effusion. Mitral Valve: The mitral valve is abnormal. Moderate mitral annular calcification. Trivial mitral valve regurgitation. No evidence of mitral valve stenosis. Tricuspid Valve: The tricuspid valve is normal in structure. Tricuspid valve regurgitation is trivial. Aortic Valve: The aortic valve is tricuspid. Aortic valve regurgitation is not visualized. Mild to moderate aortic valve sclerosis/calcification is present, without any evidence of aortic stenosis. Aortic valve mean gradient measures 5.0 mmHg. Aortic valve peak gradient measures 8.2 mmHg. Aortic valve area, by VTI measures 1.84 cm. Pulmonic Valve: The pulmonic valve was not well visualized. Pulmonic valve regurgitation is not visualized. No evidence of pulmonic stenosis. Aorta: The aortic root is normal in size and structure. Venous: The inferior vena cava is  dilated in size with less than 50% respiratory variability, suggesting right atrial pressure of 15 mmHg. IAS/Shunts: The atrial septum is grossly normal. Additional Comments: A device lead is visualized.  LEFT VENTRICLE PLAX 2D LVIDd:         4.50 cm  Diastology LVIDs:         2.70 cm  LV e' medial:    4.60 cm/s LV PW:         1.50 cm  LV E/e' medial:  29.3 LV IVS:        1.70 cm  LV e' lateral:   6.53 cm/s LVOT diam:     2.10 cm  LV E/e' lateral: 20.7 LV SV:         40 LV SV Index:   18 LVOT Area:     3.46 cm  LEFT ATRIUM             Index       RIGHT ATRIUM           Index LA Vol (A2C):   59.3 ml 26.55 ml/m RA Area:     23.50 cm LA Vol (A4C):   66.6 ml 29.82 ml/m RA Volume:   69.50 ml  31.11 ml/m LA Biplane Vol: 64.9 ml 29.06 ml/m  AORTIC VALVE                    PULMONIC VALVE AV Area (Vmax):    1.83 cm     PV Vmax:       0.76 m/s AV Area (Vmean):   1.68 cm     PV Vmean:  53.800 cm/s AV Area (VTI):     1.84 cm     PV VTI:        0.152 m AV Vmax:           143.00 cm/s  PV Peak grad:  2.3 mmHg AV Vmean:          102.000 cm/s PV Mean grad:  1.0 mmHg AV VTI:            0.218 m AV Peak Grad:      8.2 mmHg AV Mean Grad:      5.0 mmHg LVOT Vmax:         75.50 cm/s LVOT Vmean:        49.500 cm/s LVOT VTI:          0.116 m LVOT/AV VTI ratio: 0.53  AORTA Ao Root diam: 2.90 cm Ao Asc diam:  2.50 cm MITRAL VALVE                TRICUSPID VALVE MV Area (PHT): 4.01 cm     TR Peak grad:   45.4 mmHg MV Decel Time: 189 msec     TR Vmax:        337.00 cm/s MV E velocity: 135.00 cm/s                             SHUNTS                             Systemic VTI:  0.12 m                             Systemic Diam: 2.10 cm Rudean Haskell MD Electronically signed by Rudean Haskell MD Signature Date/Time: 02/21/2021/3:39:54 PM    Final     Cardiac Studies   Echo 02/21/21  1. Left ventricular ejection fraction, by estimation, is 30 to 35%. The  left ventricle has moderately decreased function. The left ventricle   demonstrates global hypokinesis. There is mild left ventricular  hypertrophy. Left ventricular diastolic  parameters are indeterminate.   2. Right ventricular systolic function was not well visualized. The right  ventricular size is normal There is a small (< 1 cm) echodensity on the RV  device lead best seen on image 16.   3. The mitral valve is abnormal. Trivial mitral valve regurgitation. No  evidence of mitral stenosis. Moderate mitral annular calcification.   4. The aortic valve is tricuspid. Aortic valve regurgitation is not  visualized. Mild to moderate aortic valve sclerosis/calcification is  present, without any evidence of aortic stenosis.   5. The inferior vena cava is dilated in size with <50% respiratory  variability, suggesting right atrial pressure of 15 mmHg.   Comparison(s): No prior Echocardiogram.   Patient Profile     65 y.o. male with complex history, notable for ESRD on HD, anemia of chronic diease, type 2 diabetes, paroxysmal atrial fibrillation/flutter, COPD, OSA on BiPAP, chronic hypoxic respiratory failure on home O2 who has had a complicated hospital course for right thigh abscess. Cardiology asked to evaluate atrial fibrillation/flutter in consultation at the request of Dr. Tyrell Antonio  Assessment & Plan    Paroxsymal atrial fibrillation Atypical atrial flutter/atrial tachycardia -based on his recent telemetry, with the sharp high/low plateau pattern of heart rate, this appears to be most consistent with an atypical flutter. Brief strips  look more like afib, without clear flutter waves, but overall heart rate pattern more consistent with atach/atypical flutter -he is largely rate controlled, but he does have occasional spikes into the 120s. We discussed options at length today. With his comorbidities and chronic hypotension, he is high risk for prolonged anesthesia. A cardioversion only would be much safer than a TEE-CV given the needed duration of anesthesia. He  understands and agrees. He started apixaban on 02/09/2021; he could be cardioverted without TEE on 9/1 at the earliest. Will attempt to manage medically until then. Will give IV amio bolus today. Continue PO amiodarone and low dose metoprolol  Cardiomyopathy, likely ischemic etiology: -no prior echo in our system. Echo yesterday showed EF 30-35%. On review of Care Everywhere, this is similar to an echo he had done at [facility] 09/17/20.  -only GDMT he can tolerate is low dose metoprolol. With chronic hypotension and ESRD, no other medications are appropriate  CAD with history of CABG PAD -continue clopidogrel, on apixaban as above -continue atorvastatin -denies pain  ESRD on HD Anemia of chronic disease Type II diabetes -per primary team  For questions or updates, please contact [facility] Please consult [email or web address] for contact info under     Signed, Buford Dresser, MD  02/22/2021, 8:57 AM

## 2021-02-22 NOTE — Progress Notes (Addendum)
Pharmacy Antibiotic Note - follow up  Kyle Walton is a 65 y.o. male admitted on 02/24/2021 with recurrent R thigh abscess/pyomyositis s/p I&D 8/11. No cultures sent. Plan 14d of cefepime per ID. Of note, patient is on dialysis.  Pharmacy has been consulted for cefepime dosing.  Patient was planned to get dialysis 8/15 but was moved to 8/16 due to staffing issues. Will change to 1g Q 24 hr dosing given current dialysis staffing challenges.   Plan: Cefepime 1g Q 24 hr  End date 8/25 entered   Height: '5\' 10"'$  (177.8 cm) Weight: 109.6 kg (241 lb 10 oz) IBW/kg (Calculated) : 73  Temp (24hrs), Avg:98.2 F (36.8 C), Min:97.4 F (36.3 C), Max:98.9 F (37.2 C)  Recent Labs  Lab 02/16/21 0242 03/08/2021 0047 02/16/2021 1439 02/18/21 0833 02/18/21 0834 02/18/21 1350 02/19/21 0043 02/20/21 0104 02/21/21 1237 02/22/21 0730  WBC 11.9*  --    < >  --    < > 14.5* 11.7* 10.7* 13.8* 12.6*  CREATININE 3.77* 4.85*  --  6.31*  --  3.33*  --   --  7.21*  --   LATICACIDVEN  --   --   --   --   --   --   --   --  1.1  --    < > = values in this interval not displayed.     Estimated Creatinine Clearance: 12.7 mL/min (A) (by C-G formula based on SCr of 7.21 mg/dL (H)).    Allergies  Allergen Reactions   Morphine Other (See Comments)    Per son, Kyle Walton, pt becomes unresponsive/disoriented- especially when given after HD tx AND "ALLERGIC," per [facility]   Liraglutide Diarrhea and Other (See Comments)    "Phenol"...."ALLERGIC," per [facility]    Antimicrobials Vanc 8/1>>8/11 Cefepime 8/1>>8/25  Microbiology  8/10 MRSA nasal PCR: + 8/1 Bcx: ngtd 7/14: R thigh: Serratia R to Ancef 7/14 MRSA PCR: positive,  SA: positive  Thank you for allowing pharmacy to be a part of this patient's care.  Benetta Spar, PharmD, BCPS, BCCP Clinical Pharmacist  Please check AMION for all [place] phone numbers After 10:00 PM, call [place] [phone]

## 2021-02-22 NOTE — Progress Notes (Signed)
RT note. Patient refuse cpap for tonight, patient currently on [facility] sat 98%. CPAP is in patients room if he decides to change his mind, RT will continue to monitor

## 2021-02-22 NOTE — Progress Notes (Addendum)
PROGRESS NOTE    Kyle Walton  [id number] DOB: 1956/03/28 DOA: 02/21/2021 PCP: Patient, No Pcp Per (Inactive)   Brief Narrative: 21 chronically ill with ESRD, anemia of chronic disease, type 2 diabetes, chronic diastolic heart failure, paroxysmal A. fib on Eliquis, COPD chronic hypoxic respiratory failure on 2 L of oxygen, nocturnal BiPAP, hypothyroidism recently discharged from [facility] 7/26: Treated for right thigh abscess, underwent I&D on 7/14.  Completed 7 days of IV therapy inpatient and discharged to SNF on oral ciprofloxacin, which he  may not have started.  Was seen by the wound nurse 8/1, noted to have increasing swelling tenderness and drainage from the wound, subsequently sent to the ED.  In the emergency room was briefly hypotensive, subsequently stabilized, started on broad-spectrum antibiotics, Ortho consulted, initially recommended antibiotics.  Treated with broad-spectrum antibiotics. Underwent incision  and drainage of wound 8/11. Wound vac removed 8/16/. He will needs 2 weeks cefepime with HD until 03/03/2021.   Assessment & Plan:   Active Problems:   CAD (coronary artery disease)   Diabetes mellitus with peripheral vascular disease ([place])   ESRD on hemodialysis ([place])   Acquired hypothyroidism   Open thigh wound, right, sequela   Wound infection   Ischemic cardiomyopathy     Pressure Injury 12/11/20 Sacrum Right;Left;Mid Stage 2 -  Partial thickness loss of dermis presenting as a shallow open injury with a red, pink wound bed without slough. (Active)  12/11/20 0551  Location: Sacrum  Location Orientation: Right;Left;Mid  Staging: Stage 2 -  Partial thickness loss of dermis presenting as a shallow open injury with a red, pink wound bed without slough.  Wound Description (Comments):   Present on Admission:     1-Right Thigh Abscess:  -Recent I and D on 7/14, culture grew Serratia resistant to Ancef. (Prior admission)  -Admitted with clinical  worsening wound, CT concern for developing compartment syndrome, substantially reduced volume of the mixed density collection right Vastus Lateralis. Locules of subcutaneous gas density track along approximately 13 cm.  -Appreciate orthopedic follow-up, sutures removed to allow drainage. -Underwent I and D of wound and application of negative pressure wound dressing. 8/11. -Plan for two weeks Cefepime with HD End date 03/03/2021. Per ID recommendations.  -Wound Vac removed 8/16. Monitor wound.  Wound care per Ortho: Twice daily dressing changes with a dry dressing of gauze, 2 ABDs and tape per shift. -Suture will be maintained until 3 weeks  postsurgery (around 03/10/2021).  If discharged by that time will be removed outpatient at the office.  He will need to follow-up within 2 weeks after surgery. WBC trending down.   2-ESRD on hemodialysis: Dialysis TTS.  Diabetes type 2: Continue with Lantus.  Chronic hypotension: Continue with midodrine. BP soft . Repeated lactic acid normal.  Received Blood transfusion 8/15. Hb increase to 8.9  Chronic Diastolic heart failure: Volume managed with hemodialysis.  CAD/CABG: Beta-blocker and statin Resume plavix   Paroxysmal A. fib: Continue with Toprol amiodarone Ok to resume Eliquis, discussed with Legrand Como PA with ortho.  Appreciate cardiology/  Atypical A flutter on monitor today. Plan for IV amiodarone.  Mg level low, Replete IV.   COPD/chronic respiratory failure: Continue with BiPAP nightly  History of PAD: Prior to amputation and metatarsal amputation.  Continue with statin and Plavix .  Anemia of chronic disease: Continue with Epogen with hemodialysis. Hb : 7.5--7.9---8.9 Received Blood transfusion 8/15. On Aranesp.   Nutrition; hypoalbuminemia; will order nepro.   Loose stool; started florastore.  Estimated  body mass index is 34.67 kg/m as calculated from the following:   Height as of this encounter: '5\' 10"'$  (1.778 m).   Weight as of  this encounter: 109.6 kg.   DVT prophylaxis: SCD Code Status: Full Code Family Communication: care discussed with patient. Wife 8/14 Disposition Plan:  Status is: Inpatient  Remains inpatient appropriate because:IV treatments appropriate due to intensity of illness or inability to take PO  Dispo: The patient is from: SNF              Anticipated d/c is to: SNF              Patient currently is not medically stable to d/c.   Difficult to place patient No        Consultants:  Ortho Nephrology Cardiology  Procedures:  I and D   Antimicrobials:    Subjective: He is sleepy, but able to answer questions. He was not able to sleep last night. He had HD last night. He arrives to his room at 4 am from HD>  He denies chest pain.  He will like to go to LTAC  Objective: Vitals:   02/22/21 0438 02/22/21 0439 02/22/21 0712 02/22/21 0810  BP: 105/79  (!) 94/53   Pulse: (!) 110  (!) 123   Resp: '16  20 20  '$ Temp: 98.6 F (37 C)  (!) 97.4 F (36.3 C)   TempSrc: Oral  Oral   SpO2: 100%  98%   Weight:  109.6 kg    Height:        Intake/Output Summary (Last 24 hours) at 02/22/2021 1014 Last data filed at 02/22/2021 0415 Gross per 24 hour  Intake 760 ml  Output 1000 ml  Net -240 ml    Filed Weights   02/20/21 0326 02/22/21 0439  Weight: 106.4 kg 109.6 kg    Examination:  General exam: NAD Respiratory system: CTA Cardiovascular system: S 1, S 2 RRR Gastrointestinal system: BS present soft,,  Central nervous system:  alert Extremities: he still had Wound Vac during my evaluation today.     Data Reviewed: I have personally reviewed following labs and imaging studies  CBC: Recent Labs  Lab 02/18/21 1350 02/19/21 0043 02/20/21 0104 02/21/21 1237 02/22/21 0730  WBC 14.5* 11.7* 10.7* 13.8* 12.6*  HGB 8.4* 7.5* 7.9* 7.4* 8.9*  HCT 27.9* 25.5* 25.6* 24.9* 28.4*  MCV 93.9 93.4 93.1 93.3 91.0  PLT 229 228 186 211 [id number]    Basic Metabolic Panel: Recent Labs   Lab 02/16/21 0242 02/27/2021 0047 02/18/21 0833 02/18/21 1350 02/21/21 1237  NA 134* 134* 135 134* 134*  K 3.5 3.4* 3.7 3.3* 3.7  CL 100 102 101 97* 102  CO2 25 19* 22 26 18*  GLUCOSE 147* 110* 156* 109* 166*  BUN 24* 33* 46* 19 56*  CREATININE 3.77* 4.85* 6.31* 3.33* 7.21*  CALCIUM 7.8* 7.7* 7.9* 7.8* 7.6*  PHOS  --  3.9 6.3* 3.1 6.5*    GFR: Estimated Creatinine Clearance: 12.7 mL/min (A) (by C-G formula based on SCr of 7.21 mg/dL (H)). Liver Function Tests: Recent Labs  Lab 02/24/2021 0047 02/18/21 0833 02/18/21 1350 02/21/21 1237  ALBUMIN 1.8* 1.8* 2.0* 1.7*    No results for input(s): LIPASE, AMYLASE in the last 168 hours. No results for input(s): AMMONIA in the last 168 hours. Coagulation Profile: No results for input(s): INR, PROTIME in the last 168 hours. Cardiac Enzymes: No results for input(s): CKTOTAL, CKMB, CKMBINDEX, TROPONINI in the last 168 hours.  BNP (last 3 results) No results for input(s): PROBNP in the last 8760 hours. HbA1C: No results for input(s): HGBA1C in the last 72 hours. CBG: Recent Labs  Lab 02/21/21 0826 02/21/21 1223 02/21/21 1738 02/21/21 2125 02/22/21 0744  GLUCAP 131* 180* 142* 258* 164*    Lipid Profile: No results for input(s): CHOL, HDL, LDLCALC, TRIG, CHOLHDL, LDLDIRECT in the last 72 hours. Thyroid Function Tests: No results for input(s): TSH, T4TOTAL, FREET4, T3FREE, THYROIDAB in the last 72 hours. Anemia Panel: No results for input(s): VITAMINB12, FOLATE, FERRITIN, TIBC, IRON, RETICCTPCT in the last 72 hours. Sepsis Labs: Recent Labs  Lab 02/21/21 1237  LATICACIDVEN 1.1     Recent Results (from the past 240 hour(s))  Surgical PCR screen     Status: Abnormal   Collection Time: 02/16/21 11:19 PM   Specimen: Nasal Mucosa; Nasal Swab  Result Value Ref Range Status   MRSA, PCR NEGATIVE NEGATIVE Final   Staphylococcus aureus POSITIVE (A) NEGATIVE Final    Comment: (NOTE) The Xpert SA Assay (FDA approved for NASAL  specimens in patients 58 years of age and older), is one component of a comprehensive surveillance program. It is not intended to diagnose infection nor to guide or monitor treatment. Performed at [facility], [place] [address] [place], [place] [address]           Radiology Studies: ECHOCARDIOGRAM COMPLETE  Result Date: 02/21/2021    ECHOCARDIOGRAM REPORT   Patient Name:   OSVIN TYNER Date of Exam: 02/21/2021 Medical Rec #:  [id number]     Height:       70.0 in Accession #:    [id number]    Weight:       234.6 lb Date of Birth:  June 19, 1956     BSA:          2.234 m Patient Age:    65 years      BP:           82/49 mmHg Patient Gender: M             HR:           95 bpm. Exam Location:  Inpatient Procedure: 2D Echo, Cardiac Doppler, Color Doppler and Intracardiac            Opacification Agent Indications:    Abn EKG  History:        Patient has no prior history of Echocardiogram examinations.                 CHF, Previous Myocardial Infarction, Defibrillator, Prior CABG,                 Prior Cardiac Surgery and Abnormal ECG, Arrythmias:Atrial                 Fibrillation, Signs/Symptoms:Chest Pain; Risk Factors:Diabetes.                 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  Sonographer:    Luisa Hart RDCS Referring Phys: [id number] Chelsei Mcchesney A Glena Pharris  Sonographer Comments: Technically challenging study due to limited acoustic windows and patient is morbidly obese. Image acquisition  challenging due to patient body habitus. IMPRESSIONS  1. Left ventricular ejection fraction, by estimation, is 30 to 35%. The left ventricle has moderately decreased function. The left ventricle demonstrates global hypokinesis. There is mild left ventricular hypertrophy. Left ventricular diastolic parameters are indeterminate.  2. Right ventricular systolic function was not well visualized. The right  ventricular size is normal There is a small (< 1 cm) echodensity on the RV device lead best seen on image 16.  3. The mitral valve is abnormal. Trivial mitral valve regurgitation. No evidence of mitral stenosis. Moderate mitral annular calcification.  4. The aortic valve is tricuspid. Aortic valve regurgitation is not visualized. Mild to moderate aortic valve sclerosis/calcification is present, without any evidence of aortic stenosis.  5. The inferior vena cava is dilated in size with <50% respiratory variability, suggesting right atrial pressure of 15 mmHg. Comparison(s): No prior Echocardiogram. FINDINGS  Left Ventricle: Left ventricular ejection fraction, by estimation, is 30 to 35%. The left ventricle has moderately decreased function. The left ventricle demonstrates global hypokinesis. Definity contrast agent was given IV to delineate the left ventricular endocardial borders. The left ventricular internal cavity size was normal in size. There is mild left ventricular hypertrophy. Left ventricular diastolic parameters are indeterminate. Right Ventricle: The right ventricular size is normal. Right vetricular wall thickness was not well visualized. Right ventricular systolic function was not well visualized. Left Atrium: Left atrial size was normal in size. Right Atrium: Right atrial size was normal in size. Pericardium: There is no evidence of pericardial effusion. Mitral Valve: The mitral valve is abnormal. Moderate mitral annular calcification. Trivial mitral valve regurgitation. No evidence of mitral valve stenosis. Tricuspid Valve: The tricuspid valve is normal in structure. Tricuspid valve regurgitation is trivial. Aortic Valve: The aortic valve is tricuspid. Aortic valve regurgitation is not visualized. Mild to moderate aortic valve sclerosis/calcification is present, without any evidence of aortic stenosis. Aortic valve mean gradient measures 5.0 mmHg. Aortic valve peak gradient measures 8.2 mmHg. Aortic  valve area, by VTI measures 1.84 cm. Pulmonic Valve: The pulmonic valve was not well visualized. Pulmonic valve regurgitation is not visualized. No evidence of pulmonic stenosis. Aorta: The aortic root is normal in size and structure. Venous: The inferior vena cava is dilated in size with less than 50% respiratory variability, suggesting right atrial pressure of 15 mmHg. IAS/Shunts: The atrial septum is grossly normal. Additional Comments: A device lead is visualized.  LEFT VENTRICLE PLAX 2D LVIDd:         4.50 cm  Diastology LVIDs:         2.70 cm  LV e' medial:    4.60 cm/s LV PW:         1.50 cm  LV E/e' medial:  29.3 LV IVS:        1.70 cm  LV e' lateral:   6.53 cm/s LVOT diam:     2.10 cm  LV E/e' lateral: 20.7 LV SV:         40 LV SV Index:   18 LVOT Area:     3.46 cm  LEFT ATRIUM             Index       RIGHT ATRIUM           Index LA Vol (A2C):   59.3 ml 26.55 ml/m RA Area:     23.50 cm LA Vol (A4C):   66.6 ml 29.82 ml/m RA Volume:   69.50 ml  31.11 ml/m LA Biplane Vol:  64.9 ml 29.06 ml/m  AORTIC VALVE                    PULMONIC VALVE AV Area (Vmax):    1.83 cm     PV Vmax:       0.76 m/s AV Area (Vmean):   1.68 cm     PV Vmean:      53.800 cm/s AV Area (VTI):     1.84 cm     PV VTI:        0.152 m AV Vmax:           143.00 cm/s  PV Peak grad:  2.3 mmHg AV Vmean:          102.000 cm/s PV Mean grad:  1.0 mmHg AV VTI:            0.218 m AV Peak Grad:      8.2 mmHg AV Mean Grad:      5.0 mmHg LVOT Vmax:         75.50 cm/s LVOT Vmean:        49.500 cm/s LVOT VTI:          0.116 m LVOT/AV VTI ratio: 0.53  AORTA Ao Root diam: 2.90 cm Ao Asc diam:  2.50 cm MITRAL VALVE                TRICUSPID VALVE MV Area (PHT): 4.01 cm     TR Peak grad:   45.4 mmHg MV Decel Time: 189 msec     TR Vmax:        337.00 cm/s MV E velocity: 135.00 cm/s                             SHUNTS                             Systemic VTI:  0.12 m                             Systemic Diam: 2.10 cm Rudean Haskell MD  Electronically signed by Rudean Haskell MD Signature Date/Time: 02/21/2021/3:39:54 PM    Final         Scheduled Meds:  (feeding supplement) PROSource Plus  30 mL Oral BID BM   amiodarone  200 mg Oral BID   apixaban  5 mg Oral BID   atorvastatin  40 mg Oral QPM   chlorhexidine  15 mL Mouth Rinse BID   Chlorhexidine Gluconate Cloth  6 each Topical Q0600   cholestyramine light  4 g Oral BID   clopidogrel  75 mg Oral Daily   [START ON 02/23/2021] darbepoetin (ARANESP) injection - DIALYSIS  200 mcg Intravenous Q Wed-HD   docusate sodium  100 mg Oral BID   fentaNYL  1 patch Transdermal Q72H   ferric citrate  420 mg Oral BID WC   Gerhardt's butt cream   Topical TID   insulin aspart  0-5 Units Subcutaneous QHS   insulin aspart  0-9 Units Subcutaneous TID WC   insulin glargine-yfgn  5 Units Subcutaneous QHS   latanoprost  1 drop Both Eyes QHS   levothyroxine  25 mcg Oral QAC breakfast   mouth rinse  15 mL Mouth Rinse q12n4p   metoprolol succinate  12.5 mg Oral Daily   midodrine  10 mg Oral TID  WC   multivitamin  1 tablet Oral QHS   pantoprazole  40 mg Oral Q0600   povidone-iodine  2 application Topical Once   pregabalin  25 mg Oral BID   saccharomyces boulardii  250 mg Oral BID   Continuous Infusions:  amiodarone     ceFEPime (MAXIPIME) IV     lactated ringers Stopped (02/21/21 1532)     LOS: 15 days    Time spent: 35  minutes.     Elmarie Shiley, MD [facility]   If 7PM-7AM, please contact night-coverage [email or web address]  02/22/2021, 10:14 AM

## 2021-02-22 NOTE — Progress Notes (Signed)
Subjective:  Chayan Duskey is a 65 y.o. male, 5 Days Post-Op    s/p Procedure(s): IRRIGATION AND DEBRIDEMENT RIGHT THIGH APPLICATION OF WOUND VAC LACERATION REPAIR OF TONGUE   Patient reports pain as moderate.  Reports weakness in the bilateral legs. Is hopeful to get back to walking with therapy. Slightly confused on exam, alert then briefly drowsy.  Objective:  Constitutional: General Appearance: NAD in hospital bed Level of Distress: no acute distress. Eyes: Lens (normal) clear: both eyes. Head: Head: normocephalic and atraumatic. Lungs: Respiratory effort: no dyspnea. Skin: Inspection and palpation: no rash, lesions Neurologic: Cranial Nerves: grossly intact. Sensation: grossly intact.  Right lower extremity:  Wound vac removed revealing healthy appeared wound edges with nylon sutures present. A fair amount of cloudy bloody drainage emerged from the incision at the time of the vac being removed - this was evacuated with manual manipulation. No spreading redness.     Status post amputation of all 5 toes, Mepilex dressings on the heel.  Able to feel sensation to light touch the ankle lower leg and knee, generally decreased as baseline.  Able to gently move the knee and ankle.  No acute signs of infection, no significant erythema noted.Marland Kitchen  1+ DP pulse.       VITALS:   Vitals:   02/22/21 0712 02/22/21 0810 02/22/21 1047 02/22/21 1145  BP: (!) 94/53   (!) 92/55  Pulse: (!) 123  98 99  Resp: '20 20 17 19  '$ Temp: (!) 97.4 F (36.3 C)   97.6 F (36.4 C)  TempSrc: Oral   Oral  SpO2: 98%  100% 100%  Weight:      Height:        .km Lab Results  Component Value Date   WBC 12.6 (H) 02/22/2021   HGB 8.9 (L) 02/22/2021   HCT 28.4 (L) 02/22/2021   MCV 91.0 02/22/2021   PLT 229 02/22/2021   BMET    Component Value Date/Time   NA 134 (L) 02/21/2021 1237   K 3.7 02/21/2021 1237   CL 102 02/21/2021 1237   CO2 18 (L) 02/21/2021 1237   GLUCOSE 166 (H) 02/21/2021 1237   BUN  56 (H) 02/21/2021 1237   CREATININE 7.21 (H) 02/21/2021 1237   CALCIUM 7.6 (L) 02/21/2021 1237   GFRNONAA 8 (L) 02/21/2021 1237   GFRAA 6 (L) 01/17/2020 1605     Assessment/Plan: 5 Days Post-Op   Active Problems:   CAD (coronary artery disease)   Diabetes mellitus with peripheral vascular disease ([facility])   ESRD on hemodialysis ([facility])   Acquired hypothyroidism   Open thigh wound, right, sequela   Wound infection   Ischemic cardiomyopathy   Up with therapy  Discharge : Per primary - patient would benefit from monitoring of the wound by the nursing staff at dressing changes since the Vac was just removed.   Wound care:  Wound vac Removed and drainage noted - at this juncture recommended BID dressing changes with a dry dressing of gauze, two ABDs and tape per shift. It is expected that this will drain for a period of time secondary to seroma. Wound itself looks healthy on exam today.    Sutures will be maintained until 3 weeks status post surgery (around 03/10/21), if discharged by that time will be removed outpatient at the office.Should follow up at our office at 2 weeks s/p surgery.   Ortho will follow along.   Weightbearing Status: Weightbearing as tolerated, recommend moving the hip knee and ankle  as tolerated to maintain motion DVT Prophylaxis: Eliquis per primary  Antibiotics: Per infectious disease   [name] 02/22/2021, 12:41 PM  Jonelle Sidle PA-C  Physician Assistant with Dr. Lillia Abed [facility]

## 2021-02-22 NOTE — Progress Notes (Signed)
Occupational Therapy Treatment Patient Details Name: Kyle Walton MRN: [id number] DOB: 02/22/1956 Today's Date: 02/22/2021    History of present illness Pt is a 65 year old man admitted on 02/27/2021 with non healing R thigh wound now s/p I&D on 8/11. He was admitted in July R thigh infection with hospital course complicated by post op acute respiratior failure, he discharged to SNF for rehab. PMH: ESRD, DM2, morbid obesity, CHF, PAF, chronic hypoxic respiratory failure and hypothyroidism.   OT comments  Pt not making progress towards OT goals due to multiple factors: continued cognitive deficits, HR limiting OOB activity, and increased weakness/decreased activity tolerance. Pt remains motivated and when he found out therapy was there to see him he cheered "YAY" HR sustained at least 120 and higher throughout session (Pt non-symptomatic) SpO2 >95% on RA, but replaced O2 for activity due to increased HR. Pt with noticeable weakness in BUE - unable to hold toothbrush (required built up handles) and jerky movements noted. Continues to require multimodal cues for sequencing and problem solving. OT will continue  to follow acutely. Next session plan on bringing red built up handles for self-feeding and reinforce HEP for BLE and BUE (exercise band in room)   Follow Up Recommendations  SNF;LTACH    Equipment Recommendations  None recommended by OT    Recommendations for Other Services      Precautions / Restrictions Precautions Precautions: Fall Precaution Comments: wound vac, watch HR Restrictions Weight Bearing Restrictions: No       Mobility Bed Mobility Overal bed mobility: Needs Assistance Bed Mobility: Rolling;Supine to Sit;Sit to Supine Rolling: Mod assist   Supine to sit: Max assist;+2 for physical assistance;+2 for safety/equipment Sit to supine: Max assist;+2 for physical assistance;+2 for safety/equipment   General bed mobility comments: max A +2 to come EOB, required support  due to posterior lean, multimodal cues throughout - Pt does verbalized wanting to help and did use bedrail in attempt to assist    Transfers                 General transfer comment: NT today due to HR >120 throughout session    Balance Overall balance assessment: Needs assistance Sitting-balance support: Feet supported;Single extremity supported Sitting balance-Leahy Scale: Poor Sitting balance - Comments: MinA for dynamic sitting balance Postural control: Posterior lean                                 ADL either performed or assessed with clinical judgement   ADL Overall ADL's : Needs assistance/impaired Eating/Feeding: Moderate assistance;With adaptive utensils (HOB elevated; built up handles)   Grooming: Oral care;Wash/dry face;Moderate assistance;With adaptive equipment ([place] elevated) Grooming Details (indicate cue type and reason): grasp and jerky BUE movement impacting ability to perform grooming - required assist                     Toileting- Clothing Manipulation and Hygiene: Total assistance;Bed level Toileting - Clothing Manipulation Details (indicate cue type and reason): cleaned at bedlevel and butt cream applied. sores noted       General ADL Comments: decreased activity tolerance, balance, HR limiting activity     Vision   Additional Comments: macular degeneration   Perception     Praxis      Cognition Arousal/Alertness: Awake/alert Behavior During Therapy: WFL for tasks assessed/performed Overall Cognitive Status: No family/caregiver present to determine baseline cognitive functioning Area of Impairment: Memory;Orientation;Awareness;Problem  solving                 Orientation Level: Place ("[place]")   Memory: Decreased short-term memory Following Commands: Follows one step commands inconsistently;Follows one step commands with increased time   Awareness: Intellectual Problem Solving: Slow processing;Requires  verbal cues;Difficulty sequencing General Comments: multimodal cues throughout for all aspects of bed mobility and ADL care        Exercises     Shoulder Instructions       General Comments skin breakdown on buttocks (near anus) and new skin tear on R elbow (RN notified), HR greater than or equal to 120 throughout session, single episodes of VTACH noted - Cardiologist present and ok'ed EOB activity    Pertinent Vitals/ Pain       Pain Assessment: Faces Faces Pain Scale: Hurts even more Pain Location: buttocks with pericare Pain Descriptors / Indicators: Discomfort;Sore;Grimacing;Guarding Pain Intervention(s): Monitored during session;Repositioned;Other (comment) (applied but cream)  Home Living                                          Prior Functioning/Environment              Frequency  Min 2X/week        Progress Toward Goals  OT Goals(current goals can now be found in the care plan section)  Progress towards OT goals: Not progressing toward goals - comment (HR limiting factor)  Acute Rehab OT Goals Patient Stated Goal: To be able to walk again OT Goal Formulation: With patient Time For Goal Achievement: 03/02/21 Potential to Achieve Goals: [place] Discharge plan needs to be updated    Co-evaluation    PT/OT/SLP Co-Evaluation/Treatment: Yes Reason for Co-Treatment: To address functional/ADL transfers;For patient/therapist safety;Necessary to address cognition/behavior during functional activity PT goals addressed during session: Mobility/safety with mobility;Strengthening/ROM OT goals addressed during session: ADL's and self-care;Strengthening/ROM      AM-PAC OT "6 Clicks" Daily Activity     Outcome Measure   Help from another person eating meals?: A Little Help from another person taking care of personal grooming?: A Little Help from another person toileting, which includes using toliet, bedpan, or urinal?: Total Help from  another person bathing (including washing, rinsing, drying)?: A Lot Help from another person to put on and taking off regular upper body clothing?: A Lot Help from another person to put on and taking off regular lower body clothing?: Total 6 Click Score: 12    End of Session Equipment Utilized During Treatment: Oxygen (2L)  OT Visit Diagnosis: Muscle weakness (generalized) (M62.81);Pain;Other symptoms and signs involving cognitive function Pain - Right/Left: Right Pain - part of body: Leg   Activity Tolerance Treatment limited secondary to medical complications (Comment) (HR in 120's at rest)   Patient Left in bed;with call bell/phone within reach;with bed alarm set   Nurse Communication Mobility status;Other (comment) (HR, new skin tear on R elbow)        Time: [id number] OT Time Calculation (min): 32 min  Charges: OT General Charges $OT Visit: 1 Visit OT Treatments $Self Care/Home Management : 8-22 mins  Jesse Sans OTR/L Acute Rehabilitation Services Pager: [phone] Office: [place] 02/22/2021, 10:12 AM

## 2021-02-22 NOTE — Progress Notes (Signed)
Physical Therapy Treatment Patient Details Name: Kyle Walton MRN: [id number] DOB: [birth year] Today's Date: 02/22/2021    History of Present Illness Pt is a 65 year old man admitted on 02/24/2021 with non healing R thigh wound now s/p I&D on 8/11. He was admitted in July R thigh infection with hospital course complicated by post op acute respiratior failure, he discharged to SNF for rehab. PMH: ESRD, DM2, morbid obesity, CHF, PAF, chronic hypoxic respiratory failure and hypothyroidism.    PT Comments    Patient received in bed, happy to learn that PT will be working with him today. Cleared for EOB by cardiologist. HR 120-131BPM today with occasional single beats of Vtach, BP hypotensive but stable. Needs MaxAx2 for all aspects of mobility to get to/from EOB, very poor balance at EOB but motivated to improve. More tangential and cognition less clear than in prior PT sessions (well known to this therapist from prior admission). Worked on R LE calf, patella, and knee mobility today per patient request as well, reviewed exercises including SLRs, supine hip ABD, and ankle pumps. Left in bed positioned to comfort with all needs met, bed alarm active. Per chart, looks like he may potentially go to [facility] which from PT perspective would certainly be appropriate.     Follow Up Recommendations  [facility] bed;Wheelchair (measurements PT);Wheelchair cushion (measurements PT);Other (comment) (hoyer lift and pads)    Recommendations for Other Services       Precautions / Restrictions Precautions Precautions: Fall Precaution Comments: wound vac, watch HR Restrictions Weight Bearing Restrictions: No    Mobility  Bed Mobility Overal bed mobility: Needs Assistance Bed Mobility: Rolling;Supine to Sit;Sit to Supine Rolling: Mod assist   Supine to sit: Max assist;+2 for physical assistance;+2 for safety/equipment Sit to supine: Max assist;+2 for physical assistance;+2  for safety/equipment   General bed mobility comments: max A +2 to come EOB, required support due to posterior lean, multimodal cues throughout - Pt does verbalized wanting to help and did use bedrail in attempt to assist    Transfers                 General transfer comment: NT today due to HR >120 throughout session  Ambulation/Gait             General Gait Details: unable   Stairs             Wheelchair Mobility    Modified Rankin (Stroke Patients Only)       Balance Overall balance assessment: Needs assistance Sitting-balance support: Feet supported;Single extremity supported Sitting balance-Leahy Scale: Poor Sitting balance - Comments: MinA for dynamic sitting balance Postural control: Posterior lean                                  Cognition Arousal/Alertness: Awake/alert Behavior During Therapy: WFL for tasks assessed/performed Overall Cognitive Status: No family/caregiver present to determine baseline cognitive functioning Area of Impairment: Memory;Orientation;Awareness;Problem solving                 Orientation Level: Disoriented to;Place;Time;Situation ("[place]" and the year is 2042; able to tell some of what brought him to hospital but very muddled and unclear timeline) Current Attention Level: Sustained Memory: Decreased short-term memory Following Commands: Follows one step commands inconsistently;Follows one step commands with increased time Safety/Judgement: Decreased awareness of safety;Decreased awareness of deficits Awareness: Intellectual Problem Solving: Slow  processing;Requires verbal cues;Difficulty sequencing General Comments: multimodal cues throughout for all aspects of bed mobility and ADL care      Exercises      General Comments General comments (skin integrity, edema, etc.): skin breakdown on buttocks (near anus) and new skin tear on R elbow (RN notifed); HR greater than or equal to 120 (131BPM  max) during session and single occasional episodes of VTACH noted. Cardiologist present and OK'ed EOB today.      Pertinent Vitals/Pain Pain Assessment: Faces Faces Pain Scale: Hurts even more Pain Location: buttocks with pericare Pain Descriptors / Indicators: Discomfort;Sore;Grimacing;Guarding Pain Intervention(s): Limited activity within patient's tolerance;Monitored during session    Home Living                      Prior Function            PT Goals (current goals can now be found in the care plan section) Acute Rehab PT Goals Patient Stated Goal: To be able to walk again PT Goal Formulation: With patient Time For Goal Achievement: 03/02/21 Potential to Achieve Goals: Fair Progress towards PT goals: Progressing toward goals (slowly)    Frequency    Min 3X/week      PT Plan Discharge plan needs to be updated;Equipment recommendations need to be updated    Co-evaluation   Reason for Co-Treatment: To address functional/ADL transfers;For patient/therapist safety;Necessary to address cognition/behavior during functional activity PT goals addressed during session: Mobility/safety with mobility;Strengthening/ROM OT goals addressed during session: ADL's and self-care;Strengthening/ROM      AM-PAC PT "6 Clicks" Mobility   Outcome Measure  Help needed turning from your back to your side while in a flat bed without using bedrails?: A Lot Help needed moving from lying on your back to sitting on the side of a flat bed without using bedrails?: Total Help needed moving to and from a bed to a chair (including a wheelchair)?: Total Help needed standing up from a chair using your arms (e.g., wheelchair or bedside chair)?: Total Help needed to walk in hospital room?: Total Help needed climbing 3-5 steps with a railing? : Total 6 Click Score: 7    End of Session   Activity Tolerance: Patient tolerated treatment well Patient left: in bed;with call bell/phone within  reach;with bed alarm set Nurse Communication: Mobility status PT Visit Diagnosis: Muscle weakness (generalized) (M62.81);Pain;Other abnormalities of gait and mobility (R26.89);Unsteadiness on feet (R26.81) Pain - Right/Left: Right Pain - part of body: Leg     Time: 7096-2836 PT Time Calculation (min) (ACUTE ONLY): 42 min  Charges:  $Therapeutic Exercise: 8-22 mins (co-tx with OT) $Therapeutic Activity: 8-22 mins                    Windell Norfolk, DPT, PN2   Supplemental Physical Therapist [place]    Pager [phone] Acute Rehab Office [phone]

## 2021-02-22 NOTE — Progress Notes (Signed)
[facility] Progress Note   Subjective:    Seen in room. Completed dialysis yesterday. Transfused 1U prbcs on HD. Hgb improved. No sob, cp, palpitations today. Concerned about hand tremors.   Objective Vitals:   02/22/21 0438 02/22/21 0439 02/22/21 0712 02/22/21 0810  BP: 105/79  (!) 94/53   Pulse: (!) 110  (!) 123   Resp: '16  20 20  '$ Temp: 98.6 F (37 C)  (!) 97.4 F (36.3 C)   TempSrc: Oral  Oral   SpO2: 100%  98%   Weight:  109.6 kg    Height:       Physical Exam General: Chronically ill-appearing male; NAD Heart: Tachy, regular No murmurs, rubs, and gallops Lungs: Cleart throughout; No wheezing, rales, or rhonchi Abdomen: No ABD edema, soft, non-tender Extremities:  Trace LE edema. Wound vac to RLE in place-no drainage noted. Dialysis Access: L [facility]   Filed Weights   02/20/21 0326 02/22/21 0439  Weight: 106.4 kg 109.6 kg    Intake/Output Summary (Last 24 hours) at 02/22/2021 1044 Last data filed at 02/22/2021 0415 Gross per 24 hour  Intake 760 ml  Output 1000 ml  Net -240 ml     Additional Objective Labs: Basic Metabolic Panel: Recent Labs  Lab 02/18/21 0833 02/18/21 1350 02/21/21 1237  NA 135 134* 134*  K 3.7 3.3* 3.7  CL 101 97* 102  CO2 22 26 18*  GLUCOSE 156* 109* 166*  BUN 46* 19 56*  CREATININE 6.31* 3.33* 7.21*  CALCIUM 7.9* 7.8* 7.6*  PHOS 6.3* 3.1 6.5*    Liver Function Tests: Recent Labs  Lab 02/18/21 0833 02/18/21 1350 02/21/21 1237  ALBUMIN 1.8* 2.0* 1.7*    No results for input(s): LIPASE, AMYLASE in the last 168 hours. CBC: Recent Labs  Lab 02/18/21 1350 02/19/21 0043 02/20/21 0104 02/21/21 1237 02/22/21 0730  WBC 14.5* 11.7* 10.7* 13.8* 12.6*  HGB 8.4* 7.5* 7.9* 7.4* 8.9*  HCT 27.9* 25.5* 25.6* 24.9* 28.4*  MCV 93.9 93.4 93.1 93.3 91.0  PLT 229 228 186 211 229    Blood Culture    Component Value Date/Time   SDES BLOOD RIGHT ANTECUBITAL 03/08/2021 1110   SPECREQUEST  02/16/2021 1110    BOTTLES  DRAWN AEROBIC ONLY Blood Culture adequate volume   CULT  03/08/2021 1110    NO GROWTH 5 DAYS Performed at [facility], [place] [address] [place], [place] [address]    REPTSTATUS 02/12/2021 FINAL 02/19/2021 1110    Cardiac Enzymes: No results for input(s): CKTOTAL, CKMB, CKMBINDEX, TROPONINI in the last 168 hours. CBG: Recent Labs  Lab 02/21/21 0826 02/21/21 1223 02/21/21 1738 02/21/21 2125 02/22/21 0744  GLUCAP 131* 180* 142* 258* 164*    Iron Studies: No results for input(s): IRON, TIBC, TRANSFERRIN, FERRITIN in the last 72 hours. Lab Results  Component Value Date   INR 1.9 (H) 02/27/2021   INR 2.6 (H) 01/18/2021   INR 1.5 (H) 12/04/2020   Studies/Results: ECHOCARDIOGRAM COMPLETE  Result Date: 02/21/2021    ECHOCARDIOGRAM REPORT   Patient Name:   Kyle Walton Date of Exam: 02/21/2021 Medical Rec #:  [id number]     Height:       70.0 in Accession #:    [id number]    Weight:       234.6 lb Date of Birth:  Feb 14, 1956     BSA:          2.234 m Patient Age:    65 years  BP:           82/49 mmHg Patient Gender: M             HR:           95 bpm. Exam Location:  Inpatient Procedure: 2D Echo, Cardiac Doppler, Color Doppler and Intracardiac            Opacification Agent Indications:    Abn EKG  History:        Patient has no prior history of Echocardiogram examinations.                 CHF, Previous Myocardial Infarction, Defibrillator, Prior CABG,                 Prior Cardiac Surgery and Abnormal ECG, Arrythmias:Atrial                 Fibrillation, Signs/Symptoms:Chest Pain; Risk Factors:Diabetes.                 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  Sonographer:    Luisa Hart RDCS Referring Phys: [id number] BELKYS A REGALADO  Sonographer Comments: Technically challenging study due to limited acoustic windows and patient is morbidly obese. Image acquisition  challenging due to patient body habitus. IMPRESSIONS  1. Left ventricular ejection fraction, by estimation, is 30 to 35%. The left ventricle has moderately decreased function. The left ventricle demonstrates global hypokinesis. There is mild left ventricular hypertrophy. Left ventricular diastolic parameters are indeterminate.  2. Right ventricular systolic function was not well visualized. The right ventricular size is normal There is a small (< 1 cm) echodensity on the RV device lead best seen on image 16.  3. The mitral valve is abnormal. Trivial mitral valve regurgitation. No evidence of mitral stenosis. Moderate mitral annular calcification.  4. The aortic valve is tricuspid. Aortic valve regurgitation is not visualized. Mild to moderate aortic valve sclerosis/calcification is present, without any evidence of aortic stenosis.  5. The inferior vena cava is dilated in size with <50% respiratory variability, suggesting right atrial pressure of 15 mmHg. Comparison(s): No prior Echocardiogram. FINDINGS  Left Ventricle: Left ventricular ejection fraction, by estimation, is 30 to 35%. The left ventricle has moderately decreased function. The left ventricle demonstrates global hypokinesis. Definity contrast agent was given IV to delineate the left ventricular endocardial borders. The left ventricular internal cavity size was normal in size. There is mild left ventricular hypertrophy. Left ventricular diastolic parameters are indeterminate. Right Ventricle: The right ventricular size is normal. Right vetricular wall thickness was not well visualized. Right ventricular systolic function was not well visualized. Left Atrium: Left atrial size was normal in size. Right Atrium: Right atrial size was normal in size. Pericardium: There is no evidence of pericardial effusion. Mitral Valve: The mitral valve is abnormal. Moderate mitral annular calcification. Trivial mitral valve regurgitation. No evidence of mitral valve  stenosis. Tricuspid Valve: The tricuspid valve is normal in structure. Tricuspid valve regurgitation is trivial. Aortic Valve: The aortic valve is tricuspid. Aortic valve regurgitation is not visualized. Mild to moderate aortic valve sclerosis/calcification is present, without any evidence of aortic stenosis. Aortic valve mean  gradient measures 5.0 mmHg. Aortic valve peak gradient measures 8.2 mmHg. Aortic valve area, by VTI measures 1.84 cm. Pulmonic Valve: The pulmonic valve was not well visualized. Pulmonic valve regurgitation is not visualized. No evidence of pulmonic stenosis. Aorta: The aortic root is normal in size and structure. Venous: The inferior vena cava is dilated in size with less than 50% respiratory variability, suggesting right atrial pressure of 15 mmHg. IAS/Shunts: The atrial septum is grossly normal. Additional Comments: A device lead is visualized.  LEFT VENTRICLE PLAX 2D LVIDd:         4.50 cm  Diastology LVIDs:         2.70 cm  LV e' medial:    4.60 cm/s LV PW:         1.50 cm  LV E/e' medial:  29.3 LV IVS:        1.70 cm  LV e' lateral:   6.53 cm/s LVOT diam:     2.10 cm  LV E/e' lateral: 20.7 LV SV:         40 LV SV Index:   18 LVOT Area:     3.46 cm  LEFT ATRIUM             Index       RIGHT ATRIUM           Index LA Vol (A2C):   59.3 ml 26.55 ml/m RA Area:     23.50 cm LA Vol (A4C):   66.6 ml 29.82 ml/m RA Volume:   69.50 ml  31.11 ml/m LA Biplane Vol: 64.9 ml 29.06 ml/m  AORTIC VALVE                    PULMONIC VALVE AV Area (Vmax):    1.83 cm     PV Vmax:       0.76 m/s AV Area (Vmean):   1.68 cm     PV Vmean:      53.800 cm/s AV Area (VTI):     1.84 cm     PV VTI:        0.152 m AV Vmax:           143.00 cm/s  PV Peak grad:  2.3 mmHg AV Vmean:          102.000 cm/s PV Mean grad:  1.0 mmHg AV VTI:            0.218 m AV Peak Grad:      8.2 mmHg AV Mean Grad:      5.0 mmHg LVOT Vmax:         75.50 cm/s LVOT Vmean:        49.500 cm/s LVOT VTI:          0.116 m LVOT/AV VTI ratio:  0.53  AORTA Ao Root diam: 2.90 cm Ao Asc diam:  2.50 cm MITRAL VALVE                TRICUSPID VALVE MV Area (PHT): 4.01 cm     TR Peak grad:   45.4 mmHg MV Decel Time: 189 msec     TR Vmax:        337.00 cm/s MV E velocity: 135.00 cm/s                             SHUNTS  Systemic VTI:  0.12 m                             Systemic Diam: 2.10 cm Rudean Haskell MD Electronically signed by Rudean Haskell MD Signature Date/Time: 02/21/2021/3:39:54 PM    Final     Medications:  ceFEPime (MAXIPIME) IV     lactated ringers Stopped (02/21/21 1532)    (feeding supplement) PROSource Plus  30 mL Oral BID BM   amiodarone  200 mg Oral BID   apixaban  5 mg Oral BID   atorvastatin  40 mg Oral QPM   chlorhexidine  15 mL Mouth Rinse BID   Chlorhexidine Gluconate Cloth  6 each Topical Q0600   cholestyramine light  4 g Oral BID   clopidogrel  75 mg Oral Daily   [START ON 02/23/2021] darbepoetin (ARANESP) injection - DIALYSIS  200 mcg Intravenous Q Wed-HD   docusate sodium  100 mg Oral BID   fentaNYL  1 patch Transdermal Q72H   ferric citrate  420 mg Oral BID WC   Gerhardt's butt cream   Topical TID   insulin aspart  0-5 Units Subcutaneous QHS   insulin aspart  0-9 Units Subcutaneous TID WC   insulin glargine-yfgn  5 Units Subcutaneous QHS   latanoprost  1 drop Both Eyes QHS   levothyroxine  25 mcg Oral QAC breakfast   mouth rinse  15 mL Mouth Rinse q12n4p   metoprolol succinate  12.5 mg Oral Daily   midodrine  10 mg Oral TID WC   multivitamin  1 tablet Oral QHS   pantoprazole  40 mg Oral Q0600   povidone-iodine  2 application Topical Once   pregabalin  25 mg Oral BID   saccharomyces boulardii  250 mg Oral BID    Dialysis Orders: [facility]: MWF 4 hrs F250 450/800 3.0K/2.5 Ca LIJ TDC  EDW 111 kg -Heparin 4900 units IV initial bolus, 500 units hourly, DC last hour of tx  Assessment/Plan: R. Thigh Abscess: s/p I&D on 8/11. Now with wound Vac. Per primary. Continue  cefepime with HD until 03/03/21  ESRD - On HD MWF.  Next HD 8/17  Hypertension/volume-Have attempted to challenge and lower EDW since admit. Using Midodrine with extra 10 mg PO mid run to offset intradialyic hypotension. Continue lowering volume as tolerated. Anemia  - Hgb 7-8s. Transfused 1 U prbc on 8/15.  Aranesp increased to 200 q Wed starting 8/17.  Metabolic bone disease - Corr Ca/Phos ok. Continue Auryxia 2 tab PO BID  Nutrition - Renal/Carb mod diet with protein supps, renal vits. DMT2-per primary Hypothyroidism-per primary AFIB: On amiodarone, apixaban and low-dose metoprolol and was scheduled for cardioversion but this has not happened yet. Seen by Dr. Angelena Form 02/16/2021. Needs uninterrupted anticoagulation for 3 weeks then DCCV.   9. Disposition: Awaiting new SNF placement.  [name] PA-C [facility] 02/22/2021,10:44 AM

## 2021-02-23 ENCOUNTER — Inpatient Hospital Stay (HOSPITAL_COMMUNITY): Payer: Medicare HMO

## 2021-02-23 DIAGNOSIS — I471 Supraventricular tachycardia: Secondary | ICD-10-CM

## 2021-02-23 DIAGNOSIS — I2510 Atherosclerotic heart disease of native coronary artery without angina pectoris: Secondary | ICD-10-CM | POA: Diagnosis not present

## 2021-02-23 DIAGNOSIS — I480 Paroxysmal atrial fibrillation: Secondary | ICD-10-CM

## 2021-02-23 DIAGNOSIS — N186 End stage renal disease: Secondary | ICD-10-CM | POA: Diagnosis not present

## 2021-02-23 DIAGNOSIS — I255 Ischemic cardiomyopathy: Secondary | ICD-10-CM | POA: Diagnosis not present

## 2021-02-23 LAB — CBC
HCT: 26.9 % — ABNORMAL LOW (ref 39.0–52.0)
Hemoglobin: 8.2 g/dL — ABNORMAL LOW (ref 13.0–17.0)
MCH: 28.6 pg (ref 26.0–34.0)
MCHC: 30.5 g/dL (ref 30.0–36.0)
MCV: 93.7 fL (ref 80.0–100.0)
Platelets: 228 10*3/uL (ref 150–400)
RBC: 2.87 MIL/uL — ABNORMAL LOW (ref 4.22–5.81)
RDW: 19.6 % — ABNORMAL HIGH (ref 11.5–15.5)
WBC: 12.7 10*3/uL — ABNORMAL HIGH (ref 4.0–10.5)
nRBC: 0 % (ref 0.0–0.2)

## 2021-02-23 LAB — RENAL FUNCTION PANEL
Albumin: 1.6 g/dL — ABNORMAL LOW (ref 3.5–5.0)
Anion gap: 13 (ref 5–15)
BUN: 46 mg/dL — ABNORMAL HIGH (ref 8–23)
CO2: 22 mmol/L (ref 22–32)
Calcium: 7.7 mg/dL — ABNORMAL LOW (ref 8.9–10.3)
Chloride: 100 mmol/L (ref 98–111)
Creatinine, Ser: 5.75 mg/dL — ABNORMAL HIGH (ref 0.61–1.24)
GFR, Estimated: 10 mL/min — ABNORMAL LOW (ref >60)
Glucose, Bld: 214 mg/dL — ABNORMAL HIGH (ref 70–99)
Phosphorus: 5.6 mg/dL — ABNORMAL HIGH (ref 2.5–4.6)
Potassium: 3.6 mmol/L (ref 3.5–5.1)
Sodium: 135 mmol/L (ref 135–145)

## 2021-02-23 LAB — GLUCOSE, CAPILLARY
Glucose-Capillary: 160 mg/dL — ABNORMAL HIGH (ref 70–99)
Glucose-Capillary: 127 mg/dL — ABNORMAL HIGH (ref 70–99)
Glucose-Capillary: 148 mg/dL — ABNORMAL HIGH (ref 70–99)

## 2021-02-23 IMAGING — CT CT HEART MORPH/PULM VEIN W/ CM & W/O CA SCORE
2 of 5 series · 10 of 20 positions shown, 12 images · IV contrast (isovue)
Comparison: None
COMPARISON: None.
COMPARISON: None

Addendum:
CLINICAL DATA: Pre Cardioversion

EXAM:
Cardiac Gated CTA
TECHNIQUE: The patient was scanned on a Siemens Force [REDACTED]ice scanner. Gantry
rotation speed was 250 msec with a temporal resolution of 66 msec. A
prospective scan was triggered in the ascending thoracic aorta at
140 HU's Data sets were reconstructed with full mA between 35% and
75% of the R-R interval Images were reviewed using VRT, MIP and MPR
modes. Double oblique images were used to measure the PV diameter
and areas. The patient received 80 cc of contrast at 5 cc/sec
CONTRAST:  Isovue 370 total 80 cc

[Series 6: best syst · axial · 0.46mm/px · z∈[+1360,+1426]mm · 6 of 234 slices shown, 8 images]
[im 34/234  vessel]
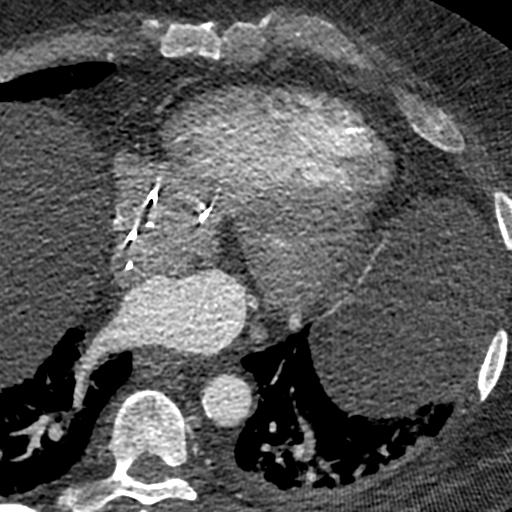
[im 34/234  lung]
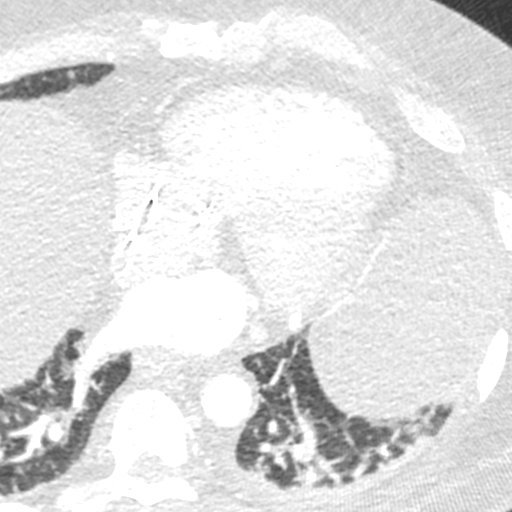
[im 67/234  vessel]
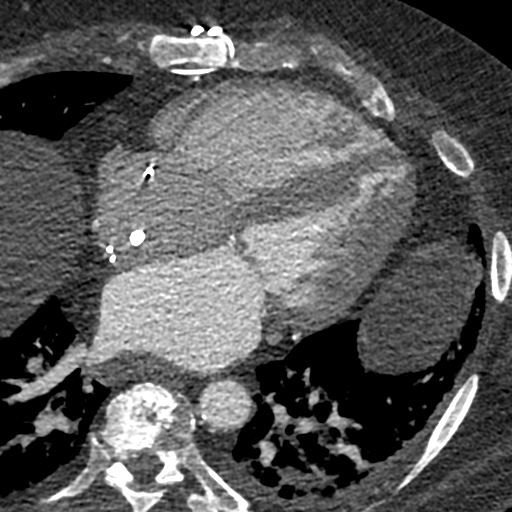
[im 100/234  vessel]
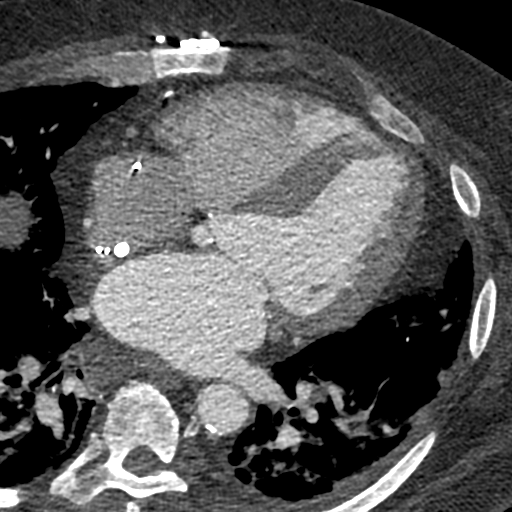
[im 134/234  vessel]
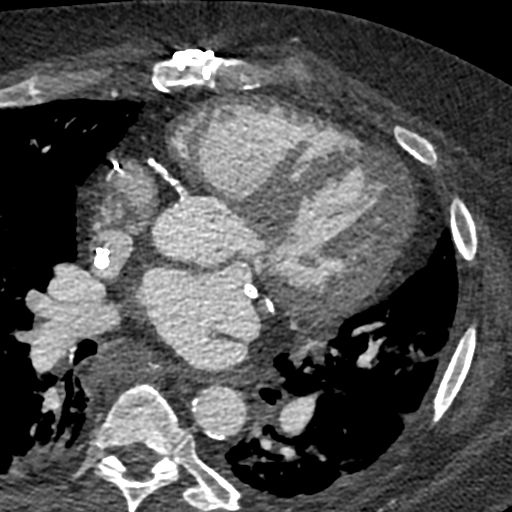
[im 167/234  vessel]
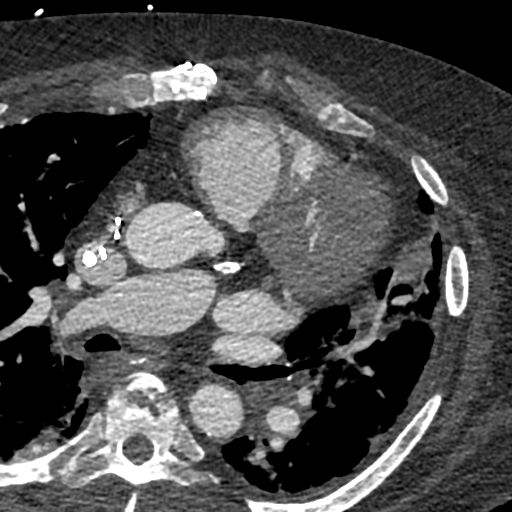
[im 167/234  lung]
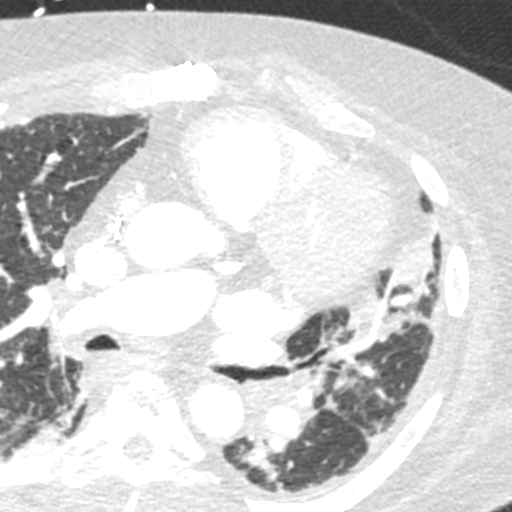
[im 200/234  vessel]
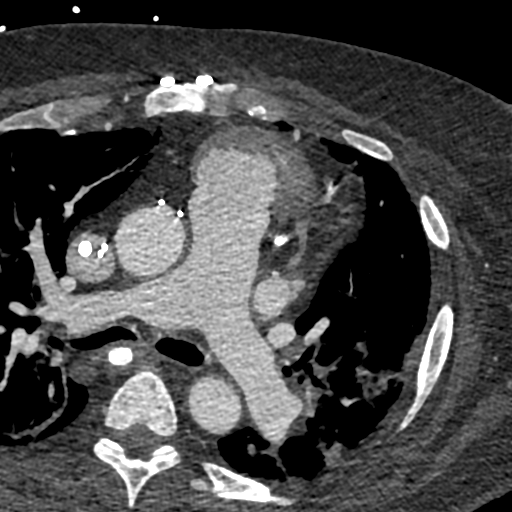

[Series 10: pv delay · axial · portal-venous · 0.44mm/px · z∈[+1376,+1424]mm · 4 of 160 slices shown]
[im 32/160  vessel]
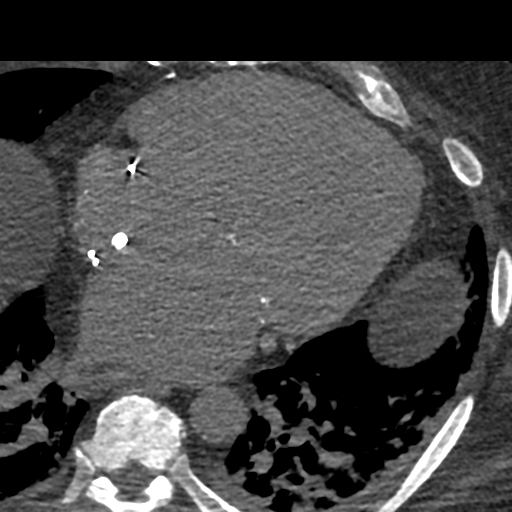
[im 64/160  vessel]
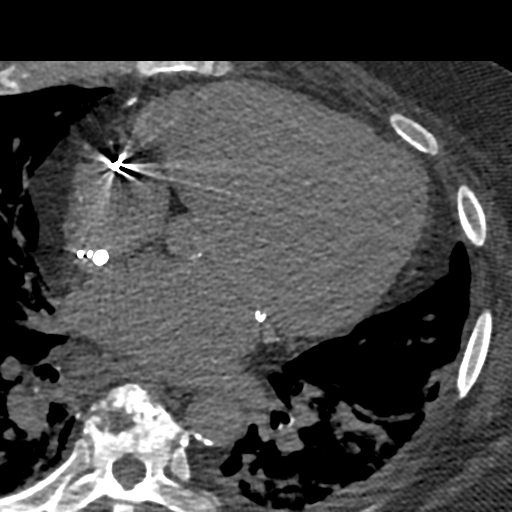
[im 96/160  vessel]
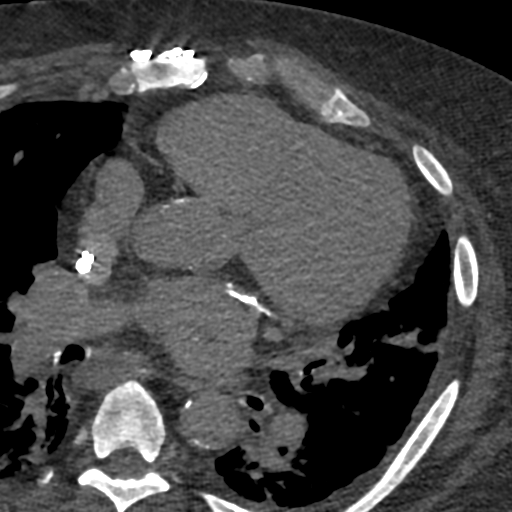
[im 128/160  vessel]
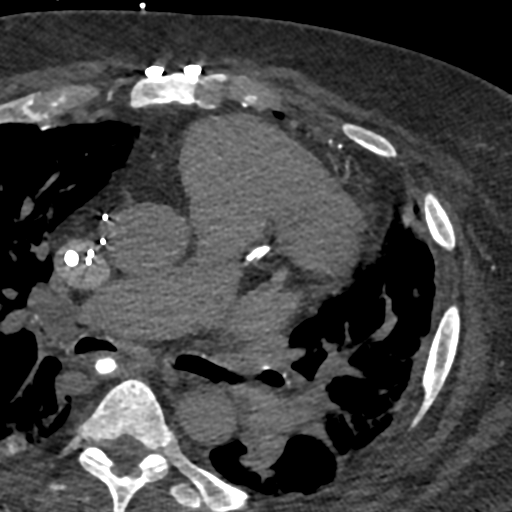

[10 of 20 positions shown; findings below may reference images not displayed]

FINDINGS: Calcium score not done previous CABG

[REDACTED] patent to COSTANDACHI

COSTANDACHI patent to PDA

COSTANDACHI patent to OM

AICD wires noted in RA/RV

Mild LAE Normal RA No PFO/ASD No pericardial effusion

COSTANDACHI large with Broccoli type morphology No COSTANDACHI thrombus

LUPV:  Ostium 14 mm

LLPV:   Ostium 16.7 mm

RUPV:  Ostium 16.3 mm

RLPV:  Ostium 17.2 mm
IMPRESSION: 1.  Mild LAE Normal RA No COSTANDACHI thrombus

2.  Patent COSTANDACHI;COSTANDACHI to PDA/OM Patent [REDACTED] to LAD

3.  No ASD/PFO

4.  Normal PV anatomy

5.  AICD wires noted in RA/RV

6.  Normal aortic root 3.2 cm

EXAM:
OVER-READ INTERPRETATION  CT CHEST

The following report is an over-read performed by radiologist Dr.
over-read does not include interpretation of cardiac or coronary
anatomy or pathology. The coronary CTA interpretation by the
cardiologist is attached.
FINDINGS: Limited view of the lung parenchyma demonstrates bibasilar
atelectasis COSTANDACHI effusions. Airways are normal.

Limited view of the mediastinum demonstrates no adenopathy.

Limited view of the upper abdomen demonstrates small amount of
high-density material within the esophagus which would indicate oral
contrast.

Limited view of the skeleton and chest wall is unremarkable.
IMPRESSION: 1. Moderate basilar atelectasis trace effusions.
2. New simple intraperitoneal free fluid.
3. High-density material within the esophagus suggest oral contrast.
Recommend clinical correlation.

These results will be called to the ordering clinician or
representative by the Radiologist Assistant, and communication
documented in the PACS or [REDACTED].

*** End of Addendum ***
FINDINGS: Calcium score not done previous CABG

[REDACTED] patent to COSTANDACHI

COSTANDACHI patent to PDA

COSTANDACHI patent to OM

AICD wires noted in RA/RV

Mild LAE Normal RA No PFO/ASD No pericardial effusion

COSTANDACHI large with Broccoli type morphology No COSTANDACHI thrombus

LUPV:  Ostium 14 mm

LLPV:   Ostium 16.7 mm

RUPV:  Ostium 16.3 mm

RLPV:  Ostium 17.2 mm
IMPRESSION: 1.  Mild LAE Normal RA No COSTANDACHI thrombus

2.  Patent COSTANDACHI;COSTANDACHI to PDA/OM Patent [REDACTED] to LAD

3.  No ASD/PFO

4.  Normal PV anatomy

5.  AICD wires noted in RA/RV

6.  Normal aortic root 3.2 cm

## 2021-02-23 MED ORDER — LIDOCAINE-PRILOCAINE 2.5-2.5 % EX CREA: 1.0000 "application " | TOPICAL_CREAM | CUTANEOUS | Status: DC | PRN

## 2021-02-23 MED ORDER — PENTAFLUOROPROP-TETRAFLUOROETH EX AERO: 1.0000 "application " | INHALATION_SPRAY | CUTANEOUS | Status: DC | PRN

## 2021-02-23 MED ORDER — SODIUM CHLORIDE 0.9 % IV SOLN: 100.0000 mL | INTRAVENOUS | Status: DC | PRN

## 2021-02-23 MED ORDER — ALTEPLASE 2 MG IJ SOLR: 2.0000 mg | Freq: Once | INTRAMUSCULAR | Status: DC | PRN

## 2021-02-23 MED ORDER — HEPARIN SODIUM (PORCINE) 1000 UNIT/ML IJ SOLN
INTRAMUSCULAR | Status: AC
  Filled 2021-02-23: qty 5

## 2021-02-23 MED ORDER — IOHEXOL 350 MG/ML SOLN
80.0000 mL | Freq: Once | INTRAVENOUS | Status: AC | PRN
  Administered 2021-02-23: 80 mL via INTRAVENOUS

## 2021-02-23 MED ORDER — LIDOCAINE HCL (PF) 1 % IJ SOLN: 5.0000 mL | INTRAMUSCULAR | Status: DC | PRN

## 2021-02-23 MED ORDER — HEPARIN SODIUM (PORCINE) 1000 UNIT/ML DIALYSIS: 1000.0000 [IU] | INTRAMUSCULAR | Status: DC | PRN

## 2021-02-23 MED ORDER — ALBUMIN HUMAN 25 % IV SOLN: 12.5000 g | Freq: Once | INTRAVENOUS | Status: AC

## 2021-02-23 MED ORDER — MIDODRINE HCL 5 MG PO TABS
ORAL_TABLET | ORAL | Status: AC
  Filled 2021-02-23: qty 1

## 2021-02-23 MED ORDER — ALBUMIN HUMAN 25 % IV SOLN
INTRAVENOUS | Status: AC
  Administered 2021-02-23: 12.5 g
  Filled 2021-02-23: qty 50

## 2021-02-23 MED ORDER — DARBEPOETIN ALFA 200 MCG/0.4ML IJ SOSY
PREFILLED_SYRINGE | INTRAMUSCULAR | Status: AC
  Administered 2021-02-23: 200 ug via INTRAVENOUS
  Filled 2021-02-23: qty 0.4

## 2021-02-23 MED ORDER — ALBUMIN HUMAN 25 % IV SOLN
INTRAVENOUS | Status: AC
  Filled 2021-02-23: qty 50

## 2021-02-23 NOTE — Progress Notes (Signed)
PT Cancellation Note  Patient Details Name: Kyle Walton MRN: [id number] DOB: 07-Jul-1956   Cancelled Treatment:    Reason Eval/Treat Not Completed: Patient at procedure or test/unavailable at HD- will attempt later if time/schedule allow.   Windell Norfolk, DPT, PN2   Supplemental Physical Therapist [place]    Pager [phone] Acute Rehab Office [phone]

## 2021-02-23 NOTE — Procedures (Signed)
Patient seen and examined on Hemodialysis. BP 96/72   Pulse 72   Temp 97.7 F (36.5 C) (Oral)   Resp 15   Ht '5\' 10"'$  (1.778 m)   Wt 105.4 kg   SpO2 98%   BMI 33.34 kg/m   QB 400 mL/ min , UF goal 2L  Hypotension improving with midodrine and albumin.  Sat probe not working- replaced, 100% on 2L   [place] Pgr [phone] 1:21 PM

## 2021-02-23 NOTE — Progress Notes (Signed)
   02/23/21 0501  Assess: MEWS Score  Temp 98.8 F (37.1 C)  BP (!) 85/53  Pulse Rate (!) 114  ECG Heart Rate (!) 114  Resp 16  SpO2 97 %  O2 Device Nasal Cannula  O2 Flow Rate (L/min) 2 L/min  Assess: MEWS Score  MEWS Temp 0  MEWS Systolic 1  MEWS Pulse 2  MEWS RR 0  MEWS LOC 0  MEWS Score 3  MEWS Score Color Yellow  Assess: if the MEWS score is Yellow or Red  Were vital signs taken at a resting state? Yes  Focused Assessment No change from prior assessment  Early Detection of Sepsis Score *See Row Information* Low  MEWS guidelines implemented *See Row Information* Yes  Treat  MEWS Interventions Administered scheduled meds/treatments (amiodarone for HR)  Pain Scale 0-10  Pain Score 0  Take Vital Signs  Increase Vital Sign Frequency  Yellow: Q 2hr X 2 then Q 4hr X 2, if remains yellow, continue Q 4hrs  Escalate  MEWS: Escalate Yellow: discuss with charge nurse/RN and consider discussing with provider and RRT  Notify: Charge Nurse/RN  Name of Charge Nurse/RN Notified Chris, RN  Date Charge Nurse/RN Notified 02/23/21  Time Charge Nurse/RN Notified 0505  Notify: Provider  Provider Name/Title Dr. Nevada Crane  Date Provider Notified 02/23/21  Time Provider Notified 0500  Notification Type Page  Notification Reason Other (Comment) (HR & BP)  Provider response No new orders  Date of Provider Response 02/23/21  Time of Provider Response [id number]  Document  Patient Outcome Other (Comment) (Stable/asymptomatic)  Progress note created (see row info) Yes

## 2021-02-23 NOTE — Progress Notes (Signed)
[facility] Progress Note   Subjective:    Seen in room. Wound Vac off. Up with PT yesterday. No complaints. Denies cp, sob.  Dialysis today.    Objective Vitals:   02/23/21 0501 02/23/21 0703 02/23/21 0828 02/23/21 0859  BP: (!) 85/53 101/71 95/64 (!) 85/59  Pulse: (!) 114 (!) 115 (!) 116 (!) 118  Resp: '16 14 19 20  '$ Temp: 98.8 F (37.1 C) 98.8 F (37.1 C) 97.9 F (36.6 C) 97.7 F (36.5 C)  TempSrc: Oral Oral Oral Oral  SpO2: 97% 94% 95% 94%  Weight:      Height:       Physical Exam General: Chronically ill-appearing male; NAD Heart: Tachy, regular No murmurs, rubs, and gallops Lungs: Cleart throughout; No wheezing, rales, or rhonchi Abdomen: No ABD edema, soft, non-tender Extremities:  Trace LE edema. Wound vac to RLE in place-no drainage noted. Dialysis Access: [facility]   Filed Weights   02/22/21 0439 02/23/21 0500  Weight: 109.6 kg 109.1 kg    Intake/Output Summary (Last 24 hours) at 02/23/2021 1040 Last data filed at 02/23/2021 0400 Gross per 24 hour  Intake 369.97 ml  Output --  Net 369.97 ml     Additional Objective Labs: Basic Metabolic Panel: Recent Labs  Lab 02/18/21 0833 02/18/21 1350 02/21/21 1237  NA 135 134* 134*  K 3.7 3.3* 3.7  CL 101 97* 102  CO2 22 26 18*  GLUCOSE 156* 109* 166*  BUN 46* 19 56*  CREATININE 6.31* 3.33* 7.21*  CALCIUM 7.9* 7.8* 7.6*  PHOS 6.3* 3.1 6.5*    Liver Function Tests: Recent Labs  Lab 02/18/21 0833 02/18/21 1350 02/21/21 1237  ALBUMIN 1.8* 2.0* 1.7*    No results for input(s): LIPASE, AMYLASE in the last 168 hours. CBC: Recent Labs  Lab 02/18/21 1350 02/19/21 0043 02/20/21 0104 02/21/21 1237 02/22/21 0730  WBC 14.5* 11.7* 10.7* 13.8* 12.6*  HGB 8.4* 7.5* 7.9* 7.4* 8.9*  HCT 27.9* 25.5* 25.6* 24.9* 28.4*  MCV 93.9 93.4 93.1 93.3 91.0  PLT 229 228 186 211 229    Blood Culture    Component Value Date/Time   SDES BLOOD RIGHT ANTECUBITAL 02/22/2021 1110   SPECREQUEST   02/12/2021 1110    BOTTLES DRAWN AEROBIC ONLY Blood Culture adequate volume   CULT  02/27/2021 1110    NO GROWTH 5 DAYS Performed at [facility], [place] [address], [place], [place] [address]    REPTSTATUS 02/12/2021 FINAL 03/02/2021 1110    Cardiac Enzymes: No results for input(s): CKTOTAL, CKMB, CKMBINDEX, TROPONINI in the last 168 hours. CBG: Recent Labs  Lab 02/22/21 0744 02/22/21 1147 02/22/21 1636 02/22/21 2138 02/23/21 0812  GLUCAP 164* 159* 179* 156* 160*    Iron Studies: No results for input(s): IRON, TIBC, TRANSFERRIN, FERRITIN in the last 72 hours. Lab Results  Component Value Date   INR 1.9 (H) 03/09/2021   INR 2.6 (H) 01/18/2021   INR 1.5 (H) 12/04/2020   Studies/Results: ECHOCARDIOGRAM COMPLETE  Result Date: 02/21/2021    ECHOCARDIOGRAM REPORT   Patient Name:   Kyle Walton Date of Exam: 02/21/2021 Medical Rec #:  [id number]     Height:       70.0 in Accession #:    [id number]    Weight:       234.6 lb Date of Birth:  31-Oct-1955     BSA:          2.234 m Patient Age:    65 years  BP:           82/49 mmHg Patient Gender: M             HR:           95 bpm. Exam Location:  Inpatient Procedure: 2D Echo, Cardiac Doppler, Color Doppler and Intracardiac            Opacification Agent Indications:    Abn EKG  History:        Patient has no prior history of Echocardiogram examinations.                 CHF, Previous Myocardial Infarction, Defibrillator, Prior CABG,                 Prior Cardiac Surgery and Abnormal ECG, Arrythmias:Atrial                 Fibrillation, Signs/Symptoms:Chest Pain; Risk Factors:Diabetes.                 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  Sonographer:    Luisa Hart RDCS Referring Phys: [id number] BELKYS A REGALADO  Sonographer Comments: Technically challenging study due to limited acoustic windows and patient is morbidly  obese. Image acquisition challenging due to patient body habitus. IMPRESSIONS  1. Left ventricular ejection fraction, by estimation, is 30 to 35%. The left ventricle has moderately decreased function. The left ventricle demonstrates global hypokinesis. There is mild left ventricular hypertrophy. Left ventricular diastolic parameters are indeterminate.  2. Right ventricular systolic function was not well visualized. The right ventricular size is normal There is a small (< 1 cm) echodensity on the RV device lead best seen on image 16.  3. The mitral valve is abnormal. Trivial mitral valve regurgitation. No evidence of mitral stenosis. Moderate mitral annular calcification.  4. The aortic valve is tricuspid. Aortic valve regurgitation is not visualized. Mild to moderate aortic valve sclerosis/calcification is present, without any evidence of aortic stenosis.  5. The inferior vena cava is dilated in size with <50% respiratory variability, suggesting right atrial pressure of 15 mmHg. Comparison(s): No prior Echocardiogram. FINDINGS  Left Ventricle: Left ventricular ejection fraction, by estimation, is 30 to 35%. The left ventricle has moderately decreased function. The left ventricle demonstrates global hypokinesis. Definity contrast agent was given IV to delineate the left ventricular endocardial borders. The left ventricular internal cavity size was normal in size. There is mild left ventricular hypertrophy. Left ventricular diastolic parameters are indeterminate. Right Ventricle: The right ventricular size is normal. Right vetricular wall thickness was not well visualized. Right ventricular systolic function was not well visualized. Left Atrium: Left atrial size was normal in size. Right Atrium: Right atrial size was normal in size. Pericardium: There is no evidence of pericardial effusion. Mitral Valve: The mitral valve is abnormal. Moderate mitral annular calcification. Trivial mitral valve regurgitation. No  evidence of mitral valve stenosis. Tricuspid Valve: The tricuspid valve is normal in structure. Tricuspid valve regurgitation is trivial. Aortic Valve: The aortic valve is tricuspid. Aortic valve regurgitation is not visualized. Mild to moderate aortic valve sclerosis/calcification is present, without any evidence of aortic stenosis. Aortic valve mean  gradient measures 5.0 mmHg. Aortic valve peak gradient measures 8.2 mmHg. Aortic valve area, by VTI measures 1.84 cm. Pulmonic Valve: The pulmonic valve was not well visualized. Pulmonic valve regurgitation is not visualized. No evidence of pulmonic stenosis. Aorta: The aortic root is normal in size and structure. Venous: The inferior vena cava is dilated in size with less than 50% respiratory variability, suggesting right atrial pressure of 15 mmHg. IAS/Shunts: The atrial septum is grossly normal. Additional Comments: A device lead is visualized.  LEFT VENTRICLE PLAX 2D LVIDd:         4.50 cm  Diastology LVIDs:         2.70 cm  LV e' medial:    4.60 cm/s LV PW:         1.50 cm  LV E/e' medial:  29.3 LV IVS:        1.70 cm  LV e' lateral:   6.53 cm/s LVOT diam:     2.10 cm  LV E/e' lateral: 20.7 LV SV:         40 LV SV Index:   18 LVOT Area:     3.46 cm  LEFT ATRIUM             Index       RIGHT ATRIUM           Index LA Vol (A2C):   59.3 ml 26.55 ml/m RA Area:     23.50 cm LA Vol (A4C):   66.6 ml 29.82 ml/m RA Volume:   69.50 ml  31.11 ml/m LA Biplane Vol: 64.9 ml 29.06 ml/m  AORTIC VALVE                    PULMONIC VALVE AV Area (Vmax):    1.83 cm     PV Vmax:       0.76 m/s AV Area (Vmean):   1.68 cm     PV Vmean:      53.800 cm/s AV Area (VTI):     1.84 cm     PV VTI:        0.152 m AV Vmax:           143.00 cm/s  PV Peak grad:  2.3 mmHg AV Vmean:          102.000 cm/s PV Mean grad:  1.0 mmHg AV VTI:            0.218 m AV Peak Grad:      8.2 mmHg AV Mean Grad:      5.0 mmHg LVOT Vmax:         75.50 cm/s LVOT Vmean:        49.500 cm/s LVOT VTI:           0.116 m LVOT/AV VTI ratio: 0.53  AORTA Ao Root diam: 2.90 cm Ao Asc diam:  2.50 cm MITRAL VALVE                TRICUSPID VALVE MV Area (PHT): 4.01 cm     TR Peak grad:   45.4 mmHg MV Decel Time: 189 msec     TR Vmax:        337.00 cm/s MV E velocity: 135.00 cm/s                             SHUNTS  Systemic VTI:  0.12 m                             Systemic Diam: 2.10 cm Rudean Haskell MD Electronically signed by Rudean Haskell MD Signature Date/Time: 02/21/2021/3:39:54 PM    Final     Medications:  ceFEPime (MAXIPIME) IV 1 g (02/22/21 1729)   lactated ringers Stopped (02/21/21 1532)    (feeding supplement) PROSource Plus  30 mL Oral BID BM   amiodarone  200 mg Oral BID   apixaban  5 mg Oral BID   atorvastatin  40 mg Oral QPM   chlorhexidine  15 mL Mouth Rinse BID   Chlorhexidine Gluconate Cloth  6 each Topical Q0600   cholestyramine light  4 g Oral BID   clopidogrel  75 mg Oral Daily   darbepoetin (ARANESP) injection - DIALYSIS  200 mcg Intravenous Q Wed-HD   docusate sodium  100 mg Oral BID   feeding supplement (NEPRO CARB STEADY)  237 mL Oral Q24H   fentaNYL  1 patch Transdermal Q72H   ferric citrate  420 mg Oral BID WC   Gerhardt's butt cream   Topical TID   insulin aspart  0-5 Units Subcutaneous QHS   insulin aspart  0-9 Units Subcutaneous TID WC   insulin glargine-yfgn  5 Units Subcutaneous QHS   latanoprost  1 drop Both Eyes QHS   levothyroxine  25 mcg Oral QAC breakfast   mouth rinse  15 mL Mouth Rinse q12n4p   metoprolol succinate  12.5 mg Oral Daily   midodrine  10 mg Oral TID WC   multivitamin  1 tablet Oral QHS   pantoprazole  40 mg Oral Q0600   pregabalin  25 mg Oral BID   saccharomyces boulardii  250 mg Oral BID    Dialysis Orders: [facility]: MWF 4 hrs F250 450/800 3.0K/2.5 Ca LIJ TDC  EDW 111 kg -Heparin 4900 units IV initial bolus, 500 units hourly, DC last hour of tx  Assessment/Plan: R. Thigh Abscess: s/p I&D on 8/11. Wound  Vac off. Per primary. Continue cefepime with HD until 03/03/21  ESRD - On HD MWF.  Next HD 8/17  Hypertension/volume-Have attempted to challenge and lower EDW since admit. Using Midodrine with extra 10 mg PO mid run to offset intradialyic hypotension. Continue lowering volume as tolerated. Anemia  - Hgb 7-8s. Transfused 1 U prbc on 8/15.  Aranesp increased to 200 q Wed starting 8/17.  Metabolic bone disease - Corr Ca/Phos ok. Continue Auryxia 2 tab PO BID  Nutrition - Renal/Carb mod diet with protein supps, renal vits. DMT2-per primary Hypothyroidism-per primary AFIB: On amiodarone, apixaban and low-dose metoprolol and was scheduled for cardioversion but this has not happened yet. Seen by Dr. Angelena Form 02/16/2021. Needs uninterrupted anticoagulation for 3 weeks then DCCV.   9. Disposition: Pending   [name] PA-C [facility] 02/23/2021,10:40 AM

## 2021-02-23 NOTE — Progress Notes (Signed)
PROGRESS NOTE    Kyle Walton  [id number] DOB: February 23, 1956 DOA: 02/24/2021 PCP: Patient, No Pcp Per (Inactive)   Brief Narrative: 65 chronically ill with ESRD, anemia of chronic disease, type 2 diabetes, chronic diastolic heart failure, paroxysmal A. fib on Eliquis, COPD chronic hypoxic respiratory failure on 2 L of oxygen, nocturnal BiPAP, hypothyroidism recently discharged from [facility] 7/26-after he was treated for right thigh abscess-he had undergone I&D on 7/14.  He was discharged to SNF on oral antimicrobial therapy-while at SNF-he was noted to have recurrence of his right thigh abscess-hence he was admitted to the hospitalist service.  He was started on broad-spectrum antimicrobial therapy-and underwent repeat I&D on 8/11.  He was evaluated by infectious disease with recommendations to continue with cefepime for 2 weeks with HD until 8/25.  See below for further details.   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  Per Ortho-sutures need to be maintained until 3 weeks postoperative (around 9/1).  PAF/flutter with episodes of RVR: Cardiology following-remains on oral amiodarone/beta-blocker and Eliquis.  Cardiology planning on a cardiac CT to rule out a LAA thrombus before proceeding with a cardioversion.  Chronic systolic heart failure (EF 30-35% by echo on 8/15): Volume status stable-diuresis with HD-has chronic hypotension-and can only tolerate low-dose metoprolol.  Chronic hypotension: Continue midodrine.  ESRD on HD TTS: Nephrology following and directing care.  Anemia: Related to ESRD and acute illness-s/p PRBC transfusion on 8/15-monitor hemoglobin.  On Epogen per nephrology.  History of CAD s/p CABG: Continue  Plavix/beta-blocker/statin.  Chronic hypoxic respiratory failure/OSA: Continue BiPAP nightly.  Hypothyroidism: Continue levothyroxine  DM-2 (A1c 7.2 on 8/1): CBG's stable-continue Lantus 5 units nightly and SSI.  Follow and adjust  Recent Labs    02/22/21 1636 02/22/21 2138 02/23/21 0812  GLUCAP 179* 156* 160*    Legally blind  History of PAD-s/p right transmetatarsal amputation  Obesity: Estimated body mass index is 34.51 kg/m as calculated from the following:   Height as of this encounter: '5\' 10"'$  (1.778 m).   Weight as of this encounter: 109.1 kg.   DVT prophylaxis: Eliquis Code Status: Full Code Family Communication: None at bedside-we will discuss with family over the next few days. Disposition Plan:  Status is: Inpatient  Remains inpatient appropriate because:IV treatments appropriate due to intensity of illness or inability to take PO  Dispo: The patient is from: SNF              Anticipated d/c is to: SNF vs LTACH              Patient currently is not medically stable to d/c.   Difficult to place patient No   Consultants:  Ortho Nephrology Cardiology  Procedures:  I and D   Antimicrobials:    Subjective: Awake/alert-no chest pain or shortness of breath.  Objective: Vitals:   02/23/21 0501 02/23/21 0703 02/23/21 0828 02/23/21 0859  BP: (!) 85/53 101/71 95/64 (!) 85/59  Pulse: (!) 114 (!) 115 (!) 116 (!) 118  Resp: '16 14 19 20  '$ Temp: 98.8 F (37.1 C) 98.8 F (37.1 C) 97.9 F (36.6 C) 97.7 F (36.5 C)  TempSrc: Oral Oral Oral Oral  SpO2: 97% 94% 95% 94%  Weight:      Height:        Intake/Output  Summary (Last 24 hours) at 02/23/2021 1020 Last data filed at 02/23/2021 0400 Gross per 24 hour  Intake 369.97 ml  Output --  Net 369.97 ml    Filed Weights   02/22/21 0439 02/23/21 0500  Weight: 109.6 kg 109.1 kg    Examination: Gen Exam:Alert awake-not in any distress HEENT:atraumatic, normocephalic Chest: B/L clear to auscultation  anteriorly CVS:S1S2 regular Abdomen:soft non tender, non distended Extremities:no edema Neurology: Non focal Skin: no rash    Data Reviewed: I have personally reviewed following labs and imaging studies  CBC: Recent Labs  Lab 02/18/21 1350 02/19/21 0043 02/20/21 0104 02/21/21 1237 02/22/21 0730  WBC 14.5* 11.7* 10.7* 13.8* 12.6*  HGB 8.4* 7.5* 7.9* 7.4* 8.9*  HCT 27.9* 25.5* 25.6* 24.9* 28.4*  MCV 93.9 93.4 93.1 93.3 91.0  PLT 229 228 186 211 [id number]    Basic Metabolic Panel: Recent Labs  Lab 02/13/2021 0047 02/18/21 0833 02/18/21 1350 02/21/21 1237 02/22/21 0730  NA 134* 135 134* 134*  --   K 3.4* 3.7 3.3* 3.7  --   CL 102 101 97* 102  --   CO2 19* 22 26 18*  --   GLUCOSE 110* 156* 109* 166*  --   BUN 33* 46* 19 56*  --   CREATININE 4.85* 6.31* 3.33* 7.21*  --   CALCIUM 7.7* 7.9* 7.8* 7.6*  --   MG  --   --   --   --  1.6*  PHOS 3.9 6.3* 3.1 6.5*  --     GFR: Estimated Creatinine Clearance: 12.6 mL/min (A) (by C-G formula based on SCr of 7.21 mg/dL (H)). Liver Function Tests: Recent Labs  Lab 02/16/2021 0047 02/18/21 0833 02/18/21 1350 02/21/21 1237  ALBUMIN 1.8* 1.8* 2.0* 1.7*    No results for input(s): LIPASE, AMYLASE in the last 168 hours. No results for input(s): AMMONIA in the last 168 hours. Coagulation Profile: No results for input(s): INR, PROTIME in the last 168 hours. Cardiac Enzymes: No results for input(s): CKTOTAL, CKMB, CKMBINDEX, TROPONINI in the last 168 hours. BNP (last 3 results) No results for input(s): PROBNP in the last 8760 hours. HbA1C: No results for input(s): HGBA1C in the last 72 hours. CBG: Recent Labs  Lab 02/22/21 0744 02/22/21 1147 02/22/21 1636 02/22/21 2138 02/23/21 0812  GLUCAP 164* 159* 179* 156* 160*    Lipid Profile: No results for input(s): CHOL, HDL, LDLCALC, TRIG, CHOLHDL, LDLDIRECT in the last 72 hours. Thyroid Function Tests: No results for input(s): TSH, T4TOTAL, FREET4, T3FREE, THYROIDAB in the last  72 hours. Anemia Panel: No results for input(s): VITAMINB12, FOLATE, FERRITIN, TIBC, IRON, RETICCTPCT in the last 72 hours. Sepsis Labs: Recent Labs  Lab 02/21/21 1237  LATICACIDVEN 1.1     Recent Results (from the past 240 hour(s))  Surgical PCR screen     Status: Abnormal   Collection Time: 02/16/21 11:19 PM   Specimen: Nasal Mucosa; Nasal Swab  Result Value Ref Range Status   MRSA, PCR NEGATIVE NEGATIVE Final   Staphylococcus aureus POSITIVE (A) NEGATIVE Final    Comment: (NOTE) The Xpert SA Assay (FDA approved for NASAL specimens in patients 61 years of age and older), is one component of a comprehensive surveillance program. It is not intended to diagnose infection nor to guide or monitor treatment. Performed at [facility], [place] [address] [place], [place] [address]           Radiology Studies: ECHOCARDIOGRAM COMPLETE  Result Date: 02/21/2021    ECHOCARDIOGRAM  REPORT   Patient Name:   RAFFERTY ELSBERRY Date of Exam: 02/21/2021 Medical Rec #:  [id number]     Height:       70.0 in Accession #:    [id number]    Weight:       234.6 lb Date of Birth:  1956-05-10     BSA:          2.234 m Patient Age:    65 years      BP:           82/49 mmHg Patient Gender: M             HR:           95 bpm. Exam Location:  Inpatient Procedure: 2D Echo, Cardiac Doppler, Color Doppler and Intracardiac            Opacification Agent Indications:    Abn EKG  History:        Patient has no prior history of Echocardiogram examinations.                 CHF, Previous Myocardial Infarction, Defibrillator, Prior CABG,                 Prior Cardiac Surgery and Abnormal ECG, Arrythmias:Atrial                 Fibrillation, Signs/Symptoms:Chest Pain; Risk Factors:Diabetes.                 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  Sonographer:    Luisa Hart RDCS Referring Phys: [id number]  A REGALADO  Sonographer Comments: Technically challenging study due to limited acoustic windows and patient is morbidly obese. Image acquisition challenging due to patient body habitus. IMPRESSIONS  1. Left ventricular ejection fraction, by estimation, is 30 to 35%. The left ventricle has moderately decreased function. The left ventricle demonstrates global hypokinesis. There is mild left ventricular hypertrophy. Left ventricular diastolic parameters are indeterminate.  2. Right ventricular systolic function was not well visualized. The right ventricular size is normal There is a small (< 1 cm) echodensity on the RV device lead best seen on image 16.  3. The mitral valve is abnormal. Trivial mitral valve regurgitation. No evidence of mitral stenosis. Moderate mitral annular calcification.  4. The aortic valve is tricuspid. Aortic valve regurgitation is not visualized. Mild to moderate aortic valve sclerosis/calcification is present, without any evidence of aortic stenosis.  5. The inferior vena cava is dilated in size with <50% respiratory variability, suggesting right atrial pressure of 15 mmHg. Comparison(s): No prior Echocardiogram. FINDINGS  Left Ventricle: Left ventricular ejection fraction, by estimation, is 30 to 35%. The left ventricle has moderately decreased function. The left ventricle demonstrates global hypokinesis. Definity contrast agent was given IV to delineate the left ventricular endocardial borders. The left ventricular internal cavity size was normal in size. There is mild left ventricular hypertrophy. Left ventricular diastolic parameters are indeterminate. Right Ventricle: The right ventricular size is normal. Right vetricular wall thickness was not well visualized. Right ventricular systolic function was not well visualized. Left Atrium: Left atrial size was normal in size. Right  Atrium: Right atrial size was normal in size. Pericardium: There is no evidence of pericardial effusion. Mitral  Valve: The mitral valve is abnormal. Moderate mitral annular calcification. Trivial mitral valve regurgitation. No evidence of mitral valve stenosis. Tricuspid Valve: The tricuspid valve is normal in structure. Tricuspid valve regurgitation is trivial. Aortic Valve: The aortic valve is tricuspid. Aortic valve regurgitation is not visualized. Mild to moderate aortic valve sclerosis/calcification is present, without any evidence of aortic stenosis. Aortic valve mean gradient measures 5.0 mmHg. Aortic valve peak gradient measures 8.2 mmHg. Aortic valve area, by VTI measures 1.84 cm. Pulmonic Valve: The pulmonic valve was not well visualized. Pulmonic valve regurgitation is not visualized. No evidence of pulmonic stenosis. Aorta: The aortic root is normal in size and structure. Venous: The inferior vena cava is dilated in size with less than 50% respiratory variability, suggesting right atrial pressure of 15 mmHg. IAS/Shunts: The atrial septum is grossly normal. Additional Comments: A device lead is visualized.  LEFT VENTRICLE PLAX 2D LVIDd:         4.50 cm  Diastology LVIDs:         2.70 cm  LV e' medial:    4.60 cm/s LV PW:         1.50 cm  LV E/e' medial:  29.3 LV IVS:        1.70 cm  LV e' lateral:   6.53 cm/s LVOT diam:     2.10 cm  LV E/e' lateral: 20.7 LV SV:         40 LV SV Index:   18 LVOT Area:     3.46 cm  LEFT ATRIUM             Index       RIGHT ATRIUM           Index LA Vol (A2C):   59.3 ml 26.55 ml/m RA Area:     23.50 cm LA Vol (A4C):   66.6 ml 29.82 ml/m RA Volume:   69.50 ml  31.11 ml/m LA Biplane Vol: 64.9 ml 29.06 ml/m  AORTIC VALVE                    PULMONIC VALVE AV Area (Vmax):    1.83 cm     PV Vmax:       0.76 m/s AV Area (Vmean):   1.68 cm     PV Vmean:      53.800 cm/s AV Area (VTI):     1.84 cm     PV VTI:        0.152 m AV Vmax:           143.00 cm/s  PV Peak grad:  2.3 mmHg AV Vmean:          102.000 cm/s PV Mean grad:  1.0 mmHg AV VTI:            0.218 m AV Peak Grad:      8.2  mmHg AV Mean Grad:      5.0 mmHg LVOT Vmax:         75.50 cm/s LVOT Vmean:        49.500 cm/s LVOT VTI:          0.116 m LVOT/AV VTI ratio: 0.53  AORTA Ao Root diam: 2.90 cm Ao Asc diam:  2.50 cm MITRAL VALVE                TRICUSPID VALVE MV Area (PHT): 4.01 cm  TR Peak grad:   45.4 mmHg MV Decel Time: 189 msec     TR Vmax:        337.00 cm/s MV E velocity: 135.00 cm/s                             SHUNTS                             Systemic VTI:  0.12 m                             Systemic Diam: 2.10 cm Rudean Haskell MD Electronically signed by Rudean Haskell MD Signature Date/Time: 02/21/2021/3:39:54 PM    Final         Scheduled Meds:  (feeding supplement) PROSource Plus  30 mL Oral BID BM   amiodarone  200 mg Oral BID   apixaban  5 mg Oral BID   atorvastatin  40 mg Oral QPM   chlorhexidine  15 mL Mouth Rinse BID   Chlorhexidine Gluconate Cloth  6 each Topical Q0600   cholestyramine light  4 g Oral BID   clopidogrel  75 mg Oral Daily   darbepoetin (ARANESP) injection - DIALYSIS  200 mcg Intravenous Q Wed-HD   docusate sodium  100 mg Oral BID   feeding supplement (NEPRO CARB STEADY)  237 mL Oral Q24H   fentaNYL  1 patch Transdermal Q72H   ferric citrate  420 mg Oral BID WC   Gerhardt's butt cream   Topical TID   insulin aspart  0-5 Units Subcutaneous QHS   insulin aspart  0-9 Units Subcutaneous TID WC   insulin glargine-yfgn  5 Units Subcutaneous QHS   latanoprost  1 drop Both Eyes QHS   levothyroxine  25 mcg Oral QAC breakfast   mouth rinse  15 mL Mouth Rinse q12n4p   metoprolol succinate  12.5 mg Oral Daily   midodrine  10 mg Oral TID WC   multivitamin  1 tablet Oral QHS   pantoprazole  40 mg Oral Q0600   pregabalin  25 mg Oral BID   saccharomyces boulardii  250 mg Oral BID   Continuous Infusions:  ceFEPime (MAXIPIME) IV 1 g (02/22/21 1729)   lactated ringers Stopped (02/21/21 1532)     LOS: 16 days    Time spent: 35  minutes.     Oren Binet,  MD [facility]   If 7PM-7AM, please contact night-coverage [email or web address]  02/23/2021, 10:20 AM

## 2021-02-23 NOTE — Progress Notes (Signed)
Progress Note  Patient Name: Kyle Walton Date of Encounter: 02/23/2021  [facility] Cardiologist: Lauree Chandler, MD   Subjective   No acute events overnight. Sitting up eating breakfast this AM. Again discussed options for management of his tachyarrhythmia. Continues to bounce between 90-120 bpm. High risk for TEE. Discussed potential CT to exclude thrombus, discussed pros/cons. He is amenable and willing to do whatever it takes to possibly cardiovert sooner than the three weeks of uninterrupted anticoagulation.  Inpatient Medications    Scheduled Meds:  (feeding supplement) PROSource Plus  30 mL Oral BID BM   amiodarone  200 mg Oral BID   apixaban  5 mg Oral BID   atorvastatin  40 mg Oral QPM   chlorhexidine  15 mL Mouth Rinse BID   Chlorhexidine Gluconate Cloth  6 each Topical Q0600   cholestyramine light  4 g Oral BID   clopidogrel  75 mg Oral Daily   darbepoetin (ARANESP) injection - DIALYSIS  200 mcg Intravenous Q Wed-HD   docusate sodium  100 mg Oral BID   feeding supplement (NEPRO CARB STEADY)  237 mL Oral Q24H   fentaNYL  1 patch Transdermal Q72H   ferric citrate  420 mg Oral BID WC   Gerhardt's butt cream   Topical TID   insulin aspart  0-5 Units Subcutaneous QHS   insulin aspart  0-9 Units Subcutaneous TID WC   insulin glargine-yfgn  5 Units Subcutaneous QHS   latanoprost  1 drop Both Eyes QHS   levothyroxine  25 mcg Oral QAC breakfast   mouth rinse  15 mL Mouth Rinse q12n4p   metoprolol succinate  12.5 mg Oral Daily   midodrine  10 mg Oral TID WC   multivitamin  1 tablet Oral QHS   pantoprazole  40 mg Oral Q0600   pregabalin  25 mg Oral BID   saccharomyces boulardii  250 mg Oral BID   Continuous Infusions:  ceFEPime (MAXIPIME) IV 1 g (02/22/21 1729)   lactated ringers Stopped (02/21/21 1532)   PRN Meds: acetaminophen **OR** acetaminophen, clonazepam, docusate sodium, HYDROmorphone, liver oil-zinc oxide, nitroGLYCERIN, ondansetron **OR**  ondansetron (ZOFRAN) IV, polyethylene glycol   Vital Signs    Vitals:   02/23/21 0501 02/23/21 0703 02/23/21 0828 02/23/21 0859  BP: (!) 85/53 101/71 95/64 (!) 85/59  Pulse: (!) 114 (!) 115 (!) 116 (!) 118  Resp: '16 14 19 20  '$ Temp: 98.8 F (37.1 C) 98.8 F (37.1 C) 97.9 F (36.6 C) 97.7 F (36.5 C)  TempSrc: Oral Oral Oral Oral  SpO2: 97% 94% 95% 94%  Weight:      Height:        Intake/Output Summary (Last 24 hours) at 02/23/2021 1024 Last data filed at 02/23/2021 0400 Gross per 24 hour  Intake 369.97 ml  Output --  Net 369.97 ml   Last 3 Weights 02/23/2021 02/22/2021 02/22/2021  Weight (lbs) 240 lb 8.4 oz 241 lb 10 oz (No Data)  Weight (kg) 109.1 kg 109.6 kg (No Data)      Telemetry    Similar pattern--atach with clear plateaus/steep elevations and drops - Personally Reviewed  ECG    No new since 02/11/2021 - Personally Reviewed  Physical Exam   GEN: Well nourished, well developed in no acute distress. Awake, alerted, oriented NECK: No JVD CARDIAC: regular rhythm, normal S1 and S2, no rubs or gallops. No murmur. VASCULAR: Radial pulses 2+ bilaterally.  RESPIRATORY:  Clear to auscultation without rales, wheezing or rhonchi  ABDOMEN: Soft,  non-tender, non-distended MUSCULOSKELETAL:  Moves all 4 limbs independently SKIN: Warm and dry, no edema PSYCHIATRIC:  Normal affect    Labs    High Sensitivity Troponin:  No results for input(s): TROPONINIHS in the last 720 hours.    Chemistry Recent Labs  Lab 02/18/21 0833 02/18/21 1350 02/21/21 1237  NA 135 134* 134*  K 3.7 3.3* 3.7  CL 101 97* 102  CO2 22 26 18*  GLUCOSE 156* 109* 166*  BUN 46* 19 56*  CREATININE 6.31* 3.33* 7.21*  CALCIUM 7.9* 7.8* 7.6*  ALBUMIN 1.8* 2.0* 1.7*  GFRNONAA 9* 20* 8*  ANIONGAP '12 11 14     '$ Hematology Recent Labs  Lab 02/20/21 0104 02/21/21 1237 02/22/21 0730  WBC 10.7* 13.8* 12.6*  RBC 2.75* 2.67* 3.12*  HGB 7.9* 7.4* 8.9*  HCT 25.6* 24.9* 28.4*  MCV 93.1 93.3 91.0  MCH  28.7 27.7 28.5  MCHC 30.9 29.7* 31.3  RDW 20.2* 19.9* 19.0*  PLT 186 211 229    BNPNo results for input(s): BNP, PROBNP in the last 168 hours.   DDimer No results for input(s): DDIMER in the last 168 hours.   Radiology    ECHOCARDIOGRAM COMPLETE  Result Date: 02/21/2021    ECHOCARDIOGRAM REPORT   Patient Name:   Kyle Walton Date of Exam: 02/21/2021 Medical Rec #:  [id number]     Height:       70.0 in Accession #:    [id number]    Weight:       234.6 lb Date of Birth:  1956-05-28     BSA:          2.234 m Patient Age:    65 years      BP:           82/49 mmHg Patient Gender: M             HR:           95 bpm. Exam Location:  Inpatient Procedure: 2D Echo, Cardiac Doppler, Color Doppler and Intracardiac            Opacification Agent Indications:    Abn EKG  History:        Patient has no prior history of Echocardiogram examinations.                 CHF, Previous Myocardial Infarction, Defibrillator, Prior CABG,                 Prior Cardiac Surgery and Abnormal ECG, Arrythmias:Atrial                 Fibrillation, Signs/Symptoms:Chest Pain; Risk Factors:Diabetes.                 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  Sonographer:    Luisa Hart RDCS Referring Phys: [id number] BELKYS A REGALADO  Sonographer Comments: Technically challenging study due to limited acoustic windows and patient is morbidly obese. Image acquisition challenging due to patient body habitus. IMPRESSIONS  1. Left ventricular ejection fraction, by estimation, is 30 to 35%. The left ventricle has moderately decreased function. The left ventricle demonstrates global hypokinesis. There is  mild left ventricular hypertrophy. Left ventricular diastolic parameters are indeterminate.  2. Right ventricular systolic function was not well visualized. The right ventricular size is normal There is a small (< 1 cm) echodensity on  the RV device lead best seen on image 16.  3. The mitral valve is abnormal. Trivial mitral valve regurgitation. No evidence of mitral stenosis. Moderate mitral annular calcification.  4. The aortic valve is tricuspid. Aortic valve regurgitation is not visualized. Mild to moderate aortic valve sclerosis/calcification is present, without any evidence of aortic stenosis.  5. The inferior vena cava is dilated in size with <50% respiratory variability, suggesting right atrial pressure of 15 mmHg. Comparison(s): No prior Echocardiogram. FINDINGS  Left Ventricle: Left ventricular ejection fraction, by estimation, is 30 to 35%. The left ventricle has moderately decreased function. The left ventricle demonstrates global hypokinesis. Definity contrast agent was given IV to delineate the left ventricular endocardial borders. The left ventricular internal cavity size was normal in size. There is mild left ventricular hypertrophy. Left ventricular diastolic parameters are indeterminate. Right Ventricle: The right ventricular size is normal. Right vetricular wall thickness was not well visualized. Right ventricular systolic function was not well visualized. Left Atrium: Left atrial size was normal in size. Right Atrium: Right atrial size was normal in size. Pericardium: There is no evidence of pericardial effusion. Mitral Valve: The mitral valve is abnormal. Moderate mitral annular calcification. Trivial mitral valve regurgitation. No evidence of mitral valve stenosis. Tricuspid Valve: The tricuspid valve is normal in structure. Tricuspid valve regurgitation is trivial. Aortic Valve: The aortic valve is tricuspid. Aortic valve regurgitation is not visualized. Mild to moderate aortic valve sclerosis/calcification is present, without any evidence of aortic stenosis. Aortic valve mean gradient measures 5.0 mmHg. Aortic valve peak gradient measures 8.2 mmHg. Aortic valve area, by VTI measures 1.84 cm. Pulmonic Valve: The pulmonic  valve was not well visualized. Pulmonic valve regurgitation is not visualized. No evidence of pulmonic stenosis. Aorta: The aortic root is normal in size and structure. Venous: The inferior vena cava is dilated in size with less than 50% respiratory variability, suggesting right atrial pressure of 15 mmHg. IAS/Shunts: The atrial septum is grossly normal. Additional Comments: A device lead is visualized.  LEFT VENTRICLE PLAX 2D LVIDd:         4.50 cm  Diastology LVIDs:         2.70 cm  LV e' medial:    4.60 cm/s LV PW:         1.50 cm  LV E/e' medial:  29.3 LV IVS:        1.70 cm  LV e' lateral:   6.53 cm/s LVOT diam:     2.10 cm  LV E/e' lateral: 20.7 LV SV:         40 LV SV Index:   18 LVOT Area:     3.46 cm  LEFT ATRIUM             Index       RIGHT ATRIUM           Index LA Vol (A2C):   59.3 ml 26.55 ml/m RA Area:     23.50 cm LA Vol (A4C):   66.6 ml 29.82 ml/m RA Volume:   69.50 ml  31.11 ml/m LA Biplane Vol: 64.9 ml 29.06 ml/m  AORTIC VALVE                    PULMONIC VALVE AV Area (Vmax):  1.83 cm     PV Vmax:       0.76 m/s AV Area (Vmean):   1.68 cm     PV Vmean:      53.800 cm/s AV Area (VTI):     1.84 cm     PV VTI:        0.152 m AV Vmax:           143.00 cm/s  PV Peak grad:  2.3 mmHg AV Vmean:          102.000 cm/s PV Mean grad:  1.0 mmHg AV VTI:            0.218 m AV Peak Grad:      8.2 mmHg AV Mean Grad:      5.0 mmHg LVOT Vmax:         75.50 cm/s LVOT Vmean:        49.500 cm/s LVOT VTI:          0.116 m LVOT/AV VTI ratio: 0.53  AORTA Ao Root diam: 2.90 cm Ao Asc diam:  2.50 cm MITRAL VALVE                TRICUSPID VALVE MV Area (PHT): 4.01 cm     TR Peak grad:   45.4 mmHg MV Decel Time: 189 msec     TR Vmax:        337.00 cm/s MV E velocity: 135.00 cm/s                             SHUNTS                             Systemic VTI:  0.12 m                             Systemic Diam: 2.10 cm Rudean Haskell MD Electronically signed by Rudean Haskell MD Signature Date/Time:  02/21/2021/3:39:54 PM    Final     Cardiac Studies   Echo 02/21/21  1. Left ventricular ejection fraction, by estimation, is 30 to 35%. The  left ventricle has moderately decreased function. The left ventricle  demonstrates global hypokinesis. There is mild left ventricular  hypertrophy. Left ventricular diastolic  parameters are indeterminate.   2. Right ventricular systolic function was not well visualized. The right  ventricular size is normal There is a small (< 1 cm) echodensity on the RV  device lead best seen on image 16.   3. The mitral valve is abnormal. Trivial mitral valve regurgitation. No  evidence of mitral stenosis. Moderate mitral annular calcification.   4. The aortic valve is tricuspid. Aortic valve regurgitation is not  visualized. Mild to moderate aortic valve sclerosis/calcification is  present, without any evidence of aortic stenosis.   5. The inferior vena cava is dilated in size with <50% respiratory  variability, suggesting right atrial pressure of 15 mmHg.   Comparison(s): No prior Echocardiogram.   Per Care Everywhere: Coronary angiography 04/30/2018: PROCEDURES PERFORMED:  --  Left heart catheterization with ventriculography. --  Left coronary angiography. --  Saphenous vein graft angiography. --  LIMA graft angiography. --  Root aortography. --  US Guidance Vascular Access. --  Pressure Wire procedure. --  Intervention on OM1: percutaneous intervention, myocardial flow reserve.  INDICATIONS: Angina/MI: unstable angina, CCS class IV. Abnormal stress test.  SUMMARY:  --  CORONARY CIRCULATION: --  Proximal LAD: There was a diffuse 80 % stenosis. The lesion was heavily calcified. --  Mid circumflex: There was a 75 % stenosis. --  1st obtuse marginal: The vessel was small to medium sized. Angiography showed moderate atherosclerosis. There was a diffuse 70 % stenosis. There was TIMI grade 3 flow through the vessel (brisk flow). --  Proximal RCA:  There was a 100 % stenosis. --  Graft to the mid LAD: The graft was a patent LIMA. --  Graft to the 3rd obtuse marginal: The graft was a patent saphenous vein graft. --  Graft to the RPDA: The graft was a patent saphenous vein graft.  --  1ST LESION INTERVENTIONS: --  A percutaneous intervention with myocardial flow reserve measurement was performed on the lesion in the 1st obtuse marginal.  IMPRESSIONS: Stable 3 vessel CAD Absent LN LAD ca++ difuse 80% Cx om1 small 60-70% --> neg DFR RCA CTO  Patent 3 of 3 grafts LIMA to LAD SVG to RCA -PDA SVG to distal Cx-PL  For MOD SED see prelim post op note  CORONARY CIRCULATION: The coronary circulation is right dominant. 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 Proximal LAD: There was a diffuse 80 % stenosis. The lesion was heavily calcified. Mid circumflex: The vessel was small to medium sized. There was a 75 % stenosis. 1st obtuse marginal: The vessel was small to medium sized. Angiography showed moderate atherosclerosis. There was a diffuse 70 % stenosis. There was TIMI grade 3 flow through the vessel (brisk flow). Proximal RCA: There was a 100 % stenosis. Right PDA: The vessel was small sized. Graft to the mid LAD: The graft was a patent LIMA. Graft to the 3rd obtuse marginal: The graft was a patent saphenous vein graft. Graft to the RPDA: The graft was a patent saphenous vein graft.  PROCEDURE: The risks and alternatives of the procedures and conscious sedation were explained to the patient and informed consent was obtained. The patient was brought to the cath lab and placed on the table. The planned puncture sites were prepped and draped in the usual sterile fashion.  --  Right femoral artery access. The puncture site was infiltrated with 1 % lidocaine. The vessel was accessed using the modified Seldinger technique, a wire was threaded into the vessel, and  a sheath was advanced over the wire into the vessel.  --  Left heart catheterization. A catheter was advanced to the ascending aorta. After recording ascending aortic pressure, the catheter was advanced across the aortic valve and left ventricular pressure was recorded. Ventriculography was performed using power injection of contrast agent. Imaging was performed using an RAO projection. Post-ventriculography LV pressure was obtained. The catheter was gradually withdrawn into the aorta under continuous pressure monitoring and aortic pressure was recorded.  --  Left coronary artery angiography. A catheter was advanced to the aorta and positioned in the vessel ostium under fluoroscopic guidance.  --  Saphenous vein graft angiography. A catheter was advanced to the aorta and positioned at the aortic anastomosis of the graft under fluoroscopic guidance.  --  Left internal mammary graft angiography. A catheter was advanced to the subclavian artery and positioned at the vessel origin under fluoroscopic guidance.  --  Root aortography. A catheter was positioned and contrast was injected.  --  US Guidance Vascular Access.  --  Pressure Wire procedure.  LESION INTERVENTION: A percutaneous intervention with myocardial  flow reserve measurement was performed on the lesion in the 1st obtuse marginal. There was TIMI 3 flow before the procedure and TIMI 3 flow after the procedure. There was no acute vessel closure. There was no perforation. There was no dissection.  --  Sheath exchange. The sheath was exchanged for a 53F 23cm.  --  Vessel setup was performed. A 53F [id number] guiding catheter was used to cannulate the vessel.  --  Myocardial Fractional Flow Reserve (FFR) measurement was performed using a 0.014" pressure-monitoring Comet Pressure Wire guide-wire. Steady baseline values were obtained. Mean arterial pressure and mean distal coronary pressures were then obtained at maximum  hyperemia.  COMPLICATIONS: No complications occurred during the cath lab visit.    Patient Profile     65 y.o. male with complex history, notable for ESRD on HD, anemia of chronic diease, type 2 diabetes, paroxysmal atrial fibrillation/flutter, COPD, OSA on BiPAP, chronic hypoxic respiratory failure on home O2 who has had a complicated hospital course for right thigh abscess. Cardiology asked to evaluate atrial fibrillation/flutter in consultation at the request of Dr. Tyrell Antonio  Assessment & Plan    Paroxsymal atrial fibrillation Atypical atrial flutter/atrial tachycardia -per care everywhere, history of CTI and ablation at [facility] 06/2017. Noted to have SVT with aberrancy, started on amiodarone and beta blocker in 04/2018. Long pattern of no shows/cancelled appts  -based on his recent telemetry, with the sharp high/low plateau pattern of heart rate, this appears to be most consistent with an atrial tachycardia or atypical flutter. Brief strips look more like afib, without clear flutter waves, but overall heart rate pattern more consistent with atach/atypical flutter -he has been having more rates in the 120s rather than the 90s. IV amiodarone bolus did not help yesterday -we have discussed at length that with his comorbidities and chronic hypotension, he is high risk for prolonged anesthesia. A cardioversion only would be much safer than a TEE-CV given the needed duration of anesthesia. He understands and agrees. He started apixaban on 02/23/2021; he could be cardioverted without TEE on 9/1 at the earliest. However, with his continued tachycardia and baseline low blood pressures, I would prefer not to wait until this time to attempt restoration of sinus rhythm. I discussed CT today to exclude thrombus. Discussed risks and benefits. He is very enthusiastic and would like to do anything to try to move up timing of cardioversion. Will work on getting CT done today and possible cardioversion as early as  tomorrow. -with his long history (at least three years) of what appears to be the same arrhythmia, unclear if cardioversion will be successful, but will attempt. Lower risk that re-attempt of ablation at this time.  Cardiomyopathy, ischemic etiology: -no prior echo in our system. Echo 8/15 showed EF 30-35%. On review of Care Everywhere, this is similar to an echo he had done at [facility] 09/17/20.  -only GDMT he can tolerate is low dose metoprolol. With chronic hypotension and ESRD, no other medications are appropriate -dual chamber ICD in place, per Care Everywhere this is a [employer]. Will get interrogated today  CAD with history of CABGx3 in 2016 (LIMA-LAD, SVG-PDA, SVG-Lcx) PAD -continue clopidogrel, on apixaban as above -continue atorvastatin -denies pain  ESRD on HD Anemia of chronic disease Type II diabetes -per primary team  Complex medical decision making, >50 minutes on chart review, communication with teams, and discussion with patient.  Buford Dresser, MD, PhD, [place]    For questions or updates, please  contact [place] Please consult [email or web address] for contact info under     Signed, Buford Dresser, MD  02/23/2021, 10:24 AM

## 2021-02-24 DIAGNOSIS — N186 End stage renal disease: Secondary | ICD-10-CM | POA: Diagnosis not present

## 2021-02-24 DIAGNOSIS — I255 Ischemic cardiomyopathy: Secondary | ICD-10-CM | POA: Diagnosis not present

## 2021-02-24 DIAGNOSIS — I471 Supraventricular tachycardia: Secondary | ICD-10-CM | POA: Diagnosis not present

## 2021-02-24 DIAGNOSIS — I2510 Atherosclerotic heart disease of native coronary artery without angina pectoris: Secondary | ICD-10-CM | POA: Diagnosis not present

## 2021-02-24 LAB — GLUCOSE, CAPILLARY
Glucose-Capillary: 177 mg/dL — ABNORMAL HIGH (ref 70–99)
Glucose-Capillary: 194 mg/dL — ABNORMAL HIGH (ref 70–99)
Glucose-Capillary: 178 mg/dL — ABNORMAL HIGH (ref 70–99)
Glucose-Capillary: 146 mg/dL — ABNORMAL HIGH (ref 70–99)

## 2021-02-24 NOTE — Progress Notes (Signed)
   02/24/21 1233  Assess: MEWS Score  BP (!) 88/49  Pulse Rate (!) 118  Level of Consciousness Alert  SpO2 95 %  O2 Device Nasal Cannula  O2 Flow Rate (L/min) 2 L/min  Assess: MEWS Score  MEWS Temp 0  MEWS Systolic 1  MEWS Pulse 2  MEWS RR 0  MEWS LOC 0  MEWS Score 3  MEWS Score Color Yellow  Assess: if the MEWS score is Yellow or Red  Were vital signs taken at a resting state? Yes  Focused Assessment No change from prior assessment  Early Detection of Sepsis Score *See Row Information* Medium  MEWS guidelines implemented *See Row Information* Yes  Treat  MEWS Interventions Administered scheduled meds/treatments  Pain Scale 0-10  Pain Score 0  Take Vital Signs  Increase Vital Sign Frequency  Yellow: Q 2hr X 2 then Q 4hr X 2, if remains yellow, continue Q 4hrs  Escalate  MEWS: Escalate Yellow: discuss with charge nurse/RN and consider discussing with provider and RRT  Notify: Charge Nurse/RN  Name of Charge Nurse/RN Notified McKenzie RN  Date Charge Nurse/RN Notified 02/24/21  Time Charge Nurse/RN Notified 1239  Document  Patient Outcome Other (Comment) (stable)  Progress note created (see row info) Yes

## 2021-02-24 NOTE — Progress Notes (Signed)
Patient refused BiPAP for the night.  Patient VSS.  RT will continue to monitor.

## 2021-02-24 NOTE — Progress Notes (Signed)
Physical Therapy Treatment Patient Details Name: Kyle Walton MRN: [id number] DOB: 08-Feb-1956 Today's Date: 02/24/2021    History of Present Illness Pt is a 65 year old man admitted on 02/14/2021 with non healing R thigh wound now s/p I&D on 8/11. He was admitted in July R thigh infection with hospital course complicated by post op acute respiratior failure, he discharged to SNF for rehab. PMH: ESRD, DM2, morbid obesity, CHF, PAF, chronic hypoxic respiratory failure and hypothyroidism.    PT Comments    Patient received in bed, vitals continue to be tenuous with HR 120-130, BP consistently in 80s/50-70s, and SPO2 as low as 86% on room air (WNL with supplemental O2 per [place]). Still needs heavy levels of physical assist for low level tasks, and only able to tolerate sitting at EOB for about 30 seconds due to pain today. Worked on heel cord stretching and knee flexion ROM as tolerated and able. Left in bed positioned to comfort with all needs met, bed alarm active. Continue to feel he is most appropriate for LTACH.    Follow Up Recommendations  [facility] bed;Wheelchair (measurements PT);Wheelchair cushion (measurements PT);Other (comment) (hoyer lift and pads)    Recommendations for Other Services       Precautions / Restrictions Precautions Precautions: Fall Precaution Comments: watch HR/O2/BP Restrictions Weight Bearing Restrictions: No    Mobility  Bed Mobility Overal bed mobility: Needs Assistance Bed Mobility: Rolling;Sidelying to Sit;Sit to Sidelying Rolling: Max assist Sidelying to sit: +2 for physical assistance;Max assist     Sit to sidelying: +2 for physical assistance;Max assist General bed mobility comments: rolled multiple times for pericare and to change linen, assist for LEs over EOB and to raise trunk with increased time and effort, guided trunk and assist for LEs back into bed    Transfers                 General transfer  comment: deferred due to elevated HR  Ambulation/Gait             General Gait Details: unable   Stairs             Wheelchair Mobility    Modified Rankin (Stroke Patients Only)       Balance Overall balance assessment: Needs assistance Sitting-balance support: Feet supported;Bilateral upper extremity supported Sitting balance-Leahy Scale: Fair Sitting balance - Comments: fair static balance at EOB, only able to tolerate x 30 secs due to buttocks pain                                    Cognition Arousal/Alertness: Awake/alert Behavior During Therapy: WFL for tasks assessed/performed Overall Cognitive Status: No family/caregiver present to determine baseline cognitive functioning Area of Impairment: Memory;Awareness;Problem solving;Following commands;Attention                   Current Attention Level: Sustained Memory: Decreased short-term memory Following Commands: Follows one step commands with increased time Safety/Judgement: Decreased awareness of deficits Awareness: Intellectual Problem Solving: Slow processing;Decreased initiation;Difficulty sequencing;Requires verbal cues General Comments: pt with high expectations for recovery of mobility      Exercises      General Comments General comments (skin integrity, edema, etc.): BP consistently 80s/50s-70s with all mobility, HR 120-130BPM, spO2 86-88% on room air but in 90s when nasal cannula replaced      Pertinent Vitals/Pain Pain Assessment: Faces  Faces Pain Scale: Hurts even more Pain Location: buttocks with pericare Pain Descriptors / Indicators: Discomfort;Sore;Grimacing;Guarding Pain Intervention(s): Monitored during session    Home Living                      Prior Function            PT Goals (current goals can now be found in the care plan section) Acute Rehab PT Goals Patient Stated Goal: To be able to walk again PT Goal Formulation: With patient Time  For Goal Achievement: 03/02/21 Potential to Achieve Goals: Fair Progress towards PT goals: Progressing toward goals (very slowly , very motivated but limited by medical factors)    Frequency    Min 2X/week (due to fatigue limiting participation on HD days (MWF))      PT Plan Frequency needs to be updated    Co-evaluation   Reason for Co-Treatment: For patient/therapist safety   OT goals addressed during session: ADL's and self-care      AM-PAC PT "6 Clicks" Mobility   Outcome Measure  Help needed turning from your back to your side while in a flat bed without using bedrails?: A Lot Help needed moving from lying on your back to sitting on the side of a flat bed without using bedrails?: Total Help needed moving to and from a bed to a chair (including a wheelchair)?: Total Help needed standing up from a chair using your arms (e.g., wheelchair or bedside chair)?: Total Help needed to walk in hospital room?: Total Help needed climbing 3-5 steps with a railing? : Total 6 Click Score: 7    End of Session   Activity Tolerance: Patient tolerated treatment well Patient left: in bed;with call bell/phone within reach;with bed alarm set Nurse Communication: Mobility status PT Visit Diagnosis: Muscle weakness (generalized) (M62.81);Pain;Other abnormalities of gait and mobility (R26.89);Unsteadiness on feet (R26.81) Pain - Right/Left: Right Pain - part of body: Leg     Time: 5208-0223 PT Time Calculation (min) (ACUTE ONLY): 32 min  Charges:  $Therapeutic Activity: 8-22 mins (co-tx with OT)                    Ann Lions PT, DPT, PN2   Supplemental Physical Therapist [place]    Pager [phone] Acute Rehab Office [phone]

## 2021-02-24 NOTE — TOC Progression Note (Addendum)
Transition of Care [facility]) - Progression Note    Patient Details  Name: Coletin Sigrist MRN: [id number] Date of Birth: 04/17/1956  [facility]) CM/SW [place], [place] Phone Number: 02/24/2021, 3:58 PM  Clinical Narrative:    3pm-Denial received from [facility] peer to peer. CSW awaiting response from [facility] on if they would accept patient back if insurance approves SNF level.   4:15pm-Per [employer], patient is out of SNF days and needing long term care, which they do not have available. As patient's spouse is not able to pay for SNF, no other options available at this time pending Medicaid.    Expected Discharge Plan: Skilled Nursing Facility Barriers to Discharge: Ship broker, Continued Medical Work up  Expected Discharge Plan and Services Expected Discharge Plan: [place] In-house Referral: Clinical Social Work   Post Acute Care Choice: [place] Living arrangements for the past 2 months: [place]                                       Social Determinants of Health (SDOH) Interventions    Readmission Risk Interventions Readmission Risk Prevention Plan 02/16/2021 01/28/2021  Transportation Screening Complete Complete  Medication Review (RN Care Manager) Referral to Pharmacy Referral to Pharmacy  PCP or Specialist appointment within 3-5 days of discharge Complete Complete  HRI or Home Care Consult Complete Complete  SW Recovery Care/Counseling Consult Complete Complete  Palliative Care Screening Not Applicable Not Applicable  Skilled Nursing Facility Complete Complete

## 2021-02-24 NOTE — Progress Notes (Signed)
Progress Note  Patient Name: Kyle Walton Date of Encounter: 02/24/2021  [facility] Cardiologist: Lauree Chandler, MD   Subjective   Discussed results of CT. Had planned for cardioversion today, but unfortunately ate breakfast. Will plan for tomorrow. Having back pain from being in bed, but no other concerns at this time. Does not think anyone checked on his device yesterday.  Inpatient Medications    Scheduled Meds:  (feeding supplement) PROSource Plus  30 mL Oral BID BM   amiodarone  200 mg Oral BID   apixaban  5 mg Oral BID   atorvastatin  40 mg Oral QPM   chlorhexidine  15 mL Mouth Rinse BID   Chlorhexidine Gluconate Cloth  6 each Topical Q0600   cholestyramine light  4 g Oral BID   clopidogrel  75 mg Oral Daily   darbepoetin (ARANESP) injection - DIALYSIS  200 mcg Intravenous Q Wed-HD   docusate sodium  100 mg Oral BID   feeding supplement (NEPRO CARB STEADY)  237 mL Oral Q24H   fentaNYL  1 patch Transdermal Q72H   ferric citrate  420 mg Oral BID WC   Gerhardt's butt cream   Topical TID   insulin aspart  0-5 Units Subcutaneous QHS   insulin aspart  0-9 Units Subcutaneous TID WC   insulin glargine-yfgn  5 Units Subcutaneous QHS   latanoprost  1 drop Both Eyes QHS   levothyroxine  25 mcg Oral QAC breakfast   mouth rinse  15 mL Mouth Rinse q12n4p   metoprolol succinate  12.5 mg Oral Daily   midodrine  10 mg Oral TID WC   multivitamin  1 tablet Oral QHS   pantoprazole  40 mg Oral Q0600   pregabalin  25 mg Oral BID   saccharomyces boulardii  250 mg Oral BID   Continuous Infusions:  ceFEPime (MAXIPIME) IV 1 g (02/23/21 1719)   lactated ringers Stopped (02/21/21 1532)   PRN Meds: acetaminophen **OR** acetaminophen, clonazepam, docusate sodium, HYDROmorphone, liver oil-zinc oxide, nitroGLYCERIN, ondansetron **OR** ondansetron (ZOFRAN) IV, polyethylene glycol   Vital Signs    Vitals:   02/24/21 0609 02/24/21 0725 02/24/21 0814 02/24/21 1156  BP: (!) 91/54  (!) 85/51 (!) 87/52 92/63  Pulse:  99  (!) 121  Resp:  20  17  Temp:  (!) 97.5 F (36.4 C)  97.8 F (36.6 C)  TempSrc:  Oral  Oral  SpO2:  97%  97%  Weight:      Height:        Intake/Output Summary (Last 24 hours) at 02/24/2021 1201 Last data filed at 02/23/2021 1445 Gross per 24 hour  Intake --  Output -454 ml  Net 454 ml   Last 3 Weights 02/24/2021 02/23/2021 02/23/2021  Weight (lbs) 234 lb 1.6 oz 245 lb 9.5 oz 232 lb 5.8 oz  Weight (kg) 106.187 kg 111.4 kg 105.4 kg      Telemetry    Similar pattern--atach with clear plateaus/steep elevations and drops - Personally Reviewed  ECG    No new since 03/03/2021 - Personally Reviewed  Physical Exam   GEN: Well nourished, well developed in no acute distress NECK: No JVD appreciated CARDIAC: tachycardic, regular rhythm, normal S1 and S2, no rubs or gallops. No murmur. VASCULAR: Radial pulses 2+ bilaterally.  RESPIRATORY:  Clear to auscultation anteriorly and laterally ABDOMEN: Soft, non-tender, non-distended MUSCULOSKELETAL:  Moves all 4 limbs independently SKIN: Warm and dry, trivial LE edema. S/P toe amputations NEUROLOGIC:  No focal neuro deficits noted.  PSYCHIATRIC:  Normal affect    Labs    High Sensitivity Troponin:  No results for input(s): TROPONINIHS in the last 720 hours.    Chemistry Recent Labs  Lab 02/18/21 1350 02/21/21 1237 02/23/21 1051  NA 134* 134* 135  K 3.3* 3.7 3.6  CL 97* 102 100  CO2 26 18* 22  GLUCOSE 109* 166* 214*  BUN 19 56* 46*  CREATININE 3.33* 7.21* 5.75*  CALCIUM 7.8* 7.6* 7.7*  ALBUMIN 2.0* 1.7* 1.6*  GFRNONAA 20* 8* 10*  ANIONGAP '11 14 13     '$ Hematology Recent Labs  Lab 02/21/21 1237 02/22/21 0730 02/23/21 1052  WBC 13.8* 12.6* 12.7*  RBC 2.67* 3.12* 2.87*  HGB 7.4* 8.9* 8.2*  HCT 24.9* 28.4* 26.9*  MCV 93.3 91.0 93.7  MCH 27.7 28.5 28.6  MCHC 29.7* 31.3 30.5  RDW 19.9* 19.0* 19.6*  PLT 211 229 228    BNPNo results for input(s): BNP, PROBNP in the last 168 hours.    DDimer No results for input(s): DDIMER in the last 168 hours.   Radiology    CT CARDIAC MORPH/PULM VEIN W/CM&W/O CA SCORE  Addendum Date: 02/24/2021   ADDENDUM REPORT: 02/24/2021 09:36 EXAM: OVER-READ INTERPRETATION  CT CHEST The following report is an over-read performed by radiologist Dr. Alvino Blood [facility] on 02/24/2021. This over-read does not include interpretation of cardiac or coronary anatomy or pathology. The coronary CTA interpretation by the cardiologist is attached. COMPARISON:  None. FINDINGS: Limited view of the lung parenchyma demonstrates bibasilar atelectasis amd trace effusions. Airways are normal. Limited view of the mediastinum demonstrates no adenopathy. Limited view of the upper abdomen demonstrates small amount of high-density material within the esophagus which would indicate oral contrast. Limited view of the skeleton and chest wall is unremarkable. IMPRESSION: 1. Moderate basilar atelectasis trace effusions. 2. New simple intraperitoneal free fluid. 3. High-density material within the esophagus suggest oral contrast. Recommend clinical correlation. These results will be called to the ordering clinician or representative by the Radiologist Assistant, and communication documented in the PACS or [employer]. Electronically Signed   By: Suzy Bouchard M.D.   On: 02/24/2021 09:36   Result Date: 02/24/2021 CLINICAL DATA:  Pre Cardioversion EXAM: Cardiac Gated CTA TECHNIQUE: The patient was scanned on a Siemens Force [id number] slice scanner. Gantry rotation speed was 250 msec with a temporal resolution of 66 msec. A prospective scan was triggered in the ascending thoracic aorta at 140 HU's Data sets were reconstructed with full mA between 35% and 75% of the R-R interval Images were reviewed using VRT, MIP and MPR modes. Double oblique images were used to measure the PV diameter and areas. The patient received 80 cc of contrast at 5 cc/sec CONTRAST:  Isovue 370  total 80 cc COMPARISON:  None FINDINGS: Calcium score not done previous CABG LIMA patent to LAD SVG patent to PDA SVG patent to OM AICD wires noted in RA/RV Mild LAE Normal RA No PFO/ASD No pericardial effusion LAA large with Broccoli type morphology No LAA thrombus LUPV:  Ostium 14 mm LLPV:   Ostium 16.7 mm RUPV:  Ostium 16.3 mm RLPV:  Ostium 17.2 mm IMPRESSION: 1.  Mild LAE Normal RA No LAA thrombus 2.  Patent SVG;s to PDA/OM Patent LIMA to LAD 3.  No ASD/PFO 4.  Normal PV anatomy 5.  AICD wires noted in RA/RV 6.  Normal aortic root 3.2 cm Jenkins Rouge MD [facility] Electronically Signed: By: Jenkins Rouge M.D. On: 02/23/2021 18:53  Cardiac Studies   CT cardiac morph 02/23/21 FINDINGS: Calcium score not done previous CABG   LIMA patent to LAD  SVG patent to PDA  SVG patent to OM   AICD wires noted in RA/RV   Mild LAE Normal RA No PFO/ASD No pericardial effusion  LAA large with Broccoli type morphology No LAA thrombus   LUPV:  Ostium 14 mm  LLPV:   Ostium 16.7 mm  RUPV:  Ostium 16.3 mm  RLPV:  Ostium 17.2 mm   IMPRESSION: 1.  Mild LAE Normal RA No LAA thrombus  2.  Patent SVG;s to PDA/OM Patent LIMA to LAD  3.  No ASD/PFO  4.  Normal PV anatomy  5.  AICD wires noted in RA/RV  6.  Normal aortic root 3.2 cm  Echo 02/21/21  1. Left ventricular ejection fraction, by estimation, is 30 to 35%. The  left ventricle has moderately decreased function. The left ventricle  demonstrates global hypokinesis. There is mild left ventricular  hypertrophy. Left ventricular diastolic  parameters are indeterminate.   2. Right ventricular systolic function was not well visualized. The right  ventricular size is normal There is a small (< 1 cm) echodensity on the RV  device lead best seen on image 16.   3. The mitral valve is abnormal. Trivial mitral valve regurgitation. No  evidence of mitral stenosis. Moderate mitral annular calcification.   4. The aortic valve is tricuspid. Aortic valve regurgitation  is not  visualized. Mild to moderate aortic valve sclerosis/calcification is  present, without any evidence of aortic stenosis.   5. The inferior vena cava is dilated in size with <50% respiratory  variability, suggesting right atrial pressure of 15 mmHg.   Comparison(s): No prior Echocardiogram.   Per Care Everywhere: Coronary angiography 04/30/2018: PROCEDURES PERFORMED:  --  Left heart catheterization with ventriculography. --  Left coronary angiography. --  Saphenous vein graft angiography. --  LIMA graft angiography. --  Root aortography. --  US Guidance Vascular Access. --  Pressure Wire procedure. --  Intervention on OM1: percutaneous intervention, myocardial flow reserve.  INDICATIONS: Angina/MI: unstable angina, CCS class IV. Abnormal stress test.  SUMMARY:  --  CORONARY CIRCULATION: --  Proximal LAD: There was a diffuse 80 % stenosis. The lesion was heavily calcified. --  Mid circumflex: There was a 75 % stenosis. --  1st obtuse marginal: The vessel was small to medium sized. Angiography showed moderate atherosclerosis. There was a diffuse 70 % stenosis. There was TIMI grade 3 flow through the vessel (brisk flow). --  Proximal RCA: There was a 100 % stenosis. --  Graft to the mid LAD: The graft was a patent LIMA. --  Graft to the 3rd obtuse marginal: The graft was a patent saphenous vein graft. --  Graft to the RPDA: The graft was a patent saphenous vein graft.  --  1ST LESION INTERVENTIONS: --  A percutaneous intervention with myocardial flow reserve measurement was performed on the lesion in the 1st obtuse marginal.  IMPRESSIONS: Stable 3 vessel CAD Absent LN LAD ca++ difuse 80% Cx om1 small 60-70% --> neg DFR RCA CTO  Patent 3 of 3 grafts LIMA to LAD SVG to RCA -PDA SVG to distal Cx-PL  For MOD SED see prelim post op note  CORONARY CIRCULATION: The coronary circulation is right dominant. 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 Proximal LAD: There was a diffuse 80 % stenosis. The lesion was heavily calcified. Mid  circumflex: The vessel was small to medium sized. There was a 75 % stenosis. 1st obtuse marginal: The vessel was small to medium sized. Angiography showed moderate atherosclerosis. There was a diffuse 70 % stenosis. There was TIMI grade 3 flow through the vessel (brisk flow). Proximal RCA: There was a 100 % stenosis. Right PDA: The vessel was small sized. Graft to the mid LAD: The graft was a patent LIMA. Graft to the 3rd obtuse marginal: The graft was a patent saphenous vein graft. Graft to the RPDA: The graft was a patent saphenous vein graft.  PROCEDURE: The risks and alternatives of the procedures and conscious sedation were explained to the patient and informed consent was obtained. The patient was brought to the cath lab and placed on the table. The planned puncture sites were prepped and draped in the usual sterile fashion.  --  Right femoral artery access. The puncture site was infiltrated with 1 % lidocaine. The vessel was accessed using the modified Seldinger technique, a wire was threaded into the vessel, and a sheath was advanced over the wire into the vessel.  --  Left heart catheterization. A catheter was advanced to the ascending aorta. After recording ascending aortic pressure, the catheter was advanced across the aortic valve and left ventricular pressure was recorded. Ventriculography was performed using power injection of contrast agent. Imaging was performed using an RAO projection. Post-ventriculography LV pressure was obtained. The catheter was gradually withdrawn into the aorta under continuous pressure monitoring and aortic pressure was recorded.  --  Left coronary artery angiography. A catheter was advanced to the aorta and positioned in the vessel ostium under fluoroscopic guidance.  --  Saphenous vein graft angiography. A  catheter was advanced to the aorta and positioned at the aortic anastomosis of the graft under fluoroscopic guidance.  --  Left internal mammary graft angiography. A catheter was advanced to the subclavian artery and positioned at the vessel origin under fluoroscopic guidance.  --  Root aortography. A catheter was positioned and contrast was injected.  --  US Guidance Vascular Access.  --  Pressure Wire procedure.  LESION INTERVENTION: A percutaneous intervention with myocardial flow reserve measurement was performed on the lesion in the 1st obtuse marginal. There was TIMI 3 flow before the procedure and TIMI 3 flow after the procedure. There was no acute vessel closure. There was no perforation. There was no dissection.  --  Sheath exchange. The sheath was exchanged for a 75F 23cm.  --  Vessel setup was performed. A 75F [id number] guiding catheter was used to cannulate the vessel.  --  Myocardial Fractional Flow Reserve (FFR) measurement was performed using a 0.014" pressure-monitoring Comet Pressure Wire guide-wire. Steady baseline values were obtained. Mean arterial pressure and mean distal coronary pressures were then obtained at maximum hyperemia.  COMPLICATIONS: No complications occurred during the cath lab visit.    Patient Profile     65 y.o. male with complex history, notable for ESRD on HD, anemia of chronic diease, type 2 diabetes, paroxysmal atrial fibrillation/flutter, COPD, OSA on BiPAP, chronic hypoxic respiratory failure on home O2 who has had a complicated hospital course for right thigh abscess. Cardiology asked to evaluate atrial fibrillation/flutter in consultation at the request of Dr. Tyrell Antonio  Assessment & Plan    Paroxsymal atrial fibrillation Atypical atrial flutter/atrial tachycardia -per care everywhere, history of CTI and ablation at [facility] 06/2017. Noted to have SVT with aberrancy, started on amiodarone and beta blocker in 04/2018. Long pattern of no  shows/cancelled appts. Based on available notes, this has been an issue for at least the last three years -based on his recent telemetry, with the sharp high/low plateau pattern of heart rate, this appears to be most consistent with an atrial tachycardia or atypical flutter. Brief strips look more like afib, without clear flutter waves, but overall heart rate pattern more consistent with atach/atypical flutter -see extensive risk/benefit discussion in note from 02/23/21. CT without evidence of thrombus. High risk for TEE, so planned for cardioversion today, but he ate breakfast. Will plan for tomorrow. Discussed his case with Dr. Oval Linsey, who will be doing the procedure tomorrow.  -If he does not hold/convert, we do not have significant other options for management in the short term. Ablation is elevated risk given that this would require anesthesia, but if cardioversion unsuccessful would have EP weigh in.  Cardiomyopathy, ischemic etiology: -no prior echo in our system. Echo 8/15 showed EF 30-35%. On review of Care Everywhere, this is similar to an echo he had done at [facility] 09/17/[facility].  -only GDMT he can tolerate is low dose metoprolol. With chronic hypotension and ESRD, no other medications are appropriate -dual chamber ICD in place, per Care Everywhere this is a [employer]. Requested interrogation yesterday, I cannot see results, will follow up on this today  CAD with history of CABGx3 in 2016 (LIMA-LAD, SVG-PDA, SVG-Lcx) PAD -continue clopidogrel, on apixaban as above -continue atorvastatin -denies pain  ESRD on HD Anemia of chronic disease Type II diabetes -per primary team  Buford Dresser, MD, PhD, [place]    For questions or updates, please contact [place] Please consult [email or web address] for contact info under     Signed, Buford Dresser, MD  02/24/2021, 12:01 PM

## 2021-02-24 NOTE — Progress Notes (Signed)
Occupational Therapy Treatment Patient Details Name: Kyle Walton MRN: [id number] DOB: 01-14-1956 Today's Date: 02/24/2021    History of present illness Pt is a 65 year old man admitted on 02/20/2021 with non healing R thigh wound now s/p I&D on 8/11. He was admitted in July R thigh infection with hospital course complicated by post op acute respiratior failure, he discharged to SNF for rehab. PMH: ESRD, DM2, morbid obesity, CHF, PAF, chronic hypoxic respiratory failure and hypothyroidism.   OT comments  Pt remains highly motivated, but limited session to rolling for pericare and to change linens and sitting EOB  briefly due to pain in buttocks. HR in low 120s regardless of activity, Sp02 88% on RA, 95% on 2L. Pt likely to need extended period of rehab.   Follow Up Recommendations  SNF;LTACH    Equipment Recommendations  None recommended by OT    Recommendations for Other Services      Precautions / Restrictions Precautions Precautions: Fall Precaution Comments: watch HR and 02       Mobility Bed Mobility Overal bed mobility: Needs Assistance Bed Mobility: Rolling;Sidelying to Sit;Sit to Sidelying Rolling: Max assist Sidelying to sit: +2 for physical assistance;Max assist     Sit to sidelying: +2 for physical assistance;Max assist General bed mobility comments: rolled multiple times for pericare and to change linen, assist for LEs over EOB and to raise trunk with increased time and effort, guided trunk and assist for LEs back into bed    Transfers                 General transfer comment: deferred due to elevated HR    Balance Overall balance assessment: Needs assistance Sitting-balance support: Feet supported;Bilateral upper extremity supported Sitting balance-Leahy Scale: Fair Sitting balance - Comments: fair static balance at EOB, only able to tolerate x 30 secs due to buttocks pain                                   ADL either performed or  assessed with clinical judgement   ADL Overall ADL's : Needs assistance/impaired                     Lower Body Dressing: Total assistance;Bed level       Toileting- Clothing Manipulation and Hygiene: Total assistance;Bed level Toileting - Clothing Manipulation Details (indicate cue type and reason): pt aware of need for pericare, applied butt cream after pericare             Vision       Perception     Praxis      Cognition Arousal/Alertness: Awake/alert Behavior During Therapy: WFL for tasks assessed/performed Overall Cognitive Status: No family/caregiver present to determine baseline cognitive functioning Area of Impairment: Memory;Awareness;Problem solving;Following commands;Attention                   Current Attention Level: Sustained Memory: Decreased short-term memory Following Commands: Follows one step commands with increased time Safety/Judgement: Decreased awareness of deficits   Problem Solving: Slow processing;Decreased initiation;Difficulty sequencing;Requires verbal cues General Comments: pt with high expectations for recovery of mobility        Exercises     Shoulder Instructions       General Comments      Pertinent Vitals/ Pain       Pain Assessment: Faces Faces Pain Scale: Hurts even more Pain Location: buttocks with pericare  Pain Descriptors / Indicators: Discomfort;Sore;Grimacing;Guarding Pain Intervention(s): Monitored during session  Home Living                                          Prior Functioning/Environment              Frequency  Min 2X/week        Progress Toward Goals  OT Goals(current goals can now be found in the care plan section)  Progress towards OT goals: Not progressing toward goals - comment (buttocks pain, elevated HR)  Acute Rehab OT Goals Patient Stated Goal: To be able to walk again OT Goal Formulation: With patient Time For Goal Achievement:  03/02/21 Potential to Achieve Goals: [place] Discharge plan remains appropriate    Co-evaluation    PT/OT/SLP Co-Evaluation/Treatment: Yes Reason for Co-Treatment: For patient/therapist safety   OT goals addressed during session: ADL's and self-care      AM-PAC OT "6 Clicks" Daily Activity     Outcome Measure   Help from another person eating meals?: A Little Help from another person taking care of personal grooming?: A Little Help from another person toileting, which includes using toliet, bedpan, or urinal?: Total Help from another person bathing (including washing, rinsing, drying)?: A Lot Help from another person to put on and taking off regular upper body clothing?: A Lot Help from another person to put on and taking off regular lower body clothing?: Total 6 Click Score: 12    End of Session Equipment Utilized During Treatment: Oxygen  OT Visit Diagnosis: Muscle weakness (generalized) (M62.81);Pain;Other symptoms and signs involving cognitive function   Activity Tolerance Treatment limited secondary to medical complications (Comment) (Pt with HR in 120s with and without activity)   Patient Left in bed;with call bell/phone within reach;with bed alarm set   Nurse Communication          Time: [id number] OT Time Calculation (min): 30 min  Charges: OT General Charges $OT Visit: 1 Visit OT Treatments $Therapeutic Activity: 8-22 mins  Nestor Lewandowsky, OTR/L Acute Rehabilitation Services Pager: [phone] Office: [phone]   [name] 02/24/2021, 11:22 AM

## 2021-02-24 NOTE — Progress Notes (Signed)
[facility] Progress Note   Subjective:    Seen in room.  Had dialysis yesterday but didn't tolerate UF d/t hypotension  Possible cardioversion tomorrow per patient.   Objective Vitals:   02/24/21 0554 02/24/21 0609 02/24/21 0725 02/24/21 0814  BP:  (!) 91/54 (!) 85/51 (!) 87/52  Pulse:   99   Resp:   20   Temp:   (!) 97.5 F (36.4 C)   TempSrc:   Oral   SpO2:   97%   Weight: 106.2 kg     Height:       Physical Exam General: Chronically ill-appearing male; NAD Heart: Tachy, regular No murmurs, rubs, and gallops Lungs: Cleart throughout; No wheezing, rales, or rhonchi Abdomen: No ABD edema, soft, non-tender Extremities:  Trace LE edema. Wound vac to RLE in place-no drainage noted. Dialysis Access: [facility]   Filed Weights   02/23/21 1035 02/23/21 1445 02/24/21 0554  Weight: 105.4 kg 111.4 kg 106.2 kg    Intake/Output Summary (Last 24 hours) at 02/24/2021 0954 Last data filed at 02/23/2021 1445 Gross per 24 hour  Intake --  Output -454 ml  Net 454 ml     Additional Objective Labs: Basic Metabolic Panel: Recent Labs  Lab 02/18/21 1350 02/21/21 1237 02/23/21 1051  NA 134* 134* 135  K 3.3* 3.7 3.6  CL 97* 102 100  CO2 26 18* 22  GLUCOSE 109* 166* 214*  BUN 19 56* 46*  CREATININE 3.33* 7.21* 5.75*  CALCIUM 7.8* 7.6* 7.7*  PHOS 3.1 6.5* 5.6*    Liver Function Tests: Recent Labs  Lab 02/18/21 1350 02/21/21 1237 02/23/21 1051  ALBUMIN 2.0* 1.7* 1.6*    No results for input(s): LIPASE, AMYLASE in the last 168 hours. CBC: Recent Labs  Lab 02/19/21 0043 02/20/21 0104 02/21/21 1237 02/22/21 0730 02/23/21 1052  WBC 11.7* 10.7* 13.8* 12.6* 12.7*  HGB 7.5* 7.9* 7.4* 8.9* 8.2*  HCT 25.5* 25.6* 24.9* 28.4* 26.9*  MCV 93.4 93.1 93.3 91.0 93.7  PLT 228 186 211 229 228    Blood Culture    Component Value Date/Time   SDES BLOOD RIGHT ANTECUBITAL 02/14/2021 1110   SPECREQUEST  02/08/2021 1110    BOTTLES DRAWN AEROBIC ONLY Blood  Culture adequate volume   CULT  02/16/2021 1110    NO GROWTH 5 DAYS Performed at [facility], [place] [address] [place], [place] [address]    REPTSTATUS 02/12/2021 FINAL 03/02/2021 1110    Cardiac Enzymes: No results for input(s): CKTOTAL, CKMB, CKMBINDEX, TROPONINI in the last 168 hours. CBG: Recent Labs  Lab 02/22/21 2138 02/23/21 0812 02/23/21 1717 02/23/21 2006 02/24/21 0832  GLUCAP 156* 160* 127* 148* 177*    Iron Studies: No results for input(s): IRON, TIBC, TRANSFERRIN, FERRITIN in the last 72 hours. Lab Results  Component Value Date   INR 1.9 (H) 03/04/2021   INR 2.6 (H) 01/18/2021   INR 1.5 (H) 12/04/2020   Studies/Results: CT CARDIAC MORPH/PULM VEIN W/CM&W/O CA SCORE  Addendum Date: 02/24/2021   ADDENDUM REPORT: 02/24/2021 09:36 EXAM: OVER-READ INTERPRETATION  CT CHEST The following report is an over-read performed by radiologist Dr. Alvino Blood [facility] on 02/24/2021. This over-read does not include interpretation of cardiac or coronary anatomy or pathology. The coronary CTA interpretation by the cardiologist is attached. COMPARISON:  None. FINDINGS: Limited view of the lung parenchyma demonstrates bibasilar atelectasis amd trace effusions. Airways are normal. Limited view of the mediastinum demonstrates no adenopathy. Limited view of the  upper abdomen demonstrates small amount of high-density material within the esophagus which would indicate oral contrast. Limited view of the skeleton and chest wall is unremarkable. IMPRESSION: 1. Moderate basilar atelectasis trace effusions. 2. New simple intraperitoneal free fluid. 3. High-density material within the esophagus suggest oral contrast. Recommend clinical correlation. These results will be called to the ordering clinician or representative by the Radiologist Assistant, and communication documented in the PACS or [employer]. Electronically Signed   By: Suzy Bouchard M.D.   On: 02/24/2021  09:36   Result Date: 02/24/2021 CLINICAL DATA:  Pre Cardioversion EXAM: Cardiac Gated CTA TECHNIQUE: The patient was scanned on a Siemens Force [id number] slice scanner. Gantry rotation speed was 250 msec with a temporal resolution of 66 msec. A prospective scan was triggered in the ascending thoracic aorta at 140 HU's Data sets were reconstructed with full mA between 35% and 75% of the R-R interval Images were reviewed using VRT, MIP and MPR modes. Double oblique images were used to measure the PV diameter and areas. The patient received 80 cc of contrast at 5 cc/sec CONTRAST:  Isovue 370 total 80 cc COMPARISON:  None FINDINGS: Calcium score not done previous CABG LIMA patent to LAD SVG patent to PDA SVG patent to OM AICD wires noted in RA/RV Mild LAE Normal RA No PFO/ASD No pericardial effusion LAA large with Broccoli type morphology No LAA thrombus LUPV:  Ostium 14 mm LLPV:   Ostium 16.7 mm RUPV:  Ostium 16.3 mm RLPV:  Ostium 17.2 mm IMPRESSION: 1.  Mild LAE Normal RA No LAA thrombus 2.  Patent SVG;s to PDA/OM Patent LIMA to LAD 3.  No ASD/PFO 4.  Normal PV anatomy 5.  AICD wires noted in RA/RV 6.  Normal aortic root 3.2 cm Jenkins Rouge MD [facility] Electronically Signed: By: Jenkins Rouge M.D. On: 02/23/2021 18:53    Medications:  ceFEPime (MAXIPIME) IV 1 g (02/23/21 1719)   lactated ringers Stopped (02/21/21 1532)    (feeding supplement) PROSource Plus  30 mL Oral BID BM   amiodarone  200 mg Oral BID   apixaban  5 mg Oral BID   atorvastatin  40 mg Oral QPM   chlorhexidine  15 mL Mouth Rinse BID   Chlorhexidine Gluconate Cloth  6 each Topical Q0600   cholestyramine light  4 g Oral BID   clopidogrel  75 mg Oral Daily   darbepoetin (ARANESP) injection - DIALYSIS  200 mcg Intravenous Q Wed-HD   docusate sodium  100 mg Oral BID   feeding supplement (NEPRO CARB STEADY)  237 mL Oral Q24H   fentaNYL  1 patch Transdermal Q72H   ferric citrate  420 mg Oral BID WC   Gerhardt's butt cream   Topical TID   insulin  aspart  0-5 Units Subcutaneous QHS   insulin aspart  0-9 Units Subcutaneous TID WC   insulin glargine-yfgn  5 Units Subcutaneous QHS   latanoprost  1 drop Both Eyes QHS   levothyroxine  25 mcg Oral QAC breakfast   mouth rinse  15 mL Mouth Rinse q12n4p   metoprolol succinate  12.5 mg Oral Daily   midodrine  10 mg Oral TID WC   multivitamin  1 tablet Oral QHS   pantoprazole  40 mg Oral Q0600   pregabalin  25 mg Oral BID   saccharomyces boulardii  250 mg Oral BID    Dialysis Orders: [facility]: MWF 4 hrs F250 450/800 3.0K/2.5 Ca LIJ TDC  EDW 111 kg -Heparin 4900  units IV initial bolus, 500 units hourly, DC last hour of tx  Assessment/Plan: R. Thigh Abscess: s/p I&D on 8/11. Wound Vac off. Per primary. Continue cefepime with HD until 03/03/21  ESRD - On HD MWF.  Next HD 8/19 Hypotension/volume-Have attempted to challenge and lower EDW since admit. Using Midodrine with extra 10 mg PO mid run to offset intradialyic hypotension. Continue lowering volume as tolerated. Not tolerating much UF.  AFIB: On amiodarone, apixaban and low-dose metoprolol. Cardiology following -plans to go ahead with cardioversion  Anemia  - Hgb 8.2 Transfused 1 U prbc on 8/15.  Aranesp increased to 200 q Wed starting 8/17.  Metabolic bone disease - Corr Ca/Phos ok. Continue Auryxia 2 tab PO BID  Nutrition - Renal/Carb mod diet with protein supps, renal vits. CM EF 30-35% ICD in place  DMT2-per primary Hypothyroidism-per primary Disposition: Pending    [name] PA-C [facility] 02/24/2021,9:54 AM

## 2021-02-25 ENCOUNTER — Inpatient Hospital Stay (HOSPITAL_COMMUNITY): Payer: Medicare HMO | Admitting: Anesthesiology

## 2021-02-25 ENCOUNTER — Ambulatory Visit: Payer: Medicare HMO | Admitting: Student

## 2021-02-25 ENCOUNTER — Encounter (HOSPITAL_COMMUNITY): Admission: EM | Disposition: E | Payer: Self-pay | Source: Skilled Nursing Facility | Attending: Internal Medicine

## 2021-02-25 ENCOUNTER — Encounter (HOSPITAL_COMMUNITY): Payer: Self-pay | Admitting: Family Medicine

## 2021-02-25 DIAGNOSIS — I4819 Other persistent atrial fibrillation: Secondary | ICD-10-CM

## 2021-02-25 DIAGNOSIS — I255 Ischemic cardiomyopathy: Secondary | ICD-10-CM | POA: Diagnosis not present

## 2021-02-25 DIAGNOSIS — I484 Atypical atrial flutter: Secondary | ICD-10-CM | POA: Diagnosis not present

## 2021-02-25 HISTORY — PX: CARDIOVERSION: SHX1299

## 2021-02-25 LAB — GLUCOSE, CAPILLARY
Glucose-Capillary: 112 mg/dL — ABNORMAL HIGH (ref 70–99)
Glucose-Capillary: 126 mg/dL — ABNORMAL HIGH (ref 70–99)
Glucose-Capillary: 153 mg/dL — ABNORMAL HIGH (ref 70–99)

## 2021-02-25 LAB — CBC
HCT: 27.8 % — ABNORMAL LOW (ref 39.0–52.0)
Hemoglobin: 8.3 g/dL — ABNORMAL LOW (ref 13.0–17.0)
MCH: 28.4 pg (ref 26.0–34.0)
MCHC: 29.9 g/dL — ABNORMAL LOW (ref 30.0–36.0)
MCV: 95.2 fL (ref 80.0–100.0)
Platelets: 222 10*3/uL (ref 150–400)
RBC: 2.92 MIL/uL — ABNORMAL LOW (ref 4.22–5.81)
RDW: 19.9 % — ABNORMAL HIGH (ref 11.5–15.5)
WBC: 10.6 10*3/uL — ABNORMAL HIGH (ref 4.0–10.5)
nRBC: 0 % (ref 0.0–0.2)

## 2021-02-25 LAB — RENAL FUNCTION PANEL
Albumin: 1.6 g/dL — ABNORMAL LOW (ref 3.5–5.0)
Anion gap: 11 (ref 5–15)
BUN: 31 mg/dL — ABNORMAL HIGH (ref 8–23)
CO2: 25 mmol/L (ref 22–32)
Calcium: 8.1 mg/dL — ABNORMAL LOW (ref 8.9–10.3)
Chloride: 99 mmol/L (ref 98–111)
Creatinine, Ser: 4.7 mg/dL — ABNORMAL HIGH (ref 0.61–1.24)
GFR, Estimated: 13 mL/min — ABNORMAL LOW (ref >60)
Glucose, Bld: 182 mg/dL — ABNORMAL HIGH (ref 70–99)
Phosphorus: 4.2 mg/dL (ref 2.5–4.6)
Potassium: 3.4 mmol/L — ABNORMAL LOW (ref 3.5–5.1)
Sodium: 135 mmol/L (ref 135–145)

## 2021-02-25 SURGERY — CARDIOVERSION
Anesthesia: General

## 2021-02-25 MED ORDER — LIDOCAINE 2% (20 MG/ML) 5 ML SYRINGE
INTRAMUSCULAR | Status: DC | PRN
  Administered 2021-02-25: 60 mg via INTRAVENOUS

## 2021-02-25 MED ORDER — PHENYLEPHRINE 40 MCG/ML (10ML) SYRINGE FOR IV PUSH (FOR BLOOD PRESSURE SUPPORT)
PREFILLED_SYRINGE | INTRAVENOUS | Status: DC | PRN
  Administered 2021-02-25: 120 ug via INTRAVENOUS

## 2021-02-25 MED ORDER — PROPOFOL 10 MG/ML IV BOLUS
INTRAVENOUS | Status: DC | PRN
  Administered 2021-02-25: 40 mg via INTRAVENOUS

## 2021-02-25 MED ORDER — SODIUM CHLORIDE 0.9 % IV SOLN: 100.0000 mL | INTRAVENOUS | Status: DC | PRN

## 2021-02-25 MED ORDER — HEPARIN SODIUM (PORCINE) 1000 UNIT/ML DIALYSIS
1000.0000 [IU] | INTRAMUSCULAR | Status: DC | PRN
  Administered 2021-02-25: 1000 [IU] via INTRAVENOUS_CENTRAL

## 2021-02-25 MED ORDER — HEPARIN SODIUM (PORCINE) 1000 UNIT/ML DIALYSIS: 20.0000 [IU]/kg | INTRAMUSCULAR | Status: DC | PRN

## 2021-02-25 MED ORDER — PENTAFLUOROPROP-TETRAFLUOROETH EX AERO: 1.0000 "application " | INHALATION_SPRAY | CUTANEOUS | Status: DC | PRN

## 2021-02-25 MED ORDER — SODIUM CHLORIDE 0.9 % IV SOLN: INTRAVENOUS | Status: DC | PRN

## 2021-02-25 MED ORDER — LIDOCAINE-PRILOCAINE 2.5-2.5 % EX CREA: 1.0000 "application " | TOPICAL_CREAM | CUTANEOUS | Status: DC | PRN

## 2021-02-25 MED ORDER — ALTEPLASE 2 MG IJ SOLR: 2.0000 mg | Freq: Once | INTRAMUSCULAR | Status: DC | PRN

## 2021-02-25 MED ORDER — LIDOCAINE HCL (PF) 1 % IJ SOLN: 5.0000 mL | INTRAMUSCULAR | Status: DC | PRN

## 2021-02-25 NOTE — Progress Notes (Signed)
Patient went back into irregular after returning to the floor. New rhythm looks different from the precardioversion rhythm. Called med[employer] rep who interrogated the device in th presence of Dr. Harrell Gave and Dr. Audie Box. 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 He was paced out of AVNRT in the presence of Dr. Audie Box and Dr. Harrell Gave. He is now in NSR.

## 2021-02-25 NOTE — Progress Notes (Signed)
Pt off floor to cardioversion

## 2021-02-25 NOTE — Progress Notes (Signed)
Pts heart rate tachycardic. Pt asymptomatic. Cardiology PA and Ghimire MD made aware via secure chat.

## 2021-02-25 NOTE — Progress Notes (Signed)
Pt off floor.

## 2021-02-25 NOTE — Anesthesia Preprocedure Evaluation (Signed)
Anesthesia Evaluation  Patient identified by MRN, date of birth, ID band Patient awake    Reviewed: Allergy & Precautions, NPO status , Patient's Chart, lab work & pertinent test results, reviewed documented beta blocker date and time   Airway Mallampati: III  TM Distance: >3 FB Neck ROM: Full  Mouth opening: Limited Mouth Opening  Dental  (+) Chipped, Dental Advisory Given,    Pulmonary sleep apnea ,    Pulmonary exam normal breath sounds clear to auscultation       Cardiovascular hypertension, Pt. on home beta blockers and Pt. on medications + CAD, + Past MI, + CABG, + Peripheral Vascular Disease, +CHF and + DVT  Normal cardiovascular exam+ dysrhythmias Atrial Fibrillation + Cardiac Defibrillator  Rhythm:Irregular Rate:Normal  TTE 2022 There is mild to moderate global hypokinesis of the left ventricle.  LV ejection fraction = 30-35%.  Mild left ventricular hypertrophy  There is trace aortic regurgitation.  There is trace mitral regurgitation.  Mild pulmonary hypertension.  There is trace tricuspid regurgitation.  There is no pericardial effusion.  Difficulty to compare with the prior study due to different image  quality, Probably no significant change  Image Quality: Technically difficult.      Neuro/Psych negative neurological ROS  negative psych ROS   GI/Hepatic negative GI ROS, Neg liver ROS,   Endo/Other  diabetes, Type 2, Insulin DependentHypothyroidism   Renal/GU ESRF and DialysisRenal disease  negative genitourinary   Musculoskeletal negative musculoskeletal ROS (+)   Abdominal   Peds  Hematology  (+) Blood dyscrasia (on eliquis, plavix, ), anemia ,   Anesthesia Other Findings   Reproductive/Obstetrics                             Anesthesia Physical  Anesthesia Plan  ASA: 4  Anesthesia Plan: General   Post-op Pain Management:    Induction: Intravenous  PONV Risk  Score and Plan: Propofol infusion  Airway Management Planned:   Additional Equipment:   Intra-op Plan:   Post-operative Plan:   Informed Consent: I have reviewed the patients History and Physical, chart, labs and discussed the procedure including the risks, benefits and alternatives for the proposed anesthesia with the patient or authorized representative who has indicated his/her understanding and acceptance.     Dental advisory given  Plan Discussed with: CRNA and Anesthesiologist  Anesthesia Plan Comments:         Anesthesia Quick Evaluation

## 2021-02-25 NOTE — Anesthesia Postprocedure Evaluation (Signed)
Anesthesia Post Note  Patient: Kyle Walton  Procedure(s) Performed: CARDIOVERSION     Patient location during evaluation: Endoscopy Anesthesia Type: General Level of consciousness: awake and alert Pain management: pain level controlled Vital Signs Assessment: post-procedure vital signs reviewed and stable Respiratory status: spontaneous breathing, nonlabored ventilation, respiratory function stable and patient connected to nasal cannula oxygen Cardiovascular status: blood pressure returned to baseline and stable Postop Assessment: no apparent nausea or vomiting Anesthetic complications: no   No notable events documented.  Last Vitals:  Vitals:   03/08/2021 1442 03/09/2021 1452  BP: (!) 85/36 (!) 99/37  Pulse: 65 70  Resp: 18 18  Temp:    SpO2: 100% 99%    Last Pain:  Vitals:   02/12/2021 1452  TempSrc:   PainSc: 9                  Marleigh Kaylor,W. EDMOND

## 2021-02-25 NOTE — H&P (View-Only) (Signed)
Progress Note  Patient Name: Rishawn Mcewan Date of Encounter: 02/23/2021  [facility] Cardiologist: Lauree Chandler, MD   Subjective   Denies any CP or SOB.   Inpatient Medications    Scheduled Meds:  (feeding supplement) PROSource Plus  30 mL Oral BID BM   amiodarone  200 mg Oral BID   apixaban  5 mg Oral BID   atorvastatin  40 mg Oral QPM   chlorhexidine  15 mL Mouth Rinse BID   Chlorhexidine Gluconate Cloth  6 each Topical Q0600   cholestyramine light  4 g Oral BID   clopidogrel  75 mg Oral Daily   darbepoetin (ARANESP) injection - DIALYSIS  200 mcg Intravenous Q Wed-HD   docusate sodium  100 mg Oral BID   feeding supplement (NEPRO CARB STEADY)  237 mL Oral Q24H   fentaNYL  1 patch Transdermal Q72H   ferric citrate  420 mg Oral BID WC   Gerhardt's butt cream   Topical TID   insulin aspart  0-5 Units Subcutaneous QHS   insulin aspart  0-9 Units Subcutaneous TID WC   insulin glargine-yfgn  5 Units Subcutaneous QHS   latanoprost  1 drop Both Eyes QHS   levothyroxine  25 mcg Oral QAC breakfast   mouth rinse  15 mL Mouth Rinse q12n4p   metoprolol succinate  12.5 mg Oral Daily   midodrine  10 mg Oral TID WC   multivitamin  1 tablet Oral QHS   pantoprazole  40 mg Oral Q0600   pregabalin  25 mg Oral BID   saccharomyces boulardii  250 mg Oral BID   Continuous Infusions:  [START ON 02/26/2021] sodium chloride     [START ON 02/26/2021] sodium chloride     ceFEPime (MAXIPIME) IV 1 g (02/23/21 1719)   lactated ringers Stopped (02/21/21 1532)   PRN Meds: [START ON 02/26/2021] sodium chloride, [START ON 02/26/2021] sodium chloride, acetaminophen **OR** acetaminophen, [START ON 02/26/2021] alteplase, clonazepam, docusate sodium, [START ON 02/26/2021] heparin, [START ON 02/26/2021] heparin, HYDROmorphone, [START ON 02/26/2021] lidocaine (PF), [START ON 02/26/2021] lidocaine-prilocaine, liver oil-zinc oxide, nitroGLYCERIN, ondansetron **OR** ondansetron (ZOFRAN) IV, [START ON  02/26/2021] pentafluoroprop-tetrafluoroeth, polyethylene glycol   Vital Signs    Vitals:   02/26/2021 0940 02/21/2021 0945 02/15/2021 0950 02/26/2021 1000  BP: (!) 87/15  92/60 104/63  Pulse: (!) 107 (!) 105 (!) 106 (!) 107  Resp: '17 20 20 '$ (!) 23  Temp:      TempSrc:      SpO2: 99% 99% 99% 100%  Weight:      Height:        Intake/Output Summary (Last 24 hours) at 03/06/2021 1054 Last data filed at 02/14/2021 0500 Gross per 24 hour  Intake 120 ml  Output 0 ml  Net 120 ml   Last 3 Weights 03/03/2021 02/12/2021 02/24/2021  Weight (lbs) 235 lb 0.2 oz 232 lb 5.8 oz 234 lb 1.6 oz  Weight (kg) 106.6 kg 105.4 kg 106.187 kg      Telemetry    Atrial fibrillation with HR 100-110s - Personally Reviewed  ECG     Atrial fibrillation- Personally Reviewed  Physical Exam   GEN: No acute distress.  Receiving dialysis Neck: No JVD Cardiac: tachycardic, no murmurs, rubs, or gallops.  Respiratory: Clear to auscultation bilaterally. GI: Soft, nontender, non-distended  MS: No edema; No deformity. Neuro:  Nonfocal  Psych: Normal affect   Labs    High Sensitivity Troponin:  No results for input(s): TROPONINIHS in the last 720  hours.    Chemistry Recent Labs  Lab 02/21/21 1237 02/23/21 1051 02/12/2021 0745  NA 134* 135 135  K 3.7 3.6 3.4*  CL 102 100 99  CO2 18* 22 25  GLUCOSE 166* 214* 182*  BUN 56* 46* 31*  CREATININE 7.21* 5.75* 4.70*  CALCIUM 7.6* 7.7* 8.1*  ALBUMIN 1.7* 1.6* 1.6*  GFRNONAA 8* 10* 13*  ANIONGAP '14 13 11     '$ Hematology Recent Labs  Lab 02/22/21 0730 02/23/21 1052 02/12/2021 0745  WBC 12.6* 12.7* 10.6*  RBC 3.12* 2.87* 2.92*  HGB 8.9* 8.2* 8.3*  HCT 28.4* 26.9* 27.8*  MCV 91.0 93.7 95.2  MCH 28.5 28.6 28.4  MCHC 31.3 30.5 29.9*  RDW 19.0* 19.6* 19.9*  PLT 229 228 222    BNPNo results for input(s): BNP, PROBNP in the last 168 hours.   DDimer No results for input(s): DDIMER in the last 168 hours.   Radiology    CT CARDIAC MORPH/PULM VEIN W/CM&W/O CA  SCORE  Addendum Date: 02/24/2021   ADDENDUM REPORT: 02/24/2021 09:36 EXAM: OVER-READ INTERPRETATION  CT CHEST The following report is an over-read performed by radiologist Dr. Alvino Blood [facility] on 02/24/2021. This over-read does not include interpretation of cardiac or coronary anatomy or pathology. The coronary CTA interpretation by the cardiologist is attached. COMPARISON:  None. FINDINGS: Limited view of the lung parenchyma demonstrates bibasilar atelectasis amd trace effusions. Airways are normal. Limited view of the mediastinum demonstrates no adenopathy. Limited view of the upper abdomen demonstrates small amount of high-density material within the esophagus which would indicate oral contrast. Limited view of the skeleton and chest wall is unremarkable. IMPRESSION: 1. Moderate basilar atelectasis trace effusions. 2. New simple intraperitoneal free fluid. 3. High-density material within the esophagus suggest oral contrast. Recommend clinical correlation. These results will be called to the ordering clinician or representative by the Radiologist Assistant, and communication documented in the PACS or [employer]. Electronically Signed   By: Suzy Bouchard M.D.   On: 02/24/2021 09:36   Result Date: 02/24/2021 CLINICAL DATA:  Pre Cardioversion EXAM: Cardiac Gated CTA TECHNIQUE: The patient was scanned on a Siemens Force [id number] slice scanner. Gantry rotation speed was 250 msec with a temporal resolution of 66 msec. A prospective scan was triggered in the ascending thoracic aorta at 140 HU's Data sets were reconstructed with full mA between 35% and 75% of the R-R interval Images were reviewed using VRT, MIP and MPR modes. Double oblique images were used to measure the PV diameter and areas. The patient received 80 cc of contrast at 5 cc/sec CONTRAST:  Isovue 370 total 80 cc COMPARISON:  None FINDINGS: Calcium score not done previous CABG LIMA patent to LAD SVG patent to PDA SVG patent  to OM AICD wires noted in RA/RV Mild LAE Normal RA No PFO/ASD No pericardial effusion LAA large with Broccoli type morphology No LAA thrombus LUPV:  Ostium 14 mm LLPV:   Ostium 16.7 mm RUPV:  Ostium 16.3 mm RLPV:  Ostium 17.2 mm IMPRESSION: 1.  Mild LAE Normal RA No LAA thrombus 2.  Patent SVG;s to PDA/OM Patent LIMA to LAD 3.  No ASD/PFO 4.  Normal PV anatomy 5.  AICD wires noted in RA/RV 6.  Normal aortic root 3.2 cm Jenkins Rouge MD [facility] Electronically Signed: By: Jenkins Rouge M.D. On: 02/23/2021 18:53    Cardiac Studies   Per Care Everywhere: Coronary angiography 04/30/2018: PROCEDURES PERFORMED:  --  Left heart catheterization with ventriculography. --  Left coronary angiography. --  Saphenous vein graft angiography. --  LIMA graft angiography. --  Root aortography. --  US Guidance Vascular Access. --  Pressure Wire procedure. --  Intervention on OM1: percutaneous intervention, myocardial flow reserve.  INDICATIONS: Angina/MI: unstable angina, CCS class IV. Abnormal stress test.  SUMMARY:  --  CORONARY CIRCULATION: --  Proximal LAD: There was a diffuse 80 % stenosis. The lesion was heavily calcified. --  Mid circumflex: There was a 75 % stenosis. --  1st obtuse marginal: The vessel was small to medium sized. Angiography showed moderate atherosclerosis. There was a diffuse 70 % stenosis. There was TIMI grade 3 flow through the vessel (brisk flow). --  Proximal RCA: There was a 100 % stenosis. --  Graft to the mid LAD: The graft was a patent LIMA. --  Graft to the 3rd obtuse marginal: The graft was a patent saphenous vein graft. --  Graft to the RPDA: The graft was a patent saphenous vein graft.  --  1ST LESION INTERVENTIONS: --  A percutaneous intervention with myocardial flow reserve measurement was performed on the lesion in the 1st obtuse marginal.  IMPRESSIONS: Stable 3 vessel CAD Absent LN LAD ca++ difuse 80% Cx om1 small 60-70% --> neg DFR RCA CTO  Patent 3 of  3 grafts LIMA to LAD SVG to RCA -PDA SVG to distal Cx-PL  For MOD SED see prelim post op note  CORONARY CIRCULATION: The coronary circulation is right dominant. 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 Proximal LAD: There was a diffuse 80 % stenosis. The lesion was heavily calcified. Mid circumflex: The vessel was small to medium sized. There was a 75 % stenosis. 1st obtuse marginal: The vessel was small to medium sized. Angiography showed moderate atherosclerosis. There was a diffuse 70 % stenosis. There was TIMI grade 3 flow through the vessel (brisk flow). Proximal RCA: There was a 100 % stenosis. Right PDA: The vessel was small sized. Graft to the mid LAD: The graft was a patent LIMA. Graft to the 3rd obtuse marginal: The graft was a patent saphenous vein graft. Graft to the RPDA: The graft was a patent saphenous vein graft.  PROCEDURE: The risks and alternatives of the procedures and conscious sedation were explained to the patient and informed consent was obtained. The patient was brought to the cath lab and placed on the table. The planned puncture sites were prepped and draped in the usual sterile fashion.  --  Right femoral artery access. The puncture site was infiltrated with 1 % lidocaine. The vessel was accessed using the modified Seldinger technique, a wire was threaded into the vessel, and a sheath was advanced over the wire into the vessel.  --  Left heart catheterization. A catheter was advanced to the ascending aorta. After recording ascending aortic pressure, the catheter was advanced across the aortic valve and left ventricular pressure was recorded. Ventriculography was performed using power injection of contrast agent. Imaging was performed using an RAO projection. Post-ventriculography LV pressure was obtained. The catheter was gradually withdrawn into the aorta under continuous pressure  monitoring and aortic pressure was recorded.  --  Left coronary artery angiography. A catheter was advanced to the aorta and positioned in the vessel ostium under fluoroscopic guidance.  --  Saphenous vein graft angiography. A catheter was advanced to the aorta and positioned at the aortic anastomosis of the graft under fluoroscopic guidance.  --  Left internal mammary graft angiography.  A catheter was advanced to the subclavian artery and positioned at the vessel origin under fluoroscopic guidance.  --  Root aortography. A catheter was positioned and contrast was injected.  --  US Guidance Vascular Access.  --  Pressure Wire procedure.  LESION INTERVENTION: A percutaneous intervention with myocardial flow reserve measurement was performed on the lesion in the 1st obtuse marginal. There was TIMI 3 flow before the procedure and TIMI 3 flow after the procedure. There was no acute vessel closure. There was no perforation. There was no dissection.  --  Sheath exchange. The sheath was exchanged for a 33F 23cm.  --  Vessel setup was performed. A 33F [id number] guiding catheter was used to cannulate the vessel.  --  Myocardial Fractional Flow Reserve (FFR) measurement was performed using a 0.014" pressure-monitoring Comet Pressure Wire guide-wire. Steady baseline values were obtained. Mean arterial pressure and mean distal coronary pressures were then obtained at maximum hyperemia.  COMPLICATIONS: No complications occurred during the cath lab visit.     Echo 02/21/21  1. Left ventricular ejection fraction, by estimation, is 30 to 35%. The  left ventricle has moderately decreased function. The left ventricle  demonstrates global hypokinesis. There is mild left ventricular  hypertrophy. Left ventricular diastolic  parameters are indeterminate.   2. Right ventricular systolic function was not well visualized. The right  ventricular size is normal There is a small (< 1 cm) echodensity on  the RV  device lead best seen on image 16.   3. The mitral valve is abnormal. Trivial mitral valve regurgitation. No  evidence of mitral stenosis. Moderate mitral annular calcification.   4. The aortic valve is tricuspid. Aortic valve regurgitation is not  visualized. Mild to moderate aortic valve sclerosis/calcification is  present, without any evidence of aortic stenosis.   5. The inferior vena cava is dilated in size with <50% respiratory  variability, suggesting right atrial pressure of 15 mmHg.   Comparison(s): No prior Echocardiogram.     CT cardiac morph 02/23/21 FINDINGS: Calcium score not done previous CABG   LIMA patent to LAD  SVG patent to PDA  SVG patent to OM    AICD wires noted in RA/RV   Mild LAE Normal RA No PFO/ASD No pericardial effusion  LAA large with Broccoli type morphology No LAA thrombus   LUPV:  Ostium 14 mm  LLPV:   Ostium 16.7 mm  RUPV:  Ostium 16.3 mm  RLPV:  Ostium 17.2 mm   IMPRESSION: 1.  Mild LAE Normal RA No LAA thrombus  2.  Patent SVG;s to PDA/OM Patent LIMA to LAD  3.  No ASD/PFO  4.  Normal PV anatomy  5.  AICD wires noted in RA/RV  6.  Normal aortic root 3.2 cm    Patient Profile     65 y.o. male with PMH of ESRD on HD, anemia of chronic diasease, DM II, PAF/flutter, COPD, OSA on BiPAP, chronic hypoxic respiratory failure on home O2 recently discharged in July after treated for R thigh abscess and underwent I&D on 7/14. He returned due to recurrent R thigh abscess and underwent repeat I&D on 8/11. ID recommended continue abx until 8/25. Cardiology following afib with RVR  Assessment & Plan    PAF/flutter with RVR  - h/o CTI and ablation at [facility] 06/2017  - noted to have SVT with aberrancy and started on amio and BB in 04/2018  - outpatient follow up has been poor, lots of no shows  -  high risk for TEE, CT showed no evidence of thrombus. Plan for DCCV (originally scheduled for 8/18, but patient ate)  - if has recurrence of  afib afterward, will involve EP   Chronic systolic heart failure s/p dual chamber ICD  - Echo 02/21/2021 showed EF 30-35%, similar to [facility] at [facility] 09/17/2020  - managed by dialysis.   CAD s/p CABG x3 2016 (LIMA-LAD, SVG-PDA, SVG-LCx)  Chronic hypotension on midodrine  ESRD on HD  Anemia  Recurrent R thigh abscess: underwent repeat I&D 8/11      For questions or updates, please contact [place] Please consult [email or web address] for contact info under        Signed, Almyra Deforest, [place]  02/23/2021, 10:54 AM

## 2021-02-25 NOTE — Progress Notes (Signed)
PROGRESS NOTE    Kyle Walton  [id number] DOB: 12-30-1955 DOA: 02/13/2021 PCP: Patient, No Pcp Per (Inactive)   Brief Narrative: 65 chronically ill with ESRD, anemia of chronic disease, type 2 diabetes, chronic diastolic heart failure, paroxysmal A. fib on Eliquis, COPD chronic hypoxic respiratory failure on 2 L of oxygen, nocturnal BiPAP, hypothyroidism recently discharged from [facility] 7/26-after he was treated for right thigh abscess-he had undergone I&D on 7/14.  He was discharged to SNF on oral antimicrobial therapy-while at SNF-he was noted to have recurrence of his right thigh abscess-hence he was admitted to the hospitalist service.  He was started on broad-spectrum antimicrobial therapy-and underwent repeat I&D on 8/11.  He was evaluated by infectious disease with recommendations to continue with cefepime for 2 weeks with HD until 8/25.  See below for further details.  Subjective: Awake/alert-seen in HD.  Denies any chest pain or shortness of breath.   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  Per Ortho-sutures need to be maintained until 3 weeks postoperative (around 9/1).  PAF/flutter with episodes of RVR: Remains in atrial fibrillation-on oral amiodarone/beta-blocker and Eliquis.  Cardiac CT negative for LA thrombus.  Cardiology planning for cardioversion later today.  Remains on Eliquis.    Chronic systolic heart failure (EF 30-35% by echo on 8/15): Volume status stable-diuresis with HD-has chronic hypotension-and can only tolerate low-dose metoprolol.  Chronic hypotension: Continue midodrine-he is usually asymptomatic.  ESRD on HD TTS: Nephrology following and directing care.  Anemia: Due to ESRD-worsened  by acute illness-continue to follow CBC.  Has required PRBC transfusion.  IV iron/Aranesp defer to nephrology.   History of CAD s/p CABG: Continue Plavix/beta-blocker/statin.  Chronic hypoxic respiratory failure/OSA: Continue BiPAP nightly.  Hypothyroidism: Continue levothyroxine  DM-2 (A1c 7.2 on 8/1): CBG stable-continue Lantus 5 units daily/SSI.    Recent Labs    02/24/21 1158 02/24/21 1550 02/24/21 2116  GLUCAP 194* 178* 146*     Legally blind  History of PAD-s/p right transmetatarsal amputation, and s/p amputation of several toes on the left foot.  Obesity: Estimated body mass index is 33.72 kg/m as calculated from the following:   Height as of this encounter: '5\' 10"'$  (1.778 m).   Weight as of this encounter: 106.6 kg.   DVT prophylaxis: Eliquis Code Status: Full Code Family Communication: None at bedside-we will discuss with family over the next few days. Disposition Plan:  Status is: Inpatient  Remains inpatient appropriate because:IV treatments appropriate due to intensity of illness or inability to take PO  Dispo: The patient is from: SNF              Anticipated d/c is to: SNF (insurance refused LTAC)              Patient currently is not medically stable to d/c.   Difficult to place patient No   Consultants:  Ortho Nephrology Cardiology  Procedures:  I and D   Antimicrobials:     Objective: Vitals:   02/16/2021 0950 02/11/2021 1000 02/08/2021 1030 03/04/2021 1100  BP: 92/60 104/63 97/61 (!) 88/52  Pulse: (!) 106 (!) 107 (!) 106 (!) 103  Resp: 20 (!) 23 (!) 23 20  Temp:      TempSrc:      SpO2: 99% 100% 100% 98%  Weight:  Height:        Intake/Output Summary (Last 24 hours) at 02/16/2021 1135 Last data filed at 02/09/2021 0500 Gross per 24 hour  Intake 120 ml  Output 0 ml  Net 120 ml    Filed Weights   02/24/21 0554 02/14/2021 0500 03/03/2021 0746  Weight: 106.2 kg 105.4 kg 106.6 kg    Examination: Gen Exam:Alert awake-not in any  distress HEENT:atraumatic, normocephalic Chest: B/L clear to auscultation anteriorly CVS:S1S2 regular Abdomen:soft non tender, non distended Extremities:no edema Neurology: Non focal Skin: no rash    Data Reviewed: I have personally reviewed following labs and imaging studies  CBC: Recent Labs  Lab 02/20/21 0104 02/21/21 1237 02/22/21 0730 02/23/21 1052 02/27/2021 0745  WBC 10.7* 13.8* 12.6* 12.7* 10.6*  HGB 7.9* 7.4* 8.9* 8.2* 8.3*  HCT 25.6* 24.9* 28.4* 26.9* 27.8*  MCV 93.1 93.3 91.0 93.7 95.2  PLT 186 211 229 228 [id number]    Basic Metabolic Panel: Recent Labs  Lab 02/18/21 1350 02/21/21 1237 02/22/21 0730 02/23/21 1051 02/26/2021 0745  NA 134* 134*  --  135 135  K 3.3* 3.7  --  3.6 3.4*  CL 97* 102  --  100 99  CO2 26 18*  --  22 25  GLUCOSE 109* 166*  --  214* 182*  BUN 19 56*  --  46* 31*  CREATININE 3.33* 7.21*  --  5.75* 4.70*  CALCIUM 7.8* 7.6*  --  7.7* 8.1*  MG  --   --  1.6*  --   --   PHOS 3.1 6.5*  --  5.6* 4.2    GFR: Estimated Creatinine Clearance: 19.1 mL/min (A) (by C-G formula based on SCr of 4.7 mg/dL (H)). Liver Function Tests: Recent Labs  Lab 02/18/21 1350 02/21/21 1237 02/23/21 1051 03/04/2021 0745  ALBUMIN 2.0* 1.7* 1.6* 1.6*    No results for input(s): LIPASE, AMYLASE in the last 168 hours. No results for input(s): AMMONIA in the last 168 hours. Coagulation Profile: No results for input(s): INR, PROTIME in the last 168 hours. Cardiac Enzymes: No results for input(s): CKTOTAL, CKMB, CKMBINDEX, TROPONINI in the last 168 hours. BNP (last 3 results) No results for input(s): PROBNP in the last 8760 hours. HbA1C: No results for input(s): HGBA1C in the last 72 hours. CBG: Recent Labs  Lab 02/23/21 2006 02/24/21 0832 02/24/21 1158 02/24/21 1550 02/24/21 2116  GLUCAP 148* 177* 194* 178* 146*    Lipid Profile: No results for input(s): CHOL, HDL, LDLCALC, TRIG, CHOLHDL, LDLDIRECT in the last 72 hours. Thyroid Function Tests: No  results for input(s): TSH, T4TOTAL, FREET4, T3FREE, THYROIDAB in the last 72 hours. Anemia Panel: No results for input(s): VITAMINB12, FOLATE, FERRITIN, TIBC, IRON, RETICCTPCT in the last 72 hours. Sepsis Labs: Recent Labs  Lab 02/21/21 1237  LATICACIDVEN 1.1     Recent Results (from the past 240 hour(s))  Surgical PCR screen     Status: Abnormal   Collection Time: 02/16/21 11:19 PM   Specimen: Nasal Mucosa; Nasal Swab  Result Value Ref Range Status   MRSA, PCR NEGATIVE NEGATIVE Final   Staphylococcus aureus POSITIVE (A) NEGATIVE Final    Comment: (NOTE) The Xpert SA Assay (FDA approved for NASAL specimens in patients 65 years of age and older), is one component of a comprehensive surveillance program. It is not intended to diagnose infection nor to guide or monitor treatment. Performed at [facility], [place] [address], [place], [place] [address]  Radiology Studies: CT CARDIAC MORPH/PULM VEIN W/CM&W/O CA SCORE  Addendum Date: 02/24/2021   ADDENDUM REPORT: 02/24/2021 09:36 EXAM: OVER-READ INTERPRETATION  CT CHEST The following report is an over-read performed by radiologist Dr. Alvino Blood [facility] on 02/24/2021. This over-read does not include interpretation of cardiac or coronary anatomy or pathology. The coronary CTA interpretation by the cardiologist is attached. COMPARISON:  None. FINDINGS: Limited view of the lung parenchyma demonstrates bibasilar atelectasis amd trace effusions. Airways are normal. Limited view of the mediastinum demonstrates no adenopathy. Limited view of the upper abdomen demonstrates small amount of high-density material within the esophagus which would indicate oral contrast. Limited view of the skeleton and chest wall is unremarkable. IMPRESSION: 1. Moderate basilar atelectasis trace effusions. 2. New simple intraperitoneal free fluid. 3. High-density material within the esophagus suggest oral contrast. Recommend  clinical correlation. These results will be called to the ordering clinician or representative by the Radiologist Assistant, and communication documented in the PACS or [employer]. Electronically Signed   By: Suzy Bouchard M.D.   On: 02/24/2021 09:36   Result Date: 02/24/2021 CLINICAL DATA:  Pre Cardioversion EXAM: Cardiac Gated CTA TECHNIQUE: The patient was scanned on a Siemens Force [id number] slice scanner. Gantry rotation speed was 250 msec with a temporal resolution of 66 msec. A prospective scan was triggered in the ascending thoracic aorta at 140 HU's Data sets were reconstructed with full mA between 35% and 75% of the R-R interval Images were reviewed using VRT, MIP and MPR modes. Double oblique images were used to measure the PV diameter and areas. The patient received 80 cc of contrast at 5 cc/sec CONTRAST:  Isovue 370 total 80 cc COMPARISON:  None FINDINGS: Calcium score not done previous CABG LIMA patent to LAD SVG patent to PDA SVG patent to OM AICD wires noted in RA/RV Mild LAE Normal RA No PFO/ASD No pericardial effusion LAA large with Broccoli type morphology No LAA thrombus LUPV:  Ostium 14 mm LLPV:   Ostium 16.7 mm RUPV:  Ostium 16.3 mm RLPV:  Ostium 17.2 mm IMPRESSION: 1.  Mild LAE Normal RA No LAA thrombus 2.  Patent SVG;s to PDA/OM Patent LIMA to LAD 3.  No ASD/PFO 4.  Normal PV anatomy 5.  AICD wires noted in RA/RV 6.  Normal aortic root 3.2 cm Jenkins Rouge MD [facility] Electronically Signed: By: Jenkins Rouge M.D. On: 02/23/2021 18:53        Scheduled Meds:  (feeding supplement) PROSource Plus  30 mL Oral BID BM   amiodarone  200 mg Oral BID   apixaban  5 mg Oral BID   atorvastatin  40 mg Oral QPM   chlorhexidine  15 mL Mouth Rinse BID   Chlorhexidine Gluconate Cloth  6 each Topical Q0600   cholestyramine light  4 g Oral BID   clopidogrel  75 mg Oral Daily   darbepoetin (ARANESP) injection - DIALYSIS  200 mcg Intravenous Q Wed-HD   docusate sodium  100 mg Oral BID   feeding  supplement (NEPRO CARB STEADY)  237 mL Oral Q24H   fentaNYL  1 patch Transdermal Q72H   ferric citrate  420 mg Oral BID WC   Gerhardt's butt cream   Topical TID   insulin aspart  0-5 Units Subcutaneous QHS   insulin aspart  0-9 Units Subcutaneous TID WC   insulin glargine-yfgn  5 Units Subcutaneous QHS   latanoprost  1 drop Both Eyes QHS   levothyroxine  25 mcg Oral QAC  breakfast   mouth rinse  15 mL Mouth Rinse q12n4p   metoprolol succinate  12.5 mg Oral Daily   midodrine  10 mg Oral TID WC   multivitamin  1 tablet Oral QHS   pantoprazole  40 mg Oral Q0600   pregabalin  25 mg Oral BID   saccharomyces boulardii  250 mg Oral BID   Continuous Infusions:  [START ON 02/26/2021] sodium chloride     [START ON 02/26/2021] sodium chloride     ceFEPime (MAXIPIME) IV 1 g (02/23/21 1719)   lactated ringers Stopped (02/21/21 1532)     LOS: 18 days    Time spent: 35  minutes.     Oren Binet, MD [facility]   If 7PM-7AM, please contact night-coverage [email or web address]  02/12/2021, 11:35 AM

## 2021-02-25 NOTE — Anesthesia Procedure Notes (Addendum)
Procedure Name: General with mask airway Date/Time: 03/02/2021 2:03 PM Performed by: Rande Brunt, CRNA Pre-anesthesia Checklist: Patient identified, Suction available, Emergency Drugs available and Patient being monitored Patient Re-evaluated:Patient Re-evaluated prior to induction Oxygen Delivery Method: Ambu bag Preoxygenation: Pre-oxygenation with 100% oxygen Induction Type: IV induction Placement Confirmation: positive ETCO2 and CO2 detector Dental Injury: Teeth and Oropharynx as per pre-operative assessment

## 2021-02-25 NOTE — Progress Notes (Signed)
Progress Note  Patient Name: Kyle Walton Date of Encounter: 02/18/2021  [facility] Cardiologist: Lauree Chandler, MD   Subjective   Denies any CP or SOB.   Inpatient Medications    Scheduled Meds:  (feeding supplement) PROSource Plus  30 mL Oral BID BM   amiodarone  200 mg Oral BID   apixaban  5 mg Oral BID   atorvastatin  40 mg Oral QPM   chlorhexidine  15 mL Mouth Rinse BID   Chlorhexidine Gluconate Cloth  6 each Topical Q0600   cholestyramine light  4 g Oral BID   clopidogrel  75 mg Oral Daily   darbepoetin (ARANESP) injection - DIALYSIS  200 mcg Intravenous Q Wed-HD   docusate sodium  100 mg Oral BID   feeding supplement (NEPRO CARB STEADY)  237 mL Oral Q24H   fentaNYL  1 patch Transdermal Q72H   ferric citrate  420 mg Oral BID WC   Gerhardt's butt cream   Topical TID   insulin aspart  0-5 Units Subcutaneous QHS   insulin aspart  0-9 Units Subcutaneous TID WC   insulin glargine-yfgn  5 Units Subcutaneous QHS   latanoprost  1 drop Both Eyes QHS   levothyroxine  25 mcg Oral QAC breakfast   mouth rinse  15 mL Mouth Rinse q12n4p   metoprolol succinate  12.5 mg Oral Daily   midodrine  10 mg Oral TID WC   multivitamin  1 tablet Oral QHS   pantoprazole  40 mg Oral Q0600   pregabalin  25 mg Oral BID   saccharomyces boulardii  250 mg Oral BID   Continuous Infusions:  [START ON 02/26/2021] sodium chloride     [START ON 02/26/2021] sodium chloride     ceFEPime (MAXIPIME) IV 1 g (02/23/21 1719)   lactated ringers Stopped (02/21/21 1532)   PRN Meds: [START ON 02/26/2021] sodium chloride, [START ON 02/26/2021] sodium chloride, acetaminophen **OR** acetaminophen, [START ON 02/26/2021] alteplase, clonazepam, docusate sodium, [START ON 02/26/2021] heparin, [START ON 02/26/2021] heparin, HYDROmorphone, [START ON 02/26/2021] lidocaine (PF), [START ON 02/26/2021] lidocaine-prilocaine, liver oil-zinc oxide, nitroGLYCERIN, ondansetron **OR** ondansetron (ZOFRAN) IV, [START ON  02/26/2021] pentafluoroprop-tetrafluoroeth, polyethylene glycol   Vital Signs    Vitals:   03/04/2021 0940 02/13/2021 0945 03/02/2021 0950 02/12/2021 1000  BP: (!) 87/15  92/60 104/63  Pulse: (!) 107 (!) 105 (!) 106 (!) 107  Resp: '17 20 20 '$ (!) 23  Temp:      TempSrc:      SpO2: 99% 99% 99% 100%  Weight:      Height:        Intake/Output Summary (Last 24 hours) at 03/09/2021 1054 Last data filed at 02/10/2021 0500 Gross per 24 hour  Intake 120 ml  Output 0 ml  Net 120 ml   Last 3 Weights 02/21/2021 03/06/2021 02/24/2021  Weight (lbs) 235 lb 0.2 oz 232 lb 5.8 oz 234 lb 1.6 oz  Weight (kg) 106.6 kg 105.4 kg 106.187 kg      Telemetry    Atrial fibrillation with HR 100-110s - Personally Reviewed  ECG     Atrial fibrillation- Personally Reviewed  Physical Exam   GEN: No acute distress.  Receiving dialysis Neck: No JVD Cardiac: tachycardic, no murmurs, rubs, or gallops.  Respiratory: Clear to auscultation bilaterally. GI: Soft, nontender, non-distended  MS: No edema; No deformity. Neuro:  Nonfocal  Psych: Normal affect   Labs    High Sensitivity Troponin:  No results for input(s): TROPONINIHS in the last 720  hours.    Chemistry Recent Labs  Lab 02/21/21 1237 02/23/21 1051 02/20/2021 0745  NA 134* 135 135  K 3.7 3.6 3.4*  CL 102 100 99  CO2 18* 22 25  GLUCOSE 166* 214* 182*  BUN 56* 46* 31*  CREATININE 7.21* 5.75* 4.70*  CALCIUM 7.6* 7.7* 8.1*  ALBUMIN 1.7* 1.6* 1.6*  GFRNONAA 8* 10* 13*  ANIONGAP '14 13 11     '$ Hematology Recent Labs  Lab 02/22/21 0730 02/23/21 1052 02/26/2021 0745  WBC 12.6* 12.7* 10.6*  RBC 3.12* 2.87* 2.92*  HGB 8.9* 8.2* 8.3*  HCT 28.4* 26.9* 27.8*  MCV 91.0 93.7 95.2  MCH 28.5 28.6 28.4  MCHC 31.3 30.5 29.9*  RDW 19.0* 19.6* 19.9*  PLT 229 228 222    BNPNo results for input(s): BNP, PROBNP in the last 168 hours.   DDimer No results for input(s): DDIMER in the last 168 hours.   Radiology    CT CARDIAC MORPH/PULM VEIN W/CM&W/O CA  SCORE  Addendum Date: 02/24/2021   ADDENDUM REPORT: 02/24/2021 09:36 EXAM: OVER-READ INTERPRETATION  CT CHEST The following report is an over-read performed by radiologist Dr. Alvino Blood [facility] on 02/24/2021. This over-read does not include interpretation of cardiac or coronary anatomy or pathology. The coronary CTA interpretation by the cardiologist is attached. COMPARISON:  None. FINDINGS: Limited view of the lung parenchyma demonstrates bibasilar atelectasis amd trace effusions. Airways are normal. Limited view of the mediastinum demonstrates no adenopathy. Limited view of the upper abdomen demonstrates small amount of high-density material within the esophagus which would indicate oral contrast. Limited view of the skeleton and chest wall is unremarkable. IMPRESSION: 1. Moderate basilar atelectasis trace effusions. 2. New simple intraperitoneal free fluid. 3. High-density material within the esophagus suggest oral contrast. Recommend clinical correlation. These results will be called to the ordering clinician or representative by the Radiologist Assistant, and communication documented in the PACS or [employer]. Electronically Signed   By: Suzy Bouchard M.D.   On: 02/24/2021 09:36   Result Date: 02/24/2021 CLINICAL DATA:  Pre Cardioversion EXAM: Cardiac Gated CTA TECHNIQUE: The patient was scanned on a Siemens Force [id number] slice scanner. Gantry rotation speed was 250 msec with a temporal resolution of 66 msec. A prospective scan was triggered in the ascending thoracic aorta at 140 HU's Data sets were reconstructed with full mA between 35% and 75% of the R-R interval Images were reviewed using VRT, MIP and MPR modes. Double oblique images were used to measure the PV diameter and areas. The patient received 80 cc of contrast at 5 cc/sec CONTRAST:  Isovue 370 total 80 cc COMPARISON:  None FINDINGS: Calcium score not done previous CABG LIMA patent to LAD SVG patent to PDA SVG patent  to OM AICD wires noted in RA/RV Mild LAE Normal RA No PFO/ASD No pericardial effusion LAA large with Broccoli type morphology No LAA thrombus LUPV:  Ostium 14 mm LLPV:   Ostium 16.7 mm RUPV:  Ostium 16.3 mm RLPV:  Ostium 17.2 mm IMPRESSION: 1.  Mild LAE Normal RA No LAA thrombus 2.  Patent SVG;s to PDA/OM Patent LIMA to LAD 3.  No ASD/PFO 4.  Normal PV anatomy 5.  AICD wires noted in RA/RV 6.  Normal aortic root 3.2 cm Jenkins Rouge MD [facility] Electronically Signed: By: Jenkins Rouge M.D. On: 02/23/2021 18:53    Cardiac Studies   Per Care Everywhere: Coronary angiography 04/30/2018: PROCEDURES PERFORMED:  --  Left heart catheterization with ventriculography. --  Left coronary angiography. --  Saphenous vein graft angiography. --  LIMA graft angiography. --  Root aortography. --  US Guidance Vascular Access. --  Pressure Wire procedure. --  Intervention on OM1: percutaneous intervention, myocardial flow reserve.  INDICATIONS: Angina/MI: unstable angina, CCS class IV. Abnormal stress test.  SUMMARY:  --  CORONARY CIRCULATION: --  Proximal LAD: There was a diffuse 80 % stenosis. The lesion was heavily calcified. --  Mid circumflex: There was a 75 % stenosis. --  1st obtuse marginal: The vessel was small to medium sized. Angiography showed moderate atherosclerosis. There was a diffuse 70 % stenosis. There was TIMI grade 3 flow through the vessel (brisk flow). --  Proximal RCA: There was a 100 % stenosis. --  Graft to the mid LAD: The graft was a patent LIMA. --  Graft to the 3rd obtuse marginal: The graft was a patent saphenous vein graft. --  Graft to the RPDA: The graft was a patent saphenous vein graft.  --  1ST LESION INTERVENTIONS: --  A percutaneous intervention with myocardial flow reserve measurement was performed on the lesion in the 1st obtuse marginal.  IMPRESSIONS: Stable 3 vessel CAD Absent LN LAD ca++ difuse 80% Cx om1 small 60-70% --> neg DFR RCA CTO  Patent 3 of  3 grafts LIMA to LAD SVG to RCA -PDA SVG to distal Cx-PL  For MOD SED see prelim post op note  CORONARY CIRCULATION: The coronary circulation is right dominant. 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 Proximal LAD: There was a diffuse 80 % stenosis. The lesion was heavily calcified. Mid circumflex: The vessel was small to medium sized. There was a 75 % stenosis. 1st obtuse marginal: The vessel was small to medium sized. Angiography showed moderate atherosclerosis. There was a diffuse 70 % stenosis. There was TIMI grade 3 flow through the vessel (brisk flow). Proximal RCA: There was a 100 % stenosis. Right PDA: The vessel was small sized. Graft to the mid LAD: The graft was a patent LIMA. Graft to the 3rd obtuse marginal: The graft was a patent saphenous vein graft. Graft to the RPDA: The graft was a patent saphenous vein graft.  PROCEDURE: The risks and alternatives of the procedures and conscious sedation were explained to the patient and informed consent was obtained. The patient was brought to the cath lab and placed on the table. The planned puncture sites were prepped and draped in the usual sterile fashion.  --  Right femoral artery access. The puncture site was infiltrated with 1 % lidocaine. The vessel was accessed using the modified Seldinger technique, a wire was threaded into the vessel, and a sheath was advanced over the wire into the vessel.  --  Left heart catheterization. A catheter was advanced to the ascending aorta. After recording ascending aortic pressure, the catheter was advanced across the aortic valve and left ventricular pressure was recorded. Ventriculography was performed using power injection of contrast agent. Imaging was performed using an RAO projection. Post-ventriculography LV pressure was obtained. The catheter was gradually withdrawn into the aorta under continuous pressure  monitoring and aortic pressure was recorded.  --  Left coronary artery angiography. A catheter was advanced to the aorta and positioned in the vessel ostium under fluoroscopic guidance.  --  Saphenous vein graft angiography. A catheter was advanced to the aorta and positioned at the aortic anastomosis of the graft under fluoroscopic guidance.  --  Left internal mammary graft angiography.  A catheter was advanced to the subclavian artery and positioned at the vessel origin under fluoroscopic guidance.  --  Root aortography. A catheter was positioned and contrast was injected.  --  US Guidance Vascular Access.  --  Pressure Wire procedure.  LESION INTERVENTION: A percutaneous intervention with myocardial flow reserve measurement was performed on the lesion in the 1st obtuse marginal. There was TIMI 3 flow before the procedure and TIMI 3 flow after the procedure. There was no acute vessel closure. There was no perforation. There was no dissection.  --  Sheath exchange. The sheath was exchanged for a 61F 23cm.  --  Vessel setup was performed. A 61F [id number] guiding catheter was used to cannulate the vessel.  --  Myocardial Fractional Flow Reserve (FFR) measurement was performed using a 0.014" pressure-monitoring Comet Pressure Wire guide-wire. Steady baseline values were obtained. Mean arterial pressure and mean distal coronary pressures were then obtained at maximum hyperemia.  COMPLICATIONS: No complications occurred during the cath lab visit.     Echo 02/21/21  1. Left ventricular ejection fraction, by estimation, is 30 to 35%. The  left ventricle has moderately decreased function. The left ventricle  demonstrates global hypokinesis. There is mild left ventricular  hypertrophy. Left ventricular diastolic  parameters are indeterminate.   2. Right ventricular systolic function was not well visualized. The right  ventricular size is normal There is a small (< 1 cm) echodensity on  the RV  device lead best seen on image 16.   3. The mitral valve is abnormal. Trivial mitral valve regurgitation. No  evidence of mitral stenosis. Moderate mitral annular calcification.   4. The aortic valve is tricuspid. Aortic valve regurgitation is not  visualized. Mild to moderate aortic valve sclerosis/calcification is  present, without any evidence of aortic stenosis.   5. The inferior vena cava is dilated in size with <50% respiratory  variability, suggesting right atrial pressure of 15 mmHg.   Comparison(s): No prior Echocardiogram.     CT cardiac morph 02/23/21 FINDINGS: Calcium score not done previous CABG   LIMA patent to LAD  SVG patent to PDA  SVG patent to OM    AICD wires noted in RA/RV   Mild LAE Normal RA No PFO/ASD No pericardial effusion  LAA large with Broccoli type morphology No LAA thrombus   LUPV:  Ostium 14 mm  LLPV:   Ostium 16.7 mm  RUPV:  Ostium 16.3 mm  RLPV:  Ostium 17.2 mm   IMPRESSION: 1.  Mild LAE Normal RA No LAA thrombus  2.  Patent SVG;s to PDA/OM Patent LIMA to LAD  3.  No ASD/PFO  4.  Normal PV anatomy  5.  AICD wires noted in RA/RV  6.  Normal aortic root 3.2 cm    Patient Profile     65 y.o. male with PMH of ESRD on HD, anemia of chronic diasease, DM II, PAF/flutter, COPD, OSA on BiPAP, chronic hypoxic respiratory failure on home O2 recently discharged in July after treated for R thigh abscess and underwent I&D on 7/14. He returned due to recurrent R thigh abscess and underwent repeat I&D on 8/11. ID recommended continue abx until 8/25. Cardiology following afib with RVR  Assessment & Plan    PAF/flutter with RVR  - h/o CTI and ablation at [facility] 06/2017  - noted to have SVT with aberrancy and started on amio and BB in 04/2018  - outpatient follow up has been poor, lots of no shows  -  high risk for TEE, CT showed no evidence of thrombus. Plan for DCCV (originally scheduled for 8/18, but patient ate)  - if has recurrence of  afib afterward, will involve EP   Chronic systolic heart failure s/p dual chamber ICD  - Echo 02/21/2021 showed EF 30-35%, similar to [facility] at [facility] 09/17/2020  - managed by dialysis.   CAD s/p CABG x3 2016 (LIMA-LAD, SVG-PDA, SVG-LCx)  Chronic hypotension on midodrine  ESRD on HD  Anemia  Recurrent R thigh abscess: underwent repeat I&D 8/11      For questions or updates, please contact [place] Please consult [email or web address] for contact info under        Signed, Almyra Deforest, [place]  03/06/2021, 10:54 AM

## 2021-02-25 NOTE — Plan of Care (Signed)
  Problem: Education: Goal: Knowledge of General Education information will improve Description: Including pain rating scale, medication(s)/side effects and non-pharmacologic comfort measures Outcome: Not Progressing   Problem: Health Behavior/Discharge Planning: Goal: Ability to manage health-related needs will improve Outcome: Not Progressing   

## 2021-02-25 NOTE — Procedures (Signed)
Patient seen and examined on Hemodialysis. BP (!) 88/52   Pulse (!) 103   Temp 97.7 F (36.5 C) (Oral)   Resp 20   Ht '5\' 10"'$  (1.778 m)   Wt 106.6 kg   SpO2 98%   BMI 33.72 kg/m   QB 400 mL/ min, UF goal 2L- will likely not get this as he is hypotensive, requiring midodrine and albumin support  Tolerating treatment without complaints at this time.   Madelon Lips MD [place] Pgr [phone] 11:24 AM

## 2021-02-25 NOTE — Transfer of Care (Signed)
Immediate Anesthesia Transfer of Care Note  Patient: Kyle Walton  Procedure(s) Performed: CARDIOVERSION  Patient Location: Endoscopy Unit  Anesthesia Type:General  Level of Consciousness: drowsy, patient cooperative and responds to stimulation  Airway & Oxygen Therapy: Patient Spontanous Breathing  Post-op Assessment: Report given to RN, Post -op Vital signs reviewed and stable and Patient moving all extremities  Post vital signs: Reviewed and stable  Last Vitals:  Vitals Value Taken Time  BP 93/49   Temp    Pulse 80   Resp 12   SpO2 99     Last Pain:  Vitals:   02/10/2021 1355  TempSrc:   PainSc: 0-No pain      Patients Stated Pain Goal: 0 ([id number])  Complications: No notable events documented.

## 2021-02-25 NOTE — Progress Notes (Signed)
[facility] Progress Note   Subjective:     For cardioversion today.  Objective Vitals:   02/12/2021 0950 02/13/2021 1000 02/21/2021 1030 02/20/2021 1100  BP: 92/60 104/63 97/61 (!) 88/52  Pulse: (!) 106 (!) 107 (!) 106 (!) 103  Resp: 20 (!) 23 (!) 23 20  Temp:      TempSrc:      SpO2: 99% 100% 100% 98%  Weight:      Height:       Physical Exam General: Chronically ill-appearing male; NAD Heart: Tachy, regular No murmurs, rubs, and gallops Lungs: Cleart throughout; No wheezing, rales, or rhonchi Abdomen: No ABD edema, soft, non-tender Extremities:  Trace LE edema. Wound vac to RLE in place-no drainage noted. Dialysis Access: L [facility]   Filed Weights   02/24/21 0554 02/11/2021 0500 02/08/2021 0746  Weight: 106.2 kg 105.4 kg 106.6 kg    Intake/Output Summary (Last 24 hours) at 03/05/2021 1123 Last data filed at 02/09/2021 0500 Gross per 24 hour  Intake 120 ml  Output 0 ml  Net 120 ml    Additional Objective Labs: Basic Metabolic Panel: Recent Labs  Lab 02/21/21 1237 02/23/21 1051 03/07/2021 0745  NA 134* 135 135  K 3.7 3.6 3.4*  CL 102 100 99  CO2 18* 22 25  GLUCOSE 166* 214* 182*  BUN 56* 46* 31*  CREATININE 7.21* 5.75* 4.70*  CALCIUM 7.6* 7.7* 8.1*  PHOS 6.5* 5.6* 4.2   Liver Function Tests: Recent Labs  Lab 02/21/21 1237 02/23/21 1051 02/23/2021 0745  ALBUMIN 1.7* 1.6* 1.6*   No results for input(s): LIPASE, AMYLASE in the last 168 hours. CBC: Recent Labs  Lab 02/20/21 0104 02/21/21 1237 02/22/21 0730 02/23/21 1052 03/03/2021 0745  WBC 10.7* 13.8* 12.6* 12.7* 10.6*  HGB 7.9* 7.4* 8.9* 8.2* 8.3*  HCT 25.6* 24.9* 28.4* 26.9* 27.8*  MCV 93.1 93.3 91.0 93.7 95.2  PLT 186 211 229 228 222   Blood Culture    Component Value Date/Time   SDES BLOOD RIGHT ANTECUBITAL 02/24/2021 1110   SPECREQUEST  02/22/2021 1110    BOTTLES DRAWN AEROBIC ONLY Blood Culture adequate volume   CULT  02/16/2021 1110    NO GROWTH 5 DAYS Performed at [facility], [place] [address] [place], [place] [address]    REPTSTATUS 02/12/2021 FINAL 03/04/2021 1110    Cardiac Enzymes: No results for input(s): CKTOTAL, CKMB, CKMBINDEX, TROPONINI in the last 168 hours. CBG: Recent Labs  Lab 02/23/21 2006 02/24/21 0832 02/24/21 1158 02/24/21 1550 02/24/21 2116  GLUCAP 148* 177* 194* 178* 146*   Iron Studies: No results for input(s): IRON, TIBC, TRANSFERRIN, FERRITIN in the last 72 hours. Lab Results  Component Value Date   INR 1.9 (H) 02/21/2021   INR 2.6 (H) 01/18/2021   INR 1.5 (H) 12/04/2020   Studies/Results: CT CARDIAC MORPH/PULM VEIN W/CM&W/O CA SCORE  Addendum Date: 02/24/2021   ADDENDUM REPORT: 02/24/2021 09:36 EXAM: OVER-READ INTERPRETATION  CT CHEST The following report is an over-read performed by radiologist Dr. Alvino Blood [facility] on 02/24/2021. This over-read does not include interpretation of cardiac or coronary anatomy or pathology. The coronary CTA interpretation by the cardiologist is attached. COMPARISON:  None. FINDINGS: Limited view of the lung parenchyma demonstrates bibasilar atelectasis amd trace effusions. Airways are normal. Limited view of the mediastinum demonstrates no adenopathy. Limited view of the upper abdomen demonstrates small amount of high-density material within the esophagus which would indicate oral contrast. Limited view of the skeleton  and chest wall is unremarkable. IMPRESSION: 1. Moderate basilar atelectasis trace effusions. 2. New simple intraperitoneal free fluid. 3. High-density material within the esophagus suggest oral contrast. Recommend clinical correlation. These results will be called to the ordering clinician or representative by the Radiologist Assistant, and communication documented in the PACS or [employer]. Electronically Signed   By: Suzy Bouchard M.D.   On: 02/24/2021 09:36   Result Date: 02/24/2021 CLINICAL DATA:  Pre Cardioversion EXAM: Cardiac Gated CTA  TECHNIQUE: The patient was scanned on a Siemens Force [id number] slice scanner. Gantry rotation speed was 250 msec with a temporal resolution of 66 msec. A prospective scan was triggered in the ascending thoracic aorta at 140 HU's Data sets were reconstructed with full mA between 35% and 75% of the R-R interval Images were reviewed using VRT, MIP and MPR modes. Double oblique images were used to measure the PV diameter and areas. The patient received 80 cc of contrast at 5 cc/sec CONTRAST:  Isovue 370 total 80 cc COMPARISON:  None FINDINGS: Calcium score not done previous CABG LIMA patent to LAD SVG patent to PDA SVG patent to OM AICD wires noted in RA/RV Mild LAE Normal RA No PFO/ASD No pericardial effusion LAA large with Broccoli type morphology No LAA thrombus LUPV:  Ostium 14 mm LLPV:   Ostium 16.7 mm RUPV:  Ostium 16.3 mm RLPV:  Ostium 17.2 mm IMPRESSION: 1.  Mild LAE Normal RA No LAA thrombus 2.  Patent SVG;s to PDA/OM Patent LIMA to LAD 3.  No ASD/PFO 4.  Normal PV anatomy 5.  AICD wires noted in RA/RV 6.  Normal aortic root 3.2 cm Jenkins Rouge MD [facility] Electronically Signed: By: Jenkins Rouge M.D. On: 02/23/2021 18:53    Medications:  [START ON 02/26/2021] sodium chloride     [START ON 02/26/2021] sodium chloride     ceFEPime (MAXIPIME) IV 1 g (02/23/21 1719)   lactated ringers Stopped (02/21/21 1532)    (feeding supplement) PROSource Plus  30 mL Oral BID BM   amiodarone  200 mg Oral BID   apixaban  5 mg Oral BID   atorvastatin  40 mg Oral QPM   chlorhexidine  15 mL Mouth Rinse BID   Chlorhexidine Gluconate Cloth  6 each Topical Q0600   cholestyramine light  4 g Oral BID   clopidogrel  75 mg Oral Daily   darbepoetin (ARANESP) injection - DIALYSIS  200 mcg Intravenous Q Wed-HD   docusate sodium  100 mg Oral BID   feeding supplement (NEPRO CARB STEADY)  237 mL Oral Q24H   fentaNYL  1 patch Transdermal Q72H   ferric citrate  420 mg Oral BID WC   Gerhardt's butt cream   Topical TID   insulin aspart   0-5 Units Subcutaneous QHS   insulin aspart  0-9 Units Subcutaneous TID WC   insulin glargine-yfgn  5 Units Subcutaneous QHS   latanoprost  1 drop Both Eyes QHS   levothyroxine  25 mcg Oral QAC breakfast   mouth rinse  15 mL Mouth Rinse q12n4p   metoprolol succinate  12.5 mg Oral Daily   midodrine  10 mg Oral TID WC   multivitamin  1 tablet Oral QHS   pantoprazole  40 mg Oral Q0600   pregabalin  25 mg Oral BID   saccharomyces boulardii  250 mg Oral BID    Dialysis Orders: [facility]: MWF 4 hrs F250 450/800 3.0K/2.5 Ca LIJ TDC  EDW 111 kg -Heparin 4900 units IV initial  bolus, 500 units hourly, DC last hour of tx  Assessment/Plan: R. Thigh Abscess: s/p I&D on 8/11. Wound Vac off. Per primary. Continue cefepime with HD until 03/03/21  ESRD - On HD MWF.  Next HD 8/19 Hypotension/volume-Have attempted to challenge and lower EDW since admit. Using Midodrine with extra 10 mg PO mid run to offset intradialyic hypotension. Continue lowering volume as tolerated. Not tolerating much UF. Maybe will improve after cardioversion?   AFIB: On amiodarone, apixaban and low-dose metoprolol. Cardiology following -plans to go ahead with cardioversion, Ct didn't show evidence of thrombus, occurring today. Anemia  - Hgb 8.2 Transfused 1 U prbc on 8/15.  Aranesp increased to 200 q Wed starting 8/17.  Metabolic bone disease - Corr Ca/Phos ok. Continue Auryxia 2 tab PO BID  Nutrition - Renal/Carb mod diet with protein supps, renal vits. CM EF 30-35% ICD in place  DMT2-per primary Hypothyroidism-per primary Disposition: Pending    Madelon Lips MD [facility] Pgr [phone] 02/14/2021,11:23 AM

## 2021-02-25 NOTE — Interval H&P Note (Signed)
History and Physical Interval Note:  02/24/2021 2:06 PM  Kyle Walton  has presented today for surgery, with the diagnosis of AFIB.  The various methods of treatment have been discussed with the patient and family. After consideration of risks, benefits and other options for treatment, the patient has consented to  Procedure(s): CARDIOVERSION (N/A) as a surgical intervention.  The patient's history has been reviewed, patient examined, no change in status, stable for surgery.  I have reviewed the patient's chart and labs.  Questions were answered to the patient's satisfaction.     Skeet Latch, MD

## 2021-02-25 NOTE — CV Procedure (Signed)
Electrical Cardioversion Procedure Note Adaiah Searl [id number] 04-26-1956  Procedure: Electrical Cardioversion Indications:  Atrial Fibrillation  Procedure Details Consent: Risks of procedure as well as the alternatives and risks of each were explained to the (patient/caregiver).  Consent for procedure obtained. Time Out: Verified patient identification, verified procedure, site/side was marked, verified correct patient position, special equipment/implants available, medications/allergies/relevent history reviewed, required imaging and test results available.  Performed  Patient placed on cardiac monitor, pulse oximetry, supplemental oxygen as necessary.  Sedation given:  propofol Pacer pads placed anterior and posterior chest.  Cardioverted 1 time(s).  Cardioverted at [place].  Evaluation Findings: Post procedure EKG shows:  ASVP Complications: None Patient did tolerate procedure well.   Skeet Latch, MD 02/26/2021, 2:30 PM

## 2021-02-26 LAB — GLUCOSE, CAPILLARY
Glucose-Capillary: 164 mg/dL — ABNORMAL HIGH (ref 70–99)
Glucose-Capillary: 157 mg/dL — ABNORMAL HIGH (ref 70–99)
Glucose-Capillary: 121 mg/dL — ABNORMAL HIGH (ref 70–99)
Glucose-Capillary: 137 mg/dL — ABNORMAL HIGH (ref 70–99)

## 2021-02-26 NOTE — TOC Progression Note (Signed)
Transition of Care [facility]) - Progression Note    Patient Details  Name: Kyle Walton MRN: [id number] Date of Birth: 1956/05/10  Transition of Care [facility]) CM/SW [place], [place] Phone Number: 02/26/2021, 9:30 AM  Clinical Narrative:    CSW left message for [facility] to review patient as Medicaid pending. CSW also requested [facility] review patient, though if they accept him would likely need to change dialysis centers to [place].    Expected Discharge Plan: Skilled Nursing Facility Barriers to Discharge: Ship broker, Continued Medical Work up  Expected Discharge Plan and Services Expected Discharge Plan: [place] In-house Referral: Clinical Social Work   Post Acute Care Choice: [place] Living arrangements for the past 2 months: [place]                                       Social Determinants of Health (SDOH) Interventions    Readmission Risk Interventions Readmission Risk Prevention Plan 02/16/2021 01/28/2021  Transportation Screening Complete Complete  Medication Review (RN Care Manager) Referral to Pharmacy Referral to Pharmacy  PCP or Specialist appointment within 3-5 days of discharge Complete Complete  HRI or Home Care Consult Complete Complete  SW Recovery Care/Counseling Consult Complete Complete  Palliative Care Screening Not Applicable Not Applicable  Skilled Nursing Facility Complete Complete

## 2021-02-26 NOTE — Progress Notes (Signed)
PROGRESS NOTE    Kyle Walton  [id number] DOB: 1956/06/20 DOA: 03/06/2021 PCP: Patient, No Pcp Per (Inactive)   Brief Narrative: 65 chronically ill with ESRD, anemia of chronic disease, type 2 diabetes, chronic diastolic heart failure, paroxysmal A. fib on Eliquis, COPD chronic hypoxic respiratory failure on 2 L of oxygen, nocturnal BiPAP, hypothyroidism recently discharged from [facility] 7/26-after he was treated for right thigh abscess-he had undergone I&D on 7/14.  He was discharged to SNF on oral antimicrobial therapy-while at SNF-he was noted to have recurrence of his right thigh abscess-hence he was admitted to the hospitalist service.  He was started on broad-spectrum antimicrobial therapy-and underwent repeat I&D on 8/11.  He was evaluated by infectious disease with recommendations to continue with cefepime for 2 weeks with HD until 8/25.  See below for further details.  Subjective: Seen earlier this morning-lying comfortably in bed.  Per nursing staff no major issues overnight.  Patient denies any chest pain or shortness of breath.  He is maintaining sinus rhythm.  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  Per Ortho-sutures need to be maintained until 3 weeks postoperative (around 9/1).  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  He remains in normal sinus rhythm.  Continue amiodarone/metoprolol and Eliquis.  Await further recommendations from cardiology.  Note-cardiac CT was negative for LV thrombus.    Chronic systolic heart failure (EF 30-35% by echo on 8/15)-s/p ICD placement: Volume status stable-diuresis with HD-has  chronic hypotension-and can only tolerate low-dose metoprolol.  Chronic hypotension: Continue midodrine-he is usually asymptomatic.  ESRD on HD TTS: Nephrology following and directing care.  Anemia: Due to ESRD-worsened by acute illness-continue to follow CBC.  Has required PRBC transfusion.  IV iron/Aranesp defer to nephrology.   History of CAD s/p CABG: Continue Plavix/beta-blocker/statin.  Chronic hypoxic respiratory failure/OSA: Continue BiPAP nightly.  Hypothyroidism: Continue levothyroxine  DM-2 (A1c 7.2 on 8/1): CBG stable-continue Lantus 5 units daily/SSI.    Recent Labs    02/16/2021 1626 02/11/2021 2117 02/26/21 0750  GLUCAP 126* 153* 164*     Legally blind  History of PAD-s/p right transmetatarsal amputation, and s/p amputation of several toes on the left foot.  Obesity: Estimated body mass index is 33.75 kg/m as calculated from the following:   Height as of this encounter: '5\' 10"'$  (1.778 m).   Weight as of this encounter: 106.7 kg.   DVT prophylaxis: Eliquis Code Status: Full Code Family Communication: None at bedside-we will discuss with family over the next few days. Disposition Plan:  Status is: Inpatient  Remains inpatient appropriate because:IV treatments appropriate due to intensity of illness or inability to take PO  Dispo: The patient is from: SNF              Anticipated d/c is to: SNF (insurance refused LTAC)              Patient currently is not medically stable to d/c.   Difficult to place patient No   Consultants:  Ortho Nephrology Cardiology  Procedures:  I and D on 8/11 [facility] IV cardioversion 8/19  Antimicrobials:     Objective: Vitals:   02/26/21 0500 02/26/21 0742 02/26/21 0835 02/26/21 0900  BP: (!) 87/63 (!) 91/45 Marland Kitchen)  94/53 (!) 92/59  Pulse: 69 70 69 (!) 114  Resp: (!) '21 18  20  '$ Temp: 98.7 F (37.1 C) 97.6 F (36.4 C)  97.7 F (36.5 C)  TempSrc: Axillary Oral  Oral  SpO2: 99% 98%  99%  Weight:      Height:         Intake/Output Summary (Last 24 hours) at 02/26/2021 1052 Last data filed at 02/26/2021 0524 Gross per 24 hour  Intake 240 ml  Output 0 ml  Net 240 ml    Filed Weights   03/09/2021 0746 02/26/21 0428 02/26/21 0449  Weight: 106.6 kg 106.7 kg 106.7 kg    Examination: Gen Exam:Alert awake-not in any distress HEENT:atraumatic, normocephalic Chest: B/L clear to auscultation anteriorly CVS:S1S2 regular Abdomen:soft non tender, non distended Extremities:no edema.  S/p right transmetatarsal amputation-s/p numerous toes amputation on the left Neurology: Non focal Skin: no rash    Data Reviewed: I have personally reviewed following labs and imaging studies  CBC: Recent Labs  Lab 02/20/21 0104 02/21/21 1237 02/22/21 0730 02/23/21 1052 03/08/2021 0745  WBC 10.7* 13.8* 12.6* 12.7* 10.6*  HGB 7.9* 7.4* 8.9* 8.2* 8.3*  HCT 25.6* 24.9* 28.4* 26.9* 27.8*  MCV 93.1 93.3 91.0 93.7 95.2  PLT 186 211 229 228 [id number]    Basic Metabolic Panel: Recent Labs  Lab 02/21/21 1237 02/22/21 0730 02/23/21 1051 03/06/2021 0745  NA 134*  --  135 135  K 3.7  --  3.6 3.4*  CL 102  --  100 99  CO2 18*  --  22 25  GLUCOSE 166*  --  214* 182*  BUN 56*  --  46* 31*  CREATININE 7.21*  --  5.75* 4.70*  CALCIUM 7.6*  --  7.7* 8.1*  MG  --  1.6*  --   --   PHOS 6.5*  --  5.6* 4.2    GFR: Estimated Creatinine Clearance: 19.2 mL/min (A) (by C-G formula based on SCr of 4.7 mg/dL (H)). Liver Function Tests: Recent Labs  Lab 02/21/21 1237 02/23/21 1051 02/18/2021 0745  ALBUMIN 1.7* 1.6* 1.6*    No results for input(s): LIPASE, AMYLASE in the last 168 hours. No results for input(s): AMMONIA in the last 168 hours. Coagulation Profile: No results for input(s): INR, PROTIME in the last 168 hours. Cardiac Enzymes: No results for input(s): CKTOTAL, CKMB, CKMBINDEX, TROPONINI in the last 168 hours. BNP (last 3 results) No results for input(s): PROBNP in the last 8760 hours. HbA1C: No results for  input(s): HGBA1C in the last 72 hours. CBG: Recent Labs  Lab 02/24/21 2116 02/22/2021 1240 02/09/2021 1626 02/24/2021 2117 02/26/21 0750  GLUCAP 146* 112* 126* 153* 164*    Lipid Profile: No results for input(s): CHOL, HDL, LDLCALC, TRIG, CHOLHDL, LDLDIRECT in the last 72 hours. Thyroid Function Tests: No results for input(s): TSH, T4TOTAL, FREET4, T3FREE, THYROIDAB in the last 72 hours. Anemia Panel: No results for input(s): VITAMINB12, FOLATE, FERRITIN, TIBC, IRON, RETICCTPCT in the last 72 hours. Sepsis Labs: Recent Labs  Lab 02/21/21 1237  LATICACIDVEN 1.1     Recent Results (from the past 240 hour(s))  Surgical PCR screen     Status: Abnormal   Collection Time: 02/16/21 11:19 PM   Specimen: Nasal Mucosa; Nasal Swab  Result Value Ref Range Status   MRSA, PCR NEGATIVE NEGATIVE Final   Staphylococcus aureus POSITIVE (A) NEGATIVE Final    Comment: (NOTE) The Xpert SA Assay (FDA approved for NASAL specimens in patients 22 years of  age and older), is one component of a comprehensive surveillance program. It is not intended to diagnose infection nor to guide or monitor treatment. Performed at [facility], [place] [address], [place], [place] [address]           Radiology Studies: No results found.      Scheduled Meds:  (feeding supplement) PROSource Plus  30 mL Oral BID BM   amiodarone  200 mg Oral BID   apixaban  5 mg Oral BID   atorvastatin  40 mg Oral QPM   chlorhexidine  15 mL Mouth Rinse BID   Chlorhexidine Gluconate Cloth  6 each Topical Q0600   cholestyramine light  4 g Oral BID   clopidogrel  75 mg Oral Daily   darbepoetin (ARANESP) injection - DIALYSIS  200 mcg Intravenous Q Wed-HD   docusate sodium  100 mg Oral BID   feeding supplement (NEPRO CARB STEADY)  237 mL Oral Q24H   fentaNYL  1 patch Transdermal Q72H   ferric citrate  420 mg Oral BID WC   Gerhardt's butt cream   Topical TID   insulin aspart  0-5 Units Subcutaneous QHS   insulin  aspart  0-9 Units Subcutaneous TID WC   insulin glargine-yfgn  5 Units Subcutaneous QHS   latanoprost  1 drop Both Eyes QHS   levothyroxine  25 mcg Oral QAC breakfast   mouth rinse  15 mL Mouth Rinse q12n4p   metoprolol succinate  12.5 mg Oral Daily   midodrine  10 mg Oral TID WC   multivitamin  1 tablet Oral QHS   pantoprazole  40 mg Oral Q0600   pregabalin  25 mg Oral BID   saccharomyces boulardii  250 mg Oral BID   Continuous Infusions:  ceFEPime (MAXIPIME) IV 1 g (02/19/2021 1800)   lactated ringers Stopped (02/21/21 1532)     LOS: 19 days    Time spent: 35  minutes.     Oren Binet, MD [facility]   If 7PM-7AM, please contact night-coverage [email or web address]  02/26/2021, 10:52 AM

## 2021-02-26 NOTE — Progress Notes (Addendum)
I have personally seen and examined this patient and agree with the assessment/plan as outlined below.   Kyle Walton 02/26/2021 11:38 AM    [facility] Progress Note   Subjective: Seen in room, sleeping just got pain meds. Awakened, says he is fine, then fell asleep again. RN notified of rhythm change.     Objective Vitals:   02/26/21 0500 02/26/21 0742 02/26/21 0835 02/26/21 0900  BP: (!) 87/63 (!) 91/45 (!) 94/53 (!) 92/59  Pulse: 69 70 69 (!) 114  Resp: (!) '21 18  20  '$ Temp: 98.7 F (37.1 C) 97.6 F (36.4 C)  97.7 F (36.5 C)  TempSrc: Axillary Oral  Oral  SpO2: 99% 98%  99%  Weight:      Height:       Physical Exam General: Chronically ill appearing male in NAD Heart: S1,S2 RRR No M/R/G Now is wide complex tachycardia rate 111.  Lungs: CTAB Abdomen: No abdominal edema. NABS.  Extremities: No LE edema. Drsg R thigh CDI, [facility] dc'd.  Dialysis Access: [facility] Drsg intact   Additional Objective Labs: Basic Metabolic Panel: Recent Labs  Lab 02/21/21 1237 02/23/21 1051 03/03/2021 0745  NA 134* 135 135  K 3.7 3.6 3.4*  CL 102 100 99  CO2 18* 22 25  GLUCOSE 166* 214* 182*  BUN 56* 46* 31*  CREATININE 7.21* 5.75* 4.70*  CALCIUM 7.6* 7.7* 8.1*  PHOS 6.5* 5.6* 4.2   Liver Function Tests: Recent Labs  Lab 02/21/21 1237 02/23/21 1051 02/23/2021 0745  ALBUMIN 1.7* 1.6* 1.6*   No results for input(s): LIPASE, AMYLASE in the last 168 hours. CBC: Recent Labs  Lab 02/20/21 0104 02/21/21 1237 02/22/21 0730 02/23/21 1052 03/02/2021 0745  WBC 10.7* 13.8* 12.6* 12.7* 10.6*  HGB 7.9* 7.4* 8.9* 8.2* 8.3*  HCT 25.6* 24.9* 28.4* 26.9* 27.8*  MCV 93.1 93.3 91.0 93.7 95.2  PLT 186 211 229 228 222   Blood Culture    Component Value Date/Time   SDES BLOOD RIGHT ANTECUBITAL 02/19/2021 1110   SPECREQUEST  02/12/2021 1110    BOTTLES DRAWN AEROBIC ONLY Blood Culture adequate volume   CULT  02/11/2021 1110    NO GROWTH 5 DAYS Performed at [facility], [place] [address], [place], [place] [address]    REPTSTATUS 02/12/2021 FINAL 02/19/2021 1110    Cardiac Enzymes: No results for input(s): CKTOTAL, CKMB, CKMBINDEX, TROPONINI in the last 168 hours. CBG: Recent Labs  Lab 02/24/21 2116 02/10/2021 1240 02/14/2021 1626 03/08/2021 2117 02/26/21 0750  GLUCAP 146* 112* 126* 153* 164*   Iron Studies: No results for input(s): IRON, TIBC, TRANSFERRIN, FERRITIN in the last 72 hours. '@lablastinr3'$ @ Studies/Results: No results found. Medications:  ceFEPime (MAXIPIME) IV 1 g (02/13/2021 1800)   lactated ringers Stopped (02/21/21 1532)    (feeding supplement) PROSource Plus  30 mL Oral BID BM   amiodarone  200 mg Oral BID   apixaban  5 mg Oral BID   atorvastatin  40 mg Oral QPM   chlorhexidine  15 mL Mouth Rinse BID   Chlorhexidine Gluconate Cloth  6 each Topical Q0600   cholestyramine light  4 g Oral BID   clopidogrel  75 mg Oral Daily   darbepoetin (ARANESP) injection - DIALYSIS  200 mcg Intravenous Q Wed-HD   docusate sodium  100 mg Oral BID   feeding supplement (NEPRO CARB STEADY)  237 mL Oral Q24H   fentaNYL  1 patch Transdermal Q72H   ferric citrate  420 mg  Oral BID WC   Gerhardt's butt cream   Topical TID   insulin aspart  0-5 Units Subcutaneous QHS   insulin aspart  0-9 Units Subcutaneous TID WC   insulin glargine-yfgn  5 Units Subcutaneous QHS   latanoprost  1 drop Both Eyes QHS   levothyroxine  25 mcg Oral QAC breakfast   mouth rinse  15 mL Mouth Rinse q12n4p   metoprolol succinate  12.5 mg Oral Daily   midodrine  10 mg Oral TID WC   multivitamin  1 tablet Oral QHS   pantoprazole  40 mg Oral Q0600   pregabalin  25 mg Oral BID   saccharomyces boulardii  250 mg Oral BID     Dialysis Orders: [facility]: MWF 4 hrs F250 450/800 3.0K/2.5 Ca LIJ TDC  EDW 111 kg -Heparin 4900 units IV initial bolus, 500 units hourly, DC last hour of tx   Assessment/Plan: R. Thigh Abscess: s/p I&D on 8/11. Wound Vac off. Per primary. Continue  cefepime with HD until 03/03/21  ESRD - On HD MWF.  Next HD 02/28/2021 Hypotension/volume-Have attempted to challenge and lower EDW since admit. Using Midodrine with extra 10 mg PO mid run to offset intradialyic hypotension. Continue lowering volume as tolerated. Not tolerating much UF Minimal UF with HD 03/08/2021.   AFIB: On amiodarone, apixaban and low-dose metoprolol. Cardiology following -plans to go ahead with cardioversion, CT didn't show evidence of thrombus. Went for cardioversion, 02/26/21 ultimately was paced out of AVNRT into SR. However now back in wide complex tachycardia rate 111.  Anemia  - Hgb 8.2 Transfused 1 U prbc on 8/15.  Aranesp increased to 200 q Wed starting 8/17.  Metabolic bone disease - Corr Ca/Phos ok. Continue Auryxia 2 tab PO BID  Nutrition - Renal/Carb mod diet with protein supps, renal vits. CM EF 30-35% ICD in place  DMT2-per primary Hypothyroidism-per primary Disposition: Pending SNF placement.   Rita H. Brown NP-C 02/26/2021, 10:57 AM  [employer] [phone]

## 2021-02-27 DIAGNOSIS — I471 Supraventricular tachycardia: Secondary | ICD-10-CM

## 2021-02-27 LAB — CBC
HCT: 29.1 % — ABNORMAL LOW (ref 39.0–52.0)
HCT: 30.7 % — ABNORMAL LOW (ref 39.0–52.0)
Hemoglobin: 8.7 g/dL — ABNORMAL LOW (ref 13.0–17.0)
Hemoglobin: 9.2 g/dL — ABNORMAL LOW (ref 13.0–17.0)
MCH: 28.2 pg (ref 26.0–34.0)
MCH: 28.5 pg (ref 26.0–34.0)
MCHC: 29.9 g/dL — ABNORMAL LOW (ref 30.0–36.0)
MCHC: 30 g/dL (ref 30.0–36.0)
MCV: 94.2 fL (ref 80.0–100.0)
MCV: 95.4 fL (ref 80.0–100.0)
Platelets: 208 10*3/uL (ref 150–400)
Platelets: 216 10*3/uL (ref 150–400)
RBC: 3.05 MIL/uL — ABNORMAL LOW (ref 4.22–5.81)
RBC: 3.26 MIL/uL — ABNORMAL LOW (ref 4.22–5.81)
RDW: 20.3 % — ABNORMAL HIGH (ref 11.5–15.5)
RDW: 20.5 % — ABNORMAL HIGH (ref 11.5–15.5)
WBC: 10.7 10*3/uL — ABNORMAL HIGH (ref 4.0–10.5)
WBC: 12.8 10*3/uL — ABNORMAL HIGH (ref 4.0–10.5)
nRBC: 0.2 % (ref 0.0–0.2)
nRBC: 0.4 % — ABNORMAL HIGH (ref 0.0–0.2)

## 2021-02-27 LAB — RENAL FUNCTION PANEL
Albumin: 1.6 g/dL — ABNORMAL LOW (ref 3.5–5.0)
Anion gap: 8 (ref 5–15)
BUN: 24 mg/dL — ABNORMAL HIGH (ref 8–23)
CO2: 26 mmol/L (ref 22–32)
Calcium: 8 mg/dL — ABNORMAL LOW (ref 8.9–10.3)
Chloride: 100 mmol/L (ref 98–111)
Creatinine, Ser: 4.18 mg/dL — ABNORMAL HIGH (ref 0.61–1.24)
GFR, Estimated: 15 mL/min — ABNORMAL LOW (ref >60)
Glucose, Bld: 145 mg/dL — ABNORMAL HIGH (ref 70–99)
Phosphorus: 3.5 mg/dL (ref 2.5–4.6)
Potassium: 3.5 mmol/L (ref 3.5–5.1)
Sodium: 134 mmol/L — ABNORMAL LOW (ref 135–145)

## 2021-02-27 LAB — GLUCOSE, CAPILLARY
Glucose-Capillary: 156 mg/dL — ABNORMAL HIGH (ref 70–99)
Glucose-Capillary: 205 mg/dL — ABNORMAL HIGH (ref 70–99)
Glucose-Capillary: 240 mg/dL — ABNORMAL HIGH (ref 70–99)
Glucose-Capillary: 240 mg/dL — ABNORMAL HIGH (ref 70–99)

## 2021-02-27 MED ORDER — AMIODARONE HCL IN DEXTROSE 360-4.14 MG/200ML-% IV SOLN
30.0000 mg/h | INTRAVENOUS | Status: DC
  Administered 2021-03-01 (×2): 30 mg/h via INTRAVENOUS
  Filled 2021-02-27 (×6): qty 200

## 2021-02-27 MED ORDER — ALBUMIN HUMAN 25 % IV SOLN
50.0000 g | Freq: Once | INTRAVENOUS | Status: AC
  Administered 2021-02-27: 50 g via INTRAVENOUS
  Filled 2021-02-27: qty 200

## 2021-02-27 MED ORDER — AMIODARONE HCL IN DEXTROSE 360-4.14 MG/200ML-% IV SOLN
60.0000 mg/h | INTRAVENOUS | Status: AC
  Administered 2021-02-27: 60 mg/h via INTRAVENOUS
  Filled 2021-02-27: qty 200

## 2021-02-27 MED ORDER — SODIUM CHLORIDE 0.9 % IV BOLUS
250.0000 mL | Freq: Once | INTRAVENOUS | Status: AC
  Administered 2021-02-27: 250 mL via INTRAVENOUS

## 2021-02-27 MED ORDER — HYDROMORPHONE HCL 2 MG PO TABS: 1.0000 mg | ORAL_TABLET | Freq: Four times a day (QID) | ORAL | Status: DC | PRN

## 2021-02-27 MED ORDER — POLYETHYLENE GLYCOL 3350 17 G PO PACK
17.0000 g | PACK | Freq: Every day | ORAL | Status: DC
  Administered 2021-02-27 – 2021-03-01 (×3): 17 g via ORAL
  Filled 2021-02-27 (×2): qty 1

## 2021-02-27 NOTE — Progress Notes (Signed)
Kyle Walton  [id number] DOB: 03-03-1956 DOA: 02/11/2021 PCP: Patient, No Pcp Per (Inactive)   Brief Narrative: 19 chronically ill with ESRD, anemia of chronic disease, type 2 diabetes, chronic diastolic heart failure, paroxysmal A. fib on Eliquis, COPD chronic hypoxic respiratory failure on 2 L of oxygen, nocturnal BiPAP, hypothyroidism recently discharged from [facility] 7/26-after he was treated for right thigh abscess-he had undergone I&D on 7/14.  He was discharged to SNF on oral antimicrobial therapy-while at SNF-he was noted to have recurrence of his right thigh abscess-hence he was admitted to the hospitalist service.  He was started on broad-spectrum antimicrobial therapy-and underwent repeat I&D on 8/11.  He was evaluated by infectious disease with recommendations to continue with cefepime for 2 weeks with HD until 8/25.  See below for further details.  Subjective: Awake/alert-told me that he was sorry for being negative over the past few days.  Pain in his right thigh is well controlled.  Answering my questions appropriately.  Heart rate back in the 120s this morning.  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  Per Ortho-sutures need to be maintained until 3 weeks postoperative (around 9/1).  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  His heart rate is back in the 120s-130s this morning.  I have reached out to cardiology for further recommendations.  Note-cardiac CT was negative for LV thrombus.    Chronic systolic heart failure (EF 30-35% by echo on 8/15)-s/p ICD placement: Volume status stable-diuresis with  HD-has chronic hypotension-and can only tolerate low-dose metoprolol.  Chronic hypotension: Continue midodrine-he remains asymptomatic with this low blood pressure.  ESRD on HD TTS: Nephrology following and directing care.  Anemia: Due to ESRD-worsened by acute illness-continue to follow CBC.  Has required PRBC transfusion.  IV iron/Aranesp defer to nephrology.   History of CAD s/p CABG: Continue Plavix/beta-blocker/statin.  Chronic hypoxic respiratory failure/OSA: Continue BiPAP nightly.  Hypothyroidism: Continue levothyroxine  DM-2 (A1c 7.2 on 8/1): CBG stable-continue Lantus 5 units daily/SSI.    Recent Labs    02/26/21 1656 02/26/21 2051 02/27/21 0750  GLUCAP 121* 137* 156*     Legally blind  History of PAD-s/p right transmetatarsal amputation, and s/p amputation of several toes on the left foot.  Obesity: Estimated body mass index is 33.75 kg/m as calculated from the following:   Height as of this encounter: '5\' 10"'$  (1.778 m).   Weight as of this encounter: 106.7 kg.   DVT prophylaxis: Eliquis Code Status: Full Code Family Communication: None at bedside-we will discuss with family over the next few days. Disposition Plan:  Status is: Inpatient  Remains inpatient appropriate because:IV treatments appropriate due to intensity of illness or inability to take PO  Dispo: The patient is from: SNF              Anticipated d/c is to: SNF (insurance refused LTAC)              Patient currently is not medically stable to d/c.   Difficult to place patient No   Consultants:  Ortho Nephrology Cardiology  Procedures:  I and D on 8/11 [facility] IV cardioversion 8/19  Antimicrobials:     Objective: Vitals:   02/26/21  1942 02/27/21 0000 02/27/21 0747 02/27/21 1000  BP:  (!) 89/58 (!) 89/58 93/65  Pulse:  (!) 108 (!) 122 (!) 121  Resp:  '18 14 14  '$ Temp: 98 F (36.7 C) 98 F (36.7 C) 97.8 F (36.6 C)   TempSrc: Oral Axillary Oral   SpO2:  99% 99% 100%  Weight:       Height:        Intake/Output Summary (Last 24 hours) at 02/27/2021 1138 Last data filed at 02/27/2021 1100 Gross per 24 hour  Intake 240 ml  Output --  Net 240 ml    Filed Weights   02/10/2021 0746 02/26/21 0428 02/26/21 0449  Weight: 106.6 kg 106.7 kg 106.7 kg    Examination: Gen Exam:Alert awake-not in any distress HEENT:atraumatic, normocephalic Chest: B/L clear to auscultation anteriorly CVS:S1S2 regular Abdomen:soft non tender, non distended Extremities:no edema.  Right transmetatarsal amputation-missing numerous toes on the left foot as well. Neurology: Non focal Skin: no rash    Data Reviewed: I have personally reviewed following labs and imaging studies  CBC: Recent Labs  Lab 02/21/21 1237 02/22/21 0730 02/23/21 1052 02/13/2021 0745 02/27/21 0117  WBC 13.8* 12.6* 12.7* 10.6* 10.7*  HGB 7.4* 8.9* 8.2* 8.3* 9.2*  HCT 24.9* 28.4* 26.9* 27.8* 30.7*  MCV 93.3 91.0 93.7 95.2 94.2  PLT 211 229 228 222 [id number]    Basic Metabolic Panel: Recent Labs  Lab 02/21/21 1237 02/22/21 0730 02/23/21 1051 02/26/2021 0745 02/27/21 0117  NA 134*  --  135 135 134*  K 3.7  --  3.6 3.4* 3.5  CL 102  --  100 99 100  CO2 18*  --  '22 25 26  '$ GLUCOSE 166*  --  214* 182* 145*  BUN 56*  --  46* 31* 24*  CREATININE 7.21*  --  5.75* 4.70* 4.18*  CALCIUM 7.6*  --  7.7* 8.1* 8.0*  MG  --  1.6*  --   --   --   PHOS 6.5*  --  5.6* 4.2 3.5    GFR: Estimated Creatinine Clearance: 21.6 mL/min (A) (by C-G formula based on SCr of 4.18 mg/dL (H)). Liver Function Tests: Recent Labs  Lab 02/21/21 1237 02/23/21 1051 02/22/2021 0745 02/27/21 0117  ALBUMIN 1.7* 1.6* 1.6* 1.6*    No results for input(s): LIPASE, AMYLASE in the last 168 hours. No results for input(s): AMMONIA in the last 168 hours. Coagulation Profile: No results for input(s): INR, PROTIME in the last 168 hours. Cardiac Enzymes: No results for input(s): CKTOTAL, CKMB, CKMBINDEX, TROPONINI in the last 168 hours. BNP (last 3  results) No results for input(s): PROBNP in the last 8760 hours. HbA1C: No results for input(s): HGBA1C in the last 72 hours. CBG: Recent Labs  Lab 02/26/21 0750 02/26/21 1154 02/26/21 1656 02/26/21 2051 02/27/21 0750  GLUCAP 164* 157* 121* 137* 156*    Lipid Profile: No results for input(s): CHOL, HDL, LDLCALC, TRIG, CHOLHDL, LDLDIRECT in the last 72 hours. Thyroid Function Tests: No results for input(s): TSH, T4TOTAL, FREET4, T3FREE, THYROIDAB in the last 72 hours. Anemia Panel: No results for input(s): VITAMINB12, FOLATE, FERRITIN, TIBC, IRON, RETICCTPCT in the last 72 hours. Sepsis Labs: Recent Labs  Lab 02/21/21 1237  LATICACIDVEN 1.1     No results found for this or any previous visit (from the past 240 hour(s)).         Radiology Studies: No results found.      Scheduled Meds:  (feeding supplement) PROSource Plus  30  mL Oral BID BM   amiodarone  200 mg Oral BID   apixaban  5 mg Oral BID   atorvastatin  40 mg Oral QPM   chlorhexidine  15 mL Mouth Rinse BID   Chlorhexidine Gluconate Cloth  6 each Topical Q0600   cholestyramine light  4 g Oral BID   clopidogrel  75 mg Oral Daily   darbepoetin (ARANESP) injection - DIALYSIS  200 mcg Intravenous Q Wed-HD   feeding supplement (NEPRO CARB STEADY)  237 mL Oral Q24H   fentaNYL  1 patch Transdermal Q72H   ferric citrate  420 mg Oral BID WC   Gerhardt's butt cream   Topical TID   insulin aspart  0-5 Units Subcutaneous QHS   insulin aspart  0-9 Units Subcutaneous TID WC   insulin glargine-yfgn  5 Units Subcutaneous QHS   latanoprost  1 drop Both Eyes QHS   levothyroxine  25 mcg Oral QAC breakfast   mouth rinse  15 mL Mouth Rinse q12n4p   metoprolol succinate  12.5 mg Oral Daily   midodrine  10 mg Oral TID WC   multivitamin  1 tablet Oral QHS   pantoprazole  40 mg Oral Q0600   polyethylene glycol  17 g Oral Daily   pregabalin  25 mg Oral BID   saccharomyces boulardii  250 mg Oral BID   Continuous  Infusions:  ceFEPime (MAXIPIME) IV 1 g (03/07/2021 1800)   lactated ringers Stopped (02/21/21 1532)     LOS: 20 days    Time spent: 35  minutes.     Oren Binet, MD [facility]   If 7PM-7AM, please contact night-coverage [email or web address]  02/27/2021, 11:38 AM

## 2021-02-27 NOTE — Progress Notes (Addendum)
NT called RN to bedside for patient BP of 58/43 (49).   BP cuff repositioned and BP retaken, 71/43 (58).  Switched BP cuff to legs, BP 51/28 (34).   Patient had been sleepy but was responding and interacting with RNs.    RRT called to assess patient.  MD Ghimire notified.  Patient's SBP normally in the 80-90's.  MD Ghimire advised to give patient's scheduled Midodrine dose and wait 1-1.5 hours before retaking blood pressure.  MD advised if patient asymptomatic, okay for SBP to be in the 70's.  MD Ghimire advised to notify cardiology to discuss if any changes to be made to amiodarone drip.  Paged and spoke with Cadiology fellow MD Nipp.  MD Nipp currently in a patient case, advised will come to bedside and assess patient when he is able.  Advised okay to pause amiodarone drip at this time.

## 2021-02-27 NOTE — Progress Notes (Signed)
[facility] Progress Note   Subjective: Seen in room. Oriented to person, place but speech is slurred, inappropriate affect. HR 122 SBP 90s. Says he just got pain med. Brother at bedside, visibly concerned.      Objective Vitals:   02/27/21 0000 02/27/21 0747 02/27/21 0800 02/27/21 1000  BP: (!) 89/58 (!) 89/58 (!) 87/54 93/65  Pulse: (!) 108 (!) 122 (!) 119 (!) 121  Resp: 18 14 (!) 21 (!) 23  Temp: 98 F (36.7 C) 97.8 F (36.6 C)    TempSrc: Axillary Oral    SpO2: 99% 99% 100% 100%  Weight:      Height:       Physical Exam General: Chronically ill appearing male in NAD Heart: S1,S2 RRR No M/R/G Now is wide complex tachycardia rate 122  Lungs: CTAB, slightly decreased in bases.  Abdomen: No abdominal edema. NABS.  Extremities: No LE edema. Drsg R thigh CDI, [place] dc'd.  Dialysis Access: [facility] Drsg intact   Additional Objective Labs: Basic Metabolic Panel: Recent Labs  Lab 02/23/21 1051 03/05/2021 0745 02/27/21 0117  NA 135 135 134*  K 3.6 3.4* 3.5  CL 100 99 100  CO2 '22 25 26  '$ GLUCOSE 214* 182* 145*  BUN 46* 31* 24*  CREATININE 5.75* 4.70* 4.18*  CALCIUM 7.7* 8.1* 8.0*  PHOS 5.6* 4.2 3.5   Liver Function Tests: Recent Labs  Lab 02/23/21 1051 02/23/2021 0745 02/27/21 0117  ALBUMIN 1.6* 1.6* 1.6*   No results for input(s): LIPASE, AMYLASE in the last 168 hours. CBC: Recent Labs  Lab 02/21/21 1237 02/22/21 0730 02/23/21 1052 03/02/2021 0745 02/27/21 0117  WBC 13.8* 12.6* 12.7* 10.6* 10.7*  HGB 7.4* 8.9* 8.2* 8.3* 9.2*  HCT 24.9* 28.4* 26.9* 27.8* 30.7*  MCV 93.3 91.0 93.7 95.2 94.2  PLT 211 229 228 222 208   Blood Culture    Component Value Date/Time   SDES BLOOD RIGHT ANTECUBITAL 02/08/2021 1110   SPECREQUEST  02/12/2021 1110    BOTTLES DRAWN AEROBIC ONLY Blood Culture adequate volume   CULT  03/04/2021 1110    NO GROWTH 5 DAYS Performed at [facility], [place] [address], [place], [place] [address]    REPTSTATUS 02/12/2021  FINAL 03/09/2021 1110    Cardiac Enzymes: No results for input(s): CKTOTAL, CKMB, CKMBINDEX, TROPONINI in the last 168 hours. CBG: Recent Labs  Lab 02/26/21 0750 02/26/21 1154 02/26/21 1656 02/26/21 2051 02/27/21 0750  GLUCAP 164* 157* 121* 137* 156*   Iron Studies: No results for input(s): IRON, TIBC, TRANSFERRIN, FERRITIN in the last 72 hours. '@lablastinr3'$ @ Studies/Results: No results found. Medications:  ceFEPime (MAXIPIME) IV 1 g (03/09/2021 1800)   lactated ringers Stopped (02/21/21 1532)    (feeding supplement) PROSource Plus  30 mL Oral BID BM   amiodarone  200 mg Oral BID   apixaban  5 mg Oral BID   atorvastatin  40 mg Oral QPM   chlorhexidine  15 mL Mouth Rinse BID   Chlorhexidine Gluconate Cloth  6 each Topical Q0600   cholestyramine light  4 g Oral BID   clopidogrel  75 mg Oral Daily   darbepoetin (ARANESP) injection - DIALYSIS  200 mcg Intravenous Q Wed-HD   docusate sodium  100 mg Oral BID   feeding supplement (NEPRO CARB STEADY)  237 mL Oral Q24H   fentaNYL  1 patch Transdermal Q72H   ferric citrate  420 mg Oral BID WC   Gerhardt's butt cream   Topical TID  insulin aspart  0-5 Units Subcutaneous QHS   insulin aspart  0-9 Units Subcutaneous TID WC   insulin glargine-yfgn  5 Units Subcutaneous QHS   latanoprost  1 drop Both Eyes QHS   levothyroxine  25 mcg Oral QAC breakfast   mouth rinse  15 mL Mouth Rinse q12n4p   metoprolol succinate  12.5 mg Oral Daily   midodrine  10 mg Oral TID WC   multivitamin  1 tablet Oral QHS   pantoprazole  40 mg Oral Q0600   pregabalin  25 mg Oral BID   saccharomyces boulardii  250 mg Oral BID     Dialysis Orders: [facility]: MWF 4 hrs F250 450/800 3.0K/2.5 Ca LIJ TDC  EDW 111 kg -Heparin 4900 units IV initial bolus, 500 units hourly, DC last hour of tx   Assessment/Plan: AMS-slurring words. Probably related to pain meds. Notified primary. Currently on Dilaudid 2 mg PO q six hours. Recommend decreasing dose and frequency in  ESRD pt.  R. Thigh Abscess: s/p I&D on 8/11. Wound Vac off. Per primary. Continue cefepime with HD until 03/03/21  ESRD - On HD MWF.  Next HD 02/28/2021 Hypotension/volume-Have attempted to challenge and lower EDW since admit. Using Midodrine with extra 10 mg PO mid run to offset intradialyic hypotension. Continue lowering volume as tolerated. Not tolerating much UF Minimal UF with HD 02/19/2021.   AFIB: On amiodarone, apixaban and low-dose metoprolol. Cardiology following -plans to go ahead with cardioversion, CT didn't show evidence of thrombus. Went for cardioversion, 02/26/21 ultimately was paced out of AVNRT into SR. However now back in wide complex tachycardia rate 122  Anemia  - Hgb 9.2 Transfused 1 U prbc on 8/15.  Aranesp increased to 200 q Wed starting 8/17.  Metabolic bone disease - Corr Ca/Phos ok. Continue Auryxia 2 tab PO BID  Nutrition - Renal/Carb mod diet with protein supps, renal vits. CM EF 30-35% ICD in place  DMT2-per primary Hypothyroidism-per primary Disposition: Pending SNF placement.   Cordero Surette H. Samari Bittinger NP-C 02/27/2021, 11:23 AM  [employer] [phone]

## 2021-02-27 NOTE — Progress Notes (Signed)
Progress Note  Patient Name: Kyle Walton Date of Encounter: 02/27/2021  [facility] Cardiologist: Lauree Chandler, MD   Subjective   No chest pain or sense of palpitations but heart rate has been elevated since yesterday.  Leg pain controlled.  Inpatient Medications    Scheduled Meds:  (feeding supplement) PROSource Plus  30 mL Oral BID BM   amiodarone  200 mg Oral BID   apixaban  5 mg Oral BID   atorvastatin  40 mg Oral QPM   chlorhexidine  15 mL Mouth Rinse BID   Chlorhexidine Gluconate Cloth  6 each Topical Q0600   cholestyramine light  4 g Oral BID   clopidogrel  75 mg Oral Daily   darbepoetin (ARANESP) injection - DIALYSIS  200 mcg Intravenous Q Wed-HD   feeding supplement (NEPRO CARB STEADY)  237 mL Oral Q24H   fentaNYL  1 patch Transdermal Q72H   ferric citrate  420 mg Oral BID WC   Gerhardt's butt cream   Topical TID   insulin aspart  0-5 Units Subcutaneous QHS   insulin aspart  0-9 Units Subcutaneous TID WC   insulin glargine-yfgn  5 Units Subcutaneous QHS   latanoprost  1 drop Both Eyes QHS   levothyroxine  25 mcg Oral QAC breakfast   mouth rinse  15 mL Mouth Rinse q12n4p   metoprolol succinate  12.5 mg Oral Daily   midodrine  10 mg Oral TID WC   multivitamin  1 tablet Oral QHS   pantoprazole  40 mg Oral Q0600   polyethylene glycol  17 g Oral Daily   pregabalin  25 mg Oral BID   saccharomyces boulardii  250 mg Oral BID   Continuous Infusions:  ceFEPime (MAXIPIME) IV 1 g (02/26/2021 1800)   lactated ringers Stopped (02/21/21 1532)   PRN Meds: acetaminophen **OR** acetaminophen, clonazepam, HYDROmorphone, liver oil-zinc oxide, nitroGLYCERIN, ondansetron **OR** ondansetron (ZOFRAN) IV, polyethylene glycol   Vital Signs    Vitals:   02/26/21 1942 02/27/21 0000 02/27/21 0747 02/27/21 1000  BP:  (!) 89/58 (!) 89/58 93/65  Pulse:  (!) 108 (!) 122 (!) 121  Resp:  '18 14 14  '$ Temp: 98 F (36.7 C) 98 F (36.7 C) 97.8 F (36.6 C)   TempSrc: Oral  Axillary Oral   SpO2:  99% 99% 100%  Weight:      Height:        Intake/Output Summary (Last 24 hours) at 02/27/2021 1207 Last data filed at 02/27/2021 1100 Gross per 24 hour  Intake 240 ml  Output --  Net 240 ml   Last 3 Weights 02/26/2021 02/26/2021 02/22/2021  Weight (lbs) 235 lb 3.7 oz 235 lb 3.7 oz 235 lb 0.2 oz  Weight (kg) 106.7 kg 106.7 kg 106.6 kg      Telemetry    SVT in the 120s.  Has had episodes of atrial pacing with normal heart rate however. Personally Reviewed  ECG    No ECG reviewed today.  Physical Exam   GEN: No acute distress.   Neck: No JVD Cardiac: Rapid regular rhythm, no gallops.  Respiratory: Clear to auscultation bilaterally. GI: Soft, nontender, non-distended  MS: No edema; status post right transmetatarsal amputation, also left toe amputations.. Neuro:  Nonfocal  Psych: Normal affect   Labs    Chemistry Recent Labs  Lab 02/23/21 1051 03/07/2021 0745 02/27/21 0117  NA 135 135 134*  K 3.6 3.4* 3.5  CL 100 99 100  CO2 '22 25 26  '$ GLUCOSE 214*  182* 145*  BUN 46* 31* 24*  CREATININE 5.75* 4.70* 4.18*  CALCIUM 7.7* 8.1* 8.0*  ALBUMIN 1.6* 1.6* 1.6*  GFRNONAA 10* 13* 15*  ANIONGAP '13 11 8     '$ Hematology Recent Labs  Lab 02/23/21 1052 02/15/2021 0745 02/27/21 0117  WBC 12.7* 10.6* 10.7*  RBC 2.87* 2.92* 3.26*  HGB 8.2* 8.3* 9.2*  HCT 26.9* 27.8* 30.7*  MCV 93.7 95.2 94.2  MCH 28.6 28.4 28.2  MCHC 30.5 29.9* 30.0  RDW 19.6* 19.9* 20.3*  PLT 228 222 208     Radiology    No results found.  Cardiac Studies   Echocardiogram 02/21/2021:  1. Left ventricular ejection fraction, by estimation, is 30 to 35%. The  left ventricle has moderately decreased function. The left ventricle  demonstrates global hypokinesis. There is mild left ventricular  hypertrophy. Left ventricular diastolic  parameters are indeterminate.   2. Right ventricular systolic function was not well visualized. The right  ventricular size is normal There is a  small (< 1 cm) echodensity on the RV  device lead best seen on image 16.   3. The mitral valve is abnormal. Trivial mitral valve regurgitation. No  evidence of mitral stenosis. Moderate mitral annular calcification.   4. The aortic valve is tricuspid. Aortic valve regurgitation is not  visualized. Mild to moderate aortic valve sclerosis/calcification is  present, without any evidence of aortic stenosis.   5. The inferior vena cava is dilated in size with <50% respiratory  variability, suggesting right atrial pressure of 15 mmHg.   Patient Profile     65 y.o. male with complicated PMH of CAD with history of CABG x3 (LIMA-LAD, SVG-PDA, SVG-OM2) in 2017 at [facility], chronic systolic heart failure with EF 30 to 35%, hx of paroxysmal AF, hx SVT/VT, s/p dual-chamber Medtronic ICD, ESRD on HD, anemia of CKD, type 2 DM, paroxysmal a flutter/fibrillation, COPD, OSA on BiPAP, chronic hypoxic respiratory failure on 2LNC , hypothyroidism, currently admitted for complicated recurrent right thigh abscess requiring I&D/wound VAC application /wound care/prolonged antibiotic course, cardiology is consulted on 02/16/2021 for atrial flutter/fibrillation with RVR. He underwent a DCCV on 02/14/2021, later had recurrence of SVT.   Assessment & Plan    Paroxysmal A. Fib/flutter with RVR - per care everywhere, history of CTI and ablation at [facility] 06/2017. Noted to have SVT with aberrancy, started on amiodarone and beta blocker in 04/2018. Long pattern of no shows/cancelled appts. 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 Audie Box and Dr. Harrell Gave subsequently - will transition to p.o. amiodarone to IV load  - continue Eliquis  - consider EP evaluation tomorrow   Chronic systolic heart failure S/P ICD placement - Echo from 02/21/21 30-35%, LV global hypokinesis, trivial MR, mild to moderate aortic sclerosis (not significantly changed from echo 09/17/2020 - Volume  managed by dialysis, euvolemic at this time - GDMT: Low-dose metoprolol XL 12.'5mg'$  daily, BP not tolerating additional agents  CAD with hx of CABG 2017  -Continue medical therapy Lipitor, Plavix, and metoprolol  ESRD Right thigh abscess Hypotension Chronic hypoxic respiratory failure OSA Hypothyroidism Type 2 DM    For questions or updates, please contact [facility] Please consult [email or web address] for contact info under      Signed, Margie Billet, NP  02/27/2021, 12:07 PM     Attending note:  Patient seen and examined.  Chart reviewed including rhythm management this past Friday.  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  He is already on low-dose Toprol-XL and oral amiodarone.  Over the weekend he has had episodes of recurrent SVT, persisting today, did have an atrial paced rhythm yesterday.  Tolerating reasonably well at this time, plan is to convert from oral amiodarone to IV amiodarone load without bolus.  If this is ineffective, can consider either giving a dose of adenosine or pacing him out of this again, although long-term rhythm treatment is the bigger question.  Satira Sark, M.D., F.A.C.C.

## 2021-02-27 NOTE — Progress Notes (Signed)
  Amiodarone Drug - Drug Interaction Consult Note  Recommendations: No adjustments are necessary, however, patient has fentanyl patches, be sure to monitor cardiac output and at higher risk for fentanyl toxicity. Additionally, zofran may prolong QT interval. Please continue to monitor the patient for these side effects. Clopidogrel may increase amiodarone exposure. Apixaban does not warrant adjustments. Amiodarone is metabolized by the cytochrome P450 system and therefore has the potential to cause many drug interactions. Amiodarone has an average plasma half-life of 50 days (range 20 to 100 days).   There is potential for drug interactions to occur several weeks or months after stopping treatment and the onset of drug interactions may be slow after initiating amiodarone.   '[x]'$  Statins: Increased risk of myopathy. Simvastatin- restrict dose to '20mg'$  daily. Other statins: counsel patients to report any muscle pain or weakness immediately.  '[]'$  Anticoagulants: Amiodarone can increase anticoagulant effect. Consider warfarin dose reduction. Patients should be monitored closely and the dose of anticoagulant altered accordingly, remembering that amiodarone levels take several weeks to stabilize.  '[x]'$  Beta blockers: increased risk of bradycardia, AV block and myocardial depression. Sotalol - avoid concomitant use.  '[x]'$  Drugs that prolong the QT interval:  Torsades de pointes risk may be increased with concurrent use - avoid if possible.  Monitor QTc, also keep magnesium/potassium WNL if concurrent therapy can't be avoided.  Antibiotics: e.g. fluoroquinolones, erythromycin.  Antiarrhythmics: e.g. quinidine, procainamide, disopyramide, sotalol.  Antipsychotics: e.g. phenothiazines, haloperidol.   Lithium, tricyclic antidepressants, and methadone.   Thank You,   Varney Daily, PharmD PGY1 Pharmacy Resident  Please check AMION for all [facility] pharmacy phone numbers After 10:00 PM call main pharmacy  [phone]

## 2021-02-27 NOTE — Progress Notes (Signed)
RN stated he would assist patient with CPAP when patient is ready. RT available if needed.

## 2021-02-27 NOTE — TOC Progression Note (Signed)
Transition of Care [facility]) - Progression Note    Patient Details  Name: Kyle Walton MRN: [id number] Date of Birth: 05/24/1956  Transition of [facility]) CM/SW Contact  [name] [place], [place] Phone Number: 02/27/2021, 2:34 PM  Clinical Narrative:    Power of Attorney and Living Will completed and notarized with patient and patient's spouse by hospital notary. Patient's spouse has all copies.  Transition of Care to continue to follow for discharge needs.  [address], LCSW Transition of Care [phone]    Expected Discharge Plan: Skilled Nursing Facility Barriers to Discharge: Insurance Authorization, Continued Medical Work up  Expected Discharge Plan and Services Expected Discharge Plan: [place] In-house Referral: Clinical Social Work   Post Acute Care Choice: [place] Living arrangements for the past 2 months: [place]                                       Social Determinants of Health (SDOH) Interventions    Readmission Risk Interventions Readmission Risk Prevention Plan 02/16/2021 01/28/2021  Transportation Screening Complete Complete  Medication Review (RN Care Manager) Referral to Pharmacy Referral to Pharmacy  PCP or Specialist appointment within 3-5 days of discharge Complete Complete  HRI or Home Care Consult Complete Complete  SW Recovery Care/Counseling Consult Complete Complete  Palliative Care Screening Not Applicable Not Applicable  Skilled Nursing Facility Complete Complete

## 2021-02-27 NOTE — Significant Event (Signed)
Rapid Response Event Note   Reason for Call :  Hypotension  Initial Focused Assessment:  Called because of this patient's low BP readings. The patient is asymptomatic at the time. He is AO x4 and most converse at while I was assessing him. The patient has a wet cough that is nonproductive at the time.   BP 66/38 and 74/55 (1838) HR 118 RR 21 O2 97 2L [place]   Interventions:  Patient repositioned Amiodarone stopped until cardiology gives the OK to restart gtt Vitals taken  Plan of Care:  Patient will be seen by cardiology to evaluate his BP. Attending MD made aware of hypotension. Patient just received his 10 mg of Midodrine for BP. No fluids given at the time with his history of HF, ESRD, and rhonchus lung sounds.   Event Summary:   MD Notified: Dr. Sloan Leiter and Cards Call Time: [id number] Arrival Time: [place] Time: [place], RN

## 2021-02-27 NOTE — Progress Notes (Signed)
Patient insisted that I return later as he was waiting on a bath. Upon return, patient has been vomiting and is nauseous. Will hold off on BiPAP at this time. Advised patient if he starts feeling better to call and we can place BiPAP on at that time. RN made aware.

## 2021-02-28 ENCOUNTER — Encounter (HOSPITAL_COMMUNITY): Payer: Self-pay | Admitting: Cardiovascular Disease

## 2021-02-28 LAB — CBC
HCT: 28 % — ABNORMAL LOW (ref 39.0–52.0)
Hemoglobin: 8.4 g/dL — ABNORMAL LOW (ref 13.0–17.0)
MCH: 28.9 pg (ref 26.0–34.0)
MCHC: 30 g/dL (ref 30.0–36.0)
MCV: 96.2 fL (ref 80.0–100.0)
Platelets: 204 10*3/uL (ref 150–400)
RBC: 2.91 MIL/uL — ABNORMAL LOW (ref 4.22–5.81)
RDW: 20.3 % — ABNORMAL HIGH (ref 11.5–15.5)
WBC: 12.3 10*3/uL — ABNORMAL HIGH (ref 4.0–10.5)
nRBC: 0.2 % (ref 0.0–0.2)

## 2021-02-28 LAB — TYPE AND SCREEN
ABO/RH(D): B POS
Antibody Screen: NEGATIVE

## 2021-02-28 LAB — GLUCOSE, CAPILLARY
Glucose-Capillary: 237 mg/dL — ABNORMAL HIGH (ref 70–99)
Glucose-Capillary: 246 mg/dL — ABNORMAL HIGH (ref 70–99)
Glucose-Capillary: 192 mg/dL — ABNORMAL HIGH (ref 70–99)
Glucose-Capillary: 188 mg/dL — ABNORMAL HIGH (ref 70–99)

## 2021-02-28 MED ORDER — IPRATROPIUM-ALBUTEROL 0.5-2.5 (3) MG/3ML IN SOLN
RESPIRATORY_TRACT | Status: AC
  Administered 2021-02-28: 3 mL
  Filled 2021-02-28: qty 3

## 2021-02-28 MED ORDER — IPRATROPIUM-ALBUTEROL 0.5-2.5 (3) MG/3ML IN SOLN: 3.0000 mL | Freq: Two times a day (BID) | RESPIRATORY_TRACT | Status: DC

## 2021-02-28 MED ORDER — ALBUMIN HUMAN 25 % IV SOLN
INTRAVENOUS | Status: AC
  Filled 2021-02-28: qty 150

## 2021-02-28 MED ORDER — GUAIFENESIN ER 600 MG PO TB12
600.0000 mg | ORAL_TABLET | Freq: Two times a day (BID) | ORAL | Status: DC
  Administered 2021-03-01 (×2): 600 mg via ORAL
  Filled 2021-02-28 (×2): qty 1

## 2021-02-28 MED ORDER — ALBUMIN HUMAN 25 % IV SOLN
INTRAVENOUS | Status: AC
  Administered 2021-02-28: 50 g via INTRAVENOUS
  Filled 2021-02-28: qty 50

## 2021-02-28 MED ORDER — IPRATROPIUM-ALBUTEROL 0.5-2.5 (3) MG/3ML IN SOLN
3.0000 mL | Freq: Three times a day (TID) | RESPIRATORY_TRACT | Status: DC
  Administered 2021-02-28: 3 mL via RESPIRATORY_TRACT
  Filled 2021-02-28: qty 3

## 2021-02-28 MED ORDER — ALBUTEROL SULFATE (2.5 MG/3ML) 0.083% IN NEBU: 2.5000 mg | INHALATION_SOLUTION | RESPIRATORY_TRACT | Status: DC | PRN

## 2021-02-28 NOTE — Progress Notes (Signed)
TR[facility] night shift PCU coverage note.  The nursing staff reported patient has been having soft or hypotensive blood pressure readings. Amiodarone was held earlier due to hypotension.  He is anemic and has an albumin level of only 1.6 g/dL.  He had an episode of rectal bleeding.  CBC showed that his hemoglobin decreased from 9.2 to 8.7 g/dL.  A type and screen was also obtained in case that hemoglobin level drops further and transfusion is required.  Albumin 50 g along with 250 mL of normal saline bolus ordered.  Tennis Must, MD.

## 2021-02-28 NOTE — Progress Notes (Addendum)
Subjective: Awaiting dialysis today, noted prior episode last night hypotension, albumin 50 g along with D50 insulin given, now appears at baseline systolic [id number], just feels tired no shortness of breath no chest pain and is normal MS.  Per IM episode of rectal bleed hemoglobin 8.4 this a.m. use no heparin on dialysis if continues to drop would need GI eval  Objective Vital signs in last 24 hours: Vitals:   02/28/21 0441 02/28/21 0500 02/28/21 0749 02/28/21 0810  BP: (!) 83/55   (!) 87/49  Pulse: (!) 115   64  Resp: 18   (!) 22  Temp: 97.8 F (36.6 C)   98.4 F (36.9 C)  TempSrc: Axillary   Oral  SpO2: 97%  96%   Weight:  108.1 kg    Height:       Weight change:   Physical Exam: General: Alert O x3 chronically ill appearing male in NAD Heart: Tacky regular No M/R/G  Lungs: Slightly coarse mild overt wheezing or rales nonlabored breathing on nasal cannula oxygen 97% O2 sat,  Abdomen: NABS.,  Obese soft nontender nondistended but does have some abdominal wall edema Extremities: No LE edema. Drsg R thigh CDI, right TMA ,left partial TMA  Dialysis Access: LIJ TDC Drsg intact    Dialysis Orders: [facility]: MWF 4 hrs F250 450/800 3.0K/2.5 Ca LIJ TDC  EDW 111 kg -Heparin 4900 units IV initial bolus, 500 units hourly, DC last hour of tx   Assessment/Plan:  R. Thigh Abscess: s/p I&D on 8/11. Wound Vac off. Per primary. Continue cefepime with HD until 03/03/21  ESRD - On HD MWF.  HD 02/28/2021 Hypotension/volume-Have attempted to challenge and lower EDW since admit. Using Midodrine with extra 10 mg PO mid run to offset intradialyic hypotension. Continue lowering volume as tolerated. Not tolerating much UF Minimal UF with HD 02/11/2021.  Has some minimal abdominal wall edema AMS-slurring words. Probably related to pain meds.  Shon Baton, NP notified primary." Recommend decreasing Dilaudid 2 mg PO q six hours. in ESRD pt  " AFIB: This a.m. regular on exam on amiodarone, apixaban and  low-dose metoprolol. Cardiology following -CT didn't show evidence of thrombus. Went for cardioversion, 02/20/2021 ultimately was paced out of AVNRT into SR. further plans per cardiology consider EP eval  Anemia  - Hgb 9.2 > 8.4 ,noted IM reported 1 episode of rectal bleed, transfused 1 U prbc on 8/15.  Aranesp increased to 200 q Wed starting 8/17.  Plans per IM, no heparin dialysis Metabolic bone disease - Corr Ca/Phos ok. Continue Auryxia 2 tab PO BID  Nutrition -now on carb mod diet with protein supps, renal vits.  Potassium and phosphorus okay without restrictions with low albumin CM EF 30-35% ICD in place  DMT2-per primary Hypothyroidism-per primary Disposition: Pending SNF placement.     Ernest Haber, PA-C [facility] Beeper [phone] 02/28/2021,12:01 PM  LOS: 21 days   Labs: Basic Metabolic Panel: Recent Labs  Lab 02/23/21 1051 02/27/2021 0745 02/27/21 0117  NA 135 135 134*  K 3.6 3.4* 3.5  CL 100 99 100  CO2 '22 25 26  '$ GLUCOSE 214* 182* 145*  BUN 46* 31* 24*  CREATININE 5.75* 4.70* 4.18*  CALCIUM 7.7* 8.1* 8.0*  PHOS 5.6* 4.2 3.5   Liver Function Tests: Recent Labs  Lab 02/23/21 1051 02/20/2021 0745 02/27/21 0117  ALBUMIN 1.6* 1.6* 1.6*   No results for input(s): LIPASE, AMYLASE in the last 168 hours. No results for input(s): AMMONIA in the last 168  hours. CBC: Recent Labs  Lab 02/23/21 1052 02/23/2021 0745 02/27/21 0117 02/27/21 2336 02/28/21 0705  WBC 12.7* 10.6* 10.7* 12.8* 12.3*  HGB 8.2* 8.3* 9.2* 8.7* 8.4*  HCT 26.9* 27.8* 30.7* 29.1* 28.0*  MCV 93.7 95.2 94.2 95.4 96.2  PLT 228 222 208 216 204   Cardiac Enzymes: No results for input(s): CKTOTAL, CKMB, CKMBINDEX, TROPONINI in the last 168 hours. CBG: Recent Labs  Lab 02/27/21 0750 02/27/21 1233 02/27/21 1723 02/27/21 2112 02/28/21 0813  GLUCAP 156* 205* 240* 240* 237*    Studies/Results: No results found. Medications:  amiodarone     ceFEPime (MAXIPIME) IV Stopped (02/27/21  1823)   lactated ringers Stopped (02/21/21 1532)    (feeding supplement) PROSource Plus  30 mL Oral BID BM   apixaban  5 mg Oral BID   atorvastatin  40 mg Oral QPM   chlorhexidine  15 mL Mouth Rinse BID   Chlorhexidine Gluconate Cloth  6 each Topical Q0600   cholestyramine light  4 g Oral BID   clopidogrel  75 mg Oral Daily   darbepoetin (ARANESP) injection - DIALYSIS  200 mcg Intravenous Q Wed-HD   feeding supplement (NEPRO CARB STEADY)  237 mL Oral Q24H   fentaNYL  1 patch Transdermal Q72H   ferric citrate  420 mg Oral BID WC   Gerhardt's butt cream   Topical TID   insulin aspart  0-5 Units Subcutaneous QHS   insulin aspart  0-9 Units Subcutaneous TID WC   insulin glargine-yfgn  5 Units Subcutaneous QHS   latanoprost  1 drop Both Eyes QHS   levothyroxine  25 mcg Oral QAC breakfast   mouth rinse  15 mL Mouth Rinse q12n4p   metoprolol succinate  12.5 mg Oral Daily   midodrine  10 mg Oral TID WC   multivitamin  1 tablet Oral QHS   pantoprazole  40 mg Oral Q0600   polyethylene glycol  17 g Oral Daily   pregabalin  25 mg Oral BID   saccharomyces boulardii  250 mg Oral BID

## 2021-02-28 NOTE — Progress Notes (Signed)
Progress Note  Patient Name: Kyle Walton Date of Encounter: 02/28/2021  [facility] Cardiologist: Lauree Chandler, MD   Subjective   Continues to feel tired this morning. Coughing has persistent which he states he has to use the suction tool to clear the phlegm as he is unable to cough it up. No complaints of chest pain, SOB, palpitations, dizziness, lightheadedness, or syncope.   Inpatient Medications    Scheduled Meds:  (feeding supplement) PROSource Plus  30 mL Oral BID BM   apixaban  5 mg Oral BID   atorvastatin  40 mg Oral QPM   chlorhexidine  15 mL Mouth Rinse BID   Chlorhexidine Gluconate Cloth  6 each Topical Q0600   cholestyramine light  4 g Oral BID   clopidogrel  75 mg Oral Daily   darbepoetin (ARANESP) injection - DIALYSIS  200 mcg Intravenous Q Wed-HD   feeding supplement (NEPRO CARB STEADY)  237 mL Oral Q24H   fentaNYL  1 patch Transdermal Q72H   ferric citrate  420 mg Oral BID WC   Gerhardt's butt cream   Topical TID   insulin aspart  0-5 Units Subcutaneous QHS   insulin aspart  0-9 Units Subcutaneous TID WC   insulin glargine-yfgn  5 Units Subcutaneous QHS   latanoprost  1 drop Both Eyes QHS   levothyroxine  25 mcg Oral QAC breakfast   mouth rinse  15 mL Mouth Rinse q12n4p   metoprolol succinate  12.5 mg Oral Daily   midodrine  10 mg Oral TID WC   multivitamin  1 tablet Oral QHS   pantoprazole  40 mg Oral Q0600   polyethylene glycol  17 g Oral Daily   pregabalin  25 mg Oral BID   saccharomyces boulardii  250 mg Oral BID   Continuous Infusions:  amiodarone     ceFEPime (MAXIPIME) IV Stopped (02/27/21 1823)   lactated ringers Stopped (02/21/21 1532)   PRN Meds: acetaminophen **OR** acetaminophen, clonazepam, HYDROmorphone, liver oil-zinc oxide, nitroGLYCERIN, ondansetron **OR** ondansetron (ZOFRAN) IV, polyethylene glycol   Vital Signs    Vitals:   02/28/21 0441 02/28/21 0500 02/28/21 0749 02/28/21 0810  BP: (!) 83/55   (!) 87/49  Pulse:  (!) 115   64  Resp: 18   (!) 22  Temp: 97.8 F (36.6 C)   98.4 F (36.9 C)  TempSrc: Axillary   Oral  SpO2: 97%  96%   Weight:  108.1 kg    Height:        Intake/Output Summary (Last 24 hours) at 02/28/2021 0847 Last data filed at 02/28/2021 0300 Gross per 24 hour  Intake 1231.41 ml  Output --  Net 1231.41 ml   Last 3 Weights 02/28/2021 02/26/2021 02/26/2021  Weight (lbs) 238 lb 5.1 oz 235 lb 3.7 oz 235 lb 3.7 oz  Weight (kg) 108.1 kg 106.7 kg 106.7 kg      Telemetry    AVNRT in the 120s which broke with restoration of NSR around 7:30am 02/28/21 - Personally Reviewed  ECG    No new tracings - Personally Reviewed  Physical Exam   GEN: No acute distress.   Neck: No JVD Cardiac: RRR, no murmurs, rubs, or gallops.  Respiratory: rhonchorous, no overt wheezing/rales. 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 Neuro:  Nonfocal  Psych: Normal affect   Labs    High Sensitivity Troponin:  No results for input(s): TROPONINIHS in the last  720 hours.    Chemistry Recent Labs  Lab 02/23/21 1051 03/03/2021 0745 02/27/21 0117  NA 135 135 134*  K 3.6 3.4* 3.5  CL 100 99 100  CO2 '22 25 26  '$ GLUCOSE 214* 182* 145*  BUN 46* 31* 24*  CREATININE 5.75* 4.70* 4.18*  CALCIUM 7.7* 8.1* 8.0*  ALBUMIN 1.6* 1.6* 1.6*  GFRNONAA 10* 13* 15*  ANIONGAP '13 11 8     '$ Hematology Recent Labs  Lab 02/27/21 0117 02/27/21 2336 02/28/21 0705  WBC 10.7* 12.8* 12.3*  RBC 3.26* 3.05* 2.91*  HGB 9.2* 8.7* 8.4*  HCT 30.7* 29.1* 28.0*  MCV 94.2 95.4 96.2  MCH 28.2 28.5 28.9  MCHC 30.0 29.9* 30.0  RDW 20.3* 20.5* 20.3*  PLT 208 216 204    BNPNo results for input(s): BNP, PROBNP in the last 168 hours.   DDimer No results for input(s): DDIMER in the last 168 hours.   Radiology    No results found.  Cardiac Studies   Echocardiogram 02/21/2021:  1. Left ventricular ejection fraction, by estimation, is 30 to 35%.  The  left ventricle has moderately decreased function. The left ventricle  demonstrates global hypokinesis. There is mild left ventricular  hypertrophy. Left ventricular diastolic  parameters are indeterminate.   2. Right ventricular systolic function was not well visualized. The right  ventricular size is normal There is a small (< 1 cm) echodensity on the RV  device lead best seen on image 16.   3. The mitral valve is abnormal. Trivial mitral valve regurgitation. No  evidence of mitral stenosis. Moderate mitral annular calcification.   4. The aortic valve is tricuspid. Aortic valve regurgitation is not  visualized. Mild to moderate aortic valve sclerosis/calcification is  present, without any evidence of aortic stenosis.   5. The inferior vena cava is dilated in size with <50% respiratory  variability, suggesting right atrial pressure of 15 mmHg.   Patient Profile     65 y.o. male with complicated PMH of CAD with history of CABG x3 (LIMA-LAD, SVG-PDA, SVG-OM2) in 2017 at [facility], chronic systolic heart failure with EF 30 to 35%, hx of paroxysmal AF, hx SVT/VT, s/p dual-chamber Med[employer]onic ICD, ESRD on HD, anemia of CKD, type 2 DM, paroxysmal a flutter/fibrillation, COPD, OSA on BiPAP, chronic hypoxic respiratory failure on 2LNC , hypothyroidism, currently admitted for complicated recurrent right thigh abscess requiring I&D/wound VAC application /wound care/prolonged antibiotic course, cardiology is consulted on 02/16/2021 for atrial flutter/fibrillation with RVR. He underwent a DCCV on 02/21/2021, later had recurrence of SVT.   Assessment & Plan    1. Paroxysmal A. Fib/flutter with RVR/AVNRT: per care everywhere, history of CTI and ablation at [facility] 06/2017. Noted to have SVT with aberrancy, started on amiodarone and beta blocker in 04/2018. Long pattern of no shows/cancelled appts. This admission he has undergone DCCV on 02/18/2021, however shortly returned to AVNRT after cardioversion, and was  subsequently paced out of AVNRT by Dr. Audie Box and Dr. Harrell Gave. 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 Appears NSR restored around 7:30am this morning.  - Continue IV amiodarone for rhythm control - Continue Eliquis for stroke ppx - Hypotension limits AV nodal blocking agents - continue metoprolol succinate 12.'5mg'$  daily as BP will allow - Consider EP evaluation if recurrence   2. Chronic systolic heart failure s/p ICD placement:  Echo from 02/21/21 30-35%, LV global hypokinesis, trivial MR, mild to moderate aortic sclerosis (not significantly changed from echo 09/17/2020. GDMT has been limited  by hypotension and CKD.  - Continue volume management per nephrology at HD.  - Continue metoprolol succinate as BP will allow - Continue to monitor strict I&Os and daily weights   3. CAD with hx of CABG 2017: No anginal complaints. - Continue medical therapy Lipitor, Plavix, and metoprolol  4. Hypotension: BP quite low yesterday evening, down to 50s/40s at one point. Appears to be about baseline this morning in the 80s/40s.  - Continue midodrine '10mg'$  TID  5. 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 No claudication complaints - Continue plavix and statin   Remainder of care per primary team: ESRD Right thigh abscess Hypotension Chronic hypoxic respiratory failure/COPD OSA Hypothyroidism Type 2 DM      For questions or updates, please contact [facility] Please consult [email or web address] for contact info under        Signed, Abigail Butts, PA-C  02/28/2021, 8:47 AM

## 2021-02-28 NOTE — Progress Notes (Signed)
PROGRESS NOTE    Kyle Walton  [id number] DOB: [birth year] DOA: 03/06/2021 PCP: Patient, No Pcp Per (Inactive)   Brief Narrative: 22 chronically ill with ESRD, anemia of chronic disease, type 2 diabetes, chronic diastolic heart failure, paroxysmal A. fib on Eliquis, COPD chronic hypoxic respiratory failure on 2 L of oxygen, nocturnal BiPAP, hypothyroidism recently discharged from [facility] 7/26-after he was treated for right thigh abscess-he had undergone I&D on 7/14.  He was discharged to SNF on oral antimicrobial therapy-while at SNF-he was noted to have recurrence of his right thigh abscess-hence he was admitted to the hospitalist service.  He was started on broad-spectrum antimicrobial therapy-and underwent repeat I&D on 8/11.  He was evaluated by infectious disease with recommendations to continue with cefepime for 2 weeks with HD until 8/25.  See below for further details.  Subjective: Developed hypotension yesterday-after getting started on amiodarone infusion for SVT.  Subsequently blood pressure somewhat stabilized and went back to baseline (has chronic hypertension) after stopping amiodarone infusion.  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  Per Ortho-sutures need to be maintained until 3 weeks postoperative (around 9/1).  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  CT cardiac was negative for LV thrombus.  Cardiology following and directing care.  Watch closely for development of hypotension-also on beta-blocker that may need to be discontinued (have written for holding parameters)  Chronic systolic heart failure (EF 30-35% by echo  on 8/15)-s/p ICD placement: Volume status stable-diuresis with HD-has chronic hypotension-and can only tolerate low-dose metoprolol.  Chronic hypotension: Continue midodrine-he remains asymptomatic with this low blood pressure.  ESRD on HD TTS: Nephrology following and directing care.  Anemia: Due to ESRD-worsened by acute illness-continue to follow CBC.  Has required PRBC transfusion.  IV iron/Aranesp defer to nephrology.   History of CAD s/p CABG: Continue Plavix/beta-blocker/statin.  Chronic hypoxic respiratory failure/OSA: Continue BiPAP nightly.  Hypothyroidism: Continue levothyroxine  DM-2 (A1c 7.2 on 8/1): CBG stable-continue Lantus 5 units daily/SSI.    Recent Labs    02/27/21 2112 02/28/21 0813 02/28/21 1228  GLUCAP 240* 16* 70*     Legally blind  History of PAD-s/p right transmetatarsal amputation, and s/p amputation of several toes on the left foot.  Obesity: Estimated body mass index is 34.2 kg/m as calculated from the following:   Height as of this encounter: '5\' 10"'$  (1.778 m).   Weight as of this encounter: 108.1 kg.   DVT prophylaxis: Eliquis Code Status: Full Code Family Communication: None at bedside-we will discuss with family over the next few days. Disposition Plan:  Status is: Inpatient  Remains inpatient appropriate because:IV treatments appropriate due to intensity of illness or inability to take PO  Dispo: The patient is from: SNF              Anticipated d/c is to: SNF (insurance refused LTAC)              Patient currently is not medically stable to d/c.   Difficult to place patient No   Consultants:  Ortho Nephrology Cardiology  Procedures:  I and D on 8/11 [facility] IV cardioversion 8/19  Antimicrobials:     Objective: Vitals:   02/28/21 0500 02/28/21 0749 02/28/21 0810 02/28/21 1225  BP:   (!) 87/49 (!) (P) 120/28  Pulse:   64   Resp:   (!) 22 (P) 19  Temp:   98.4 F (36.9 C)   TempSrc:   Oral (P) Oral  SpO2:  96%     Weight: 108.1 kg     Height:        Intake/Output Summary (Last 24 hours) at 02/28/2021 1357 Last data filed at 02/28/2021 1054 Gross per 24 hour  Intake 1711.41 ml  Output --  Net 1711.41 ml    Filed Weights   02/26/21 0428 02/26/21 0449 02/28/21 0500  Weight: 106.7 kg 106.7 kg 108.1 kg    Examination: Gen Exam:Alert awake-not in any distress HEENT:atraumatic, normocephalic Chest: B/L clear to auscultation anteriorly CVS:S1S2 regular Abdomen:soft non tender, non distended Extremities:no edema.  Right transmetatarsal amputation-amputation of numerous left toes on the left foot as well. Neurology: Non focal Skin: no rash    Data Reviewed: I have personally reviewed following labs and imaging studies  CBC: Recent Labs  Lab 02/23/21 1052 03/07/2021 0745 02/27/21 0117 02/27/21 2336 02/28/21 0705  WBC 12.7* 10.6* 10.7* 12.8* 12.3*  HGB 8.2* 8.3* 9.2* 8.7* 8.4*  HCT 26.9* 27.8* 30.7* 29.1* 28.0*  MCV 93.7 95.2 94.2 95.4 96.2  PLT 228 222 208 216 0000000    Basic Metabolic Panel: Recent Labs  Lab 02/22/21 0730 02/23/21 1051 02/21/2021 0745 02/27/21 0117  NA  --  135 135 134*  K  --  3.6 3.4* 3.5  CL  --  100 99 100  CO2  --  '22 25 26  '$ GLUCOSE  --  214* 182* 145*  BUN  --  46* 31* 24*  CREATININE  --  5.75* 4.70* 4.18*  CALCIUM  --  7.7* 8.1* 8.0*  MG 1.6*  --   --   --   PHOS  --  5.6* 4.2 3.5    GFR: Estimated Creatinine Clearance: 21.7 mL/min (A) (by C-G formula based on SCr of 4.18 mg/dL (H)). Liver Function Tests: Recent Labs  Lab 02/23/21 1051 02/18/2021 0745 02/27/21 0117  ALBUMIN 1.6* 1.6* 1.6*    No results for input(s): LIPASE, AMYLASE in the last 168 hours. No results for input(s): AMMONIA in the last 168 hours. Coagulation Profile: No results for input(s): INR, PROTIME in the last 168 hours. Cardiac Enzymes: No results for input(s): CKTOTAL, CKMB, CKMBINDEX, TROPONINI in the last 168 hours. BNP (last 3 results) No results for input(s):  PROBNP in the last 8760 hours. HbA1C: No results for input(s): HGBA1C in the last 72 hours. CBG: Recent Labs  Lab 02/27/21 1233 02/27/21 1723 02/27/21 2112 02/28/21 0813 02/28/21 1228  GLUCAP 205* 240* 240* 237* 246*    Lipid Profile: No results for input(s): CHOL, HDL, LDLCALC, TRIG, CHOLHDL, LDLDIRECT in the last 72 hours. Thyroid Function Tests: No results for input(s): TSH, T4TOTAL, FREET4, T3FREE, THYROIDAB in the last 72 hours. Anemia Panel: No results for input(s): VITAMINB12, FOLATE, FERRITIN, TIBC, IRON, RETICCTPCT in the last 72 hours. Sepsis Labs: No results for input(s): PROCALCITON, LATICACIDVEN in the last 168 hours.   No results found for this or any previous visit (from the past 240 hour(s)).         Radiology Studies: No results found.      Scheduled Meds:  (feeding supplement) PROSource Plus  30 mL Oral BID BM   apixaban  5 mg Oral BID   atorvastatin  40 mg Oral QPM   chlorhexidine  15 mL Mouth  Rinse BID   Chlorhexidine Gluconate Cloth  6 each Topical Q0600   cholestyramine light  4 g Oral BID   clopidogrel  75 mg Oral Daily   darbepoetin (ARANESP) injection - DIALYSIS  200 mcg Intravenous Q Wed-HD   feeding supplement (NEPRO CARB STEADY)  237 mL Oral Q24H   fentaNYL  1 patch Transdermal Q72H   ferric citrate  420 mg Oral BID WC   Gerhardt's butt cream   Topical TID   insulin aspart  0-5 Units Subcutaneous QHS   insulin aspart  0-9 Units Subcutaneous TID WC   insulin glargine-yfgn  5 Units Subcutaneous QHS   latanoprost  1 drop Both Eyes QHS   levothyroxine  25 mcg Oral QAC breakfast   mouth rinse  15 mL Mouth Rinse q12n4p   metoprolol succinate  12.5 mg Oral Daily   midodrine  10 mg Oral TID WC   multivitamin  1 tablet Oral QHS   pantoprazole  40 mg Oral Q0600   polyethylene glycol  17 g Oral Daily   pregabalin  25 mg Oral BID   saccharomyces boulardii  250 mg Oral BID   Continuous Infusions:  amiodarone     ceFEPime (MAXIPIME) IV  Stopped (02/27/21 1823)   lactated ringers Stopped (02/21/21 1532)     LOS: 21 days    Time spent: 35  minutes.     Oren Binet, MD [facility]   If 7PM-7AM, please contact night-coverage [email or web address]  02/28/2021, 1:57 PM

## 2021-03-01 ENCOUNTER — Inpatient Hospital Stay (HOSPITAL_COMMUNITY): Payer: Medicare HMO

## 2021-03-01 ENCOUNTER — Encounter (HOSPITAL_COMMUNITY): Payer: Self-pay | Admitting: Family Medicine

## 2021-03-01 DIAGNOSIS — J9601 Acute respiratory failure with hypoxia: Secondary | ICD-10-CM | POA: Diagnosis not present

## 2021-03-01 DIAGNOSIS — I2510 Atherosclerotic heart disease of native coronary artery without angina pectoris: Secondary | ICD-10-CM | POA: Diagnosis not present

## 2021-03-01 DIAGNOSIS — Z66 Do not resuscitate: Secondary | ICD-10-CM | POA: Diagnosis not present

## 2021-03-01 DIAGNOSIS — E1151 Type 2 diabetes mellitus with diabetic peripheral angiopathy without gangrene: Secondary | ICD-10-CM | POA: Diagnosis not present

## 2021-03-01 LAB — RENAL FUNCTION PANEL
Albumin: 2.4 g/dL — ABNORMAL LOW (ref 3.5–5.0)
Anion gap: 11 (ref 5–15)
BUN: 33 mg/dL — ABNORMAL HIGH (ref 8–23)
CO2: 25 mmol/L (ref 22–32)
Calcium: 8.2 mg/dL — ABNORMAL LOW (ref 8.9–10.3)
Chloride: 100 mmol/L (ref 98–111)
Creatinine, Ser: 4.9 mg/dL — ABNORMAL HIGH (ref 0.61–1.24)
GFR, Estimated: 12 mL/min — ABNORMAL LOW (ref >60)
Glucose, Bld: 190 mg/dL — ABNORMAL HIGH (ref 70–99)
Phosphorus: 3.9 mg/dL (ref 2.5–4.6)
Potassium: 3.5 mmol/L (ref 3.5–5.1)
Sodium: 136 mmol/L (ref 135–145)

## 2021-03-01 LAB — CBC
HCT: 27.8 % — ABNORMAL LOW (ref 39.0–52.0)
Hemoglobin: 8.1 g/dL — ABNORMAL LOW (ref 13.0–17.0)
MCH: 27.9 pg (ref 26.0–34.0)
MCHC: 29.1 g/dL — ABNORMAL LOW (ref 30.0–36.0)
MCV: 95.9 fL (ref 80.0–100.0)
Platelets: 180 10*3/uL (ref 150–400)
RBC: 2.9 MIL/uL — ABNORMAL LOW (ref 4.22–5.81)
RDW: 20.2 % — ABNORMAL HIGH (ref 11.5–15.5)
WBC: 13.6 10*3/uL — ABNORMAL HIGH (ref 4.0–10.5)
nRBC: 0.1 % (ref 0.0–0.2)

## 2021-03-01 LAB — GLUCOSE, CAPILLARY
Glucose-Capillary: 214 mg/dL — ABNORMAL HIGH (ref 70–99)
Glucose-Capillary: 233 mg/dL — ABNORMAL HIGH (ref 70–99)

## 2021-03-01 LAB — MAGNESIUM: Magnesium: 1.7 mg/dL (ref 1.7–2.4)

## 2021-03-01 LAB — MRSA NEXT GEN BY PCR, NASAL: MRSA by PCR Next Gen: NOT DETECTED

## 2021-03-01 IMAGING — DX DG CHEST 1V PORT
1 series · 1 of 1 positions shown · non-contrast
Comparison: [DATE]

CLINICAL DATA: Shortness of breath, cough, congestion, end-stage
renal disease, diabetes

EXAM:
PORTABLE CHEST 1 VIEW

[chest]
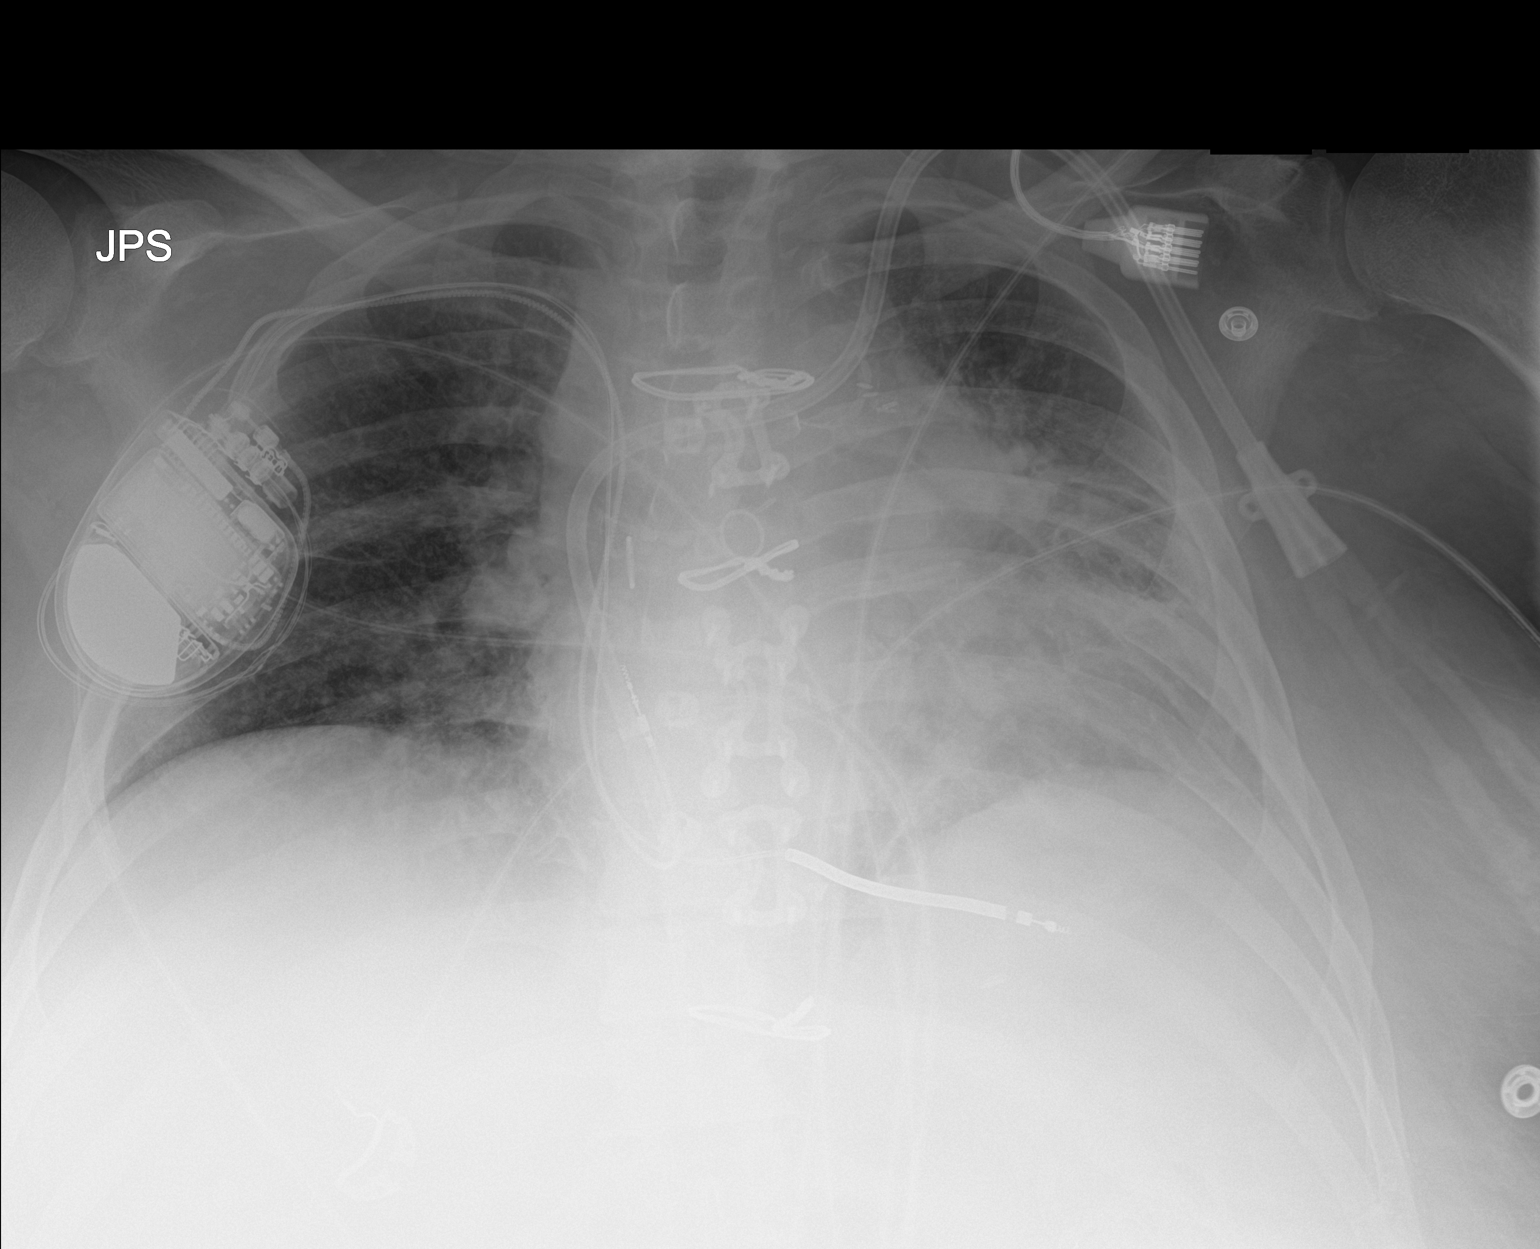

[1 of 1 positions shown; findings below may reference images not displayed]

FINDINGS: Unchanged cardiomegaly and pulmonary vascular congestion. Interval
development of left perihilar airspace opacity

Left IJ dialysis catheter unchanged in position. Right chest wall
AICD unchanged in position. Sternotomy fixation plate again seen.
IMPRESSION: Interval development left perihilar airspace opacity. Attention on
follow-up recommended to exclude developing pneumonia.

## 2021-03-01 MED ORDER — ONDANSETRON HCL 4 MG/2ML IJ SOLN: 4.0000 mg | Freq: Four times a day (QID) | INTRAMUSCULAR | Status: DC | PRN

## 2021-03-01 MED ORDER — HYDROMORPHONE HCL 1 MG/ML IJ SOLN
0.5000 mg | INTRAMUSCULAR | Status: DC | PRN
  Administered 2021-03-01: 0.5 mg via INTRAVENOUS
  Filled 2021-03-01: qty 0.5

## 2021-03-01 MED ORDER — PROCHLORPERAZINE EDISYLATE 10 MG/2ML IJ SOLN: 5.0000 mg | INTRAMUSCULAR | Status: DC | PRN

## 2021-03-01 MED ORDER — ACETYLCYSTEINE 20 % IN SOLN
4.0000 mL | RESPIRATORY_TRACT | Status: DC
  Administered 2021-03-01 (×2): 4 mL via RESPIRATORY_TRACT
  Filled 2021-03-01 (×3): qty 4

## 2021-03-01 MED ORDER — PIPERACILLIN-TAZOBACTAM IN DEX 2-0.25 GM/50ML IV SOLN
2.2500 g | Freq: Three times a day (TID) | INTRAVENOUS | Status: DC
  Administered 2021-03-01: 2.25 g via INTRAVENOUS
  Filled 2021-03-01 (×2): qty 50

## 2021-03-01 MED ORDER — LEVALBUTEROL HCL 0.63 MG/3ML IN NEBU
0.6300 mg | INHALATION_SOLUTION | RESPIRATORY_TRACT | Status: DC
  Administered 2021-03-01 (×2): 0.63 mg via RESPIRATORY_TRACT
  Filled 2021-03-01 (×3): qty 3

## 2021-03-01 MED ORDER — HYDROMORPHONE HCL PF 10 MG/ML IJ SOLN
0.5000 mg/h | INTRAMUSCULAR | Status: DC
  Administered 2021-03-01: 0.5 mg/h via INTRAVENOUS
  Filled 2021-03-01: qty 2.5

## 2021-03-01 MED ORDER — LEVALBUTEROL HCL 1.25 MG/0.5ML IN NEBU: 1.2500 mg | INHALATION_SOLUTION | Freq: Four times a day (QID) | RESPIRATORY_TRACT | Status: DC | PRN

## 2021-03-01 MED ORDER — GUAIFENESIN ER 600 MG PO TB12: 1200.0000 mg | ORAL_TABLET | Freq: Two times a day (BID) | ORAL | Status: DC

## 2021-03-01 MED ORDER — LEVALBUTEROL HCL 0.63 MG/3ML IN NEBU
0.6300 mg | INHALATION_SOLUTION | Freq: Four times a day (QID) | RESPIRATORY_TRACT | Status: DC
  Filled 2021-03-01: qty 3

## 2021-03-01 MED ORDER — GLYCOPYRROLATE 1 MG PO TABS
1.0000 mg | ORAL_TABLET | ORAL | Status: DC | PRN
  Filled 2021-03-01: qty 1

## 2021-03-01 MED ORDER — GLYCOPYRROLATE 0.2 MG/ML IJ SOLN
0.2000 mg | INTRAMUSCULAR | Status: DC | PRN
  Filled 2021-03-01: qty 1

## 2021-03-01 MED ORDER — VANCOMYCIN HCL 2000 MG/400ML IV SOLN
2000.0000 mg | INTRAVENOUS | Status: AC
  Administered 2021-03-01: 2000 mg via INTRAVENOUS
  Filled 2021-03-01: qty 400

## 2021-03-01 MED ORDER — GLYCOPYRROLATE 0.2 MG/ML IJ SOLN
0.2000 mg | INTRAMUSCULAR | Status: DC | PRN
  Administered 2021-03-01: 0.2 mg via INTRAVENOUS

## 2021-03-10 NOTE — Progress Notes (Signed)
PT Cancellation Note  Patient Details Name: Eliot Malburg MRN: [id number] DOB: [birth year]   Cancelled Treatment:    Reason Eval/Treat Not Completed: Medical issues which prohibited therapy - pt pending transfer to ICU, not medically appropriate for PT today. Will check back tomorrow.  Stacie Glaze, PT DPT Acute Rehabilitation Services Pager [phone]  Office [phone]    Louis Matte 03-05-2021, 9:50 AM

## 2021-03-10 NOTE — Progress Notes (Signed)
RT NOTE:  RT asked to hold off on CPAP at this time. Pt possibly transferring to ICU. RN will call RT is things change.

## 2021-03-10 NOTE — Significant Event (Signed)
Rapid Response Event Note   Reason for Call :  Increased secretions and increased O2 needs  Initial Focused Assessment:  Patient is alert and oriented.  He is fatigued and moderate labored breathing.  Lung sounds with rhonchi.  Audible rhonchi as well.  He has a weak cough.  BP   86/63 HR 118 RR 22-26 O2 sats 95% on 6L [facility]  Dr Sloan Leiter at bedside to assess patient   Interventions:  NTS Dr Shearon Stalls and Velna Hatchet NP (CCM) at bedside  Goals of care conversation Repositioned: Chair position  Plan of Care:  NTS suction Titrate O2  Per patient request no plans for intubation   ADDENDUM: 1600 patient transitioned to comfort care    Event Summary:   MD Notified: Dr Sloan Leiter Call Time: 1250 Arrival Time: [id number] End Time: [id number]  Raliegh Ip, RN

## 2021-03-10 NOTE — Death Summary Note (Signed)
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   Diagnoses  Preliminary cause of death:  Secondary Diagnoses (including complications and co-morbidities):  Active Problems:   CAD (coronary artery disease)   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 fib on Eliquis, COPD chronic hypoxic respiratory failure on 2 L of oxygen, nocturnal BiPAP, hypothyroidism recently discharged from [facility] 7/26-after he was treated for right thigh abscess-he had undergone I&D on 7/14.  He was discharged to SNF on oral antimicrobial therapy-while at SNF-he was noted to have recurrence of his right thigh abscess-hence he was admitted to the hospitalist service.  He was started on broad-spectrum antimicrobial therapy-and underwent repeat I&D on 8/11.  He was evaluated by infectious disease with recommendations to continue with cefepime for 2 weeks with HD until 8/25.  Further hospital stay was complicated by development of acute hypoxic respiratory failure due to aspiration pneumonia on 8/23-he rapidly  deteriorated-he did not wish to be intubated-and was subsequently transitioned to full comfort measures.  See below for further details.  Hospital course by problem list: Acute hypoxic respiratory failure due to aspiration pneumonia: During the latter part of his admission stay-on 8/22-patient was noted to be accumulating secretions-however on 8/23-he developed shortness of breath and worsening hypoxemia requiring around 4-5 L of oxygen.  Chest x-ray showed left-sided infiltrate.  He was very clinically evident that the patient was aspirating.  He was started on bronchodilators/empiric antibiotics-he was suctioned very frequently by the respiratory therapist-even with these measures-he slowly continued to deteriorate.  Pulmonary critical care was consulted-due to concern for his airway-subsequently further discussion was held with patient-we elected not to be placed on a ventilator if his respiratory issues continue to worsen.  Unfortunately-even with maximal medical therapy-his respiratory issues continue to worsen-after discussion with the patient-and then with the spouse over the phone-patient was transitioned to full comfort measures.     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  Patient required pain control with narcotics-however given the propensity for narcotics at times to cause him to be slightly drowsy and sedated-narcotic regimen was slowly titrated down.  Once he developed respiratory issues-narcotics were minimized further.   PAF/flutter with episodes of RVR along with episodes of SVT/AVNRT: S/p cardioversion on 8/20-post cardioversion developed AVNRT and required to be placed out of it.  Cardiology followed-he was on amiodarone infusion for spots of AVNRT.  Although he was on metoprolol-this was held due to chronic hypotension.  Cardiac CT  was negative for LA thrombus.   Chronic systolic heart failure (EF 30-35% by echo on  8/15)-s/p ICD placement: Volume status stable-this was managed predominantly with HD.  Given his chronic hypertension-he could only tolerate low-dose metoprolol intermittently.   Chronic hypotension: This was managed with midodrine.  He remained asymptomatic with this low blood pressure.     ESRD on HD TTS: Nephrology followed closely.   Anemia: Due to ESRD-worsened by acute illness-CBC was followed closely-did require 1 unit of PRBC.     History of CAD s/p CABG: Was managed with Plavix/beta-blocker/statin.   Chronic hypoxic respiratory failure/OSA: He was continued on BiPAP nightly.   Hypothyroidism: He was continued levothyroxine   DM-2 (A1c 7.2 on 8/1): CBG stable-he was managed with Lantus and SSI.   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   Debility/deconditioning: Mostly bedbound.  Per social work-does not have SNF benefits-insurance has denied LTAC.  Social work was following for SNF placement.   Obesity: Estimated body mass index is 34.2 kg/m as calculated from the following:   Height as of this encounter: '5\' 10"'$  (1.778 m).   Weight as of this encounter: 108.1 kg.    Pertinent Labs and Studies  Significant Diagnostic Studies CT FEMUR RIGHT WO CONTRAST  Result Date: 03/05/2021 CLINICAL DATA:  Right upper lateral thigh wound with drainage, altered mental status. EXAM: CT OF THE LOWER RIGHT EXTREMITY WITHOUT CONTRAST TECHNIQUE: Multidetector CT imaging of the right lower extremity was performed according to the standard protocol. COMPARISON:  12/05/2020 FINDINGS: Bones/Joint/Cartilage Considerable articular space narrowing in the right hip. No fracture or destructive bony findings. 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 Trace knee effusion. No definite  hip joint effusion. Ligaments Suboptimally assessed by CT. Muscles and Tendons Worsened thickening along the superficial fascia margin of the right anterior muscular compartment laterally with some reduction in distinctness of fat planes in the vastus lateralis muscle which could indicate and are lying myositis. Soft tissues Subcutaneous edema laterally in the upper thigh and circumferentially in the mid and distal thigh and knee region. Cutaneous wound along the anterolateral upper thigh with locules of gas tracking within a band of subcutaneous density over an approximately 13.8 cm excursion for example as shown on images 19 through 30 of series 10. Substantially improved appearance of the large mixed density collection along the vastus lateralis compared to prior, notably reduced in size and indistinctly marginated. Pelvic ascites noted. IMPRESSION: 1. Substantially reduced volume of the mixed density collection within along the right vastus lateralis compared to the 01/20/2021 exam. Locules of subcutaneous gas density track along an approximately 13 cm vertical excursion. 2. Inflammatory stranding extends down to the superficial fascia margin and there is some mild effacement of fat planes in the vastus lateralis compared to prior raising the possibility of early myositis. I am skeptical that this is leading to an overt compartment syndrome at this time given the persistence of some of the fat planes, but worsening could increase risk of compartment syndrome. 3. Trace knee effusion. 4. Degenerative loss of articular space in the right hip joint. 5. Circumferential edema around the knee and distal thigh with lateral edema along the proximal thigh, suspicious for cellulitis. 6. Pelvic ascites. Electronically Signed   By: Van Clines M.D.   On: 03/08/2021 11:08   CT CARDIAC MORPH/PULM VEIN W/CM&W/O CA SCORE  Addendum Date: 02/24/2021   ADDENDUM REPORT: 02/24/2021 09:36 EXAM: OVER-READ INTERPRETATION  CT  CHEST The following report is an over-read performed by  radiologist Dr. Alvino Blood [facility] on 02/24/2021. This over-read does not include interpretation of cardiac or coronary anatomy or pathology. The coronary CTA interpretation by the cardiologist is attached. COMPARISON:  None. FINDINGS: Limited view of the lung parenchyma demonstrates bibasilar atelectasis amd trace effusions. Airways are normal. Limited view of the mediastinum demonstrates no adenopathy. Limited view of the upper abdomen demonstrates small amount of high-density material within the esophagus which would indicate oral contrast. Limited view of the skeleton and chest wall is unremarkable. IMPRESSION: 1. Moderate basilar atelectasis trace effusions. 2. New simple intraperitoneal free fluid. 3. High-density material within the esophagus suggest oral contrast. Recommend clinical correlation. These results will be called to the ordering clinician or representative by the Radiologist Assistant, and communication documented in the PACS or [employer]. Electronically Signed   By: Suzy Bouchard M.D.   On: 02/24/2021 09:36   Result Date: 02/24/2021 CLINICAL DATA:  Pre Cardioversion EXAM: Cardiac Gated CTA TECHNIQUE: The patient was scanned on a Siemens Force [id number] slice scanner. Gantry rotation speed was 250 msec with a temporal resolution of 66 msec. A prospective scan was triggered in the ascending thoracic aorta at 140 HU's Data sets were reconstructed with full mA between 35% and 75% of the R-R interval Images were reviewed using VRT, MIP and MPR modes. Double oblique images were used to measure the PV diameter and areas. The patient received 80 cc of contrast at 5 cc/sec CONTRAST:  Isovue 370 total 80 cc COMPARISON:  None FINDINGS: Calcium score not done previous CABG LIMA patent to LAD SVG patent to PDA SVG patent to OM AICD wires noted in RA/RV Mild LAE Normal RA No PFO/ASD No pericardial effusion LAA large with  Broccoli type morphology No LAA thrombus LUPV:  Ostium 14 mm LLPV:   Ostium 16.7 mm RUPV:  Ostium 16.3 mm RLPV:  Ostium 17.2 mm IMPRESSION: 1.  Mild LAE Normal RA No LAA thrombus 2.  Patent SVG;s to PDA/OM Patent LIMA to LAD 3.  No ASD/PFO 4.  Normal PV anatomy 5.  AICD wires noted in RA/RV 6.  Normal aortic root 3.2 cm Jenkins Rouge MD [facility] Electronically Signed: By: Jenkins Rouge M.D. On: 02/23/2021 18:53   DG Chest Port 1 View  Result Date: 21-Mar-2021 CLINICAL DATA:  Shortness of breath, cough, congestion, end-stage renal disease, diabetes EXAM: PORTABLE CHEST 1 VIEW COMPARISON:  02/27/2021 FINDINGS: Unchanged cardiomegaly and pulmonary vascular congestion. Interval development of left perihilar airspace opacity Left IJ dialysis catheter unchanged in position. Right chest wall AICD unchanged in position. Sternotomy fixation plate again seen. IMPRESSION: Interval development left perihilar airspace opacity. Attention on follow-up recommended to exclude developing pneumonia. Electronically Signed   By: Miachel Roux M.D.   On: 03/21/2021 09:05   DG Chest Port 1 View  Result Date: 03/08/2021 CLINICAL DATA:  Questionable sepsis. Right leg infection for 5 weeks EXAM: PORTABLE CHEST 1 VIEW COMPARISON:  01/20/2021 FINDINGS: Low volume chest with interstitial coarsening that is stable and compatible with scarring based on March 2022 chest CT. Perma catheter on the left with tip at the lower right atrium. Dual-chamber pacer leads from the right in stable position. Prior CABG. Extensive artifact from EKG leads. IMPRESSION: Chronic low volume chest with scarring. No acute finding when compared to priors. Electronically Signed   By: Monte Fantasia M.D.   On: 02/12/2021 09:45   ECHOCARDIOGRAM COMPLETE  Result Date: 02/21/2021    ECHOCARDIOGRAM REPORT   Patient Name:   TAITE MALIN  Date of Exam: 02/21/2021 Medical Rec #:  [id number]     Height:       70.0 in Accession #:    [id number]    Weight:       234.6 lb Date of  Birth:  04-06-1956     BSA:          2.234 m Patient Age:    69 years      BP:           82/49 mmHg Patient Gender: M             HR:           95 bpm. Exam Location:  Inpatient Procedure: 2D Echo, Cardiac Doppler, Color Doppler and Intracardiac            Opacification Agent Indications:    Abn EKG  History:        Patient has no prior history of Echocardiogram examinations.                 CHF, Previous Myocardial Infarction, Defibrillator, Prior CABG,                 Prior Cardiac Surgery and Abnormal ECG, Arrythmias:Atrial                 Fibrillation, Signs/Symptoms:Chest Pain; Risk Factors:Diabetes.                 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  Sonographer:    Luisa Hart RDCS Referring Phys: [id number] BELKYS A REGALADO  Sonographer Comments: Technically challenging study due to limited acoustic windows and patient is morbidly obese. Image acquisition challenging due to patient body habitus. IMPRESSIONS  1. Left ventricular ejection fraction, by estimation, is 30 to 35%. The left ventricle has moderately decreased function. The left ventricle demonstrates global hypokinesis. There is mild left ventricular hypertrophy. Left ventricular diastolic parameters are indeterminate.  2. Right ventricular systolic function was not well visualized. The right ventricular size is normal There is a small (< 1 cm) echodensity on the RV device lead best seen on image 16.  3. The mitral valve is abnormal. Trivial mitral valve regurgitation. No evidence of mitral stenosis. Moderate mitral annular calcification.  4. The aortic valve is tricuspid. Aortic valve regurgitation is not visualized. Mild to moderate aortic valve sclerosis/calcification is present, without any evidence of aortic stenosis.  5. The inferior vena cava is dilated in size with <50% respiratory variability, suggesting right atrial  pressure of 15 mmHg. Comparison(s): No prior Echocardiogram. FINDINGS  Left Ventricle: Left ventricular ejection fraction, by estimation, is 30 to 35%. The left ventricle has moderately decreased function. The left ventricle demonstrates global hypokinesis. Definity contrast agent was given IV to delineate the left ventricular endocardial borders. The left ventricular internal cavity size was normal in size. There is mild left ventricular hypertrophy. Left ventricular diastolic parameters are indeterminate. Right Ventricle: The right ventricular size is normal. Right vetricular wall thickness was not well visualized. Right ventricular systolic function was not well visualized. Left Atrium: Left atrial size was normal in size. Right Atrium: Right atrial size was normal in size.  Pericardium: There is no evidence of pericardial effusion. Mitral Valve: The mitral valve is abnormal. Moderate mitral annular calcification. Trivial mitral valve regurgitation. No evidence of mitral valve stenosis. Tricuspid Valve: The tricuspid valve is normal in structure. Tricuspid valve regurgitation is trivial. Aortic Valve: The aortic valve is tricuspid. Aortic valve regurgitation is not visualized. Mild to moderate aortic valve sclerosis/calcification is present, without any evidence of aortic stenosis. Aortic valve mean gradient measures 5.0 mmHg. Aortic valve peak gradient measures 8.2 mmHg. Aortic valve area, by VTI measures 1.84 cm. Pulmonic Valve: The pulmonic valve was not well visualized. Pulmonic valve regurgitation is not visualized. No evidence of pulmonic stenosis. Aorta: The aortic root is normal in size and structure. Venous: The inferior vena cava is dilated in size with less than 50% respiratory variability, suggesting right atrial pressure of 15 mmHg. IAS/Shunts: The atrial septum is grossly normal. Additional Comments: A device lead is visualized.  LEFT VENTRICLE PLAX 2D LVIDd:         4.50 cm  Diastology LVIDs:          2.70 cm  LV e' medial:    4.60 cm/s LV PW:         1.50 cm  LV E/e' medial:  29.3 LV IVS:        1.70 cm  LV e' lateral:   6.53 cm/s LVOT diam:     2.10 cm  LV E/e' lateral: 20.7 LV SV:         40 LV SV Index:   18 LVOT Area:     3.46 cm  LEFT ATRIUM             Index       RIGHT ATRIUM           Index LA Vol (A2C):   59.3 ml 26.55 ml/m RA Area:     23.50 cm LA Vol (A4C):   66.6 ml 29.82 ml/m RA Volume:   69.50 ml  31.11 ml/m LA Biplane Vol: 64.9 ml 29.06 ml/m  AORTIC VALVE                    PULMONIC VALVE AV Area (Vmax):    1.83 cm     PV Vmax:       0.76 m/s AV Area (Vmean):   1.68 cm     PV Vmean:      53.800 cm/s AV Area (VTI):     1.84 cm     PV VTI:        0.152 m AV Vmax:           143.00 cm/s  PV Peak grad:  2.3 mmHg AV Vmean:          102.000 cm/s PV Mean grad:  1.0 mmHg AV VTI:            0.218 m AV Peak Grad:      8.2 mmHg AV Mean Grad:      5.0 mmHg LVOT Vmax:         75.50 cm/s LVOT Vmean:        49.500 cm/s LVOT VTI:          0.116 m LVOT/AV VTI ratio: 0.53  AORTA Ao Root diam: 2.90 cm Ao Asc diam:  2.50 cm MITRAL VALVE                TRICUSPID VALVE MV Area (PHT): 4.01 cm     TR Peak grad:  45.4 mmHg MV Decel Time: 189 msec     TR Vmax:        337.00 cm/s MV E velocity: 135.00 cm/s                             SHUNTS                             Systemic VTI:  0.12 m                             Systemic Diam: 2.10 cm Rudean Haskell MD Electronically signed by Rudean Haskell MD Signature Date/Time: 02/21/2021/3:39:54 PM    Final     Microbiology Recent Results (from the past 240 hour(s))  MRSA Next Gen by PCR, Nasal     Status: None   Collection Time: March 18, 2021 11:38 AM   Specimen: Nasal Mucosa; Nasal Swab  Result Value Ref Range Status   MRSA by PCR Next Gen NOT DETECTED NOT DETECTED Final    Comment: (NOTE) The GeneXpert MRSA Assay (FDA approved for NASAL specimens only), is one component of a comprehensive MRSA colonization surveillance program. It is not intended  to diagnose MRSA infection nor to guide or monitor treatment for MRSA infections. Test performance is not FDA approved in patients less than 7 years old. Performed at [facility], [place] [address], [place], [place] [address]     Lab Basic Metabolic Panel: Recent Labs  Lab 03/08/2021 0745 02/27/21 0117 March 18, 2021 0044 March 18, 2021 0054  NA 135 134* 136  --   K 3.4* 3.5 3.5  --   CL 99 100 100  --   CO2 '25 26 25  '$ --   GLUCOSE 182* 145* 190*  --   BUN 31* 24* 33*  --   CREATININE 4.70* 4.18* 4.90*  --   CALCIUM 8.1* 8.0* 8.2*  --   MG  --   --   --  1.7  PHOS 4.2 3.5 3.9  --    Liver Function Tests: Recent Labs  Lab 03/08/2021 0745 02/27/21 0117 18-Mar-2021 0044  ALBUMIN 1.6* 1.6* 2.4*   No results for input(s): LIPASE, AMYLASE in the last 168 hours. No results for input(s): AMMONIA in the last 168 hours. CBC: Recent Labs  Lab 02/14/2021 0745 02/27/21 0117 02/27/21 2336 02/28/21 0705 2021-03-18 0044  WBC 10.6* 10.7* 12.8* 12.3* 13.6*  HGB 8.3* 9.2* 8.7* 8.4* 8.1*  HCT 27.8* 30.7* 29.1* 28.0* 27.8*  MCV 95.2 94.2 95.4 96.2 95.9  PLT 222 208 216 204 180   Cardiac Enzymes: No results for input(s): CKTOTAL, CKMB, CKMBINDEX, TROPONINI in the last 168 hours. Sepsis Labs: Recent Labs  Lab 02/27/21 0117 02/27/21 2336 02/28/21 0705 2021-03-18 0044  WBC 10.7* 12.8* 12.3* 13.6*    Procedures/Operations    Nickisha Hum 03/02/2021, 3:35 PM

## 2021-03-10 NOTE — Progress Notes (Signed)
Subjective: Seen before lunch said feeling better.  Hemodialysis yesterday no UF because of low blood pressure.  Denies shortness of breath currently thinks his secretions are getting better with suction  Objective Vital signs in last 24 hours: Vitals:   2021-03-14 1100 2021-03-14 1159 03-14-2021 1200 Mar 14, 2021 1300  BP: (!) 86/55  90/61 (!) 86/63  Pulse: (!) 116  (!) 118 (!) 118  Resp: (!) 21  (!) 22 (!) 25  Temp:      TempSrc:      SpO2: 99% 98% 99% 98%  Weight:      Height:       Weight change: 0 kg  Physical Exam: General: Alert O x3 chronically ill appearing male in NAD Heart: Tacky regular No M/R/G  Lungs: coarse scattered breath non labored breathing on nasal cannula oxygen 98 %O2 sat,  Abdomen: NABS.,  Obese soft nontender nondistended but does have some abdominal wall edema Extremities: No LE edema. Drsg R thigh CDI, right TMA ,left partial TMA  Dialysis Access: LIJ TDC Drsg intact      Dialysis Orders: [facility]: MWF 4 hrs F250 450/800 3.0K/2.5 Ca LIJ TDC  EDW 111 kg -Heparin 4900 units IV initial bolus, 500 units hourly, DC last hour of tx   Assessment/Plan:   R. Thigh Abscess: s/p I&D on 8/11. Wound Vac off. Per primary. Continue cefepime with HD until 03/03/21  ESRD - On HD MWF.  Next tomorrow HD 03/14/2021 Hypotension/volume-Have attempted to challenge and lower EDW since admit. Using Midodrine with extra 10 mg PO mid run to offset intradialyic hypotension. Continue lowering volume as tolerated.  Use albumin next treatment  Has some minimal abdominal wall edema Recent AMS-slurring words. Probably related to pain meds.  Today appears normal MS Kyle Fairly, NP notified primary." Recommend decreasing Dilaudid 2 mg PO q six hours. in ESRD pt  " AFIB: This a.m. regular on exam on amiodarone, apixaban and low-dose metoprolol. Cardiology following -CT didn't show evidence of thrombus. 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 further plans per  cardiology consider EP eval  Anemia  - Hgb 9.2 > 8.4 > 8.1,noted IM reported 1 episode of rectal bleed, transfused 1 U prbc on 8/15.  Aranesp increased to 200 q Wed starting 8/17.  Plans per IM, no heparin dialysis Metabolic bone disease - Corr Ca/Phos ok. Continue Auryxia 2 tab PO BID  Nutrition -now on carb mod diet with protein supps, renal vits.  Potassium and phosphorus okay without restrictions with low albumin CM EF 30-35% ICD in place  DMT2-per primary Hypothyroidism-per primary Disposition: Pending SNF placement.   Ernest Haber, PA-C [facility] Beeper [phone] March 14, 2021,1:45 PM  LOS: 22 days   Labs: Basic Metabolic Panel: Recent Labs  Lab 03/08/2021 0745 02/27/21 0117 03/14/21 0044  NA 135 134* 136  K 3.4* 3.5 3.5  CL 99 100 100  CO2 '25 26 25  '$ GLUCOSE 182* 145* 190*  BUN 31* 24* 33*  CREATININE 4.70* 4.18* 4.90*  CALCIUM 8.1* 8.0* 8.2*  PHOS 4.2 3.5 3.9   Liver Function Tests: Recent Labs  Lab 02/12/2021 0745 02/27/21 0117 Mar 14, 2021 0044  ALBUMIN 1.6* 1.6* 2.4*   No results for input(s): LIPASE, AMYLASE in the last 168 hours. No results for input(s): AMMONIA in the last 168 hours. CBC: Recent Labs  Lab 03/05/2021 0745 02/27/21 0117 02/27/21 2336 02/28/21 0705 14-Mar-2021 0044  WBC 10.6* 10.7* 12.8* 12.3* 13.6*  HGB 8.3* 9.2* 8.7* 8.4* 8.1*  HCT  27.8* 30.7* 29.1* 28.0* 27.8*  MCV 95.2 94.2 95.4 96.2 95.9  PLT 222 208 216 204 180   Cardiac Enzymes: No results for input(s): CKTOTAL, CKMB, CKMBINDEX, TROPONINI in the last 168 hours. CBG: Recent Labs  Lab 02/28/21 1228 02/28/21 1718 02/28/21 2023 Mar 18, 2021 0729 03-18-2021 1222  GLUCAP 246* 192* 188* 214* 233*    Studies/Results: DG Chest Port 1 View  Result Date: 03/18/2021 CLINICAL DATA:  Shortness of breath, cough, congestion, end-stage renal disease, diabetes EXAM: PORTABLE CHEST 1 VIEW COMPARISON:  02/27/2021 FINDINGS: Unchanged cardiomegaly and pulmonary vascular congestion.  Interval development of left perihilar airspace opacity Left IJ dialysis catheter unchanged in position. Right chest wall AICD unchanged in position. Sternotomy fixation plate again seen. IMPRESSION: Interval development left perihilar airspace opacity. Attention on follow-up recommended to exclude developing pneumonia. Electronically Signed   By: Miachel Roux M.D.   On: 03-18-2021 09:05   Medications:  amiodarone 30 mg/hr (2021/03/18 1239)   piperacillin-tazobactam (ZOSYN)  IV     vancomycin 2,000 mg (2021-03-18 1206)    (feeding supplement) PROSource Plus  30 mL Oral BID BM   acetylcysteine  4 mL Nebulization Q4H   apixaban  5 mg Oral BID   atorvastatin  40 mg Oral QPM   chlorhexidine  15 mL Mouth Rinse BID   Chlorhexidine Gluconate Cloth  6 each Topical Q0600   cholestyramine light  4 g Oral BID   clopidogrel  75 mg Oral Daily   darbepoetin (ARANESP) injection - DIALYSIS  200 mcg Intravenous Q Wed-HD   feeding supplement (NEPRO CARB STEADY)  237 mL Oral Q24H   ferric citrate  420 mg Oral BID WC   Gerhardt's butt cream   Topical TID   guaiFENesin  1,200 mg Oral BID   insulin aspart  0-5 Units Subcutaneous QHS   insulin aspart  0-9 Units Subcutaneous TID WC   insulin glargine-yfgn  5 Units Subcutaneous QHS   latanoprost  1 drop Both Eyes QHS   levalbuterol  0.63 mg Nebulization Q4H   levothyroxine  25 mcg Oral QAC breakfast   mouth rinse  15 mL Mouth Rinse q12n4p   metoprolol succinate  12.5 mg Oral Daily   midodrine  10 mg Oral TID WC   multivitamin  1 tablet Oral QHS   pantoprazole  40 mg Oral Q0600   polyethylene glycol  17 g Oral Daily   pregabalin  25 mg Oral BID   saccharomyces boulardii  250 mg Oral BID

## 2021-03-10 NOTE — Progress Notes (Signed)
   03/24/2021 0906  Assess: MEWS Score  Temp 98.8 F (37.1 C)  BP (!) 84/55  Pulse Rate (!) 113  Resp (!) 21  Level of Consciousness Alert  SpO2 97 %  O2 Device Nasal Cannula  O2 Flow Rate (L/min) 4 L/min  Assess: if the MEWS score is Yellow or Red  Were vital signs taken at a resting state? No  Focused Assessment Change from prior assessment (see assessment flowsheet)  Early Detection of Sepsis Score *See Row Information* Medium  MEWS guidelines implemented *See Row Information* Yes  Treat  MEWS Interventions Administered scheduled meds/treatments  Pain Scale 0-10  Pain Score 0  Take Vital Signs  Increase Vital Sign Frequency  Red: Q 1hr X 4 then Q 4hr X 4, if remains red, continue Q 4hrs  Escalate  MEWS: Escalate Red: discuss with charge nurse/RN and provider, consider discussing with RRT  Notify: Charge Nurse/RN  Name of Charge Nurse/RN Notified John  Date Charge Nurse/RN Notified 03-24-21  Time Charge Nurse/RN Notified 0930  Notify: Provider  Provider Name/Title Shanker  Date Provider Notified Mar 24, 2021  Time Provider Notified 0920  Notification Type Page  Notification Reason Other (Comment) (MEWs)  Provider response No new orders  Date of Provider Response 24-Mar-2021  Time of Provider Response [phone]  Document  Patient Outcome Other (Comment)  Progress note created (see row info) Yes  MD and respiratory therapy came to room Will continue to monitor

## 2021-03-10 NOTE — Progress Notes (Signed)
OT Cancellation Note  Patient Details Name: Kyle Walton MRN: [id number] DOB: Jan 23, 1956   Cancelled Treatment:    Reason Eval/Treat Not Completed: Medical issues which prohibited therapy (pending transfer to ICU, precarious respiratory and cardiac status)  [facility] 03/12/2021, 9:52 AM  Jesse Sans OTR/L Acute Rehabilitation Services Pager: [phone] Office: [phone]

## 2021-03-10 NOTE — Progress Notes (Addendum)
PROGRESS NOTE    Kyle Walton  [id number] DOB: 1958-09-11 DOA: 02/14/2021 PCP: Patient, No Pcp Per (Inactive)   Brief Narrative: 65 chronically ill with ESRD, anemia of chronic disease, type 2 diabetes, chronic diastolic heart failure, paroxysmal A. fib on Eliquis, COPD chronic hypoxic respiratory failure on 2 L of oxygen, nocturnal BiPAP, hypothyroidism recently discharged from [facility] 7/26-after he was treated for right thigh abscess-he had undergone I&D on 7/14.  He was discharged to SNF on oral antimicrobial therapy-while at SNF-he was noted to have recurrence of his right thigh abscess-hence he was admitted to the hospitalist service.  He was started on broad-spectrum antimicrobial therapy-and underwent repeat I&D on 8/11.  He was evaluated by infectious disease with recommendations to continue with cefepime for 2 weeks with HD until 8/25.  Further hospital stay was complicated by development of acute hypoxic respiratory failure due to aspiration pneumonia.  See below for further details.  Subjective: Developed shortness of breath yesterday evening-and started to accumulate secretions.  On 4 L of oxygen this morning.  Continues to accumulate secretions-NTS performed by RT numerous times this morning with return of significant amount of secretions.  Assessment & Plan: Acute hypoxic respiratory failure due to aspiration pneumonia: Patient is very frail-and significantly debilitated-unfortunately has been lying mostly flat (per his own preference)-he now has started developing shortness of breath-and accumulating secretions.  Chest x-ray shows left-sided infiltrate-suspect he has significant amount of mucus plugging going on as well.  Although awake and alert-and able to talk-he has a very weak cough.  Unfortunately even with Mucinex/Mucomyst nebs/bronchodilators and empiric antimicrobial therapy not much improvement.  He is not able to use the incentive spirometry/flutter valve  much-due to poor effort.  Respiratory therapist has nasotracheal suctioned him numerous times today-and placed him on a vibrator vest as well.  Despite these measures-no significant clinical improvement-hence PCCM was consulted-underwent more nasotracheal suctioning by PCCM.  Discussions were held further with patient-given his clinical trajectory and risk for further worsening requiring intubation/ventilator dependence.  When PCCM MD-Dr. Shearon Stalls inquired whether patient would want ventilator/CPR if he continued to worsen-patient clearly mentioned that he did not want to be placed on a ventilator.  I subsequently called his spouse Naomi-explained above clinical situation-patient's wishes.  Patient is a DNR.  We will continue aggressive pulmonary toileting-IV antibiotics and nasotracheal suctioning.  Spouse aware that if he continues to decline in spite of maximal medical care-apart from transitioning to hospice care-really no other options.  Recurrent right thigh abscess (prior cultures on 7/14 positive for Serratia Marcescens): ID/orthopedics following-recommendations are to continue with IV antibiotics usually on cefepime-but switch to Zosyn for aspiration PNA) with HD with end date of 8/25, wound VAC removed by orthopedics on 8/16-per orthopedics-needs twice daily dressing changes.  Per Ortho-sutures need to be maintained until 3 weeks postoperative (around 9/1).  Given worsening respiratory status-narcotics were minimized over the past few days-last dose of Dilaudid was on 8/21-given his respiratory status-we will stop fentanyl patch as well.  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  CT cardiac was negative for LV thrombus.  Cardiology following and directing care.  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  Cautiously continue with beta-blockers-holding parameters in place.    Chronic systolic  heart failure (EF 30-35% by echo on 8/15)-s/p ICD placement: Volume status stable-diuresis with HD-has chronic hypotension-and can only tolerate low-dose metoprolol.  Chronic hypotension: Continue midodrine-he remains asymptomatic with  this low blood pressure.  ESRD on HD TTS: Nephrology following and directing care.  Anemia: Due to ESRD-worsened by acute illness-continue to follow CBC.  Has required PRBC transfusion.  IV iron/Aranesp defer to nephrology.   History of CAD s/p CABG: Continue Plavix/beta-blocker/statin.  Chronic hypoxic respiratory failure/OSA: Continue BiPAP nightly.  Hypothyroidism: Continue levothyroxine  DM-2 (A1c 7.2 on 8/1): CBG stable-continue Lantus 5 units daily/SSI.    Recent Labs    02/28/21 2023 03-10-21 0729 2021/03/10 1222  GLUCAP 188* 214* 233*     Legally blind  History of PAD-s/p right transmetatarsal amputation, and s/p amputation of several toes on the left foot.  Debility/deconditioning: Mostly bedbound.  Per social work-does not have SNF benefits-insurance has denied LTAC.  Social work following for SNF placement.  Obesity: Estimated body mass index is 34.2 kg/m as calculated from the following:   Height as of this encounter: '5\' 10"'$  (1.778 m).   Weight as of this encounter: 108.1 kg.   DVT prophylaxis: Eliquis Code Status: DNR Family Communication: Spouse-[name]-over the phone 8/23 Disposition Plan:  Status is: Inpatient  Remains inpatient appropriate because:IV treatments appropriate due to intensity of illness or inability to take PO  Dispo: The patient is from: SNF              Anticipated d/c is to: SNF (insurance refused LTAC)              Patient currently is not medically stable to d/c.   Difficult to place patient No   Consultants:  Ortho Nephrology Cardiology PCCM  Procedures:  I and D on 8/11 [facility] IV cardioversion 8/19  Antimicrobials:     Objective: Vitals:   2021/03/10 1100 03-10-21 1159  March 10, 2021 1200 03-10-2021 1300  BP: (!) 86/55  90/61 (!) 86/63  Pulse: (!) 116  (!) 118 (!) 118  Resp: (!) 21  (!) 22 (!) 25  Temp:      TempSrc:      SpO2: 99% 98% 99% 98%  Weight:      Height:        Intake/Output Summary (Last 24 hours) at March 10, 2021 1357 Last data filed at 02/28/2021 2250 Gross per 24 hour  Intake 240 ml  Output -516 ml  Net 756 ml    Filed Weights   02/28/21 0500 02/28/21 2140 02/28/21 2250  Weight: 108.1 kg 108.1 kg 108.1 kg    Examination: Gen Exam: In some mild distress this morning-gurgling-accumulating secretions.  Poor cough. HEENT:atraumatic, normocephalic Chest: Transmitted upper airway sounds all over- CVS:S1S2 regular Abdomen:soft non tender, non distended Extremities:no edema Neurology: Non focal-however has significant amount of generalized weakness. Skin: no rash    Data Reviewed: I have personally reviewed following labs and imaging studies  CBC: Recent Labs  Lab 02/14/2021 0745 02/27/21 0117 02/27/21 2336 02/28/21 0705 03/10/21 0044  WBC 10.6* 10.7* 12.8* 12.3* 13.6*  HGB 8.3* 9.2* 8.7* 8.4* 8.1*  HCT 27.8* 30.7* 29.1* 28.0* 27.8*  MCV 95.2 94.2 95.4 96.2 95.9  PLT 222 208 216 204 [id number]    Basic Metabolic Panel: Recent Labs  Lab 02/23/21 1051 02/23/2021 0745 02/27/21 0117 03/10/21 0044 2021/03/10 0054  NA 135 135 134* 136  --   K 3.6 3.4* 3.5 3.5  --   CL 100 99 100 100  --   CO2 '22 25 26 25  '$ --   GLUCOSE 214* 182* 145* 190*  --   BUN 46* 31* 24* 33*  --   CREATININE 5.75* 4.70* 4.18*  4.90*  --   CALCIUM 7.7* 8.1* 8.0* 8.2*  --   MG  --   --   --   --  1.7  PHOS 5.6* 4.2 3.5 3.9  --     GFR: Estimated Creatinine Clearance: 18.5 mL/min (A) (by C-G formula based on SCr of 4.9 mg/dL (H)). Liver Function Tests: Recent Labs  Lab 02/23/21 1051 02/19/2021 0745 02/27/21 0117 2021-03-06 0044  ALBUMIN 1.6* 1.6* 1.6* 2.4*    No results for input(s): LIPASE, AMYLASE in the last 168 hours. No results for input(s): AMMONIA in  the last 168 hours. Coagulation Profile: No results for input(s): INR, PROTIME in the last 168 hours. Cardiac Enzymes: No results for input(s): CKTOTAL, CKMB, CKMBINDEX, TROPONINI in the last 168 hours. BNP (last 3 results) No results for input(s): PROBNP in the last 8760 hours. HbA1C: No results for input(s): HGBA1C in the last 72 hours. CBG: Recent Labs  Lab 02/28/21 1228 02/28/21 1718 02/28/21 2023 March 06, 2021 0729 03/06/2021 1222  GLUCAP 246* 192* 188* 214* 233*    Lipid Profile: No results for input(s): CHOL, HDL, LDLCALC, TRIG, CHOLHDL, LDLDIRECT in the last 72 hours. Thyroid Function Tests: No results for input(s): TSH, T4TOTAL, FREET4, T3FREE, THYROIDAB in the last 72 hours. Anemia Panel: No results for input(s): VITAMINB12, FOLATE, FERRITIN, TIBC, IRON, RETICCTPCT in the last 72 hours. Sepsis Labs: No results for input(s): PROCALCITON, LATICACIDVEN in the last 168 hours.   Recent Results (from the past 240 hour(s))  MRSA Next Gen by PCR, Nasal     Status: None   Collection Time: 03/06/21 11:38 AM   Specimen: Nasal Mucosa; Nasal Swab  Result Value Ref Range Status   MRSA by PCR Next Gen NOT DETECTED NOT DETECTED Final    Comment: (NOTE) The GeneXpert MRSA Assay (FDA approved for NASAL specimens only), is one component of a comprehensive MRSA colonization surveillance program. It is not intended to diagnose MRSA infection nor to guide or monitor treatment for MRSA infections. Test performance is not FDA approved in patients less than 7 years old. Performed at [facility], [place] [address], [place], [place] [address]            Radiology Studies: DG Chest [place] 1 View  Result Date: 2021-03-06 CLINICAL DATA:  Shortness of breath, cough, congestion, end-stage renal disease, diabetes EXAM: PORTABLE CHEST 1 VIEW COMPARISON:  02/14/2021 FINDINGS: Unchanged cardiomegaly and pulmonary vascular congestion. Interval development of left perihilar airspace opacity  Left IJ dialysis catheter unchanged in position. Right chest wall AICD unchanged in position. Sternotomy fixation plate again seen. IMPRESSION: Interval development left perihilar airspace opacity. Attention on follow-up recommended to exclude developing pneumonia. Electronically Signed   By: Miachel Roux M.D.   On: March 06, 2021 09:05        Scheduled Meds:  (feeding supplement) PROSource Plus  30 mL Oral BID BM   acetylcysteine  4 mL Nebulization Q4H   apixaban  5 mg Oral BID   atorvastatin  40 mg Oral QPM   chlorhexidine  15 mL Mouth Rinse BID   Chlorhexidine Gluconate Cloth  6 each Topical Q0600   cholestyramine light  4 g Oral BID   clopidogrel  75 mg Oral Daily   darbepoetin (ARANESP) injection - DIALYSIS  200 mcg Intravenous Q Wed-HD   feeding supplement (NEPRO CARB STEADY)  237 mL Oral Q24H   ferric citrate  420 mg Oral BID WC   Gerhardt's butt cream   Topical TID   guaiFENesin  1,200 mg Oral BID   insulin aspart  0-5 Units Subcutaneous QHS   insulin aspart  0-9 Units Subcutaneous TID WC   insulin glargine-yfgn  5 Units Subcutaneous QHS   latanoprost  1 drop Both Eyes QHS   levalbuterol  0.63 mg Nebulization Q4H   levothyroxine  25 mcg Oral QAC breakfast   mouth rinse  15 mL Mouth Rinse q12n4p   metoprolol succinate  12.5 mg Oral Daily   midodrine  10 mg Oral TID WC   multivitamin  1 tablet Oral QHS   pantoprazole  40 mg Oral Q0600   polyethylene glycol  17 g Oral Daily   pregabalin  25 mg Oral BID   saccharomyces boulardii  250 mg Oral BID   Continuous Infusions:  amiodarone 30 mg/hr (26-Mar-2021 1239)   piperacillin-tazobactam (ZOSYN)  IV     vancomycin 2,000 mg (2021-03-26 1206)     LOS: 22 days    Time spent: 35  minutes.     Oren Binet, MD [facility]   If 7PM-7AM, please contact night-coverage [email or web address]  03/26/2021, 1:57 PM

## 2021-03-10 NOTE — Progress Notes (Signed)
Physician notified of patient status. No orders given at this time.

## 2021-03-10 NOTE — Progress Notes (Signed)
Patient has rapidly deteriorated-he is now unresponsive-and even more short of breath.  Earlier he refused further suctioning-and told RN-SLP that he wanted to be made comfortable.    I have reached out to his wife Kyle Walton a few minutes ago and I have asked her to come to the hospital right away.  I suspect patient will likely pass soon.

## 2021-03-10 NOTE — Progress Notes (Signed)
RT called to bedside to NTS patient. Patients sats around 84%. Oxygen increased to 6L [place]. Minimal secretions in return from NTS. MD and RN at bedside. CCM paged.

## 2021-03-10 NOTE — Progress Notes (Signed)
   Subjective:  Kyle Walton is a 65 y.o. male, status post irrigation and debridement of right thigh and application of wound VAC performed on 02/24/2021 by Dr. Stann Mainland.  Patient did need to have a cardioversion on 02/15/2021.    Patient reports pain as mild.  Reports some pain but overall doing okay with the leg.  Crackling breath sound noted when discussing leg, was recently seen by RT and MD for secretions/ difficulty breathing.   Objective:   VITALS:   Vitals:   03-13-21 1049 03-13-21 1100 03/13/2021 1159 03/13/2021 1200  BP: (!) 84/50 (!) 86/55  90/61  Pulse: (!) 115 (!) 116  (!) 118  Resp: (!) 24 (!) 21  (!) 22  Temp:      TempSrc:      SpO2:  99% 98% 99%  Weight:      Height:       Right lower extremity:  Healing lateral thigh incision without signs of acute infection.  No spreading erythema.  Nylon sutures present.  No significant edema at the thigh.  Small seroma noted.  No calf pain.  Neurovascularly intact.  Status post amputation of all 5 toes     Lab Results  Component Value Date   WBC 13.6 (H) 2021-03-13   HGB 8.1 (L) March 13, 2021   HCT 27.8 (L) 13-Mar-2021   MCV 95.9 2021-03-13   PLT 180 Mar 13, 2021   BMET    Component Value Date/Time   NA 136 March 13, 2021 0044   K 3.5 13-Mar-2021 0044   CL 100 2021/03/13 0044   CO2 25 03/13/2021 0044   GLUCOSE 190 (H) March 13, 2021 0044   BUN 33 (H) Mar 13, 2021 0044   CREATININE 4.90 (H) 03/13/21 0044   CALCIUM 8.2 (L) 2021-03-13 0044   GFRNONAA 12 (L) 03-13-21 0044   GFRAA 6 (L) 01/17/2020 1605     Assessment/Plan: 4 Days Post-Op   Active Problems:   CAD (coronary artery disease)   Diabetes mellitus with peripheral vascular disease ([place])   ESRD on hemodialysis (HCC)   Acquired hypothyroidism   Open thigh wound, right, sequela   Wound infection   Ischemic cardiomyopathy   Persistent atrial fibrillation (HCC)   PSVT (paroxysmal supraventricular tachycardia) ([place])    Up with therapy  Wound care: Mepilex  dressing change today after exam.  continue twice daily dressing changes with Mepilex dressing.  Overall healing well.  We will plan for suture removal around 03/10/2021  Weightbearing Status: Weightbearing as tolerated, recommend moving the hip, knee, and ankle as tolerated to maintain motion DVT Prophylaxis: Eliquis per primary/cardiology  Antibiotics per infectious disease  Ortho will follow along.  [name] 2021-03-13, 12:56 PM  Jonelle Sidle PA-C  Physician Assistant with Dr. Lillia Abed [facility]
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
## 2021-03-10 NOTE — Evaluation (Signed)
Clinical/Bedside Swallow Evaluation Patient Details  Name: Kyle Walton MRN: [id number] Date of Birth: May 08, 1956  Today's Date: 2021-03-06 Time: SLP Start Time (ACUTE ONLY): 62 SLP Stop Time (ACUTE ONLY): [id number] SLP Time Calculation (min) (ACUTE ONLY): 19 min  Past Medical History:  Past Medical History:  Diagnosis Date   Diabetes mellitus without complication (HCC)    Diastolic heart failure (HCC)    ESRD (end stage renal disease) ([place])    MI (myocardial infarction) ([place])    OSA (obstructive sleep apnea)    PAF (paroxysmal atrial fibrillation) ([place])    Past Surgical History:  Past Surgical History:  Procedure Laterality Date   APPLICATION OF WOUND VAC Right 02/20/2021   Procedure: APPLICATION OF WOUND VAC;  Surgeon: Nicholes Stairs, MD;  Location: [place];  Service: Orthopedics;  Laterality: Right;   CARDIOVERSION N/A 02/16/2021   Procedure: CARDIOVERSION;  Surgeon: Skeet Latch, MD;  Location: [place];  Service: Cardiovascular;  Laterality: N/A;   CORONARY ARTERY BYPASS GRAFT  2017   LIMA LAD, SVG PDA, OM3   DIALYSIS FISTULA CREATION     FACIAL LACERATION REPAIR  03/08/2021   Procedure: LACERATION REPAIR OF TONGUE;  Surgeon: Izora Gala, MD;  Location: [place];  Service: ENT;;   I & D EXTREMITY Right 01/20/2021   Procedure: IRRIGATION AND DEBRIDEMENT OF THIGH, APPLICATION OF WOUND [place];  Surgeon: Nicholes Stairs, MD;  Location: [place];  Service: Orthopedics;  Laterality: Right;   I & D EXTREMITY Right 02/23/2021   Procedure: IRRIGATION AND DEBRIDEMENT RIGHT THIGH;  Surgeon: Nicholes Stairs, MD;  Location: [place];  Service: Orthopedics;  Laterality: Right;   TOE AMPUTATION Bilateral    HPI:  Pt is a 65 yo male admitted from SNF with recurrent R thigh abscess s/p repeat I&D 8/11. Pt developed acute hypoxic respiratory failure on 8/22 with CXR concerning for PNA. During recent admission in July 2022, BSE appeared to be functional and regular solids/thin liquids  were recommended. He had a FEES at outside facility in December 2021 with mild oropharyngeal dysphagia but no aspiration; regular solids/thin liquids also recommended. PMH includes:  ESRD, anemia of chronic disease, type 2 diabetes, chronic diastolic heart failure, paroxysmal A. fib on Eliquis, COPD chronic hypoxic respiratory failure on 2 L of oxygen, nocturnal BiPAP, hypothyroidism   Assessment / Plan / Recommendation Clinical Impression  Pt appears to be at a heightened risk for aspiration in the setting of reduced secretion management and respiratory status, suspect altered mentation (although not sure of his baseline), and suboptimal positioning. Despite cues and physical assist he wants to lean toward the side of the bed and does not hold his head upright or at midline. He says he will not participate in oral motor exam and does not cough to command despite audible congestion noted at baseline. He consumed minimal PO trials including ice chips and water, with congested coughing noted after each trial. Pt would not take any further trials as he repeatedly told SLP that he just wanted to be made comfortable and that he wanted hospice - RN and MD notified, and per MD will consider palliative care consult. Recommend that he remain NPO for now pending further clarification of GOC. SLP will continue to follow as appropriate. SLP Visit Diagnosis: Dysphagia, unspecified (R13.10)    Aspiration Risk  Severe aspiration risk    Diet Recommendation NPO   Medication Administration: Via alternative means    Other  Recommendations Oral Care Recommendations: Oral care QID Other Recommendations:  Have oral suction available   Follow up Recommendations Skilled Nursing facility      Frequency and Duration min 2x/week  2 weeks       Prognosis Prognosis for Safe Diet Advancement: Fair Barriers to Reach Goals: Cognitive deficits;Severity of deficits      Swallow Study   General HPI: Pt is a 65 yo male  admitted from SNF with recurrent R thigh abscess s/p repeat I&D 8/11. Pt developed acute hypoxic respiratory failure on 8/22 with CXR concerning for PNA. During recent admission in July 2022, BSE appeared to be functional and regular solids/thin liquids were recommended. He had a FEES at outside facility in December 2021 with mild oropharyngeal dysphagia but no aspiration; regular solids/thin liquids also recommended. PMH includes:  ESRD, anemia of chronic disease, type 2 diabetes, chronic diastolic heart failure, paroxysmal A. fib on Eliquis, COPD chronic hypoxic respiratory failure on 2 L of oxygen, nocturnal BiPAP, hypothyroidism Type of Study: Bedside Swallow Evaluation Previous Swallow Assessment: see HPI Diet Prior to this Study: NPO Temperature Spikes Noted: No Respiratory Status: Nasal cannula History of Recent Intubation: No Behavior/Cognition: Alert;Distractible Oral Care Completed by SLP: No Oral Cavity - Dentition: Adequate natural dentition (as can be observed - could not see entire oral cavity) Self-Feeding Abilities: Total assist Patient Positioning: Postural control interferes with function Baseline Vocal Quality: Normal Volitional Cough: Cognitively unable to elicit    Oral/Motor/Sensory Function Overall Oral Motor/Sensory Function:  (not following commands to assess)   Ice Chips Ice chips: Impaired Presentation: Spoon Pharyngeal Phase Impairments: Cough - Delayed   Thin Liquid Thin Liquid: Impaired Presentation: Straw Pharyngeal  Phase Impairments: Cough - Delayed    Nectar Thick Nectar Thick Liquid: Not tested   Honey Thick Honey Thick Liquid: Not tested   Puree Puree: Not tested   Solid     Solid: Not tested      Osie Bond., M.A. [place] Pager [phone] Office [phone]  03/26/21,3:03 PM

## 2021-03-10 NOTE — Progress Notes (Signed)
Patient NTS by RT with MD and RN at bedside. Copious amount of white secretions suctioned out. MD to start nebs and CPT. RT will monitor.

## 2021-03-10 NOTE — Progress Notes (Signed)
Patient had cardiac time of death at 10-20-2218. Family present. Two nurses confirmed absence of heart and breath sounds.   45 ml of dilaudid drip wasted with Carolyne Fiscal  [place] and physician notified. Dr. Tennis Must was rounding physician.

## 2021-03-10 NOTE — Progress Notes (Signed)
Pharmacy Antibiotic Note  Kyle Walton is a 65 y.o. male admitted on 02/28/2021 with  aspiration PNA .  Pharmacy has been consulted for Zosyn dosing.  ID: Abx for R-thigh abscess, s/p I&D 7/14, 8/11. Abs adjusted for aspiration. Afeb, WBC 13.6 up. Hypotensive, tachy. Dose for ESRD   Vanc 8/1>>8/11 Cefepime 8/1>>8/23 Zosyn 8/23>>  8/10 MRSA nasal PCR: + 8/1 Bcx: Neg 7/14: R thigh: Serratia R to Ancef 7/14 MRSA PCR: , MRSA neg: SA: positive  Plan: D/c Cefepime Zosyn 2.25g IV q8hr with ESRD Aranesp q Wed.  Eliquis '5mg'$  BID.    Height: '5\' 10"'$  (177.8 cm) Weight: 108.1 kg (238 lb 5.1 oz) IBW/kg (Calculated) : 73  Temp (24hrs), Avg:99 F (37.2 C), Min:98.7 F (37.1 C), Max:99.2 F (37.3 C)  Recent Labs  Lab 02/23/21 1051 02/23/21 1052 02/12/2021 0745 02/27/21 0117 02/27/21 2336 02/28/21 0705 2021-03-21 0044  WBC  --    < > 10.6* 10.7* 12.8* 12.3* 13.6*  CREATININE 5.75*  --  4.70* 4.18*  --   --  4.90*   < > = values in this interval not displayed.    Estimated Creatinine Clearance: 18.5 mL/min (A) (by C-G formula based on SCr of 4.9 mg/dL (H)).    Allergies  Allergen Reactions   Morphine Other (See Comments)    Per son, Kyle Walton, pt becomes unresponsive/disoriented- especially when given after HD tx AND "ALLERGIC," per [facility]   Liraglutide Diarrhea and Other (See Comments)    "Phenol"...."ALLERGIC," per [facility]     Kyle Walton, PharmD, BCPS Clinical Staff Pharmacist [email or web address]  Wayland [place] 03/21/21 10:03 AM

## 2021-03-10 NOTE — Progress Notes (Signed)
Continues to rapidly deteriorate-gurgling-essentially unresponsive.  Spouse now at bedside.  She tells me that she spoke to him earlier-although he was weak-he was able to say goodbye.  We will start full comfort measures.

## 2021-03-10 NOTE — Progress Notes (Signed)
RT attempted to NTS patient. Patient states he doesn't want any of this. Patient did take neb treatment and chest vest. RN made aware. RT will monitor.

## 2021-03-10 NOTE — Progress Notes (Addendum)
Progress Note  Patient Name: Kyle Walton Date of Encounter: 2021-03-12  [facility] Cardiologist: Lauree Chandler, MD   Subjective   Reports fatigue is improved today. Also feels breathing is improved. No complaints of chest pain or palpitations.   Inpatient Medications    Scheduled Meds:  (feeding supplement) PROSource Plus  30 mL Oral BID BM   apixaban  5 mg Oral BID   atorvastatin  40 mg Oral QPM   chlorhexidine  15 mL Mouth Rinse BID   Chlorhexidine Gluconate Cloth  6 each Topical Q0600   cholestyramine light  4 g Oral BID   clopidogrel  75 mg Oral Daily   darbepoetin (ARANESP) injection - DIALYSIS  200 mcg Intravenous Q Wed-HD   feeding supplement (NEPRO CARB STEADY)  237 mL Oral Q24H   fentaNYL  1 patch Transdermal Q72H   ferric citrate  420 mg Oral BID WC   Gerhardt's butt cream   Topical TID   guaiFENesin  600 mg Oral BID   insulin aspart  0-5 Units Subcutaneous QHS   insulin aspart  0-9 Units Subcutaneous TID WC   insulin glargine-yfgn  5 Units Subcutaneous QHS   latanoprost  1 drop Both Eyes QHS   levothyroxine  25 mcg Oral QAC breakfast   mouth rinse  15 mL Mouth Rinse q12n4p   metoprolol succinate  12.5 mg Oral Daily   midodrine  10 mg Oral TID WC   multivitamin  1 tablet Oral QHS   pantoprazole  40 mg Oral Q0600   polyethylene glycol  17 g Oral Daily   pregabalin  25 mg Oral BID   saccharomyces boulardii  250 mg Oral BID   Continuous Infusions:  amiodarone 30 mg/hr (03/12/21 0111)   ceFEPime (MAXIPIME) IV 1 g (02/28/21 1723)   PRN Meds: acetaminophen **OR** acetaminophen, clonazepam, HYDROmorphone, levalbuterol, liver oil-zinc oxide, nitroGLYCERIN, polyethylene glycol, prochlorperazine   Vital Signs    Vitals:   02/28/21 2335 03-12-21 0041 2021/03/12 0400 Mar 12, 2021 0730  BP: (!) 80/50 (!) 91/51 (!) 82/53 (!) 85/59  Pulse: (!) 126 (!) 120 (!) 117 (!) 110  Resp: '20 20 17 18  '$ Temp: 99.2 F (37.3 C) 99.1 F (37.3 C) 99 F (37.2 C) 98.8 F  (37.1 C)  TempSrc: Axillary Axillary Oral Axillary  SpO2: 97% 98% 96% 100%  Weight:      Height:        Intake/Output Summary (Last 24 hours) at Mar 12, 2021 0752 Last data filed at 02/28/2021 2250 Gross per 24 hour  Intake 960 ml  Output -516 ml  Net 1476 ml   Last 3 Weights 02/28/2021 02/28/2021 02/28/2021  Weight (lbs) 238 lb 5.1 oz 238 lb 5.1 oz 238 lb 5.1 oz  Weight (kg) 108.1 kg 108.1 kg 108.1 kg      Telemetry   Predominately WCT with rates in the 110s-120s with intermittent episodes of NSR - Personally Reviewed  ECG    WCT with LBBB, rate 110 bpm - Personally Reviewed  Physical Exam   GEN: Chronically ill appearing gentleman in no acute distress.   Neck: No JVD Cardiac: tachycardic, regular rate, no murmurs, rubs, or gallops.  Respiratory: coarse upper airway sounds with scattered rhonchi. 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 Neuro:  Nonfocal  Psych: Normal affect   Labs    High Sensitivity Troponin:  No results for input(s): TROPONINIHS in the last 720 hours.  Chemistry Recent Labs  Lab 03/04/2021 0745 02/27/21 0117 March 29, 2021 0044  NA 135 134* 136  K 3.4* 3.5 3.5  CL 99 100 100  CO2 '25 26 25  '$ GLUCOSE 182* 145* 190*  BUN 31* 24* 33*  CREATININE 4.70* 4.18* 4.90*  CALCIUM 8.1* 8.0* 8.2*  ALBUMIN 1.6* 1.6* 2.4*  GFRNONAA 13* 15* 12*  ANIONGAP '11 8 11     '$ Hematology Recent Labs  Lab 02/27/21 2336 02/28/21 0705 2021-03-29 0044  WBC 12.8* 12.3* 13.6*  RBC 3.05* 2.91* 2.90*  HGB 8.7* 8.4* 8.1*  HCT 29.1* 28.0* 27.8*  MCV 95.4 96.2 95.9  MCH 28.5 28.9 27.9  MCHC 29.9* 30.0 29.1*  RDW 20.5* 20.3* 20.2*  PLT 216 204 180    BNPNo results for input(s): BNP, PROBNP in the last 168 hours.   DDimer No results for input(s): DDIMER in the last 168 hours.   Radiology    No results found.  Cardiac Studies   Echocardiogram 02/21/2021:  1. Left ventricular ejection  fraction, by estimation, is 30 to 35%. The  left ventricle has moderately decreased function. The left ventricle  demonstrates global hypokinesis. There is mild left ventricular  hypertrophy. Left ventricular diastolic  parameters are indeterminate.   2. Right ventricular systolic function was not well visualized. The right  ventricular size is normal There is a small (< 1 cm) echodensity on the RV  device lead best seen on image 16.   3. The mitral valve is abnormal. Trivial mitral valve regurgitation. No  evidence of mitral stenosis. Moderate mitral annular calcification.   4. The aortic valve is tricuspid. Aortic valve regurgitation is not  visualized. Mild to moderate aortic valve sclerosis/calcification is  present, without any evidence of aortic stenosis.   5. The inferior vena cava is dilated in size with <50% respiratory  variability, suggesting right atrial pressure of 15 mmHg.   Patient Profile     65 y.o. male with complicated PMH of CAD with history of CABG x3 (LIMA-LAD, SVG-PDA, SVG-OM2) in 2017 at [facility], chronic systolic heart failure with EF 30 to 35%, hx of paroxysmal AF, hx SVT/VT, s/p dual-chamber [employer]onic ICD, ESRD on HD, anemia of CKD, type 2 DM, paroxysmal a flutter/fibrillation, COPD, OSA on BiPAP, chronic hypoxic respiratory failure on 2LNC , hypothyroidism, currently admitted for complicated recurrent right thigh abscess requiring I&D/wound VAC application /wound care/prolonged antibiotic course, cardiology is consulted on 02/16/2021 for atrial flutter/fibrillation with RVR. He underwent a DCCV on 02/10/2021, later had recurrence of SVT.   Assessment & Plan    1. Paroxysmal A. Fib/flutter with RVR/AVNRT: per care everywhere, history of CTI and ablation at [facility] 06/2017. Noted to have SVT with aberrancy, started on amiodarone and beta blocker in 04/2018. Long pattern of no shows/cancelled appts. This admission he has undergone DCCV on 02/20/2021, however shortly returned  to AVNRT after cardioversion, and was subsequently paced out of AVNRT by Dr. Audie Box and Dr. Harrell Gave. 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 Continues to have intermittent SVT.  - Continue IV amiodarone for rhythm control - Continue Eliquis for stroke ppx - Hypotension limits AV nodal blocking agents - metoprolol has been on hold x2 days due to low BP's   2. Chronic systolic heart failure s/p ICD placement:  Echo from 02/21/21 30-35%, LV global hypokinesis, trivial MR, mild to moderate aortic sclerosis (not significantly changed from echo 09/17/2020. GDMT has been limited by hypotension and CKD.  - Continue volume management per nephrology  at [facility].  - Continue metoprolol succinate as BP will allow - Continue to monitor strict I&Os and daily weights   3. CAD with hx of CABG 2017: No anginal complaints. - Continue medical therapy Lipitor, Plavix, and metoprolol   4. Hypotension: BP remains low though overall patient is asymptomatic - Continue midodrine '10mg'$  TID   5. 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 No claudication complaints - Continue plavix and statin   Remainder of care per primary team: ESRD Right thigh abscess Hypotension Chronic hypoxic respiratory failure/COPD OSA Hypothyroidism Type 2 DM       For questions or updates, please contact [facility] Please consult [email or web address] for contact info under        Signed, Abigail Butts, PA-C  05-Mar-2021, 7:52 AM    Pt. Seen and examined. Agree with the Resident/NP/PA-C note as written. Continues to have intermittent wide complex tachycardia - likely SVT with underlying conduction delay. There are periods of sinus rhythm.  Would continue IV amiodarone - there are few options. Cardioversion is not indicated as he is in and out of rhythm and failed to maintain sinus after DCCV before. Not a candidate for ablation.   Time Spent with Patient: I have  spent a total of 25 minutes with patient reviewing hospital notes, telemetry, EKGs, labs and examining the patient as well as establishing an assessment and plan that was discussed with the patient.  > 50% of time was spent in direct patient care.  Pixie Casino, MD, [facility], [place] Director of the Advanced Lipid Disorders &  Cardiovascular Risk Reduction Clinic Diplomate of the American Board of Clinical Lipidology Attending Cardiologist  Direct Dial: [phone]  Fax: [phone]  Website:  [email or web address]

## 2021-03-10 DEATH — deceased

## 2021-03-30 ENCOUNTER — Ambulatory Visit (HOSPITAL_COMMUNITY): Payer: Medicare HMO | Admitting: Nurse Practitioner

## 2021-04-04 ENCOUNTER — Ambulatory Visit: Payer: Medicare HMO | Admitting: Diagnostic Neuroimaging
